# Patient Record
Sex: Male | Born: 1950
Health system: Southern US, Community
[De-identification: ages and names within clinical notes are randomized; demographics above are authoritative.]

## PROBLEM LIST (undated history)

## (undated) DIAGNOSIS — F1090 Alcohol use, unspecified, uncomplicated: Secondary | ICD-10-CM

## (undated) DIAGNOSIS — I493 Ventricular premature depolarization: Secondary | ICD-10-CM

## (undated) DIAGNOSIS — C3490 Malignant neoplasm of unspecified part of unspecified bronchus or lung: Secondary | ICD-10-CM

## (undated) DIAGNOSIS — J42 Unspecified chronic bronchitis: Secondary | ICD-10-CM

## (undated) DIAGNOSIS — I2510 Atherosclerotic heart disease of native coronary artery without angina pectoris: Secondary | ICD-10-CM

## (undated) DIAGNOSIS — Z87442 Personal history of urinary calculi: Secondary | ICD-10-CM

## (undated) DIAGNOSIS — E785 Hyperlipidemia, unspecified: Secondary | ICD-10-CM

## (undated) DIAGNOSIS — I10 Essential (primary) hypertension: Secondary | ICD-10-CM

## (undated) DIAGNOSIS — G43109 Migraine with aura, not intractable, without status migrainosus: Secondary | ICD-10-CM

## (undated) DIAGNOSIS — I5032 Chronic diastolic (congestive) heart failure: Secondary | ICD-10-CM

## (undated) HISTORY — DX: Chronic diastolic (congestive) heart failure: I50.32

## (undated) HISTORY — DX: Essential (primary) hypertension: I10

## (undated) HISTORY — DX: Alcohol use, unspecified, uncomplicated: F10.90

## (undated) HISTORY — DX: Atherosclerotic heart disease of native coronary artery without angina pectoris: I25.10

## (undated) HISTORY — DX: Ventricular premature depolarization: I49.3

## (undated) HISTORY — DX: Malignant neoplasm of unspecified part of unspecified bronchus or lung: C34.90

## (undated) HISTORY — DX: Hyperlipidemia, unspecified: E78.5

---

## 2001-09-19 ENCOUNTER — Ambulatory Visit (HOSPITAL_COMMUNITY): Admission: RE | Admit: 2001-09-19 | Discharge: 2001-09-19 | Payer: Self-pay | Admitting: Internal Medicine

## 2001-09-19 ENCOUNTER — Encounter: Payer: Self-pay | Admitting: Internal Medicine

## 2002-12-01 ENCOUNTER — Ambulatory Visit (HOSPITAL_COMMUNITY): Admission: RE | Admit: 2002-12-01 | Discharge: 2002-12-01 | Payer: Self-pay | Admitting: Internal Medicine

## 2002-12-02 ENCOUNTER — Encounter: Payer: Self-pay | Admitting: Internal Medicine

## 2002-12-03 ENCOUNTER — Ambulatory Visit (HOSPITAL_COMMUNITY): Admission: RE | Admit: 2002-12-03 | Discharge: 2002-12-03 | Payer: Self-pay | Admitting: Internal Medicine

## 2003-04-13 ENCOUNTER — Encounter: Payer: Self-pay | Admitting: Internal Medicine

## 2003-04-13 ENCOUNTER — Ambulatory Visit (HOSPITAL_COMMUNITY): Admission: RE | Admit: 2003-04-13 | Discharge: 2003-04-13 | Payer: Self-pay | Admitting: Internal Medicine

## 2004-04-10 ENCOUNTER — Emergency Department (HOSPITAL_COMMUNITY): Admission: EM | Admit: 2004-04-10 | Discharge: 2004-04-10 | Payer: Self-pay | Admitting: *Deleted

## 2005-08-13 ENCOUNTER — Emergency Department (HOSPITAL_COMMUNITY): Admission: EM | Admit: 2005-08-13 | Discharge: 2005-08-13 | Payer: Self-pay | Admitting: Emergency Medicine

## 2006-10-08 HISTORY — PX: OTHER SURGICAL HISTORY: SHX169

## 2007-05-14 ENCOUNTER — Ambulatory Visit (HOSPITAL_COMMUNITY): Admission: RE | Admit: 2007-05-14 | Discharge: 2007-05-14 | Payer: Self-pay | Admitting: Internal Medicine

## 2007-05-16 ENCOUNTER — Ambulatory Visit (HOSPITAL_COMMUNITY): Admission: RE | Admit: 2007-05-16 | Discharge: 2007-05-16 | Payer: Self-pay | Admitting: Internal Medicine

## 2007-05-21 ENCOUNTER — Ambulatory Visit: Payer: Self-pay | Admitting: Thoracic Surgery

## 2007-05-23 ENCOUNTER — Ambulatory Visit (HOSPITAL_COMMUNITY): Admission: RE | Admit: 2007-05-23 | Discharge: 2007-05-23 | Payer: Self-pay | Admitting: Thoracic Surgery

## 2007-05-27 ENCOUNTER — Encounter: Payer: Self-pay | Admitting: Thoracic Surgery

## 2007-05-27 ENCOUNTER — Ambulatory Visit (HOSPITAL_COMMUNITY): Admission: RE | Admit: 2007-05-27 | Discharge: 2007-05-27 | Payer: Self-pay | Admitting: Thoracic Surgery

## 2007-05-27 ENCOUNTER — Ambulatory Visit: Payer: Self-pay | Admitting: Thoracic Surgery

## 2007-05-28 ENCOUNTER — Ambulatory Visit: Payer: Self-pay | Admitting: Thoracic Surgery

## 2007-05-30 ENCOUNTER — Ambulatory Visit (HOSPITAL_COMMUNITY): Payer: Self-pay | Admitting: Oncology

## 2007-05-30 ENCOUNTER — Encounter (HOSPITAL_COMMUNITY): Admission: RE | Admit: 2007-05-30 | Discharge: 2007-06-29 | Payer: Self-pay | Admitting: Oncology

## 2007-06-19 ENCOUNTER — Ambulatory Visit (HOSPITAL_COMMUNITY): Admission: RE | Admit: 2007-06-19 | Discharge: 2007-06-19 | Payer: Self-pay | Admitting: Thoracic Surgery

## 2007-06-19 ENCOUNTER — Ambulatory Visit: Payer: Self-pay | Admitting: Thoracic Surgery

## 2007-07-04 ENCOUNTER — Ambulatory Visit: Admission: RE | Admit: 2007-07-04 | Discharge: 2007-08-05 | Payer: Self-pay | Admitting: Radiation Oncology

## 2007-07-04 ENCOUNTER — Encounter (HOSPITAL_COMMUNITY): Admission: RE | Admit: 2007-07-04 | Discharge: 2007-07-08 | Payer: Self-pay | Admitting: Oncology

## 2007-07-21 ENCOUNTER — Ambulatory Visit (HOSPITAL_COMMUNITY): Payer: Self-pay | Admitting: Oncology

## 2007-07-21 ENCOUNTER — Encounter (HOSPITAL_COMMUNITY): Admission: RE | Admit: 2007-07-21 | Discharge: 2007-08-20 | Payer: Self-pay | Admitting: Family Medicine

## 2007-08-22 ENCOUNTER — Ambulatory Visit: Admission: RE | Admit: 2007-08-22 | Discharge: 2007-10-08 | Payer: Self-pay | Admitting: Radiation Oncology

## 2007-08-25 ENCOUNTER — Ambulatory Visit (HOSPITAL_COMMUNITY): Admission: RE | Admit: 2007-08-25 | Discharge: 2007-08-25 | Payer: Self-pay | Admitting: Oncology

## 2007-08-26 ENCOUNTER — Encounter (HOSPITAL_COMMUNITY): Admission: RE | Admit: 2007-08-26 | Discharge: 2007-09-25 | Payer: Self-pay | Admitting: Oncology

## 2007-09-08 ENCOUNTER — Ambulatory Visit (HOSPITAL_COMMUNITY): Payer: Self-pay | Admitting: Oncology

## 2007-09-09 LAB — CBC WITH DIFFERENTIAL/PLATELET
Basophils Absolute: 0 10*3/uL (ref 0.0–0.1)
EOS%: 0 % (ref 0.0–7.0)
Eosinophils Absolute: 0 10*3/uL (ref 0.0–0.5)
LYMPH%: 3.2 % — ABNORMAL LOW (ref 14.0–48.0)
MCH: 32.3 pg (ref 28.0–33.4)
MCV: 93.6 fL (ref 81.6–98.0)
MONO%: 2.7 % (ref 0.0–13.0)
NEUT#: 14.9 10*3/uL — ABNORMAL HIGH (ref 1.5–6.5)
Platelets: 486 10*3/uL — ABNORMAL HIGH (ref 145–400)
RBC: 3.41 10*6/uL — ABNORMAL LOW (ref 4.20–5.71)
RDW: 22.8 % — ABNORMAL HIGH (ref 11.2–14.6)

## 2007-09-16 LAB — CBC WITH DIFFERENTIAL/PLATELET
BASO%: 0.6 % (ref 0.0–2.0)
EOS%: 0.2 % (ref 0.0–7.0)
LYMPH%: 27.2 % (ref 14.0–48.0)
MCHC: 34.8 g/dL (ref 32.0–35.9)
MCV: 93.2 fL (ref 81.6–98.0)
MONO%: 1.6 % (ref 0.0–13.0)
Platelets: 76 10*3/uL — ABNORMAL LOW (ref 145–400)
RBC: 3.03 10*6/uL — ABNORMAL LOW (ref 4.20–5.71)
RDW: 20.7 % — ABNORMAL HIGH (ref 11.2–14.6)

## 2007-09-23 LAB — CBC WITH DIFFERENTIAL/PLATELET
BASO%: 0.1 % (ref 0.0–2.0)
LYMPH%: 7.6 % — ABNORMAL LOW (ref 14.0–48.0)
MCHC: 35.4 g/dL (ref 32.0–35.9)
MONO#: 0.6 10*3/uL (ref 0.1–0.9)
NEUT#: 5.7 10*3/uL (ref 1.5–6.5)
RBC: 2.48 10*6/uL — ABNORMAL LOW (ref 4.20–5.71)
RDW: 20.4 % — ABNORMAL HIGH (ref 11.2–14.6)
WBC: 6.9 10*3/uL (ref 4.0–10.0)
lymph#: 0.5 10*3/uL — ABNORMAL LOW (ref 0.9–3.3)

## 2007-09-29 ENCOUNTER — Encounter (HOSPITAL_COMMUNITY): Admission: RE | Admit: 2007-09-29 | Discharge: 2007-10-08 | Payer: Self-pay | Admitting: Oncology

## 2007-09-30 LAB — CBC WITH DIFFERENTIAL/PLATELET
Basophils Absolute: 0 10*3/uL (ref 0.0–0.1)
HCT: 29.1 % — ABNORMAL LOW (ref 38.7–49.9)
HGB: 10.1 g/dL — ABNORMAL LOW (ref 13.0–17.1)
MONO#: 0.7 10*3/uL (ref 0.1–0.9)
NEUT%: 81.7 % — ABNORMAL HIGH (ref 40.0–75.0)
WBC: 6.9 10*3/uL (ref 4.0–10.0)
lymph#: 0.5 10*3/uL — ABNORMAL LOW (ref 0.9–3.3)

## 2007-10-07 LAB — CBC WITH DIFFERENTIAL/PLATELET
Basophils Absolute: 0 10*3/uL (ref 0.0–0.1)
EOS%: 0.2 % (ref 0.0–7.0)
HCT: 35.1 % — ABNORMAL LOW (ref 38.7–49.9)
HGB: 11.8 g/dL — ABNORMAL LOW (ref 13.0–17.1)
MCH: 32.4 pg (ref 28.0–33.4)
MCV: 96.4 fL (ref 81.6–98.0)
NEUT%: 82.5 % — ABNORMAL HIGH (ref 40.0–75.0)
lymph#: 0.6 10*3/uL — ABNORMAL LOW (ref 0.9–3.3)

## 2007-10-09 ENCOUNTER — Ambulatory Visit: Admission: RE | Admit: 2007-10-09 | Discharge: 2007-11-02 | Payer: Self-pay | Admitting: Radiation Oncology

## 2007-10-13 ENCOUNTER — Encounter (HOSPITAL_COMMUNITY): Admission: RE | Admit: 2007-10-13 | Discharge: 2007-11-12 | Payer: Self-pay | Admitting: Oncology

## 2007-10-14 LAB — CBC WITH DIFFERENTIAL/PLATELET
Basophils Absolute: 0 10*3/uL (ref 0.0–0.1)
EOS%: 0 % (ref 0.0–7.0)
HGB: 11.8 g/dL — ABNORMAL LOW (ref 13.0–17.1)
LYMPH%: 1.2 % — ABNORMAL LOW (ref 14.0–48.0)
MCH: 32.4 pg (ref 28.0–33.4)
MCV: 96.4 fL (ref 81.6–98.0)
MONO%: 1.9 % (ref 0.0–13.0)
NEUT%: 96.9 % — ABNORMAL HIGH (ref 40.0–75.0)
RDW: 20.5 % — ABNORMAL HIGH (ref 11.2–14.6)

## 2007-10-24 ENCOUNTER — Ambulatory Visit (HOSPITAL_COMMUNITY): Payer: Self-pay | Admitting: Oncology

## 2007-11-27 ENCOUNTER — Encounter (HOSPITAL_COMMUNITY): Admission: RE | Admit: 2007-11-27 | Discharge: 2007-12-27 | Payer: Self-pay | Admitting: Oncology

## 2007-12-11 ENCOUNTER — Ambulatory Visit (HOSPITAL_COMMUNITY): Payer: Self-pay | Admitting: Oncology

## 2008-01-26 ENCOUNTER — Encounter (HOSPITAL_COMMUNITY): Admission: RE | Admit: 2008-01-26 | Discharge: 2008-02-25 | Payer: Self-pay | Admitting: Oncology

## 2008-01-26 ENCOUNTER — Ambulatory Visit (HOSPITAL_COMMUNITY): Payer: Self-pay | Admitting: Oncology

## 2008-02-06 ENCOUNTER — Ambulatory Visit: Admission: RE | Admit: 2008-02-06 | Discharge: 2008-04-20 | Payer: Self-pay | Admitting: Radiation Oncology

## 2008-02-12 ENCOUNTER — Ambulatory Visit (HOSPITAL_COMMUNITY): Admission: RE | Admit: 2008-02-12 | Discharge: 2008-02-12 | Payer: Self-pay | Admitting: Radiation Oncology

## 2008-04-21 ENCOUNTER — Encounter (HOSPITAL_COMMUNITY): Admission: RE | Admit: 2008-04-21 | Discharge: 2008-05-21 | Payer: Self-pay | Admitting: Oncology

## 2008-04-21 ENCOUNTER — Ambulatory Visit (HOSPITAL_COMMUNITY): Payer: Self-pay | Admitting: Oncology

## 2008-06-02 ENCOUNTER — Ambulatory Visit (HOSPITAL_COMMUNITY): Admission: RE | Admit: 2008-06-02 | Discharge: 2008-06-02 | Payer: Self-pay | Admitting: Radiation Oncology

## 2008-06-08 ENCOUNTER — Ambulatory Visit (HOSPITAL_COMMUNITY): Admission: RE | Admit: 2008-06-08 | Discharge: 2008-06-08 | Payer: Self-pay | Admitting: Radiation Oncology

## 2008-06-11 ENCOUNTER — Ambulatory Visit (HOSPITAL_COMMUNITY): Payer: Self-pay | Admitting: Oncology

## 2008-07-23 ENCOUNTER — Encounter (HOSPITAL_COMMUNITY): Admission: RE | Admit: 2008-07-23 | Discharge: 2008-08-22 | Payer: Self-pay | Admitting: Oncology

## 2008-08-18 ENCOUNTER — Ambulatory Visit (HOSPITAL_COMMUNITY): Payer: Self-pay | Admitting: Oncology

## 2008-09-27 ENCOUNTER — Encounter (HOSPITAL_COMMUNITY): Admission: RE | Admit: 2008-09-27 | Discharge: 2008-10-27 | Payer: Self-pay | Admitting: Oncology

## 2008-10-13 ENCOUNTER — Ambulatory Visit (HOSPITAL_COMMUNITY): Payer: Self-pay | Admitting: Oncology

## 2008-11-08 ENCOUNTER — Encounter (HOSPITAL_COMMUNITY): Admission: RE | Admit: 2008-11-08 | Discharge: 2008-12-08 | Payer: Self-pay | Admitting: Oncology

## 2009-01-15 IMAGING — CR DG CHEST 2V
2 series · 2 of 2 positions shown · non-contrast
Comparison: CT chest 05/16/07 and chest x-ray 05/14/07.

CLINICAL DATA: Preadmission for lung mass.
 CHEST - 2 VIEW:

[view not recorded (1 of 2)]
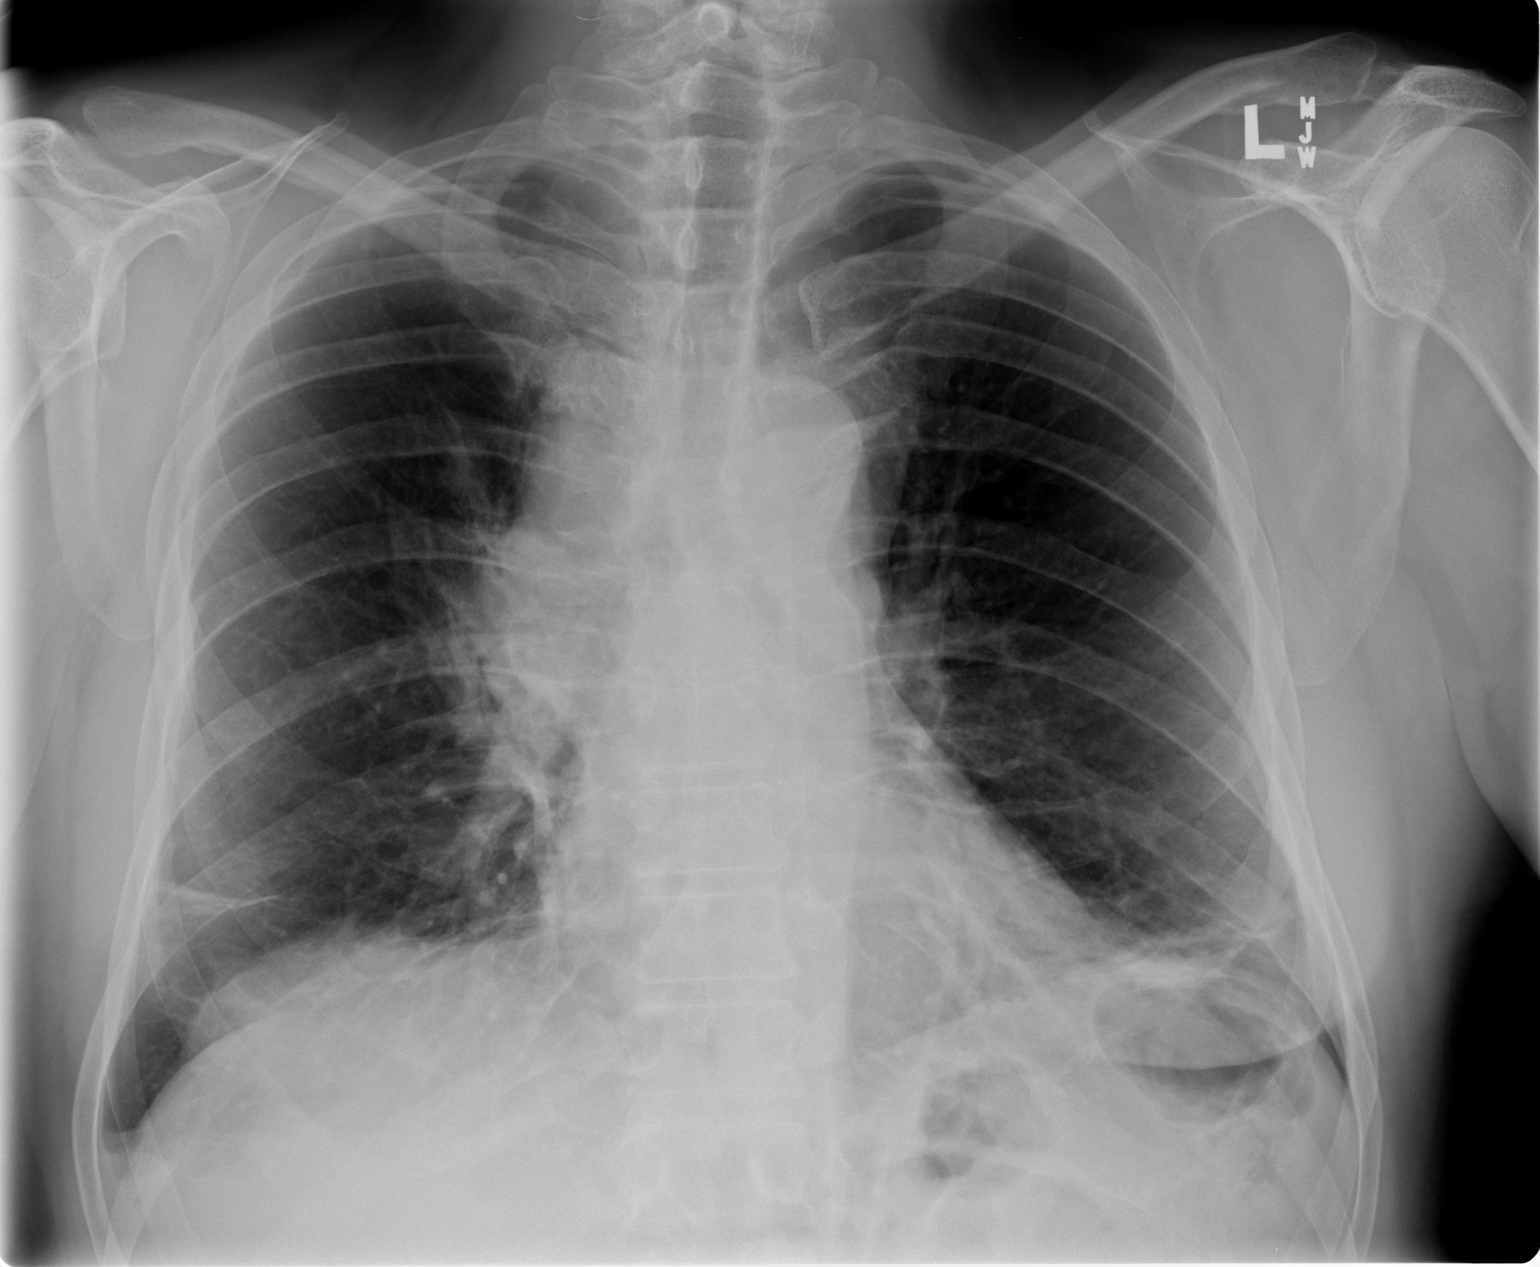

[view not recorded (2 of 2)]
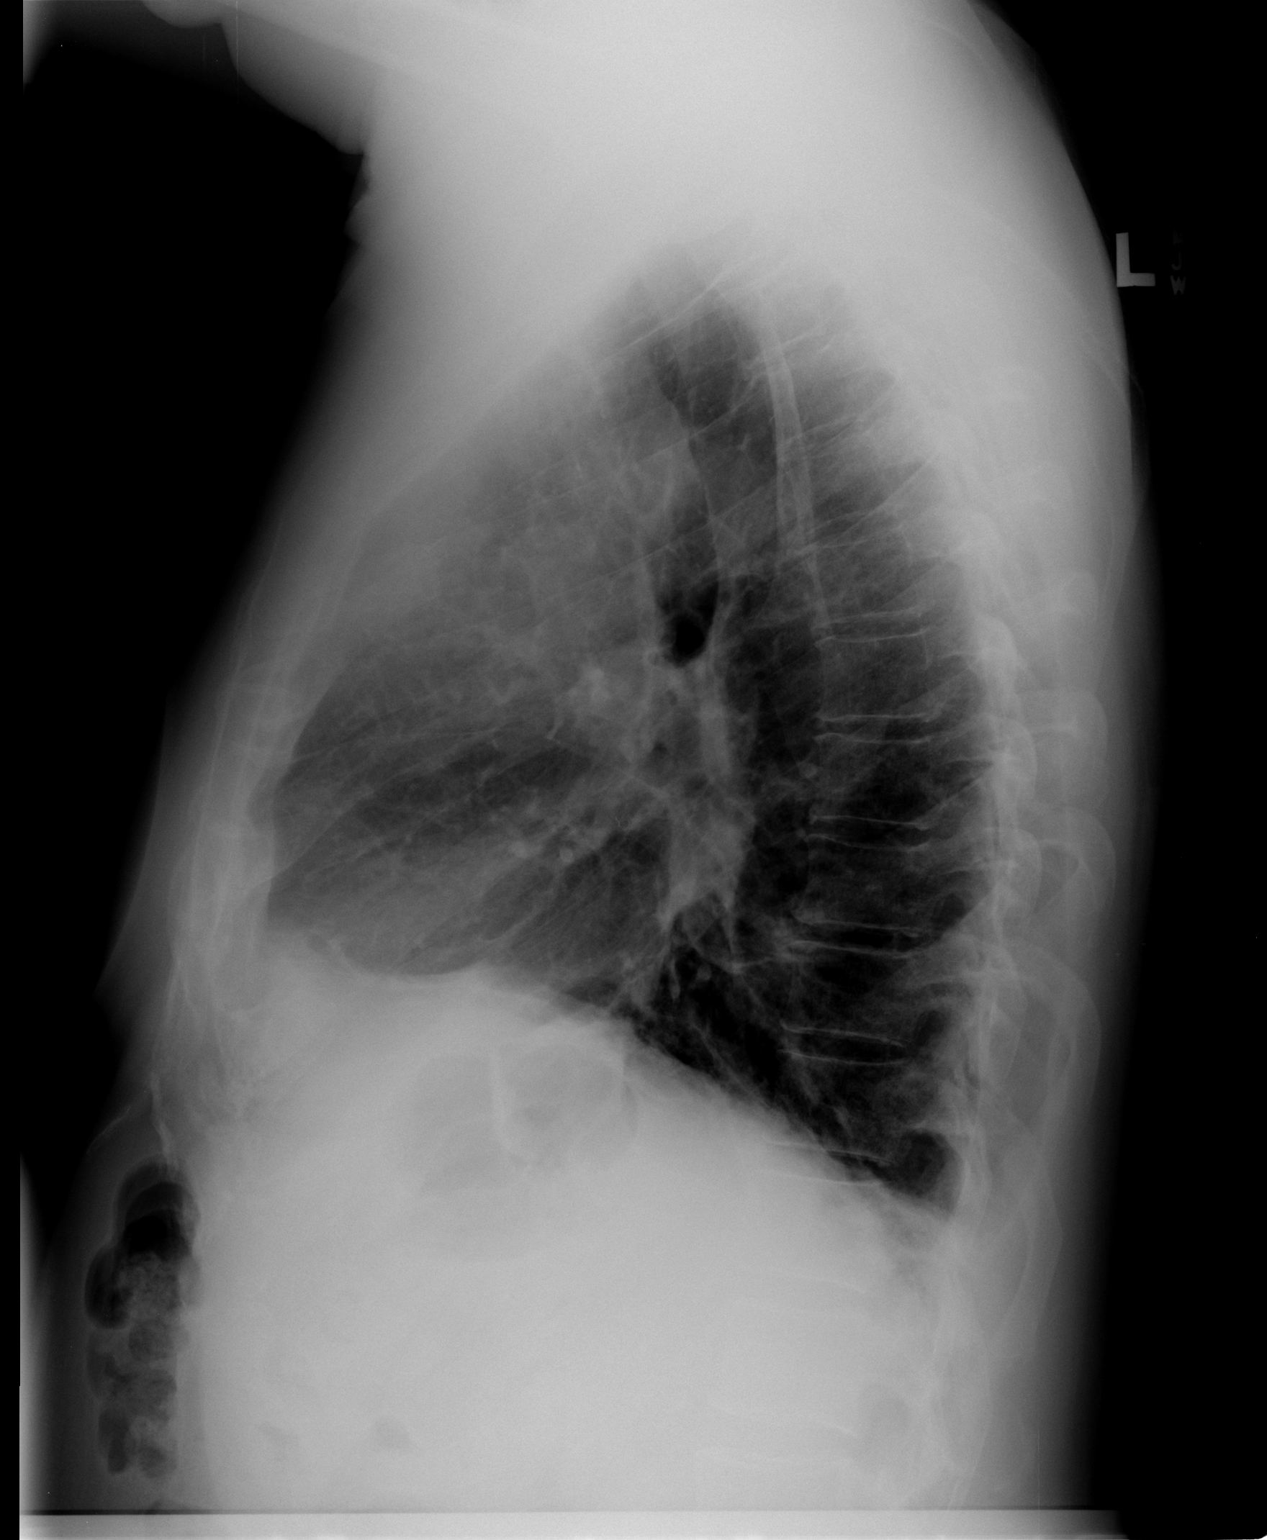

[2 of 2 positions shown; findings below may reference images not displayed]

FINDINGS: Trachea is midline. Heart size stable.  Bulky mediastinal and right hilar adenopathy are seen with a small right upper lobe nodule. Bibasilar atelectasis.
IMPRESSION: Bulky mediastinal and right hilar adenopathy with small right upper lobe nodule, most consistent with small cell carcinoma.

## 2009-02-07 IMAGING — CR DG CHEST 2V
2 series · 2 of 2 positions shown · non-contrast
Comparison: 05/26/07.

CLINICAL DATA: Lung carcinoma.  Preop respiratory exam for Port-A-Cath placement. 
 CHEST - 2 VIEW:

[view not recorded (1 of 2)]
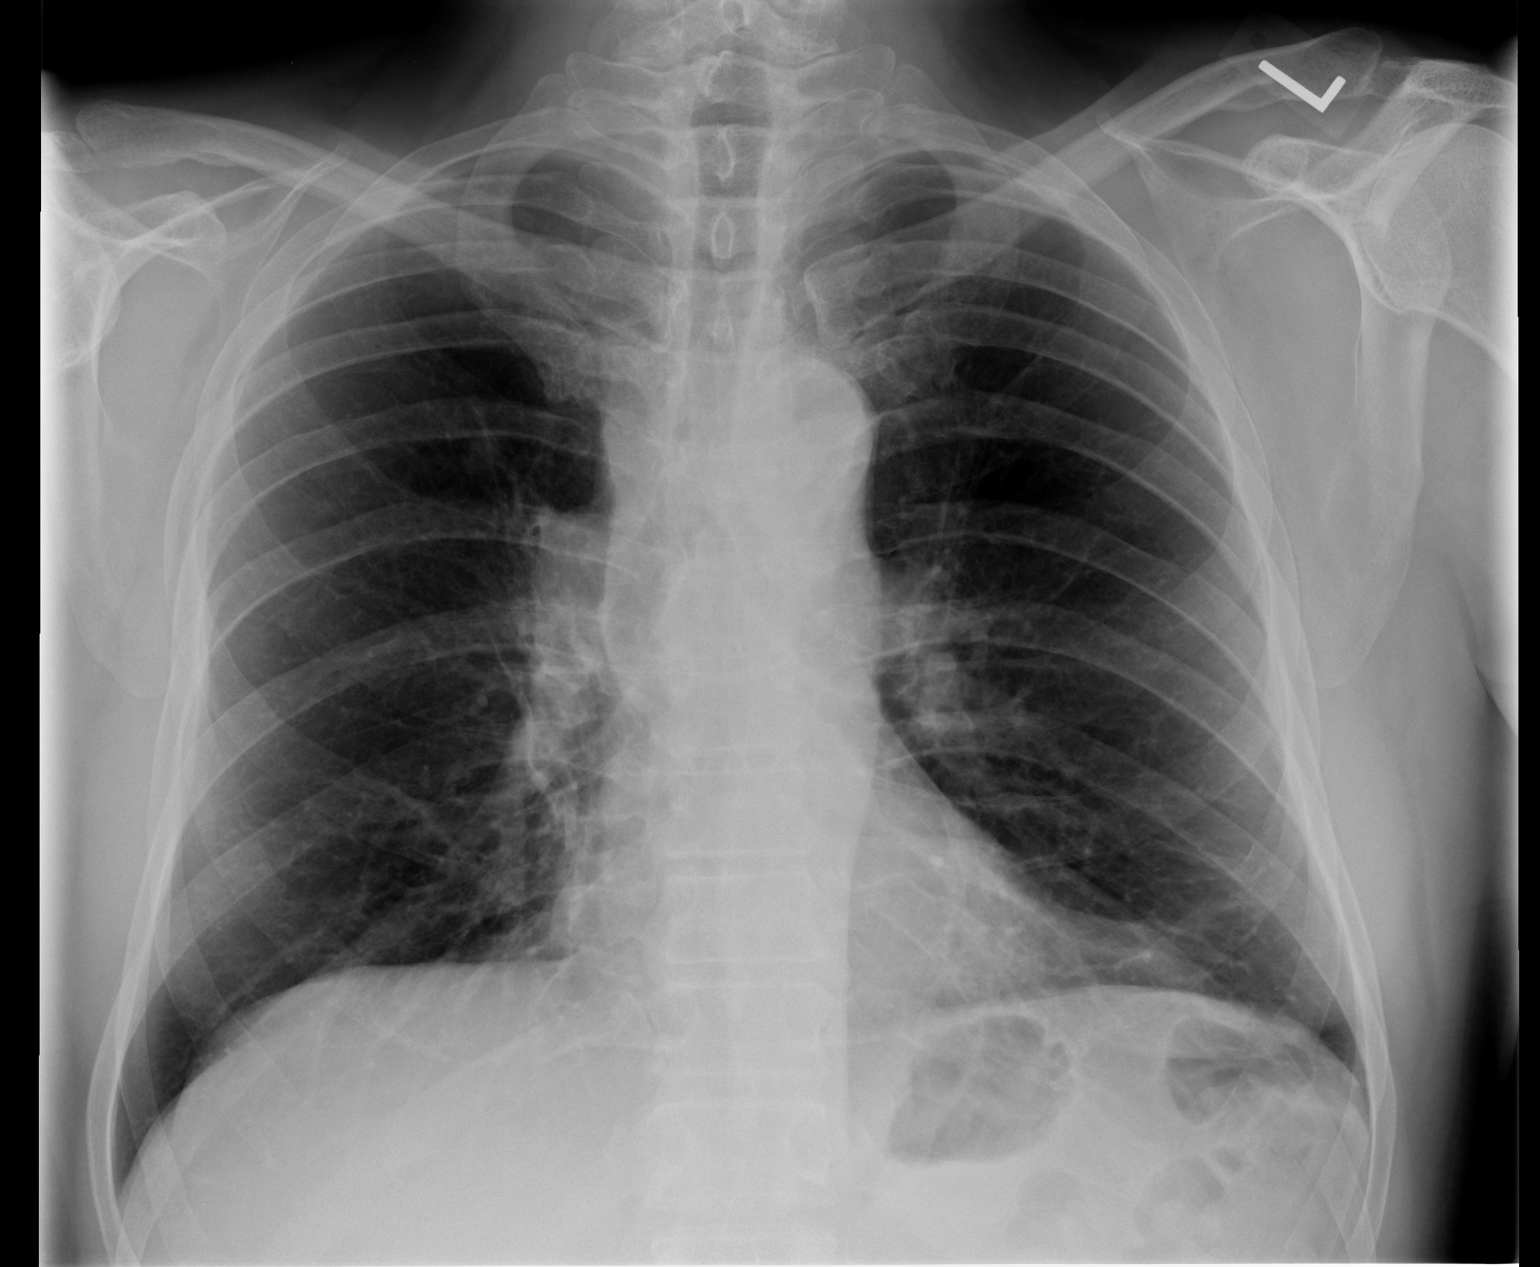

[view not recorded (2 of 2)]
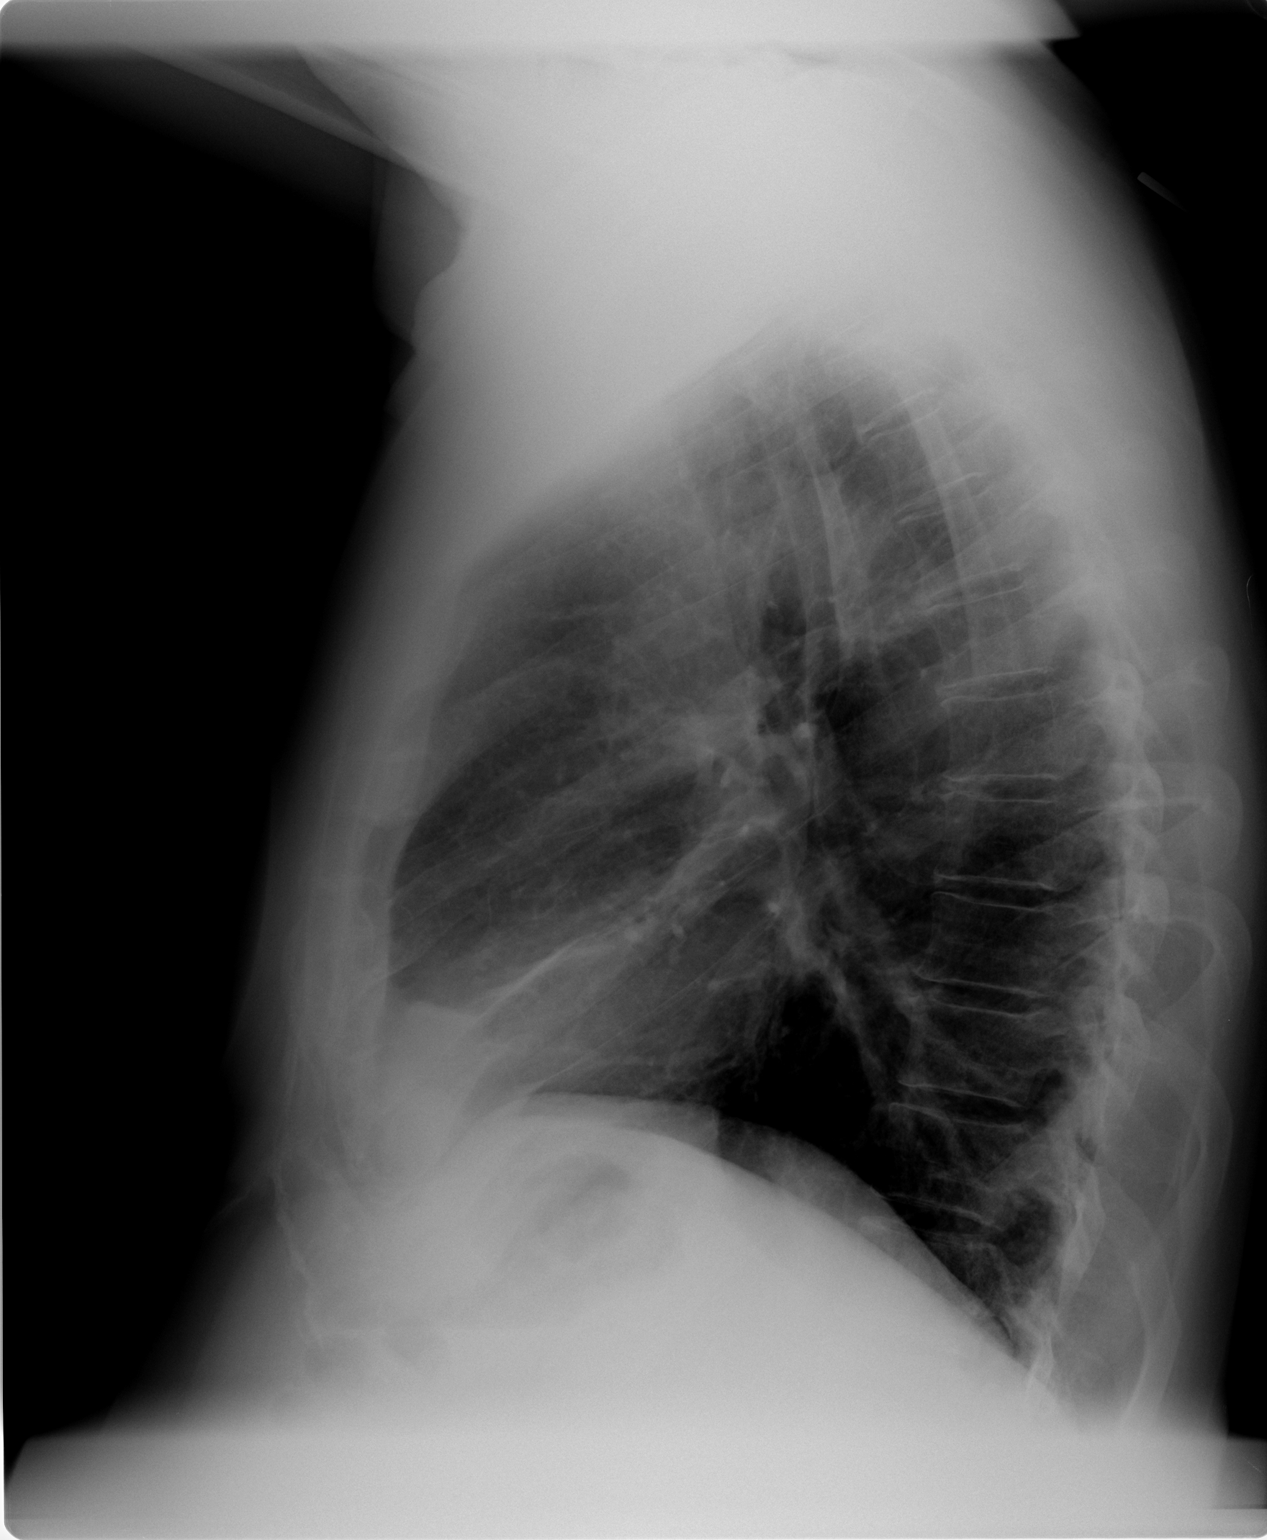

[2 of 2 positions shown; findings below may reference images not displayed]

FINDINGS: Significant decrease in right paratracheal and right hilar adenopathy is seen since prior study.  Decrease in atelectasis is seen in both lung bases.  No new or worsening areas of pulmonary opacity are seen.  There is no evidence of pleural effusion.  Heart size is normal.
IMPRESSION: 1.  No acute findings. 
 2.  Decreased right paratracheal and hilar adenopathy since prior study. 
 3.  Decreased bibasilar atelectasis.

## 2009-03-21 ENCOUNTER — Ambulatory Visit (HOSPITAL_COMMUNITY): Payer: Self-pay | Admitting: Oncology

## 2009-04-16 IMAGING — PT NM PET TUM IMG SKULL BASE T - THIGH
6 series · 25 of 25 positions shown · non-contrast
Comparison: 05/23/07.

CLINICAL DATA: Lung cancer. 
 FDG PET-CT TUMOR IMAGING (SKULL BASE TO THIGHS):
 Fasting Blood Glucose:  106.
TECHNIQUE: 16.2 mCi F-18 FDG were administered via right antecubital fossa.  Full ring PET imaging was performed from the skull base through the mid-thighs 70 minutes after injection.  CT data was obtained and used for attenuation correction and anatomic localization only.  (This was not acquired as a diagnostic CT examination.)

[Series 1: pet ac · axial · 3.3mm · 4.69mm/px · z∈[-876,-6]mm · 5 of 266 slices shown]
[im 1/266]
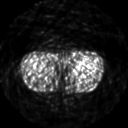
[im 67/266]
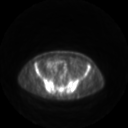
[im 133/266]
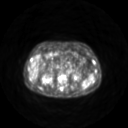
[im 199/266]
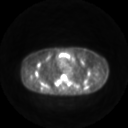
[im 266/266]
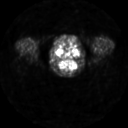

[Series 2: ct images · axial · 3.8mm · 0.98mm/px · z∈[-876,-6]mm · 6 of 267 slices shown]
[im 1/267]
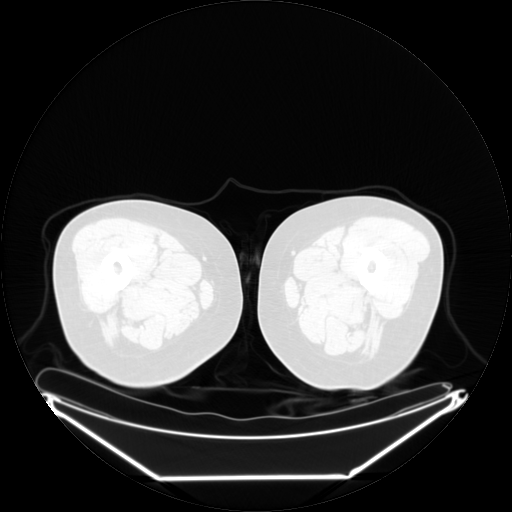
[im 54/267]
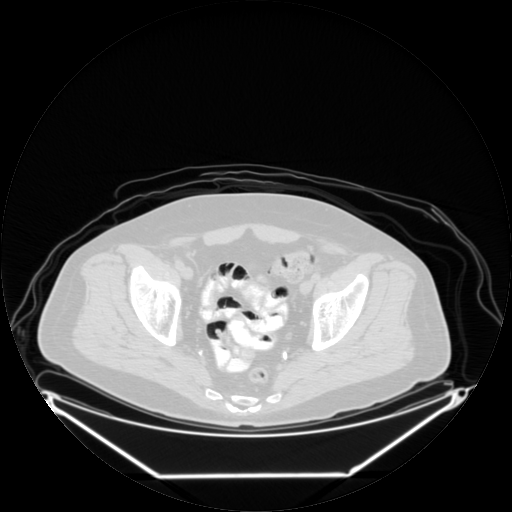
[im 107/267]
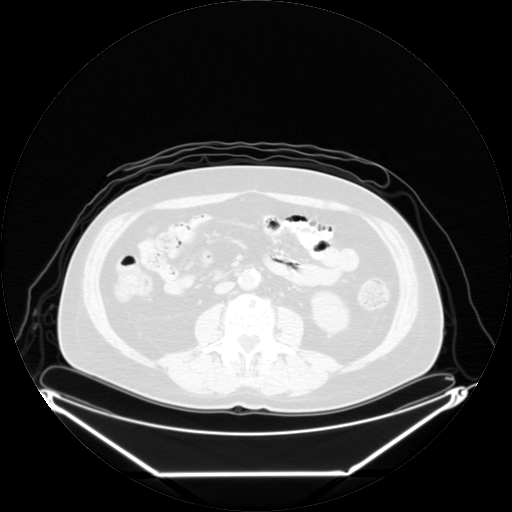
[im 160/267]
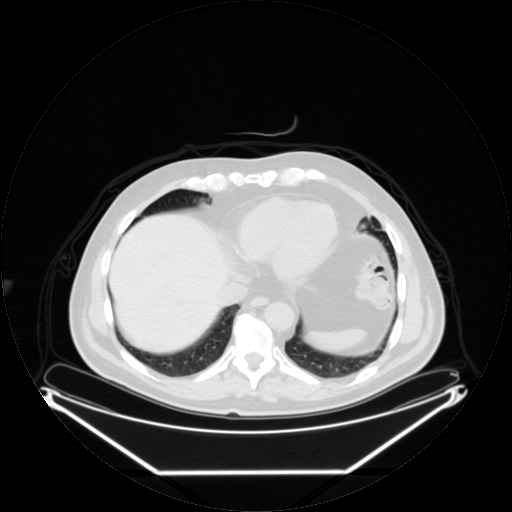
[im 213/267]
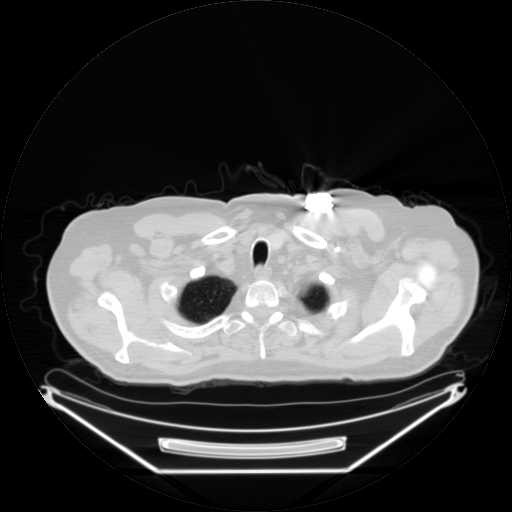
[im 267/267]
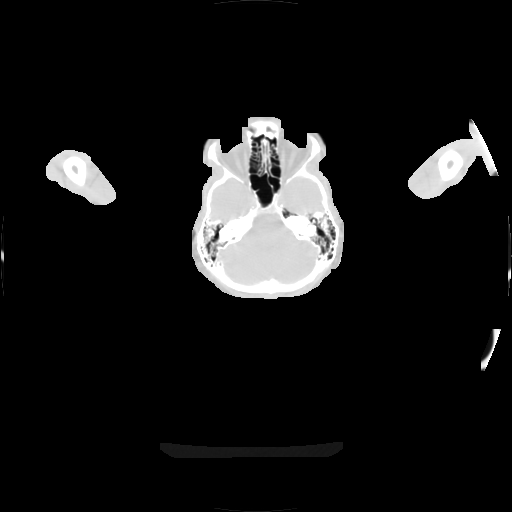

[Series 2: pet nac · axial · 3.3mm · 4.69mm/px · z∈[-876,-6]mm · 6 of 265 slices shown]
[im 1/265]
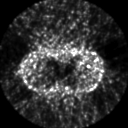
[im 53/265]
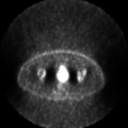
[im 106/265]
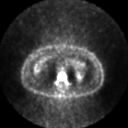
[im 159/265]
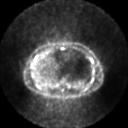
[im 212/265]
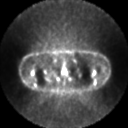
[im 265/265]
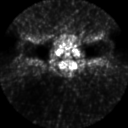

[Series 123: mip · coronal · 3.3mm · 4.69mm/px · 1 of 30 slices shown]
[im 1/30]
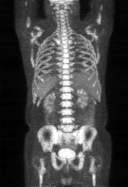

[Series 151: reformatted · axial · 3.3mm · 3.91mm/px · z∈[-876,-6]mm · 6 of 265 slices shown (1 of 2)]
[im 1/265]
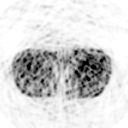
[im 53/265]
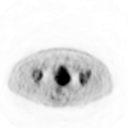
[im 106/265]
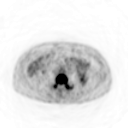
[im 159/265]
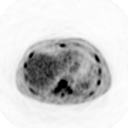
[im 212/265]
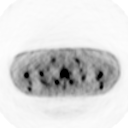
[im 265/265]
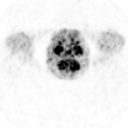

[Series 153: reformatted · coronal · 4.7mm · 6.98mm/px · 1 of 68 slices shown (2 of 2)]
[im 1/68]
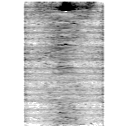

[25 of 25 positions shown; findings below may reference images not displayed]

FINDINGS: Pretracheal lymph node measures 11.7 x 17.8 mm and has an SUV max equal to 2.0 g/ml (image #74).  Previously this lymph node measured 4.4 x 3.8 cm and had an SUV max equal to 14.1 g/ml. 
 No hypermetabolic lymph nodes within the soft tissues of the neck. 
 There are no hypermetabolic right or left axillary lymph nodes.
 There are no hypermetabolic mediastinal or hilar lymph nodes.  
 No pericardial or pleural fluid is identified.  
 Right upper lobe pulmonary nodule measures 4 mm, image #70.  This is decreased from 13.7 mm previously.  The SUV max within this nodule is equal to 0.8 g/ml.  This is compared with 6.8 g/ml previously.  
 There are no new or enlarging pulmonary nodules rule out masses.
 No abnormal FDG uptake is identified within the liver parenchyma.  
 The spleen is normal. 
 There is no abnormal uptake in the adrenal glands or pancreas. 
 No hypermetabolic retroperitoneal or small bowel mesenteric lymph nodes are identified.  
 There are no hypermetabolic pelvic or inguinal lymph nodes.
 Bowel loops of the upper abdomen are unremarkable.  
 There is diffuse increased radiotracer uptake throughout the axial and appendicular skeleton which is likely treatment related.  This may decrease the sensitivity for detecting subtle areas of hypermetabolic bone metastases.
 Stable lucent lesion within the L3 vertebra may represent a small hemangioma.
IMPRESSION: 1.  There has been marked interval metabolic response to therapy from the PET CT dated 05/23/07.  Specifically, there has been near complete resolution of hypermetabolic mediastinal lymph nodes.  Only borderline enlarged mediastinal lymph nodes remain on the current exam.  The degree of FDG uptake is below background activity.  Also, residual 4 mm nodule in the right upper lobe exhibits no significant increased uptake above background activity.  
 2.  Diffuse marrow activity is likely treatment related.  Note that this may decrease our sensitivity for detecting hypermetabolic bone metastases.

## 2009-06-28 ENCOUNTER — Encounter (HOSPITAL_COMMUNITY): Admission: RE | Admit: 2009-06-28 | Discharge: 2009-07-07 | Payer: Self-pay | Admitting: Internal Medicine

## 2009-06-30 ENCOUNTER — Ambulatory Visit: Payer: Self-pay | Admitting: Cardiology

## 2009-06-30 ENCOUNTER — Encounter: Payer: Self-pay | Admitting: Adult Health

## 2009-06-30 ENCOUNTER — Observation Stay (HOSPITAL_COMMUNITY): Admission: EM | Admit: 2009-06-30 | Discharge: 2009-07-01 | Payer: Self-pay | Admitting: Emergency Medicine

## 2009-06-30 LAB — CONVERTED CEMR LAB
BUN: 14 mg/dL
CK-MB: <1 ng/mL
CO2: 27 meq/L
Creatinine, Ser: 1.13 mg/dL
Glucose, Bld: 105 mg/dL
Hemoglobin: 13.9 g/dL
Platelets: 322 10*3/uL
WBC: 5.8 10*3/uL

## 2009-07-01 ENCOUNTER — Encounter: Payer: Self-pay | Admitting: Adult Health

## 2009-07-01 LAB — CONVERTED CEMR LAB
Chloride: 103 meq/L
Creatinine, Ser: 1.2 mg/dL
HDL: 34 mg/dL
LDL Cholesterol: 144 mg/dL
Platelets: 317 10*3/uL
Troponin I: <0.05 ng/mL
WBC: 7.3 10*3/uL

## 2009-07-08 DIAGNOSIS — I10 Essential (primary) hypertension: Secondary | ICD-10-CM

## 2009-07-13 ENCOUNTER — Encounter: Payer: Self-pay | Admitting: Adult Health

## 2009-07-13 ENCOUNTER — Ambulatory Visit: Payer: Self-pay | Admitting: Cardiology

## 2009-07-27 ENCOUNTER — Encounter (HOSPITAL_COMMUNITY): Admission: RE | Admit: 2009-07-27 | Discharge: 2009-08-26 | Payer: Self-pay | Admitting: Oncology

## 2009-07-27 ENCOUNTER — Ambulatory Visit (HOSPITAL_COMMUNITY): Payer: Self-pay | Admitting: Oncology

## 2009-08-16 ENCOUNTER — Encounter: Payer: Self-pay | Admitting: Adult Health

## 2009-08-16 ENCOUNTER — Ambulatory Visit: Payer: Self-pay | Admitting: Cardiology

## 2009-08-16 DIAGNOSIS — E782 Mixed hyperlipidemia: Secondary | ICD-10-CM

## 2009-09-15 ENCOUNTER — Encounter (INDEPENDENT_AMBULATORY_CARE_PROVIDER_SITE_OTHER): Payer: Self-pay | Admitting: *Deleted

## 2009-09-15 ENCOUNTER — Encounter: Payer: Self-pay | Admitting: Adult Health

## 2009-09-15 LAB — CONVERTED CEMR LAB
ALT: 11 units/L
AST: 11 units/L
AST: 11 units/L
Alkaline Phosphatase: 81 units/L
Alkaline Phosphatase: 81 units/L (ref 39–117)
Bilirubin, Direct: 0.1 mg/dL
Bilirubin, Direct: 0.1 mg/dL (ref 0.0–0.3)
HDL: 41 mg/dL
HDL: 41 mg/dL
Indirect Bilirubin: 0.4 mg/dL (ref 0.0–0.9)
LDL Cholesterol: 40 mg/dL
LDL Cholesterol: 40 mg/dL
LDL Cholesterol: 40 mg/dL (ref 0–99)
Total Bilirubin: 0.5 mg/dL (ref 0.3–1.2)
Triglycerides: 89 mg/dL (ref <150)

## 2009-09-19 ENCOUNTER — Encounter: Payer: Self-pay | Admitting: Adult Health

## 2009-09-19 ENCOUNTER — Encounter (INDEPENDENT_AMBULATORY_CARE_PROVIDER_SITE_OTHER): Payer: Self-pay | Admitting: *Deleted

## 2010-01-18 ENCOUNTER — Encounter (HOSPITAL_COMMUNITY): Admission: RE | Admit: 2010-01-18 | Discharge: 2010-02-17 | Payer: Self-pay | Admitting: Oncology

## 2010-01-30 ENCOUNTER — Encounter (INDEPENDENT_AMBULATORY_CARE_PROVIDER_SITE_OTHER): Payer: Self-pay | Admitting: *Deleted

## 2010-02-05 DIAGNOSIS — I2510 Atherosclerotic heart disease of native coronary artery without angina pectoris: Secondary | ICD-10-CM | POA: Insufficient documentation

## 2010-02-05 DIAGNOSIS — C3490 Malignant neoplasm of unspecified part of unspecified bronchus or lung: Secondary | ICD-10-CM | POA: Insufficient documentation

## 2010-02-06 ENCOUNTER — Encounter (INDEPENDENT_AMBULATORY_CARE_PROVIDER_SITE_OTHER): Payer: Self-pay | Admitting: *Deleted

## 2010-02-06 ENCOUNTER — Ambulatory Visit: Payer: Self-pay | Admitting: Cardiology

## 2010-02-07 ENCOUNTER — Encounter: Payer: Self-pay | Admitting: Cardiology

## 2010-02-14 ENCOUNTER — Ambulatory Visit: Payer: Self-pay | Admitting: Cardiology

## 2010-02-14 ENCOUNTER — Encounter (HOSPITAL_COMMUNITY): Admission: RE | Admit: 2010-02-14 | Discharge: 2010-02-14 | Payer: Self-pay | Admitting: Cardiology

## 2010-02-15 ENCOUNTER — Encounter (INDEPENDENT_AMBULATORY_CARE_PROVIDER_SITE_OTHER): Payer: Self-pay | Admitting: *Deleted

## 2010-02-16 ENCOUNTER — Ambulatory Visit (HOSPITAL_COMMUNITY): Payer: Self-pay | Admitting: Internal Medicine

## 2010-03-14 ENCOUNTER — Encounter (INDEPENDENT_AMBULATORY_CARE_PROVIDER_SITE_OTHER): Payer: Self-pay | Admitting: *Deleted

## 2010-03-30 ENCOUNTER — Ambulatory Visit (HOSPITAL_COMMUNITY): Payer: Self-pay | Admitting: Oncology

## 2010-05-01 ENCOUNTER — Encounter (INDEPENDENT_AMBULATORY_CARE_PROVIDER_SITE_OTHER): Payer: Self-pay | Admitting: *Deleted

## 2010-05-16 ENCOUNTER — Telehealth (INDEPENDENT_AMBULATORY_CARE_PROVIDER_SITE_OTHER): Payer: Self-pay | Admitting: *Deleted

## 2010-06-30 ENCOUNTER — Telehealth (INDEPENDENT_AMBULATORY_CARE_PROVIDER_SITE_OTHER): Payer: Self-pay | Admitting: *Deleted

## 2010-07-06 ENCOUNTER — Telehealth: Payer: Self-pay | Admitting: Adult Health

## 2010-07-12 ENCOUNTER — Telehealth (INDEPENDENT_AMBULATORY_CARE_PROVIDER_SITE_OTHER): Payer: Self-pay | Admitting: *Deleted

## 2010-07-12 ENCOUNTER — Encounter (INDEPENDENT_AMBULATORY_CARE_PROVIDER_SITE_OTHER): Payer: Self-pay | Admitting: *Deleted

## 2010-07-21 ENCOUNTER — Ambulatory Visit (HOSPITAL_COMMUNITY): Admission: RE | Admit: 2010-07-21 | Discharge: 2010-07-21 | Payer: Self-pay | Admitting: Cardiology

## 2010-07-21 LAB — CONVERTED CEMR LAB
BUN: 21 mg/dL (ref 6–23)
Basophils Relative: 0 % (ref 0–1)
Chloride: 104 meq/L (ref 96–112)
Creatinine, Ser: 1.1 mg/dL (ref 0.40–1.50)
Eosinophils Absolute: 0.1 10*3/uL (ref 0.0–0.7)
Eosinophils Relative: 2 % (ref 0–5)
Glucose, Bld: 95 mg/dL (ref 70–99)
HCT: 43.2 % (ref 39.0–52.0)
INR: 1.02 (ref <1.50)
MCHC: 31.5 g/dL (ref 30.0–36.0)
MCV: 99.1 fL (ref 78.0–100.0)
Monocytes Relative: 11 % (ref 3–12)
Neutrophils Relative %: 69 % (ref 43–77)
Platelets: 245 10*3/uL (ref 150–400)
Potassium: 4.5 meq/L (ref 3.5–5.3)
RBC: 4.36 M/uL (ref 4.22–5.81)

## 2010-07-25 ENCOUNTER — Encounter: Payer: Self-pay | Admitting: Adult Health

## 2010-07-25 ENCOUNTER — Inpatient Hospital Stay (HOSPITAL_BASED_OUTPATIENT_CLINIC_OR_DEPARTMENT_OTHER): Admission: RE | Admit: 2010-07-25 | Discharge: 2010-07-25 | Payer: Self-pay | Admitting: Cardiovascular Disease

## 2010-07-25 ENCOUNTER — Ambulatory Visit: Payer: Self-pay | Admitting: Cardiovascular Disease

## 2010-07-25 ENCOUNTER — Encounter (INDEPENDENT_AMBULATORY_CARE_PROVIDER_SITE_OTHER): Payer: Self-pay | Admitting: *Deleted

## 2010-07-27 ENCOUNTER — Encounter: Payer: Self-pay | Admitting: Cardiology

## 2010-07-27 LAB — CONVERTED CEMR LAB
LDL Cholesterol: 38 mg/dL (ref 0–99)
Triglycerides: 100 mg/dL (ref <150)
VLDL: 20 mg/dL (ref 0–40)

## 2010-07-31 ENCOUNTER — Encounter (HOSPITAL_COMMUNITY)
Admission: RE | Admit: 2010-07-31 | Discharge: 2010-08-30 | Payer: Self-pay | Source: Home / Self Care | Admitting: Oncology

## 2010-08-08 ENCOUNTER — Encounter (INDEPENDENT_AMBULATORY_CARE_PROVIDER_SITE_OTHER): Payer: Self-pay | Admitting: *Deleted

## 2010-09-12 ENCOUNTER — Telehealth (INDEPENDENT_AMBULATORY_CARE_PROVIDER_SITE_OTHER): Payer: Self-pay | Admitting: *Deleted

## 2010-09-19 ENCOUNTER — Encounter (INDEPENDENT_AMBULATORY_CARE_PROVIDER_SITE_OTHER): Payer: Self-pay | Admitting: *Deleted

## 2010-09-22 ENCOUNTER — Encounter (HOSPITAL_COMMUNITY)
Admission: RE | Admit: 2010-09-22 | Discharge: 2010-10-22 | Payer: Self-pay | Source: Home / Self Care | Attending: Oncology | Admitting: Oncology

## 2010-10-16 ENCOUNTER — Ambulatory Visit (HOSPITAL_COMMUNITY)
Admission: RE | Admit: 2010-10-16 | Discharge: 2010-11-07 | Payer: Self-pay | Source: Home / Self Care | Attending: Oncology | Admitting: Oncology

## 2010-10-16 ENCOUNTER — Telehealth (INDEPENDENT_AMBULATORY_CARE_PROVIDER_SITE_OTHER): Payer: Self-pay | Admitting: *Deleted

## 2010-10-29 ENCOUNTER — Encounter (HOSPITAL_COMMUNITY): Payer: Self-pay | Admitting: Oncology

## 2010-10-30 ENCOUNTER — Encounter (HOSPITAL_COMMUNITY): Payer: Self-pay | Admitting: Oncology

## 2010-11-07 NOTE — Miscellaneous (Signed)
Summary: LABS LIPIDS,LIVER,09/15/2009  Clinical Lists Changes  Observations: Added new observation of ALBUMIN: 4.7 g/dL (16/07/9603 54:09) Added new observation of PROTEIN, TOT: 6.7 g/dL (81/19/1478 29:56) Added new observation of SGPT (ALT): 11 units/L (09/15/2009 10:36) Added new observation of SGOT (AST): 11 units/L (09/15/2009 10:36) Added new observation of ALK PHOS: 81 units/L (09/15/2009 10:36) Added new observation of BILI DIRECT: 0.1 mg/dL (21/30/8657 84:69) Added new observation of LDL: 40 mg/dL (62/95/2841 32:44) Added new observation of HDL: 41 mg/dL (10/10/7251 66:44) Added new observation of TRIGLYC TOT: 89 mg/dL (03/47/4259 56:38) Added new observation of CHOLESTEROL: 99 mg/dL (75/64/3329 51:88)

## 2010-11-07 NOTE — Letter (Signed)
Summary: [place] Results Engineer, agricultural at [facility]  [address] [place] [address]   Phone: [phone]  Fax: [phone]      Feb 15, 2010 MRN: [id number]   [facility] [address] [place], [place]  [address]   Dear Mr. Tabora,  Your test ordered by Selena Batten has been reviewed by your physician (or physician assistant) and was found to be normal or stable. Your physician (or physician assistant) felt no changes were needed at this time.  ____ Echocardiogram  _x___ Cardiac Stress Test  ____ Lab Work  ____ Peripheral vascular study of arms, legs or neck  ____ CT scan or X-ray  ____ Lung or Breathing test  ____ Other:  No change in medical treatment at this time, per Dr. Dietrich Pates.  Thank you.   Teressa Lower RN    [place] Bing, MD, F.A.C.Gaylord Shih, MD, F.A.C.C Lewayne Bunting, MD, F.A.C.C Nona Dell, MD, F.A.C.C Charlton Haws, MD, Lenise Arena.C.C

## 2010-11-07 NOTE — Letter (Signed)
Summary: [place] Results Engineer, agricultural at [facility]  [address] [place] [address]   Phone: [phone]  Fax: [phone]      July 25, 2010 MRN: [id number]   [facility] [address] [place], [place]  [address]   Dear Mr. Stille,  Your test ordered by Selena Batten has been reviewed by your physician (or physician assistant) and was found to be normal or stable. Your physician (or physician assistant) felt no changes were needed at this time.  ____ Echocardiogram  ____ Cardiac Stress Test  _x___ Lab Work  ____ Peripheral vascular study of arms, legs or neck  ____ CT scan or X-ray  ____ Lung or Breathing test  ____ Other: No change in medical treatment at this time, per Dr. Dietrich Pates.  This was your pre-cath labwork.  Thank you, Rogina Schiano Allyne Gee RN    [place] Bing, MD, Lenise Arena.C.Gaylord Shih, MD, F.A.C.C Lewayne Bunting, MD, F.A.C.C Nona Dell, MD, F.A.C.C Charlton Haws, MD, Lenise Arena.C.C

## 2010-11-07 NOTE — Letter (Signed)
Summary: Cardiac Catheterization Instructions- JV Lab  [facility] [place]  [address] [place] [address]   Phone: [phone]  Fax: [phone]     07/12/2010 MRN: [id number]  [facility] [address] [place], [place]  [address]  Dear Mr. Lanphere,   You are scheduled for a Cardiac Catheterization on 07/25/10 with Dr.Cooper  Please arrive to the 1st floor of the Heart and Vascular Center at [facility] at 7:30 am  on the day of your procedure. Please do not arrive before 6:30 a.m. Call the Heart and Vascular Center at [phone] if you are unable to make your appointmnet. The Code to get into the parking garage under the building is 0200. Take the elevators to the 1st floor. You must have someone to drive you home. Someone must be with you for the first 24 hours after you arrive home. Please wear clothes that are easy to get on and off and wear slip-on shoes. Do not eat or drink after midnight except water with your medications that morning. Bring all your medications and current insurance cards with you.  ___ DO NOT take these medications before your procedure: ________________________________________________________________  ___ Make sure you take your aspirin.  _x__ You may take ALL of your medications with water that morning. ________________________________________________________________________________________________________________________________  ___ DO NOT take ANY medications before your procedure.  ___ Pre-med instructions:  ________________________________________________________________________________________________________________________________  The usual length of stay after your procedure is 2 to 3 hours. This can vary.  If you have any questions, please call the office at the number listed above.   Teressa Lower RN

## 2010-11-07 NOTE — Progress Notes (Signed)
  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

## 2010-11-07 NOTE — Letter (Signed)
Summary: Appointment - Missed  [facility] [place]  [address] [place] [address]   Phone: [phone]  Fax: [phone]     August 08, 2010 MRN: [id number]   [facility] [address] [place], [place]  [address]   Dear Mr. Mclucas,  Our records indicate you missed your appointment on       08/08/10 Joni Reining NP                  It is very important that we reach you to reschedule this appointment. We look forward to participating in your health care needs. Please contact us at the number listed above at your earliest convenience to reschedule this appointment.     Sincerely,    Glass blower/designer

## 2010-11-07 NOTE — Progress Notes (Signed)
Summary: pt says he needs a cath  Phone Note Call from Patient Call back at [phone]   Caller: pt Reason for Call: Talk to Nurse Summary of Call: pt needs another cath done. would like to set it up for next week some time and doesnt care who it is with Initial call taken by: Faythe Ghee,  June 30, 2010 10:36 AM     Appended Document: pt says he needs a cath    Phone Note Outgoing Call   Call placed by: Teressa Lower RN,  July 07, 2010 9:19 AM Summary of Call: lmom : pt to call office for date next week that would be most convenient for him to have his cath.  Initial call taken by: Teressa Lower RN,  July 07, 2010 9:22 AM  Follow-up for Phone Call        pt decided he does not want a cath at this time Follow-up by: Teressa Lower RN,  July 07, 2010 9:18 AM

## 2010-11-07 NOTE — Progress Notes (Signed)
Summary: Re: FAA reqirements  Phone Note Call from Patient Call back at Home Phone [phone]   Caller: Patient Reason for Call: Talk to Nurse Summary of Call: pt would like to talk to nurse concerning a letter from the [facility].  Initial call taken by: Edman Circle,  May 16, 2010 3:52 PM  Follow-up for Phone Call        [phone] pt to come in am with letter and we will call FAA and discuss actual documents needed for job. Follow-up by: Teressa Lower RN,  May 16, 2010 5:20 PM  Additional Follow-up for Phone Call Additional follow up Details #1::        S:  FAA is requiring pt have a 6 month post event cath per aviation guidelines B: pt would like for you to discuss this issue with the aviation board... his cath was performed on 06/30/2009, he had a normal myoview on 02/14/10 A: pt has had no cp and c/o since intervention. R: I have the letter from the [facility] that discusses the issue in detail. Additional Follow-up by: Teressa Lower RN,  May 17, 2010 5:00 PM    Additional Follow-up for Phone Call Additional follow up Details #2::    We will need to comply with FAA requirements. Cath can be performed if he desires.   Follow-up by: Kathlen Brunswick, MD, [facility],  May 21, 2010 3:14 PM

## 2010-11-07 NOTE — Miscellaneous (Signed)
Summary: LABS LIPID,LIVER,09/15/2009  Clinical Lists Changes  Observations: Added new observation of ALBUMIN: 4.7 g/dL (16/07/9603 54:09) Added new observation of PROTEIN, TOT: 6.7 g/dL (81/19/1478 29:56) Added new observation of SGPT (ALT): 11 units/L (09/15/2009 15:48) Added new observation of SGOT (AST): 11 units/L (09/15/2009 15:48) Added new observation of ALK PHOS: 81 units/L (09/15/2009 15:48) Added new observation of BILI DIRECT: 0.1 mg/dL (21/30/8657 84:69) Added new observation of LDL: 40 mg/dL (62/95/2841 32:44) Added new observation of HDL: 41 mg/dL (10/10/7251 66:44) Added new observation of TRIGLYC TOT: 89 mg/dL (03/47/4259 56:38) Added new observation of CHOLESTEROL: 99 mg/dL (75/64/3329 51:88)

## 2010-11-07 NOTE — Progress Notes (Signed)
Summary: Pt would like to speak w/Kathryn Lyman Bishop  Phone Note Call from Patient Call back at [phone]   Caller: Patient Summary of Call: pt would like to speak w/ Samara Deist regarding Catherization the FAA would like for him to do/tg Initial call taken by: Raechel Ache [facility],  July 06, 2010 10:13 AM  Follow-up for Phone Call        Retruned call at 1:10 pm.  No answer, left a voice message for him to call back. Follow-up by: Joni Reining NP  Additional Follow-up for Phone Call Additional follow up Details #1::        Pt has decided not to have catherization at this time Additional Follow-up by: Teressa Lower RN,  July 07, 2010 9:58 AM

## 2010-11-07 NOTE — Assessment & Plan Note (Signed)
Summary: 12/2009 f/u per checkout on 08/16/09/tg   Visit Type:  Follow-up Primary Provider:  Forest Becker  CC:  follow-up visit.  History of Present Illness: [name]) Jeremy Lopez is a very engaging gentleman with coronary artery disease, now 8 months following placement of a drug-eluting stent in the LAD.  He is essentially asymptomatic from a cardiovascular standpoint and generally active without difficulty.  He does report occasional chest discomfort of mild to moderate severity with vague location and quality that is unrelated to exercise.  He has no orthopnea, PND no dyspnea.  He does occasionally note lightheadedness when he arises from a squatting position.  Prior records reviewed including an EKG from 07/01/2009, which was within normal limits.  EKG  Procedure date:  07/01/2009  Findings:      Normal sinus rhythm Borderline first degree AV block Otherwise within normal limits.   Preventive Screening-Counseling & Management  Alcohol-Tobacco     Smoking Status: quit     Year Quit: 1999  Current Medications (verified): 1)  Toprol Xl 100 Mg Xr24h-Tab (Metoprolol Succinate) .... Take 1/2 Tab Daily 2)  Plavix 75 Mg Tabs (Clopidogrel Bisulfate) .... Take 1 Tab Daily 3)  Diltiazem Hcl Er Beads 180 Mg Xr24h-Cap (Diltiazem Hcl Er Beads) .... Take One Capsule By Mouth Daily 4)  Crestor 40 Mg Tabs (Rosuvastatin Calcium) .... Take 1 Tab Daily 5)  Aspir-Low 81 Mg Tbec (Aspirin) .... Take 1 Tab Daily  Allergies (verified): 1)  ! Keflex  Comments:  Nurse/Medical Assistant: The patient's medications and allergies were reviewed with the patient and were updated in the Medication and Allergy Lists. Bottles reviewed.  Past History:  PMH, FH, and Social History reviewed and updated.  Review of Systems       See history of present illness.  Vital Signs:  Patient profile:   60 year old male Height:      70 inches Weight:      190 pounds Pulse rate:   63 / minute Pulse (ortho):    69 / minute BP sitting:   106 / 73  (left arm) BP standing:   102 / 68 Cuff size:   large  Vitals Entered By: Carlye Grippe (Feb 06, 2010 1:27 PM)  Serial Vital Signs/Assessments:  Time      Position  BP       Pulse  Resp  Temp     By 2:36 PM   Lying RA  114/79   67                    Tammy Sanders RN 2:36 PM   Sitting   116/75   67                    Tammy Sanders RN 2:36 PM   Standing  102/68   69                    Tammy Sanders RN  Comments: 2:36 PM no complaints of dizziness with ortho vs By: Teressa Lower RN   CC: follow-up visit   Physical Exam  General:   General-Well developed; no acute distress: Weight-190, decreased from 204 6 months ago   Neck-No JVD; no carotid bruits: Lungs-No tachypnea, no rales; no rhonchi; no wheezes: Cardiovascular-normal PMI; normal S1 and S2:minimal systolic murmur at the left sternal border Abdomen-BS normal; soft and non-tender without masses or organomegaly:  Musculoskeletal-No deformities, no cyanosis or clubbing: Neurologic-Normal cranial nerves; symmetric  strength and tone:  Skin-Warm, no significant lesions: Extremities-distal pulses intact; no edema:     Impression & Recommendations:  Problem # 1:  ATHEROSCLEROTIC CARDIOVASCULAR DISEASE (ICD-429.2) Patient is generally doing well, but does have some chest discomfort.  Symptoms preceding percutaneous intervention were fairly subtle.  We will proceed with a stress test to exclude recurrent myocardial ischemia.  Clopidogrel treatment should last at least one year.  We will plan to discontinue that drug at his next visit.  Problem # 2:  HYPERTENSION (ICD-401.9) Blood pressure control is excellent, partially due to medical therapy and partially to weight loss.  Since he has some dizziness and borderline orthostatic changes, diltiazem will be reduced to 180 mg q.d.  Patient will continue to follow blood pressures at home.  Problem # 3:  HYPERLIPIDEMIA (ICD-272.4) Lipid profile  was awesome; current medication will be continued, but can probably be substituted for a less expensive and somewhat less potent medication in the future.  I will see this nice to him and again in 6 months.  Other Orders: Nuclear Stress Test (Nuc Stress Test)  Patient Instructions: 1)  Your physician recommends that you schedule a follow-up appointment in:  6 MONTHS 2)  Your physician has requested that you have an exercise stress myoview.  For further information please visit [email or web address]  Please follow instruction sheet, as given. 3)  Your physician has requested that you regularly monitor and record your blood pressure readings at home.  Please use the same machine at the same time of day to check your readings and record them to bring to your follow-up visit. PLEASE RECORD 4)  Your physician has recommended you make the following change in your medication: DECREASE DILTIAZEM TO 180MG  DAILY WITH NEXT REFILL Prescriptions: DILTIAZEM HCL ER BEADS 180 MG XR24H-CAP (DILTIAZEM HCL ER BEADS) Take one capsule by mouth daily  #30 x 6   Entered by:   Teressa Lower RN   Authorized by:   Kathlen Brunswick, MD, [facility]   Signed by:   Teressa Lower RN on 02/06/2010   Method used:   Electronically to        [facility] [phone]* (retail)       [address]       [place], [place]  [address]       Ph: [phone] or [phone]       Fax: [phone]   RxID:   [id number]

## 2010-11-07 NOTE — Letter (Signed)
Summary: Madeira Treadmill (Nuc Med Stress)  [facility]  [address] [place] [address]   Phone: [phone]  Fax: [phone]    Nuclear Medicine 1-Day Stress Test Information Sheet  Re:     Jeremy Lopez   DOB:     1951-10-03 MRN:     [id number] Weight:  Appointment Date: Register at: Appointment Time: Referring MD:  _X__Exercise Stress  __Adenosine   __Dobutamine  __Lexiscan  __Persantine   __Thallium  Urgency: __X__1 (next day)   ____2 (one week)    ____3 (PRN)  Patient will receive Follow Up call with results: Patient needs follow-up appointment:  Instructions regarding medication:  How to prepare for your stress test: 1. DO NOT eat or dring 6 hours prior to your arrival time. This includes no caffeine (coffee, tea, sodas, chocolate) if you were instructed to take your medications, drink water with it. 2. DO NOT use any tobacco products for at leaset 8 hours prior to arrival. 3. DO NOT wear dresses or any clothing that may have metal clasps or buttons. 4. Wear short sleeve shirts, loose clothing, and comfortalbe walking shoes. 5. DO NOT use lotions, oils or powder on your chest before the test. 6. The test will take approximately 3-4 hours from the time you arrive until completion. 7. To register the day of the test, go to the Short Stay entrance at [facility]. 8. If you must cancel your test, call [phone] as soon as you are aware.  After you arrive for test:   When you arrive at [facility], you will go to Short Stay to be registered. They will then send you to Radiology to check in. The Nuclear Medicine Tech will get you and start an IV in your arm or hand. A small amount of a radioactive tracer will then be injected into your IV. This tracer will then have to circulate for 30-45 minutes. During this time you will wait in the waiting room and you will be able to drink something without caffeine. A series of pictures will be taken  of your heart follwoing this waiting period. After the 1st set of pictures you will go to the stress lab to get ready for your stress test. During the stress test, another small amount of a radioactive tracer will be injected through your IV. When the stress test is complete, there is a short rest period while your heart rate and blood pressure will be monitored. When this monitoring period is complete you will have another set of pictrues taken. (The same as the 1st set of pictures). These pictures are taken between 15 minutes and 1 hour after the stress test. The time depends on the type of stress test you had. Your doctor will inform you of your test results within 7 days after test.    The possibilities of certain changes are possible during the test. They include abnormal blood pressure and disorders of the heart. Side effects of persantine or adenosine can include flushing, chest pain, shortness of breath, stomach tightness, headache and light-headedness. These side effects usually do not last long and are self-resolving. Every effort will be made to keep you comfortable and to minimize complications by obtaining a medical history and by close observation during the test. Emergency equipment, medications, and trained personnel are available to deal with any unusual situation which may arise.  Please notify office at least 48 hours in advance if you are unable to keep  this appt. PLEASE DO NOT TAKE THE FOLLOWING MEDICATIONS THE MORNING OF YOUR  STRESS TEST: TOPROL

## 2010-11-07 NOTE — Letter (Signed)
Summary: Clearance Letter  [facility] at [facility]  [address] [place] [address]   Phone: [phone]  Fax: [phone]    March 14, 2010  Re:     [facility] Address:   [address]     [name], [place]  [address] DOB:     07/09/1951 MRN:     [id number]   Dear Aviation Medical Examiner,  Enclosed are the records you were requesting for [name].  It is the last office note, a copy of his stress test, and the most recent EKG.      Sincerely,  Raechel Ache [facility]

## 2010-11-09 NOTE — Letter (Signed)
Summary: Generic Letter  [facility] at [facility]  [address] [place] [address]   Phone: [phone]  Fax: [phone]        September 19, 2010 MRN: [id number]    [facility] [address] [place], [place]  [address]    To Whom It May Concern:       Mr. Widen has been treated at [facility] since August of 2008.  He was prescribed alprazolam 0.25mg  as needed for anxiety after a hospitalization for a cardiac catherization in October of 2010.  This prescriptions was never renewed and he has no been on klonipin to our knowledge at all since first coming to our office in 2008.  I hope this letter help to clear up any lingering questions you may have about Mr. Padget current medicaiton list.            Sincerely,    Joni Reining, NP  This letter has been electronically signed by your physician.

## 2010-11-09 NOTE — Progress Notes (Signed)
Summary: rx refill pt has been out for a few days  Phone Note From Pharmacy Call back at [phone]   [facility] [phone]* Summary of Call: plavix 75mg  #90 needs called in he has been out for seven days and they have faxed several request. I did verify fax number and asked to fax again but nothing came through. Initial call taken by: Faythe Ghee,  October 16, 2010 10:01 AM    Prescriptions: PLAVIX 75 MG TABS (CLOPIDOGREL BISULFATE) take 1 tab daily  #30 x 0   Entered by:   Teressa Lower RN   Authorized by:   Joni Reining, NP   Signed by:   Teressa Lower RN on 10/16/2010   Method used:   [facility]ronically to        [facility] [phone]* (retail)       [address]       [place], [place]  [address]       Ph: [phone]       Fax: [phone]   RxID:   [id number]

## 2010-11-09 NOTE — Progress Notes (Signed)
Summary: PT NEED LETTER  Phone Note Call from Patient Call back at Home Phone [phone]   Caller: PT Reason for Call: Talk to Nurse Summary of Call: PT NEEDS A LETTER TYPED UP FROM OUR OFFICE AND SIGNED BY THE DOCTOR STATING THAT HE HAS NEVER BEEN PERSCRIBED KLONIPIN OR XANAX FROM OUR OFFICE. HE NEEDS THIS FOR FA  MEDICAL. Initial call taken by: Faythe Ghee,  September 12, 2010 11:22 AM  Follow-up for Phone Call        Pt. advised that I do not feel comfortable writing this letter. Pt. asked if I would forward request to Teressa Lower, RN as she is more formilar with his situation. Follow-up by: Larita Fife Via LPN,  September 12, 2010 2:15 PM  Additional Follow-up for Phone Call Additional follow up Details #1::        Samara Deist  This pt was discharged from [facility] in 2010 with xanax and did not have it renewed and he has never been on klonipin since coming to this office.  If you feel comfortable writing this letter that is fine if not that if fine also just let me know Additional Follow-up by: Teressa Lower RN,  September 14, 2010 10:10 AM    Go ahead and write the letter.  I will sign it.    Joni Reining NP

## 2010-11-27 ENCOUNTER — Other Ambulatory Visit (HOSPITAL_COMMUNITY): Payer: BC Managed Care – PPO

## 2010-11-27 ENCOUNTER — Encounter (HOSPITAL_COMMUNITY): Payer: BC Managed Care – PPO | Attending: Oncology

## 2010-11-27 DIAGNOSIS — C3490 Malignant neoplasm of unspecified part of unspecified bronchus or lung: Secondary | ICD-10-CM

## 2010-12-18 LAB — BLOOD GAS, ARTERIAL
Acid-base deficit: 1 mmol/L (ref 0.0–2.0)
pCO2 arterial: 36.8 mmHg (ref 35.0–45.0)
pH, Arterial: 7.411 (ref 7.350–7.450)
pO2, Arterial: 96.7 mmHg (ref 80.0–100.0)

## 2010-12-26 ENCOUNTER — Other Ambulatory Visit: Payer: Self-pay | Admitting: Adult Health

## 2010-12-27 LAB — COMPREHENSIVE METABOLIC PANEL
ALT: 16 U/L (ref 0–53)
AST: 14 U/L (ref 0–37)
Alkaline Phosphatase: 63 U/L (ref 39–117)
CO2: 28 mEq/L (ref 19–32)
Calcium: 8.8 mg/dL (ref 8.4–10.5)
Chloride: 102 mEq/L (ref 96–112)
GFR calc Af Amer: >60 mL/min (ref >60)
GFR calc non Af Amer: >60 mL/min (ref >60)
Potassium: 4.4 mEq/L (ref 3.5–5.1)
Sodium: 137 mEq/L (ref 135–145)

## 2010-12-27 LAB — DIFFERENTIAL
Basophils Relative: 0 % (ref 0–1)
Eosinophils Absolute: 0.1 10*3/uL (ref 0.0–0.7)
Eosinophils Relative: 1 % (ref 0–5)
Lymphs Abs: 0.9 10*3/uL (ref 0.7–4.0)

## 2010-12-27 LAB — CBC
Hemoglobin: 13 g/dL (ref 13.0–17.0)
MCHC: 34.3 g/dL (ref 30.0–36.0)
RBC: 3.99 MIL/uL — ABNORMAL LOW (ref 4.22–5.81)
WBC: 6.4 10*3/uL (ref 4.0–10.5)

## 2011-01-02 ENCOUNTER — Other Ambulatory Visit: Payer: Self-pay | Admitting: *Deleted

## 2011-01-02 DIAGNOSIS — I2510 Atherosclerotic heart disease of native coronary artery without angina pectoris: Secondary | ICD-10-CM

## 2011-01-02 MED ORDER — CLOPIDOGREL BISULFATE 75 MG PO TABS: 75.0000 mg | ORAL_TABLET | Freq: Every day | ORAL | Status: DC

## 2011-01-04 NOTE — Telephone Encounter (Signed)
[place] °

## 2011-01-08 ENCOUNTER — Other Ambulatory Visit: Payer: Self-pay | Admitting: Adult Health

## 2011-01-08 ENCOUNTER — Encounter (HOSPITAL_COMMUNITY): Payer: BC Managed Care – PPO | Attending: Internal Medicine

## 2011-01-08 DIAGNOSIS — I2510 Atherosclerotic heart disease of native coronary artery without angina pectoris: Secondary | ICD-10-CM

## 2011-01-12 LAB — CBC
HCT: 38.5 % — ABNORMAL LOW (ref 39.0–52.0)
Hemoglobin: 13.2 g/dL (ref 13.0–17.0)
MCHC: 34.4 g/dL (ref 30.0–36.0)
MCHC: 34.4 g/dL (ref 30.0–36.0)
RBC: 4.2 MIL/uL — ABNORMAL LOW (ref 4.22–5.81)
RDW: 13.6 % (ref 11.5–15.5)
RDW: 14 % (ref 11.5–15.5)

## 2011-01-12 LAB — DIFFERENTIAL
Basophils Absolute: 0.1 10*3/uL (ref 0.0–0.1)
Basophils Relative: 2 % — ABNORMAL HIGH (ref 0–1)
Neutro Abs: 4.2 10*3/uL (ref 1.7–7.7)
Neutrophils Relative %: 72 % (ref 43–77)

## 2011-01-12 LAB — BASIC METABOLIC PANEL
CO2: 25 mEq/L (ref 19–32)
CO2: 27 mEq/L (ref 19–32)
Calcium: 9.1 mg/dL (ref 8.4–10.5)
Creatinine, Ser: 1.13 mg/dL (ref 0.4–1.5)
GFR calc Af Amer: >60 mL/min (ref >60)
GFR calc non Af Amer: >60 mL/min (ref >60)
Glucose, Bld: 105 mg/dL — ABNORMAL HIGH (ref 70–99)
Glucose, Bld: 91 mg/dL (ref 70–99)
Potassium: 3.5 mEq/L (ref 3.5–5.1)
Sodium: 137 mEq/L (ref 135–145)

## 2011-01-12 LAB — LIPID PANEL
Triglycerides: 122 mg/dL (ref <150)
VLDL: 24 mg/dL (ref 0–40)

## 2011-01-12 LAB — PROTIME-INR: INR: 0.9 (ref 0.00–1.49)

## 2011-01-12 LAB — APTT: aPTT: 29 seconds (ref 24–37)

## 2011-01-23 LAB — CBC
HCT: 38.9 % — ABNORMAL LOW (ref 39.0–52.0)
Platelets: 286 10*3/uL (ref 150–400)
WBC: 6.3 10*3/uL (ref 4.0–10.5)

## 2011-01-23 LAB — COMPREHENSIVE METABOLIC PANEL
ALT: 18 U/L (ref 0–53)
AST: 16 U/L (ref 0–37)
Albumin: 4.1 g/dL (ref 3.5–5.2)
Alkaline Phosphatase: 81 U/L (ref 39–117)
BUN: 17 mg/dL (ref 6–23)
Chloride: 104 mEq/L (ref 96–112)
Potassium: 3.8 mEq/L (ref 3.5–5.1)
Sodium: 138 mEq/L (ref 135–145)
Total Bilirubin: 0.5 mg/dL (ref 0.3–1.2)

## 2011-01-23 LAB — DIFFERENTIAL
Basophils Absolute: 0 10*3/uL (ref 0.0–0.1)
Basophils Relative: 0 % (ref 0–1)
Eosinophils Absolute: 0.1 10*3/uL (ref 0.0–0.7)
Eosinophils Relative: 2 % (ref 0–5)
Monocytes Absolute: 0.7 10*3/uL (ref 0.1–1.0)
Neutro Abs: 4.3 10*3/uL (ref 1.7–7.7)

## 2011-02-07 ENCOUNTER — Telehealth: Payer: Self-pay | Admitting: Cardiology

## 2011-02-07 DIAGNOSIS — I2510 Atherosclerotic heart disease of native coronary artery without angina pectoris: Secondary | ICD-10-CM

## 2011-02-07 MED ORDER — CLOPIDOGREL BISULFATE 75 MG PO TABS: 75.0000 mg | ORAL_TABLET | Freq: Every day | ORAL | Status: DC

## 2011-02-07 MED ORDER — ROSUVASTATIN CALCIUM 40 MG PO TABS: 40.0000 mg | ORAL_TABLET | Freq: Every day | ORAL | Status: DC

## 2011-02-07 MED ORDER — METOPROLOL SUCCINATE ER 50 MG PO TB24: 50.0000 mg | ORAL_TABLET | Freq: Every day | ORAL | Status: DC

## 2011-02-07 NOTE — Telephone Encounter (Signed)
PT NEEDS CRESTOR 40MG , PLAVIX 75MG  AND METOPROLOL 50MG  [facility] PER PT BEEN TRYING TO GET SINCE Friday AND HAS RUN OUT

## 2011-02-08 ENCOUNTER — Telehealth: Payer: Self-pay | Admitting: Cardiology

## 2011-02-08 NOTE — Telephone Encounter (Signed)
Needs 90 day supply of Crestor 40mg  and Plavix 75mg  [facility] [place] / tg

## 2011-02-09 NOTE — Telephone Encounter (Signed)
Will give pt a 90 day supply after his appt

## 2011-02-19 ENCOUNTER — Encounter: Payer: Self-pay | Admitting: Adult Health

## 2011-02-19 ENCOUNTER — Ambulatory Visit (INDEPENDENT_AMBULATORY_CARE_PROVIDER_SITE_OTHER): Payer: BC Managed Care – PPO | Admitting: Adult Health

## 2011-02-19 DIAGNOSIS — I10 Essential (primary) hypertension: Secondary | ICD-10-CM

## 2011-02-19 DIAGNOSIS — I2510 Atherosclerotic heart disease of native coronary artery without angina pectoris: Secondary | ICD-10-CM

## 2011-02-19 DIAGNOSIS — E785 Hyperlipidemia, unspecified: Secondary | ICD-10-CM

## 2011-02-19 DIAGNOSIS — E782 Mixed hyperlipidemia: Secondary | ICD-10-CM

## 2011-02-19 NOTE — Assessment & Plan Note (Signed)
Will follow up with lipids and LFT's to continue ongoing evaluation.

## 2011-02-19 NOTE — Progress Notes (Signed)
HPI: [name] is a 60 y/o CM with known history of CAD, with DES to LAD in 2010, follow up cath for [facility]  2011 demonstrating widely patent LAD stent with mild diffuse in-stent stenosis.   Minor luminal irregularities.  Normal LV fx.  Also history of hypercholesterolemia.  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  He is without complaint, is able to walk 2 miles everyday without discomfort.  He is medically compliant.  Allergies  Allergen Reactions  . Cephalexin     REACTION: throat swelling    Current Outpatient Prescriptions  Medication Sig Dispense Refill  . aspirin 81 MG tablet Take 81 mg by mouth daily.        Marland Kitchen diltiazem (CARDIZEM CD) 180 MG 24 hr capsule Take 180 mg by mouth daily.        . metoprolol (TOPROL-XL) 50 MG 24 hr tablet Take 1 tablet (50 mg total) by mouth daily.  30 tablet  0  . rosuvastatin (CRESTOR) 40 MG tablet Take 1 tablet (40 mg total) by mouth daily.  30 tablet  0  . DISCONTD: clopidogrel (PLAVIX) 75 MG tablet Take 1 tablet (75 mg total) by mouth daily.  30 tablet  0    Past Medical History  Diagnosis Date  . ASCVD (arteriosclerotic cardiovascular disease)      DES placed for 95% proximal LAD stenosis in 07/2009; scattered 20-40% lesions; normal EF  . Carcinoma     RIGHT UPPER LOBE; SMALL CELL-2008; no evidence of disease since treatment ended in 11/2007  . Hypertension   . Hyperlipidemia     Past Surgical History  Procedure Date  . Cardiac catheterization  06/30/09     (endeaver 2.38mm x 18mm drug eluting stent)  . Right upper lobe small cell carcinoma     WUJ:WJXBJY of systems complete and found to be negative unless listed above PHYSICAL EXAM BP 118/72  Pulse 72  Ht 5\' 10"  (1.778 m)  Wt 203 lb (92.08 kg)  BMI 29.13 kg/m2  SpO2 96% General: Well developed, well nourished, in no acute distress Head: Eyes PERRLA, No xanthomas.   Normal cephalic and atramatic  Lungs: Clear bilaterally to auscultation  and percussion. Heart: HRRR S1 S2,  Pulses are 2+ & equal.            No carotid bruit. No JVD.  No abdominal bruits. No femoral bruits. Abdomen: Bowel sounds are positive, abdomen soft and non-tender without masses or                  Hernia's noted. Msk:  Back normal, normal gait. Normal strength and tone for age. Extremities: No clubbing, cyanosis or edema.  DP +1 Neuro: Alert and oriented X 3. Psych:  Good affect, responds appropriately   ASSESSMENT AND PLAN

## 2011-02-19 NOTE — Assessment & Plan Note (Signed)
He is stable from cardiac standpoint. He denies symptoms.  Review of cardiac cath in Oct 2011 demonstrated widely patent stent with mild diffuse instent restenosis.  He walks 2 miles a day without complaint. I will stop his Plavix as he has been taking it for over 2 years.  He will continue cholesterol lowering diet and exercise.  We will see him in 6 months.

## 2011-02-19 NOTE — Assessment & Plan Note (Signed)
Very well controlled at this time. He will continue current medications.

## 2011-02-19 NOTE — Patient Instructions (Signed)
**Note De-Identified Venancio Chenier Obfuscation** Your physician has recommended you make the following change in your medication: Stop taking Plavix  Your physician recommends that you return for lab work in: 6 months  Your physician recommends that you schedule a follow-up appointment in: 6 months

## 2011-02-20 NOTE — Letter (Signed)
May 28, 2007   Kingsley Callander. Ouida Sills, MD  [address]  [place], [place] [address]   Re:  KARL, ERWAY                DOB:  01/25/1951   Dear Channing Mutters:   I saw this patient back today.  His blood pressure is 138/86, pulse 78,  respirations 18.  Sats were 94%.  His mediastinoscopy site was healing  well.  Lungs were clear to auscultation.  His pathology was small cell  cancer.  I referred him to Ladona Horns. Neijstrom, M.D. for treatment.  We  had a long discussion with him and his family about the treatment for  small cell cancer and the prognosis.  I will see him again in 3 weeks.  I appreciate the opportunity of seeing this patient.  If I can be of any  further help, please let me know.   Ines Bloomer, M.D.  Electronically Signed   DPB/MEDQ  D:  05/28/2007  T:  05/29/2007  Job:  [id number]   cc:   Ladona Horns. Mariel Sleet, MD

## 2011-02-20 NOTE — Op Note (Signed)
NAME:  Jeremy Lopez, Jeremy Lopez                ACCOUNT NO.:  [id number]   MEDICAL RECORD NO.:  [id number]          PATIENT TYPE:  AMB   LOCATION:  SDS                          FACILITY:  [facility]   PHYSICIAN:  Ines Bloomer, M.D. DATE OF BIRTH:  [birth year]   DATE OF PROCEDURE:  06/18/2007  DATE OF DISCHARGE:                               OPERATIVE REPORT   PREOPERATIVE DIAGNOSIS:  Small cell lung cancer.   POSTOPERATIVE DIAGNOSIS:  Small cell lung cancer.   OPERATION:  Insertion of left subclavian Port-A-Cath.   SURGEON:  Ines Bloomer, M.D.   Local anesthesia with 1% Xylocaine and IV sedation.   After prep and drape in the usual sterile manner, the patient underwent  IV sedation and was infiltrated with 1% Xylocaine at the left subclavian  area and a left subclavian puncture was performed.  Guidewire was  threaded under fluoro guidance to the right atrium.  A stab wound was  made around the guidewire.  Another area was infiltrated with 1%  Xylocaine inferior to this.  A transverse incision was made, dissection  was carried down to subcutaneous tissue to the pectoral fascia, and the  .  The [employer] Port-A-Cath was inserted and sutured in place  with 3-0 silk.  The tubing was then tunneled through to stab wound  around the guidewire.  It was marked appropriately with fluoro.  Over  the guidewire was passed a dilator with peel-away sheath.  The dilator  and guidewire were removed.  Through the peel-away sheath was passed the  tubing.  The peel-away sheath was removed.  This was confirmed to be at  the right atrial-SVC junction.  It flushed easily and withdrew easily  with a Huber needle.  The wounds were closed with 3-0 Vicryl in the  subcutaneous tissue and Dermabond for the skin.  The patient tolerated  the procedure well and was returned to the recovery room in stable  condition.      Ines Bloomer, M.D.  Electronically Signed     DPB/MEDQ  D:  06/19/2007  T:   06/19/2007  Job:  [id number]

## 2011-02-20 NOTE — Letter (Signed)
May 22, 2007   Kingsley Callander. Ouida Sills, MD  [address]  [place]  [place] [address]   Re:  Jeremy Lopez, Jeremy Lopez                DOB:  01/12/1951   Dear Channing Mutters:   I saw Jeremy Lopez in the offices today.  This 60 year old patient who  has quit smoking 7 years ago, had a history of some shortness of breath  and some right-sided chest pain, and a chest x-ray was obtained, which  revealed a right upper lobe lesion.  A CT scan was then done and showed  a small right upper lobe pulmonary nodule with extensive mediastinal  adenopathy with some extensive effect on the superior vena cava.  He has  had no hemoptysis, fever or chills, but some shortness of breath.  His  pulmonary function test showed an FEC at 3.65 with an FEV 1 of 2.66.   PAST MEDICAL HISTORY:  He has hypertension.   MEDICATIONS:  He is on Tiazac, Benicar, and Toprol.   ALLERGIES:  KEFLEX CAUSES HIS TONGUE TO SWELL.   FAMILY HISTORY:  Positive for coronary artery disease, as well as  positive for cancer.   SOCIAL HISTORY:  He is married, has 2 children.  He works as an Manufacturing engineer, and quit smoking 7 years ago.  Does not drink alcohol on a  regular basis.   REVIEW OF SYSTEMS:  His weight has been stable.  CARDIAC:  No angina or atrial fibrillation.  PULMONARY:  No hemoptysis.  He has had some wheezing.  GI:  No nausea, vomiting, constipation, or diarrhea.  GU:  No dysuria or kidney disease.  VASCULAR:  No claudication, DVT, TIAs.  NEUROLOGICAL:  No dizziness, blackouts, headaches, or seizures.  MUSCULOSKELETAL:  No arthritis or joint pain.  No psychiatric illness.  No change in his eyesight or hearing.  No  problems with bleeding or clotting disorders.   PHYSICAL EXAMINATION:  He is a well-developed Caucasian male in no acute  distress.  His blood pressure 140/80, pulse 100, respirations 20,  saturations were 97%.  His eyes, ears, nose, and throat were  unremarkable.  Neck is supple without thyromegaly.  There is no  supraclavicular or axillary adenopathy.  Chest is clear to auscultation  and percussion.  Heart:  Regular sinus rhythm.  No murmurs.  Abdomen:  Soft.  There is no hepatosplenomegaly.  Pulses 2+.  There is no clubbing  or edema.  Neurological:  He is oriented x3.  Sensory and motor intact.  Cranial nerves are intact.   IMPRESSION:  I feel this is either small cell or non small cell or  lymphoma.  I would favor probably small cell with his history of tobacco  abuse in the past.   PLAN:  Get a PET scan on him.  He has had a recent brain scan, which was  normal, and then plan on next Tuesday to do a bronchoscopy and  mediastinoscopy.   I appreciate the opportunity of seeing Jeremy Lopez.  If we get a  diagnosis, we will refer him to Dr. Glenford Peers as soon as possible.   Sincerely,   Ines Bloomer, M.D.  Electronically Signed   DPB/MEDQ  D:  05/22/2007  T:  05/23/2007  Job:  [id number]   cc:   Ladona Horns. Mariel Sleet, MD

## 2011-02-20 NOTE — Op Note (Signed)
NAME:  Jeremy Lopez, Jeremy Lopez NO.:  [id number]   MEDICAL RECORD NO.:  [id number]          PATIENT TYPE:  AMB   LOCATION:  SDS                          FACILITY:  [facility]   PHYSICIAN:  Ines Bloomer, M.D. DATE OF BIRTH:  05/26/1951   DATE OF PROCEDURE:  05/27/2007  DATE OF DISCHARGE:  05/27/2007                               OPERATIVE REPORT   PREOPERATIVE DIAGNOSIS:  Right upper lobe mass with hilar and  mediastinal adenopathy.   POSTOPERATIVE DIAGNOSIS:  Probable small cell lung cancer.   OPERATION PERFORMED:  Video bronchoscopy and video mediastinoscopy.   SURGEON:  Ines Bloomer, M.D.   ANESTHESIA:  General anesthesia.   DESCRIPTION OF PROCEDURE:  After percutaneous insertion of all  monitoring lines, the patient underwent general anesthesia, was prepped  and draped in the usual sterile manner.  A video bronchoscope was first  passed through the endotracheal tube.  The carina was at midline.  The  left main stem, left upper lobe and left lower lobe bronchus were  normal.  The right main stem, right upper lobe, right middle lobe and  right lower lobe bronchus were normal.  In the anterior segment of the  right upper lobe under fluoroscopic guidance, we did brushings as well  as some transbronchial biopsies of the right upper lobe lesion.  The  video bronchoscope was removed.  The anterior neck was prepped and  draped in the usual sterile manner.  A transverse incision was made  above the sternal notch and dissection carried down to the subcutaneous  tissues and biopsies of a 2R node were done.  Frozen section revealed  probable small cell lung cancer.  The subcutaneous tissue was closed  with 3-0 Vicryl and Dermabond for the skin.  The patient was returned to  the recovery room in stable condition.      Ines Bloomer, M.D.  Electronically Signed     DPB/MEDQ  D:  05/27/2007  T:  05/27/2007  Job:  [id number]

## 2011-02-23 NOTE — Procedures (Signed)
   NAME:  Jeremy Lopez, Jeremy Lopez NO.:  [id number]   MEDICAL RECORD NO.:  [id number]                   PATIENT TYPE:  OUT   LOCATION:  RAD                                  FACILITY:  [facility]   PHYSICIAN:  Kingsley Callander. Ouida Sills, M.D.                  DATE OF BIRTH:  Apr 24, 1951   DATE OF PROCEDURE:  DATE OF DISCHARGE:                                    STRESS TEST   PROCEDURE:  Cardiolite stress test.   FINDINGS:  The patient exercised 10 minutes and 29 seconds (1 minute and 29  seconds into stage IV of the Bruce protocol) obtaining a maximal heart rate  of 169 (100% of the age predicted maximal heart rate ) at a work load of  12.9 METs and discontinued exercise due to fatigue.  There were no symptoms  of chest pain.  There were no arrhythmias.  There were no ST segment changes  diagnostic of ischemia.  The baseline electrocardiogram revealed normal  sinus rhythm at 83 beats per minute.   IMPRESSION:  1. No evidence of exercise induced ischemia.  2. No evidence of arrhythmias.  3. Cardiolite images pending.                                               Kingsley Callander. Ouida Sills, M.D.    ROF/MEDQ  D:  12/01/2002  T:  12/01/2002  Job:  [id number]

## 2011-02-23 NOTE — Procedures (Signed)
   NAME:  Jeremy Lopez, Jeremy Lopez NO.:  [id number]   MEDICAL RECORD NO.:  [id number]                   PATIENT TYPE:  OUT   LOCATION:  RAD                                  FACILITY:  [facility]   PHYSICIAN:  Vida Roller, M.D.                DATE OF BIRTH:  [birth year]   DATE OF PROCEDURE:  DATE OF DISCHARGE:                                  ECHOCARDIOGRAM   REASON FOR THE HOLTER EXAM:  Palpitations and chest discomfort.   DETAILS OF THE PROCEDURE:  The test was performed over the course of 12/03/02  and 12/04/02 over a 24-hour period.  The patient was evaluated with a  continuous EKG evaluation, as well as a detailed diary of events and  symptoms.   RESULTS:  The dominant rhythm is sinus with rates of 61-161.  There are rare  premature ventricular complexes.  There are no premature atrial complexes.  There is no supraventricular tachycardia or ventricular tachycardia or  atrial fibrillation or ventricular fibrillation or systole.  There are no  significant ST-T wave changes.  This is essentially an unremarkable Holter.  The PVCs correlate to the episodes of palpitations, and therefore are likely  benign PVCs associated with symptomatic palpitations.                                               Vida Roller, M.D.    JH/MEDQ  D:  12/04/2002  T:  12/04/2002  Job:  [id number]

## 2011-03-10 ENCOUNTER — Other Ambulatory Visit: Payer: Self-pay | Admitting: Cardiology

## 2011-03-27 ENCOUNTER — Encounter (HOSPITAL_COMMUNITY): Payer: Self-pay | Admitting: *Deleted

## 2011-03-29 ENCOUNTER — Other Ambulatory Visit (HOSPITAL_COMMUNITY): Payer: Self-pay | Admitting: Oncology

## 2011-03-29 DIAGNOSIS — C3410 Malignant neoplasm of upper lobe, unspecified bronchus or lung: Secondary | ICD-10-CM

## 2011-04-13 ENCOUNTER — Other Ambulatory Visit (HOSPITAL_COMMUNITY): Payer: Self-pay | Admitting: Oncology

## 2011-04-13 ENCOUNTER — Encounter (HOSPITAL_COMMUNITY): Payer: BC Managed Care – PPO | Attending: Internal Medicine

## 2011-04-13 DIAGNOSIS — C3490 Malignant neoplasm of unspecified part of unspecified bronchus or lung: Secondary | ICD-10-CM

## 2011-04-13 DIAGNOSIS — C3410 Malignant neoplasm of upper lobe, unspecified bronchus or lung: Secondary | ICD-10-CM | POA: Insufficient documentation

## 2011-04-13 LAB — COMPREHENSIVE METABOLIC PANEL
AST: 15 U/L (ref 0–37)
Albumin: 4.1 g/dL (ref 3.5–5.2)
Calcium: 9.2 mg/dL (ref 8.4–10.5)
Creatinine, Ser: 1.12 mg/dL (ref 0.50–1.35)
Total Protein: 6.9 g/dL (ref 6.0–8.3)

## 2011-04-16 ENCOUNTER — Encounter (HOSPITAL_BASED_OUTPATIENT_CLINIC_OR_DEPARTMENT_OTHER): Payer: BC Managed Care – PPO | Admitting: Oncology

## 2011-04-16 ENCOUNTER — Encounter (HOSPITAL_COMMUNITY): Payer: Self-pay | Admitting: Oncology

## 2011-04-16 VITALS — BP 111/67 | HR 63 | Temp 98.0°F | Wt 198.7 lb

## 2011-04-16 DIAGNOSIS — C3410 Malignant neoplasm of upper lobe, unspecified bronchus or lung: Secondary | ICD-10-CM

## 2011-04-16 NOTE — Progress Notes (Signed)
This office note has been dictated.

## 2011-04-16 NOTE — Progress Notes (Deleted)
[facility] OFFICE PROGRESS NOTE   Name: Jeremy Lopez MRN: [id number]  Date: 04/16/2011  DOB: 27-Nov-1950   CC: [name], MD  Carmie End), MD   DIAGNOSIS: There were no encounter diagnoses.   LABORATORY DATA:       REVIEW OF SYSTEMS: {dnt review of systems (ROS):20055}    PHYSICAL EXAM:  weight is 198 lb 11.2 oz (90.13 kg). His axillary temperature is 98 F (36.7 C). His blood pressure is 111/67 and his pulse is 63.    {physical exam:21449}   IMPRESSION: ***   PLAN: ***

## 2011-04-16 NOTE — Progress Notes (Signed)
Addended byMariel Sleet MD, Hawkins Seaman S on: 04/16/2011 06:26 PM   Modules accepted: Orders

## 2011-04-16 NOTE — Patient Instructions (Signed)
Needs colonoscopy with [facility]

## 2011-05-25 ENCOUNTER — Telehealth: Payer: Self-pay | Admitting: Cardiology

## 2011-05-25 MED ORDER — ROSUVASTATIN CALCIUM 40 MG PO TABS: 40.0000 mg | ORAL_TABLET | Freq: Every day | ORAL | Status: DC

## 2011-05-25 NOTE — Telephone Encounter (Signed)
Needs refill on Crestor 40mg  [facility] [place] / tg

## 2011-06-28 LAB — DIFFERENTIAL
Band Neutrophils: 0
Basophils Absolute: 0
Basophils Absolute: 0
Basophils Absolute: 0
Basophils Absolute: 0
Basophils Relative: 0
Basophils Relative: 0
Basophils Relative: 0
Basophils Relative: 0
Basophils Relative: 0
Eosinophils Absolute: 0
Eosinophils Absolute: 0.1
Eosinophils Relative: 0
Eosinophils Relative: 2
Lymphocytes Relative: 59 — ABNORMAL HIGH
Lymphocytes Relative: 8 — ABNORMAL LOW
Lymphocytes Relative: 59 — ABNORMAL HIGH
Lymphocytes Relative: 7 — ABNORMAL LOW
Lymphocytes Relative: 8 — ABNORMAL LOW
Lymphocytes Relative: 88 — ABNORMAL HIGH
Lymphs Abs: 0.4 — ABNORMAL LOW
Lymphs Abs: 0.2 — ABNORMAL LOW
Lymphs Abs: 0.2 — ABNORMAL LOW
Metamyelocytes Relative: 4
Monocytes Absolute: 0 — ABNORMAL LOW
Monocytes Absolute: 0 — ABNORMAL LOW
Monocytes Absolute: 0 — ABNORMAL LOW
Monocytes Absolute: 0.3
Monocytes Relative: 0 — ABNORMAL LOW
Monocytes Relative: 12
Monocytes Relative: 0 — ABNORMAL LOW
Monocytes Relative: 4
Monocytes Relative: 4
Monocytes Relative: 5
Neutro Abs: 0.1 — ABNORMAL LOW
Neutro Abs: 0.1 — ABNORMAL LOW
Neutro Abs: 2.7
Neutro Abs: 4.7
Neutrophils Relative %: 34 — ABNORMAL LOW
Neutrophils Relative %: 97 — ABNORMAL HIGH
Neutrophils Relative %: 7 — ABNORMAL LOW
Neutrophils Relative %: 87 — ABNORMAL HIGH
Promyelocytes Absolute: 56
Smear Review: DECREASED
Smear Review: DECREASED

## 2011-06-28 LAB — CBC
HCT: 37.3 — ABNORMAL LOW
HCT: 27 — ABNORMAL LOW
HCT: 28.1 — ABNORMAL LOW
HCT: 31.2 — ABNORMAL LOW
Hemoglobin: 12.1 — ABNORMAL LOW
Hemoglobin: 10.6 — ABNORMAL LOW
Hemoglobin: 8.2 — ABNORMAL LOW
Hemoglobin: 9.1 — ABNORMAL LOW
Hemoglobin: 9.5 — ABNORMAL LOW
MCHC: 33.6
MCHC: 34.3
MCHC: 34.5
MCHC: 33.7
MCHC: 34
MCHC: 34.6
MCV: 95.3
MCV: 94.6
Platelets: 250
Platelets: 32 — CL
Platelets: 92 — ABNORMAL LOW
RBC: 3.46 — ABNORMAL LOW
RBC: 2.52 — ABNORMAL LOW
RBC: 2.74 — ABNORMAL LOW
RBC: 2.77 — ABNORMAL LOW
RBC: 2.98 — ABNORMAL LOW
RDW: 18.1 — ABNORMAL HIGH
RDW: 19.7 — ABNORMAL HIGH
RDW: 16.3 — ABNORMAL HIGH
RDW: 16.3 — ABNORMAL HIGH
RDW: 16.6 — ABNORMAL HIGH
RDW: 17.5 — ABNORMAL HIGH
WBC: <0.4 — CL

## 2011-06-28 LAB — BASIC METABOLIC PANEL
BUN: 19
BUN: 21
CO2: 27
CO2: 31
CO2: 21
CO2: 23
Calcium: 8.6
Calcium: 8.7
Calcium: 8.4
Calcium: 8.5
Chloride: 96
Chloride: 103
Creatinine, Ser: 0.83
Creatinine, Ser: 0.85
GFR calc Af Amer: >60
GFR calc Af Amer: >60
GFR calc Af Amer: >60
GFR calc non Af Amer: >60
GFR calc non Af Amer: >60
GFR calc non Af Amer: >60
GFR calc non Af Amer: >60
Glucose, Bld: 100 — ABNORMAL HIGH
Glucose, Bld: 102 — ABNORMAL HIGH
Glucose, Bld: 104 — ABNORMAL HIGH
Potassium: 3.6
Potassium: 3.9
Potassium: 4.2
Sodium: 135
Sodium: 135
Sodium: 135
Sodium: 136

## 2011-06-28 LAB — PREPARE PLATELET PHERESIS

## 2011-06-28 LAB — COMPREHENSIVE METABOLIC PANEL
ALT: 14
AST: 13
Albumin: 3.4 — ABNORMAL LOW
Calcium: 8.8
GFR calc Af Amer: >60
Sodium: 139
Total Protein: 5.7 — ABNORMAL LOW

## 2011-06-29 LAB — COMPREHENSIVE METABOLIC PANEL
ALT: 19
CO2: 23
Calcium: 8.8
Chloride: 105
GFR calc non Af Amer: >60
Glucose, Bld: 157 — ABNORMAL HIGH
Sodium: 133 — ABNORMAL LOW
Total Bilirubin: 0.5

## 2011-06-29 LAB — CBC
Hemoglobin: 7.6 — CL
MCHC: 35
MCV: 94.6
Platelets: 348
RBC: 2.28 — ABNORMAL LOW
RDW: 19.2 — ABNORMAL HIGH
WBC: 7.6

## 2011-06-29 LAB — CROSSMATCH
ABO/RH(D): O POS
Antibody Screen: NEGATIVE

## 2011-06-29 LAB — BASIC METABOLIC PANEL
BUN: 17
Calcium: 9.2
Creatinine, Ser: 0.97
GFR calc non Af Amer: >60
Glucose, Bld: 110 — ABNORMAL HIGH

## 2011-06-29 LAB — DIFFERENTIAL
Basophils Absolute: 0
Basophils Absolute: 0
Basophils Relative: 0
Eosinophils Absolute: 0
Eosinophils Absolute: 0.2
Eosinophils Relative: 3
Lymphocytes Relative: 7 — ABNORMAL LOW
Lymphs Abs: 0.3 — ABNORMAL LOW
Lymphs Abs: 0.6 — ABNORMAL LOW
Neutrophils Relative %: 82 — ABNORMAL HIGH
Neutrophils Relative %: 80 — ABNORMAL HIGH

## 2011-07-02 LAB — CBC
HCT: 36 — ABNORMAL LOW
MCV: 97.1
Platelets: 324
RBC: 3.7 — ABNORMAL LOW
WBC: 7.5

## 2011-07-02 LAB — DIFFERENTIAL
Eosinophils Absolute: 0.5
Lymphocytes Relative: 13
Lymphs Abs: 1
Monocytes Relative: 10
Neutrophils Relative %: 69

## 2011-07-03 LAB — CBC
HCT: 36 — ABNORMAL LOW
MCHC: 35.1
MCV: 97.6
RBC: 3.68 — ABNORMAL LOW
WBC: 5.3

## 2011-07-03 LAB — DIFFERENTIAL
Basophils Absolute: 0
Eosinophils Relative: 2
Lymphocytes Relative: 13
Lymphs Abs: 0.7
Neutro Abs: 3.8
Neutrophils Relative %: 72

## 2011-07-03 LAB — COMPREHENSIVE METABOLIC PANEL
BUN: 19
CO2: 25
Calcium: 9.1
Chloride: 104
Creatinine, Ser: 1.01
GFR calc non Af Amer: >60
Glucose, Bld: 105 — ABNORMAL HIGH
Total Bilirubin: 0.8

## 2011-07-06 LAB — COMPREHENSIVE METABOLIC PANEL
Albumin: 4.1
Alkaline Phosphatase: 84
BUN: 18
CO2: 27
Chloride: 103
GFR calc non Af Amer: >60
Potassium: 4.3
Total Bilirubin: 0.6

## 2011-07-06 LAB — DIFFERENTIAL
Basophils Absolute: 0
Basophils Relative: 1
Eosinophils Relative: 2
Monocytes Absolute: 0.7
Neutro Abs: 4.2

## 2011-07-06 LAB — CBC
HCT: 39.8
Hemoglobin: 13.7
Platelets: 310
RBC: 4.11 — ABNORMAL LOW
WBC: 5.9

## 2011-07-06 LAB — LACTATE DEHYDROGENASE: LDH: 121

## 2011-07-10 LAB — COMPREHENSIVE METABOLIC PANEL
ALT: 17
AST: 15
Albumin: 4.3
Calcium: 9.5
GFR calc Af Amer: >60
Sodium: 138
Total Protein: 6.8

## 2011-07-10 LAB — DIFFERENTIAL
Eosinophils Absolute: 0.1
Eosinophils Relative: 2
Lymphs Abs: 0.9
Monocytes Absolute: 0.7
Monocytes Relative: 11

## 2011-07-10 LAB — CBC
MCHC: 34.5
RDW: 14.2

## 2011-07-13 LAB — CROSSMATCH: Antibody Screen: NEGATIVE

## 2011-07-13 LAB — CBC
MCHC: 34.4 g/dL (ref 30.0–36.0)
MCHC: 34
MCV: 95.9 fL (ref 78.0–100.0)
MCV: 95.6
Platelets: 273 10*3/uL (ref 150–400)
RBC: 3.94 MIL/uL — ABNORMAL LOW (ref 4.22–5.81)
RBC: 2.31 — ABNORMAL LOW

## 2011-07-13 LAB — DIFFERENTIAL
Basophils Relative: 0
Eosinophils Absolute: 0.1 10*3/uL (ref 0.0–0.7)
Eosinophils Absolute: 0 — ABNORMAL LOW
Lymphs Abs: 0.9 10*3/uL (ref 0.7–4.0)
Monocytes Absolute: 0.7
Monocytes Relative: 11 % (ref 3–12)
Monocytes Relative: 10
Neutrophils Relative %: 71 % (ref 43–77)
Neutrophils Relative %: 82 — ABNORMAL HIGH

## 2011-07-13 LAB — COMPREHENSIVE METABOLIC PANEL
AST: 14 U/L (ref 0–37)
CO2: 27 mEq/L (ref 19–32)
Calcium: 9.3 mg/dL (ref 8.4–10.5)
Creatinine, Ser: 1.12 mg/dL (ref 0.4–1.5)
GFR calc Af Amer: >60 mL/min (ref >60)
GFR calc non Af Amer: >60 mL/min (ref >60)

## 2011-07-16 LAB — CBC
Hemoglobin: 10.5 — ABNORMAL LOW
RBC: 3.36 — ABNORMAL LOW
WBC: 9.5

## 2011-07-16 LAB — COMPREHENSIVE METABOLIC PANEL
ALT: 20
AST: 15
Alkaline Phosphatase: 97
CO2: 25
Calcium: 8.5
Chloride: 104
GFR calc Af Amer: >60
GFR calc non Af Amer: >60
Glucose, Bld: 92
Potassium: 4
Sodium: 137
Total Bilirubin: 0.5

## 2011-07-16 LAB — DIFFERENTIAL
Basophils Relative: 1
Eosinophils Absolute: 0 — ABNORMAL LOW
Eosinophils Relative: 0
Lymphs Abs: 1.5

## 2011-07-17 LAB — COMPREHENSIVE METABOLIC PANEL
Albumin: 3.6
BUN: 16
Calcium: 8.5
Glucose, Bld: 100 — ABNORMAL HIGH
Sodium: 138
Total Protein: 5.9 — ABNORMAL LOW

## 2011-07-17 LAB — DIFFERENTIAL
Basophils Absolute: 0
Basophils Absolute: 0
Eosinophils Absolute: 0 — ABNORMAL LOW
Eosinophils Absolute: 0 — ABNORMAL LOW
Eosinophils Relative: 0
Lymphs Abs: 1.5
Lymphs Abs: 1.6
Monocytes Absolute: 0 — ABNORMAL LOW
Monocytes Relative: 0 — ABNORMAL LOW
Monocytes Relative: 7
Neutro Abs: 10.8 — ABNORMAL HIGH
Neutro Abs: 29 — ABNORMAL HIGH
Neutrophils Relative %: 36 — ABNORMAL LOW
Neutrophils Relative %: 80 — ABNORMAL HIGH

## 2011-07-17 LAB — CROSSMATCH: ABO/RH(D): O POS

## 2011-07-17 LAB — BASIC METABOLIC PANEL
BUN: 24 — ABNORMAL HIGH
CO2: 27
Chloride: 100
Chloride: 102
Creatinine, Ser: 0.87
Glucose, Bld: 124 — ABNORMAL HIGH
Potassium: 3.7
Sodium: 137

## 2011-07-17 LAB — CBC
HCT: 28.8 — ABNORMAL LOW
HCT: 31.5 — ABNORMAL LOW
Hemoglobin: 10.8 — ABNORMAL LOW
Hemoglobin: 9.7 — ABNORMAL LOW
MCHC: 33.5
MCHC: 33.6
MCHC: 34.2
MCV: 91.2
MCV: 91.2
Platelets: 432 — ABNORMAL HIGH
Platelets: 94 — ABNORMAL LOW
RDW: 21 — ABNORMAL HIGH
RDW: 22.6 — ABNORMAL HIGH
WBC: 1.9 — ABNORMAL LOW

## 2011-07-17 LAB — ABO/RH: ABO/RH(D): O POS

## 2011-07-18 LAB — DIFFERENTIAL
Basophils Absolute: 0
Basophils Relative: 1
Eosinophils Absolute: 0
Neutro Abs: 1.9
Neutrophils Relative %: 56

## 2011-07-18 LAB — CBC
MCHC: 34.8
RDW: 15.3 — ABNORMAL HIGH

## 2011-07-19 LAB — DIFFERENTIAL
Basophils Absolute: 0
Basophils Absolute: 0
Basophils Absolute: 0
Basophils Relative: 0
Eosinophils Absolute: 0
Eosinophils Absolute: 0
Eosinophils Relative: 0
Lymphocytes Relative: 9 — ABNORMAL LOW
Lymphocytes Relative: 46
Lymphocytes Relative: 8 — ABNORMAL LOW
Lymphs Abs: 1.2
Lymphs Abs: 3
Monocytes Absolute: 1.2 — ABNORMAL HIGH
Monocytes Absolute: 0.2
Monocytes Relative: 10
Monocytes Relative: 1 — ABNORMAL LOW
Neutro Abs: 10.2 — ABNORMAL HIGH
Neutro Abs: 1.3 — ABNORMAL LOW
Neutro Abs: 9.7 — ABNORMAL HIGH
Neutrophils Relative %: 81 — ABNORMAL HIGH
Neutrophils Relative %: 81 — ABNORMAL HIGH

## 2011-07-19 LAB — CBC
HCT: 30.9 — ABNORMAL LOW
HCT: 33.5 — ABNORMAL LOW
HCT: 33.7 — ABNORMAL LOW
Hemoglobin: 9.3 — ABNORMAL LOW
Hemoglobin: 10.5 — ABNORMAL LOW
Hemoglobin: 11.5 — ABNORMAL LOW
MCV: 87.4
Platelets: 246
RBC: 3.12 — ABNORMAL LOW
RBC: 3.54 — ABNORMAL LOW
RBC: 3.85 — ABNORMAL LOW
RDW: 15 — ABNORMAL HIGH
RDW: 13.4
RDW: 14
WBC: 2.6 — ABNORMAL LOW
WBC: 36 — ABNORMAL HIGH

## 2011-07-19 LAB — COMPREHENSIVE METABOLIC PANEL
ALT: 37
Alkaline Phosphatase: 103
BUN: 9
CO2: 24
GFR calc non Af Amer: >60
Glucose, Bld: 127 — ABNORMAL HIGH
Potassium: 3.8
Sodium: 135
Total Bilirubin: 0.4
Total Protein: 6

## 2011-07-19 LAB — BASIC METABOLIC PANEL
BUN: 19
Calcium: 8.3 — ABNORMAL LOW
GFR calc non Af Amer: >60
Glucose, Bld: 94
Potassium: 3.7
Sodium: 134 — ABNORMAL LOW

## 2011-07-20 LAB — BASIC METABOLIC PANEL
BUN: 9
Calcium: 9
GFR calc non Af Amer: >60
Glucose, Bld: 89

## 2011-07-20 LAB — CBC
HCT: 38.2 — ABNORMAL LOW
HCT: 39
HCT: 40.1
Hemoglobin: 13
Hemoglobin: 13.8
MCHC: 33.7
MCHC: 33.5
MCV: 87.7
MCV: 88.2
MCV: 88.4
Platelets: 666 — ABNORMAL HIGH
RBC: 4.13 — ABNORMAL LOW
RBC: 4.35
RDW: 13.6
RDW: 13.3
RDW: 14.1 — ABNORMAL HIGH
WBC: 2.5 — ABNORMAL LOW

## 2011-07-20 LAB — DIFFERENTIAL
Basophils Absolute: 0
Basophils Relative: 0
Eosinophils Absolute: 0
Eosinophils Absolute: 0
Lymphocytes Relative: 18
Lymphs Abs: 1.6
Monocytes Absolute: 0.1 — ABNORMAL LOW
Monocytes Absolute: 0.8 — ABNORMAL HIGH
Monocytes Absolute: 0.7
Monocytes Relative: 3
Monocytes Relative: 5
Monocytes Relative: 9
Neutro Abs: 6.2
Neutrophils Relative %: 31 — ABNORMAL LOW
Neutrophils Relative %: 78 — ABNORMAL HIGH

## 2011-07-20 LAB — COMPREHENSIVE METABOLIC PANEL
Albumin: 3.7
Alkaline Phosphatase: 89
BUN: 11
BUN: 13
Calcium: 9
Creatinine, Ser: 1.17
GFR calc non Af Amer: >60
Glucose, Bld: 99
Potassium: 5.3 — ABNORMAL HIGH
Total Bilirubin: 0.5
Total Bilirubin: 0.7
Total Protein: 6.5
Total Protein: 6.7

## 2011-07-20 LAB — TYPE AND SCREEN: Antibody Screen: NEGATIVE

## 2011-07-20 LAB — CULTURE, RESPIRATORY W GRAM STAIN

## 2011-07-20 LAB — FUNGUS CULTURE W SMEAR

## 2011-07-20 LAB — PROTIME-INR
INR: 1
Prothrombin Time: 13.2

## 2011-07-20 LAB — AFB CULTURE WITH SMEAR (NOT AT ARMC): Acid Fast Smear: NONE SEEN

## 2011-07-23 LAB — POCT I-STAT CREATININE
Creatinine, Ser: 1.1
Operator id: 215651

## 2011-07-30 ENCOUNTER — Encounter (HOSPITAL_COMMUNITY): Payer: BC Managed Care – PPO | Attending: Oncology

## 2011-07-30 DIAGNOSIS — C3410 Malignant neoplasm of upper lobe, unspecified bronchus or lung: Secondary | ICD-10-CM

## 2011-07-30 DIAGNOSIS — Z85118 Personal history of other malignant neoplasm of bronchus and lung: Secondary | ICD-10-CM | POA: Insufficient documentation

## 2011-07-30 DIAGNOSIS — Z09 Encounter for follow-up examination after completed treatment for conditions other than malignant neoplasm: Secondary | ICD-10-CM | POA: Insufficient documentation

## 2011-07-30 LAB — COMPREHENSIVE METABOLIC PANEL
ALT: 18 U/L (ref 0–53)
AST: 14 U/L (ref 0–37)
Albumin: 3.7 g/dL (ref 3.5–5.2)
Alkaline Phosphatase: 72 U/L (ref 39–117)
CO2: 25 mEq/L (ref 19–32)
Chloride: 104 mEq/L (ref 96–112)
GFR calc non Af Amer: 78 mL/min — ABNORMAL LOW (ref >90)
Potassium: 3.9 mEq/L (ref 3.5–5.1)
Total Bilirubin: 0.4 mg/dL (ref 0.3–1.2)

## 2011-07-30 LAB — DIFFERENTIAL
Basophils Absolute: 0 10*3/uL (ref 0.0–0.1)
Basophils Relative: 0 % (ref 0–1)
Lymphocytes Relative: 16 % (ref 12–46)
Monocytes Absolute: 0.6 10*3/uL (ref 0.1–1.0)
Neutro Abs: 4.5 10*3/uL (ref 1.7–7.7)
Neutrophils Relative %: 73 % (ref 43–77)

## 2011-07-30 LAB — CBC
MCH: 31.2 pg (ref 26.0–34.0)
MCHC: 33.5 g/dL (ref 30.0–36.0)
RDW: 13.8 % (ref 11.5–15.5)

## 2011-07-30 NOTE — Progress Notes (Signed)
Jeremy Lopez presented for labwork. Labs per MD order drawn via peripheral stick rt ac. Labs drawn for cbc diff and cmet. Procedure without incident.  Patient tolerated procedure well.

## 2011-07-31 ENCOUNTER — Ambulatory Visit (HOSPITAL_COMMUNITY)
Admission: RE | Admit: 2011-07-31 | Discharge: 2011-07-31 | Disposition: A | Discharge status: Home / Self Care | Payer: BC Managed Care – PPO | Source: Ambulatory Visit | Attending: Oncology | Admitting: Oncology

## 2011-07-31 DIAGNOSIS — C3490 Malignant neoplasm of unspecified part of unspecified bronchus or lung: Secondary | ICD-10-CM | POA: Insufficient documentation

## 2011-07-31 DIAGNOSIS — C3410 Malignant neoplasm of upper lobe, unspecified bronchus or lung: Secondary | ICD-10-CM

## 2011-07-31 DIAGNOSIS — Z923 Personal history of irradiation: Secondary | ICD-10-CM | POA: Insufficient documentation

## 2011-07-31 DIAGNOSIS — Z9221 Personal history of antineoplastic chemotherapy: Secondary | ICD-10-CM | POA: Insufficient documentation

## 2011-07-31 MED ORDER — IOHEXOL 300 MG/ML  SOLN
100.0000 mL | Freq: Once | INTRAMUSCULAR | Status: AC | PRN
  Administered 2011-07-31: 100 mL via INTRAVENOUS

## 2011-08-01 ENCOUNTER — Telehealth (HOSPITAL_COMMUNITY): Payer: Self-pay | Admitting: *Deleted

## 2011-08-01 NOTE — Telephone Encounter (Signed)
Message copied by Dennie Maizes on Wed Aug 01, 2011 11:46 AM ------      Message from: Mariel Sleet, ERIC S      Created: Tue Jul 31, 2011  6:31 PM       Negative CTs-call him!

## 2011-08-01 NOTE — Telephone Encounter (Signed)
Spoke with JD as below. Verbalized understanding.

## 2011-08-09 ENCOUNTER — Encounter: Payer: Self-pay | Admitting: Adult Health

## 2011-08-13 ENCOUNTER — Ambulatory Visit (INDEPENDENT_AMBULATORY_CARE_PROVIDER_SITE_OTHER): Payer: BC Managed Care – PPO | Admitting: Adult Health

## 2011-08-13 ENCOUNTER — Encounter: Payer: Self-pay | Admitting: Adult Health

## 2011-08-13 VITALS — BP 128/86 | HR 67 | Resp 16 | Ht 70.0 in | Wt 209.0 lb

## 2011-08-13 DIAGNOSIS — E785 Hyperlipidemia, unspecified: Secondary | ICD-10-CM

## 2011-08-13 DIAGNOSIS — I10 Essential (primary) hypertension: Secondary | ICD-10-CM

## 2011-08-13 DIAGNOSIS — I2510 Atherosclerotic heart disease of native coronary artery without angina pectoris: Secondary | ICD-10-CM

## 2011-08-13 NOTE — Progress Notes (Signed)
HPI:  Jeremy Lopez is a  60 y/o patient of Dr. 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  He comes today without complaints with the exception of PVC's on occasion. He denies chest pain, shortness of breath. He has continued smoking cessation for over 2 years.  He remains active and wishes to return to work.  Allergies  Allergen Reactions  . Cephalexin     REACTION: throat swelling    Current Outpatient Prescriptions  Medication Sig Dispense Refill  . aspirin 81 MG tablet Take 81 mg by mouth daily.       Marland Kitchen diltiazem (CARDIZEM CD) 180 MG 24 hr capsule Take 180 mg by mouth daily.        . metoprolol (TOPROL-XL) 50 MG 24 hr tablet TAKE 1 TABLET EVERY DAY  30 tablet  6  . rosuvastatin (CRESTOR) 40 MG tablet Take 1 tablet (40 mg total) by mouth daily.  30 tablet  6    Past Medical History  Diagnosis Date  . ASCVD (arteriosclerotic cardiovascular disease)      DES placed for 95% proximal LAD stenosis in 07/2009; scattered 20-40% lesions; normal EF  . Carcinoma     RIGHT UPPER LOBE; SMALL CELL-2008; no evidence of disease since treatment ended in 11/2007  . Hypertension   . Hyperlipidemia     Past Surgical History  Procedure Date  . Cardiac catheterization  06/30/09     (endeaver 2.12mm x 18mm drug eluting stent)  . Right upper lobe small cell carcinoma     HYQ:MVHQIO of systems complete and found to be negative unless listed above PHYSICAL EXAM BP 128/86  Pulse 67  Resp 16  Ht 5\' 10"  (1.778 m)  Wt 209 lb (94.802 kg)  BMI 29.99 kg/m2  General: Well developed, well nourished, in no acute distress Head: Eyes PERRLA, No xanthomas.   Normal cephalic and atramatic  Lungs: Clear bilaterally to  auscultation and percussion. Heart: HRRR S1 S2, without MRG.  Pulses are 2+ & equal.            No carotid bruit. No JVD.  No abdominal bruits. No femoral bruits. Abdomen: Bowel sounds are positive, abdomen soft and non-tender without masses or                  Hernia's noted. Msk:  Back normal, normal gait. Normal strength and tone for age. Extremities: No clubbing, cyanosis or edema.  DP +1 Neuro: Alert and oriented X 3. Psych:  Good affect, responds appropriately  EKG: NSR rate of 69 bpm. ASSESSMENT AND PLAN

## 2011-08-13 NOTE — Assessment & Plan Note (Signed)
He is due for lipids and LFTs next week for ongoing assessment.  Will follow

## 2011-08-13 NOTE — Assessment & Plan Note (Signed)
He is doing well from our standpoint and is asymptomatic. He is medically compliant. EKG is normal.  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 He will continue current medications.  Follow in one year.

## 2011-08-13 NOTE — Assessment & Plan Note (Signed)
Currently well controlled. No changes in medications.  

## 2011-08-13 NOTE — Patient Instructions (Signed)
**Note De-identified Jeremy Lopez Obfuscation** Your physician recommends that you continue on your current medications as directed. Please refer to the Current Medication list given to you today.  Your physician recommends that you schedule a follow-up appointment in: 1 year  

## 2011-09-19 ENCOUNTER — Other Ambulatory Visit: Payer: Self-pay | Admitting: Cardiology

## 2011-10-11 ENCOUNTER — Other Ambulatory Visit: Payer: Self-pay | Admitting: Cardiology

## 2011-10-17 ENCOUNTER — Encounter (HOSPITAL_COMMUNITY): Payer: BC Managed Care – PPO | Attending: Oncology | Admitting: Oncology

## 2011-10-17 ENCOUNTER — Ambulatory Visit (HOSPITAL_COMMUNITY): Payer: BC Managed Care – PPO | Admitting: Oncology

## 2011-10-17 VITALS — BP 113/75 | HR 97 | Temp 97.0°F | Ht 70.0 in | Wt 214.0 lb

## 2011-10-17 DIAGNOSIS — C3410 Malignant neoplasm of upper lobe, unspecified bronchus or lung: Secondary | ICD-10-CM | POA: Insufficient documentation

## 2011-10-17 DIAGNOSIS — I10 Essential (primary) hypertension: Secondary | ICD-10-CM

## 2011-10-17 DIAGNOSIS — R110 Nausea: Secondary | ICD-10-CM

## 2011-10-17 DIAGNOSIS — R51 Headache: Secondary | ICD-10-CM

## 2011-10-17 NOTE — Patient Instructions (Signed)
Jeremy Lopez  [id number] 01-17-1951   [facility]  Discharge Instructions  RECOMMENDATIONS MADE BY THE CONSULTANT AND ANY TEST RESULTS WILL BE SENT TO YOUR REFERRING DOCTOR.   EXAM FINDINGS BY MD TODAY AND SIGNS AND SYMPTOMS TO REPORT TO CLINIC OR PRIMARY MD: will do MRI of your brain on 1/22 at 10:15am.  If anything abnormal we will call you.  MEDICATIONS PRESCRIBED: none   INSTRUCTIONS GIVEN AND DISCUSSED: Report any new bone pain or shortness of breath  SPECIAL INSTRUCTIONS/FOLLOW-UP: Lab work Needed 04/11/12, Xray Studies Needed 10/30/11 at 10:15 and Return to Clinic on 79/13 at 10am   I acknowledge that I have been informed and understand all the instructions given to me and received a copy. I do not have any more questions at this time, but understand that I may call the [facility] at [facility] at [phone] during business hours should I have any further questions or need assistance in obtaining follow-up care.    __________________________________________  _____________  __________ Signature of Patient or Authorized Representative            Date                   Time    __________________________________________ Nurse's Signature

## 2011-10-17 NOTE — Progress Notes (Signed)
[name], MD [address] [place] [address]  1. CARCINOMA, RIGHT LUNG, SMALL CELL, UPPER LOBE  CBC, Differential, Comprehensive metabolic panel, MR Brain W Contrast    CURRENT THERAPY:S/P 6 cycles of cisplatin, VP-16, and CCNU alternating with carboplatin, VP-16, and Adriamycin with involved field radiation to chest with cycle #5 and #6.  Also S/P prophylactic cranial radiation.  Last chemotherapy was on 10/17/2007 and he is in complete remission.   INTERVAL HISTORY: Jeremy Lopez 61 y.o. male returns for  regular  visit for followup of  Small Cell lung cancer, limited stage disease, Diagnosed in 2008.  S/P 6 cycles of cisplatin, VP-16, and CCNU alternating with carboplatin, VP-16, and Adriamycin with involved field radiation to chest with cycle #5 and #6.  Also S/P prophylactic cranial radiation.  Last chemotherapy was on 10/17/2007 and he is in complete remission.  The patient reports some episodes of headaches and nausea.  He explains that these occur intermittently and resolve with minimal intervention.  In light of the patient's small cell carcinoma of the lung, it would be beneficial to perform an MRI of the Brain with contrast to rule out metastatic disease.  Otherwise, the patient is doing well.  Oncologically, the patient denies any complaints.    I personally reviewed and went over radiographic studies with the patient.  His CT of CAP was negative for recurrence and metastatic disease.  I printed him a copy of the report to go home with.    I personally reviewed and went over laboratory results with the patient.  His lab work is very stable.  The patient informs [place] that his wife unfortunately suffers from severe depression and he experienced an episode of her becoming extremely depressed when she was weaning off of Paxil and starting a new anti-depressant.  She is much better now.   We will plan on doing yearly surveillance with CT CAP, he will be due in October  2013.  We will perform lab work in 6 months as well.  The patient will follow-up with Dr. Karilyn Cota for his first screening colonoscopy.  We stressed the importance of this screening procedure.   Past Medical History  Diagnosis Date  . ASCVD (arteriosclerotic cardiovascular disease)      DES placed for 95% proximal LAD stenosis in 07/2009; scattered 20-40% lesions; normal EF  . Carcinoma     RIGHT UPPER LOBE; SMALL CELL-2008; no evidence of disease since treatment ended in 11/2007  . Hypertension   . Hyperlipidemia     has CARCINOMA, RIGHT LUNG, SMALL CELL, UPPER LOBE; HYPERLIPIDEMIA; HYPERTENSION; and ATHEROSCLEROTIC CARDIOVASCULAR DISEASE on his problem list.     is allergic to cephalexin.  [name] does not currently have medications on file.  Past Surgical History  Procedure Date  . Cardiac catheterization  06/30/09     (endeaver 2.61mm x 18mm drug eluting stent)  . Right upper lobe small cell carcinoma     Denies any headaches, dizziness, double vision, fevers, chills, night sweats, nausea, vomiting, diarrhea, constipation, chest pain, heart palpitations, shortness of breath, blood in stool, black tarry stool, urinary pain, urinary burning, urinary frequency, hematuria.   PHYSICAL EXAMINATION  ECOG PERFORMANCE STATUS: 0 - Asymptomatic  Filed Vitals:   10/17/11 0933  BP: 113/75  Pulse: 97  Temp: 97 F (36.1 C)    GENERAL:alert, healthy, no distress, well nourished, well developed, comfortable, cooperative and smiling SKIN: skin color, texture, turgor are normal, no rashes or significant lesions  HEAD: Normocephalic, No masses, lesions, tenderness or abnormalities EYES: normal EARS: External ears normal OROPHARYNX:mucous membranes are moist  NECK: supple, no adenopathy, no bruits, thyroid normal size, non-tender, without nodularity, no stridor, non-tender, trachea midline LYMPH:  no palpable lymphadenopathy, no hepatosplenomegaly, liver edge palpated on deep  inspiration. BREAST:not examined LUNGS: clear to auscultation and percussion HEART: regular rate & rhythm, no murmurs, no gallops, S1 normal and S2 normal ABDOMEN:abdomen soft, non-tender, normal bowel sounds and no hepatosplenomegaly BACK: Back symmetric, no curvature., No CVA tenderness, Range of motion is normal EXTREMITIES:less then 2 second capillary refill, no joint deformities, effusion, or inflammation, no edema, no skin discoloration, no clubbing, no cyanosis  NEURO: alert & oriented x 3 with fluent speech, no focal motor/sensory deficits, gait normal   LABORATORY DATA: CBC    Component Value Date/Time   WBC 6.1 07/30/2011 1001   WBC 11.6* 10/14/2007 1541   RBC 4.14* 07/30/2011 1001   RBC 3.65* 10/14/2007 1541   HGB 12.9* 07/30/2011 1001   HGB 11.8* 10/14/2007 1541   HCT 38.5* 07/30/2011 1001   HCT 35.2* 10/14/2007 1541   PLT 244 07/30/2011 1001   PLT 263 10/14/2007 1541   MCV 93.0 07/30/2011 1001   MCV 96.4 10/14/2007 1541   MCH 31.2 07/30/2011 1001   MCH 32.4 10/14/2007 1541   MCHC 33.5 07/30/2011 1001   MCHC 33.7 10/14/2007 1541   RDW 13.8 07/30/2011 1001   RDW 20.5* 10/14/2007 1541   LYMPHSABS 1.0 07/30/2011 1001   LYMPHSABS 0.1* 10/14/2007 1541   MONOABS 0.6 07/30/2011 1001   MONOABS 0.2 10/14/2007 1541   EOSABS 0.1 07/30/2011 1001   EOSABS 0.0 10/14/2007 1541   BASOSABS 0.0 07/30/2011 1001   BASOSABS 0.0 10/14/2007 1541      Chemistry      Component Value Date/Time   NA 138 07/30/2011 1001   K 3.9 07/30/2011 1001   CL 104 07/30/2011 1001   CO2 25 07/30/2011 1001   BUN 15 07/30/2011 1001   CREATININE 1.02 07/30/2011 1001      Component Value Date/Time   CALCIUM 8.9 07/30/2011 1001   ALKPHOS 72 07/30/2011 1001   AST 14 07/30/2011 1001   ALT 18 07/30/2011 1001   BILITOT 0.4 07/30/2011 1001       RADIOGRAPHIC STUDIES:  07/31/11  *RADIOLOGY REPORT*  Clinical Data: Follow-up small cell lung cancer, status post  chemotherapy/radiation  CT CHEST, ABDOMEN AND PELVIS  WITH CONTRAST  Technique: Multidetector CT imaging of the chest, abdomen and  pelvis was performed following the standard protocol during bolus  administration of intravenous contrast.  Contrast: OMNIPAQUE IOHEXOL 300 MG/ML IV SOLN  Comparison: 08/01/2010  CT CHEST  Findings: Right paramediastinal radiation changes. No  new/suspicious pulmonary nodules. No pleural effusion or  pneumothorax.  Small mediastinal lymph nodes which do not meet pathologic CT size  criteria. No suspicious hilar or axillary lymphadenopathy.  Visualized thyroid is unremarkable.  The heart is normal in size. No pericardial effusion. Coronary  atherosclerosis status post stent. Atherosclerotic calcifications  of the aortic arch.  Left subclavian chest port terminating in the mid SVC.  Mild degenerative changes of the thoracic spine. No focal osseous  lesions.  IMPRESSION:  Right paramediastinal radiation changes.  No evidence of recurrent/metastatic disease in the chest.  CT ABDOMEN AND PELVIS  Findings: Liver, spleen, pancreas, and adrenal glands are within  normal limits.  Gallbladder is unremarkable. No intrahepatic or extrahepatic  ductal dilatation.  Kidneys are within normal limits. No  hydronephrosis.  No evidence of bowel obstruction. Normal appendix. Colonic  diverticulosis, without associated inflammatory changes.  Atherosclerotic calcifications of the abdominal aorta and branch  vessels.  No abdominopelvic ascites.  No suspicious abdominopelvic lymphadenopathy.  Prostate is notable for dystrophic calcifications.  The bladder is within normal limits.  Very mild degenerative changes of the lumbar spine. No focal  osseous lesions.  IMPRESSION:  No evidence of metastatic disease in the abdomen/pelvis.  Original Report Authenticated By: Charline Bills, M.D.   PATHOLOGY: 05/27/2007  REPORT OF SURGICAL PATHOLOGY  Case #: [id number] Patient Name: TYREECE, GELLES. Office Chart Number:  N/[id number]  MRN: [id number] Pathologist: Ferd Hibbs. Colonel Bald, MD DOB/Age 03/02/1951 (Age: 56) Gender: M Date Taken: 05/27/2007 Date Received: 05/27/2007  FINAL DIAGNOSIS  MICROSCOPIC EXAMINATION AND DIAGNOSIS  1. LYMPH NODE, 2R, BIOPSY: SMALL CELL CARCINOMA.  2. LUNG, RIGHT UPPER LOBE, BIOPSY: - FIBROVASCULAR TISSUE WITH DETACHED FRAGMENTS OF RED BLOOD CELLS AND INFLAMMATORY CELLS. - DEFINITE MALIGNANCY IS NOT IDENTIFIED. - SEE COMMENT.  3. LYMPH NODE, 2R, BIOPSY: SMALL CELL CARCINOMA.   REPORT OF CYTOPATHOLOGY  Case #: [id number] Patient Name: KING, PINZON. Office Chart Number: N/A  MRN: [id number] Pathologist: Ferd Hibbs. Colonel Bald, MD DOB/Age 08-Dec-1950 (Age: 70) Gender: M Date Taken: 05/27/2007 Date Received: 05/27/2007  FINAL DIAGNOSIS Microscopic Examination and Diagnosis STATEMENT OF SPECIMEN ADEQUACY: Satisfactory for evaluation.  INTERPRETATION(S): BRONCHIAL BRUSHING (SMEAR): MALIGNANT CELLS IDENTIFIED, SEE COMMENT   COMMENT The specimen shows scattered highly atypical cells with mildly enlarged nuclei with granular chromatin. Some groups of cells show suggestions of cell to cell molding. These malignant cells are most suggestive of a small cell carcinoma. Please see the patient' s accompanying surgical specimen [id number]. (JBK:kv 05-28-07)  DATE REPORTED: 05/28/2007 Ferd Hibbs. Colonel Bald, MD    ASSESSMENT:  1. Small Cell lung cancer, limited stage, diagnosed in 2008.  S/P 6 cycles of cisplatin, VP-16, and CCNU alternating with carboplatin, VP-16, and Adriamycin with involved field radiation to chest with cycle #5 and #6.  Also S/P prophylactic cranial radiation.  Last chemotherapy was on 10/17/2007 and he is in complete remission. 2. CAD, S/P cardiac catheterization with stent placement in September 2010. 3. HTN 4. Mild airway obstructive disease followed by Dr. Shaune Pollack in the past.   PLAN:  1. MRI of brain with contrast to evaluate for metastatic disease in light of headaches  and nausea. 2. I personally reviewed and went over laboratory results with the patient. 3. I personally reviewed and went over radiographic studies with the patient. 4. Lab work in 6 months: CBC diff, CMET 5. Return in 6 months for lab work and physical exam 6. At return appointment, will order CT CAP for October 2013 for yearly surveillance.   All questions were answered. The patient knows to call the clinic with any problems, questions or concerns. We can certainly see the patient much sooner if necessary.  The patient and plan discussed with Glenford Peers, MD and he is in agreement with the aforementioned.  Patient also seen by Dr. Glenford Peers.   Nicolasa Milbrath

## 2011-10-30 ENCOUNTER — Telehealth (HOSPITAL_COMMUNITY): Payer: Self-pay

## 2011-10-30 ENCOUNTER — Ambulatory Visit (HOSPITAL_COMMUNITY)
Admission: RE | Admit: 2011-10-30 | Discharge: 2011-10-30 | Disposition: A | Discharge status: Home / Self Care | Payer: BC Managed Care – PPO | Source: Ambulatory Visit | Attending: Oncology | Admitting: Oncology

## 2011-10-30 ENCOUNTER — Other Ambulatory Visit (HOSPITAL_COMMUNITY): Payer: Self-pay | Admitting: Oncology

## 2011-10-30 DIAGNOSIS — R110 Nausea: Secondary | ICD-10-CM | POA: Insufficient documentation

## 2011-10-30 DIAGNOSIS — C3410 Malignant neoplasm of upper lobe, unspecified bronchus or lung: Secondary | ICD-10-CM

## 2011-10-30 DIAGNOSIS — R51 Headache: Secondary | ICD-10-CM | POA: Insufficient documentation

## 2011-10-30 DIAGNOSIS — C3490 Malignant neoplasm of unspecified part of unspecified bronchus or lung: Secondary | ICD-10-CM | POA: Insufficient documentation

## 2011-10-30 LAB — POCT I-STAT, CHEM 8
Creatinine, Ser: 1.2 mg/dL (ref 0.50–1.35)
Glucose, Bld: 94 mg/dL (ref 70–99)
HCT: 44 % (ref 39.0–52.0)
Hemoglobin: 15 g/dL (ref 13.0–17.0)
TCO2: 26 mmol/L (ref 0–100)

## 2011-10-30 MED ORDER — GADOBENATE DIMEGLUMINE 529 MG/ML IV SOLN
20.0000 mL | Freq: Once | INTRAVENOUS | Status: AC | PRN
  Administered 2011-10-30: 20 mL via INTRAVENOUS

## 2011-10-30 NOTE — Telephone Encounter (Signed)
Message left that MRI was negative.  To call back if any questions.

## 2011-10-30 NOTE — Telephone Encounter (Signed)
Message copied by Sterling Big on Tue Oct 30, 2011  4:05 PM ------      Message from: Mariel Sleet, ERIC S      Created: Tue Oct 30, 2011  3:48 PM       Call him MRI negative!!

## 2011-11-16 ENCOUNTER — Ambulatory Visit (INDEPENDENT_AMBULATORY_CARE_PROVIDER_SITE_OTHER): Payer: BC Managed Care – PPO | Admitting: Adult Health

## 2011-11-16 ENCOUNTER — Encounter: Payer: Self-pay | Admitting: Adult Health

## 2011-11-16 VITALS — BP 127/81 | HR 72 | Resp 16 | Ht 70.0 in | Wt 213.0 lb

## 2011-11-16 DIAGNOSIS — I10 Essential (primary) hypertension: Secondary | ICD-10-CM

## 2011-11-16 DIAGNOSIS — I2510 Atherosclerotic heart disease of native coronary artery without angina pectoris: Secondary | ICD-10-CM

## 2011-11-16 NOTE — Progress Notes (Signed)
HPI:  Jeremy Lopez is a  61 y/o patient of Dr. 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  He comes today without complaints with the exception of PVC's on occasion. He denies chest pain, shortness of breath. He has continued smoking cessation for over 2 years.  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 He comes today with a letter from [employer] requesting a follow-up stress test to complete paperwork to get his license back. Apparently, although he has had numerous stress tests and a recent cardiac cath in 2011, the FAA needs one yearly for their records. He is here to request this test. He is without cardiac complaints.  Allergies  Allergen Reactions  . Cephalexin     REACTION: throat swelling    Current Outpatient Prescriptions  Medication Sig Dispense Refill  . aspirin 81 MG tablet Take 81 mg by mouth daily.       Marland Kitchen diltiazem (CARDIZEM CD) 180 MG 24 hr capsule TAKE ONE CAPSULE BY MOUTH DAILY  90 capsule  3  . metoprolol (TOPROL-XL) 50 MG 24 hr tablet TAKE 1 TABLET EVERY DAY  30 tablet  1  . rosuvastatin (CRESTOR) 40 MG tablet Take 1 tablet (40 mg total) by mouth daily.  30 tablet  6    Past Medical History  Diagnosis Date  . ASCVD (arteriosclerotic cardiovascular disease)      DES placed for 95% proximal LAD stenosis in 07/2009; scattered 20-40% lesions; normal EF  . Carcinoma     RIGHT UPPER LOBE; SMALL CELL-2008; no evidence of disease since treatment ended in 11/2007  . Hypertension   . Hyperlipidemia     Past Surgical History  Procedure Date  . Cardiac catheterization  06/30/09     (endeaver 2.28mm x 18mm drug eluting stent)  . Right upper lobe small cell carcinoma      WUJ:WJXBJY of systems complete and found to be negative unless listed above PHYSICAL EXAM BP 127/81  Pulse 72  Resp 16  Ht 5\' 10"  (1.778 m)  Wt 213 lb (96.616 kg)  BMI 30.56 kg/m2  General: Well developed, well nourished, in no acute distress Head: Eyes PERRLA, No xanthomas.   Normal cephalic and atramatic  Lungs: Clear bilaterally to auscultation and percussion. Heart: HRRR S1 S2, without MRG.  Pulses are 2+ & equal.            No carotid bruit. No JVD.  No abdominal bruits. No femoral bruits. Abdomen: Bowel sounds are positive, abdomen soft and non-tender without masses or                  Hernia's noted. Msk:  Back normal, normal gait. Normal strength and tone for age. Extremities: No clubbing, cyanosis or edema.  DP +1 Neuro: Alert and oriented X 3. Psych:  Good affect, responds appropriately  EKG: NSR rate of 69 bpm. ASSESSMENT AND PLAN

## 2011-11-16 NOTE — Assessment & Plan Note (Addendum)
He has had multiple stress tests and a recent cardiac cath, but requires another stress test to qualify for his license. Review of the letter from FAA is dated 12/06/2010, however it states that he will need stress test "on or about Sept 2013" Most recent stress test was completed 02/2010. Negative for ischemia,  Cath in 2011 demonstrated widely patent LAD with mild diffuse in-stent restenosis. Will plan a GXT for evaluation with copies to the FAA once completed.  I have spoken to [facility] with the [facility] Certification Division, [phone] who confirms that subsequent testing has to be completed. Sept 2012 a GXT should have been completed, Sept 2013 a myoview has to be completed in order to have his license renewed. He did not have the stress test completed for Sept 2012 and his license was suspended in November of 2012. We will send the stress test results directly to the FAA at a FAX number [phone] once GXT is completed. Will keep a copy of the FAA letter for our records.

## 2011-11-16 NOTE — Patient Instructions (Signed)
Your physician has requested that you have an exercise tolerance test. For further information please visit [email or web address] Please also follow instruction sheet, as given.  Your physician recommends that you schedule a follow-up appointment in: 12 months

## 2011-11-16 NOTE — Assessment & Plan Note (Signed)
Currently well controlled. No changes in his medical regimen.

## 2011-11-21 ENCOUNTER — Encounter: Payer: BC Managed Care – PPO | Admitting: *Deleted

## 2011-11-22 ENCOUNTER — Encounter: Payer: BC Managed Care – PPO | Admitting: *Deleted

## 2011-11-26 ENCOUNTER — Ambulatory Visit (INDEPENDENT_AMBULATORY_CARE_PROVIDER_SITE_OTHER): Payer: BC Managed Care – PPO | Admitting: *Deleted

## 2011-11-26 ENCOUNTER — Encounter (HOSPITAL_COMMUNITY): Payer: Self-pay | Admitting: Cardiology

## 2011-11-26 DIAGNOSIS — I2510 Atherosclerotic heart disease of native coronary artery without angina pectoris: Secondary | ICD-10-CM

## 2011-11-26 NOTE — Progress Notes (Signed)
Stress Lab Nurses Notes - Jeremy Lopez 11/26/2011  Reason for doing test: CAD Type of test: Regular GTX Nurse performing test: Parke Poisson, RN Nuclear Medicine Tech: Not Applicable Echo Tech: Not Applicable MD performing test: R. Dietrich Pates Family MD: Ouida Sills Test explained and consent signed: yes IV started: No IV started Symptoms: None Treatment/Intervention: None Reason test stopped: reached target HR After recovery IV was: None Patient to return to Nuc. Med at : NA Patient discharged: Home Patient's Condition upon discharge was: stable Comments:  During test peak BP 192/78 & HR 160.  Recovery BP 140/72 & HR 102.  No Symptoms noted in recovery. [name]

## 2011-12-31 ENCOUNTER — Encounter: Payer: Self-pay | Admitting: Cardiology

## 2011-12-31 NOTE — Progress Notes (Signed)
Graded Exercise Test-Interpretation  11/26/11  Clinical data: 61 year old gentleman with a history of percutaneous intervention for coronary artery disease and 2010.  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  Exercise discontinued due to dyspnea and fatigue; no chest discomfort reported.  Blood pressure increased from a resting value of 140/80 to 190/80 at peak exercise.  No arrhythmias noted.  EKG: Normal sinus rhythm, within normal limits Stress EKG:  Insignificant upsloping ST segment depression. Impression:  Negative and adequate graded exercise test with excellent exercise tolerance, achievement of 100% of age-predicted maximum heart rate and no symptoms nor EKG abnormalities to suggest myocardial ischemia or infarction.  Other findings as noted.  [place] Bing, M.D.

## 2012-01-15 ENCOUNTER — Other Ambulatory Visit: Payer: Self-pay

## 2012-01-15 DIAGNOSIS — E785 Hyperlipidemia, unspecified: Secondary | ICD-10-CM

## 2012-01-19 LAB — LIPID PANEL
Cholesterol: 121 mg/dL (ref 0–200)
Triglycerides: 73 mg/dL (ref <150)
VLDL: 15 mg/dL (ref 0–40)

## 2012-01-19 LAB — HEPATIC FUNCTION PANEL
ALT: 17 U/L (ref 0–53)
Alkaline Phosphatase: 70 U/L (ref 39–117)
Indirect Bilirubin: 0.4 mg/dL (ref 0.0–0.9)
Total Protein: 6.7 g/dL (ref 6.0–8.3)

## 2012-01-21 ENCOUNTER — Telehealth: Payer: Self-pay

## 2012-01-21 NOTE — Telephone Encounter (Signed)
**Note De-Identified Emmaus Brandi Obfuscation** LMOM. We need fax number to location that we are faxing all needed paperwork to./LV

## 2012-01-22 NOTE — Telephone Encounter (Signed)
**Note De-Identified Jeremy Lopez Obfuscation** Pt. did not return call. Obtained needed fax number from previous OV note. All requested paperwork faxed to [facility] with [facility] Certification Division at [phone]./LV

## 2012-01-24 ENCOUNTER — Telehealth: Payer: Self-pay

## 2012-01-24 NOTE — Telephone Encounter (Signed)
Per pt. request, recent lab results, last OV notes and GXT results faxed to [facility] at Dr. Charisse March office at [phone]./LV

## 2012-03-25 ENCOUNTER — Other Ambulatory Visit: Payer: Self-pay

## 2012-03-25 DIAGNOSIS — E785 Hyperlipidemia, unspecified: Secondary | ICD-10-CM

## 2012-04-11 ENCOUNTER — Encounter (HOSPITAL_COMMUNITY): Payer: BC Managed Care – PPO | Attending: Oncology

## 2012-04-11 DIAGNOSIS — J988 Other specified respiratory disorders: Secondary | ICD-10-CM | POA: Insufficient documentation

## 2012-04-11 DIAGNOSIS — Z452 Encounter for adjustment and management of vascular access device: Secondary | ICD-10-CM | POA: Insufficient documentation

## 2012-04-11 DIAGNOSIS — Z09 Encounter for follow-up examination after completed treatment for conditions other than malignant neoplasm: Secondary | ICD-10-CM | POA: Insufficient documentation

## 2012-04-11 DIAGNOSIS — C3410 Malignant neoplasm of upper lobe, unspecified bronchus or lung: Secondary | ICD-10-CM

## 2012-04-11 DIAGNOSIS — I10 Essential (primary) hypertension: Secondary | ICD-10-CM | POA: Insufficient documentation

## 2012-04-11 DIAGNOSIS — Z85118 Personal history of other malignant neoplasm of bronchus and lung: Secondary | ICD-10-CM | POA: Insufficient documentation

## 2012-04-11 DIAGNOSIS — I2510 Atherosclerotic heart disease of native coronary artery without angina pectoris: Secondary | ICD-10-CM | POA: Insufficient documentation

## 2012-04-11 LAB — DIFFERENTIAL
Basophils Absolute: 0 10*3/uL (ref 0.0–0.1)
Eosinophils Absolute: 0.2 10*3/uL (ref 0.0–0.7)
Eosinophils Relative: 2 % (ref 0–5)

## 2012-04-11 LAB — COMPREHENSIVE METABOLIC PANEL
ALT: 15 U/L (ref 0–53)
AST: 15 U/L (ref 0–37)
Calcium: 9.1 mg/dL (ref 8.4–10.5)
GFR calc Af Amer: 83 mL/min — ABNORMAL LOW (ref >90)
Sodium: 137 mEq/L (ref 135–145)
Total Protein: 6.7 g/dL (ref 6.0–8.3)

## 2012-04-11 LAB — CBC
MCH: 30.6 pg (ref 26.0–34.0)
MCV: 91.9 fL (ref 78.0–100.0)
Platelets: 258 10*3/uL (ref 150–400)
RDW: 13.9 % (ref 11.5–15.5)

## 2012-04-11 NOTE — Progress Notes (Signed)
Lab draw

## 2012-04-15 ENCOUNTER — Encounter (INDEPENDENT_AMBULATORY_CARE_PROVIDER_SITE_OTHER): Payer: Self-pay | Admitting: *Deleted

## 2012-04-15 ENCOUNTER — Encounter (HOSPITAL_BASED_OUTPATIENT_CLINIC_OR_DEPARTMENT_OTHER): Payer: BC Managed Care – PPO | Admitting: Oncology

## 2012-04-15 VITALS — BP 125/77 | HR 63 | Temp 96.7°F | Ht 70.0 in | Wt 217.0 lb

## 2012-04-15 DIAGNOSIS — I878 Other specified disorders of veins: Secondary | ICD-10-CM

## 2012-04-15 DIAGNOSIS — C3410 Malignant neoplasm of upper lobe, unspecified bronchus or lung: Secondary | ICD-10-CM

## 2012-04-15 DIAGNOSIS — I10 Essential (primary) hypertension: Secondary | ICD-10-CM

## 2012-04-15 MED ORDER — SODIUM CHLORIDE 0.9 % IJ SOLN
10.0000 mL | INTRAMUSCULAR | Status: DC | PRN
  Administered 2012-04-15 (×2): 10 mL via INTRAVENOUS
  Filled 2012-04-15: qty 10

## 2012-04-15 MED ORDER — HEPARIN SOD (PORK) LOCK FLUSH 100 UNIT/ML IV SOLN
INTRAVENOUS | Status: AC
  Filled 2012-04-15: qty 5

## 2012-04-15 MED ORDER — HEPARIN SOD (PORK) LOCK FLUSH 100 UNIT/ML IV SOLN
500.0000 [IU] | Freq: Once | INTRAVENOUS | Status: AC
  Administered 2012-04-15: 500 [IU] via INTRAVENOUS
  Filled 2012-04-15: qty 5

## 2012-04-15 MED ORDER — SODIUM CHLORIDE 0.9 % IJ SOLN
INTRAMUSCULAR | Status: AC
  Filled 2012-04-15: qty 20

## 2012-04-15 NOTE — Patient Instructions (Addendum)
Jeremy Lopez  [id number] [birth year] Dr. Glenford Peers   [facility]  Discharge Instructions  RECOMMENDATIONS MADE BY THE CONSULTANT AND ANY TEST RESULTS WILL BE SENT TO YOUR REFERRING DOCTOR.   EXAM FINDINGS BY MD TODAY AND SIGNS AND SYMPTOMS TO REPORT TO CLINIC OR PRIMARY MD: you are doing well.  You need to see Dr. Karilyn Cota for a colonoscopy.  Either we or Dr. Patty Sermons office will call you with an appointment.  MEDICATIONS PRESCRIBED: none   INSTRUCTIONS GIVEN AND DISCUSSED: Other :  Report shortness of breath, bone pain or productive cough, etc.  SPECIAL INSTRUCTIONS/FOLLOW-UP: See Schedule.   I acknowledge that I have been informed and understand all the instructions given to me and received a copy. I do not have any more questions at this time, but understand that I may call the Specialty Clinic at [facility] at [phone] during business hours should I have any further questions or need assistance in obtaining follow-up care.    __________________________________________  _____________  __________ Signature of Patient or Authorized Representative            Date                   Time    __________________________________________ Nurse's Signature

## 2012-04-15 NOTE — Progress Notes (Signed)
Jeremy Lopez presented for Portacath access and flush. Proper placement of portacath confirmed by CXR. Portacath located left chest wall accessed with  H 20 needle. Good blood return present. Portacath flushed with 20ml NS and 500U/16ml Heparin and needle removed intact. Procedure without incident. Patient tolerated procedure well.

## 2012-04-15 NOTE — Progress Notes (Signed)
Problem #1 small cell lung cancer limited stage disease diagnosed in August 2008 status post 6 cycles of chemotherapy with cis-platinum VP-16 and CCNU alternating with carboplatin and VP-16 and Adriamycin with involved field radiation to the chest with cycles #5 and #6. He then had prophylactic cranial irradiation with his last chemotherapy on 10/17/2007 and he remains in complete remission.  Problem #2 CAD status post cardiac catheterization with stent placed in September 2010.  Problem #3 mild airway obstructive disease but with excellent PFTs according to Dr. Juanetta Gosling in the past  Problem #4 hypertension  The patient is doing very well and is about to return to work full time in August. He has no shortness of breath no chest pain no lumps anywhere no headaches no seizures no loss of appetite bowel function is fine no GU complaints. He still needs colonoscopy and we will refer him to Dr. Karilyn Cota for that. He has blood work pending from today. Physical exam vital signs are stable but his weight is still slightly excessive for his height. He weighs 217 pounds a 5 foot 10 inches frame. He has no lymphadenopathy specifically in the cervical supraclavicular infraclavicular axillary or inguinal areas. He has no arm or leg edema. His Port-A-Cath is intact in the left upper chest wall. His lungs are clear to auscultation and percussion. His heart shows a regular rhythm and rate without murmur rub or gallop. Abdomen is soft nontender without hepatosplenomegaly that I can detect. Bowel sounds are normal facial symmetry is intact he is very alert and very oriented. He looks great we will do CT scans in October an MRI in January of the brain and I'll see him back after the MRI the brain. 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

## 2012-05-27 ENCOUNTER — Encounter (HOSPITAL_COMMUNITY): Payer: BC Managed Care – PPO

## 2012-06-11 ENCOUNTER — Other Ambulatory Visit: Payer: Self-pay | Admitting: Adult Health

## 2012-07-17 ENCOUNTER — Encounter (HOSPITAL_COMMUNITY): Payer: BC Managed Care – PPO | Attending: Oncology

## 2012-07-17 DIAGNOSIS — C3410 Malignant neoplasm of upper lobe, unspecified bronchus or lung: Secondary | ICD-10-CM | POA: Insufficient documentation

## 2012-07-17 LAB — BASIC METABOLIC PANEL
GFR calc Af Amer: 85 mL/min — ABNORMAL LOW (ref >90)
GFR calc non Af Amer: 73 mL/min — ABNORMAL LOW (ref >90)
Potassium: 4.3 mEq/L (ref 3.5–5.1)
Sodium: 137 mEq/L (ref 135–145)

## 2012-07-17 NOTE — Progress Notes (Signed)
Labs drawn today for bmp 

## 2012-07-18 ENCOUNTER — Other Ambulatory Visit (HOSPITAL_COMMUNITY): Payer: BC Managed Care – PPO

## 2012-07-21 ENCOUNTER — Other Ambulatory Visit (HOSPITAL_COMMUNITY): Payer: BC Managed Care – PPO

## 2012-07-21 ENCOUNTER — Ambulatory Visit (HOSPITAL_COMMUNITY)
Admission: RE | Admit: 2012-07-21 | Discharge: 2012-07-21 | Disposition: A | Discharge status: Home / Self Care | Payer: BC Managed Care – PPO | Source: Ambulatory Visit | Attending: Oncology | Admitting: Oncology

## 2012-07-21 DIAGNOSIS — C3490 Malignant neoplasm of unspecified part of unspecified bronchus or lung: Secondary | ICD-10-CM | POA: Insufficient documentation

## 2012-07-21 DIAGNOSIS — K4020 Bilateral inguinal hernia, without obstruction or gangrene, not specified as recurrent: Secondary | ICD-10-CM | POA: Insufficient documentation

## 2012-07-21 DIAGNOSIS — C3410 Malignant neoplasm of upper lobe, unspecified bronchus or lung: Secondary | ICD-10-CM

## 2012-07-21 DIAGNOSIS — R9389 Abnormal findings on diagnostic imaging of other specified body structures: Secondary | ICD-10-CM | POA: Insufficient documentation

## 2012-07-21 MED ORDER — IOHEXOL 300 MG/ML  SOLN
100.0000 mL | Freq: Once | INTRAMUSCULAR | Status: AC | PRN
  Administered 2012-07-21: 100 mL via INTRAVENOUS

## 2012-08-02 ENCOUNTER — Other Ambulatory Visit: Payer: Self-pay | Admitting: Cardiology

## 2012-10-15 ENCOUNTER — Other Ambulatory Visit: Payer: Self-pay | Admitting: Cardiology

## 2012-10-17 ENCOUNTER — Other Ambulatory Visit (HOSPITAL_COMMUNITY): Payer: BC Managed Care – PPO

## 2012-10-20 ENCOUNTER — Ambulatory Visit (HOSPITAL_COMMUNITY): Admission: RE | Admit: 2012-10-20 | Payer: BC Managed Care – PPO | Source: Ambulatory Visit

## 2012-10-22 ENCOUNTER — Ambulatory Visit (HOSPITAL_COMMUNITY): Payer: BC Managed Care – PPO | Admitting: Oncology

## 2012-10-29 ENCOUNTER — Encounter (HOSPITAL_COMMUNITY): Payer: Self-pay

## 2013-01-06 ENCOUNTER — Other Ambulatory Visit (HOSPITAL_COMMUNITY): Payer: Self-pay | Admitting: Oncology

## 2013-01-19 ENCOUNTER — Ambulatory Visit (HOSPITAL_COMMUNITY)
Admission: RE | Admit: 2013-01-19 | Discharge: 2013-01-19 | Disposition: A | Discharge status: Home / Self Care | Payer: BC Managed Care – PPO | Source: Ambulatory Visit | Attending: Oncology | Admitting: Oncology

## 2013-01-19 ENCOUNTER — Other Ambulatory Visit (HOSPITAL_COMMUNITY): Payer: Self-pay

## 2013-01-19 DIAGNOSIS — I10 Essential (primary) hypertension: Secondary | ICD-10-CM | POA: Insufficient documentation

## 2013-01-19 DIAGNOSIS — C3490 Malignant neoplasm of unspecified part of unspecified bronchus or lung: Secondary | ICD-10-CM | POA: Insufficient documentation

## 2013-01-19 LAB — POCT I-STAT, CHEM 8
BUN: 14 mg/dL (ref 6–23)
Calcium, Ion: 1.13 mmol/L (ref 1.13–1.30)
Creatinine, Ser: 1.1 mg/dL (ref 0.50–1.35)
Glucose, Bld: 95 mg/dL (ref 70–99)
Hemoglobin: 14.6 g/dL (ref 13.0–17.0)
TCO2: 25 mmol/L (ref 0–100)

## 2013-01-19 MED ORDER — GADOBENATE DIMEGLUMINE 529 MG/ML IV SOLN
20.0000 mL | Freq: Once | INTRAVENOUS | Status: AC | PRN
  Administered 2013-01-19: 20 mL via INTRAVENOUS

## 2013-01-19 NOTE — Progress Notes (Signed)
Blood sample obtained from left arm IV for Creatnine level.  

## 2013-01-20 ENCOUNTER — Encounter (HOSPITAL_COMMUNITY): Payer: BC Managed Care – PPO | Attending: Oncology

## 2013-01-20 DIAGNOSIS — J4489 Other specified chronic obstructive pulmonary disease: Secondary | ICD-10-CM | POA: Insufficient documentation

## 2013-01-20 DIAGNOSIS — I10 Essential (primary) hypertension: Secondary | ICD-10-CM | POA: Insufficient documentation

## 2013-01-20 DIAGNOSIS — J449 Chronic obstructive pulmonary disease, unspecified: Secondary | ICD-10-CM | POA: Insufficient documentation

## 2013-01-20 DIAGNOSIS — Z85118 Personal history of other malignant neoplasm of bronchus and lung: Secondary | ICD-10-CM | POA: Insufficient documentation

## 2013-01-20 DIAGNOSIS — I2510 Atherosclerotic heart disease of native coronary artery without angina pectoris: Secondary | ICD-10-CM | POA: Insufficient documentation

## 2013-01-20 DIAGNOSIS — Z09 Encounter for follow-up examination after completed treatment for conditions other than malignant neoplasm: Secondary | ICD-10-CM | POA: Insufficient documentation

## 2013-01-20 DIAGNOSIS — C3410 Malignant neoplasm of upper lobe, unspecified bronchus or lung: Secondary | ICD-10-CM

## 2013-01-20 LAB — COMPREHENSIVE METABOLIC PANEL
Albumin: 4.2 g/dL (ref 3.5–5.2)
Alkaline Phosphatase: 81 U/L (ref 39–117)
BUN: 16 mg/dL (ref 6–23)
Calcium: 9.3 mg/dL (ref 8.4–10.5)
Creatinine, Ser: 1.02 mg/dL (ref 0.50–1.35)
GFR calc Af Amer: 90 mL/min — ABNORMAL LOW (ref >90)
Glucose, Bld: 97 mg/dL (ref 70–99)
Potassium: 4.1 mEq/L (ref 3.5–5.1)
Total Protein: 7.8 g/dL (ref 6.0–8.3)

## 2013-01-20 LAB — TSH: TSH: 3.233 u[IU]/mL (ref 0.350–4.500)

## 2013-01-20 NOTE — Progress Notes (Signed)
Labs drawn today for cmp,tsh

## 2013-01-21 ENCOUNTER — Ambulatory Visit (HOSPITAL_COMMUNITY): Payer: BC Managed Care – PPO | Admitting: Oncology

## 2013-01-22 ENCOUNTER — Ambulatory Visit: Payer: BC Managed Care – PPO | Admitting: Adult Health

## 2013-01-26 ENCOUNTER — Encounter: Payer: Self-pay | Admitting: Adult Health

## 2013-01-26 ENCOUNTER — Encounter (HOSPITAL_BASED_OUTPATIENT_CLINIC_OR_DEPARTMENT_OTHER): Payer: BC Managed Care – PPO | Admitting: Oncology

## 2013-01-26 ENCOUNTER — Ambulatory Visit (INDEPENDENT_AMBULATORY_CARE_PROVIDER_SITE_OTHER): Payer: BC Managed Care – PPO | Admitting: Adult Health

## 2013-01-26 VITALS — BP 122/86 | HR 74 | Ht 70.0 in | Wt 222.0 lb

## 2013-01-26 DIAGNOSIS — J449 Chronic obstructive pulmonary disease, unspecified: Secondary | ICD-10-CM

## 2013-01-26 DIAGNOSIS — I2510 Atherosclerotic heart disease of native coronary artery without angina pectoris: Secondary | ICD-10-CM

## 2013-01-26 DIAGNOSIS — C3410 Malignant neoplasm of upper lobe, unspecified bronchus or lung: Secondary | ICD-10-CM

## 2013-01-26 DIAGNOSIS — I10 Essential (primary) hypertension: Secondary | ICD-10-CM

## 2013-01-26 DIAGNOSIS — E785 Hyperlipidemia, unspecified: Secondary | ICD-10-CM

## 2013-01-26 DIAGNOSIS — L989 Disorder of the skin and subcutaneous tissue, unspecified: Secondary | ICD-10-CM

## 2013-01-26 NOTE — Progress Notes (Deleted)
Name: Jeremy Lopez    DOB: October 15, 1950  Age: 61 y.o.  MR#: [id number]       PCP:  [name], MD      Insurance: Payor: BLUE CROSS BLUE [employer]IELD  Plan: BCBS PPO OUT OF STATE  Product Type: *No Product type*    CC:    Chief Complaint  Patient presents with  . Coronary Artery Disease  . Hypertension    VS Filed Vitals:   01/26/13 1449  BP: 122/86  Pulse: 74  Height: 5\' 10"  (1.778 m)  Weight: 222 lb (100.699 kg)  SpO2: 97%    Weights Current Weight  01/26/13 222 lb (100.699 kg)  01/26/13 223 lb (101.152 kg)  04/15/12 217 lb (98.431 kg)    Blood Pressure  BP Readings from Last 3 Encounters:  01/26/13 122/86  01/26/13 142/85  04/15/12 125/77     Admit date:  (Not on file) Last encounter with RMR:  01/22/2013   Allergy Cephalexin  Current Outpatient Prescriptions  Medication Sig Dispense Refill  . aspirin 81 MG tablet Take 81 mg by mouth daily.       . CRESTOR 40 MG tablet TAKE 1 TABLET BY MOUTH EVERY DAY  90 tablet  3  . diltiazem (CARDIZEM CD) 180 MG 24 hr capsule TAKE ONE CAPSULE BY MOUTH DAILY  90 capsule  3  . metoprolol succinate (TOPROL-XL) 50 MG 24 hr tablet TAKE 1 TABLET EVERY DAY  90 tablet  2   No current facility-administered medications for this visit.    Discontinued Meds:   There are no discontinued medications.  Patient Active Problem List  Diagnosis  . CARCINOMA, RIGHT LUNG, SMALL CELL, UPPER LOBE  . HYPERLIPIDEMIA  . HYPERTENSION  . ATHEROSCLEROTIC CARDIOVASCULAR DISEASE    LABS    Component Value Date/Time   NA 137 01/20/2013 1114   NA 138 01/19/2013 1100   NA 137 07/17/2012 1054   K 4.1 01/20/2013 1114   K 4.2 01/19/2013 1100   K 4.3 07/17/2012 1054   CL 100 01/20/2013 1114   CL 107 01/19/2013 1100   CL 101 07/17/2012 1054   CO2 27 01/20/2013 1114   CO2 26 07/17/2012 1054   CO2 26 04/11/2012 0918   GLUCOSE 97 01/20/2013 1114   GLUCOSE 95 01/19/2013 1100   GLUCOSE 90 07/17/2012 1054   BUN 16 01/20/2013 1114   BUN 14 01/19/2013 1100   BUN 19  07/17/2012 1054   CREATININE 1.02 01/20/2013 1114   CREATININE 1.10 01/19/2013 1100   CREATININE 1.07 07/17/2012 1054   CALCIUM 9.3 01/20/2013 1114   CALCIUM 9.5 07/17/2012 1054   CALCIUM 9.1 04/11/2012 0918   GFRNONAA 77* 01/20/2013 1114   GFRNONAA 73* 07/17/2012 1054   GFRNONAA 72* 04/11/2012 0918   GFRAA 90* 01/20/2013 1114   GFRAA 85* 07/17/2012 1054   GFRAA 83* 04/11/2012 0918   CMP     Component Value Date/Time   NA 137 01/20/2013 1114   K 4.1 01/20/2013 1114   CL 100 01/20/2013 1114   CO2 27 01/20/2013 1114   GLUCOSE 97 01/20/2013 1114   BUN 16 01/20/2013 1114   CREATININE 1.02 01/20/2013 1114   CALCIUM 9.3 01/20/2013 1114   PROT 7.8 01/20/2013 1114   ALBUMIN 4.2 01/20/2013 1114   AST 14 01/20/2013 1114   ALT 14 01/20/2013 1114   ALKPHOS 81 01/20/2013 1114   BILITOT 0.5 01/20/2013 1114   GFRNONAA 77* 01/20/2013 1114   GFRAA 90* 01/20/2013 1114  Component Value Date/Time   WBC 7.3 04/11/2012 0918   WBC 6.1 07/30/2011 1001   WBC 6.9 07/21/2010 1800   WBC 11.6* 10/14/2007 1541   WBC 7.7 10/07/2007 1537   WBC 6.9 09/30/2007 1544   HGB 14.6 01/19/2013 1100   HGB 14.0 04/11/2012 0918   HGB 15.0 10/30/2011 1025   HGB 11.8* 10/14/2007 1541   HGB 11.8* 10/07/2007 1537   HGB 10.1* 09/30/2007 1544   HCT 43.0 01/19/2013 1100   HCT 42.1 04/11/2012 0918   HCT 44.0 10/30/2011 1025   HCT 35.2* 10/14/2007 1541   HCT 35.1* 10/07/2007 1537   HCT 29.1* 09/30/2007 1544   MCV 91.9 04/11/2012 0918   MCV 93.0 07/30/2011 1001   MCV 99.1 07/21/2010 1800   MCV 96.4 10/14/2007 1541   MCV 96.4 10/07/2007 1537   MCV 93.0 09/30/2007 1544    Lipid Panel     Component Value Date/Time   CHOL 121 01/18/2012 1205   TRIG 73 01/18/2012 1205   HDL 53 01/18/2012 1205   CHOLHDL 2.3 01/18/2012 1205   VLDL 15 01/18/2012 1205   LDLCALC 53 01/18/2012 1205    ABG    Component Value Date/Time   PHART 7.411 09/22/2010 0910   PCO2ART 36.8 09/22/2010 0910   PO2ART 96.7 09/22/2010 0910   HCO3 22.9 09/22/2010 0910   TCO2 25  01/19/2013 1100   ACIDBASEDEF 1.0 09/22/2010 0910   O2SAT 97.6 09/22/2010 0910     Lab Results  Component Value Date   TSH 3.233 01/20/2013   BNP (last 3 results) No results found for this basename: PROBNP,  in the last 8760 hours Cardiac Panel (last 3 results) No results found for this basename: CKTOTAL, CKMB, TROPONINI, RELINDX,  in the last 72 hours  Iron/TIBC/Ferritin No results found for this basename: iron, tibc, ferritin     EKG Orders placed in visit on 01/26/13  . EKG 12-LEAD     Prior Assessment and Plan Problem List as of 01/26/2013     ICD-9-CM   CARCINOMA, RIGHT LUNG, SMALL CELL, UPPER LOBE   HYPERLIPIDEMIA   Last Assessment & Plan   08/13/2011 Office Visit Written 08/13/2011 12:31 PM by Jodelle Gross, NP     He is due for lipids and LFTs next week for ongoing assessment.  Will follow    HYPERTENSION   Last Assessment & Plan   11/16/2011 Office Visit Written 11/16/2011  3:46 PM by Jodelle Gross, NP     Currently well controlled. No changes in his medical regimen.    ATHEROSCLEROTIC CARDIOVASCULAR DISEASE   Last Assessment & Plan   11/16/2011 Office Visit Edited 11/16/2011  3:46 PM by Jodelle Gross, NP     He has had multiple stress tests and a recent cardiac cath, but requires another stress test to qualify for his license. Review of the letter from FAA is dated 12/06/2010, however it states that he will need stress test "on or about Sept 2013" Most recent stress test was completed 02/2010. Negative for ischemia,  Cath in 2011 demonstrated widely patent LAD with mild diffuse in-stent restenosis. Will plan a GXT for evaluation with copies to the FAA once completed.  I have spoken to [facility] with the [facility] Certification Division, [phone] who confirms that subsequent testing has to be completed. Sept 2012 a GXT should have been completed, Sept 2013 a myoview has to be completed in order to have his license renewed. He did not have the stress test  completed for Sept 2012 and his license was suspended in November of 2012. We will send the stress test results directly to the FAA at a FAX number [phone] once GXT is completed. Will keep a copy of the FAA letter for our records.        Imaging: Mr Laqueta Jean UJ Contrast  01/19/2013  *RADIOLOGY REPORT*  Clinical Data: Lung cancer.  Evaluate for possibility of intracranial metastatic disease.  Hypertension and hyperlipidemia.  MRI HEAD WITHOUT AND WITH CONTRAST  Technique:  Multiplanar, multiecho pulse sequences of the brain and surrounding structures were obtained according to standard protocol without and with intravenous contrast  Contrast: 20mL MULTIHANCE GADOBENATE DIMEGLUMINE 529 MG/ML IV SOLN  Comparison: 10/30/2011.  Findings: No intracranial enhancing lesion or bony destructive lesion to suggest presence of intracranial metastatic disease.  No acute infarct.  No intracranial hemorrhage.  Mild exophthalmos.  Major intracranial vascular structures are patent.  Global atrophy.  Ventricular prominence unchanged and probably related to atrophy rather than hydrocephalus.  Minimal to mild ethmoid sinus/maxillary sinus mucosal thickening.  IMPRESSION: No evidence of intracranial hemorrhage.   Original Report Authenticated By: Lacy Duverney, M.D.

## 2013-01-26 NOTE — Assessment & Plan Note (Signed)
He is doing well currently without any cardiac complaints. We will schedule him for a GXT per FAA guidelines. This will be completed next week along with fasting lipids and LFTs, and a BMET. The patient is requesting a full package and interpretation of test to be sent to the [facility] after completion. The patient's represent number PI #[id number] M. ID [id number], APP-ID number [id number]. These numbers should be placed on report.

## 2013-01-26 NOTE — Assessment & Plan Note (Signed)
Cholesterol status will be evaluated by labs next week. Copies of labs will be sent to his FAA authorization board.

## 2013-01-26 NOTE — Progress Notes (Signed)
   HPI: Jeremy Lopez is a 62 year old patient of Dr. 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 He was returning to flying status in 2013. Patient had a stress test completed in September of 2013, which was found be normal with excellent exercise tolerance. Information is usually sent to [facility] with the aerospace medical certification division [phone]) all cardiac records are usually faxed to [phone]). He was last seen in the office on 12/06/2011, and was without complaint.   A feeling well, is continuing to fly, has had no hospitalizations or ER visits. He is requesting his annual stress test and labs to be submitted to the FAA. He denied any symptoms.  Allergies  Allergen Reactions  . Cephalexin     REACTION: throat swelling    Current Outpatient Prescriptions  Medication Sig Dispense Refill  . aspirin 81 MG tablet Take 81 mg by mouth daily.       . CRESTOR 40 MG tablet TAKE 1 TABLET BY MOUTH EVERY DAY  90 tablet  3  . diltiazem (CARDIZEM CD) 180 MG 24 hr capsule TAKE ONE CAPSULE BY MOUTH DAILY  90 capsule  3  . metoprolol succinate (TOPROL-XL) 50 MG 24 hr tablet TAKE 1 TABLET EVERY DAY  90 tablet  2   No current facility-administered medications for this visit.    Past Medical History  Diagnosis Date  . ASCVD (arteriosclerotic cardiovascular disease)      DES placed for 95% proximal LAD stenosis in 07/2009; scattered 20-40% lesions; normal EF  . Carcinoma     RIGHT UPPER LOBE; SMALL CELL-2008; no evidence of disease since treatment ended in 11/2007  . Hypertension   . Hyperlipidemia     Past Surgical History  Procedure Laterality Date  . Cardiac catheterization   06/30/09     (endeaver 2.79mm x 18mm drug eluting stent)  . Right upper lobe small cell carcinoma       GNF:AOZHYQ of systems complete and found to be negative unless listed above  PHYSICAL EXAM BP 122/86  Pulse 74  Ht 5\' 10"  (1.778 m)  Wt 222 lb (100.699 kg)  BMI 31.85 kg/m2  SpO2 97%  General: Well developed, well nourished, in no acute distress Head: Eyes PERRLA, No xanthomas.   Normal cephalic and atramatic  Lungs: Clear bilaterally to auscultation and percussion. Heart: HRRR S1 S2, without MRG.  Pulses are 2+ & equal.            No carotid bruit. No JVD.  No abdominal bruits. No femoral bruits. Abdomen: Bowel sounds are positive, abdomen soft and non-tender without masses or                  Hernia's noted. Msk:  Back normal, normal gait. Normal strength and tone for age. Extremities: No clubbing, cyanosis or edema.  DP +1 Neuro: Alert and oriented X 3. Psych:  Good affect, responds appropriately  ASSESSMENT AND PLAN

## 2013-01-26 NOTE — Assessment & Plan Note (Signed)
Excellent control of blood pressure at time of this visit. We will continue him on his current medication regimen without changes.

## 2013-01-26 NOTE — Progress Notes (Signed)
Problem #1 small cell lung cancer, limited stage disease, diagnosed in August 2008 status post 6 cycles of chemotherapy with cis-platinum, VP-16, and CCNU alternating with carboplatinum and VP-16 and Adriamycin. She also have involved field radiation to the chest during cycles #5 and #6. He then had prophylactic cranial irradiation in his last chemotherapy was on 10/17/2007. Thus far he remains in complete remission without evidence for disease.  #2 CAD status post cardiac catheterization with stent placed in September 2000 and #3 mild COPD #4 history of hypertension  He remains asymptomatic from an oncology standpoint. He is still working as an Buyer, retail. His weight is up 5 pounds in the last 9 months. His appetite remains excellent. He has no neurological symptoms either.  He had CAT scans in October 2013 of the chest, abdomen, and pelvis. He had no evidence for recurrent disease. He has no new lung lesions.  He just had an MRI of his brain the other day which also showed no evidence for disease. His liver enzymes the other day including kidney function, hemoglobin and hematocrit were also within normal range.  His vital signs are stable other than his weight which is up 5 pounds. He has clear lung fields. He has no lymphadenopathy in the cervical, supraclavicular, infraclavicular, axillary or inguinal areas. He has no arm or leg edema. No thyromegaly. Facial symmetry is intact. He is alert and oriented. Heart shows a regular rhythm and rate without murmur rub or gallop. Abdomen remains soft, slightly obese, without organomegaly or masses. There is no obvious ascites. Bowel sounds are normal. He does have a small 8 mm lesion posterior right scalp at the border between his alopecia and his hairline which is suspicious for possible squamous cell carcinoma versus actinic keratosis. I have asked him to make an appointment with Dr. Nita Sells in dermatology.  I will see him otherwise in October after  CAT scans of the chest abdomen and pelvis once again. We will then probably need to set up an MRI the brain one year from the other day.

## 2013-01-26 NOTE — Patient Instructions (Addendum)
Your physician recommends that you schedule a follow-up appointment in: 2 weeks with [facility]  Your physician has requested that you have an exercise tolerance test. For further information please visit [email or web address] Please also follow instruction sheet, as given.  Your physician recommends that you return for lab work in: WITHIN 2 WEEKS (SAME DAY AS GXT) SLIPS GIVEN FOR L/L AND BMET

## 2013-02-02 ENCOUNTER — Ambulatory Visit (HOSPITAL_COMMUNITY)
Admission: RE | Admit: 2013-02-02 | Discharge: 2013-02-02 | Disposition: A | Discharge status: Home / Self Care | Payer: BC Managed Care – PPO | Source: Ambulatory Visit | Attending: Adult Health | Admitting: Adult Health

## 2013-02-02 DIAGNOSIS — I10 Essential (primary) hypertension: Secondary | ICD-10-CM

## 2013-02-02 DIAGNOSIS — I2510 Atherosclerotic heart disease of native coronary artery without angina pectoris: Secondary | ICD-10-CM

## 2013-02-02 LAB — HEPATIC FUNCTION PANEL
ALT: 13 U/L (ref 0–53)
AST: 11 U/L (ref 0–37)
Albumin: 4.4 g/dL (ref 3.5–5.2)
Alkaline Phosphatase: 64 U/L (ref 39–117)
Total Bilirubin: 0.7 mg/dL (ref 0.3–1.2)

## 2013-02-02 LAB — BASIC METABOLIC PANEL
CO2: 26 mEq/L (ref 19–32)
Calcium: 9.3 mg/dL (ref 8.4–10.5)
Creat: 1.09 mg/dL (ref 0.50–1.35)
Glucose, Bld: 92 mg/dL (ref 70–99)

## 2013-02-02 LAB — LIPID PANEL
Cholesterol: 113 mg/dL (ref 0–200)
HDL: 45 mg/dL (ref >39)

## 2013-02-06 ENCOUNTER — Other Ambulatory Visit: Payer: Self-pay | Admitting: Cardiology

## 2013-02-06 DIAGNOSIS — R079 Chest pain, unspecified: Secondary | ICD-10-CM

## 2013-02-09 ENCOUNTER — Encounter (HOSPITAL_COMMUNITY)
Admission: RE | Admit: 2013-02-09 | Discharge: 2013-02-09 | Disposition: A | Discharge status: Home / Self Care | Payer: BC Managed Care – PPO | Source: Ambulatory Visit | Attending: Cardiology | Admitting: Cardiology

## 2013-02-09 ENCOUNTER — Ambulatory Visit (HOSPITAL_COMMUNITY)
Admission: RE | Admit: 2013-02-09 | Discharge: 2013-02-09 | Disposition: A | Discharge status: Home / Self Care | Payer: BC Managed Care – PPO | Source: Ambulatory Visit | Attending: Cardiology | Admitting: Cardiology

## 2013-02-09 ENCOUNTER — Encounter (HOSPITAL_COMMUNITY): Payer: Self-pay

## 2013-02-09 ENCOUNTER — Encounter: Payer: Self-pay | Admitting: Cardiology

## 2013-02-09 ENCOUNTER — Encounter (HOSPITAL_COMMUNITY): Payer: Self-pay | Admitting: Cardiology

## 2013-02-09 DIAGNOSIS — I2510 Atherosclerotic heart disease of native coronary artery without angina pectoris: Secondary | ICD-10-CM

## 2013-02-09 DIAGNOSIS — R079 Chest pain, unspecified: Secondary | ICD-10-CM | POA: Insufficient documentation

## 2013-02-09 MED ORDER — SODIUM CHLORIDE 0.9 % IJ SOLN
INTRAMUSCULAR | Status: AC
  Administered 2013-02-09: 10 mL via INTRAVENOUS
  Filled 2013-02-09: qty 10

## 2013-02-09 MED ORDER — TECHNETIUM TC 99M SESTAMIBI - CARDIOLITE
10.0000 | Freq: Once | INTRAVENOUS | Status: AC | PRN
  Administered 2013-02-09: 09:00:00 10 via INTRAVENOUS

## 2013-02-09 MED ORDER — TECHNETIUM TC 99M SESTAMIBI - CARDIOLITE
30.0000 | Freq: Once | INTRAVENOUS | Status: AC | PRN
  Administered 2013-02-09: 11:00:00 30 via INTRAVENOUS

## 2013-02-09 NOTE — Progress Notes (Signed)
Stress Lab Nurses Notes - Robben Jagiello 02/09/2013 Reason for doing test: CAD Type of test: Stress Cardioite Nurse performing test: Parke Poisson, RN Nuclear Medicine Tech: Lyndel Pleasure Echo Tech: Not Applicable MD performing test: Ival Bible & Joni Reining NP Family MD: Ouida Sills Test explained and consent signed: yes IV started: 22g jelco, Saline lock flushed, No redness or edema and Saline lock started in radiology Symptoms: Fatigue Treatment/Intervention: None Reason test stopped: fatigue After recovery IV was: Discontinued via X-ray tech and No redness or edema Patient to return to Nuc. Med at : 11:30 Patient discharged: Home Patient's Condition upon discharge was: stable Comments: During test peak BP 207/88 & HR 162.  Recovery BP 117/76 & HR 98.  Symptoms resolved in recovery. [name]

## 2013-03-17 ENCOUNTER — Other Ambulatory Visit: Payer: Self-pay | Admitting: Cardiology

## 2013-03-17 NOTE — Telephone Encounter (Signed)
Incoming refill request. Noted patient over due for follow up. Sent 30 supply with notation to pharmacist as follows; no refills to allow time to call and set up appointment.

## 2013-05-17 ENCOUNTER — Other Ambulatory Visit: Payer: Self-pay | Admitting: Adult Health

## 2013-07-13 ENCOUNTER — Telehealth: Payer: Self-pay

## 2013-07-13 MED ORDER — ROSUVASTATIN CALCIUM 40 MG PO TABS: 40.0000 mg | ORAL_TABLET | Freq: Every day | ORAL | Status: DC

## 2013-07-13 NOTE — Telephone Encounter (Signed)
Patient states he is going out of town and needs to have Crestor refilled.  Patient states he requested Authorization on Thursday to [facility] on [address].  Please call patient to advise status of this request.  Call back [phone]

## 2013-07-13 NOTE — Telephone Encounter (Signed)
rx sent to pharmacy by e-script Called pt to advise he needs a f/u apt per overdue, pt was DR RR pt, scheduled for 08-10-13 with Dr Wyline Mood, pt accepted apt

## 2013-07-20 ENCOUNTER — Encounter (HOSPITAL_COMMUNITY): Payer: BC Managed Care – PPO | Attending: Hematology and Oncology

## 2013-07-20 DIAGNOSIS — C3490 Malignant neoplasm of unspecified part of unspecified bronchus or lung: Secondary | ICD-10-CM | POA: Insufficient documentation

## 2013-07-20 DIAGNOSIS — C3410 Malignant neoplasm of upper lobe, unspecified bronchus or lung: Secondary | ICD-10-CM

## 2013-07-20 LAB — COMPREHENSIVE METABOLIC PANEL
Albumin: 4.2 g/dL (ref 3.5–5.2)
BUN: 16 mg/dL (ref 6–23)
Creatinine, Ser: 1.18 mg/dL (ref 0.50–1.35)
Total Protein: 7.4 g/dL (ref 6.0–8.3)

## 2013-07-20 LAB — CBC WITH DIFFERENTIAL/PLATELET
Eosinophils Relative: 2 % (ref 0–5)
HCT: 43.5 % (ref 39.0–52.0)
Hemoglobin: 14.7 g/dL (ref 13.0–17.0)
Lymphocytes Relative: 16 % (ref 12–46)
Lymphs Abs: 1.2 10*3/uL (ref 0.7–4.0)
MCV: 91.2 fL (ref 78.0–100.0)
Monocytes Absolute: 0.6 10*3/uL (ref 0.1–1.0)
Monocytes Relative: 8 % (ref 3–12)
RBC: 4.77 MIL/uL (ref 4.22–5.81)
WBC: 7.4 10*3/uL (ref 4.0–10.5)

## 2013-07-20 LAB — LACTATE DEHYDROGENASE: LDH: 166 U/L (ref 94–250)

## 2013-07-20 NOTE — Progress Notes (Signed)
Labs drawn today for cbc/diff,cmp,ldh 

## 2013-07-28 ENCOUNTER — Ambulatory Visit (HOSPITAL_COMMUNITY): Payer: BC Managed Care – PPO

## 2013-07-28 ENCOUNTER — Ambulatory Visit (HOSPITAL_COMMUNITY)
Admission: RE | Admit: 2013-07-28 | Discharge: 2013-07-28 | Disposition: A | Discharge status: Home / Self Care | Payer: BC Managed Care – PPO | Source: Ambulatory Visit | Attending: Oncology | Admitting: Oncology

## 2013-07-28 DIAGNOSIS — C3410 Malignant neoplasm of upper lobe, unspecified bronchus or lung: Secondary | ICD-10-CM | POA: Insufficient documentation

## 2013-07-28 MED ORDER — IOHEXOL 300 MG/ML  SOLN
100.0000 mL | Freq: Once | INTRAMUSCULAR | Status: AC | PRN
  Administered 2013-07-28: 100 mL via INTRAVENOUS

## 2013-07-31 ENCOUNTER — Ambulatory Visit (HOSPITAL_COMMUNITY): Payer: BC Managed Care – PPO

## 2013-07-31 ENCOUNTER — Encounter (HOSPITAL_COMMUNITY): Payer: Self-pay

## 2013-08-01 NOTE — Progress Notes (Signed)
This encounter was created in error - please disregard.

## 2013-08-10 ENCOUNTER — Encounter: Payer: Self-pay | Admitting: *Deleted

## 2013-08-10 ENCOUNTER — Encounter: Payer: BC Managed Care – PPO | Admitting: Cardiology

## 2013-08-10 NOTE — Progress Notes (Signed)
Clinical Summary Jeremy Lopez is a 62 y.o.male  1. CAD - prior DES to LAD in 2010, repeat cath 2011 showed overall patent stent and coronaries. 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  - 02/2013 stress MPI: excecised 9 minutes, 10 METs, 85% of THR, no chest pain, no ischemic EKG changes, no ischemia on imaging, LVEF 67%  2. HTN   3. Hyperlipidemia  4. Small cell lung CA - diagnosed 2008, s/p 6 cycles of chemotherapy - followed by oncology   Past Medical History  Diagnosis Date  . ASCVD (arteriosclerotic cardiovascular disease)      DES placed for 95% proximal LAD stenosis in 07/2009; scattered 20-40% lesions; normal EF  . Carcinoma     RIGHT UPPER LOBE; SMALL CELL-2008; no evidence of disease since treatment ended in 11/2007  . Hypertension   . Hyperlipidemia      Allergies  Allergen Reactions  . Cephalexin     REACTION: throat swelling     Current Outpatient Prescriptions  Medication Sig Dispense Refill  . aspirin 81 MG tablet Take 81 mg by mouth daily.       Marland Kitchen diltiazem (CARDIZEM CD) 180 MG 24 hr capsule TAKE ONE CAPSULE BY MOUTH DAILY  90 capsule  3  . metoprolol succinate (TOPROL-XL) 50 MG 24 hr tablet TAKE 1 TABLET EVERY DAY  30 tablet  3  . rosuvastatin (CRESTOR) 40 MG tablet Take 1 tablet (40 mg total) by mouth daily.  90 tablet  1   No current facility-administered medications for this visit.     Past Surgical History  Procedure Laterality Date  . Cardiac catheterization   06/30/09     (endeaver 2.53mm x 18mm drug eluting stent)  . Right upper lobe small cell carcinoma       Allergies  Allergen Reactions  . Cephalexin     REACTION: throat swelling      No family history on file.   Social History Jeremy Lopez reports that he has quit smoking. He has never used smokeless tobacco. Jeremy Lopez reports that he drinks  alcohol.   Review of Systems CONSTITUTIONAL: No weight loss, fever, chills, weakness or fatigue.  HEENT: Eyes: No visual loss, blurred vision, double vision or yellow sclerae.No hearing loss, sneezing, congestion, runny nose or sore throat.  SKIN: No rash or itching.  CARDIOVASCULAR:  RESPIRATORY: No shortness of breath, cough or sputum.  GASTROINTESTINAL: No anorexia, nausea, vomiting or diarrhea. No abdominal pain or blood.  GENITOURINARY: No burning on urination, no polyuria NEUROLOGICAL: No headache, dizziness, syncope, paralysis, ataxia, numbness or tingling in the extremities. No change in bowel or bladder control.  MUSCULOSKELETAL: No muscle, back pain, joint pain or stiffness.  LYMPHATICS: No enlarged nodes. No history of splenectomy.  PSYCHIATRIC: No history of depression or anxiety.  ENDOCRINOLOGIC: No reports of sweating, cold or heat intolerance. No polyuria or polydipsia.  Marland Kitchen   Physical Examination There were no vitals filed for this visit. There were no vitals filed for this visit.  Gen: resting comfortably, no acute distress HEENT: no scleral icterus, pupils equal round and reactive, no palptable cervical adenopathy,  CV Resp: Clear to auscultation bilaterally GI: abdomen is soft, non-tender, non-distended, normal bowel sounds, no hepatosplenomegaly MSK: extremities are warm, no edema.  Skin: warm, no rash Neuro:  no focal deficits Psych: appropriate affect   Diagnostic Studies 02/2013  Exercise MPI - excecised 9 minutes, 10 METs, 85% of THR, no chest pain, no ischemic EKG changes, no ischemia on imaging, LVEF 67%     Assessment and Plan        Antoine Poche, M.D., F.A.C.C.

## 2013-10-09 ENCOUNTER — Encounter: Payer: Self-pay | Admitting: *Deleted

## 2013-10-26 ENCOUNTER — Encounter: Payer: BC Managed Care – PPO | Admitting: Adult Health

## 2013-10-26 NOTE — Progress Notes (Signed)
    HPI: [name] is a 63 year old patient of Dr. Harl Bowie were following for ongoing assessment and management of CAD with prior drug-eluting stent to the LAD in 2010, repeat cath in 2011 showing overall patent stent and coronaries. Process of September 2013 without ischemia Exercise tolerance. Followup stress in May of 2014 demonstrating normal without EKG changes. Other history includes hypertension, hyperlipidemia, and small cell lung CA.   Allergies  Allergen Reactions  . Cephalexin     REACTION: throat swelling    Current Outpatient Prescriptions  Medication Sig Dispense Refill  . aspirin 81 MG tablet Take 81 mg by mouth daily.       Marland Kitchen diltiazem (CARDIZEM CD) 180 MG 24 hr capsule TAKE ONE CAPSULE BY MOUTH DAILY  90 capsule  3  . metoprolol succinate (TOPROL-XL) 50 MG 24 hr tablet TAKE 1 TABLET EVERY DAY  30 tablet  3  . rosuvastatin (CRESTOR) 40 MG tablet Take 1 tablet (40 mg total) by mouth daily.  90 tablet  1   No current facility-administered medications for this visit.    Past Medical History  Diagnosis Date  . ASCVD (arteriosclerotic cardiovascular disease)      DES placed for 95% proximal LAD stenosis in 07/2009; scattered 20-40% lesions; normal EF  . Carcinoma     RIGHT UPPER LOBE; SMALL CELL-2008; no evidence of disease since treatment ended in 11/2007  . Hypertension   . Hyperlipidemia     Past Surgical History  Procedure Laterality Date  . Cardiac catheterization   06/30/09     (endeaver 2.65mm x 21mm drug eluting stent)  . Right upper lobe small cell carcinoma      ROS: PHYSICAL EXAM There were no vitals taken for this visit.  EKG:  ASSESSMENT AND PLAN

## 2013-10-28 ENCOUNTER — Ambulatory Visit: Payer: BC Managed Care – PPO | Admitting: Adult Health

## 2013-10-29 NOTE — Progress Notes (Signed)
    HPI: [name] is a 63 y/o patient of Dr. Harl Bowie we are following for ongoing assessment and treatment of CAD, with history of DES to LAD in 2010, repeat cath in 2011 showing overall patent stents. His most recent stress test demonstrated no ischemia or EKG changes. Other history includes hypertension and hyperlipidemia. He did not see Dr. Harl Bowie to be established on scheduled appt in Nov of 2014.  Allergies  Allergen Reactions  . Cephalexin     REACTION: throat swelling    Current Outpatient Prescriptions  Medication Sig Dispense Refill  . aspirin 81 MG tablet Take 81 mg by mouth daily.       Marland Kitchen diltiazem (CARDIZEM CD) 180 MG 24 hr capsule TAKE ONE CAPSULE BY MOUTH DAILY  90 capsule  3  . metoprolol succinate (TOPROL-XL) 50 MG 24 hr tablet TAKE 1 TABLET EVERY DAY  30 tablet  3  . rosuvastatin (CRESTOR) 40 MG tablet Take 1 tablet (40 mg total) by mouth daily.  90 tablet  1   No current facility-administered medications for this visit.    Past Medical History  Diagnosis Date  . ASCVD (arteriosclerotic cardiovascular disease)      DES placed for 95% proximal LAD stenosis in 07/2009; scattered 20-40% lesions; normal EF  . Carcinoma     RIGHT UPPER LOBE; SMALL CELL-2008; no evidence of disease since treatment ended in 11/2007  . Hypertension   . Hyperlipidemia     Past Surgical History  Procedure Laterality Date  . Cardiac catheterization   06/30/09     (endeaver 2.70mm x 103mm drug eluting stent)  . Right upper lobe small cell carcinoma      ROS: PHYSICAL EXAM There were no vitals taken for this visit.  EKG:  ASSESSMENT AND PLAN

## 2013-10-30 ENCOUNTER — Encounter: Payer: Self-pay | Admitting: Adult Health

## 2013-11-06 ENCOUNTER — Ambulatory Visit: Payer: Self-pay | Admitting: Adult Health

## 2013-11-06 ENCOUNTER — Encounter: Payer: Self-pay | Admitting: *Deleted

## 2013-12-02 ENCOUNTER — Encounter (HOSPITAL_BASED_OUTPATIENT_CLINIC_OR_DEPARTMENT_OTHER): Payer: BC Managed Care – PPO

## 2013-12-02 ENCOUNTER — Encounter (HOSPITAL_COMMUNITY): Payer: BC Managed Care – PPO | Attending: Hematology and Oncology

## 2013-12-02 ENCOUNTER — Encounter (HOSPITAL_COMMUNITY): Payer: Self-pay

## 2013-12-02 VITALS — BP 149/85 | HR 72 | Temp 97.3°F | Resp 20 | Wt 219.7 lb

## 2013-12-02 DIAGNOSIS — E785 Hyperlipidemia, unspecified: Secondary | ICD-10-CM | POA: Insufficient documentation

## 2013-12-02 DIAGNOSIS — J449 Chronic obstructive pulmonary disease, unspecified: Secondary | ICD-10-CM | POA: Insufficient documentation

## 2013-12-02 DIAGNOSIS — C3410 Malignant neoplasm of upper lobe, unspecified bronchus or lung: Secondary | ICD-10-CM

## 2013-12-02 DIAGNOSIS — Z9221 Personal history of antineoplastic chemotherapy: Secondary | ICD-10-CM | POA: Insufficient documentation

## 2013-12-02 DIAGNOSIS — I2510 Atherosclerotic heart disease of native coronary artery without angina pectoris: Secondary | ICD-10-CM | POA: Insufficient documentation

## 2013-12-02 DIAGNOSIS — Z87891 Personal history of nicotine dependence: Secondary | ICD-10-CM | POA: Insufficient documentation

## 2013-12-02 DIAGNOSIS — Z9861 Coronary angioplasty status: Secondary | ICD-10-CM | POA: Insufficient documentation

## 2013-12-02 DIAGNOSIS — Z85118 Personal history of other malignant neoplasm of bronchus and lung: Secondary | ICD-10-CM | POA: Insufficient documentation

## 2013-12-02 DIAGNOSIS — J4489 Other specified chronic obstructive pulmonary disease: Secondary | ICD-10-CM | POA: Insufficient documentation

## 2013-12-02 DIAGNOSIS — I10 Essential (primary) hypertension: Secondary | ICD-10-CM | POA: Insufficient documentation

## 2013-12-02 DIAGNOSIS — Z923 Personal history of irradiation: Secondary | ICD-10-CM | POA: Insufficient documentation

## 2013-12-02 DIAGNOSIS — C3490 Malignant neoplasm of unspecified part of unspecified bronchus or lung: Secondary | ICD-10-CM

## 2013-12-02 DIAGNOSIS — Z09 Encounter for follow-up examination after completed treatment for conditions other than malignant neoplasm: Secondary | ICD-10-CM | POA: Insufficient documentation

## 2013-12-02 LAB — COMPREHENSIVE METABOLIC PANEL
ALT: 15 U/L (ref 0–53)
AST: 14 U/L (ref 0–37)
Albumin: 4.3 g/dL (ref 3.5–5.2)
Alkaline Phosphatase: 81 U/L (ref 39–117)
BUN: 14 mg/dL (ref 6–23)
CALCIUM: 9.3 mg/dL (ref 8.4–10.5)
CO2: 26 mEq/L (ref 19–32)
Chloride: 103 mEq/L (ref 96–112)
Creatinine, Ser: 1.02 mg/dL (ref 0.50–1.35)
GFR calc non Af Amer: 77 mL/min — ABNORMAL LOW (ref >90)
GFR, EST AFRICAN AMERICAN: 89 mL/min — AB (ref >90)
Glucose, Bld: 91 mg/dL (ref 70–99)
Potassium: 4.5 mEq/L (ref 3.7–5.3)
Sodium: 141 mEq/L (ref 137–147)
TOTAL PROTEIN: 7.6 g/dL (ref 6.0–8.3)
Total Bilirubin: 0.5 mg/dL (ref 0.3–1.2)

## 2013-12-02 LAB — CBC WITH DIFFERENTIAL/PLATELET
Basophils Absolute: 0 10*3/uL (ref 0.0–0.1)
Basophils Relative: 1 % (ref 0–1)
Eosinophils Absolute: 0.1 10*3/uL (ref 0.0–0.7)
Eosinophils Relative: 2 % (ref 0–5)
HCT: 43.3 % (ref 39.0–52.0)
Hemoglobin: 14.6 g/dL (ref 13.0–17.0)
Lymphocytes Relative: 17 % (ref 12–46)
Lymphs Abs: 1.2 10*3/uL (ref 0.7–4.0)
MCH: 30.8 pg (ref 26.0–34.0)
MCHC: 33.7 g/dL (ref 30.0–36.0)
MCV: 91.4 fL (ref 78.0–100.0)
Monocytes Absolute: 0.6 10*3/uL (ref 0.1–1.0)
Monocytes Relative: 8 % (ref 3–12)
Neutro Abs: 5.4 10*3/uL (ref 1.7–7.7)
Neutrophils Relative %: 73 % (ref 43–77)
Platelets: 338 10*3/uL (ref 150–400)
RBC: 4.74 MIL/uL (ref 4.22–5.81)
RDW: 14.2 % (ref 11.5–15.5)
WBC: 7.4 10*3/uL (ref 4.0–10.5)

## 2013-12-02 LAB — LACTATE DEHYDROGENASE: LDH: 178 U/L (ref 94–250)

## 2013-12-02 MED ORDER — FLUTICASONE-SALMETEROL 500-50 MCG/DOSE IN AEPB: INHALATION_SPRAY | RESPIRATORY_TRACT | Status: DC

## 2013-12-02 NOTE — Progress Notes (Signed)
[place] [facility]  OFFICE PROGRESS Jeremy Miner, MD [address] [place] [address]  DIAGNOSIS: Lung cancer - Plan: CT Abdomen Pelvis W Contrast, CT Chest W Contrast, CBC with Differential, Comprehensive metabolic panel, Lactate dehydrogenase  Malignant neoplasm of upper lobe, bronchus or lung, unspecified laterality - Plan: Lactate dehydrogenase, Comprehensive metabolic panel, CBC with Differential  Chief Complaint  Patient presents with  . Follow-up  . Small cell lung cancer treatment ending in January 2009    CURRENT THERAPY: Watchful expectation and surveillance.  INTERVAL HISTORY: Jeremy Lopez 63 y.o. male returns for followup of small cell lung cancer diagnosed in August of 2008 and treated with chemotherapy, consolidative chest radiotherapy and prophylactic whole brain radiation with chemotherapy completed on 10/17/2007. Last CT scan followup in October 2014 showed no evidence of disease. In January 2015 the patient had influenza despite receiving influenza virus vaccine. He also had a recent bout of bronchitis while in [place] and treated himself with a Z-Pak with improvement in symptoms. He does have chronic cough but without expectoration or hemoptysis. Appetite is good with no fever, night sweats, headache, peripheral paresthesias, lower extremity swelling or redness, PND, orthopnea, palpitations, but with occasional shortness of breath from hours 8 to 9 on long international flights. 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  MEDICAL HISTORY: Past Medical History  Diagnosis Date  . ASCVD (arteriosclerotic cardiovascular disease)      DES placed for 95% proximal LAD stenosis in 07/2009; scattered 20-40% lesions; normal EF  . Carcinoma     RIGHT UPPER LOBE; SMALL CELL-2008; no evidence of disease since treatment ended in 11/2007  . Hypertension   . Hyperlipidemia     INTERIM HISTORY: has  CARCINOMA, RIGHT LUNG, SMALL CELL, UPPER LOBE; HYPERLIPIDEMIA; HYPERTENSION; and ATHEROSCLEROTIC CARDIOVASCULAR DISEASE on his problem list.   Problem #1 small cell lung cancer, limited stage disease, diagnosed in August 2008 status post 6 cycles of chemotherapy with cis-platinum, VP-16, and CCNU alternating with carboplatinum and VP-16 and Adriamycin.  She also have involved field radiation to the chest during cycles #5 and #6. He then had prophylactic cranial irradiation in his last chemotherapy was on 10/17/2007. Thus far he remains in complete remission without evidence for disease.  #2 CAD status post cardiac catheterization with stent placed in September 2000 and  #3 mild COPD  #4 history of hypertension     ALLERGIES:  is allergic to cephalexin.  MEDICATIONS: has a current medication list which includes the following prescription(s): aspirin, diltiazem, metoprolol succinate, rosuvastatin, and fluticasone-salmeterol.  SURGICAL HISTORY:  Past Surgical History  Procedure Laterality Date  . Cardiac catheterization   06/30/09     (endeaver 2.31mm x 25mm drug eluting stent)  . Right upper lobe small cell carcinoma      FAMILY HISTORY: family history is not on file.  SOCIAL HISTORY:  reports that he has quit smoking. He has never used smokeless tobacco. He reports that he drinks alcohol. He reports that he does not use illicit drugs.  REVIEW OF SYSTEMS:  Other than that discussed above is noncontributory.  PHYSICAL EXAMINATION: ECOG PERFORMANCE STATUS: 1 - Symptomatic but completely ambulatory  Blood pressure 149/85, pulse 72, temperature 97.3 F (36.3 C), temperature source Oral, resp. rate 20, weight 219 lb 11.2 oz (99.655 kg).  GENERAL:alert, no distress and comfortable SKIN: skin color, texture, turgor are normal, no rashes or significant lesions. Male pattern  baldness. EYES: PERLA; Conjunctiva are pink and non-injected, sclera clear OROPHARYNX:no exudate, no erythema on lips,  buccal mucosa, or tongue. NECK: supple, thyroid normal size, non-tender, without nodularity. No masses CHEST: Increased AP diameter with no gynecomastia. LYMPH:  no palpable lymphadenopathy in the cervical, axillary or inguinal LUNGS: clear to auscultation and percussion with normal breathing effort HEART: regular rate & rhythm and no murmurs. ABDOMEN:abdomen soft, non-tender and normal bowel sounds MUSCULOSKELETAL:no cyanosis of digits and no clubbing. Range of motion normal.  NEURO: alert & oriented x 3 with fluent speech, no focal motor/sensory deficits   LABORATORY DATA: Office Visit on 12/02/2013  Component Date Value Ref Range Status  . WBC 12/02/2013 7.4  4.0 - 10.5 K/uL Final  . RBC 12/02/2013 4.74  4.22 - 5.81 MIL/uL Final  . Hemoglobin 12/02/2013 14.6  13.0 - 17.0 g/dL Final  . HCT 12/02/2013 43.3  39.0 - 52.0 % Final  . MCV 12/02/2013 91.4  78.0 - 100.0 fL Final  . MCH 12/02/2013 30.8  26.0 - 34.0 pg Final  . MCHC 12/02/2013 33.7  30.0 - 36.0 g/dL Final  . RDW 12/02/2013 14.2  11.5 - 15.5 % Final  . Platelets 12/02/2013 338  150 - 400 K/uL Final  . Neutrophils Relative % 12/02/2013 73  43 - 77 % Final  . Neutro Abs 12/02/2013 5.4  1.7 - 7.7 K/uL Final  . Lymphocytes Relative 12/02/2013 17  12 - 46 % Final  . Lymphs Abs 12/02/2013 1.2  0.7 - 4.0 K/uL Final  . Monocytes Relative 12/02/2013 8  3 - 12 % Final  . Monocytes Absolute 12/02/2013 0.6  0.1 - 1.0 K/uL Final  . Eosinophils Relative 12/02/2013 2  0 - 5 % Final  . Eosinophils Absolute 12/02/2013 0.1  0.0 - 0.7 K/uL Final  . Basophils Relative 12/02/2013 1  0 - 1 % Final  . Basophils Absolute 12/02/2013 0.0  0.0 - 0.1 K/uL Final    PATHOLOGY: No new pathology.  Urinalysis No results found for this basename: colorurine,  appearanceur,  labspec,  phurine,  glucoseu,  hgbur,  bilirubinur,  ketonesur,  proteinur,  urobilinogen,  nitrite,  leukocytesur    RADIOGRAPHIC STUDIES: CT Abdomen Pelvis W Contrast Status:  Final result         PACS Images    Show images for CT Abdomen Pelvis W Contrast         Study Result    CLINICAL DATA: Followup small cell right upper lobe lung carcinoma.  EXAM:  CT CHEST, ABDOMEN, AND PELVIS WITH CONTRAST  TECHNIQUE:  Multidetector CT imaging of the chest, abdomen and pelvis was  performed following the standard protocol during bolus  administration of intravenous contrast.  CONTRAST: 152mL OMNIPAQUE IOHEXOL 300 MG/ML SOLN  COMPARISON: 07/21/2012  FINDINGS:  CT CHEST FINDINGS  Post radiation changes in the right paramediastinal lung zone remain  stable. No evidence of acute infiltrate or central endobronchial  lesion. No suspicious pulmonary nodules or masses are identified.  No evidence of pleural or pericardial effusion. Small less than 1 cm  right paratracheal lymph nodes are stable. No lymphadenopathy seen  within the thorax. No evidence of chest wall mass or suspicious bone  lesions.  CT ABDOMEN AND PELVIS FINDINGS  The liver, gallbladder, pancreas, spleen, adrenal glands, and  kidneys are normal in appearance. No evidence of hydronephrosis. No  soft tissue masses or lymphadenopathy identified within the abdomen  or pelvis.  No evidence of inflammatory process or abnormal fluid  collections.  No evidence of bowel wall thickening or dilatation. No suspicious  bone lesions identified. Probable bone island in the proximal right  femur stable.  IMPRESSION:  Stable exam. No evidence of recurrent metastatic carcinoma, or other  acute findings.  Electronically Signed  By: Earle Gell M.D.  On: 07/28/2013 13:53      ASSESSMENT:  #1. Limited stage small cell lung cancer, no evidence of disease. #2. Chronic obstructive pulmonary disease, stable, nonsmoking since age 79. #3. Coronary artery disease, status post stenting with no evidence of heart failure or dysrhythmia. Stent was placed in September 2000.   PLAN:  #1. Advair Diskus 500/50 once  daily. #2. Repeat CT scan chest abdomen and pelvis with contrast on October 2015. #3. Followup in 6 months with CBC, chem profile, and LDH. #4. 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   All questions were answered. The patient knows to call the clinic with any problems, questions or concerns. We can certainly see the patient much sooner if necessary.   I spent 25 minutes counseling the patient face to face. The total time spent in the appointment was 30 minutes.    Doroteo Bradford, MD 12/02/2013 12:58 PM

## 2013-12-02 NOTE — Progress Notes (Signed)
Labs drawn today for cbc/diff,cmp,ldh

## 2013-12-02 NOTE — Patient Instructions (Signed)
[facility] Discharge Instructions  RECOMMENDATIONS MADE BY THE CONSULTANT AND ANY TEST RESULTS WILL BE SENT TO YOUR REFERRING PHYSICIAN.  EXAM FINDINGS BY THE PHYSICIAN TODAY AND SIGNS OR SYMPTOMS TO REPORT TO CLINIC OR PRIMARY PHYSICIAN: Exam and findings as discussed by Dr. Barnet Glasgow.  MEDICATIONS PRESCRIBED:   Advair   INSTRUCTIONS/FOLLOW-UP:  Return in 6 months for lab work and follow-up visit.    Thank you for choosing [facility] to provide your oncology and hematology care.  To afford each patient quality time with our providers, please arrive at least 15 minutes before your scheduled appointment time.  With your help, our goal is to use those 15 minutes to complete the necessary work-up to ensure our physicians have the information they need to help with your evaluation and healthcare recommendations.    Effective January 1st, 2014, we ask that you re-schedule your appointment with our physicians should you arrive 10 or more minutes late for your appointment.  We strive to give you quality time with our providers, and arriving late affects you and other patients whose appointments are after yours.    Again, thank you for choosing [facility].  Our hope is that these requests will decrease the amount of time that you wait before being seen by our physicians.       _____________________________________________________________  Should you have questions after your visit to [facility], please contact our office at [phone] between the hours of 8:30 a.m. and 5:00 p.m.  Voicemails left after 4:30 p.m. will not be returned until the following business day.  For prescription refill requests, have your pharmacy contact our office with your prescription refill request.

## 2013-12-07 ENCOUNTER — Other Ambulatory Visit (HOSPITAL_COMMUNITY): Payer: Self-pay | Admitting: Oncology

## 2013-12-07 ENCOUNTER — Encounter (HOSPITAL_COMMUNITY): Payer: Self-pay | Admitting: Oncology

## 2013-12-07 ENCOUNTER — Telehealth (HOSPITAL_COMMUNITY): Payer: Self-pay | Admitting: Oncology

## 2013-12-07 DIAGNOSIS — J449 Chronic obstructive pulmonary disease, unspecified: Secondary | ICD-10-CM

## 2013-12-07 MED ORDER — FLUTICASONE-SALMETEROL 250-50 MCG/DOSE IN AEPB: 1.0000 | INHALATION_SPRAY | Freq: Every day | RESPIRATORY_TRACT | Status: DC

## 2013-12-07 NOTE — Telephone Encounter (Signed)
Patient called reporting increased "jitteriness" after taking Advair.  He otherwise tolerated medication well.  He asked if this was a side effect of the medication, which it is.  He also asked if there was a lower dose.  He is on 500-50.  I have decreased the dose to 250-50 and wrote a new Rx for the advair and this was escribed to [facility].  New letter releasing him back to wrote was typed and printed today as well to return back to work, without restriction, tomorrow, 12/08/13.   KEFALAS,THOMAS

## 2013-12-22 ENCOUNTER — Encounter: Payer: Self-pay | Admitting: Adult Health

## 2013-12-22 ENCOUNTER — Other Ambulatory Visit: Payer: Self-pay

## 2013-12-22 MED ORDER — METOPROLOL SUCCINATE ER 50 MG PO TB24: ORAL_TABLET | ORAL | Status: DC

## 2013-12-22 NOTE — Progress Notes (Signed)
    HPI: [name] is a 63 year old patient of Dr. Leanord Lopez following for ongoing assessment and management of CAD, prior drug-eluting stent to the LAD in 2010, repeat cath in 2011 showing overall patent stent and coronaries. He had followup stress test in September 2013 which was normal. A followup stress test in May of 2014 which was also found to be normal. 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  Other history includes small cell lung CA, diagnosed in 2008 with 6 cycles of chemotherapy, and hyperlipidemia. The patient has canceled many followup appointments.  Allergies  Allergen Reactions  . Cephalexin     REACTION: throat swelling    Current Outpatient Prescriptions  Medication Sig Dispense Refill  . aspirin 81 MG tablet Take 81 mg by mouth daily.       Marland Kitchen diltiazem (CARDIZEM CD) 180 MG 24 hr capsule TAKE ONE CAPSULE BY MOUTH DAILY  90 capsule  3  . Fluticasone-Salmeterol (ADVAIR DISKUS) 250-50 MCG/DOSE AEPB Inhale 1 puff into the lungs daily.  60 each  2  . metoprolol succinate (TOPROL-XL) 50 MG 24 hr tablet TAKE 1 TABLET EVERY DAY  30 tablet  3  . rosuvastatin (CRESTOR) 40 MG tablet Take 1 tablet (40 mg total) by mouth daily.  90 tablet  1   No current facility-administered medications for this visit.    Past Medical History  Diagnosis Date  . ASCVD (arteriosclerotic cardiovascular disease)      DES placed for 95% proximal LAD stenosis in 07/2009; scattered 20-40% lesions; normal EF  . Carcinoma     RIGHT UPPER LOBE; SMALL CELL-2008; no evidence of disease since treatment ended in 11/2007  . Hypertension   . Hyperlipidemia     Past Surgical History  Procedure Laterality Date  . Cardiac catheterization   06/30/09     (endeaver 2.61mm x 5mm drug eluting stent)  . Right upper lobe small cell carcinoma      ROS: PHYSICAL EXAM There were no vitals taken for this visit.  EKG:  ASSESSMENT AND PLAN

## 2013-12-24 ENCOUNTER — Other Ambulatory Visit: Payer: Self-pay | Admitting: *Deleted

## 2013-12-24 ENCOUNTER — Ambulatory Visit: Payer: Self-pay | Admitting: Adult Health

## 2013-12-24 DIAGNOSIS — I2510 Atherosclerotic heart disease of native coronary artery without angina pectoris: Secondary | ICD-10-CM

## 2014-01-04 ENCOUNTER — Encounter (HOSPITAL_COMMUNITY): Payer: BC Managed Care – PPO

## 2014-01-04 ENCOUNTER — Ambulatory Visit (HOSPITAL_COMMUNITY): Admission: RE | Admit: 2014-01-04 | Payer: BC Managed Care – PPO | Source: Ambulatory Visit

## 2014-01-04 ENCOUNTER — Ambulatory Visit: Payer: BC Managed Care – PPO | Admitting: Internal Medicine

## 2014-01-05 ENCOUNTER — Encounter (HOSPITAL_COMMUNITY): Payer: Self-pay

## 2014-01-05 ENCOUNTER — Ambulatory Visit (HOSPITAL_COMMUNITY)
Admission: RE | Admit: 2014-01-05 | Discharge: 2014-01-05 | Disposition: A | Discharge status: Home / Self Care | Payer: BC Managed Care – PPO | Source: Ambulatory Visit | Attending: Adult Health | Admitting: Adult Health

## 2014-01-05 DIAGNOSIS — I2510 Atherosclerotic heart disease of native coronary artery without angina pectoris: Secondary | ICD-10-CM

## 2014-01-05 DIAGNOSIS — Z0289 Encounter for other administrative examinations: Secondary | ICD-10-CM | POA: Insufficient documentation

## 2014-01-05 NOTE — Progress Notes (Addendum)
Stress Lab Nurses Notes - Jidenna Figgs 01/05/2014 Reason for doing test: Physical Type of test: Regular GTX Nurse performing test: Gerrit Halls, RN Nuclear Medicine Tech: Not Applicable Echo Tech: Not Applicable MD performing test: Branch/K.Lawrence NP Family MD: Willey Blade Test explained and consent signed: yes IV started: No IV started Symptoms: fatigue Treatment/Intervention: None Reason test stopped: fatigue and reached target HR After recovery IV was: NA Patient to return to [facility]. Med at : NA Patient discharged: Home Patient's Condition upon discharge was: stable Comments: During test peak BP 196/78 & HR 164.  Recovery BP 137/96 & HR 103.  Symptoms resolved in recovery. Geanie Cooley T   Patient exercised according to the Bruce protocol for 8 min, achieving a workload of 10.10 METs. Resting heart rate increased from 85 bpm to 164 bpm (103% of THR) and blood pressure increased from 150/98 to 196/78. The test was stop due to fatigue, the patient did not experience any chest pain. Baseline EKG showed NSR. Stress EKG showed no ischemic changes and no arrhythmias.  Conclusions 1. Negative exercise stress test for ischemia 2. Duke treadmill score of 8, consistent with low risk for major cardiac events 3. Very good functional capacity (120% of predicted based on age and gender)     Carlyle Dolly MD

## 2014-01-08 ENCOUNTER — Ambulatory Visit (INDEPENDENT_AMBULATORY_CARE_PROVIDER_SITE_OTHER): Payer: BC Managed Care – PPO | Admitting: Internal Medicine

## 2014-01-08 VITALS — BP 110/67 | HR 72 | Ht 70.0 in | Wt 220.0 lb

## 2014-01-08 DIAGNOSIS — E785 Hyperlipidemia, unspecified: Secondary | ICD-10-CM

## 2014-01-08 DIAGNOSIS — I2510 Atherosclerotic heart disease of native coronary artery without angina pectoris: Secondary | ICD-10-CM

## 2014-01-08 DIAGNOSIS — I10 Essential (primary) hypertension: Secondary | ICD-10-CM

## 2014-01-08 MED ORDER — DILTIAZEM HCL ER COATED BEADS 180 MG PO CP24: 180.0000 mg | ORAL_CAPSULE | Freq: Every day | ORAL | Status: DC

## 2014-01-08 NOTE — Progress Notes (Deleted)
Name: Jeremy Lopez    DOB: 12-Nov-1950  Age: 63 y.o.  MR#: [id number]       PCP:  Asencion Noble, MD      Insurance: Payor: [place] / Plan: [place] / Product Type: *No Product type* /   CC:   No chief complaint on file.   VS Filed Vitals:   01/08/14 1428  BP: 110/67  Pulse: 72  Height: 5\' 10"  (1.778 m)  Weight: 220 lb (99.791 kg)  SpO2: 98%    Weights Current Weight  01/08/14 220 lb (99.791 kg)  12/02/13 219 lb 11.2 oz (99.655 kg)  01/26/13 222 lb (100.699 kg)    Blood Pressure  BP Readings from Last 3 Encounters:  01/08/14 110/67  12/02/13 149/85  01/26/13 122/86     Admit date:  (Not on file) Last encounter with RMR:  Visit date not found   Allergy Cephalexin  Current Outpatient Prescriptions  Medication Sig Dispense Refill  . aspirin 81 MG tablet Take 81 mg by mouth daily.       Marland Kitchen diltiazem (CARDIZEM CD) 180 MG 24 hr capsule TAKE ONE CAPSULE BY MOUTH DAILY  90 capsule  3  . Fluticasone-Salmeterol (ADVAIR DISKUS) 250-50 MCG/DOSE AEPB Inhale 1 puff into the lungs daily.  60 each  2  . metoprolol succinate (TOPROL-XL) 50 MG 24 hr tablet TAKE 1 TABLET EVERY DAY  30 tablet  3  . rosuvastatin (CRESTOR) 40 MG tablet Take 1 tablet (40 mg total) by mouth daily.  90 tablet  1   No current facility-administered medications for this visit.    Discontinued Meds:   There are no discontinued medications.  Patient Active Problem List   Diagnosis Date Noted  . CARCINOMA, RIGHT LUNG, SMALL CELL, UPPER LOBE 02/05/2010  . ATHEROSCLEROTIC CARDIOVASCULAR DISEASE 02/05/2010  . HYPERLIPIDEMIA 08/16/2009  . HYPERTENSION 07/08/2009    LABS    Component Value Date/Time   NA 141 12/02/2013 1147   NA 138 07/20/2013 1129   NA 139 02/02/2013 1210   K 4.5 12/02/2013 1147   K 4.1 07/20/2013 1129   K 4.6 02/02/2013 1210   CL 103 12/02/2013 1147   CL 99 07/20/2013 1129   CL 103 02/02/2013 1210   CO2 26 12/02/2013 1147   CO2 27 07/20/2013 1129   CO2 26 02/02/2013  1210   GLUCOSE 91 12/02/2013 1147   GLUCOSE 103* 07/20/2013 1129   GLUCOSE 92 02/02/2013 1210   BUN 14 12/02/2013 1147   BUN 16 07/20/2013 1129   BUN 14 02/02/2013 1210   CREATININE 1.02 12/02/2013 1147   CREATININE 1.18 07/20/2013 1129   CREATININE 1.09 02/02/2013 1210   CREATININE 1.02 01/20/2013 1114   CALCIUM 9.3 12/02/2013 1147   CALCIUM 9.3 07/20/2013 1129   CALCIUM 9.3 02/02/2013 1210   GFRNONAA 77* 12/02/2013 1147   GFRNONAA 64* 07/20/2013 1129   GFRNONAA 77* 01/20/2013 1114   GFRAA 89* 12/02/2013 1147   GFRAA 75* 07/20/2013 1129   GFRAA 90* 01/20/2013 1114   CMP     Component Value Date/Time   NA 141 12/02/2013 1147   K 4.5 12/02/2013 1147   CL 103 12/02/2013 1147   CO2 26 12/02/2013 1147   GLUCOSE 91 12/02/2013 1147   BUN 14 12/02/2013 1147   CREATININE 1.02 12/02/2013 1147   CREATININE 1.09 02/02/2013 1210   CALCIUM 9.3 12/02/2013 1147   PROT 7.6 12/02/2013 1147   ALBUMIN 4.3 12/02/2013 1147   AST  14 12/02/2013 1147   ALT 15 12/02/2013 1147   ALKPHOS 81 12/02/2013 1147   BILITOT 0.5 12/02/2013 1147   GFRNONAA 77* 12/02/2013 1147   GFRAA 89* 12/02/2013 1147       Component Value Date/Time   WBC 7.4 12/02/2013 1147   WBC 7.4 07/20/2013 1129   WBC 7.3 04/11/2012 0918   WBC 11.6* 10/14/2007 1541   WBC 7.7 10/07/2007 1537   WBC 6.9 09/30/2007 1544   HGB 14.6 12/02/2013 1147   HGB 14.7 07/20/2013 1129   HGB 14.6 01/19/2013 1100   HGB 11.8* 10/14/2007 1541   HGB 11.8* 10/07/2007 1537   HGB 10.1* 09/30/2007 1544   HCT 43.3 12/02/2013 1147   HCT 43.5 07/20/2013 1129   HCT 43.0 01/19/2013 1100   HCT 35.2* 10/14/2007 1541   HCT 35.1* 10/07/2007 1537   HCT 29.1* 09/30/2007 1544   MCV 91.4 12/02/2013 1147   MCV 91.2 07/20/2013 1129   MCV 91.9 04/11/2012 0918   MCV 96.4 10/14/2007 1541   MCV 96.4 10/07/2007 1537   MCV 93.0 09/30/2007 1544    Lipid Panel     Component Value Date/Time   CHOL 113 02/02/2013 1210   TRIG 84 02/02/2013 1210   HDL 45 02/02/2013 1210   CHOLHDL 2.5 02/02/2013 1210    VLDL 17 02/02/2013 1210   LDLCALC 51 02/02/2013 1210    ABG    Component Value Date/Time   PHART 7.411 09/22/2010 0910   PCO2ART 36.8 09/22/2010 0910   PO2ART 96.7 09/22/2010 0910   HCO3 22.9 09/22/2010 0910   TCO2 25 01/19/2013 1100   ACIDBASEDEF 1.0 09/22/2010 0910   O2SAT 97.6 09/22/2010 0910     Lab Results  Component Value Date   TSH 3.233 01/20/2013   BNP (last 3 results) No results found for this basename: PROBNP,  in the last 8760 hours Cardiac Panel (last 3 results) No results found for this basename: CKTOTAL, CKMB, TROPONINI, RELINDX,  in the last 72 hours  Iron/TIBC/Ferritin No results found for this basename: iron, tibc, ferritin     EKG Orders placed during the hospital encounter of 02/02/13  . EXERCISE TOLERANCE TEST  . EXERCISE TOLERANCE TEST     Prior Assessment and Plan Problem List as of 01/08/2014     Cardiovascular and Mediastinum   HYPERTENSION   Last Assessment & Plan   01/26/2013 Office Visit Written 01/26/2013  4:02 PM by Lendon Colonel, NP     Excellent control of blood pressure at time of this visit. We will continue him on his current medication regimen without changes.     ATHEROSCLEROTIC CARDIOVASCULAR DISEASE   Last Assessment & Plan   01/26/2013 Office Visit Written 01/26/2013  4:02 PM by Lendon Colonel, NP     He is doing well currently without any cardiac complaints. We will schedule him for a GXT per FAA guidelines. This will be completed next week along with fasting lipids and LFTs, and a BMET. The patient is requesting a full package and interpretation of test to be sent to the [facility] after completion. The patient's represent number PI #[id number] M. ID [id number], APP-ID number [id number]. These numbers should be placed on report.      Respiratory   CARCINOMA, RIGHT LUNG, SMALL CELL, UPPER LOBE     Other   HYPERLIPIDEMIA   Last Assessment & Plan   01/26/2013 Office Visit Written 01/26/2013  4:03 PM by Lendon Colonel, NP      Cholesterol  status will be evaluated by labs next week. Copies of labs will be sent to his [place] authorization board.        Imaging: No results found.

## 2014-01-08 NOTE — Progress Notes (Signed)
   HPI: Mr. 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 patient was last in clinic in November. Since seen he has done well  No CP  No chest pressure.  No SOB  No palpitations.  No dizziness.  Walks 2 miles per day .   Allergies  Allergen Reactions  . Cephalexin     REACTION: throat swelling    Current Outpatient Prescriptions  Medication Sig Dispense Refill  . aspirin 81 MG tablet Take 81 mg by mouth daily.       Marland Kitchen diltiazem (CARDIZEM CD) 180 MG 24 hr capsule Take 1 capsule (180 mg total) by mouth daily.  90 capsule  3  . Fluticasone-Salmeterol (ADVAIR DISKUS) 250-50 MCG/DOSE AEPB Inhale 1 puff into the lungs daily.  60 each  2  . metoprolol succinate (TOPROL-XL) 50 MG 24 hr tablet TAKE 1 TABLET EVERY DAY  30 tablet  3  . rosuvastatin (CRESTOR) 40 MG tablet Take 1 tablet (40 mg total) by mouth daily.  90 tablet  1   No current facility-administered medications for this visit.    Past Medical History  Diagnosis Date  . ASCVD (arteriosclerotic cardiovascular disease)      DES placed for 95% proximal LAD stenosis in 07/2009; scattered 20-40% lesions; normal EF  . Carcinoma     RIGHT UPPER LOBE; SMALL CELL-2008; no evidence of disease since treatment ended in 11/2007  . Hypertension   . Hyperlipidemia     Past Surgical History  Procedure Laterality Date  . Cardiac catheterization   06/30/09     (endeaver 2.41mm x 64mm drug eluting stent)  . Right upper lobe small cell carcinoma      TDD:[place] of systems complete and found to be negative unless listed above  PHYSICAL EXAM BP 110/67  Pulse 72  Ht 5\' 10"  (1.778 m)  Wt 220 lb (99.791 kg)  BMI 31.57 kg/m2  SpO2 98%  General: Well developed, well nourished, in no acute distress Head: Eyes  PERRLA, No xanthomas.   Normal cephalic and atramatic  Lungs: Clear bilaterally to auscultation and percussion. Heart: HRRR S1 S2, without MRG.  Pulses are 2+ & equal.            No carotid bruit. No JVD.  No abdominal bruits. No femoral bruits. Abdomen: Bowel sounds are positive, abdomen soft and non-tender without masses or                  Hernia's noted. Msk:  Back normal, normal gait. Normal strength and tone for age. Extremities: No clubbing, cyanosis or edema.  DP +1 Neuro: Alert and oriented X 3. Psych:  Good affect, responds appropriately  ASSESSMENT AND PLAN  1.  CAD  Doing good  No symptoms to sugg angina.  GXT this week was normal.  No ischemic changes.  Adequate work load, reaching greater than 100% max predicted HR Will forward this to [facility] with labs  2.  HL  Will get labs from Dr Ria Comment office  3.  HTN  Good control on meds  4.  Obesity  Discussed wt loss  Encouraged him to increase physical to do this.

## 2014-01-08 NOTE — Patient Instructions (Signed)
Your physician wants you to follow-up in: 1 year You will receive a reminder letter in the mail two months in advance. If you don't receive a letter, please call our office to schedule the follow-up appointment.    Your physician recommends that you continue on your current medications as directed. Please refer to the Current Medication list given to you today.     Thank you for choosing [facility] !  

## 2014-01-11 ENCOUNTER — Telehealth: Payer: Self-pay

## 2014-01-11 NOTE — Telephone Encounter (Signed)
Per Dr.Fagan's office, no blood work at all in their office to report

## 2014-01-14 ENCOUNTER — Telehealth: Payer: Self-pay | Admitting: *Deleted

## 2014-01-14 NOTE — Telephone Encounter (Signed)
Pt states he was told that there would be some paperwork for him left on Monday he was calling to check on that. Nothing is up front.

## 2014-01-14 NOTE — Telephone Encounter (Signed)
No lab work from Dr Beverlee Nims office to pick up. Printed off last set of labs in Epic so pt can pick that up at the front.

## 2014-01-18 NOTE — Telephone Encounter (Signed)
Need to set up for fasting lipid panel

## 2014-02-05 ENCOUNTER — Telehealth: Payer: Self-pay | Admitting: *Deleted

## 2014-02-05 DIAGNOSIS — E785 Hyperlipidemia, unspecified: Secondary | ICD-10-CM

## 2014-02-05 DIAGNOSIS — I2510 Atherosclerotic heart disease of native coronary artery without angina pectoris: Secondary | ICD-10-CM

## 2014-02-05 NOTE — Telephone Encounter (Signed)
Please advise if I can put the orders in.

## 2014-02-05 NOTE — Telephone Encounter (Signed)
Pt need new labs drawn for clearance for FDA fasting blood sugars and lipid, including total cholesterol HDL, LDL triglycerides  and potassium.   Please call patient when order is out in.

## 2014-02-08 LAB — CBC
HEMATOCRIT: 42 % (ref 39.0–52.0)
HEMOGLOBIN: 14.4 g/dL (ref 13.0–17.0)
MCH: 30.1 pg (ref 26.0–34.0)
MCHC: 34.3 g/dL (ref 30.0–36.0)
MCV: 87.7 fL (ref 78.0–100.0)
Platelets: 335 10*3/uL (ref 150–400)
RBC: 4.79 MIL/uL (ref 4.22–5.81)
RDW: 14.4 % (ref 11.5–15.5)
WBC: 6.5 10*3/uL (ref 4.0–10.5)

## 2014-02-08 LAB — LIPID PANEL
Cholesterol: 134 mg/dL (ref 0–200)
HDL: 46 mg/dL (ref >39)
LDL CALC: 64 mg/dL (ref 0–99)
Total CHOL/HDL Ratio: 2.9 Ratio
Triglycerides: 121 mg/dL (ref <150)
VLDL: 24 mg/dL (ref 0–40)

## 2014-02-08 LAB — COMPREHENSIVE METABOLIC PANEL
ALK PHOS: 73 U/L (ref 39–117)
ALT: 15 U/L (ref 0–53)
AST: 12 U/L (ref 0–37)
Albumin: 4.5 g/dL (ref 3.5–5.2)
BILIRUBIN TOTAL: 0.6 mg/dL (ref 0.2–1.2)
BUN: 19 mg/dL (ref 6–23)
CO2: 25 mEq/L (ref 19–32)
CREATININE: 1.09 mg/dL (ref 0.50–1.35)
Calcium: 9.5 mg/dL (ref 8.4–10.5)
Chloride: 103 mEq/L (ref 96–112)
Glucose, Bld: 93 mg/dL (ref 70–99)
Potassium: 4.5 mEq/L (ref 3.5–5.3)
Sodium: 138 mEq/L (ref 135–145)
Total Protein: 6.7 g/dL (ref 6.0–8.3)

## 2014-02-08 LAB — HEMOGLOBIN A1C
Hgb A1c MFr Bld: 5.8 % — ABNORMAL HIGH (ref <5.7)
Mean Plasma Glucose: 120 mg/dL — ABNORMAL HIGH (ref <117)

## 2014-02-08 NOTE — Telephone Encounter (Signed)
Yes, please order fasting CMET, CBC, fasting lipid panel, and Hgb A1c   Carlyle Dolly MD

## 2014-02-08 NOTE — Telephone Encounter (Signed)
Orders put in. Pt was notified.

## 2014-02-10 ENCOUNTER — Telehealth: Payer: Self-pay | Admitting: *Deleted

## 2014-02-10 NOTE — Telephone Encounter (Signed)
Left message to call back  

## 2014-02-10 NOTE — Telephone Encounter (Signed)
Pt is calling for lab results

## 2014-02-11 ENCOUNTER — Telehealth: Payer: Self-pay | Admitting: Adult Health

## 2014-02-11 MED ORDER — ROSUVASTATIN CALCIUM 40 MG PO TABS: 40.0000 mg | ORAL_TABLET | Freq: Every day | ORAL | Status: DC

## 2014-02-11 NOTE — Telephone Encounter (Signed)
Pt will pick up labs at the front desk

## 2014-02-11 NOTE — Telephone Encounter (Signed)
Medication sent via escribe.  

## 2014-02-11 NOTE — Telephone Encounter (Signed)
Patient is wanting to pick up hard copy of labs / tgs

## 2014-02-17 ENCOUNTER — Encounter: Payer: Self-pay | Admitting: *Deleted

## 2014-03-05 NOTE — Progress Notes (Signed)
This encounter was created in error - please disregard.

## 2014-05-24 ENCOUNTER — Telehealth: Payer: Self-pay | Admitting: Adult Health

## 2014-05-24 MED ORDER — METOPROLOL SUCCINATE ER 50 MG PO TB24: ORAL_TABLET | ORAL | Status: DC

## 2014-05-24 NOTE — Telephone Encounter (Signed)
Needs Metoprolol called in to [facility] / tgs

## 2014-05-24 NOTE — Telephone Encounter (Signed)
Refill complete 

## 2014-06-01 ENCOUNTER — Ambulatory Visit (HOSPITAL_COMMUNITY): Payer: Self-pay

## 2014-06-01 ENCOUNTER — Encounter (HOSPITAL_COMMUNITY): Payer: BC Managed Care – PPO | Attending: Hematology and Oncology

## 2014-06-01 DIAGNOSIS — C3410 Malignant neoplasm of upper lobe, unspecified bronchus or lung: Secondary | ICD-10-CM

## 2014-06-01 DIAGNOSIS — C3490 Malignant neoplasm of unspecified part of unspecified bronchus or lung: Secondary | ICD-10-CM

## 2014-06-01 LAB — CBC WITH DIFFERENTIAL/PLATELET
BASOS ABS: 0 10*3/uL (ref 0.0–0.1)
BASOS PCT: 1 % (ref 0–1)
EOS PCT: 1 % (ref 0–5)
Eosinophils Absolute: 0.1 10*3/uL (ref 0.0–0.7)
HCT: 40.9 % (ref 39.0–52.0)
Hemoglobin: 13.8 g/dL (ref 13.0–17.0)
LYMPHS PCT: 17 % (ref 12–46)
Lymphs Abs: 1.1 10*3/uL (ref 0.7–4.0)
MCH: 30.5 pg (ref 26.0–34.0)
MCHC: 33.7 g/dL (ref 30.0–36.0)
MCV: 90.5 fL (ref 78.0–100.0)
Monocytes Absolute: 0.6 10*3/uL (ref 0.1–1.0)
Monocytes Relative: 9 % (ref 3–12)
Neutro Abs: 4.9 10*3/uL (ref 1.7–7.7)
Neutrophils Relative %: 72 % (ref 43–77)
PLATELETS: 316 10*3/uL (ref 150–400)
RBC: 4.52 MIL/uL (ref 4.22–5.81)
RDW: 14.1 % (ref 11.5–15.5)
WBC: 6.7 10*3/uL (ref 4.0–10.5)

## 2014-06-01 LAB — COMPREHENSIVE METABOLIC PANEL
ALBUMIN: 3.9 g/dL (ref 3.5–5.2)
ALT: 16 U/L (ref 0–53)
AST: 13 U/L (ref 0–37)
Alkaline Phosphatase: 73 U/L (ref 39–117)
Anion gap: 13 (ref 5–15)
BUN: 19 mg/dL (ref 6–23)
CALCIUM: 8.8 mg/dL (ref 8.4–10.5)
CO2: 24 mEq/L (ref 19–32)
Chloride: 102 mEq/L (ref 96–112)
Creatinine, Ser: 1.07 mg/dL (ref 0.50–1.35)
GFR calc Af Amer: 83 mL/min — ABNORMAL LOW (ref >90)
GFR calc non Af Amer: 72 mL/min — ABNORMAL LOW (ref >90)
Glucose, Bld: 118 mg/dL — ABNORMAL HIGH (ref 70–99)
Potassium: 3.7 mEq/L (ref 3.7–5.3)
SODIUM: 139 meq/L (ref 137–147)
TOTAL PROTEIN: 6.8 g/dL (ref 6.0–8.3)
Total Bilirubin: 0.3 mg/dL (ref 0.3–1.2)

## 2014-06-01 LAB — LACTATE DEHYDROGENASE: LDH: 154 U/L (ref 94–250)

## 2014-06-01 NOTE — Progress Notes (Signed)
LABS FOR CBCD,LDH,CMP

## 2014-06-23 ENCOUNTER — Encounter (HOSPITAL_COMMUNITY): Payer: Self-pay

## 2014-06-23 ENCOUNTER — Encounter (HOSPITAL_COMMUNITY): Payer: BC Managed Care – PPO | Attending: Hematology and Oncology

## 2014-06-23 VITALS — BP 135/95 | HR 73 | Temp 97.5°F | Resp 18 | Wt 219.6 lb

## 2014-06-23 DIAGNOSIS — C3410 Malignant neoplasm of upper lobe, unspecified bronchus or lung: Secondary | ICD-10-CM

## 2014-06-23 DIAGNOSIS — C3490 Malignant neoplasm of unspecified part of unspecified bronchus or lung: Secondary | ICD-10-CM | POA: Insufficient documentation

## 2014-06-23 NOTE — Patient Instructions (Signed)
[facility] Discharge Instructions  RECOMMENDATIONS MADE BY THE CONSULTANT AND ANY TEST RESULTS WILL BE SENT TO YOUR REFERRING PHYSICIAN.  We will see you in 6 months for a doctors appointment and repeat labs.  Please keep you scheduled CT scan. Please call for any questions or concerns.   Thank you for choosing [facility] to provide your oncology and hematology care.  To afford each patient quality time with our providers, please arrive at least 15 minutes before your scheduled appointment time.  With your help, our goal is to use those 15 minutes to complete the necessary work-up to ensure our physicians have the information they need to help with your evaluation and healthcare recommendations.    Effective January 1st, 2014, we ask that you re-schedule your appointment with our physicians should you arrive 10 or more minutes late for your appointment.  We strive to give you quality time with our providers, and arriving late affects you and other patients whose appointments are after yours.    Again, thank you for choosing [facility].  Our hope is that these requests will decrease the amount of time that you wait before being seen by our physicians.       _____________________________________________________________  Should you have questions after your visit to [facility], please contact our office at [phone] between the hours of 8:30 a.m. and 4:30 p.m.  Voicemails left after 4:30 p.m. will not be returned until the following business day.  For prescription refill requests, have your pharmacy contact our office with your prescription refill request.    _______________________________________________________________  We hope that we have given you very good care.  You may receive a patient satisfaction survey in the mail, please complete it and return it as soon as possible.  We value your  feedback!  _______________________________________________________________  Have you asked about our STAR program?  STAR stands for Survivorship Training and Rehabilitation, and this is a nationally recognized cancer care program that focuses on survivorship and rehabilitation.  Cancer and cancer treatments may cause problems, such as, pain, making you feel tired and keeping you from doing the things that you need or want to do. Cancer rehabilitation can help. Our goal is to reduce these troubling effects and help you have the best quality of life possible.  You may receive a survey from a nurse that asks questions about your current state of health.  Based on the survey results, all eligible patients will be referred to the [facility] program for an evaluation so we can better serve you!  A frequently asked questions sheet is available upon request.

## 2014-06-23 NOTE — Progress Notes (Signed)
[facility]  OFFICE PROGRESS [name], MD [address] [place] [address]  DIAGNOSIS: Malignant neoplasm of upper lobe of lung, unspecified laterality - Plan: CBC with Differential, Comprehensive metabolic panel, Lactate dehydrogenase  No chief complaint on file.   CURRENT THERAPY: Watchful expectation and surveillance  INTERVAL HISTORY: Jeremy Lopez 63 y.o. male returns for followup of small cell lung cancer diagnosed in August of 2008 and treated with chemotherapy, consolidative chest radiotherapy and prophylactic whole brain radiation with chemotherapy completed on 10/17/2007. Last CT scan followup in October 2014 showed no evidence of disease.  He continues to do well with occasional cough but no episodes of bronchitis since his last visit. Appetite and good with no nausea, vomiting, lower extremity swelling or redness, peripheral paresthesias, headache, focal weakness, nausea, vomiting, diarrhea, constipation, melena, hematochezia, hematuria, urinary incontinence, skin rash, headache, or seizures.    MEDICAL HISTORY: Past Medical History  Diagnosis Date  . ASCVD (arteriosclerotic cardiovascular disease)      DES placed for 95% proximal LAD stenosis in 07/2009; scattered 20-40% lesions; normal EF  . Carcinoma     RIGHT UPPER LOBE; SMALL CELL-2008; no evidence of disease since treatment ended in 11/2007  . Hypertension   . Hyperlipidemia     INTERIM HISTORY: has CARCINOMA, RIGHT LUNG, SMALL CELL, UPPER LOBE; HYPERLIPIDEMIA; HYPERTENSION; and ATHEROSCLEROTIC CARDIOVASCULAR DISEASE on his problem list.   Small cell lung cancer, limited stage disease, diagnosed in August 2008 status post 6 cycles of chemotherapy with cis-platinum, VP-16, and CCNU alternating with carboplatinum and VP-16 and Adriamycin.  She also have involved field radiation to the chest during cycles #5 and #6. He then had prophylactic cranial  irradiation in his last chemotherapy was on 10/17/2007.   No history exists.    ALLERGIES:  is allergic to cephalexin.  MEDICATIONS: has a current medication list which includes the following prescription(s): aspirin, diltiazem, fluticasone-salmeterol, metoprolol succinate, and rosuvastatin.  SURGICAL HISTORY:  Past Surgical History  Procedure Laterality Date  . Cardiac catheterization   06/30/09     (endeaver 2.5m x 184mdrug eluting stent)  . Right upper lobe small cell carcinoma      FAMILY HISTORY: family history is not on file.  SOCIAL HISTORY:  reports that he has quit smoking. He has never used smokeless tobacco. He reports that he drinks alcohol. He reports that he does not use illicit drugs.  REVIEW OF SYSTEMS:  Other than that discussed above is noncontributory.  PHYSICAL EXAMINATION: ECOG PERFORMANCE STATUS: 0 - Asymptomatic  There were no vitals taken for this visit.  GENERAL:alert, no distress and comfortable. Male pattern baldness. SKIN: skin color, texture, turgor are normal, no rashes or significant lesions EYES: PERLA; Conjunctiva are pink and non-injected, sclera clear SINUSES: No redness or tenderness over maxillary or ethmoid sinuses OROPHARYNX:no exudate, no erythema on lips, buccal mucosa, or tongue. NECK: supple, thyroid normal size, non-tender, without nodularity. No masses CHEST: Increased AP diameter with no breast masses. LYMPH:  no palpable lymphadenopathy in the cervical, axillary or inguinal LUNGS: clear to auscultation and percussion with normal breathing effort HEART: regular rate & rhythm and no murmurs. ABDOMEN:abdomen soft, non-tender and normal bowel sounds MUSCULOSKELETAL:no cyanosis of digits and no clubbing. Range of motion normal.  NEURO: alert & oriented x 3 with fluent speech, no focal motor/sensory deficits   LABORATORY DATA: Lab on 06/01/2014  Component Date Value Ref Range Status  .  WBC 06/01/2014 6.7  4.0 - 10.5 K/uL Final  .  RBC 06/01/2014 4.52  4.22 - 5.81 MIL/uL Final  . Hemoglobin 06/01/2014 13.8  13.0 - 17.0 g/dL Final  . HCT 06/01/2014 40.9  39.0 - 52.0 % Final  . MCV 06/01/2014 90.5  78.0 - 100.0 fL Final  . MCH 06/01/2014 30.5  26.0 - 34.0 pg Final  . MCHC 06/01/2014 33.7  30.0 - 36.0 g/dL Final  . RDW 06/01/2014 14.1  11.5 - 15.5 % Final  . Platelets 06/01/2014 316  150 - 400 K/uL Final  . Neutrophils Relative % 06/01/2014 72  43 - 77 % Final  . Neutro Abs 06/01/2014 4.9  1.7 - 7.7 K/uL Final  . Lymphocytes Relative 06/01/2014 17  12 - 46 % Final  . Lymphs Abs 06/01/2014 1.1  0.7 - 4.0 K/uL Final  . Monocytes Relative 06/01/2014 9  3 - 12 % Final  . Monocytes Absolute 06/01/2014 0.6  0.1 - 1.0 K/uL Final  . Eosinophils Relative 06/01/2014 1  0 - 5 % Final  . Eosinophils Absolute 06/01/2014 0.1  0.0 - 0.7 K/uL Final  . Basophils Relative 06/01/2014 1  0 - 1 % Final  . Basophils Absolute 06/01/2014 0.0  0.0 - 0.1 K/uL Final  . Sodium 06/01/2014 139  137 - 147 mEq/L Final  . Potassium 06/01/2014 3.7  3.7 - 5.3 mEq/L Final  . Chloride 06/01/2014 102  96 - 112 mEq/L Final  . CO2 06/01/2014 24  19 - 32 mEq/L Final  . Glucose, Bld 06/01/2014 118* 70 - 99 mg/dL Final  . BUN 06/01/2014 19  6 - 23 mg/dL Final  . Creatinine, Ser 06/01/2014 1.07  0.50 - 1.35 mg/dL Final  . Calcium 06/01/2014 8.8  8.4 - 10.5 mg/dL Final  . Total Protein 06/01/2014 6.8  6.0 - 8.3 g/dL Final  . Albumin 06/01/2014 3.9  3.5 - 5.2 g/dL Final  . AST 06/01/2014 13  0 - 37 U/L Final  . ALT 06/01/2014 16  0 - 53 U/L Final  . Alkaline Phosphatase 06/01/2014 73  39 - 117 U/L Final  . Total Bilirubin 06/01/2014 0.3  0.3 - 1.2 mg/dL Final  . GFR calc non Af Amer 06/01/2014 72* >90 mL/min Final  . GFR calc Af Amer 06/01/2014 83* >90 mL/min Final   Comment: (NOTE)                          The eGFR has been calculated using the CKD EPI equation.                          This calculation has not been validated in all clinical situations.                            eGFR's persistently <90 mL/min signify possible Chronic Kidney                          Disease.  . Anion gap 06/01/2014 13  5 - 15 Final  . LDH 06/01/2014 154  94 - 250 U/L Final    PATHOLOGY: No new pathology.  Urinalysis No results found for this basename: colorurine, appearanceur, labspec, phurine, glucoseu, hgbur, bilirubinur, ketonesur, proteinur, urobilinogen, nitrite, leukocytesur    RADIOGRAPHIC STUDIES: CT scan scheduled for 07/31/2014  ASSESSMENT:  #1. Limited stage  small cell lung cancer, no evidence of disease.  #2. Chronic obstructive pulmonary disease, stable, nonsmoking since age 56.  #3. Coronary artery disease, status post stenting with no evidence of heart failure or dysrhythmia. Stent was placed in September 2000.       PLAN:  #1. Continue current medications. #2. Repeat CT scan chest abdomen and pelvis with contrast on 07/31/2014. #3. Followup in 6 months with CBC, chem profile, LDH. #4. 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   All questions were answered. The patient knows to call the clinic with any problems, questions or concerns. We can certainly see the patient much sooner if necessary.   I spent 25 minutes counseling the patient face to face. The total time spent in the appointment was 30 minutes.    Doroteo Bradford, MD 06/23/2014 11:37 AM  DISCLAIMER:  This note was dictated with voice recognition software.  Similar sounding words can inadvertently be transcribed inaccurately and may not be corrected upon review.

## 2014-08-02 ENCOUNTER — Ambulatory Visit (HOSPITAL_COMMUNITY)
Admission: RE | Admit: 2014-08-02 | Discharge: 2014-08-02 | Disposition: A | Discharge status: Home / Self Care | Payer: BC Managed Care – PPO | Source: Ambulatory Visit | Attending: Hematology and Oncology | Admitting: Hematology and Oncology

## 2014-08-02 DIAGNOSIS — C3490 Malignant neoplasm of unspecified part of unspecified bronchus or lung: Secondary | ICD-10-CM | POA: Diagnosis not present

## 2014-08-02 DIAGNOSIS — R911 Solitary pulmonary nodule: Secondary | ICD-10-CM | POA: Insufficient documentation

## 2014-08-02 LAB — POCT I-STAT CREATININE: CREATININE: 1.2 mg/dL (ref 0.50–1.35)

## 2014-08-02 MED ORDER — IOHEXOL 300 MG/ML  SOLN
100.0000 mL | Freq: Once | INTRAMUSCULAR | Status: AC | PRN
  Administered 2014-08-02: 100 mL via INTRAVENOUS

## 2014-08-03 ENCOUNTER — Telehealth (HOSPITAL_COMMUNITY): Payer: Self-pay | Admitting: Hematology and Oncology

## 2014-08-03 ENCOUNTER — Telehealth (HOSPITAL_COMMUNITY): Payer: Self-pay

## 2014-08-03 NOTE — Telephone Encounter (Signed)
Call from patient requesting results of CT Scans done on 08/02/14.  Can be reached at [phone] or [phone].

## 2014-08-03 NOTE — Telephone Encounter (Signed)
Spoke to the patient on the telephone at 4:30 PM. Told him there was no evidence of cancer on his CAT scans. 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

## 2014-08-30 ENCOUNTER — Other Ambulatory Visit: Payer: Self-pay | Admitting: Adult Health

## 2014-10-25 ENCOUNTER — Encounter: Payer: Self-pay | Admitting: Cardiology

## 2014-10-25 ENCOUNTER — Ambulatory Visit (INDEPENDENT_AMBULATORY_CARE_PROVIDER_SITE_OTHER): Payer: BC Managed Care – PPO | Admitting: Cardiology

## 2014-10-25 VITALS — BP 148/80 | HR 78 | Ht 70.0 in | Wt 222.0 lb

## 2014-10-25 DIAGNOSIS — Z01818 Encounter for other preprocedural examination: Secondary | ICD-10-CM

## 2014-10-25 DIAGNOSIS — E782 Mixed hyperlipidemia: Secondary | ICD-10-CM

## 2014-10-25 DIAGNOSIS — I2510 Atherosclerotic heart disease of native coronary artery without angina pectoris: Secondary | ICD-10-CM

## 2014-10-25 DIAGNOSIS — R040 Epistaxis: Secondary | ICD-10-CM

## 2014-10-25 NOTE — Patient Instructions (Addendum)
Your physician recommends that you schedule a follow-up appointment in: in March with Dr.Ross after stress test    Your physician has requested that you have an exercise tolerance test. For further information please visit [email or web address]. Please also follow instruction sheet, as given.   PLEASE HOLD CARDIZEM AND TOPROL THE AM OF TEST  SEE ENT IF NOSE BLEEDS CONTINUE    Thank you for choosing [facility] !

## 2014-10-25 NOTE — Assessment & Plan Note (Signed)
Expect that this might be related to dry air during long flights with desiccation of nasal mucosa. He is on aspirin 81 mg daily for CAD, and this will continue. Recommended conservative measures such as saline nasal spray or perhaps even Vaseline. If these measures do not help, evaluation by ENT would be recommended.

## 2014-10-25 NOTE — Progress Notes (Signed)
Reason for visit: CAD, hyperlipidemia, nosebleeds  Clinical Summary Jeremy Lopez is a 64 y.o.male patient of Dr. Dorris Carnes, last seen in April 2015. I was asked by Ms. Lawrence NP to see the patient for a routine follow-up visit today. He comes in stating that he has had recent trouble with recurring nosebleeds, usually on long flights. He is a Presenter, broadcasting. States that he is able to control the bleeding each time. He is on aspirin 81 mg daily with history of CAD, otherwise no antiplatelet or anticoagulant agents. At other times he does not seem to have trouble with nosebleeds. He has not seen Dr. Willey Blade about this, has not had an ENT evaluation.  From a cardiac perspective, he reports no angina symptoms, NYHA class II dyspnea which is stable. Continues on the cardiac regimen outlined below.  GXT in March 2015 was negative for ischemia at maximum workload 10.1 METs, no chest pain reported, low risk Duke treadmill score of 8. Will be due for a follow-up GXT in March of this year as part of his FFA evaluation. He does tell me that he will likely be retiring within the next year.  Lipid panel from May 2015 showed cholesterol 134, triglycerides 121, HDL 46, and LDL 64. He continues on Crestor.   Allergies  Allergen Reactions  . Cephalexin     REACTION: throat swelling    Current Outpatient Prescriptions  Medication Sig Dispense Refill  . aspirin 81 MG tablet Take 81 mg by mouth daily.     . CRESTOR 40 MG tablet TAKE 1 TABLET EVERY DAY 90 tablet 1  . diltiazem (CARDIZEM CD) 180 MG 24 hr capsule Take 1 capsule (180 mg total) by mouth daily. 90 capsule 3  . metoprolol succinate (TOPROL-XL) 50 MG 24 hr tablet TAKE 1 TABLET EVERY DAY 30 tablet 11   No current facility-administered medications for this visit.    Past Medical History  Diagnosis Date  . CAD (coronary artery disease), native coronary artery     DES proximal LAD 07/2009; scattered 20-40% lesions; normal LVEF  . Small  cell lung cancer     Right upper lobe 2008; no evidence of disease since treatment ended in 11/2007  . Essential hypertension   . Hyperlipidemia     Social History Jeremy Lopez reports that he quit smoking about 16 years ago. His smoking use included Cigarettes. He started smoking about 48 years ago. He has never used smokeless tobacco. Jeremy Lopez reports that he drinks alcohol.  Review of Systems Complete review of systems negative except as otherwise outlined in the clinical summary and also the following. No dizziness or syncope. No orthopnea or PND. A palpitations.  Physical Examination Filed Vitals:   10/25/14 1328  BP: 148/80  Pulse: 78   Filed Weights   10/25/14 1328  Weight: 222 lb (100.699 kg)   Overweight male, appears comfortable. HEENT: Conjunctiva and lids normal, oropharynx clear. Neck: Supple, no elevated JVP or carotid bruits, no thyromegaly. Lungs: Clear to auscultation, nonlabored breathing at rest. Cardiac: Regular rate and rhythm, no S3 or significant systolic murmur, no pericardial rub. Abdomen: Soft, nontender, bowel sounds present, no guarding or rebound. Extremities: No pitting edema, distal pulses 2+. Skin: Warm and dry. Musculoskeletal: No kyphosis. Neuropsychiatric: Alert and oriented x3, affect grossly appropriate.   Problem List and Plan   CAD (coronary artery disease), native coronary artery Symptomatically stable on medical therapy including aspirin, Toprol-XL, Cardizem CD, and Crestor. Will arrange a follow-up GXT  for evaluation of chronic CAD per FAA requirements, in March of this year. Office visit with Dr. Harrington Challenger to review.   Nosebleed Expect that this might be related to dry air during long flights with desiccation of nasal mucosa. He is on aspirin 81 mg daily for CAD, and this will continue. Recommended conservative measures such as saline nasal spray or perhaps even Vaseline. If these measures do not help, evaluation by ENT would be  recommended.   Mixed hyperlipidemia On Crestor, lab work followed by Dr. Willey Blade. Last LDL was 64.     Satira Sark, M.D., F.A.C.C.

## 2014-10-25 NOTE — Assessment & Plan Note (Signed)
Symptomatically stable on medical therapy including aspirin, Toprol-XL, Cardizem CD, and Crestor. 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 Office visit with Dr. Harrington Challenger to review.

## 2014-10-25 NOTE — Progress Notes (Deleted)
    HPI: Jeremy Lopez is a 64 year old patient of Dr. Harrington Challenger that we follow for ongoing assessment and management of CAD, drug-eluting stent to the LAD in 2010, with annual followup for [place] pilot certification period.most recent nuclear medicine study was completed in 2014: IMPRESSION: Negative exercise Myoview for ischemia, low risk study. There were no diagnostic ST-segment abnormalities, maximum workload 10.1 METS without chest pain. Hypertensive response noted. There is diaphragmatic attenuation without ischemic perfusion defects. LVEF is 67%.  Allergies  Allergen Reactions  . Cephalexin     REACTION: throat swelling    Current Outpatient Prescriptions  Medication Sig Dispense Refill  . aspirin 81 MG tablet Take 81 mg by mouth daily.     . CRESTOR 40 MG tablet TAKE 1 TABLET EVERY DAY 90 tablet 1  . diltiazem (CARDIZEM CD) 180 MG 24 hr capsule Take 1 capsule (180 mg total) by mouth daily. 90 capsule 3  . metoprolol succinate (TOPROL-XL) 50 MG 24 hr tablet TAKE 1 TABLET EVERY DAY 30 tablet 11   No current facility-administered medications for this visit.    Past Medical History  Diagnosis Date  . ASCVD (arteriosclerotic cardiovascular disease)      DES placed for 95% proximal LAD stenosis in 07/2009; scattered 20-40% lesions; normal EF  . Carcinoma     RIGHT UPPER LOBE; SMALL CELL-2008; no evidence of disease since treatment ended in 11/2007  . Hypertension   . Hyperlipidemia     Past Surgical History  Procedure Laterality Date  . Cardiac catheterization   06/30/09     (endeaver 2.63mm x 28mm drug eluting stent)  . Right upper lobe small cell carcinoma      ROS:  *** PHYSICAL EXAM There were no vitals taken for this visit. ***  EKG:  ***  ASSESSMENT AND PLAN

## 2014-10-25 NOTE — Assessment & Plan Note (Signed)
On Crestor, lab work followed by Dr. Willey Blade. Last LDL was 64.

## 2014-12-17 ENCOUNTER — Other Ambulatory Visit (HOSPITAL_COMMUNITY): Payer: Self-pay

## 2014-12-17 DIAGNOSIS — C3490 Malignant neoplasm of unspecified part of unspecified bronchus or lung: Secondary | ICD-10-CM

## 2014-12-22 ENCOUNTER — Other Ambulatory Visit (HOSPITAL_COMMUNITY): Payer: BC Managed Care – PPO

## 2014-12-22 ENCOUNTER — Ambulatory Visit (HOSPITAL_COMMUNITY): Payer: BC Managed Care – PPO | Admitting: Hematology & Oncology

## 2014-12-23 ENCOUNTER — Encounter (HOSPITAL_COMMUNITY): Payer: BC Managed Care – PPO

## 2014-12-24 ENCOUNTER — Ambulatory Visit (HOSPITAL_COMMUNITY)
Admission: RE | Admit: 2014-12-24 | Discharge: 2014-12-24 | Disposition: A | Discharge status: Home / Self Care | Payer: BC Managed Care – PPO | Source: Ambulatory Visit | Attending: Cardiology | Admitting: Cardiology

## 2014-12-24 ENCOUNTER — Encounter (HOSPITAL_COMMUNITY): Payer: Self-pay | Admitting: Cardiovascular Disease

## 2014-12-24 DIAGNOSIS — I2510 Atherosclerotic heart disease of native coronary artery without angina pectoris: Secondary | ICD-10-CM

## 2014-12-24 NOTE — Progress Notes (Addendum)
Stress Lab Nurses Notes - Drevon Plog 12/24/2014 Reason for doing test: CAD and Physcial Type of test: Regular GTX Nurse performing test: Gerrit Halls, RN Nuclear Medicine Tech: Not Applicable Echo Tech: Not Applicable MD performing test: Koneswaran/K.Purcell Nails NP Family MD: Willey Blade Test explained and consent signed: Yes.   IV started: No IV started Symptoms: Fatigue in legs & SOB Treatment/Intervention: None Reason test stopped: reached target HR After recovery IV was: NA Patient to return to Nuc. Med at : NA Patient discharged: Home Patient's Condition upon discharge was: stable Comments: During test peak BP 190/86 & HR 160. Recovery BP 149/86 & HR 99.  Symptoms resolved in recovery.  Geanie Cooley T   ATTENDING PHYSICIAN ADDENDUM: The patient was stressed according to the Bruce protocol for 8:21, achieving 10.1 METS, achieving 101% of the maximal, age-predicted heart rate. No chest pain was reported. Resting ECG demonstrated normal sinus rhythm, with no ischemic ST-T abnormalities with stress, nor any arrhythmias. Duke treadmill score of 8.5 predicts a low risk of major cardiac events.

## 2014-12-30 ENCOUNTER — Encounter (HOSPITAL_COMMUNITY): Payer: Self-pay

## 2015-01-31 ENCOUNTER — Ambulatory Visit (INDEPENDENT_AMBULATORY_CARE_PROVIDER_SITE_OTHER): Payer: BLUE CROSS/BLUE SHIELD | Admitting: Adult Health

## 2015-01-31 ENCOUNTER — Encounter: Payer: Self-pay | Admitting: Adult Health

## 2015-01-31 VITALS — BP 118/76 | HR 77 | Ht 70.0 in | Wt 222.0 lb

## 2015-01-31 DIAGNOSIS — I10 Essential (primary) hypertension: Secondary | ICD-10-CM

## 2015-01-31 DIAGNOSIS — I2510 Atherosclerotic heart disease of native coronary artery without angina pectoris: Secondary | ICD-10-CM

## 2015-01-31 DIAGNOSIS — E785 Hyperlipidemia, unspecified: Secondary | ICD-10-CM

## 2015-01-31 MED ORDER — ROSUVASTATIN CALCIUM 40 MG PO TABS: 40.0000 mg | ORAL_TABLET | Freq: Every day | ORAL | Status: DC

## 2015-01-31 NOTE — Progress Notes (Deleted)
Name: Jeremy Lopez    DOB: Apr 05, 1951  Age: 64 y.o.  MR#: [id number]       PCP:  Asencion Noble, MD      Insurance: Payor: [place] / Plan: [place] / Product Type: *No Product type* /   CC:    Chief Complaint  Patient presents with  . Coronary Artery Disease  . Hyperlipidemia    VS Filed Vitals:   01/31/15 1524  BP: 118/76  Pulse: 77  Height: '5\' 10"'$  (1.778 m)  Weight: 222 lb (100.699 kg)    Weights Current Weight  01/31/15 222 lb (100.699 kg)  10/25/14 222 lb (100.699 kg)  06/23/14 219 lb 9.6 oz (99.61 kg)    Blood Pressure  BP Readings from Last 3 Encounters:  01/31/15 118/76  10/25/14 148/80  06/23/14 135/95     Admit date:  (Not on file) Last encounter with RMR:  08/30/2014   Allergy Cephalexin  Current Outpatient Prescriptions  Medication Sig Dispense Refill  . aspirin 81 MG tablet Take 81 mg by mouth daily.     . CRESTOR 40 MG tablet TAKE 1 TABLET EVERY DAY 90 tablet 1  . diltiazem (CARDIZEM CD) 180 MG 24 hr capsule Take 1 capsule (180 mg total) by mouth daily. 90 capsule 3  . metoprolol succinate (TOPROL-XL) 50 MG 24 hr tablet TAKE 1 TABLET EVERY DAY 30 tablet 11   No current facility-administered medications for this visit.    Discontinued Meds:   There are no discontinued medications.  Patient Active Problem List   Diagnosis Date Noted  . Nosebleed 10/25/2014  . Lung cancer 02/05/2010  . CAD (coronary artery disease), native coronary artery 02/05/2010  . Mixed hyperlipidemia 08/16/2009  . Essential hypertension 07/08/2009    LABS    Component Value Date/Time   NA 139 06/01/2014 1310   NA 138 02/08/2014 1028   NA 141 12/02/2013 1147   K 3.7 06/01/2014 1310   K 4.5 02/08/2014 1028   K 4.5 12/02/2013 1147   CL 102 06/01/2014 1310   CL 103 02/08/2014 1028   CL 103 12/02/2013 1147   CO2 24 06/01/2014 1310   CO2 25 02/08/2014 1028   CO2 26 12/02/2013 1147   GLUCOSE 118* 06/01/2014 1310   GLUCOSE 93 02/08/2014 1028   GLUCOSE 91  12/02/2013 1147   BUN 19 06/01/2014 1310   BUN 19 02/08/2014 1028   BUN 14 12/02/2013 1147   CREATININE 1.20 08/02/2014 0951   CREATININE 1.07 06/01/2014 1310   CREATININE 1.09 02/08/2014 1028   CREATININE 1.02 12/02/2013 1147   CREATININE 1.09 02/02/2013 1210   CALCIUM 8.8 06/01/2014 1310   CALCIUM 9.5 02/08/2014 1028   CALCIUM 9.3 12/02/2013 1147   GFRNONAA 72* 06/01/2014 1310   GFRNONAA 77* 12/02/2013 1147   GFRNONAA 64* 07/20/2013 1129   GFRAA 83* 06/01/2014 1310   GFRAA 89* 12/02/2013 1147   GFRAA 75* 07/20/2013 1129   CMP     Component Value Date/Time   NA 139 06/01/2014 1310   K 3.7 06/01/2014 1310   CL 102 06/01/2014 1310   CO2 24 06/01/2014 1310   GLUCOSE 118* 06/01/2014 1310   BUN 19 06/01/2014 1310   CREATININE 1.20 08/02/2014 0951   CREATININE 1.09 02/08/2014 1028   CALCIUM 8.8 06/01/2014 1310   PROT 6.8 06/01/2014 1310   ALBUMIN 3.9 06/01/2014 1310   AST 13 06/01/2014 1310   ALT 16 06/01/2014 1310   ALKPHOS 73 06/01/2014 1310  BILITOT 0.3 06/01/2014 1310   GFRNONAA 72* 06/01/2014 1310   GFRAA 83* 06/01/2014 1310       Component Value Date/Time   WBC 6.7 06/01/2014 1310   WBC 6.5 02/08/2014 1028   WBC 7.4 12/02/2013 1147   WBC 11.6* 10/14/2007 1541   WBC 7.7 10/07/2007 1537   WBC 6.9 09/30/2007 1544   HGB 13.8 06/01/2014 1310   HGB 14.4 02/08/2014 1028   HGB 14.6 12/02/2013 1147   HGB 11.8* 10/14/2007 1541   HGB 11.8* 10/07/2007 1537   HGB 10.1* 09/30/2007 1544   HCT 40.9 06/01/2014 1310   HCT 42.0 02/08/2014 1028   HCT 43.3 12/02/2013 1147   HCT 35.2* 10/14/2007 1541   HCT 35.1* 10/07/2007 1537   HCT 29.1* 09/30/2007 1544   MCV 90.5 06/01/2014 1310   MCV 87.7 02/08/2014 1028   MCV 91.4 12/02/2013 1147   MCV 96.4 10/14/2007 1541   MCV 96.4 10/07/2007 1537   MCV 93.0 09/30/2007 1544    Lipid Panel     Component Value Date/Time   CHOL 134 02/08/2014 1028   TRIG 121 02/08/2014 1028   HDL 46 02/08/2014 1028   CHOLHDL 2.9 02/08/2014  1028   VLDL 24 02/08/2014 1028   LDLCALC 64 02/08/2014 1028    ABG    Component Value Date/Time   PHART 7.411 09/22/2010 0910   PCO2ART 36.8 09/22/2010 0910   PO2ART 96.7 09/22/2010 0910   HCO3 22.9 09/22/2010 0910   TCO2 25 01/19/2013 1100   ACIDBASEDEF 1.0 09/22/2010 0910   O2SAT 97.6 09/22/2010 0910     Lab Results  Component Value Date   TSH 3.233 01/20/2013   BNP (last 3 results) No results for input(s): BNP in the last 8760 hours.  ProBNP (last 3 results) No results for input(s): PROBNP in the last 8760 hours.  Cardiac Panel (last 3 results) No results for input(s): CKTOTAL, CKMB, TROPONINI, RELINDX in the last 72 hours.  Iron/TIBC/Ferritin/ %Sat No results found for: IRON, TIBC, FERRITIN, IRONPCTSAT   EKG Orders placed or performed in visit on 01/31/15  . EKG 12-Lead     Prior Assessment and Plan Problem List as of 01/31/2015      Cardiovascular and Mediastinum   Essential hypertension   Last Assessment & Plan 01/26/2013 Office Visit Written 01/26/2013  4:02 PM by Lendon Colonel, NP    Excellent control of blood pressure at time of this visit. We will continue him on his current medication regimen without changes.       CAD (coronary artery disease), native coronary artery   Last Assessment & Plan 10/25/2014 Office Visit Written 10/25/2014  1:54 PM by Satira Sark, MD    Symptomatically stable on medical therapy including aspirin, Toprol-XL, Cardizem CD, and Crestor. 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 Office visit with Dr. Harrington Challenger to review.        Respiratory   Lung cancer   Nosebleed   Last Assessment & Plan 10/25/2014 Office Visit Written 10/25/2014  1:56 PM by Satira Sark, MD    Expect that this might be related to dry air during long flights with desiccation of nasal mucosa. He is on aspirin 81 mg daily for CAD, and this will continue. Recommended conservative measures such as  saline nasal spray or perhaps even Vaseline. If these measures do not help, evaluation by ENT would be recommended.  Other   Mixed hyperlipidemia   Last Assessment & Plan 10/25/2014 Office Visit Written 10/25/2014  1:56 PM by Satira Sark, MD    On Crestor, lab work followed by Dr. Willey Blade. Last LDL was 64.          Imaging: No results found.

## 2015-01-31 NOTE — Progress Notes (Signed)
Cardiology Office Note   Date:  01/31/2015   ID:  Jeremy Lopez, DOB 03-Nov-1950, MRN [id number]  PCP:  Asencion Noble, MD  Cardiologist:  Ross/ Jory Sims, NP   Chief Complaint  Patient presents with  . Coronary Artery Disease  . Hyperlipidemia      History of Present Illness: Jeremy Lopez is a 64 y.o. 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  Stress test revealed normal, without evidence of ischemia.  He is here without complaint to discuss test results.  He is in need of labs within 30 days.  He is planning on retiring in September of 2016.  He is without any symptoms of chest pain, dyspnea, or lower extremity edema.  He is medically compliant.    Past Medical History  Diagnosis Date  . CAD (coronary artery disease), native coronary artery     DES proximal LAD 07/2009; scattered 20-40% lesions; normal LVEF  . Small cell lung cancer     Right upper lobe 2008; no evidence of disease since treatment ended in 11/2007  . Essential hypertension   . Hyperlipidemia     Past Surgical History  Procedure Laterality Date  . Video bronchoscopy and video mediastinoscopy  2008  . Insertion of left subclavian port-a-cath  2008     Current Outpatient Prescriptions  Medication Sig Dispense Refill  . aspirin 81 MG tablet Take 81 mg by mouth daily.     . CRESTOR 40 MG tablet TAKE 1 TABLET EVERY DAY 90 tablet 1  . diltiazem (CARDIZEM CD) 180 MG 24 hr capsule Take 1 capsule (180 mg total) by mouth daily. 90 capsule 3  . metoprolol succinate (TOPROL-XL) 50 MG 24 hr tablet TAKE 1 TABLET EVERY DAY 30 tablet 11   No current facility-administered medications for this visit.    Allergies:   Cephalexin    Social History:  The patient  reports that he quit smoking about 16 years ago. His smoking use included  Cigarettes. He started smoking about 48 years ago. He has never used smokeless tobacco. He reports that he drinks alcohol. He reports that he does not use illicit drugs.   Family History:  The patient's family history includes CAD in his brother; Cancer in his father; Hypertension in his brother.    ROS: .   All other systems are reviewed and negative.Unless otherwise mentioned in H&P above.   PHYSICAL EXAM: VS:  There were no vitals taken for this visit. , BMI There is no weight on file to calculate BMI. GEN: Well nourished, well developed, in no acute distress HEENT: normal Neck: no JVD, carotid bruits, or masses Cardiac: RRR; no murmurs, rubs, or gallops,no edema  Respiratory:  clear to auscultation bilaterally, normal work of breathing GI: soft, nontender, nondistended, + BS MS: no deformity or atrophy Skin: warm and dry, no rash Neuro:  Strength and sensation are intact Psych: euthymic mood, full affect   EKG: The ekg ordered today demonstrates normal sinus rhythm, rate of 77 beats per minute   Recent Labs: 06/01/2014: ALT 16; BUN 19; Hemoglobin 13.8; Platelets 316; Potassium 3.7; Sodium 139 08/02/2014: Creatinine 1.20    Lipid Panel    Component Value Date/Time   CHOL 134 02/08/2014 1028   TRIG 121 02/08/2014 1028   HDL 46 02/08/2014 1028   CHOLHDL 2.9 02/08/2014 1028  VLDL 24 02/08/2014 1028   LDLCALC 64 02/08/2014 1028      Wt Readings from Last 3 Encounters:  10/25/14 222 lb (100.699 kg)  06/23/14 219 lb 9.6 oz (99.61 kg)  01/08/14 220 lb (99.791 kg)      Other studies Reviewed: Additional studies/ records that were reviewed today include: Cardiolite stress test Review of the above records demonstrates: no evidence of ischemia or infarction.   ASSESSMENT AND PLAN:  1. CAD: He is currently stable from a cardiac standpoint.  He will continue on aspirin, metoprolol, diltiazem, and Crestor..Copy of stress test is provide to the patient so that he can send  it to the [facility]. He requires labs CMET,Lipids and CBC. This is ordered and he will be provided a copy of the results for [place] documentation  2. Hypertension: He is well controlled. Refills are provided on all medications. Will review labs when they become available.    Current medicines are reviewed at length with the patient today.    Labs/ tests ordered today include: BMET, Lipds, and CBC No orders of the defined types were placed in this encounter.     Disposition:   FU with 1 year.  Signed, Jory Sims, NP  01/31/2015 7:33 AM    [facility] [address] [place], [place] [address] Phone: [phone]; Fax: [phone]

## 2015-01-31 NOTE — Patient Instructions (Signed)
Your physician recommends that you schedule a follow-up appointment in: 1 year   Your physician recommends that you continue on your current medications as directed. Please refer to the Current Medication list given to you today.  Thank you for choosing [facility]!

## 2015-02-01 LAB — LIPID PANEL
Cholesterol: 98 mg/dL (ref 0–200)
HDL: 36 mg/dL — ABNORMAL LOW (ref >=40)
LDL Cholesterol: 43 mg/dL (ref 0–99)
Total CHOL/HDL Ratio: 2.7 Ratio
Triglycerides: 95 mg/dL (ref <150)
VLDL: 19 mg/dL (ref 0–40)

## 2015-02-01 LAB — CBC WITH DIFFERENTIAL/PLATELET
BASOS ABS: 0.1 10*3/uL (ref 0.0–0.1)
BASOS PCT: 1 % (ref 0–1)
Eosinophils Absolute: 0.2 10*3/uL (ref 0.0–0.7)
Eosinophils Relative: 2 % (ref 0–5)
HCT: 40.4 % (ref 39.0–52.0)
HEMOGLOBIN: 13.6 g/dL (ref 13.0–17.0)
Lymphocytes Relative: 17 % (ref 12–46)
Lymphs Abs: 1.5 10*3/uL (ref 0.7–4.0)
MCH: 30.2 pg (ref 26.0–34.0)
MCHC: 33.7 g/dL (ref 30.0–36.0)
MCV: 89.8 fL (ref 78.0–100.0)
MPV: 8.3 fL — AB (ref 8.6–12.4)
Monocytes Absolute: 0.8 10*3/uL (ref 0.1–1.0)
Monocytes Relative: 9 % (ref 3–12)
Neutro Abs: 6.1 10*3/uL (ref 1.7–7.7)
Neutrophils Relative %: 71 % (ref 43–77)
PLATELETS: 325 10*3/uL (ref 150–400)
RBC: 4.5 MIL/uL (ref 4.22–5.81)
RDW: 14 % (ref 11.5–15.5)
WBC: 8.6 10*3/uL (ref 4.0–10.5)

## 2015-02-01 LAB — COMPREHENSIVE METABOLIC PANEL
ALBUMIN: 4.2 g/dL (ref 3.5–5.2)
ALK PHOS: 64 U/L (ref 39–117)
ALT: 13 U/L (ref 0–53)
AST: 16 U/L (ref 0–37)
BUN: 15 mg/dL (ref 6–23)
CHLORIDE: 102 meq/L (ref 96–112)
CO2: 24 mEq/L (ref 19–32)
Calcium: 8.9 mg/dL (ref 8.4–10.5)
Creat: 0.98 mg/dL (ref 0.50–1.35)
Glucose, Bld: 74 mg/dL (ref 70–99)
Potassium: 4.7 mEq/L (ref 3.5–5.3)
SODIUM: 137 meq/L (ref 135–145)
Total Bilirubin: 0.5 mg/dL (ref 0.2–1.2)
Total Protein: 6.6 g/dL (ref 6.0–8.3)

## 2015-06-08 ENCOUNTER — Other Ambulatory Visit: Payer: Self-pay | Admitting: Adult Health

## 2015-08-03 ENCOUNTER — Encounter (INDEPENDENT_AMBULATORY_CARE_PROVIDER_SITE_OTHER): Payer: Self-pay | Admitting: Family Medicine

## 2015-08-03 VITALS — BP 135/72 | HR 65

## 2015-08-03 DIAGNOSIS — Z029 Encounter for administrative examinations, unspecified: Secondary | ICD-10-CM

## 2015-08-03 DIAGNOSIS — Z0289 Encounter for other administrative examinations: Secondary | ICD-10-CM

## 2015-08-03 NOTE — Progress Notes (Unsigned)
First class FAA flight physical

## 2015-12-20 ENCOUNTER — Ambulatory Visit (INDEPENDENT_AMBULATORY_CARE_PROVIDER_SITE_OTHER): Payer: BLUE CROSS/BLUE SHIELD | Admitting: Adult Health

## 2015-12-20 ENCOUNTER — Encounter: Payer: Self-pay | Admitting: Adult Health

## 2015-12-20 VITALS — BP 134/78 | HR 77 | Ht 70.0 in | Wt 207.0 lb

## 2015-12-20 DIAGNOSIS — I2510 Atherosclerotic heart disease of native coronary artery without angina pectoris: Secondary | ICD-10-CM | POA: Diagnosis not present

## 2015-12-20 NOTE — Progress Notes (Deleted)
Name: Jeremy Lopez    DOB: [birth year]  Age: 65 y.o.  MR#: [id number]       PCP:  Asencion Noble, MD      Insurance: Payor: [place] / Plan: [place] / Product Type: *No Product type* /   CC:   No chief complaint on file.   VS Filed Vitals:   12/20/15 1416  BP: 134/78  Pulse: 77  Height: '5\' 10"'$  (1.778 m)  Weight: 207 lb (93.895 kg)  SpO2: 96%    Weights Current Weight  12/20/15 207 lb (93.895 kg)  01/31/15 222 lb (100.699 kg)  10/25/14 222 lb (100.699 kg)    Blood Pressure  BP Readings from Last 3 Encounters:  12/20/15 134/78  08/03/15 135/72  01/31/15 118/76     Admit date:  (Not on file) Last encounter with RMR:  Visit date not found   Allergy Cephalexin  Current Outpatient Prescriptions  Medication Sig Dispense Refill  . aspirin 81 MG tablet Take 81 mg by mouth daily.     Marland Kitchen diltiazem (CARDIZEM CD) 180 MG 24 hr capsule Take 1 capsule (180 mg total) by mouth daily. 90 capsule 3  . metoprolol succinate (TOPROL-XL) 50 MG 24 hr tablet TAKE 1 TABLET EVERY DAY 30 tablet 11  . rosuvastatin (CRESTOR) 40 MG tablet Take 1 tablet (40 mg total) by mouth daily. 90 tablet 3   No current facility-administered medications for this visit.    Discontinued Meds:   There are no discontinued medications.  Patient Active Problem List   Diagnosis Date Noted  . Nosebleed 10/25/2014  . Lung cancer ([place]) 02/05/2010  . CAD (coronary artery disease), native coronary artery 02/05/2010  . Mixed hyperlipidemia 08/16/2009  . Essential hypertension 07/08/2009    LABS    Component Value Date/Time   NA 137 01/31/2015 1620   NA 139 06/01/2014 1310   NA 138 02/08/2014 1028   K 4.7 01/31/2015 1620   K 3.7 06/01/2014 1310   K 4.5 02/08/2014 1028   CL 102 01/31/2015 1620   CL 102 06/01/2014 1310   CL 103 02/08/2014 1028   CO2 24 01/31/2015 1620   CO2 24 06/01/2014 1310   CO2 25 02/08/2014 1028   GLUCOSE 74 01/31/2015 1620   GLUCOSE 118* 06/01/2014 1310   GLUCOSE 93  02/08/2014 1028   BUN 15 01/31/2015 1620   BUN 19 06/01/2014 1310   BUN 19 02/08/2014 1028   CREATININE 0.98 01/31/2015 1620   CREATININE 1.20 08/02/2014 0951   CREATININE 1.07 06/01/2014 1310   CREATININE 1.09 02/08/2014 1028   CREATININE 1.02 12/02/2013 1147   CREATININE 1.09 02/02/2013 1210   CALCIUM 8.9 01/31/2015 1620   CALCIUM 8.8 06/01/2014 1310   CALCIUM 9.5 02/08/2014 1028   GFRNONAA 72* 06/01/2014 1310   GFRNONAA 77* 12/02/2013 1147   GFRNONAA 64* 07/20/2013 1129   GFRAA 83* 06/01/2014 1310   GFRAA 89* 12/02/2013 1147   GFRAA 75* 07/20/2013 1129   CMP     Component Value Date/Time   NA 137 01/31/2015 1620   K 4.7 01/31/2015 1620   CL 102 01/31/2015 1620   CO2 24 01/31/2015 1620   GLUCOSE 74 01/31/2015 1620   BUN 15 01/31/2015 1620   CREATININE 0.98 01/31/2015 1620   CREATININE 1.20 08/02/2014 0951   CALCIUM 8.9 01/31/2015 1620   PROT 6.6 01/31/2015 1620   ALBUMIN 4.2 01/31/2015 1620   AST 16 01/31/2015 1620   ALT 13 01/31/2015 1620   ALKPHOS  64 01/31/2015 1620   BILITOT 0.5 01/31/2015 1620   GFRNONAA 72* 06/01/2014 1310   GFRAA 83* 06/01/2014 1310       Component Value Date/Time   WBC 8.6 01/31/2015 1620   WBC 6.7 06/01/2014 1310   WBC 6.5 02/08/2014 1028   WBC 11.6* 10/14/2007 1541   WBC 7.7 10/07/2007 1537   WBC 6.9 09/30/2007 1544   HGB 13.6 01/31/2015 1620   HGB 13.8 06/01/2014 1310   HGB 14.4 02/08/2014 1028   HGB 11.8* 10/14/2007 1541   HGB 11.8* 10/07/2007 1537   HGB 10.1* 09/30/2007 1544   HCT 40.4 01/31/2015 1620   HCT 40.9 06/01/2014 1310   HCT 42.0 02/08/2014 1028   HCT 35.2* 10/14/2007 1541   HCT 35.1* 10/07/2007 1537   HCT 29.1* 09/30/2007 1544   MCV 89.8 01/31/2015 1620   MCV 90.5 06/01/2014 1310   MCV 87.7 02/08/2014 1028   MCV 96.4 10/14/2007 1541   MCV 96.4 10/07/2007 1537   MCV 93.0 09/30/2007 1544    Lipid Panel     Component Value Date/Time   CHOL 98 01/31/2015 1620   TRIG 95 01/31/2015 1620   HDL 36* 01/31/2015  1620   CHOLHDL 2.7 01/31/2015 1620   VLDL 19 01/31/2015 1620   LDLCALC 43 01/31/2015 1620    ABG    Component Value Date/Time   PHART 7.411 09/22/2010 0910   PCO2ART 36.8 09/22/2010 0910   PO2ART 96.7 09/22/2010 0910   HCO3 22.9 09/22/2010 0910   TCO2 25 01/19/2013 1100   ACIDBASEDEF 1.0 09/22/2010 0910   O2SAT 97.6 09/22/2010 0910     Lab Results  Component Value Date   TSH 3.233 01/20/2013   BNP (last 3 results) No results for input(s): BNP in the last 8760 hours.  ProBNP (last 3 results) No results for input(s): PROBNP in the last 8760 hours.  Cardiac Panel (last 3 results) No results for input(s): CKTOTAL, CKMB, TROPONINI, RELINDX in the last 72 hours.  Iron/TIBC/Ferritin/ %Sat No results found for: IRON, TIBC, FERRITIN, IRONPCTSAT   EKG Orders placed or performed in visit on 08/03/15  . EKG 12-Lead     Prior Assessment and Plan Problem List as of 12/20/2015      Cardiovascular and Mediastinum   Essential hypertension   Last Assessment & Plan 01/26/2013 Office Visit Written 01/26/2013  4:02 PM by Lendon Colonel, NP    Excellent control of blood pressure at time of this visit. We will continue him on his current medication regimen without changes.       CAD (coronary artery disease), native coronary artery   Last Assessment & Plan 10/25/2014 Office Visit Written 10/25/2014  1:54 PM by Satira Sark, MD    Symptomatically stable on medical therapy including aspirin, Toprol-XL, Cardizem CD, and Crestor. 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 Office visit with Dr. Harrington Challenger to review.        Respiratory   Lung cancer [facility])   Nosebleed   Last Assessment & Plan 10/25/2014 Office Visit Written 10/25/2014  1:56 PM by Satira Sark, MD    Expect that this might be related to dry air during long flights with desiccation of nasal mucosa. He is on aspirin 81 mg daily for CAD, and this will continue.  Recommended conservative measures such as saline nasal spray or perhaps even Vaseline. If these measures do not help, evaluation by ENT would be recommended.  Other   Mixed hyperlipidemia   Last Assessment & Plan 10/25/2014 Office Visit Written 10/25/2014  1:56 PM by Satira Sark, MD    On Crestor, lab work followed by Dr. Willey Blade. Last LDL was 64.          Imaging: No results found.

## 2015-12-20 NOTE — Progress Notes (Signed)
Cardiology Office Note   Date:  12/20/2015   ID:  Jeremy Lopez, DOB 07-Feb-1951, MRN [id number]  PCP:  Asencion Noble, MD  Cardiologist:  Dr. Harrington Challenger Jory Sims, NP   Chief Complaint  Patient presents with  . Coronary Artery Disease      History of Present Illness: Jeremy Lopez is a 65 y.o. 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 Labs revealed normal levels,. With normal stress test.  He comes today for annual checkup.  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  He will still require his stress test, and labs.  He denies any chest pain or shortness of breath, he has increased his exercise and been watching his weight and has lost 20 pounds.  He states he walks 5 miles a day.  Past Medical History  Diagnosis Date  . CAD (coronary artery disease), native coronary artery     DES proximal LAD 07/2009; scattered 20-40% lesions; normal LVEF  . Small cell lung cancer ([place])     Right upper lobe 2008; no evidence of disease since treatment ended in 11/2007  . Essential hypertension   . Hyperlipidemia     Past Surgical History  Procedure Laterality Date  . Video bronchoscopy and video mediastinoscopy  2008  . Insertion of left subclavian port-a-cath  2008     Current Outpatient Prescriptions  Medication Sig Dispense Refill  . aspirin 81 MG tablet Take 81 mg by mouth daily.     Marland Kitchen diltiazem (CARDIZEM CD) 180 MG 24 hr capsule Take 1 capsule (180 mg total) by mouth daily. 90 capsule 3  . metoprolol succinate (TOPROL-XL) 50 MG 24 hr tablet TAKE 1 TABLET EVERY DAY 30 tablet 11  . rosuvastatin (CRESTOR) 40 MG tablet Take 1 tablet (40 mg total) by mouth daily. 90  tablet 3   No current facility-administered medications for this visit.    Allergies:   Cephalexin    Social History:  The patient  reports that he quit smoking about 17 years ago. His smoking use included Cigarettes. He started smoking about 49 years ago. He has never used smokeless tobacco. He reports that he drinks alcohol. He reports that he does not use illicit drugs.   Family History:  The patient's family history includes CAD in his brother; Cancer in his father; Hypertension in his brother.    ROS: All other systems are reviewed and negative. Unless otherwise mentioned in H&P    PHYSICAL EXAM: VS:  BP 134/78 mmHg  Pulse 77  Ht '5\' 10"'$  (1.778 m)  Wt 207 lb (93.895 kg)  BMI 29.70 kg/m2  SpO2 96% , BMI Body mass index is 29.7 kg/(m^2). GEN: Well nourished, well developed, in no acute distress HEENT: normal Neck: no JVD, carotid bruits, or masses Cardiac: RRR; no murmurs, rubs, or gallops,no edema  Respiratory:  clear to auscultation bilaterally, normal work of breathing GI: soft, nontender, nondistended, + BS MS: no deformity or atrophy Skin: warm and dry, no rash Neuro:  Strength and sensation are intact Psych: euthymic mood, full affect     Recent Labs: 01/31/2015: ALT 13; BUN 15; Creat 0.98; Hemoglobin 13.6; Platelets 325; Potassium  4.7; Sodium 137    Lipid Panel    Component Value Date/Time   CHOL 98 01/31/2015 1620   TRIG 95 01/31/2015 1620   HDL 36* 01/31/2015 1620   CHOLHDL 2.7 01/31/2015 1620   VLDL 19 01/31/2015 1620   LDLCALC 43 01/31/2015 1620      Wt Readings from Last 3 Encounters:  12/20/15 207 lb (93.895 kg)  01/31/15 222 lb (100.699 kg)  10/25/14 222 lb (100.699 kg)      ASSESSMENT AND PLAN:  1. Coronary artery disease::drug-eluting stent to the proximal LAD in 2010.he has been asymptomatic.  He continues on aspirin, metoprolol, and statin. To  comply with [place] regulations, we will schedule him for a nuclear Cardiolite stress test. We  will also check fasting lipids LFTs BMET, and CBC.  2. Obesity:he has lost 20 pounds.  I have advised him if he continues to lose weight.  We will have to adjust his medications down or possibly eliminate.  I have advised him to get a blood pressure machine and check it daily.  If his blood pressure begins to drop or he begins to have dizziness.  He is to let us know so that we can adjust his medicines.   Current medicines are reviewed at length with the patient today.    Labs/ tests ordered today include: CBC, cemented, fasting lipids LFTs, and a nuclear Cardiolite stress test.  Orders Placed This Encounter  Procedures  . NM Myocar Multi W/Spect W/Wall Motion / EF     Disposition:   FU with  1 yr Signed, Jory Sims, NP  12/20/2015 2:51 PM    [facility] [address], [place], [place] [address] Phone: [phone]; Fax: [phone]

## 2015-12-20 NOTE — Patient Instructions (Signed)
Your physician wants you to follow-up in: 1 year with Dr Theressa Stamps will receive a reminder letter in the mail two months in advance. If you don't receive a letter, please call our office to schedule the follow-up appointment.   Your physician recommends that you continue on your current medications as directed. Please refer to the Current Medication list given to you today.    Your physician has requested that you have en exercise stress myoview. For further information please visit [email or web address]. Please follow instruction sheet, as given.    Your physician recommends that you return for lab work in: NOW  CBC,BMET,LIPID,LFT's      Thank you for choosing [facility] !

## 2015-12-27 ENCOUNTER — Encounter: Payer: Self-pay | Admitting: Family Medicine

## 2015-12-29 ENCOUNTER — Encounter (HOSPITAL_COMMUNITY): Payer: BLUE CROSS/BLUE SHIELD

## 2015-12-29 ENCOUNTER — Inpatient Hospital Stay (HOSPITAL_COMMUNITY): Admission: RE | Admit: 2015-12-29 | Payer: BLUE CROSS/BLUE SHIELD | Source: Ambulatory Visit

## 2015-12-29 ENCOUNTER — Encounter (HOSPITAL_COMMUNITY): Admission: RE | Admit: 2015-12-29 | Payer: BLUE CROSS/BLUE SHIELD | Source: Ambulatory Visit

## 2015-12-29 ENCOUNTER — Other Ambulatory Visit: Payer: Self-pay

## 2015-12-29 ENCOUNTER — Ambulatory Visit (HOSPITAL_COMMUNITY)
Admission: RE | Admit: 2015-12-29 | Discharge: 2015-12-29 | Disposition: A | Discharge status: Home / Self Care | Payer: BLUE CROSS/BLUE SHIELD | Source: Ambulatory Visit | Attending: Adult Health | Admitting: Adult Health

## 2015-12-29 DIAGNOSIS — I2510 Atherosclerotic heart disease of native coronary artery without angina pectoris: Secondary | ICD-10-CM

## 2015-12-29 LAB — EXERCISE TOLERANCE TEST
CHL CUP RESTING HR STRESS: 71 {beats}/min
CHL RATE OF PERCEIVED EXERTION: 15
CSEPHR: 102 %
Estimated workload: 11.8 METS
Exercise duration (min): 9 min
Exercise duration (sec): 31 s
MPHR: 156 {beats}/min
Peak HR: 160 {beats}/min

## 2015-12-30 ENCOUNTER — Telehealth: Payer: Self-pay | Admitting: *Deleted

## 2015-12-30 NOTE — Telephone Encounter (Signed)
Called patient with test results. No answer. Left message to call back.  

## 2015-12-30 NOTE — Telephone Encounter (Signed)
-----   Message from Lendon Colonel, NP sent at 12/29/2015  4:17 PM EDT ----- Normal stress test. 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

## 2016-01-04 ENCOUNTER — Other Ambulatory Visit: Payer: Self-pay | Admitting: Family Medicine

## 2016-01-04 ENCOUNTER — Encounter: Payer: Self-pay | Admitting: Family Medicine

## 2016-01-04 ENCOUNTER — Encounter (INDEPENDENT_AMBULATORY_CARE_PROVIDER_SITE_OTHER): Payer: Self-pay | Admitting: Family Medicine

## 2016-01-04 DIAGNOSIS — E782 Mixed hyperlipidemia: Secondary | ICD-10-CM

## 2016-01-04 DIAGNOSIS — I2510 Atherosclerotic heart disease of native coronary artery without angina pectoris: Secondary | ICD-10-CM

## 2016-01-04 DIAGNOSIS — I10 Essential (primary) hypertension: Secondary | ICD-10-CM

## 2016-01-04 NOTE — Progress Notes (Unsigned)
First class FAA flight physical

## 2016-01-05 ENCOUNTER — Encounter: Payer: Self-pay | Admitting: Family Medicine

## 2016-01-05 LAB — CBC WITH DIFFERENTIAL/PLATELET
Basophils Absolute: 0.1 10*3/uL (ref 0.0–0.2)
Basos: 1 %
EOS (ABSOLUTE): 0.1 10*3/uL (ref 0.0–0.4)
EOS: 2 %
HEMATOCRIT: 42.9 % (ref 37.5–51.0)
HEMOGLOBIN: 14.4 g/dL (ref 12.6–17.7)
IMMATURE GRANS (ABS): 0 10*3/uL (ref 0.0–0.1)
IMMATURE GRANULOCYTES: 0 %
LYMPHS ABS: 1.5 10*3/uL (ref 0.7–3.1)
Lymphs: 22 %
MCH: 30.3 pg (ref 26.6–33.0)
MCHC: 33.6 g/dL (ref 31.5–35.7)
MCV: 90 fL (ref 79–97)
MONOCYTES: 9 %
Monocytes Absolute: 0.6 10*3/uL (ref 0.1–0.9)
Neutrophils Absolute: 4.6 10*3/uL (ref 1.4–7.0)
Neutrophils: 66 %
Platelets: 297 10*3/uL (ref 150–379)
RBC: 4.76 x10E6/uL (ref 4.14–5.80)
RDW: 14 % (ref 12.3–15.4)
WBC: 6.9 10*3/uL (ref 3.4–10.8)

## 2016-01-05 LAB — COMPREHENSIVE METABOLIC PANEL
ALK PHOS: 67 IU/L (ref 39–117)
ALT: 14 IU/L (ref 0–44)
AST: 17 IU/L (ref 0–40)
Albumin/Globulin Ratio: 2.1 (ref 1.2–2.2)
Albumin: 4.4 g/dL (ref 3.6–4.8)
BUN/Creatinine Ratio: 15 (ref 10–22)
BUN: 16 mg/dL (ref 8–27)
Bilirubin Total: 0.4 mg/dL (ref 0.0–1.2)
CO2: 26 mmol/L (ref 18–29)
Calcium: 9.1 mg/dL (ref 8.6–10.2)
Chloride: 101 mmol/L (ref 96–106)
Creatinine, Ser: 1.09 mg/dL (ref 0.76–1.27)
GFR calc Af Amer: 82 mL/min/{1.73_m2} (ref >59)
GFR, EST NON AFRICAN AMERICAN: 71 mL/min/{1.73_m2} (ref >59)
GLOBULIN, TOTAL: 2.1 g/dL (ref 1.5–4.5)
Glucose: 83 mg/dL (ref 65–99)
POTASSIUM: 4.4 mmol/L (ref 3.5–5.2)
SODIUM: 140 mmol/L (ref 134–144)
Total Protein: 6.5 g/dL (ref 6.0–8.5)

## 2016-01-05 LAB — LIPID PANEL W/O CHOL/HDL RATIO
Cholesterol, Total: 120 mg/dL (ref 100–199)
HDL: 47 mg/dL (ref >39)
LDL Calculated: 51 mg/dL (ref 0–99)
TRIGLYCERIDES: 109 mg/dL (ref 0–149)
VLDL Cholesterol Cal: 22 mg/dL (ref 5–40)

## 2016-03-14 ENCOUNTER — Other Ambulatory Visit: Payer: Self-pay | Admitting: Adult Health

## 2016-05-21 ENCOUNTER — Ambulatory Visit (INDEPENDENT_AMBULATORY_CARE_PROVIDER_SITE_OTHER): Payer: BLUE CROSS/BLUE SHIELD | Admitting: Otolaryngology

## 2016-07-10 ENCOUNTER — Other Ambulatory Visit: Payer: Self-pay | Admitting: Adult Health

## 2016-07-12 ENCOUNTER — Other Ambulatory Visit: Payer: Self-pay | Admitting: Adult Health

## 2016-07-12 ENCOUNTER — Other Ambulatory Visit: Payer: Self-pay | Admitting: *Deleted

## 2016-07-12 MED ORDER — METOPROLOL SUCCINATE ER 50 MG PO TB24: 50.0000 mg | ORAL_TABLET | Freq: Every day | ORAL | 11 refills | Status: DC

## 2016-07-12 NOTE — Telephone Encounter (Signed)
Needs Metoprolol sent to [facility] / tg

## 2016-07-12 NOTE — Telephone Encounter (Signed)
Refill complete 

## 2016-08-23 DIAGNOSIS — J42 Unspecified chronic bronchitis: Secondary | ICD-10-CM | POA: Diagnosis not present

## 2016-08-23 DIAGNOSIS — J209 Acute bronchitis, unspecified: Secondary | ICD-10-CM | POA: Diagnosis not present

## 2016-09-13 ENCOUNTER — Other Ambulatory Visit (HOSPITAL_COMMUNITY): Payer: Self-pay | Admitting: Internal Medicine

## 2016-09-13 DIAGNOSIS — R319 Hematuria, unspecified: Secondary | ICD-10-CM

## 2016-09-13 DIAGNOSIS — R1011 Right upper quadrant pain: Secondary | ICD-10-CM | POA: Diagnosis not present

## 2016-09-14 ENCOUNTER — Ambulatory Visit (HOSPITAL_COMMUNITY): Payer: Medicare Other

## 2016-09-14 DIAGNOSIS — R319 Hematuria, unspecified: Secondary | ICD-10-CM | POA: Diagnosis not present

## 2016-09-14 DIAGNOSIS — R1011 Right upper quadrant pain: Secondary | ICD-10-CM | POA: Diagnosis not present

## 2017-01-21 ENCOUNTER — Other Ambulatory Visit (HOSPITAL_COMMUNITY): Payer: Self-pay | Admitting: Oncology

## 2017-01-21 DIAGNOSIS — C3400 Malignant neoplasm of unspecified main bronchus: Secondary | ICD-10-CM

## 2017-01-23 ENCOUNTER — Other Ambulatory Visit (HOSPITAL_COMMUNITY): Payer: Self-pay | Admitting: Oncology

## 2017-01-23 DIAGNOSIS — C3490 Malignant neoplasm of unspecified part of unspecified bronchus or lung: Secondary | ICD-10-CM

## 2017-02-08 ENCOUNTER — Ambulatory Visit (HOSPITAL_COMMUNITY)
Admission: RE | Admit: 2017-02-08 | Discharge: 2017-02-08 | Disposition: A | Discharge status: Home / Self Care | Payer: Medicare Other | Source: Ambulatory Visit | Attending: Oncology | Admitting: Oncology

## 2017-02-08 DIAGNOSIS — C3411 Malignant neoplasm of upper lobe, right bronchus or lung: Secondary | ICD-10-CM | POA: Diagnosis not present

## 2017-02-08 DIAGNOSIS — I2510 Atherosclerotic heart disease of native coronary artery without angina pectoris: Secondary | ICD-10-CM | POA: Insufficient documentation

## 2017-02-08 DIAGNOSIS — C3490 Malignant neoplasm of unspecified part of unspecified bronchus or lung: Secondary | ICD-10-CM

## 2017-02-08 DIAGNOSIS — I700 Atherosclerosis of aorta: Secondary | ICD-10-CM | POA: Diagnosis not present

## 2017-02-08 LAB — POCT I-STAT CREATININE: Creatinine, Ser: 1 mg/dL (ref 0.61–1.24)

## 2017-02-08 MED ORDER — IOPAMIDOL (ISOVUE-300) INJECTION 61%
75.0000 mL | Freq: Once | INTRAVENOUS | Status: AC | PRN
  Administered 2017-02-08: 75 mL via INTRAVENOUS

## 2017-04-28 ENCOUNTER — Other Ambulatory Visit: Payer: Self-pay | Admitting: Adult Health

## 2017-05-20 DIAGNOSIS — J209 Acute bronchitis, unspecified: Secondary | ICD-10-CM | POA: Diagnosis not present

## 2017-05-20 DIAGNOSIS — Z6831 Body mass index (BMI) 31.0-31.9, adult: Secondary | ICD-10-CM | POA: Diagnosis not present

## 2017-08-14 DIAGNOSIS — H6990 Unspecified Eustachian tube disorder, unspecified ear: Secondary | ICD-10-CM | POA: Diagnosis not present

## 2017-08-15 ENCOUNTER — Other Ambulatory Visit: Payer: Self-pay | Admitting: Adult Health

## 2017-09-03 ENCOUNTER — Other Ambulatory Visit: Payer: Self-pay | Admitting: *Deleted

## 2017-09-03 MED ORDER — ROSUVASTATIN CALCIUM 40 MG PO TABS: ORAL_TABLET | ORAL | 0 refills | Status: DC

## 2017-09-20 DIAGNOSIS — H9201 Otalgia, right ear: Secondary | ICD-10-CM | POA: Diagnosis not present

## 2017-09-20 DIAGNOSIS — H9311 Tinnitus, right ear: Secondary | ICD-10-CM | POA: Diagnosis not present

## 2017-09-20 DIAGNOSIS — S0300XA Dislocation of jaw, unspecified side, initial encounter: Secondary | ICD-10-CM | POA: Diagnosis not present

## 2017-09-26 ENCOUNTER — Other Ambulatory Visit: Payer: Self-pay | Admitting: *Deleted

## 2017-09-30 DIAGNOSIS — Z9221 Personal history of antineoplastic chemotherapy: Secondary | ICD-10-CM | POA: Diagnosis not present

## 2017-09-30 DIAGNOSIS — R51 Headache: Secondary | ICD-10-CM | POA: Diagnosis not present

## 2017-09-30 DIAGNOSIS — Z85118 Personal history of other malignant neoplasm of bronchus and lung: Secondary | ICD-10-CM | POA: Diagnosis not present

## 2017-09-30 DIAGNOSIS — R42 Dizziness and giddiness: Secondary | ICD-10-CM | POA: Diagnosis not present

## 2017-09-30 DIAGNOSIS — Z6831 Body mass index (BMI) 31.0-31.9, adult: Secondary | ICD-10-CM | POA: Diagnosis not present

## 2017-09-30 DIAGNOSIS — Z95828 Presence of other vascular implants and grafts: Secondary | ICD-10-CM | POA: Diagnosis not present

## 2017-09-30 DIAGNOSIS — H9311 Tinnitus, right ear: Secondary | ICD-10-CM | POA: Diagnosis not present

## 2017-09-30 DIAGNOSIS — E669 Obesity, unspecified: Secondary | ICD-10-CM | POA: Diagnosis not present

## 2017-09-30 DIAGNOSIS — I10 Essential (primary) hypertension: Secondary | ICD-10-CM | POA: Diagnosis not present

## 2017-10-07 ENCOUNTER — Ambulatory Visit (INDEPENDENT_AMBULATORY_CARE_PROVIDER_SITE_OTHER): Payer: Medicare Other | Admitting: Otolaryngology

## 2017-10-09 ENCOUNTER — Other Ambulatory Visit (HOSPITAL_COMMUNITY): Payer: Self-pay | Admitting: Oncology

## 2017-10-09 DIAGNOSIS — G4489 Other headache syndrome: Secondary | ICD-10-CM

## 2017-10-09 DIAGNOSIS — C761 Malignant neoplasm of thorax: Secondary | ICD-10-CM

## 2017-10-09 DIAGNOSIS — C3490 Malignant neoplasm of unspecified part of unspecified bronchus or lung: Secondary | ICD-10-CM

## 2017-10-09 DIAGNOSIS — R42 Dizziness and giddiness: Secondary | ICD-10-CM

## 2017-10-16 ENCOUNTER — Other Ambulatory Visit: Payer: Self-pay

## 2017-10-17 ENCOUNTER — Ambulatory Visit (HOSPITAL_COMMUNITY)
Admission: RE | Admit: 2017-10-17 | Discharge: 2017-10-17 | Disposition: A | Discharge status: Home / Self Care | Payer: Medicare Other | Source: Ambulatory Visit | Attending: Oncology | Admitting: Oncology

## 2017-10-17 ENCOUNTER — Encounter (HOSPITAL_COMMUNITY): Payer: Self-pay

## 2017-10-17 DIAGNOSIS — G9389 Other specified disorders of brain: Secondary | ICD-10-CM | POA: Diagnosis not present

## 2017-10-17 DIAGNOSIS — C761 Malignant neoplasm of thorax: Secondary | ICD-10-CM | POA: Diagnosis not present

## 2017-10-17 DIAGNOSIS — C3490 Malignant neoplasm of unspecified part of unspecified bronchus or lung: Secondary | ICD-10-CM | POA: Insufficient documentation

## 2017-10-17 DIAGNOSIS — J8410 Pulmonary fibrosis, unspecified: Secondary | ICD-10-CM | POA: Diagnosis not present

## 2017-10-17 DIAGNOSIS — I2510 Atherosclerotic heart disease of native coronary artery without angina pectoris: Secondary | ICD-10-CM | POA: Diagnosis not present

## 2017-10-17 DIAGNOSIS — G4489 Other headache syndrome: Secondary | ICD-10-CM | POA: Insufficient documentation

## 2017-10-17 DIAGNOSIS — R42 Dizziness and giddiness: Secondary | ICD-10-CM | POA: Insufficient documentation

## 2017-10-17 DIAGNOSIS — I700 Atherosclerosis of aorta: Secondary | ICD-10-CM | POA: Insufficient documentation

## 2017-10-17 DIAGNOSIS — R51 Headache: Secondary | ICD-10-CM | POA: Diagnosis not present

## 2017-10-17 MED ORDER — IOPAMIDOL (ISOVUE-300) INJECTION 61%
75.0000 mL | Freq: Once | INTRAVENOUS | Status: AC | PRN
  Administered 2017-10-17: 75 mL via INTRAVENOUS

## 2017-10-17 MED ORDER — GADOBENATE DIMEGLUMINE 529 MG/ML IV SOLN
20.0000 mL | Freq: Once | INTRAVENOUS | Status: AC | PRN
  Administered 2017-10-17: 20 mL via INTRAVENOUS

## 2017-10-29 ENCOUNTER — Other Ambulatory Visit: Payer: Self-pay | Admitting: Internal Medicine

## 2017-10-29 MED ORDER — METOPROLOL SUCCINATE ER 50 MG PO TB24: 50.0000 mg | ORAL_TABLET | Freq: Every day | ORAL | 0 refills | Status: DC

## 2017-10-29 NOTE — Telephone Encounter (Signed)
Needing 90 day supply of metoprolol succinate (TOPROL-XL) 50 MG 24 hr tablet [[id number]]  Sent to [facility], he now uses [facility] instead of [facility]

## 2017-10-29 NOTE — Telephone Encounter (Signed)
F/u apt made with Dr Harrington Challenger

## 2017-11-08 DIAGNOSIS — J42 Unspecified chronic bronchitis: Secondary | ICD-10-CM | POA: Diagnosis not present

## 2017-11-08 DIAGNOSIS — Z6831 Body mass index (BMI) 31.0-31.9, adult: Secondary | ICD-10-CM | POA: Diagnosis not present

## 2017-12-27 ENCOUNTER — Encounter: Payer: Self-pay | Admitting: Internal Medicine

## 2017-12-27 ENCOUNTER — Ambulatory Visit (INDEPENDENT_AMBULATORY_CARE_PROVIDER_SITE_OTHER): Payer: Medicare Other | Admitting: Internal Medicine

## 2017-12-27 ENCOUNTER — Other Ambulatory Visit (HOSPITAL_COMMUNITY)
Admission: RE | Admit: 2017-12-27 | Discharge: 2017-12-27 | Disposition: A | Discharge status: Home / Self Care | Payer: Medicare Other | Source: Ambulatory Visit | Attending: Internal Medicine | Admitting: Internal Medicine

## 2017-12-27 VITALS — BP 124/70 | HR 74 | Ht 70.0 in | Wt 220.0 lb

## 2017-12-27 DIAGNOSIS — E782 Mixed hyperlipidemia: Secondary | ICD-10-CM

## 2017-12-27 DIAGNOSIS — I2510 Atherosclerotic heart disease of native coronary artery without angina pectoris: Secondary | ICD-10-CM | POA: Diagnosis not present

## 2017-12-27 DIAGNOSIS — I10 Essential (primary) hypertension: Secondary | ICD-10-CM | POA: Diagnosis not present

## 2017-12-27 LAB — LIPID PANEL
CHOL/HDL RATIO: 2.4 ratio
Cholesterol: 94 mg/dL (ref 0–200)
HDL: 39 mg/dL — ABNORMAL LOW (ref >40)
LDL Cholesterol: 42 mg/dL (ref 0–99)
Triglycerides: 64 mg/dL (ref <150)
VLDL: 13 mg/dL (ref 0–40)

## 2017-12-27 NOTE — Patient Instructions (Signed)
Medication Instructions:  Your physician recommends that you continue on your current medications as directed. Please refer to the Current Medication list given to you today.   Labwork: Your physician recommends that you return for lab work Fasting    Testing/Procedures: NONE   Follow-Up: Your physician wants you to follow-up in: 1 Year with Dr. Harrington Challenger. You will receive a reminder letter in the mail two months in advance. If you don't receive a letter, please call our office to schedule the follow-up appointment.   Any Other Special Instructions Will Be Listed Below (If Applicable).     If you need a refill on your cardiac medications before your next appointment, please call your pharmacy.  Thank you for choosing [facility]!

## 2017-12-27 NOTE — Progress Notes (Signed)
Cardiology Office Note   Date:  12/27/2017   ID:  Jeremy Lopez, DOB April 14, 1951, MRN [id number]  PCP:  Asencion Noble, MD  Cardiologist:  Dr. Johnna Acosta, MD   Pt presents for f/u of CAD      History of Present Illness: Jeremy Lopez is a 67 y.o. male with history of CAD   He is s/p DES to LAD in 2010  Last stress test was in March 2017  Since seen he has retired from [employer]    He denies CP   Breathing is OK    He is not exercising regularly  Past Medical History:  Diagnosis Date  . CAD (coronary artery disease), native coronary artery    DES proximal LAD 07/2009; scattered 20-40% lesions; normal LVEF  . Essential hypertension   . Hyperlipidemia   . Small cell lung cancer ([place])    Right upper lobe 2008; no evidence of disease since treatment ended in 11/2007    Past Surgical History:  Procedure Laterality Date  . Insertion of left subclavian Port-A-Cath  2008  . Video bronchoscopy and video mediastinoscopy  2008     Current Outpatient Medications  Medication Sig Dispense Refill  . aspirin 81 MG tablet Take 81 mg by mouth daily.     Marland Kitchen DILT-XR 240 MG 24 hr capsule Take by mouth daily.  3  . metoprolol succinate (TOPROL-XL) 50 MG 24 hr tablet Take 1 tablet (50 mg total) by mouth daily. Take with or immediately following a meal. 90 tablet 0  . rosuvastatin (CRESTOR) 40 MG tablet TAKE 1 TABLET (40 MG TOTAL) BY MOUTH DAILY. 30 tablet 0   No current facility-administered medications for this visit.     Allergies:   Cephalexin    Social History:  The patient  reports that he quit smoking about 19 years ago. His smoking use included cigarettes. He started smoking about 51 years ago. He has never used smokeless tobacco. He reports that he drinks alcohol. He reports that he does not use drugs.   Family History:  The patient's family history includes CAD in his brother; Cancer in his father; Hypertension in his brother.    ROS: All other systems are reviewed and negative.  Unless otherwise mentioned in H&P    PHYSICAL EXAM: VS:  BP 124/70   Pulse 74   Ht 5\' 10"  (1.778 m)   Wt 220 lb (99.8 kg)   SpO2 94%   BMI 31.57 kg/m  , BMI Body mass index is 31.57 kg/m. GEN: Obese 67 yo , in no acute distress HEENT: normal Neck: no JVD, carotid bruits, or masses Cardiac: RRR; no murmurs, rubs, or gallops,no edema  Respiratory:  clear to auscultation bilaterally, normal work of breathing GI: soft, nontender, nondistended, + BS MS: no deformity or atrophy Skin: warm and dry, no rash Neuro:  Strength and sensation are intact Psych: euthymic mood, full affect   EKG   SR 70 bpm    Recent Labs: 02/08/2017: Creatinine, Ser 1.00    Lipid Panel    Component Value Date/Time   CHOL 120 01/04/2016 1611   TRIG 109 01/04/2016 1611   HDL 47 01/04/2016 1611   CHOLHDL 2.7 01/31/2015 1620   VLDL 19 01/31/2015 1620   LDLCALC 51 01/04/2016 1611      Wt Readings from Last 3 Encounters:  12/27/17 220 lb (99.8 kg)  01/04/16 206 lb (93.4 kg)  12/20/15 207 lb (93.9 kg)      ASSESSMENT  AND PLAN:  1. Coronary artery disease::drug-eluting stent to the proximal LAD in 2010. No symptoms of angina   Encouraged him to increase activity   2. Obesity:Encouraged him to exercise    3  HL  Last lipids in March 2019 LDL was 43  HDL 39    4  HTN  BP is good     F/U next winter     Current medicines are reviewed at length with the patient today.    Labs/ tests ordered today include: CBC, cemented, fasting lipids LFTs, and a nuclear Cardiolite stress test.  Orders Placed This Encounter  Procedures  . EKG 12-Lead     Disposition:   FU with  1 yr Signed, Dorris Carnes, MD  12/27/2017 9:55 AM    [facility]. [address] [place], [place] [address] Phone: [phone]; Fax: [phone]

## 2018-01-23 ENCOUNTER — Other Ambulatory Visit: Payer: Self-pay | Admitting: Internal Medicine

## 2018-02-11 DIAGNOSIS — G9389 Other specified disorders of brain: Secondary | ICD-10-CM | POA: Diagnosis not present

## 2018-02-11 DIAGNOSIS — C3490 Malignant neoplasm of unspecified part of unspecified bronchus or lung: Secondary | ICD-10-CM | POA: Diagnosis not present

## 2018-02-11 DIAGNOSIS — R42 Dizziness and giddiness: Secondary | ICD-10-CM | POA: Diagnosis not present

## 2018-02-11 DIAGNOSIS — Z95828 Presence of other vascular implants and grafts: Secondary | ICD-10-CM | POA: Diagnosis not present

## 2018-02-11 DIAGNOSIS — R51 Headache: Secondary | ICD-10-CM | POA: Diagnosis not present

## 2018-03-25 DIAGNOSIS — Z95828 Presence of other vascular implants and grafts: Secondary | ICD-10-CM | POA: Diagnosis not present

## 2018-03-25 DIAGNOSIS — C3490 Malignant neoplasm of unspecified part of unspecified bronchus or lung: Secondary | ICD-10-CM | POA: Diagnosis not present

## 2018-04-21 ENCOUNTER — Other Ambulatory Visit: Payer: Self-pay | Admitting: Adult Health

## 2018-04-21 NOTE — Telephone Encounter (Signed)
Rx has been sent to the pharmacy electronically. ° °

## 2018-05-07 DIAGNOSIS — S91332A Puncture wound without foreign body, left foot, initial encounter: Secondary | ICD-10-CM | POA: Diagnosis not present

## 2018-06-17 DIAGNOSIS — D485 Neoplasm of uncertain behavior of skin: Secondary | ICD-10-CM | POA: Diagnosis not present

## 2018-06-18 DIAGNOSIS — L820 Inflamed seborrheic keratosis: Secondary | ICD-10-CM | POA: Diagnosis not present

## 2018-06-25 DIAGNOSIS — J441 Chronic obstructive pulmonary disease with (acute) exacerbation: Secondary | ICD-10-CM | POA: Diagnosis not present

## 2018-06-25 DIAGNOSIS — Z23 Encounter for immunization: Secondary | ICD-10-CM | POA: Diagnosis not present

## 2018-08-19 DIAGNOSIS — J441 Chronic obstructive pulmonary disease with (acute) exacerbation: Secondary | ICD-10-CM | POA: Diagnosis not present

## 2018-09-08 DIAGNOSIS — J441 Chronic obstructive pulmonary disease with (acute) exacerbation: Secondary | ICD-10-CM | POA: Diagnosis not present

## 2019-01-09 ENCOUNTER — Telehealth (INDEPENDENT_AMBULATORY_CARE_PROVIDER_SITE_OTHER): Payer: Medicare Other | Admitting: Internal Medicine

## 2019-01-09 DIAGNOSIS — I2510 Atherosclerotic heart disease of native coronary artery without angina pectoris: Secondary | ICD-10-CM | POA: Diagnosis not present

## 2019-01-09 DIAGNOSIS — E782 Mixed hyperlipidemia: Secondary | ICD-10-CM

## 2019-01-09 DIAGNOSIS — I10 Essential (primary) hypertension: Secondary | ICD-10-CM

## 2019-01-09 MED ORDER — METOPROLOL SUCCINATE ER 50 MG PO TB24: 50.0000 mg | ORAL_TABLET | Freq: Every day | ORAL | 3 refills | Status: DC

## 2019-01-09 MED ORDER — ROSUVASTATIN CALCIUM 40 MG PO TABS: ORAL_TABLET | ORAL | 3 refills | Status: DC

## 2019-01-09 MED ORDER — DILTIAZEM HCL ER COATED BEADS 240 MG PO CP24: 240.0000 mg | ORAL_CAPSULE | Freq: Every day | ORAL | 3 refills | Status: DC

## 2019-01-09 NOTE — Progress Notes (Signed)

## 2019-01-09 NOTE — Progress Notes (Signed)
Virtual Visit via Telephone Note  I connected with Jeremy Lopez on 01/09/19 at  2:00 PM EDT by telephone and verified that I am speaking with the correct person using two identifiers.   I discussed the limitations, risks, security and privacy concerns of performing an evaluation and management service by telephone and the availability of in person appointments. I also discussed with the patient that there may be a patient responsible charge related to this service. The patient expressed understanding and agreed to proceed.   History of Present Illness:  Pt is a 68 yo with hx of CAD   He is s/p DES to LAD in 2010    Stress teset in 2017   I saw him in 2019  Since seen he continues to do well from cardiac standpoint.      NO CP   No dizziness Staying at home with current COVID crisis Has recovered from a bronchitis though still taking some cold meds OTC    Breathing is OK  BP is 153/88 today   Usually in 130s to 140s   P 72   T 98.4       Assessment and Plan:  1   CAD   No symptoms to sugg angina   Keep on same meds  2   HTN   Follow BP   High today but did take OTC cold meds which may effect   Follow  Call if remains high  3   HL  Keep on meds  F/U in 1 year unless develops symptoms   He follows with [facility]   Follow Up Instructions:    I discussed the assessment and treatment plan with the patient. The patient was provided an opportunity to ask questions and all were answered. The patient agreed with the plan and demonstrated an understanding of the instructions.   The patient was advised to call back or seek an in-person evaluation if the symptoms worsen or if the condition fails to improve as anticipated.  I provided 15 minutes of non-face-to-face time during this encounter.   Dorris Carnes, MD

## 2019-08-11 ENCOUNTER — Other Ambulatory Visit: Payer: Self-pay

## 2019-08-11 DIAGNOSIS — Z20822 Contact with and (suspected) exposure to covid-19: Secondary | ICD-10-CM

## 2019-08-11 DIAGNOSIS — Z20828 Contact with and (suspected) exposure to other viral communicable diseases: Secondary | ICD-10-CM | POA: Diagnosis not present

## 2019-08-13 LAB — NOVEL CORONAVIRUS, NAA: SARS-CoV-2, NAA: NOT DETECTED

## 2019-11-09 DIAGNOSIS — Z23 Encounter for immunization: Secondary | ICD-10-CM | POA: Diagnosis not present

## 2019-11-15 NOTE — Progress Notes (Signed)
Cardiology Office Note   Date:  11/16/2019   ID:  Jeremy Lopez, DOB 1951-08-29, MRN [id number]  PCP:  Asencion Noble, MD  Cardiologist:   Dorris Carnes, MD    Patient presents for f/u of CAD    History of Present Illness: Jeremy Lopez is a 69 y.o. male with a history of  CAD   He is s/p PTCA/DES in 2010   Pt had a stress test in 2010   I last saw him in clinic in 2020   Since seen the pt denies CP   He has had a few bouts of bronhcitis but otherwise is breathing OK   Does say he feels somewhat sluggish  ? If due to diltiazem      Current Meds  Medication Sig  . aspirin 81 MG tablet Take 81 mg by mouth daily.   Marland Kitchen diltiazem (CARTIA XT) 240 MG 24 hr capsule Take 1 capsule (240 mg total) by mouth daily.  . metoprolol succinate (TOPROL-XL) 50 MG 24 hr tablet Take 1 tablet (50 mg total) by mouth daily.  . rosuvastatin (CRESTOR) 40 MG tablet TAKE ONE (1) TABLET BY MOUTH EVERY DAY  . [DISCONTINUED] DILT-XR 240 MG 24 hr capsule Take by mouth daily.     Allergies:   Cephalexin   Past Medical History:  Diagnosis Date  . CAD (coronary artery disease), native coronary artery    DES proximal LAD 07/2009; scattered 20-40% lesions; normal LVEF  . Essential hypertension   . Hyperlipidemia   . Small cell lung cancer ([place])    Right upper lobe 2008; no evidence of disease since treatment ended in 11/2007    Past Surgical History:  Procedure Laterality Date  . Insertion of left subclavian Port-A-Cath  2008  . Video bronchoscopy and video mediastinoscopy  2008     Social History:  The patient  reports that he quit smoking about 21 years ago. His smoking use included cigarettes. He started smoking about 53 years ago. He has never used smokeless tobacco. He reports current alcohol use. He reports that he does not use drugs.   Family History:  The patient's family history includes CAD in his brother; Cancer in his father; Hypertension in his brother.    ROS:  Please see the history of  present illness. All other systems are reviewed and  Negative to the above problem except as noted.    PHYSICAL EXAM: VS:  BP (!) 158/88   Pulse 69   Temp 98.1 F (36.7 C) (Temporal)   Ht 5\' 10"  (1.778 m)   Wt 225 lb (102.1 kg)   SpO2 98%   BMI 32.28 kg/m   GEN: Well nourished, well developed, in no acute distress  HEENT: normal  Neck: no JVD, carotid bruits, or masses Cardiac: RRR; no murmurs, rubs, or gallops,no edema  Respiratory:  clear to auscultation bilaterally, normal work of breathing GI: soft, nontender, nondistended, + BS  No hepatomegaly  MS: no deformity Moving all extremities   Skin: warm and dry,  Sl erythema Neuro:  Strength and sensation are intact Psych: euthymic mood, full affect   EKG:  EKG is not ordered today.   Lipid Panel    Component Value Date/Time   CHOL 94 12/27/2017 1045   CHOL 120 01/04/2016 1611   TRIG 64 12/27/2017 1045   HDL 39 (L) 12/27/2017 1045   HDL 47 01/04/2016 1611   CHOLHDL 2.4 12/27/2017 1045   VLDL 13 12/27/2017 1045  [place] 42 12/27/2017 1045   LDLCALC 51 01/04/2016 1611      Wt Readings from Last 3 Encounters:  11/16/19 225 lb (102.1 kg)  12/27/17 220 lb (99.8 kg)  01/04/16 206 lb (93.4 kg)      ASSESSMENT AND PLAN:  1  CAD  No symptoms to sugg angina  Follow   2  HTN  BP is not controlled   Wil lget labs   Consider switch frim diltiazem to something else   It may be holding his HR down    Will review after lab results back  3  HL  Will get lipids   4  COVID  Pt has had first shot  Getting second soon  5  Hx bronchitis   No symptoms now  ? Triggered by allergies  trila flonase and antihistamine     Current medicines are reviewed at length with the patient today.  The patient does not have concerns regarding medicines.  Signed, Dorris Carnes, MD  11/16/2019 11:59 AM    [facility] [place], [place], [place]  [address] Phone: [phone]; Fax: [phone]

## 2019-11-16 ENCOUNTER — Other Ambulatory Visit: Payer: Self-pay

## 2019-11-16 ENCOUNTER — Ambulatory Visit (INDEPENDENT_AMBULATORY_CARE_PROVIDER_SITE_OTHER): Payer: Medicare Other | Admitting: Internal Medicine

## 2019-11-16 ENCOUNTER — Encounter: Payer: Self-pay | Admitting: Internal Medicine

## 2019-11-16 VITALS — BP 158/88 | HR 69 | Temp 98.1°F | Ht 70.0 in | Wt 225.0 lb

## 2019-11-16 DIAGNOSIS — E782 Mixed hyperlipidemia: Secondary | ICD-10-CM | POA: Diagnosis not present

## 2019-11-16 DIAGNOSIS — Z79899 Other long term (current) drug therapy: Secondary | ICD-10-CM

## 2019-11-16 DIAGNOSIS — R5383 Other fatigue: Secondary | ICD-10-CM | POA: Diagnosis not present

## 2019-11-16 DIAGNOSIS — I10 Essential (primary) hypertension: Secondary | ICD-10-CM | POA: Diagnosis not present

## 2019-11-16 DIAGNOSIS — R3915 Urgency of urination: Secondary | ICD-10-CM

## 2019-11-16 NOTE — Patient Instructions (Signed)
Medication Instructions:  Your physician recommends that you continue on your current medications as directed. Please refer to the Current Medication list given to you today.  *If you need a refill on your cardiac medications before your next appointment, please call your pharmacy*  Lab Work: Your physician recommends that you return for lab work in: Today   If you have labs (blood work) drawn today and your tests are completely normal, you will receive your results only by: Marland Kitchen MyChart Message (if you have MyChart) OR . A paper copy in the mail If you have any lab test that is abnormal or we need to change your treatment, we will call you to review the results.  Testing/Procedures: NONE   Follow-Up: At [facility], you and your health needs are our priority.  As part of our continuing mission to provide you with exceptional heart care, we have created designated Provider Care Teams.  These Care Teams include your primary Cardiologist (physician) and Advanced Practice Providers (APPs -  Physician Assistants and Nurse Practitioners) who all work together to provide you with the care you need, when you need it.  Your next appointment:   4 month(s)  The format for your next appointment:   In Person  Provider:   Dorris Carnes, MD  Other Instructions Thank you for choosing [facility]!

## 2019-11-25 DIAGNOSIS — J441 Chronic obstructive pulmonary disease with (acute) exacerbation: Secondary | ICD-10-CM | POA: Diagnosis not present

## 2019-12-07 DIAGNOSIS — Z23 Encounter for immunization: Secondary | ICD-10-CM | POA: Diagnosis not present

## 2019-12-28 DIAGNOSIS — E559 Vitamin D deficiency, unspecified: Secondary | ICD-10-CM | POA: Diagnosis not present

## 2019-12-28 DIAGNOSIS — R5383 Other fatigue: Secondary | ICD-10-CM | POA: Diagnosis not present

## 2019-12-28 DIAGNOSIS — Z79899 Other long term (current) drug therapy: Secondary | ICD-10-CM | POA: Diagnosis not present

## 2019-12-28 DIAGNOSIS — E782 Mixed hyperlipidemia: Secondary | ICD-10-CM | POA: Diagnosis not present

## 2019-12-28 DIAGNOSIS — I10 Essential (primary) hypertension: Secondary | ICD-10-CM | POA: Diagnosis not present

## 2019-12-28 DIAGNOSIS — R3915 Urgency of urination: Secondary | ICD-10-CM | POA: Diagnosis not present

## 2019-12-28 LAB — BASIC METABOLIC PANEL
BUN: 23 mg/dL (ref 7–25)
CO2: 28 mmol/L (ref 20–32)
Calcium: 9.7 mg/dL (ref 8.6–10.3)
Chloride: 103 mmol/L (ref 98–110)
Creat: 1.21 mg/dL (ref 0.70–1.25)
Glucose, Bld: 107 mg/dL — ABNORMAL HIGH (ref 65–99)
Potassium: 4.7 mmol/L (ref 3.5–5.3)
Sodium: 139 mmol/L (ref 135–146)

## 2019-12-28 LAB — LIPID PANEL
Cholesterol: 128 mg/dL (ref <200)
HDL: 42 mg/dL (ref >=40)
LDL Cholesterol (Calc): 61 mg/dL (calc)
Non-HDL Cholesterol (Calc): 86 mg/dL (calc) (ref <130)
Total CHOL/HDL Ratio: 3 (calc) (ref <5.0)
Triglycerides: 175 mg/dL — ABNORMAL HIGH (ref <150)

## 2019-12-28 LAB — CBC
HCT: 42.5 % (ref 38.5–50.0)
Hemoglobin: 14.1 g/dL (ref 13.2–17.1)
MCH: 30 pg (ref 27.0–33.0)
MCHC: 33.2 g/dL (ref 32.0–36.0)
MCV: 90.4 fL (ref 80.0–100.0)
MPV: 8.8 fL (ref 7.5–12.5)
Platelets: 419 10*3/uL — ABNORMAL HIGH (ref 140–400)
RBC: 4.7 10*6/uL (ref 4.20–5.80)
RDW: 12.9 % (ref 11.0–15.0)
WBC: 8.9 10*3/uL (ref 3.8–10.8)

## 2019-12-28 LAB — PSA: PSA: 0.9 ng/mL (ref <=4.0)

## 2019-12-28 LAB — TSH: TSH: 2.78 mIU/L (ref 0.40–4.50)

## 2019-12-28 LAB — VITAMIN D 25 HYDROXY (VIT D DEFICIENCY, FRACTURES): Vit D, 25-Hydroxy: 24 ng/mL — ABNORMAL LOW (ref 30–100)

## 2020-01-19 ENCOUNTER — Other Ambulatory Visit: Payer: Self-pay | Admitting: Internal Medicine

## 2020-01-19 DIAGNOSIS — R531 Weakness: Secondary | ICD-10-CM | POA: Diagnosis not present

## 2020-01-19 DIAGNOSIS — R202 Paresthesia of skin: Secondary | ICD-10-CM | POA: Diagnosis not present

## 2020-03-10 ENCOUNTER — Ambulatory Visit (INDEPENDENT_AMBULATORY_CARE_PROVIDER_SITE_OTHER): Payer: Medicare Other | Admitting: Internal Medicine

## 2020-03-10 ENCOUNTER — Encounter: Payer: Self-pay | Admitting: *Deleted

## 2020-03-10 ENCOUNTER — Other Ambulatory Visit: Payer: Self-pay

## 2020-03-10 ENCOUNTER — Encounter: Payer: Self-pay | Admitting: Internal Medicine

## 2020-03-10 VITALS — BP 134/66 | HR 76 | Ht 70.0 in | Wt 229.4 lb

## 2020-03-10 DIAGNOSIS — I739 Peripheral vascular disease, unspecified: Secondary | ICD-10-CM

## 2020-03-10 NOTE — Patient Instructions (Signed)
Medication Instructions:  Your physician recommends that you continue on your current medications as directed. Please refer to the Current Medication list given to you today.  *If you need a refill on your cardiac medications before your next appointment, please call your pharmacy*   Lab Work: NONE  If you have labs (blood work) drawn today and your tests are completely normal, you will receive your results only by: Marland Kitchen MyChart Message (if you have MyChart) OR . A paper copy in the mail If you have any lab test that is abnormal or we need to change your treatment, we will call you to review the results.   Testing/Procedures: Your physician has requested that you have an ankle brachial index (ABI). During this test an ultrasound and blood pressure cuff are used to evaluate the arteries that supply the arms and legs with blood. Allow thirty minutes for this exam. There are no restrictions or special instructions.    Follow-Up: At [facility], you and your health needs are our priority.  As part of our continuing mission to provide you with exceptional heart care, we have created designated Provider Care Teams.  These Care Teams include your primary Cardiologist (physician) and Advanced Practice Providers (APPs -  Physician Assistants and Nurse Practitioners) who all work together to provide you with the care you need, when you need it.  We recommend signing up for the patient portal called "MyChart".  Sign up information is provided on this After Visit Summary.  MyChart is used to connect with patients for Virtual Visits (Telemedicine).  Patients are able to view lab/test results, encounter notes, upcoming appointments, etc.  Non-urgent messages can be sent to your provider as well.   To learn more about what you can do with MyChart, go to [email or web address].    Your next appointment:   4-5 month(s)  The format for your next appointment:   In Person  Provider:   Dorris Carnes,  MD   Other Instructions Thank you for choosing [facility]!

## 2020-03-10 NOTE — Progress Notes (Signed)
Cardiology Office Note   Date:  03/10/2020   ID:  Jeremy Lopez, DOB 1950-11-30, MRN [id number]  PCP:  Asencion Noble, MD  Cardiologist:   Dorris Carnes, MD    Patient presents for f/u of CAD    History of Present Illness: Jeremy Lopez is a 69 y.o. male with a history of  CAD   He is s/p PTCA/DES in 2010   Pt had a stress test in 2010  I saw him back in Feb 2021 The pt's biggest complaint is some discomfort in legs   Does note some swelling (says he is avoiding salt).  Plans to start working out again Denies CP    Does note some days with PVCs   Not every day  No dizziness  More a nuisance BP is labile 130s to 150s      Current Meds  Medication Sig  . aspirin 81 MG tablet Take 81 mg by mouth daily.   Marland Kitchen CARTIA XT 240 MG 24 hr capsule TAKE ONE CAPSULE (240 MG TOTAL) BY MOUTHDAILY.  . metoprolol succinate (TOPROL-XL) 50 MG 24 hr tablet TAKE ONE TABLET (50 MG TOTAL) BY MOUTH DAILY.  . rosuvastatin (CRESTOR) 40 MG tablet TAKE ONE (1) TABLET BY MOUTH EVERY DAY  . VITAMIN D PO Take by mouth.     Allergies:   Cephalexin   Past Medical History:  Diagnosis Date  . CAD (coronary artery disease), native coronary artery    DES proximal LAD 07/2009; scattered 20-40% lesions; normal LVEF  . Essential hypertension   . Hyperlipidemia   . Small cell lung cancer ([place])    Right upper lobe 2008; no evidence of disease since treatment ended in 11/2007    Past Surgical History:  Procedure Laterality Date  . Insertion of left subclavian Port-A-Cath  2008  . Video bronchoscopy and video mediastinoscopy  2008     Social History:  The patient  reports that he quit smoking about 21 years ago. His smoking use included cigarettes. He started smoking about 53 years ago. He has never used smokeless tobacco. He reports current alcohol use of about 14.0 standard drinks of alcohol per week. He reports that he does not use drugs.   Family History:  The patient's family history includes CAD in his  brother; Cancer in his father; Hypertension in his brother.    ROS:  Please see the history of present illness. All other systems are reviewed and  Negative to the above problem except as noted.    PHYSICAL EXAM: VS:  BP 134/66   Pulse 76   Ht 5\' 10"  (1.778 m)   Wt 229 lb 6.4 oz (104.1 kg)   SpO2 96%   BMI 32.92 kg/m   GEN: Well nourished, well developed, in no acute distress  HEENT: normal  Neck: no JVD, carotid bruits, or masses Cardiac: RRR; no murmurs, rubs, or gallops,no edema  Respiratory:  clear to auscultation bilaterally, normal work of breathing GI: soft, nontender, nondistended, + BS  No hepatomegaly  MS: no deformity Moving all extremities   Skin: warm and dry,  Sl erythema Neuro:  Strength and sensation are intact Psych: euthymic mood, full affect   EKG:  EKG is not ordered today.   Lipid Panel    Component Value Date/Time   CHOL 128 12/28/2019 1501   CHOL 120 01/04/2016 1611   TRIG 175 (H) 12/28/2019 1501   HDL 42 12/28/2019 1501   HDL 47 01/04/2016 1611  CHOLHDL 3.0 12/28/2019 1501   VLDL 13 12/27/2017 1045   LDLCALC 61 12/28/2019 1501      Wt Readings from Last 3 Encounters:  03/10/20 229 lb 6.4 oz (104.1 kg)  11/16/19 225 lb (102.1 kg)  12/27/17 220 lb (99.8 kg)      ASSESSMENT AND PLAN: 1  LE discomfort   Unclear ?  Claudication   Would set up for LE arterial study  2 CAD  No anginal symtpoms   Follow   3  HTN BP is labile   Not bad today but will be in 150s   Will review Dr Ria Comment records  Will reassess in fall  Encouraged activeity/wt loss  4  Hx PVCs   Pt continues to have intermittently   About 1x ervery 2 wks will have prolonged  Told him he could take an extra metoprolol on those days    5 HL  Lipid panel in march LDL 61  HDL 42  Trig 175  Keep on same meds    6  COVID  Fully vaccinated      Current medicines are reviewed at length with the patient today.  The patient does not have concerns regarding  medicines.  Signed, Dorris Carnes, MD  03/10/2020 1:05 PM     [facility] [place], [place], [place]  [address] Phone: [phone]; Fax: [phone]

## 2020-03-16 ENCOUNTER — Ambulatory Visit (HOSPITAL_COMMUNITY)
Admission: RE | Admit: 2020-03-16 | Discharge: 2020-03-16 | Disposition: A | Discharge status: Home / Self Care | Payer: Medicare Other | Source: Ambulatory Visit | Attending: Internal Medicine | Admitting: Internal Medicine

## 2020-03-16 ENCOUNTER — Other Ambulatory Visit: Payer: Self-pay

## 2020-03-16 DIAGNOSIS — M79605 Pain in left leg: Secondary | ICD-10-CM | POA: Diagnosis not present

## 2020-03-16 DIAGNOSIS — I739 Peripheral vascular disease, unspecified: Secondary | ICD-10-CM | POA: Insufficient documentation

## 2020-03-17 ENCOUNTER — Telehealth: Payer: Self-pay | Admitting: *Deleted

## 2020-03-17 DIAGNOSIS — M79605 Pain in left leg: Secondary | ICD-10-CM

## 2020-03-17 NOTE — Telephone Encounter (Signed)
-----   Message from Fay Records, MD sent at 03/16/2020  9:27 PM EDT ----- Normal blood flow in vessels to leg  No evid for PAD

## 2020-03-17 NOTE — Telephone Encounter (Signed)
Pt has been notified of ABI results no PAD. Pt thanked me for the good news. Pt states he is thinking then he may need a referral to Neurology for further assessment. Pt would like to ask Dr. Harrington Challenger what her opinion is on this and if she agrees would she put in a referral? Pt said he can also d/w PCP if Dr. Harrington Challenger feels that would be better. I assured the pt that I will send message to Dr. Alan Ripper nurse Caren Hazy to discuss further. Pt thanked me for the call and the help. The patient has been notified of the result and verbalized understanding. All questions (if any) were answered. Julaine Hua, [place] 03/17/2020 11:17 AM

## 2020-03-23 NOTE — Telephone Encounter (Signed)
Can refer to [facility]

## 2020-03-24 ENCOUNTER — Encounter: Payer: Self-pay | Admitting: Neurology

## 2020-03-24 NOTE — Telephone Encounter (Signed)
Referral placed for [facility].  Patient notified and appreciative.

## 2020-03-24 NOTE — Addendum Note (Signed)
Addended by: Rodman Key on: 03/24/2020 03:14 PM   Modules accepted: Orders

## 2020-03-24 NOTE — Telephone Encounter (Signed)
PT compalined of Leg pain

## 2020-04-08 ENCOUNTER — Telehealth: Payer: Self-pay | Admitting: Internal Medicine

## 2020-04-08 ENCOUNTER — Encounter: Payer: Self-pay | Admitting: Internal Medicine

## 2020-04-08 DIAGNOSIS — R0789 Other chest pain: Secondary | ICD-10-CM

## 2020-04-08 NOTE — Telephone Encounter (Signed)
Lattie Haw calling from Dr. Ria Comment office stating Dr. Willey Blade would like to speak with Dr. Harrington Challenger. She states the office phone number can be called after 2:00pm or to call Dr. Ria Comment cell phone. Routed his number to the nurse.

## 2020-04-08 NOTE — Telephone Encounter (Signed)
Dr Harrington Challenger called and spoke with Dr Willey Blade.

## 2020-04-09 NOTE — Telephone Encounter (Signed)
Spoke to Dr Willey Blade  Informed that pt complained of chest pressure I spoke to pt  He has had a couple spells of chest pressure   none in a week   But, he has cut back on activity  Would recomm Strss myovue at [facility] to r/o ischemia

## 2020-04-09 NOTE — Telephone Encounter (Signed)
See accompanying note for stress test

## 2020-04-12 NOTE — Addendum Note (Signed)
Addended by: Drue Novel I on: 04/12/2020 08:39 AM   Modules accepted: Orders

## 2020-04-12 NOTE — Telephone Encounter (Signed)
Called and spoke to patient and made him aware that Dr. Harrington Challenger would like for him to have a stress myoview. Instructions reviewed with patient and he is aware that [facility] will contact him to schedule.

## 2020-04-13 ENCOUNTER — Telehealth (HOSPITAL_COMMUNITY): Payer: Self-pay

## 2020-04-13 NOTE — Telephone Encounter (Signed)
Spoke with the patient, detailed instructions left with the patient. He stated that he understood and would be here for his test. Asked to call back with any questions. S.Kel Senn EMTP

## 2020-04-14 ENCOUNTER — Other Ambulatory Visit: Payer: Self-pay

## 2020-04-14 ENCOUNTER — Ambulatory Visit (HOSPITAL_COMMUNITY): Payer: Medicare Other | Attending: Cardiovascular Disease

## 2020-04-14 DIAGNOSIS — R0789 Other chest pain: Secondary | ICD-10-CM | POA: Diagnosis not present

## 2020-04-14 LAB — MYOCARDIAL PERFUSION IMAGING
Estimated workload: 7 METS
Exercise duration (min): 5 min
Exercise duration (sec): 23 s
LV dias vol: 70 mL (ref 62–150)
LV sys vol: 22 mL
MPHR: 152 {beats}/min
Peak HR: 129 {beats}/min
Percent HR: 85 %
Rest HR: 74 {beats}/min
SDS: 1
SRS: 1
SSS: 2
TID: 0.91

## 2020-04-14 MED ORDER — TECHNETIUM TC 99M TETROFOSMIN IV KIT
10.8000 | PACK | Freq: Once | INTRAVENOUS | Status: AC | PRN
  Administered 2020-04-14: 10.8 via INTRAVENOUS
  Filled 2020-04-14: qty 11

## 2020-04-14 MED ORDER — TECHNETIUM TC 99M TETROFOSMIN IV KIT
31.7000 | PACK | Freq: Once | INTRAVENOUS | Status: AC | PRN
  Administered 2020-04-14: 31.7 via INTRAVENOUS
  Filled 2020-04-14: qty 32

## 2020-04-14 MED ORDER — REGADENOSON 0.4 MG/5ML IV SOLN: 0.4000 mg | Freq: Once | INTRAVENOUS | Status: DC

## 2020-04-15 ENCOUNTER — Telehealth: Payer: Self-pay | Admitting: *Deleted

## 2020-04-15 DIAGNOSIS — R0602 Shortness of breath: Secondary | ICD-10-CM

## 2020-04-15 DIAGNOSIS — R0789 Other chest pain: Secondary | ICD-10-CM

## 2020-04-15 NOTE — Telephone Encounter (Signed)
Fay Records, [facility] Triage Cc: Jeremy Noble, MD Stress test looks OK  No ischemia  No evidence of scar  LV function is normal  Called pt to review  Says he gets weak when he walks   Last night had some tightness in chest  Got out of breath walking stairs.   He is anxious about this and he has not been before   Would set up for ct chest with and without contrast to look at lung architecture and also to r/o PE.  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    Plan for [name] if possible as he lives there    [name] has been contacted  Will await call to schedule

## 2020-04-15 NOTE — Telephone Encounter (Signed)
Double checked order with CT department - CT chest angio with and without. R/o PE ordered to be completed at [facility].

## 2020-04-19 ENCOUNTER — Telehealth: Payer: Self-pay | Admitting: Internal Medicine

## 2020-04-19 ENCOUNTER — Other Ambulatory Visit (HOSPITAL_COMMUNITY)
Admission: RE | Admit: 2020-04-19 | Discharge: 2020-04-19 | Disposition: A | Discharge status: Home / Self Care | Payer: Medicare Other | Source: Ambulatory Visit | Attending: Internal Medicine | Admitting: Internal Medicine

## 2020-04-19 DIAGNOSIS — Z01818 Encounter for other preprocedural examination: Secondary | ICD-10-CM

## 2020-04-19 LAB — BASIC METABOLIC PANEL
Anion gap: 11 (ref 5–15)
BUN: 17 mg/dL (ref 8–23)
CO2: 25 mmol/L (ref 22–32)
Calcium: 8.9 mg/dL (ref 8.9–10.3)
Chloride: 100 mmol/L (ref 98–111)
Creatinine, Ser: 1.1 mg/dL (ref 0.61–1.24)
GFR calc Af Amer: >60 mL/min (ref >60)
GFR calc non Af Amer: >60 mL/min (ref >60)
Glucose, Bld: 104 mg/dL — ABNORMAL HIGH (ref 70–99)
Potassium: 4.1 mmol/L (ref 3.5–5.1)
Sodium: 136 mmol/L (ref 135–145)

## 2020-04-19 NOTE — Telephone Encounter (Signed)
Pt scheduled for 7/15 at [facility]

## 2020-04-19 NOTE — Telephone Encounter (Signed)
Order placed for [facility], pt to come today

## 2020-04-19 NOTE — Telephone Encounter (Signed)
Pt states he has gone to [facility]. Neither place has orders Pt states he has an upcoming CT   Please call [phone]   Thanks renee

## 2020-04-21 ENCOUNTER — Ambulatory Visit (HOSPITAL_COMMUNITY)
Admission: RE | Admit: 2020-04-21 | Discharge: 2020-04-21 | Disposition: A | Discharge status: Home / Self Care | Payer: Medicare Other | Source: Ambulatory Visit | Attending: Internal Medicine | Admitting: Internal Medicine

## 2020-04-21 ENCOUNTER — Ambulatory Visit (INDEPENDENT_AMBULATORY_CARE_PROVIDER_SITE_OTHER): Payer: Medicare Other | Admitting: Internal Medicine

## 2020-04-21 ENCOUNTER — Encounter: Payer: Self-pay | Admitting: *Deleted

## 2020-04-21 ENCOUNTER — Encounter: Payer: Self-pay | Admitting: Internal Medicine

## 2020-04-21 ENCOUNTER — Other Ambulatory Visit: Payer: Self-pay

## 2020-04-21 VITALS — BP 128/82 | HR 73 | Ht 70.0 in | Wt 225.0 lb

## 2020-04-21 DIAGNOSIS — R0602 Shortness of breath: Secondary | ICD-10-CM | POA: Diagnosis not present

## 2020-04-21 DIAGNOSIS — R0789 Other chest pain: Secondary | ICD-10-CM | POA: Diagnosis not present

## 2020-04-21 MED ORDER — IOHEXOL 350 MG/ML SOLN
75.0000 mL | Freq: Once | INTRAVENOUS | Status: AC | PRN
  Administered 2020-04-21: 75 mL via INTRAVENOUS

## 2020-04-21 NOTE — Patient Instructions (Addendum)
Medication Instructions:   Your physician recommends that you continue on your current medications as directed. Please refer to the Current Medication list given to you today.  *If you need a refill on your cardiac medications before your next appointment, please call your pharmacy*   Lab Work: CBC TODAY   If you have labs (blood work) drawn today and your tests are completely normal, you will receive your results only by: Marland Kitchen MyChart Message (if you have MyChart) OR . A paper copy in the mail If you have any lab test that is abnormal or we need to change your treatment, we will call you to review the results.   Testing/Procedures:  SEE ATTACHED LETTER Your physician has requested that you have a cardiac catheterization. Cardiac catheterization is used to diagnose and/or treat various heart conditions. Doctors may recommend this procedure for a number of different reasons. The most common reason is to evaluate chest pain. Chest pain can be a symptom of coronary artery disease (CAD), and cardiac catheterization can show whether plaque is narrowing or blocking your heart's arteries. This procedure is also used to evaluate the valves, as well as measure the blood flow and oxygen levels in different parts of your heart. For further information please visit [email or web address]. Please follow instruction sheet, as given.   Follow-Up: At [facility], you and your health needs are our priority.  As part of our continuing mission to provide you with exceptional heart care, we have created designated Provider Care Teams.  These Care Teams include your primary Cardiologist (physician) and Advanced Practice Providers (APPs -  Physician Assistants and Nurse Practitioners) who all work together to provide you with the care you need, when you need it.  We recommend signing up for the patient portal called "MyChart".  Sign up information is provided on this After Visit Summary.  MyChart is used to connect with  patients for Virtual Visits (Telemedicine).  Patients are able to view lab/test results, encounter notes, upcoming appointments, etc.  Non-urgent messages can be sent to your provider as well.   To learn more about what you can do with MyChart, go to [email or web address].    Your next appointment:    BASED UP RESULTS OF CATHERIZATION    Other Instructions

## 2020-04-21 NOTE — H&P (View-Only) (Signed)
Cardiology Office Note   Date:  04/21/2020   ID:  Jeremy Lopez, DOB Jul 14, 1951, MRN [id number]  PCP:  Asencion Noble, MD  Cardiologist:   Dorris Carnes, MD    Patient presents for f/u of CAD    History of Present Illness: Jeremy Lopez is a 69 y.o. male with a history of  CAD   He is s/p PTCA/DES in 2010   Pt had a stress test in 2010  I saw him back in Feb 2021 The pt's biggest complaint is some discomfort in legs   Does note some swelling (says he is avoiding salt).  Plans to start working out again Denies CP    Does note some days with PVCs   Not every day  No dizziness  More a nuisance BP is labile 130s to 150s   I saw the pt earlier this summer (June, 2021)   He was doing OK at that time   On July 2,  I received a call from Dr Willey Blade who ad just seen the pt   Said the pt complained of chest pressure and with his cardiac hx was concerned.   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 aspirin 81 MG tablet Take 81 mg by mouth daily.   Marland Kitchen CARTIA XT 240 MG 24 hr capsule TAKE ONE CAPSULE (240 MG TOTAL) BY MOUTHDAILY.  . metoprolol succinate (TOPROL-XL) 50 MG 24 hr tablet TAKE ONE TABLET (50 MG TOTAL) BY MOUTH DAILY.  . rosuvastatin (CRESTOR) 40 MG tablet TAKE ONE (1) TABLET BY MOUTH EVERY DAY  . VITAMIN D PO Take by mouth.     Allergies:   Cephalexin   Past Medical History:  Diagnosis Date  . CAD (coronary artery disease), native coronary  artery    DES proximal LAD 07/2009; scattered 20-40% lesions; normal LVEF  . Essential hypertension   . Hyperlipidemia   . Small cell lung cancer ([place])    Right upper lobe 2008; no evidence of disease since treatment ended in 11/2007    Past Surgical History:  Procedure Laterality Date  . Insertion of left subclavian Port-A-Cath  2008  . Video bronchoscopy and video mediastinoscopy  2008     Social History:  The patient  reports that he quit smoking about 21 years ago. His smoking use included cigarettes. He started smoking about 53 years ago. He has never used smokeless tobacco. He reports current alcohol use of about 14.0 standard drinks of alcohol per week. He reports that he does not use drugs.   Family History:  The patient's family history includes CAD in his brother; Cancer in his father; Hypertension in his brother.    ROS:  Please see the history  of present illness. All other systems are reviewed and  Negative to the above problem except as noted.    PHYSICAL EXAM: VS:  BP 128/82   Pulse 73   Ht 5\' 10"  (1.778 m)   Wt 225 lb (102.1 kg)   BMI 32.28 kg/m   GEN: Obese 69 yo  in no acute distress  HEENT: normal  Neck: no JVD, carotid bruits Cardiac: RRR; no murmurs;  Trivial LE edema  Respiratory:  Mild rhonchi   Moving air   GI: soft, nontender, nondistended, + BS  No hepatomegaly  MS: no deformity Moving all extremities   Skin: warm and dry,  Sl erythema Neuro:  Strength and sensation are intact Psych: euthymic mood, full affect   EKG:  EKG is not ordered today.   Lipid Panel    Component Value Date/Time   CHOL 128 12/28/2019 1501   CHOL 120 01/04/2016 1611   TRIG 175 (H) 12/28/2019 1501   HDL 42 12/28/2019 1501   HDL 47 01/04/2016 1611   CHOLHDL 3.0 12/28/2019 1501   VLDL 13 12/27/2017 1045   LDLCALC 61 12/28/2019 1501      Wt Readings from Last 3 Encounters:  04/21/20 225 lb (102.1 kg)  04/14/20 229 lb (103.9 kg)  03/10/20 229 lb 6.4 oz (104.1 kg)       ASSESSMENT AND PLAN: 1  Dyspnea/chest tightness   Pt has noted over the past few wks   Sympotms occur with activity, go away when sits down  Myovue showed no ischemia     CT of chest showed no PE, R sided fibrosis  No other changes    The pt continues to have symptoms     I told him a myovue is not 100% accurate   To confirm would need cardiac catheterization to define anatomy   Would also recomm R heart cath to determine filling pressures   Pt has had in past    He would like to proceed so that he can get to feeling better    Will Get CBC  BMET OK  Plan for next week at [place] [facility]    2  CAD   As above     3  HTN  BP is OK today     4  Hx PVCs   Pt continues to have intermittently   About 1x ervery 2 wks will have prolonged  Told him he could take an extra metoprolol on those days     5  LE pain   LE dopplers OK   6 HL  Lipid panel in march LDL 61  HDL 42  Trig 175  Keep on same meds    7  COVID  Fully vaccinated   F/U based on cath results   Pt also follows with Dr Willey Blade       Current medicines are reviewed at length with the patient today.  The patient does not have concerns regarding medicines.  Signed, Dorris Carnes, MD  04/21/2020 2:55 PM     [address], [place], [place]  [address] Phone: [phone]; Fax: [phone]

## 2020-04-21 NOTE — Progress Notes (Signed)
Cardiology Office Note   Date:  04/21/2020   ID:  Jeremy Lopez, DOB 25-Jul-1951, MRN [id number]  PCP:  Asencion Noble, MD  Cardiologist:   Dorris Carnes, MD    Patient presents for f/u of CAD    History of Present Illness: Jeremy Lopez is a 69 y.o. male with a history of  CAD   He is s/p PTCA/DES in 2010   Pt had a stress test in 2010  I saw him back in Feb 2021 The pt's biggest complaint is some discomfort in legs   Does note some swelling (says he is avoiding salt).  Plans to start working out again Denies CP    Does note some days with PVCs   Not every day  No dizziness  More a nuisance BP is labile 130s to 150s   I saw the pt earlier this summer (June, 2021)   He was doing OK at that time   On July 2,  I received a call from Dr Willey Blade who ad just seen the pt   Said the pt complained of chest pressure and with his cardiac hx was concerned.   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 aspirin 81 MG tablet Take 81 mg by mouth daily.   Marland Kitchen CARTIA XT 240 MG 24 hr capsule TAKE ONE CAPSULE (240 MG TOTAL) BY MOUTHDAILY.  . metoprolol succinate (TOPROL-XL) 50 MG 24 hr tablet TAKE ONE TABLET (50 MG TOTAL) BY MOUTH DAILY.  . rosuvastatin (CRESTOR) 40 MG tablet TAKE ONE (1) TABLET BY MOUTH EVERY DAY  . VITAMIN D PO Take by mouth.     Allergies:   Cephalexin   Past Medical History:  Diagnosis Date  . CAD (coronary artery disease), native coronary  artery    DES proximal LAD 07/2009; scattered 20-40% lesions; normal LVEF  . Essential hypertension   . Hyperlipidemia   . Small cell lung cancer ([place])    Right upper lobe 2008; no evidence of disease since treatment ended in 11/2007    Past Surgical History:  Procedure Laterality Date  . Insertion of left subclavian Port-A-Cath  2008  . Video bronchoscopy and video mediastinoscopy  2008     Social History:  The patient  reports that he quit smoking about 21 years ago. His smoking use included cigarettes. He started smoking about 53 years ago. He has never used smokeless tobacco. He reports current alcohol use of about 14.0 standard drinks of alcohol per week. He reports that he does not use drugs.   Family History:  The patient's family history includes CAD in his brother; Cancer in his father; Hypertension in his brother.    ROS:  Please see the history  of present illness. All other systems are reviewed and  Negative to the above problem except as noted.    PHYSICAL EXAM: VS:  BP 128/82   Pulse 73   Ht 5\' 10"  (1.778 m)   Wt 225 lb (102.1 kg)   BMI 32.28 kg/m   GEN: Obese 69 yo  in no acute distress  HEENT: normal  Neck: no JVD, carotid bruits Cardiac: RRR; no murmurs;  Trivial LE edema  Respiratory:  Mild rhonchi   Moving air   GI: soft, nontender, nondistended, + BS  No hepatomegaly  MS: no deformity Moving all extremities   Skin: warm and dry,  Sl erythema Neuro:  Strength and sensation are intact Psych: euthymic mood, full affect   EKG:  EKG is not ordered today.   Lipid Panel    Component Value Date/Time   CHOL 128 12/28/2019 1501   CHOL 120 01/04/2016 1611   TRIG 175 (H) 12/28/2019 1501   HDL 42 12/28/2019 1501   HDL 47 01/04/2016 1611   CHOLHDL 3.0 12/28/2019 1501   VLDL 13 12/27/2017 1045   LDLCALC 61 12/28/2019 1501      Wt Readings from Last 3 Encounters:  04/21/20 225 lb (102.1 kg)  04/14/20 229 lb (103.9 kg)  03/10/20 229 lb 6.4 oz (104.1 kg)       ASSESSMENT AND PLAN: 1  Dyspnea/chest tightness   Pt has noted over the past few wks   Sympotms occur with activity, go away when sits down  Myovue showed no ischemia     CT of chest showed no PE, R sided fibrosis  No other changes    The pt continues to have symptoms     I told him a myovue is not 100% accurate   To confirm would need cardiac catheterization to define anatomy   Would also recomm R heart cath to determine filling pressures   Pt has had in past    He would like to proceed so that he can get to feeling better    Will Get CBC  BMET OK  Plan for next week at [facility]    2  CAD   As above     3  HTN  BP is OK today     4  Hx PVCs   Pt continues to have intermittently   About 1x ervery 2 wks will have prolonged  Told him he could take an extra metoprolol on those days     5  LE pain   LE dopplers OK   6 HL  Lipid panel in march LDL 61  HDL 42  Trig 175  Keep on same meds    7  COVID  Fully vaccinated   F/U based on cath results   Pt also follows with Dr Willey Blade       Current medicines are reviewed at length with the patient today.  The patient does not have concerns regarding medicines.  Signed, Dorris Carnes, MD  04/21/2020 2:55 PM     [address], [place], [place]  [address] Phone: [phone]; Fax: [phone]

## 2020-04-22 ENCOUNTER — Other Ambulatory Visit: Payer: Self-pay | Admitting: Internal Medicine

## 2020-04-22 ENCOUNTER — Telehealth: Payer: Self-pay | Admitting: Cardiology

## 2020-04-22 DIAGNOSIS — R0602 Shortness of breath: Secondary | ICD-10-CM | POA: Diagnosis not present

## 2020-04-22 DIAGNOSIS — R0789 Other chest pain: Secondary | ICD-10-CM | POA: Diagnosis not present

## 2020-04-22 LAB — CBC WITH DIFFERENTIAL/PLATELET
Absolute Monocytes: 662 cells/uL (ref 200–950)
Basophils Absolute: 62 cells/uL (ref 0–200)
Basophils Relative: 0.8 %
Eosinophils Absolute: 131 cells/uL (ref 15–500)
Eosinophils Relative: 1.7 %
HCT: 44.6 % (ref 38.5–50.0)
Hemoglobin: 14.5 g/dL (ref 13.2–17.1)
Lymphs Abs: 1632 cells/uL (ref 850–3900)
MCH: 29.5 pg (ref 27.0–33.0)
MCHC: 32.5 g/dL (ref 32.0–36.0)
MCV: 90.7 fL (ref 80.0–100.0)
MPV: 8.7 fL (ref 7.5–12.5)
Monocytes Relative: 8.6 %
Neutro Abs: 5213 cells/uL (ref 1500–7800)
Neutrophils Relative %: 67.7 %
Platelets: 385 10*3/uL (ref 140–400)
RBC: 4.92 10*6/uL (ref 4.20–5.80)
RDW: 12.7 % (ref 11.0–15.0)
Total Lymphocyte: 21.2 %
WBC: 7.7 10*3/uL (ref 3.8–10.8)

## 2020-04-22 NOTE — Telephone Encounter (Signed)
New message    Patient is scheduled for heart cath on Monday , he has questions about the procedure please call to go over instructions

## 2020-04-22 NOTE — Telephone Encounter (Signed)
I spoke with patient, all questions explained.He states he has had a cath before and he was appeasing his children.

## 2020-04-25 ENCOUNTER — Telehealth: Payer: Self-pay | Admitting: *Deleted

## 2020-04-25 NOTE — Telephone Encounter (Signed)
Pt contacted pre-catheterization scheduled at [facility] for: Tuesday April 26, 2020 7:30 AM Verified arrival time and place: [facility]) at: 5:30 AM   No solid food after midnight prior to cath, clear liquids until 5 AM day of procedure.  AM meds can be  taken pre-cath with sips of water including: ASA 81 mg   Confirmed patient has responsible adult to drive home post procedure and observe 24 hours after arriving home: yes  You are allowed ONE visitor in the waiting room during your procedure. Both you and your visitor must wear a mask once you enter the hospital.      COVID-19 Pre-Screening Questions:   In the past 14 days have you had a new cough associated with shortness of breath, fever (100.4 or greater) or sudden loss of taste or sense of smell? no  In the past 14 days have you been around anyone with known Covid 19? no  International travel in the past 14 days? no  Have you been vaccinated for COVID-19? Yes, see immunization history   Reviewed procedure/mask/visitor instructions, COVID-19 questions with patient.

## 2020-04-26 ENCOUNTER — Ambulatory Visit (HOSPITAL_COMMUNITY)
Admission: RE | Admit: 2020-04-26 | Discharge: 2020-04-26 | Disposition: A | Discharge status: Home / Self Care | Payer: Medicare Other | Attending: Interventional Cardiology | Admitting: Interventional Cardiology

## 2020-04-26 ENCOUNTER — Other Ambulatory Visit: Payer: Self-pay

## 2020-04-26 ENCOUNTER — Encounter (HOSPITAL_COMMUNITY)
Admission: RE | Disposition: A | Discharge status: Home / Self Care | Payer: Self-pay | Source: Home / Self Care | Attending: Interventional Cardiology

## 2020-04-26 DIAGNOSIS — Z79899 Other long term (current) drug therapy: Secondary | ICD-10-CM | POA: Diagnosis not present

## 2020-04-26 DIAGNOSIS — Z881 Allergy status to other antibiotic agents status: Secondary | ICD-10-CM | POA: Diagnosis not present

## 2020-04-26 DIAGNOSIS — E782 Mixed hyperlipidemia: Secondary | ICD-10-CM | POA: Insufficient documentation

## 2020-04-26 DIAGNOSIS — I25118 Atherosclerotic heart disease of native coronary artery with other forms of angina pectoris: Secondary | ICD-10-CM | POA: Diagnosis not present

## 2020-04-26 DIAGNOSIS — R0600 Dyspnea, unspecified: Secondary | ICD-10-CM | POA: Insufficient documentation

## 2020-04-26 DIAGNOSIS — Z8249 Family history of ischemic heart disease and other diseases of the circulatory system: Secondary | ICD-10-CM | POA: Diagnosis not present

## 2020-04-26 DIAGNOSIS — Z85118 Personal history of other malignant neoplasm of bronchus and lung: Secondary | ICD-10-CM | POA: Insufficient documentation

## 2020-04-26 DIAGNOSIS — M79662 Pain in left lower leg: Secondary | ICD-10-CM | POA: Diagnosis not present

## 2020-04-26 DIAGNOSIS — I2510 Atherosclerotic heart disease of native coronary artery without angina pectoris: Secondary | ICD-10-CM | POA: Diagnosis present

## 2020-04-26 DIAGNOSIS — R0609 Other forms of dyspnea: Secondary | ICD-10-CM

## 2020-04-26 DIAGNOSIS — Z95820 Peripheral vascular angioplasty status with implants and grafts: Secondary | ICD-10-CM

## 2020-04-26 DIAGNOSIS — Z955 Presence of coronary angioplasty implant and graft: Secondary | ICD-10-CM | POA: Diagnosis not present

## 2020-04-26 DIAGNOSIS — I2584 Coronary atherosclerosis due to calcified coronary lesion: Secondary | ICD-10-CM | POA: Diagnosis not present

## 2020-04-26 DIAGNOSIS — Z87891 Personal history of nicotine dependence: Secondary | ICD-10-CM | POA: Diagnosis not present

## 2020-04-26 DIAGNOSIS — I10 Essential (primary) hypertension: Secondary | ICD-10-CM | POA: Diagnosis not present

## 2020-04-26 DIAGNOSIS — Z7982 Long term (current) use of aspirin: Secondary | ICD-10-CM | POA: Insufficient documentation

## 2020-04-26 HISTORY — PX: TEMPORARY PACEMAKER: CATH118268

## 2020-04-26 HISTORY — PX: RIGHT/LEFT HEART CATH AND CORONARY ANGIOGRAPHY: CATH118266

## 2020-04-26 HISTORY — PX: CORONARY ULTRASOUND/IVUS: CATH118244

## 2020-04-26 HISTORY — PX: CORONARY ATHERECTOMY: CATH118238

## 2020-04-26 HISTORY — PX: CORONARY STENT INTERVENTION: CATH118234

## 2020-04-26 LAB — POCT I-STAT EG7
Acid-Base Excess: 0 mmol/L (ref 0.0–2.0)
Bicarbonate: 25.1 mmol/L (ref 20.0–28.0)
Calcium, Ion: 1.19 mmol/L (ref 1.15–1.40)
HCT: 45 % (ref 39.0–52.0)
Hemoglobin: 15.3 g/dL (ref 13.0–17.0)
O2 Saturation: 94 %
Potassium: 3.8 mmol/L (ref 3.5–5.1)
Sodium: 143 mmol/L (ref 135–145)
TCO2: 26 mmol/L (ref 22–32)
pCO2, Ven: 40.4 mmHg — ABNORMAL LOW (ref 44.0–60.0)
pH, Ven: 7.401 (ref 7.250–7.430)
pO2, Ven: 73 mmHg — ABNORMAL HIGH (ref 32.0–45.0)

## 2020-04-26 LAB — POCT I-STAT 7, (LYTES, BLD GAS, ICA,H+H)
Acid-Base Excess: 1 mmol/L (ref 0.0–2.0)
Bicarbonate: 27 mmol/L (ref 20.0–28.0)
Calcium, Ion: 1.21 mmol/L (ref 1.15–1.40)
HCT: 45 % (ref 39.0–52.0)
Hemoglobin: 15.3 g/dL (ref 13.0–17.0)
O2 Saturation: 72 %
Potassium: 3.7 mmol/L (ref 3.5–5.1)
Sodium: 143 mmol/L (ref 135–145)
TCO2: 28 mmol/L (ref 22–32)
pCO2 arterial: 45.1 mmHg (ref 32.0–48.0)
pH, Arterial: 7.386 (ref 7.350–7.450)
pO2, Arterial: 39 mmHg — CL (ref 83.0–108.0)

## 2020-04-26 LAB — POCT ACTIVATED CLOTTING TIME
Activated Clotting Time: 268 seconds
Activated Clotting Time: 346 seconds
Activated Clotting Time: 400 seconds

## 2020-04-26 SURGERY — RIGHT/LEFT HEART CATH AND CORONARY ANGIOGRAPHY
Anesthesia: LOCAL

## 2020-04-26 MED ORDER — ROSUVASTATIN CALCIUM 20 MG PO TABS: 40.0000 mg | ORAL_TABLET | Freq: Every day | ORAL | Status: DC

## 2020-04-26 MED ORDER — MIDAZOLAM HCL 2 MG/2ML IJ SOLN
INTRAMUSCULAR | Status: AC
  Filled 2020-04-26: qty 2

## 2020-04-26 MED ORDER — ACETAMINOPHEN 325 MG PO TABS
650.0000 mg | ORAL_TABLET | ORAL | Status: DC | PRN
  Administered 2020-04-26: 650 mg via ORAL
  Filled 2020-04-26: qty 2

## 2020-04-26 MED ORDER — SODIUM CHLORIDE 0.9% FLUSH: 3.0000 mL | INTRAVENOUS | Status: DC | PRN

## 2020-04-26 MED ORDER — CLOPIDOGREL BISULFATE 300 MG PO TABS
ORAL_TABLET | ORAL | Status: DC | PRN
  Administered 2020-04-26: 600 mg via ORAL

## 2020-04-26 MED ORDER — HYDRALAZINE HCL 20 MG/ML IJ SOLN: 10.0000 mg | INTRAMUSCULAR | Status: DC | PRN

## 2020-04-26 MED ORDER — VERAPAMIL HCL 2.5 MG/ML IV SOLN
INTRAVENOUS | Status: DC | PRN
  Administered 2020-04-26: 10 mL via INTRA_ARTERIAL

## 2020-04-26 MED ORDER — METOPROLOL SUCCINATE ER 50 MG PO TB24: 50.0000 mg | ORAL_TABLET | Freq: Every day | ORAL | Status: DC

## 2020-04-26 MED ORDER — CLOPIDOGREL BISULFATE 75 MG PO TABS: 75.0000 mg | ORAL_TABLET | Freq: Every day | ORAL | 6 refills | Status: DC

## 2020-04-26 MED ORDER — SODIUM CHLORIDE 0.9 % IV SOLN: 250.0000 mL | INTRAVENOUS | Status: DC | PRN

## 2020-04-26 MED ORDER — CLOPIDOGREL BISULFATE 300 MG PO TABS
ORAL_TABLET | ORAL | Status: AC
  Filled 2020-04-26: qty 1

## 2020-04-26 MED ORDER — ASPIRIN 81 MG PO CHEW: 81.0000 mg | CHEWABLE_TABLET | Freq: Every day | ORAL | Status: DC

## 2020-04-26 MED ORDER — NITROGLYCERIN 1 MG/10 ML FOR IR/CATH LAB
INTRA_ARTERIAL | Status: DC | PRN
  Administered 2020-04-26 (×3): 200 ug
  Administered 2020-04-26: 200 ug via INTRA_ARTERIAL

## 2020-04-26 MED ORDER — HEPARIN (PORCINE) IN NACL 1000-0.9 UT/500ML-% IV SOLN
INTRAVENOUS | Status: DC | PRN
  Administered 2020-04-26 (×2): 500 mL

## 2020-04-26 MED ORDER — MIDAZOLAM HCL 2 MG/2ML IJ SOLN
INTRAMUSCULAR | Status: DC | PRN
  Administered 2020-04-26: 1 mg via INTRAVENOUS
  Administered 2020-04-26: 2 mg via INTRAVENOUS
  Administered 2020-04-26: 1 mg via INTRAVENOUS

## 2020-04-26 MED ORDER — FAMOTIDINE IN NACL 20-0.9 MG/50ML-% IV SOLN
INTRAVENOUS | Status: AC
  Filled 2020-04-26: qty 50

## 2020-04-26 MED ORDER — ASPIRIN 81 MG PO TABS: 81.0000 mg | ORAL_TABLET | Freq: Every day | ORAL | Status: DC

## 2020-04-26 MED ORDER — DILTIAZEM HCL ER COATED BEADS 240 MG PO CP24: 240.0000 mg | ORAL_CAPSULE | Freq: Every day | ORAL | Status: DC

## 2020-04-26 MED ORDER — ASPIRIN 81 MG PO CHEW: 81.0000 mg | CHEWABLE_TABLET | ORAL | Status: DC

## 2020-04-26 MED ORDER — CLOPIDOGREL BISULFATE 75 MG PO TABS: 75.0000 mg | ORAL_TABLET | Freq: Every day | ORAL | Status: DC

## 2020-04-26 MED ORDER — IOHEXOL 350 MG/ML SOLN
INTRAVENOUS | Status: DC | PRN
  Administered 2020-04-26: 103 mL

## 2020-04-26 MED ORDER — IOHEXOL 350 MG/ML SOLN
INTRAVENOUS | Status: AC
  Filled 2020-04-26: qty 1

## 2020-04-26 MED ORDER — ATROPINE SULFATE 1 MG/10ML IJ SOSY
PREFILLED_SYRINGE | INTRAMUSCULAR | Status: AC
  Filled 2020-04-26: qty 10

## 2020-04-26 MED ORDER — NITROGLYCERIN 0.4 MG SL SUBL
0.4000 mg | SUBLINGUAL_TABLET | SUBLINGUAL | 2 refills | Status: DC | PRN
Start: 2020-04-26 — End: 2022-06-12

## 2020-04-26 MED ORDER — FENTANYL CITRATE (PF) 100 MCG/2ML IJ SOLN
INTRAMUSCULAR | Status: AC
  Filled 2020-04-26: qty 2

## 2020-04-26 MED ORDER — FAMOTIDINE IN NACL 20-0.9 MG/50ML-% IV SOLN
INTRAVENOUS | Status: DC | PRN
  Administered 2020-04-26: 20 mg via INTRAVENOUS

## 2020-04-26 MED ORDER — HEPARIN (PORCINE) IN NACL 1000-0.9 UT/500ML-% IV SOLN
INTRAVENOUS | Status: AC
  Filled 2020-04-26: qty 500

## 2020-04-26 MED ORDER — LIDOCAINE HCL (PF) 1 % IJ SOLN
INTRAMUSCULAR | Status: AC
  Filled 2020-04-26: qty 30

## 2020-04-26 MED ORDER — FENTANYL CITRATE (PF) 100 MCG/2ML IJ SOLN
INTRAMUSCULAR | Status: DC | PRN
  Administered 2020-04-26 (×3): 25 ug via INTRAVENOUS

## 2020-04-26 MED ORDER — SODIUM CHLORIDE 0.9 % WEIGHT BASED INFUSION
3.0000 mL/kg/h | INTRAVENOUS | Status: DC
  Administered 2020-04-26: 3 mL/kg/h via INTRAVENOUS

## 2020-04-26 MED ORDER — SODIUM CHLORIDE 0.9 % WEIGHT BASED INFUSION
1.0000 mL/kg/h | INTRAVENOUS | Status: DC
  Administered 2020-04-26: 1 mL/kg/h via INTRAVENOUS

## 2020-04-26 MED ORDER — ATROPINE SULFATE 1 MG/10ML IJ SOSY
PREFILLED_SYRINGE | INTRAMUSCULAR | Status: DC | PRN
  Administered 2020-04-26: 0.5 mg via INTRAVENOUS

## 2020-04-26 MED ORDER — LIDOCAINE HCL (PF) 1 % IJ SOLN
INTRAMUSCULAR | Status: DC | PRN
  Administered 2020-04-26 (×2): 2 mL

## 2020-04-26 MED ORDER — FLUTICASONE PROPIONATE 50 MCG/ACT NA SUSP: 2.0000 | Freq: Every day | NASAL | Status: DC | PRN

## 2020-04-26 MED ORDER — HEPARIN SODIUM (PORCINE) 1000 UNIT/ML IJ SOLN
INTRAMUSCULAR | Status: DC | PRN
  Administered 2020-04-26: 2000 [IU] via INTRAVENOUS
  Administered 2020-04-26: 6000 [IU] via INTRAVENOUS
  Administered 2020-04-26: 3000 [IU] via INTRAVENOUS
  Administered 2020-04-26: 5000 [IU] via INTRAVENOUS

## 2020-04-26 MED ORDER — LABETALOL HCL 5 MG/ML IV SOLN: 10.0000 mg | INTRAVENOUS | Status: DC | PRN

## 2020-04-26 MED ORDER — NITROGLYCERIN 1 MG/10 ML FOR IR/CATH LAB
INTRA_ARTERIAL | Status: AC
  Filled 2020-04-26: qty 10

## 2020-04-26 MED ORDER — VITAMIN D 50 MCG (2000 UT) PO TABS: 2000.0000 [IU] | ORAL_TABLET | Freq: Every day | ORAL | Status: DC

## 2020-04-26 MED ORDER — ALBUTEROL SULFATE HFA 108 (90 BASE) MCG/ACT IN AERS: 2.0000 | INHALATION_SPRAY | Freq: Four times a day (QID) | RESPIRATORY_TRACT | Status: DC | PRN

## 2020-04-26 MED ORDER — SODIUM CHLORIDE 0.9 % IV SOLN: INTRAVENOUS | Status: AC

## 2020-04-26 MED ORDER — ONDANSETRON HCL 4 MG/2ML IJ SOLN: 4.0000 mg | Freq: Four times a day (QID) | INTRAMUSCULAR | Status: DC | PRN

## 2020-04-26 MED ORDER — SODIUM CHLORIDE 0.9% FLUSH: 3.0000 mL | Freq: Two times a day (BID) | INTRAVENOUS | Status: DC

## 2020-04-26 MED ORDER — VERAPAMIL HCL 2.5 MG/ML IV SOLN
INTRAVENOUS | Status: AC
  Filled 2020-04-26: qty 2

## 2020-04-26 MED FILL — CLOPIDOGREL 75 MG TABLET: 75 | 30 days supply | Qty: 30 | Fill #0

## 2020-04-26 MED FILL — NITROGLYCERIN 0.4 MG TAB SL: 0.4 | 7 days supply | Qty: 25 | Fill #0

## 2020-04-26 SURGICAL SUPPLY — 28 items
BALLN SAPPHIRE 3.0X12 (BALLOONS) ×2
BALLN SAPPHIRE ~~LOC~~ 3.75X8 (BALLOONS) ×1 IMPLANT
BALLOON SAPPHIRE 3.0X12 (BALLOONS) IMPLANT
CABLE ADAPT PACING TEMP 12FT (ADAPTER) ×1 IMPLANT
CATH 5FR JL3.5 JR4 ANG PIG MP (CATHETERS) ×1 IMPLANT
CATH OPTICROSS HD (CATHETERS) ×1 IMPLANT
CATH S G BIP PACING (CATHETERS) ×1 IMPLANT
CATH SWAN DBL LUMAN 5F 110 (CATHETERS) ×1 IMPLANT
CATH VISTA GUIDE 6FR XBRCA (CATHETERS) ×1 IMPLANT
CATH VISTA GUIDE 6FR XBRCA SH (CATHETERS) IMPLANT
CROWN DIAMONDBACK CLASSIC 1.25 (BURR) ×1 IMPLANT
DEVICE RAD COMP TR BAND LRG (VASCULAR PRODUCTS) ×1 IMPLANT
ELECT DEFIB PAD ADLT CADENCE (PAD) ×1 IMPLANT
GLIDESHEATH SLEND A-KIT 6F 22G (SHEATH) ×1 IMPLANT
GLIDESHEATH SLEND SS 6F .021 (SHEATH) ×2 IMPLANT
GUIDEWIRE INQWIRE 1.5J.035X260 (WIRE) IMPLANT
INQWIRE 1.5J .035X260CM (WIRE) ×2
KIT ENCORE 26 ADVANTAGE (KITS) ×1 IMPLANT
KIT HEART LEFT (KITS) ×2 IMPLANT
KIT HEMO VALVE WATCHDOG (MISCELLANEOUS) ×1 IMPLANT
LUBRICANT VIPERSLIDE CORONARY (MISCELLANEOUS) ×1 IMPLANT
PACK CARDIAC CATHETERIZATION (CUSTOM PROCEDURE TRAY) ×2 IMPLANT
SLED PULL BACK IVUS (MISCELLANEOUS) ×1 IMPLANT
STENT RESOLUTE ONYX 3.5X15 (Permanent Stent) ×1 IMPLANT
TRANSDUCER W/STOPCOCK (MISCELLANEOUS) ×2 IMPLANT
TUBING CIL FLEX 10 FLL-RA (TUBING) ×2 IMPLANT
WIRE ASAHI PROWATER 180CM (WIRE) ×1 IMPLANT
WIRE VIPERWIRE COR FLEX .012 (WIRE) ×1 IMPLANT

## 2020-04-26 NOTE — Discharge Summary (Addendum)
Discharge Summary for Same Day PCI   Patient ID: Jeremy Lopez MRN: [id number]; DOB: [birth year]  Admit date: 04/26/2020 Discharge date: 04/26/2020  Primary Care Provider: Asencion Noble, MD  Primary Cardiologist: Dorris Carnes, MD  Primary Electrophysiologist:  None   Discharge Diagnoses    Principal Problem:   DOE (dyspnea on exertion) Active Problems:   CAD (coronary artery disease), native coronary artery   Mixed hyperlipidemia   Essential hypertension    Diagnostic Studies/Procedures    Cardiac Catheterization 04/26/2020:  Mid RCA lesion is 99% stenosed, heavily calcified.  Following atherectomy, A drug-eluting stent was successfully placed using a STENT RESOLUTE ONYX 3.5X15, postdilated to 3.75 mm; stent optimized with IVUS.  Post intervention, there is a 0% residual stenosis.  Mid RCA to Dist RCA lesion is 40% stenosed.  Previously placed Prox LAD stent, is widely patent.  The left ventricular systolic function is normal.  LV end diastolic pressure is normal.  The left ventricular ejection fraction is 55-65% by visual estimate.  There is no aortic valve stenosis.   Continue aggressive secondary prevention.  Still eligible for same-day discharge.  Consider Plavix monotherapy given his diffuse disease after DAPT is completed.  Diagnostic Dominance: Right  Intervention      History of Present Illness     Jeremy Lopez is a 69 y.o. male with a history of CAD s/p PTCA/DES in 2010, hypertension, hyperlipidemia, and small cell lung cancer diagnosed in 2008 and currently in remission who is followed by Dr. Harrington Challenger. Patient PCP (Dr. Willey Blade) called Dr. Harrington Challenger earlier this morning because patient was complaining of chest pressure. Dr. Harrington Challenger ordered [facility] which was done on 04/14/2020 and showed no evidence of ischemia. Dr. Harrington Challenger spoke with patient on the phone after this at which time he reported shortness of breath and some chest tightness with activity. Given history  of radiation fibrosis of the lungs, CT was order to reassess this and rule-out PE. Chest CTA on 04/21/2020 negative for PE but did show chronic paramediastinal fibrosis on the right consistent with radiation fibrosis. Patient was also seen by Dr. Harrington Challenger on 04/21/2020 at which time he reported continued shortness of breath with walking as well as some bilateral arm tightness. Right/left cardiac catheterization was arranged for further evaluation.  Hospital Course     The patient underwent outpatient right/left cardiac cath as noted above which showed 99% heavily calcified stenosis of mid RCA which was treated with atherectomy and DES. Also 40% stenosis of mid to distal RCA. Prior stent to LAD widely patent. LVEDP normal. Right heart pressures: Aortic saturation 94%, PA saturation 72%, PA pressure 27/11, mean PA pressure 18 mmHg, mean PCWP 8 mm Hg; CO 6.7 L/min  CI 3.07.  Patient Plan for dual antiplatelet therapy with Aspirin 81mg  daily and Plavix 75mg  daily for at least 12 months. He was seen by Cardiac Rehab while in short stay. There were no observed complications post cath. Right radial cath site was re-evaluated prior to discharge and found to be stable without any complications.Instructions/precautions regarding cath site care were given prior to discharge.  Chrystie Nose Fisher was seen by Dr. Irish Lack and determined stable for discharge home. Follow up with our office has been arranged. Medications are listed below. Pertinent changes include addition of Plavix.  _____________  Cath/PCI Registry Performance & Quality Measures: 1. Aspirin prescribed? - Yes 2. ADP Receptor Inhibitor (Plavix/Clopidogrel, Brilinta/Ticagrelor or Effient/Prasugrel) prescribed (includes medically managed patients)? - Yes 3. High Intensity Statin (Lipitor 40-80mg  or Crestor  20-40mg ) prescribed? - Yes 4. For EF <40%, was ACEI/ARB prescribed? - Not Applicable (EF >/= 21%) 5. For EF <40%, Aldosterone Antagonist (Spironolactone or  Eplerenone) prescribed? - Not Applicable (EF >/= 19%) 6. Cardiac Rehab Phase II ordered (Included Medically managed Patients)? - Yes  _____________   Discharge Vitals Blood pressure (!) 148/74, pulse 63, resp. rate 16, SpO2 95 %.  There were no vitals filed for this visit.  Last Labs & Radiologic Studies    CBC No results for input(s): WBC, NEUTROABS, HGB, HCT, MCV, PLT in the last 72 hours. Basic Metabolic Panel No results for input(s): NA, K, CL, CO2, GLUCOSE, BUN, CREATININE, CALCIUM, MG, PHOS in the last 72 hours. Liver Function Tests No results for input(s): AST, ALT, ALKPHOS, BILITOT, PROT, ALBUMIN in the last 72 hours. No results for input(s): LIPASE, AMYLASE in the last 72 hours. High Sensitivity Troponin:   No results for input(s): TROPONINIHS in the last 720 hours.  BNP Invalid input(s): POCBNP D-Dimer No results for input(s): DDIMER in the last 72 hours. Hemoglobin A1C No results for input(s): HGBA1C in the last 72 hours. Fasting Lipid Panel No results for input(s): CHOL, HDL, LDLCALC, TRIG, CHOLHDL, LDLDIRECT in the last 72 hours. Thyroid Function Tests No results for input(s): TSH, T4TOTAL, T3FREE, THYROIDAB in the last 72 hours.  Invalid input(s): FREET3 _____________  CT ANGIO CHEST PE W OR WO CONTRAST  Result Date: 04/21/2020 CLINICAL DATA:  Shortness of breath. History of pulmonary fibrosis. Assess for pulmonary emboli. EXAM: CT ANGIOGRAPHY CHEST WITH CONTRAST TECHNIQUE: Multidetector CT imaging of the chest was performed using the standard protocol during bolus administration of intravenous contrast. Multiplanar CT image reconstructions and MIPs were obtained to evaluate the vascular anatomy. CONTRAST:  18mL OMNIPAQUE IOHEXOL 350 MG/ML SOLN COMPARISON:  10/17/2017 FINDINGS: Cardiovascular: Pulmonary arterial opacification is good. No pulmonary emboli. Heart size is normal. Coronary artery calcification is noted. There is aortic atherosclerosis without aneurysm or  dissection. Port-A-Cath in place. Mediastinum/Nodes: No mass or lymphadenopathy. Lungs/Pleura: Chronic paramediastinal scarring on the right consistent with radiation fibrosis. No sign of active infiltrate, mass, collapse or effusion. Upper Abdomen: Normal Musculoskeletal: Ordinary mild degenerative changes of the thoracic spine. Review of the MIP images confirms the above findings. IMPRESSION: No pulmonary emboli. 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 Aortic Atherosclerosis (ICD10-I70.0). Coronary artery calcification. Electronically Signed   By: Nelson Chimes M.D.   On: 04/21/2020 14:05   CARDIAC CATHETERIZATION  Result Date: 04/26/2020  Mid RCA lesion is 99% stenosed, heavily calcified.  Following atherectomy, A drug-eluting stent was successfully placed using a STENT RESOLUTE ONYX 3.5X15, postdilated to 3.75 mm; stent optimized with IVUS.  Post intervention, there is a 0% residual stenosis.  Mid RCA to Dist RCA lesion is 40% stenosed.  Previously placed Prox LAD stent, is widely patent.  The left ventricular systolic function is normal.  LV end diastolic pressure is normal.  The left ventricular ejection fraction is 55-65% by visual estimate.  There is no aortic valve stenosis.  Continue aggressive secondary prevention.  Still eligible for same-day discharge. Consider Plavix monotherapy given his diffuse disease after DAPT is completed.   MYOCARDIAL PERFUSION IMAGING  Result Date: 04/14/2020  Nuclear stress EF: 68%.  There was no ST segment deviation noted during stress.  No T wave inversion was noted during stress.  The study is normal.  This is a low risk study.  The left ventricular ejection fraction is hyperdynamic (>65%).  1. Reduced counts in  the inferior wall that improve on stress imaging with normal wall motion consistent with diaphragm attenuation. 2. No ischemia or infarction. 3. Normal LVEF, >65%. 4. Average exercise capacity (5:23  min:s; 7 METS). 5. Normal HR/BP response to exercise. 6. This is a low-risk study.    Disposition   Patient is being discharged home today in good condition.  Follow-up Plans & Appointments     Follow-up Information    Verta Ellen., NP Follow up.   Specialty: Cardiology Why: Follow-up scheduled for 05/09/2020 at 9:00am with Katina Dung, one of our office's NPs. Please arrive 15 minutes early for check-in. If this date/time does not work for you, please call our office to reschedule. Contact information: [place] [address] [phone]              Discharge Instructions    Amb Referral to Cardiac Rehabilitation   Complete by: As directed    Diagnosis: Coronary Stents   After initial evaluation and assessments completed: Virtual Based Care may be provided alone or in conjunction with Phase 2 Cardiac Rehab based on patient barriers.: Yes       Discharge Medications   Allergies as of 04/26/2020      Reactions   Cephalexin Anaphylaxis   throat swelling      Medication List    TAKE these medications   albuterol 108 (90 Base) MCG/ACT inhaler Commonly known as: VENTOLIN HFA Inhale 2 puffs into the lungs every 6 (six) hours as needed for wheezing or shortness of breath.   aspirin 81 MG tablet Take 81 mg by mouth daily.   Cartia XT 240 MG 24 hr capsule Generic drug: diltiazem TAKE ONE CAPSULE (240 MG TOTAL) BY MOUTHDAILY. What changed: See the new instructions.   clopidogrel 75 MG tablet Commonly known as: PLAVIX Take 1 tablet (75 mg total) by mouth daily with breakfast. Start taking on: April 27, 2020   fluticasone 50 MCG/ACT nasal spray Commonly known as: FLONASE Place 2 sprays into both nostrils daily as needed for allergies or rhinitis.   metoprolol succinate 50 MG 24 hr tablet Commonly known as: TOPROL-XL TAKE ONE TABLET (50 MG TOTAL) BY MOUTH DAILY. What changed: See the new instructions.   nitroGLYCERIN 0.4 MG SL  tablet Commonly known as: Nitrostat Place 1 tablet (0.4 mg total) under the tongue every 5 (five) minutes as needed for chest pain.   rosuvastatin 40 MG tablet Commonly known as: CRESTOR TAKE ONE (1) TABLET BY MOUTH EVERY DAY What changed: See the new instructions.   Vitamin D 50 MCG (2000 UT) tablet Take 2,000 Units by mouth daily.          Allergies Allergies  Allergen Reactions  . Cephalexin Anaphylaxis    throat swelling    Outstanding Labs/Studies   N/A.  Duration of Discharge Encounter   Greater than 30 minutes including physician time.  Signed, Darreld Mclean, PA-C 04/26/2020, 3:46 PM   I have examined the patient and reviewed assessment and plan and discussed with patient.  Agree with above as stated.    Did well with atherectomy/PCI.  COntinue DAPT.  CONtinue secondary prevention. Radial site without hematoma.  OK for same day discharge.  Larae Grooms

## 2020-04-26 NOTE — Research (Signed)
PHDEV Informed Consent   Subject Name: Jeremy Lopez  Subject met inclusion and exclusion criteria.  The informed consent form, study requirements and expectations were reviewed with the subject and questions and concerns were addressed prior to the signing of the consent form.  The subject verbalized understanding of the trail requirements.  The subject agreed to participate in the [facility] trial and signed the informed consent.  The informed consent was obtained prior to performance of any protocol-specific procedures for the subject.  A copy of the signed informed consent was given to the subject and a copy was placed in the subject's medical record.  Philemon Kingdom D 04/26/2020, [phone]

## 2020-04-26 NOTE — Interval H&P Note (Signed)
Cath Lab Visit (complete for each Cath Lab visit)  Clinical Evaluation Leading to the Procedure:   ACS: No.  Non-ACS:    Anginal Classification: CCS III  Anti-ischemic medical therapy: Minimal Therapy (1 class of medications)  Non-Invasive Test Results: Low-risk stress test findings: cardiac mortality <1%/year  Prior CABG: No previous CABG      History and Physical Interval Note:  04/26/2020 7:38 AM  Jeremy Lopez  has presented today for surgery, with the diagnosis of chest pressure.  The various methods of treatment have been discussed with the patient and family. After consideration of risks, benefits and other options for treatment, the patient has consented to  Procedure(s): RIGHT/LEFT HEART CATH AND CORONARY ANGIOGRAPHY (N/A) as a surgical intervention.  The patient's history has been reviewed, patient examined, no change in status, stable for surgery.  I have reviewed the patient's chart and labs.  Questions were answered to the patient's satisfaction.     Larae Grooms

## 2020-04-26 NOTE — Discharge Instructions (Signed)
Drink plenty of fluids  Keep right arm at or above heart level  Radial Site Care  This sheet gives you information about how to care for yourself after your procedure. Your health care provider may also give you more specific instructions. If you have problems or questions, contact your health care provider. What can I expect after the procedure? After the procedure, it is common to have:  Bruising and tenderness at the catheter insertion area. Follow these instructions at home: Medicines  Take over-the-counter and prescription medicines only as told by your health care provider. Insertion site care  Follow instructions from your health care provider about how to take care of your insertion site. Make sure you: ? Wash your hands with soap and water before you change your bandage (dressing). If soap and water are not available, use hand sanitizer. ? Change your dressing as told by your health care provider. ? Leave stitches (sutures), skin glue, or adhesive strips in place. These skin closures may need to stay in place for 2 weeks or longer. If adhesive strip edges start to loosen and curl up, you may trim the loose edges. Do not remove adhesive strips completely unless your health care provider tells you to do that.  Check your insertion site every day for signs of infection. Check for: ? Redness, swelling, or pain. ? Fluid or blood. ? Pus or a bad smell. ? Warmth.  Do not take baths, swim, or use a hot tub until your health care provider approves.  You may shower 24-48 hours after the procedure, or as directed by your health care provider. ? Remove the dressing and gently wash the site with plain soap and water. ? Pat the area dry with a clean towel. ? Do not rub the site. That could cause bleeding.  Do not apply powder or lotion to the site. Activity   For 24 hours after the procedure, or as directed by your health care provider: ? Do not flex or bend the affected arm. ? Do  not push or pull heavy objects with the affected arm. ? Do not drive yourself home from the hospital or clinic. You may drive 24 hours after the procedure unless your health care provider tells you not to. ? Do not operate machinery or power tools.  Do not lift anything that is heavier than 10 lb (4.5 kg), or the limit that you are told, until your health care provider says that it is safe.  Ask your health care provider when it is okay to: ? Return to work or school. ? Resume usual physical activities or sports. ? Resume sexual activity. General instructions  If the catheter site starts to bleed, raise your arm and put firm pressure on the site. If the bleeding does not stop, get help right away. This is a medical emergency.  If you went home on the same day as your procedure, a responsible adult should be with you for the first 24 hours after you arrive home.  Keep all follow-up visits as told by your health care provider. This is important. Contact a health care provider if:  You have a fever.  You have redness, swelling, or yellow drainage around your insertion site. Get help right away if:  You have unusual pain at the radial site.  The catheter insertion area swells very fast.  The insertion area is bleeding, and the bleeding does not stop when you hold steady pressure on the area.  Your arm or  hand becomes pale, cool, tingly, or numb. These symptoms may represent a serious problem that is an emergency. Do not wait to see if the symptoms will go away. Get medical help right away. Call your local emergency services (911 in the [place] Do not drive yourself to the hospital. Summary  After the procedure, it is common to have bruising and tenderness at the site.  Follow instructions from your health care provider about how to take care of your radial site wound. Check the wound every day for signs of infection.  Do not lift anything that is heavier than 10 lb (4.5 kg), or the  limit that you are told, until your health care provider says that it is safe. This information is not intended to replace advice given to you by your health care provider. Make sure you discuss any questions you have with your health care provider. Document Revised: 10/30/2017 Document Reviewed: 10/30/2017 Elsevier Patient Education  2020 [place] about your medication: Plavix (anti-platelet agent)  Generic Name (Brand): clopidogrel (Plavix), once daily medication  PURPOSE: You are taking this medication along with aspirin to lower your chance of having a heart attack, stroke, or blood clots in your heart stent. These can be fatal. Plavix and aspirin help prevent platelets from sticking together and forming a clot that can block an artery or your stent.   Common SIDE EFFECTS you may experience include: bruising or bleeding more easily, shortness of breath  Do not stop taking PLAVIX without talking to the doctor who prescribes it for you. People who are treated with a stent and stop taking Plavix too soon, have a higher risk of getting a blood clot in the stent, having a heart attack, or dying. If you stop Plavix because of bleeding, or for other reasons, your risk of a heart attack or stroke may increase.   Tell all of your doctors and dentists that you are taking Plavix. They should talk to the doctor who prescribed plavix for you before you have any surgery or invasive procedure.   Contact your health care provider if you experience: severe or uncontrollable bleeding, pink/red/brown urine, vomiting blood or vomit that looks like "coffee grounds", red or black stools (looks like tar), coughing up blood or blood clots ----------------------------------------------------------------------------------------------------------------------

## 2020-04-26 NOTE — Progress Notes (Signed)
1330-1400 Education completed with pt who voiced understanding. Stressed importance of plavix with stent. Reviewed NTG use, gave heart healthy diet and walking instructions for ex. Discussed CRP 2 and referred to [facility] program. Graylon Good RN BSN 04/26/2020 1:56 PM

## 2020-04-27 ENCOUNTER — Encounter (HOSPITAL_COMMUNITY): Payer: Self-pay | Admitting: Interventional Cardiology

## 2020-05-08 NOTE — Progress Notes (Signed)
Cardiology Office Note  Date: 05/09/2020   ID: Jeremy Lopez, DOB 07-07-1951, MRN [id number]  PCP:  Asencion Noble, MD  Cardiologist:  Dorris Carnes, MD Electrophysiologist:  None   Chief Complaint: Follow-up DOE, cardiac catheterization  History of Present Illness: Jeremy Lopez is a 69 y.o. male with a history of CAD, mixed hyperlipidemia, essential hypertension, DOE, small cell lung CA 2008.  Cardiac catheterization on 04/26/2020.  Patient had called his primary cardiologist Dr. Harrington Challenger earlier that day and was complaining of chest pressure.  Recent Lexiscan Myoview done on 04/14/2020 showed no evidence of ischemia.  When  following with Dr. Harrington Challenger he complained of some shortness of breath and chest tightness with activity.  A CT of the chest was ordered to rule out PE.  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  Later saw Dr. Harrington Challenger on 7/15 at which time he reported continued shortness of breath with walking and bilateral arm tightness.  Cardiac catheterization was ordered.  Underwent cardiac  catheterization on 04/26/2020 showed 99% heavily calcified stenosis of the mid RCA which was treated with atherectomy and DES.  40% stenosis of mid to distal RCA.  Prior stent to LAD widely patent LV function normal, right heart pressures: Aortic saturation 94%, PA saturation 73%, PA pressures 27/11, mean PA pressure 18 mmHg, PCWP 8 mmHg.  He was started on dual antiplatelet therapy with aspirin and Plavix for least 12 months.  Seen by cardiac rehab in short study.   Today he presents with no particular complaints status post recent cardiac catheterization.  He denies any anginal or exertional symptoms, palpitations or arrhythmias, orthostatic symptoms, stroke or TIA-like symptoms, PND, orthopnea, bleeding, claudication, or lower extremity edema.  States he is feeling a little depressed but he knows the reason for it.  Blood pressure is  elevated today at 156/90.  He states at home he gets systolic pressures between 130-150.   Past Medical History:  Diagnosis Date  . CAD (coronary artery disease), native coronary artery    DES proximal LAD 07/2009; scattered 20-40% lesions; normal LVEF  . Essential hypertension   . Hyperlipidemia   . Small cell lung cancer ([place])    Right upper lobe 2008; no evidence of disease since treatment ended in 11/2007    Past Surgical History:  Procedure Laterality Date  . CORONARY ATHERECTOMY N/A 04/26/2020   Procedure: CORONARY ATHERECTOMY;  Surgeon: Jettie Booze, MD;  Location: [place];  Service: Cardiovascular;  Laterality: N/A;  . CORONARY STENT INTERVENTION N/A 04/26/2020   Procedure: CORONARY STENT INTERVENTION;  Surgeon: Jettie Booze, MD;  Location: [place] [facility];  Service: Cardiovascular;  Laterality: N/A;  . Insertion of left subclavian Port-A-Cath  2008  . INTRAVASCULAR ULTRASOUND/IVUS N/A 04/26/2020   Procedure: Intravascular Ultrasound/IVUS;  Surgeon: Jettie Booze, MD;  Location: [place] [facility];  Service: Cardiovascular;  Laterality: N/A;  . RIGHT/LEFT HEART CATH AND CORONARY ANGIOGRAPHY N/A 04/26/2020   Procedure: RIGHT/LEFT HEART CATH AND CORONARY ANGIOGRAPHY;  Surgeon: Jettie Booze, MD;  Location: [place] [facility];  Service: Cardiovascular;  Laterality: N/A;  . TEMPORARY PACEMAKER N/A 04/26/2020   Procedure: TEMPORARY PACEMAKER;  Surgeon: Jettie Booze, MD;  Location: [place] [facility];  Service: Cardiovascular;  Laterality: N/A;  . Video bronchoscopy and video mediastinoscopy  2008    Current Outpatient Medications  Medication Sig Dispense Refill  . albuterol (VENTOLIN HFA)  108 (90 Base) MCG/ACT inhaler Inhale 2 puffs into the lungs every 6 (six) hours as needed for wheezing or shortness of breath.    Marland Kitchen aspirin 81 MG tablet Take 81 mg by mouth daily.     Marland Kitchen CARTIA XT 240 MG 24 hr capsule TAKE ONE CAPSULE (240 MG TOTAL) BY  MOUTHDAILY. 90 capsule 3  . Cholecalciferol (VITAMIN D) 50 MCG (2000 UT) tablet Take 2,000 Units by mouth daily.    . clopidogrel (PLAVIX) 75 MG tablet Take 1 tablet (75 mg total) by mouth daily with breakfast. 30 tablet 6  . fluticasone (FLONASE) 50 MCG/ACT nasal spray Place 2 sprays into both nostrils daily as needed for allergies or rhinitis.    . metoprolol succinate (TOPROL-XL) 50 MG 24 hr tablet TAKE ONE TABLET (50 MG TOTAL) BY MOUTH DAILY. 90 tablet 3  . nitroGLYCERIN (NITROSTAT) 0.4 MG SL tablet Place 1 tablet (0.4 mg total) under the tongue every 5 (five) minutes as needed for chest pain. 25 tablet 2  . rosuvastatin (CRESTOR) 40 MG tablet TAKE ONE (1) TABLET BY MOUTH EVERY DAY 90 tablet 3   No current facility-administered medications for this visit.   Facility-Administered Medications Ordered in Other Visits  Medication Dose Route Frequency Provider Last Rate Last Admin  . regadenoson (LEXISCAN) injection SOLN 0.4 mg  0.4 mg Intravenous Once O'Neal, Cassie Freer, MD       Allergies:  Cephalexin   Social History: The patient  reports that he quit smoking about 21 years ago. His smoking use included cigarettes. He started smoking about 53 years ago. He has never used smokeless tobacco. He reports current alcohol use of about 14.0 standard drinks of alcohol per week. He reports that he does not use drugs.   Family History: The patient's family history includes CAD in his brother; Cancer in his father; Hypertension in his brother.   ROS:  Please see the history of present illness. Otherwise, complete review of systems is positive for none.  All other systems are reviewed and negative.   Physical Exam: VS:  BP (!) 156/90   Pulse 80   Ht 5\' 10"  (1.778 m)   Wt (!) 224 lb 12.8 oz (102 kg)   SpO2 97%   BMI 32.26 kg/m , BMI Body mass index is 32.26 kg/m.  Wt Readings from Last 3 Encounters:  05/09/20 (!) 224 lb 12.8 oz (102 kg)  04/21/20 225 lb (102.1 kg)  04/14/20 229 lb (103.9  kg)    General: Patient appears comfortable at rest. Neck: Supple, no elevated JVP or carotid bruits, no thyromegaly. Lungs: Clear to auscultation, nonlabored breathing at rest. Cardiac: Regular rate and rhythm, no S3 or significant systolic murmur, no pericardial rub. Extremities: No pitting edema, distal pulses 2+. Skin: Warm and dry.  Right radial access site clean and dry with 2+ pulses Musculoskeletal: No kyphosis. Neuropsychiatric: Alert and oriented x3, affect grossly appropriate.  ECG:  EKG 04/26/2020 normal sinus rhythm rate of 72, left axis deviation.  Recent Labwork: 12/28/2019: TSH 2.78 04/19/2020: BUN 17; Creatinine, Ser 1.10 04/22/2020: Platelets 385 04/26/2020: Hemoglobin 15.3; Potassium 3.7; Sodium 143     Component Value Date/Time   CHOL 128 12/28/2019 1501   CHOL 120 01/04/2016 1611   TRIG 175 (H) 12/28/2019 1501   HDL 42 12/28/2019 1501   HDL 47 01/04/2016 1611   CHOLHDL 3.0 12/28/2019 1501   VLDL 13 12/27/2017 1045   LDLCALC 61 12/28/2019 1501    Other Studies Reviewed Today:  Cardiac Catheterization 04/26/2020:  Mid RCA lesion is 99% stenosed, heavily calcified.  Following atherectomy, A drug-eluting stent was successfully placed using a STENT RESOLUTE ONYX 3.5X15, postdilated to 3.75 mm; stent optimized with IVUS.  Post intervention, there is a 0% residual stenosis.  Mid RCA to Dist RCA lesion is 40% stenosed.  Previously placed Prox LAD stent, is widely patent.  The left ventricular systolic function is normal.  LV end diastolic pressure is normal.  The left ventricular ejection fraction is 55-65% by visual estimate.  There is no aortic valve stenosis.  Continue aggressive secondary prevention. Still eligible for same-day discharge.  Consider Plavix monotherapy given his diffuse disease after DAPT is completed.  Diagnostic Dominance: Right  Intervention    Assessment and Plan:  1. CAD in native artery   2. DOE (dyspnea on  exertion)   3. Mixed hyperlipidemia   4. Essential hypertension    1. CAD in native artery Mid RCA heavily calcified RCA 99% with atherectomy and DES, Mid to distal RCA 40%, Prior LAD stent patent. LV fx normal.  Denies any recent progressive anginal or exertional symptoms.  Continue aspirin 81 mg, Plavix 75 mg, nitroglycerin 0.4 mg sublingual as needed for chest pain, Toprol-XL 50 mg daily.   2. DOE (dyspnea on exertion) Recent increased DOE. Hx of Lung CA, Recent CT chest with chronic paramediastinal fibrosis c/w radiation fibrosis.  Today he denies any recent DOE since cardiac catheterization.  States he is feeling much better.  3. Mixed hyperlipidemia He has a follow-up CT in 6 months for examination of pulmonary nodules.   4. Essential hypertension Blood pressure elevated today on arrival 156/90.  Patient states his systolic blood pressure varies at home anywhere from 502-774 systolic.  Continue Cartia XT 1240 mg, Toprol-XL 50 mg.  Add amlodipine 2.5 mg.  May need to titrate up if blood pressure remains elevated.  Medication Adjustments/Labs and Tests Ordered: Current medicines are reviewed at length with the patient today.  Concerns regarding medicines are outlined above.   Disposition: Follow-up with Dr. Harrington Challenger or APP in 6 months. Signed, Levell July, NP 05/09/2020 9:26 AM    [facility] [place], [place], [place] [address] Phone: [phone]; Fax: [phone]

## 2020-05-09 ENCOUNTER — Ambulatory Visit (INDEPENDENT_AMBULATORY_CARE_PROVIDER_SITE_OTHER): Payer: Medicare Other | Admitting: Family Medicine

## 2020-05-09 ENCOUNTER — Encounter: Payer: Self-pay | Admitting: Family Medicine

## 2020-05-09 ENCOUNTER — Other Ambulatory Visit: Payer: Self-pay

## 2020-05-09 VITALS — BP 156/90 | HR 80 | Ht 70.0 in | Wt 224.8 lb

## 2020-05-09 DIAGNOSIS — I2510 Atherosclerotic heart disease of native coronary artery without angina pectoris: Secondary | ICD-10-CM | POA: Diagnosis not present

## 2020-05-09 DIAGNOSIS — R0609 Other forms of dyspnea: Secondary | ICD-10-CM

## 2020-05-09 DIAGNOSIS — E782 Mixed hyperlipidemia: Secondary | ICD-10-CM

## 2020-05-09 DIAGNOSIS — R0600 Dyspnea, unspecified: Secondary | ICD-10-CM

## 2020-05-09 DIAGNOSIS — I10 Essential (primary) hypertension: Secondary | ICD-10-CM | POA: Diagnosis not present

## 2020-05-09 MED ORDER — AMLODIPINE BESYLATE 2.5 MG PO TABS
2.5000 mg | ORAL_TABLET | Freq: Every day | ORAL | 6 refills | Status: DC
Start: 2020-05-09 — End: 2020-10-17

## 2020-05-09 NOTE — Patient Instructions (Addendum)
Medication Instructions:   Begin Amlodipine 2.5mg  daily.  Continue all other medications.    Labwork: none  Testing/Procedures: none   Follow-Up: 6 months   Any Other Special Instructions Will Be Listed Below (If Applicable).  If you need a refill on your cardiac medications before your next appointment, please call your pharmacy.

## 2020-05-24 ENCOUNTER — Ambulatory Visit: Payer: Medicare Other | Admitting: Neurology

## 2020-05-27 ENCOUNTER — Other Ambulatory Visit: Payer: Self-pay | Admitting: Internal Medicine

## 2020-05-27 MED ORDER — CLOPIDOGREL BISULFATE 75 MG PO TABS: 75.0000 mg | ORAL_TABLET | Freq: Every day | ORAL | 11 refills | Status: DC

## 2020-05-27 NOTE — Addendum Note (Signed)
Addended by: Levonne Hubert on: 05/27/2020 03:31 PM   Modules accepted: Orders

## 2020-05-27 NOTE — Telephone Encounter (Signed)
Please call Plavix 75mg  (generic)to [employer]  Pt states he has one pill       Thanks  renee

## 2020-05-27 NOTE — Telephone Encounter (Signed)
Refill complete 

## 2020-07-14 ENCOUNTER — Other Ambulatory Visit: Payer: Self-pay

## 2020-07-14 ENCOUNTER — Encounter (HOSPITAL_COMMUNITY): Payer: Self-pay

## 2020-07-14 ENCOUNTER — Encounter (HOSPITAL_COMMUNITY)
Admission: RE | Admit: 2020-07-14 | Discharge: 2020-07-14 | Disposition: A | Discharge status: Home / Self Care | Payer: Medicare Other | Source: Ambulatory Visit | Attending: Internal Medicine | Admitting: Internal Medicine

## 2020-07-14 VITALS — BP 118/60 | HR 70 | Ht 70.0 in | Wt 222.9 lb

## 2020-07-14 DIAGNOSIS — Z79899 Other long term (current) drug therapy: Secondary | ICD-10-CM | POA: Insufficient documentation

## 2020-07-14 DIAGNOSIS — Z955 Presence of coronary angioplasty implant and graft: Secondary | ICD-10-CM | POA: Diagnosis not present

## 2020-07-14 DIAGNOSIS — Z95820 Peripheral vascular angioplasty status with implants and grafts: Secondary | ICD-10-CM

## 2020-07-14 NOTE — Progress Notes (Signed)
Cardiac/Pulmonary Rehab Medication Review by a Pharmacist  Does the patient  feel that his/her medications are working for him/her?  yes  Has the patient been experiencing any side effects to the medications prescribed?  no  Does the patient measure his/her own blood pressure or blood glucose at home?  yes   Does the patient have any problems obtaining medications due to transportation or finances?   no  Understanding of regimen: good Understanding of indications: good Potential of compliance: excellent  Questions asked to Determine Patient Understanding of Medication Regimen:  1. What is the name of the medication?  2. What is the medication used for?  3. When should it be taken?  4. How much should be taken?  5. How will you take it?  6. What side effects should you report?  Understanding Defined as: Excellent: All questions above are correct Good: Questions 1-4 are correct Fair: Questions 1-2 are correct  Poor: 1 or none of the above questions are correct   Pharmacist comments: Patient presents today for cardiac rehab. He is s/p angioplasty in July 2021.  He is knowledgeable about his medication regimen. Recently, he had his MD add amlodipine2.5mg  to his regimen and also on diltiazem. I instructed him to discuss with cardiology to ensure 2 CCB's were indicated. He monitors his BP and HR occasionally. Recommended to follow more frequently, daily. Otherwise, he says his feels better since angioplasty but wished he had more endurance with his legs. He is tolerating his current regimen without any issues.  Thanks for the opportunity to participate in the care of this patient,   Isac Sarna, BS [place]ena [place], [place] Pharmacist Pager [phone] 07/14/2020 1:30 PM

## 2020-07-14 NOTE — Progress Notes (Signed)
Cardiac Individual Treatment Plan  Patient Details  Name: Jeremy Lopez MRN: [id number] Date of Birth: [birth year] Referring Provider:     CARDIAC REHAB PHASE II ORIENTATION from 07/14/2020 in [place]  Referring Provider Dr. Harrington Challenger      Initial Encounter Date:    CARDIAC REHAB PHASE II ORIENTATION from 07/14/2020 in [place]  Date 07/14/20      Visit Diagnosis: Status post angioplasty with stent  Patient's Home Medications on Admission:  Current Outpatient Medications:  .  albuterol (VENTOLIN HFA) 108 (90 Base) MCG/ACT inhaler, Inhale 2 puffs into the lungs every 6 (six) hours as needed for wheezing or shortness of breath., Disp: , Rfl:  .  amLODipine (NORVASC) 2.5 MG tablet, Take 1 tablet (2.5 mg total) by mouth daily., Disp: 30 tablet, Rfl: 6 .  aspirin 81 MG tablet, Take 81 mg by mouth daily. , Disp: , Rfl:  .  CARTIA XT 240 MG 24 hr capsule, TAKE ONE CAPSULE (240 MG TOTAL) BY MOUTHDAILY., Disp: 90 capsule, Rfl: 3 .  Cholecalciferol (VITAMIN D) 50 MCG (2000 UT) tablet, Take 2,000 Units by mouth daily., Disp: , Rfl:  .  clopidogrel (PLAVIX) 75 MG tablet, Take 1 tablet (75 mg total) by mouth daily with breakfast., Disp: 30 tablet, Rfl: 11 .  fluticasone (FLONASE) 50 MCG/ACT nasal spray, Place 2 sprays into both nostrils daily as needed for allergies or rhinitis., Disp: , Rfl:  .  metoprolol succinate (TOPROL-XL) 50 MG 24 hr tablet, TAKE ONE TABLET (50 MG TOTAL) BY MOUTH DAILY., Disp: 90 tablet, Rfl: 3 .  nitroGLYCERIN (NITROSTAT) 0.4 MG SL tablet, Place 1 tablet (0.4 mg total) under the tongue every 5 (five) minutes as needed for chest pain., Disp: 25 tablet, Rfl: 2 .  rosuvastatin (CRESTOR) 40 MG tablet, TAKE ONE (1) TABLET BY MOUTH EVERY DAY, Disp: 90 tablet, Rfl: 3 No current facility-administered medications for this encounter.  Facility-Administered Medications Ordered in Other Encounters:  .  regadenoson (LEXISCAN) injection SOLN  0.4 mg, 0.4 mg, Intravenous, Once, O'Neal, Cassie Freer, MD  Past Medical History: Past Medical History:  Diagnosis Date  . CAD (coronary artery disease), native coronary artery    DES proximal LAD 07/2009; scattered 20-40% lesions; normal LVEF  . Essential hypertension   . Hyperlipidemia   . Small cell lung cancer ([place])    Right upper lobe 2008; no evidence of disease since treatment ended in 11/2007    Tobacco Use: Social History   Tobacco Use  Smoking Status Former Smoker  . Types: Cigarettes  . Start date: 10/25/1966  . Quit date: 10/25/1998  . Years since quitting: 21.7  Smokeless Tobacco Never Used    Labs: Recent Chemical engineer    Labs for ITP Cardiac and Pulmonary Rehab Latest Ref Rng & Units 01/04/2016 12/27/2017 12/28/2019 04/26/2020 04/26/2020   Cholestrol <200 mg/dL 120 94 128 - -   LDLCALC mg/dL (calc) 51 42 61 - -   HDL > OR = 40 mg/dL 47 39(L) 42 - -   Trlycerides <150 mg/dL 109 64 175(H) - -   Hemoglobin A1c <5.7 % - - - - -   PHART 7.35 - 7.45 - - - - 7.386   PCO2ART 32 - 48 mmHg - - - - 45.1   HCO3 20.0 - 28.0 mmol/L - - - 25.1 27.0   TCO2 22 - 32 mmol/L - - - 26 28   ACIDBASEDEF 0.0 - 2.0 mmol/L - - - - -  O2SAT % - - - 94.0 72.0      Capillary Blood Glucose: No results found for: GLUCAP   Exercise Target Goals: Exercise Program Goal: Individual exercise prescription set using results from initial 6 min walk test and THRR while considering  patient's activity barriers and safety.   Exercise Prescription Goal: Starting with aerobic activity 30 plus minutes a day, 3 days per week for initial exercise prescription. Provide home exercise prescription and guidelines that participant acknowledges understanding prior to discharge.  Activity Barriers & Risk Stratification:   6 Minute Walk:  6 Minute Walk    Row Name 07/14/20 1501         6 Minute Walk   Phase Initial     Distance 1200 feet     Walk Time 6 minutes     # of Rest Breaks 0      MPH 2.27     METS 2.51     RPE 11     VO2 Peak 8.78     Symptoms No     Resting HR 69 bpm     Resting BP 118/60     Resting Oxygen Saturation  96 %     Exercise Oxygen Saturation  during 6 min walk 96 %     Max Ex. HR 86 bpm     Max Ex. BP 146/76     2 Minute Post BP 130/76            Oxygen Initial Assessment:   Oxygen Re-Evaluation:   Oxygen Discharge (Final Oxygen Re-Evaluation):   Initial Exercise Prescription:  Initial Exercise Prescription - 07/14/20 1500      Date of Initial Exercise RX and Referring Provider   Date 07/14/20    Referring Provider Dr. Harrington Challenger    Expected Discharge Date 10/07/20      Treadmill   MPH 1.5    Grade 0    Minutes 17    METs 2.15      NuStep   Level 2    SPM 80    Minutes 22      Prescription Details   Frequency (times per week) 2    Duration Progress to 30 minutes of continuous aerobic without signs/symptoms of physical distress      Intensity   THRR 40-80% of Max Heartrate 60-121    Ratings of Perceived Exertion 11-13    Perceived Dyspnea 0-4      Resistance Training   Training Prescription Yes    Weight 3 lbs    Reps 10-15           Perform Capillary Blood Glucose checks as needed.  Exercise Prescription Changes:   Exercise Comments:   Exercise Goals and Review:   Exercise Goals    Row Name 07/14/20 1505             Exercise Goals   Increase Physical Activity Yes       Intervention Provide advice, education, support and counseling about physical activity/exercise needs.;Develop an individualized exercise prescription for aerobic and resistive training based on initial evaluation findings, risk stratification, comorbidities and participant's personal goals.       Expected Outcomes Short Term: Attend rehab on a regular basis to increase amount of physical activity.;Long Term: Exercising regularly at least 3-5 days a week.;Long Term: Add in home exercise to make exercise part of routine and to increase  amount of physical activity.       Increase Strength and Stamina Yes  Intervention Provide advice, education, support and counseling about physical activity/exercise needs.;Develop an individualized exercise prescription for aerobic and resistive training based on initial evaluation findings, risk stratification, comorbidities and participant's personal goals.       Expected Outcomes Short Term: Increase workloads from initial exercise prescription for resistance, speed, and METs.;Short Term: Perform resistance training exercises routinely during rehab and add in resistance training at home;Long Term: Improve cardiorespiratory fitness, muscular endurance and strength as measured by increased METs and functional capacity (6MWT)       Able to understand and use rate of perceived exertion (RPE) scale Yes       Intervention Provide education and explanation on how to use RPE scale       Expected Outcomes Short Term: Able to use RPE daily in rehab to express subjective intensity level;Long Term:  Able to use RPE to guide intensity level when exercising independently       Knowledge and understanding of Target Heart Rate Range (THRR) Yes       Intervention Provide education and explanation of THRR including how the numbers were predicted and where they are located for reference       Expected Outcomes Short Term: Able to state/look up THRR;Long Term: Able to use THRR to govern intensity when exercising independently;Short Term: Able to use daily as guideline for intensity in rehab       Able to check pulse independently Yes       Intervention Provide education and demonstration on how to check pulse in carotid and radial arteries.;Review the importance of being able to check your own pulse for safety during independent exercise       Expected Outcomes Short Term: Able to explain why pulse checking is important during independent exercise;Long Term: Able to check pulse independently and accurately        Understanding of Exercise Prescription Yes       Intervention Provide education, explanation, and written materials on patient's individual exercise prescription       Expected Outcomes Short Term: Able to explain program exercise prescription;Long Term: Able to explain home exercise prescription to exercise independently              Exercise Goals Re-Evaluation :    Discharge Exercise Prescription (Final Exercise Prescription Changes):   Nutrition:  Target Goals: Understanding of nutrition guidelines, daily intake of sodium 1500mg , cholesterol 200mg , calories 30% from fat and 7% or less from saturated fats, daily to have 5 or more servings of fruits and vegetables.  Biometrics:  Pre Biometrics - 07/14/20 1506      Pre Biometrics   Height 5\' 10"  (1.778 m)    Weight 101.1 kg    Waist Circumference 45.5 inches    Hip Circumference 45.5 inches    Waist to Hip Ratio 1 %    BMI (Calculated) 31.98    Triceps Skinfold 27 mm    % Body Fat 34 %    Grip Strength 38 kg    Flexibility 16.5 in    Single Leg Stand 6.61 seconds            Nutrition Therapy Plan and Nutrition Goals:  Nutrition Therapy & Goals - 07/14/20 1459      Personal Nutrition Goals   Comments Patient scored 19 on his medificts diet assessment. He says he does the cooking and the shopping and is trying to eat heart healthy. Diet assessment score discussed with patient and his wife and handout provided and discussed  regarding making healthier choices. Will continue to monitor.      Intervention Plan   Intervention Nutrition handout(s) given to patient.           Nutrition Assessments:  Nutrition Assessments - 07/14/20 1501      MEDFICTS Scores   Pre Score 19           Nutrition Goals Re-Evaluation:   Nutrition Goals Discharge (Final Nutrition Goals Re-Evaluation):   Psychosocial: Target Goals: Acknowledge presence or absence of significant depression and/or stress, maximize coping skills,  provide positive support system. Participant is able to verbalize types and ability to use techniques and skills needed for reducing stress and depression.  Initial Review & Psychosocial Screening:  Initial Psych Review & Screening - 07/14/20 1503      Initial Review   Current issues with None Identified      Family Dynamics   Good Support System? Yes    Comments Patient lives with his wife of many years. He denies any depression or anxiety. He has children and is getting ready to enjoy a new grandchild. He has a positive outlook regarding his future. Will continue to monitor.      Barriers   Psychosocial barriers to participate in program There are no identifiable barriers or psychosocial needs.;The patient should benefit from training in stress management and relaxation.      Screening Interventions   Interventions Encouraged to exercise           Quality of Life Scores:  Quality of Life - 07/14/20 1501      Quality of Life   Select Quality of Life      Quality of Life Scores   Health/Function Pre 24.4 %    Socioeconomic Pre 28.5 %    Psych/Spiritual Pre 24 %    Family Pre 28.8 %    GLOBAL Pre 25.89 %          Scores of 19 and below usually indicate a poorer quality of life in these areas.  A difference of  2-3 points is a clinically meaningful difference.  A difference of 2-3 points in the total score of the Quality of Life Index has been associated with significant improvement in overall quality of life, self-image, physical symptoms, and general health in studies assessing change in quality of life.  PHQ-9: Recent Review Flowsheet Data    Depression screen [facility] 2/9 07/14/2020   Decreased Interest 0   Down, Depressed, Hopeless 0   PHQ - 2 Score 0   Altered sleeping 0   Tired, decreased energy 1   Change in appetite 0   Feeling bad or failure about yourself  0   Trouble concentrating 0   Moving slowly or fidgety/restless 0   Suicidal thoughts 0   PHQ-9 Score 1    Difficult doing work/chores Not difficult at all     Interpretation of Total Score  Total Score Depression Severity:  1-4 = Minimal depression, 5-9 = Mild depression, 10-14 = Moderate depression, 15-19 = Moderately severe depression, 20-27 = Severe depression   Psychosocial Evaluation and Intervention:  Psychosocial Evaluation - 07/14/20 1504      Psychosocial Evaluation & Interventions   Interventions Encouraged to exercise with the program and follow exercise prescription;Stress management education;Relaxation education    Comments Patient's initial QOL score was 25.89 overall and his PHQ-9 score was 1 with no psychosocial issues identified at his orientation visit.    Expected Outcomes Patient will have no psychosocial issues  identified at discharge.    Continue Psychosocial Services  No Follow up required           Psychosocial Re-Evaluation:   Psychosocial Discharge (Final Psychosocial Re-Evaluation):   Vocational Rehabilitation: Provide vocational rehab assistance to qualifying candidates.   Vocational Rehab Evaluation & Intervention:  Vocational Rehab - 07/14/20 1501      Initial Vocational Rehab Evaluation & Intervention   Assessment shows need for Vocational Rehabilitation No      Vocational Rehab Re-Evaulation   Comments Patient is a retired Emergency planning/management officer. He is not interested in returning to work and does not need vocational rehab.           Education: Education Goals: Education classes will be provided on a weekly basis, covering required topics. Participant will state understanding/return demonstration of topics presented.  Learning Barriers/Preferences:  Learning Barriers/Preferences - 07/14/20 1501      Learning Barriers/Preferences   Learning Barriers None    Learning Preferences Written Material;Video;Audio;Skilled Demonstration           Education Topics: Hypertension, Hypertension Reduction -Define heart disease and high blood pressure.  Discus how high blood pressure affects the body and ways to reduce high blood pressure.   Exercise and Your Heart -Discuss why it is important to exercise, the FITT principles of exercise, normal and abnormal responses to exercise, and how to exercise safely.   Angina -Discuss definition of angina, causes of angina, treatment of angina, and how to decrease risk of having angina.   Cardiac Medications -Review what the following cardiac medications are used for, how they affect the body, and side effects that may occur when taking the medications.  Medications include Aspirin, Beta blockers, calcium channel blockers, ACE Inhibitors, angiotensin receptor blockers, diuretics, digoxin, and antihyperlipidemics.   Congestive Heart Failure -Discuss the definition of CHF, how to live with CHF, the signs and symptoms of CHF, and how keep track of weight and sodium intake.   Heart Disease and Intimacy -Discus the effect sexual activity has on the heart, how changes occur during intimacy as we age, and safety during sexual activity.   Smoking Cessation / COPD -Discuss different methods to quit smoking, the health benefits of quitting smoking, and the definition of COPD.   Nutrition I: Fats -Discuss the types of cholesterol, what cholesterol does to the heart, and how cholesterol levels can be controlled.   Nutrition II: Labels -Discuss the different components of food labels and how to read food label   Heart Parts/Heart Disease and PAD -Discuss the anatomy of the heart, the pathway of blood circulation through the heart, and these are affected by heart disease.   Stress I: Signs and Symptoms -Discuss the causes of stress, how stress may lead to anxiety and depression, and ways to limit stress.   Stress II: Relaxation -Discuss different types of relaxation techniques to limit stress.   Warning Signs of Stroke / TIA -Discuss definition of a stroke, what the signs and symptoms are  of a stroke, and how to identify when someone is having stroke.   Knowledge Questionnaire Score:  Knowledge Questionnaire Score - 07/14/20 1501      Knowledge Questionnaire Score   Pre Score 23/24           Core Components/Risk Factors/Patient Goals at Admission:  Personal Goals and Risk Factors at Admission - 07/14/20 1502      Core Components/Risk Factors/Patient Goals on Admission    Weight Management Obesity    Personal Goal Other  Yes    Personal Goal Increase strength and stamina. Increase strength in his legs especially. Maintain his health.    Intervention Patient will attend CR 2 days/week and supplement with exercise at home 3 days/week.    Expected Outcomes Patient will meet both program and personal goals.           Core Components/Risk Factors/Patient Goals Review:    Core Components/Risk Factors/Patient Goals at Discharge (Final Review):    ITP Comments:   Patient arrived for 1st visit/orientation/education at 1230. Patient was referred to CR by Dorris Carnes, MD due to Status Post angioplasty with stent (L21.747). During orientation advised patient on arrival and appointment times what to wear, what to do before, during and after exercise. Reviewed attendance and class policy. Talked about inclement weather and class consultation policy. Pt is scheduled to return Cardiac Rehab on 07/18/20 at 11:00. Pt was advised to come to class 15 minutes before class starts. Discussed RPE/Dpysnea scales. Patient participated in warm up stretches.  Patient was able to complete 6 minute walk test. Patient was measured for the equipment. Discussed equipment safety with patient. Took patient pre-anthropometric measurements. Patient finished visit at 1415.

## 2020-07-18 ENCOUNTER — Other Ambulatory Visit: Payer: Self-pay

## 2020-07-18 ENCOUNTER — Encounter (HOSPITAL_COMMUNITY)
Admission: RE | Admit: 2020-07-18 | Discharge: 2020-07-18 | Disposition: A | Discharge status: Home / Self Care | Payer: Medicare Other | Source: Ambulatory Visit | Attending: Internal Medicine | Admitting: Internal Medicine

## 2020-07-18 DIAGNOSIS — Z955 Presence of coronary angioplasty implant and graft: Secondary | ICD-10-CM | POA: Diagnosis not present

## 2020-07-18 DIAGNOSIS — Z95820 Peripheral vascular angioplasty status with implants and grafts: Secondary | ICD-10-CM

## 2020-07-18 DIAGNOSIS — Z79899 Other long term (current) drug therapy: Secondary | ICD-10-CM | POA: Diagnosis not present

## 2020-07-18 NOTE — Progress Notes (Signed)
Daily Session Note  Patient Details  Name: Jeremy Lopez MRN: [id number] Date of Birth: 29-Aug-1951 Referring Provider:     CARDIAC REHAB PHASE II ORIENTATION from 07/14/2020 in [place]  Referring Provider Dr. Harrington Challenger      Encounter Date: 07/18/2020  Check In:  Session Check In - 07/18/20 1100      Check-In   Supervising physician immediately available to respond to emergencies See telemetry face sheet for immediately available MD    Location [facility]    Staff Present Geanie Cooley, Kermit Balo, RN, Bjorn Loser, MS, ACSM-CEP, Exercise Physiologist    Virtual Visit No    Medication changes reported     No    Fall or balance concerns reported    No    Tobacco Cessation No Change    Warm-up and Cool-down Performed as group-led instruction    Resistance Training Performed Yes    VAD Patient? No    PAD/SET Patient? No      Pain Assessment   Currently in Pain? No/denies    Multiple Pain Sites No           Capillary Blood Glucose: No results found for this or any previous visit (from the past 24 hour(s)).    Social History   Tobacco Use  Smoking Status Former Smoker  . Types: Cigarettes  . Start date: 10/25/1966  . Quit date: 10/25/1998  . Years since quitting: 21.7  Smokeless Tobacco Never Used    Goals Met:  Independence with exercise equipment Exercise tolerated well No report of cardiac concerns or symptoms Strength training completed today  Goals Unmet:  Not Applicable  Comments: check out @ 12:00   Dr. Kathie Dike is Medical Director for [facility] Pulmonary Re[facility].

## 2020-07-20 ENCOUNTER — Encounter (HOSPITAL_COMMUNITY)
Admission: RE | Admit: 2020-07-20 | Discharge: 2020-07-20 | Disposition: A | Discharge status: Home / Self Care | Payer: Medicare Other | Source: Ambulatory Visit | Attending: Internal Medicine | Admitting: Internal Medicine

## 2020-07-20 ENCOUNTER — Other Ambulatory Visit: Payer: Self-pay

## 2020-07-20 DIAGNOSIS — Z79899 Other long term (current) drug therapy: Secondary | ICD-10-CM | POA: Diagnosis not present

## 2020-07-20 DIAGNOSIS — Z955 Presence of coronary angioplasty implant and graft: Secondary | ICD-10-CM | POA: Diagnosis not present

## 2020-07-20 DIAGNOSIS — Z95820 Peripheral vascular angioplasty status with implants and grafts: Secondary | ICD-10-CM

## 2020-07-20 NOTE — Progress Notes (Signed)
Daily Session Note  Patient Details  Name: Jeremy Lopez MRN: [id number] Date of Birth: 12/19/1950 Referring Provider:     CARDIAC REHAB PHASE II ORIENTATION from 07/14/2020 in [place]  Referring Provider Dr. Harrington Challenger      Encounter Date: 07/20/2020  Check In:  Session Check In - 07/20/20 0948      Check-In   Supervising physician immediately available to respond to emergencies [facility] MD immediately available    Physician(s) Dr. Domenic Polite    Location [facility]    Staff Present Hoy Register, MS, ACSM-CEP, Exercise Physiologist;Debra Wynetta Emery, RN, BSN    Virtual Visit No    Medication changes reported     No    Fall or balance concerns reported    No    Tobacco Cessation No Change    Warm-up and Cool-down Performed as group-led instruction    Resistance Training Performed Yes    VAD Patient? No    PAD/SET Patient? No      Pain Assessment   Currently in Pain? No/denies    Multiple Pain Sites No           Capillary Blood Glucose: No results found for this or any previous visit (from the past 24 hour(s)).    Social History   Tobacco Use  Smoking Status Former Smoker  . Types: Cigarettes  . Start date: 10/25/1966  . Quit date: 10/25/1998  . Years since quitting: 21.7  Smokeless Tobacco Never Used    Goals Met:  Independence with exercise equipment Exercise tolerated well No report of cardiac concerns or symptoms Strength training completed today  Goals Unmet:  Not Applicable  Comments: checkout time is 1030   Dr. Kathie Dike is Medical Director for [facility] Pulmonary Rehab.

## 2020-07-22 ENCOUNTER — Encounter (HOSPITAL_COMMUNITY)
Admission: RE | Admit: 2020-07-22 | Discharge: 2020-07-22 | Disposition: A | Discharge status: Home / Self Care | Payer: Medicare Other | Source: Ambulatory Visit | Attending: Internal Medicine | Admitting: Internal Medicine

## 2020-07-22 ENCOUNTER — Other Ambulatory Visit: Payer: Self-pay

## 2020-07-22 DIAGNOSIS — Z955 Presence of coronary angioplasty implant and graft: Secondary | ICD-10-CM | POA: Diagnosis not present

## 2020-07-22 DIAGNOSIS — Z79899 Other long term (current) drug therapy: Secondary | ICD-10-CM | POA: Diagnosis not present

## 2020-07-22 DIAGNOSIS — Z95820 Peripheral vascular angioplasty status with implants and grafts: Secondary | ICD-10-CM

## 2020-07-22 NOTE — Progress Notes (Signed)
Daily Session Note  Patient Details  Name: Jeremy Lopez MRN: [id number] Date of Birth: March 18, 1951 Referring Provider:     CARDIAC REHAB PHASE II ORIENTATION from 07/14/2020 in [place]  Referring Provider Dr. Harrington Challenger      Encounter Date: 07/22/2020  Check In:  Session Check In - 07/22/20 1102      Check-In   Supervising physician immediately available to respond to emergencies [facility] MD immediately available    Physician(s) Dr. Harrington Challenger    Location [facility]    Staff Present Ramon Dredge, RN, MHA;Dalton Kris Mouton, MS, ACSM-CEP, Exercise Physiologist    Virtual Visit No    Medication changes reported     No    Fall or balance concerns reported    No    Tobacco Cessation No Change    Warm-up and Cool-down Performed as group-led instruction    Resistance Training Performed Yes    VAD Patient? No    PAD/SET Patient? No      Pain Assessment   Currently in Pain? No/denies    Multiple Pain Sites No           Capillary Blood Glucose: No results found for this or any previous visit (from the past 24 hour(s)).    Social History   Tobacco Use  Smoking Status Former Smoker  . Types: Cigarettes  . Start date: 10/25/1966  . Quit date: 10/25/1998  . Years since quitting: 21.7  Smokeless Tobacco Never Used    Goals Met:  Independence with exercise equipment Exercise tolerated well No report of cardiac concerns or symptoms Strength training completed today  Goals Unmet:  Not Applicable  Comments: [phone]   Dr. Kathie Dike is Medical Director for [facility] Pul[facility] Re[facility].

## 2020-07-25 ENCOUNTER — Other Ambulatory Visit: Payer: Self-pay

## 2020-07-25 ENCOUNTER — Encounter (HOSPITAL_COMMUNITY)
Admission: RE | Admit: 2020-07-25 | Discharge: 2020-07-25 | Disposition: A | Discharge status: Home / Self Care | Payer: Medicare Other | Source: Ambulatory Visit | Attending: Internal Medicine | Admitting: Internal Medicine

## 2020-07-25 VITALS — Wt 220.7 lb

## 2020-07-25 DIAGNOSIS — Z79899 Other long term (current) drug therapy: Secondary | ICD-10-CM | POA: Diagnosis not present

## 2020-07-25 DIAGNOSIS — Z955 Presence of coronary angioplasty implant and graft: Secondary | ICD-10-CM | POA: Diagnosis not present

## 2020-07-25 DIAGNOSIS — Z95820 Peripheral vascular angioplasty status with implants and grafts: Secondary | ICD-10-CM

## 2020-07-25 NOTE — Progress Notes (Signed)
Daily Session Note  Patient Details  Name: Jeremy Lopez MRN: [id number] Date of Birth: October 03, 1951 Referring Provider:     CARDIAC REHAB PHASE II ORIENTATION from 07/14/2020 in [place]  Referring Provider Dr. Harrington Challenger      Encounter Date: 07/25/2020  Check In:  Session Check In - 07/25/20 1101      Check-In   Supervising physician immediately available to respond to emergencies [facility] MD immediately available    Physician(s) Dr. Harl Bowie    Location [facility]    Staff Present Ramon Dredge, RN, MHA;Dalton Kris Mouton, MS, ACSM-CEP, Exercise Physiologist    Virtual Visit No    Medication changes reported     No    Fall or balance concerns reported    No    Tobacco Cessation No Change    Warm-up and Cool-down Performed as group-led instruction    Resistance Training Performed Yes    VAD Patient? No    PAD/SET Patient? No      Pain Assessment   Currently in Pain? No/denies    Multiple Pain Sites No           Capillary Blood Glucose: No results found for this or any previous visit (from the past 24 hour(s)).    Social History   Tobacco Use  Smoking Status Former Smoker   Types: Cigarettes   Start date: 10/25/1966   Quit date: 10/25/1998   Years since quitting: 21.7  Smokeless Tobacco Never Used    Goals Met:  Independence with exercise equipment Exercise tolerated well No report of cardiac concerns or symptoms Strength training completed today  Goals Unmet:  Not Applicable  Comments: [phone]   Dr. Kathie Dike is Medical Director for [facility] Pulmonary Rehab.

## 2020-07-27 ENCOUNTER — Encounter (HOSPITAL_COMMUNITY): Payer: Medicare Other

## 2020-07-29 ENCOUNTER — Encounter (HOSPITAL_COMMUNITY)
Admission: RE | Admit: 2020-07-29 | Discharge: 2020-07-29 | Disposition: A | Discharge status: Home / Self Care | Payer: Medicare Other | Source: Ambulatory Visit | Attending: Internal Medicine | Admitting: Internal Medicine

## 2020-07-29 ENCOUNTER — Other Ambulatory Visit: Payer: Self-pay

## 2020-07-29 DIAGNOSIS — Z955 Presence of coronary angioplasty implant and graft: Secondary | ICD-10-CM | POA: Diagnosis not present

## 2020-07-29 DIAGNOSIS — Z95820 Peripheral vascular angioplasty status with implants and grafts: Secondary | ICD-10-CM

## 2020-07-29 DIAGNOSIS — Z79899 Other long term (current) drug therapy: Secondary | ICD-10-CM | POA: Diagnosis not present

## 2020-07-29 NOTE — Progress Notes (Signed)
Daily Session Note  Patient Details  Name: Jeremy Lopez MRN: [id number] Date of Birth: 05/22/1951 Referring Provider:     CARDIAC REHAB PHASE II ORIENTATION from 07/14/2020 in [place]  Referring Provider Dr. Harrington Challenger      Encounter Date: 07/29/2020  Check In:  Session Check In - 07/29/20 1104      Check-In   Supervising physician immediately available to respond to emergencies [facility] MD immediately available    Physician(s) Dr. Harl Bowie    Location [facility]    Staff Present Ramon Dredge, RN, MHA;Dalton Kris Mouton, MS, ACSM-CEP, Exercise Physiologist    Virtual Visit No    Medication changes reported     No    Fall or balance concerns reported    No    Tobacco Cessation No Change    Warm-up and Cool-down Performed as group-led instruction    Resistance Training Performed Yes    VAD Patient? No    PAD/SET Patient? No      Pain Assessment   Currently in Pain? No/denies    Multiple Pain Sites No           Capillary Blood Glucose: No results found for this or any previous visit (from the past 24 hour(s)).    Social History   Tobacco Use  Smoking Status Former Smoker  . Types: Cigarettes  . Start date: 10/25/1966  . Quit date: 10/25/1998  . Years since quitting: 21.7  Smokeless Tobacco Never Used    Goals Met:  Independence with exercise equipment Exercise tolerated well No report of cardiac concerns or symptoms Strength training completed today  Goals Unmet:  Not Applicable  Comments: [id number]   Dr. Kathie Dike is Medical Director for [facility].

## 2020-08-01 ENCOUNTER — Encounter (HOSPITAL_COMMUNITY)
Admission: RE | Admit: 2020-08-01 | Discharge: 2020-08-01 | Disposition: A | Discharge status: Home / Self Care | Payer: Medicare Other | Source: Ambulatory Visit | Attending: Internal Medicine | Admitting: Internal Medicine

## 2020-08-01 ENCOUNTER — Other Ambulatory Visit: Payer: Self-pay

## 2020-08-01 VITALS — Wt 221.6 lb

## 2020-08-01 DIAGNOSIS — Z955 Presence of coronary angioplasty implant and graft: Secondary | ICD-10-CM | POA: Diagnosis not present

## 2020-08-01 DIAGNOSIS — Z79899 Other long term (current) drug therapy: Secondary | ICD-10-CM | POA: Diagnosis not present

## 2020-08-01 DIAGNOSIS — Z95820 Peripheral vascular angioplasty status with implants and grafts: Secondary | ICD-10-CM

## 2020-08-01 NOTE — Progress Notes (Signed)
Daily Session Note  Patient Details  Name: Jeremy Lopez MRN: [id number] Date of Birth: [birth year] Referring Provider:     CARDIAC REHAB PHASE II ORIENTATION from 07/14/2020 in [place]  Referring Provider Dr. Harrington Challenger      Encounter Date: 08/01/2020  Check In:  Session Check In - 08/01/20 1111      Check-In   Supervising physician immediately available to respond to emergencies [facility] MD immediately available    Physician(s) Dr. Harl Bowie    Location [facility]    Staff Present Aundra Dubin, RN, Bjorn Loser, MS, ACSM-CEP, Exercise Physiologist    Virtual Visit No    Medication changes reported     No    Fall or balance concerns reported    No    Tobacco Cessation No Change    Warm-up and Cool-down Performed as group-led instruction    Resistance Training Performed Yes    VAD Patient? No    PAD/SET Patient? No      Pain Assessment   Currently in Pain? No/denies    Multiple Pain Sites No           Capillary Blood Glucose: No results found for this or any previous visit (from the past 24 hour(s)).    Social History   Tobacco Use  Smoking Status Former Smoker  . Types: Cigarettes  . Start date: 10/25/1966  . Quit date: 10/25/1998  . Years since quitting: 21.7  Smokeless Tobacco Never Used    Goals Met:  Independence with exercise equipment Exercise tolerated well No report of cardiac concerns or symptoms Strength training completed today  Goals Unmet:  Not Applicable  Comments: checkout time is 1200   Dr. Kathie Dike is Medical Director for [facility].

## 2020-08-03 ENCOUNTER — Encounter (HOSPITAL_COMMUNITY)
Admission: RE | Admit: 2020-08-03 | Discharge: 2020-08-03 | Disposition: A | Discharge status: Home / Self Care | Payer: Medicare Other | Source: Ambulatory Visit | Attending: Internal Medicine | Admitting: Internal Medicine

## 2020-08-03 ENCOUNTER — Other Ambulatory Visit: Payer: Self-pay

## 2020-08-03 ENCOUNTER — Encounter (HOSPITAL_COMMUNITY): Payer: Medicare Other

## 2020-08-03 DIAGNOSIS — Z79899 Other long term (current) drug therapy: Secondary | ICD-10-CM | POA: Diagnosis not present

## 2020-08-03 DIAGNOSIS — Z95820 Peripheral vascular angioplasty status with implants and grafts: Secondary | ICD-10-CM

## 2020-08-03 DIAGNOSIS — Z955 Presence of coronary angioplasty implant and graft: Secondary | ICD-10-CM | POA: Diagnosis not present

## 2020-08-03 NOTE — Progress Notes (Signed)
Cardiac Individual Treatment Plan  Patient Details  Name: Jeremy Lopez MRN: [id number] Date of Birth: [birth year] Referring Provider:     CARDIAC REHAB PHASE II ORIENTATION from 07/14/2020 in [place]  Referring Provider Dr. Harrington Challenger      Initial Encounter Date:    CARDIAC REHAB PHASE II ORIENTATION from 07/14/2020 in [place]  Date 07/14/20      Visit Diagnosis: Status post angioplasty with stent  Patient's Home Medications on Admission:  Current Outpatient Medications:  .  albuterol (VENTOLIN HFA) 108 (90 Base) MCG/ACT inhaler, Inhale 2 puffs into the lungs every 6 (six) hours as needed for wheezing or shortness of breath., Disp: , Rfl:  .  amLODipine (NORVASC) 2.5 MG tablet, Take 1 tablet (2.5 mg total) by mouth daily., Disp: 30 tablet, Rfl: 6 .  aspirin 81 MG tablet, Take 81 mg by mouth daily. , Disp: , Rfl:  .  CARTIA XT 240 MG 24 hr capsule, TAKE ONE CAPSULE (240 MG TOTAL) BY MOUTHDAILY., Disp: 90 capsule, Rfl: 3 .  Cholecalciferol (VITAMIN D) 50 MCG (2000 UT) tablet, Take 2,000 Units by mouth daily., Disp: , Rfl:  .  clopidogrel (PLAVIX) 75 MG tablet, Take 1 tablet (75 mg total) by mouth daily with breakfast., Disp: 30 tablet, Rfl: 11 .  fluticasone (FLONASE) 50 MCG/ACT nasal spray, Place 2 sprays into both nostrils daily as needed for allergies or rhinitis., Disp: , Rfl:  .  metoprolol succinate (TOPROL-XL) 50 MG 24 hr tablet, TAKE ONE TABLET (50 MG TOTAL) BY MOUTH DAILY., Disp: 90 tablet, Rfl: 3 .  nitroGLYCERIN (NITROSTAT) 0.4 MG SL tablet, Place 1 tablet (0.4 mg total) under the tongue every 5 (five) minutes as needed for chest pain., Disp: 25 tablet, Rfl: 2 .  rosuvastatin (CRESTOR) 40 MG tablet, TAKE ONE (1) TABLET BY MOUTH EVERY DAY, Disp: 90 tablet, Rfl: 3 No current facility-administered medications for this encounter.  Facility-Administered Medications Ordered in Other Encounters:  .  regadenoson (LEXISCAN) injection SOLN  0.4 mg, 0.4 mg, Intravenous, Once, O'Neal, Cassie Freer, MD  Past Medical History: Past Medical History:  Diagnosis Date  . CAD (coronary artery disease), native coronary artery    DES proximal LAD 07/2009; scattered 20-40% lesions; normal LVEF  . Essential hypertension   . Hyperlipidemia   . Small cell lung cancer ([place])    Right upper lobe 2008; no evidence of disease since treatment ended in 11/2007    Tobacco Use: Social History   Tobacco Use  Smoking Status Former Smoker  . Types: Cigarettes  . Start date: 10/25/1966  . Quit date: 10/25/1998  . Years since quitting: 21.7  Smokeless Tobacco Never Used    Labs: Recent Chemical engineer    Labs for ITP Cardiac and Pulmonary Rehab Latest Ref Rng & Units 01/04/2016 12/27/2017 12/28/2019 04/26/2020 04/26/2020   Cholestrol <200 mg/dL 120 94 128 - -   LDLCALC mg/dL (calc) 51 42 61 - -   HDL > OR = 40 mg/dL 47 39(L) 42 - -   Trlycerides <150 mg/dL 109 64 175(H) - -   Hemoglobin A1c <5.7 % - - - - -   PHART 7.35 - 7.45 - - - - 7.386   PCO2ART 32 - 48 mmHg - - - - 45.1   HCO3 20.0 - 28.0 mmol/L - - - 25.1 27.0   TCO2 22 - 32 mmol/L - - - 26 28   ACIDBASEDEF 0.0 - 2.0 mmol/L - - - - -  O2SAT % - - - 94.0 72.0      Capillary Blood Glucose: No results found for: GLUCAP   Exercise Target Goals: Exercise Program Goal: Individual exercise prescription set using results from initial 6 min walk test and THRR while considering  patient's activity barriers and safety.   Exercise Prescription Goal: Starting with aerobic activity 30 plus minutes a day, 3 days per week for initial exercise prescription. Provide home exercise prescription and guidelines that participant acknowledges understanding prior to discharge.  Activity Barriers & Risk Stratification:   6 Minute Walk:  6 Minute Walk    Row Name 07/14/20 1501         6 Minute Walk   Phase Initial     Distance 1200 feet     Walk Time 6 minutes     # of Rest Breaks 0      MPH 2.27     METS 2.51     RPE 11     VO2 Peak 8.78     Symptoms No     Resting HR 69 bpm     Resting BP 118/60     Resting Oxygen Saturation  96 %     Exercise Oxygen Saturation  during 6 min walk 96 %     Max Ex. HR 86 bpm     Max Ex. BP 146/76     2 Minute Post BP 130/76            Oxygen Initial Assessment:   Oxygen Re-Evaluation:   Oxygen Discharge (Final Oxygen Re-Evaluation):   Initial Exercise Prescription:  Initial Exercise Prescription - 07/14/20 1500      Date of Initial Exercise RX and Referring Provider   Date 07/14/20    Referring Provider Dr. Harrington Challenger    Expected Discharge Date 10/07/20      Treadmill   MPH 1.5    Grade 0    Minutes 17    METs 2.15      NuStep   Level 2    SPM 80    Minutes 22      Prescription Details   Frequency (times per week) 2    Duration Progress to 30 minutes of continuous aerobic without signs/symptoms of physical distress      Intensity   THRR 40-80% of Max Heartrate 60-121    Ratings of Perceived Exertion 11-13    Perceived Dyspnea 0-4      Resistance Training   Training Prescription Yes    Weight 3 lbs    Reps 10-15           Perform Capillary Blood Glucose checks as needed.  Exercise Prescription Changes:   Exercise Prescription Changes    Row Name 07/25/20 1400 08/01/20 1500           Response to Exercise   Blood Pressure (Admit) 140/76 116/80      Blood Pressure (Exercise) 138/74 126/60      Blood Pressure (Exit) 116/70 126/80      Heart Rate (Admit) 84 bpm 78 bpm      Heart Rate (Exercise) 115 bpm 98 bpm      Heart Rate (Exit) 75 bpm 85 bpm      Rating of Perceived Exertion (Exercise) 12 11      Duration Continue with 30 min of aerobic exercise without signs/symptoms of physical distress. Continue with 30 min of aerobic exercise without signs/symptoms of physical distress.      Intensity THRR unchanged THRR unchanged  Progression   Progression Continue to progress workloads to  maintain intensity without signs/symptoms of physical distress. Continue to progress workloads to maintain intensity without signs/symptoms of physical distress.        Resistance Training   Training Prescription Yes Yes      Weight 3 lbs 5 lbs      Reps 10-15 10-15      Time 10 Minutes 10 Minutes        Treadmill   MPH 1.7 1.9      Grade 0 0      Minutes 17 17      METs 2.3 2.45        NuStep   Level 2 2      SPM 98 99      Minutes 22 22      METs 1.9 1.9             Exercise Comments:   Exercise Comments    Row Name 07/18/20 1223           Exercise Comments Pt completed his first day of exercise in cardiac rehab today. He was able to tolerate exercise well without any complaints.              Exercise Goals and Review:   Exercise Goals    Row Name 07/14/20 1505 08/01/20 1515           Exercise Goals   Increase Physical Activity Yes Yes      Intervention Provide advice, education, support and counseling about physical activity/exercise needs.;Develop an individualized exercise prescription for aerobic and resistive training based on initial evaluation findings, risk stratification, comorbidities and participant's personal goals. Provide advice, education, support and counseling about physical activity/exercise needs.;Develop an individualized exercise prescription for aerobic and resistive training based on initial evaluation findings, risk stratification, comorbidities and participant's personal goals.      Expected Outcomes Short Term: Attend rehab on a regular basis to increase amount of physical activity.;Long Term: Exercising regularly at least 3-5 days a week.;Long Term: Add in home exercise to make exercise part of routine and to increase amount of physical activity. Short Term: Attend rehab on a regular basis to increase amount of physical activity.;Long Term: Exercising regularly at least 3-5 days a week.;Long Term: Add in home exercise to make exercise part  of routine and to increase amount of physical activity.      Increase Strength and Stamina Yes Yes      Intervention Provide advice, education, support and counseling about physical activity/exercise needs.;Develop an individualized exercise prescription for aerobic and resistive training based on initial evaluation findings, risk stratification, comorbidities and participant's personal goals. Provide advice, education, support and counseling about physical activity/exercise needs.;Develop an individualized exercise prescription for aerobic and resistive training based on initial evaluation findings, risk stratification, comorbidities and participant's personal goals.      Expected Outcomes Short Term: Increase workloads from initial exercise prescription for resistance, speed, and METs.;Short Term: Perform resistance training exercises routinely during rehab and add in resistance training at home;Long Term: Improve cardiorespiratory fitness, muscular endurance and strength as measured by increased METs and functional capacity (6MWT) Short Term: Increase workloads from initial exercise prescription for resistance, speed, and METs.;Short Term: Perform resistance training exercises routinely during rehab and add in resistance training at home;Long Term: Improve cardiorespiratory fitness, muscular endurance and strength as measured by increased METs and functional capacity (6MWT)      Able to understand and use rate of perceived  exertion (RPE) scale Yes Yes      Intervention Provide education and explanation on how to use RPE scale Provide education and explanation on how to use RPE scale      Expected Outcomes Short Term: Able to use RPE daily in rehab to express subjective intensity level;Long Term:  Able to use RPE to guide intensity level when exercising independently Short Term: Able to use RPE daily in rehab to express subjective intensity level;Long Term:  Able to use RPE to guide intensity level when  exercising independently      Knowledge and understanding of Target Heart Rate Range (THRR) Yes Yes      Intervention Provide education and explanation of THRR including how the numbers were predicted and where they are located for reference Provide education and explanation of THRR including how the numbers were predicted and where they are located for reference      Expected Outcomes Short Term: Able to state/look up THRR;Long Term: Able to use THRR to govern intensity when exercising independently;Short Term: Able to use daily as guideline for intensity in rehab Short Term: Able to state/look up THRR;Long Term: Able to use THRR to govern intensity when exercising independently;Short Term: Able to use daily as guideline for intensity in rehab      Able to check pulse independently Yes Yes      Intervention Provide education and demonstration on how to check pulse in carotid and radial arteries.;Review the importance of being able to check your own pulse for safety during independent exercise Provide education and demonstration on how to check pulse in carotid and radial arteries.;Review the importance of being able to check your own pulse for safety during independent exercise      Expected Outcomes Short Term: Able to explain why pulse checking is important during independent exercise;Long Term: Able to check pulse independently and accurately Short Term: Able to explain why pulse checking is important during independent exercise;Long Term: Able to check pulse independently and accurately      Understanding of Exercise Prescription Yes Yes      Intervention Provide education, explanation, and written materials on patient's individual exercise prescription Provide education, explanation, and written materials on patient's individual exercise prescription      Expected Outcomes Short Term: Able to explain program exercise prescription;Long Term: Able to explain home exercise prescription to exercise  independently Short Term: Able to explain program exercise prescription;Long Term: Able to explain home exercise prescription to exercise independently             Exercise Goals Re-Evaluation :  Exercise Goals Re-Evaluation    Row Name 08/01/20 1515             Exercise Goal Re-Evaluation   Exercise Goals Review Increase Strength and Stamina;Able to understand and use rate of perceived exertion (RPE) scale;Knowledge and understanding of Target Heart Rate Range (THRR);Able to check pulse independently;Understanding of Exercise Prescription       Comments Pt has attended 6 exercise sessions. He is unmotivated and does not push himself. His walking on the treadmill has improved. He currently exercises at 1.9 METs on the stepper, but could likely do much more if he tried. Will continue to monitor and progress as able.       Expected Outcomes Through exercise at rehab and by engaging in a home exercise plan, the patient will be able to reach their goals.               Discharge  Exercise Prescription (Final Exercise Prescription Changes):  Exercise Prescription Changes - 08/01/20 1500      Response to Exercise   Blood Pressure (Admit) 116/80    Blood Pressure (Exercise) 126/60    Blood Pressure (Exit) 126/80    Heart Rate (Admit) 78 bpm    Heart Rate (Exercise) 98 bpm    Heart Rate (Exit) 85 bpm    Rating of Perceived Exertion (Exercise) 11    Duration Continue with 30 min of aerobic exercise without signs/symptoms of physical distress.    Intensity THRR unchanged      Progression   Progression Continue to progress workloads to maintain intensity without signs/symptoms of physical distress.      Resistance Training   Training Prescription Yes    Weight 5 lbs    Reps 10-15    Time 10 Minutes      Treadmill   MPH 1.9    Grade 0    Minutes 17    METs 2.45      NuStep   Level 2    SPM 99    Minutes 22    METs 1.9           Nutrition:  Target Goals: Understanding  of nutrition guidelines, daily intake of sodium 1500mg , cholesterol 200mg , calories 30% from fat and 7% or less from saturated fats, daily to have 5 or more servings of fruits and vegetables.  Biometrics:  Pre Biometrics - 07/14/20 1506      Pre Biometrics   Height 5\' 10"  (1.778 m)    Weight 101.1 kg    Waist Circumference 45.5 inches    Hip Circumference 45.5 inches    Waist to Hip Ratio 1 %    BMI (Calculated) 31.98    Triceps Skinfold 27 mm    % Body Fat 34 %    Grip Strength 38 kg    Flexibility 16.5 in    Single Leg Stand 6.61 seconds            Nutrition Therapy Plan and Nutrition Goals:  Nutrition Therapy & Goals - 08/01/20 1256      Personal Nutrition Goals   Comments We continue to provide education through hand-outs regarding nutrition and making healthier choices.      Intervention Plan   Intervention Nutrition handout(s) given to patient.           Nutrition Assessments:  Nutrition Assessments - 07/14/20 1501      MEDFICTS Scores   Pre Score 19           Nutrition Goals Re-Evaluation:   Nutrition Goals Discharge (Final Nutrition Goals Re-Evaluation):   Psychosocial: Target Goals: Acknowledge presence or absence of significant depression and/or stress, maximize coping skills, provide positive support system. Participant is able to verbalize types and ability to use techniques and skills needed for reducing stress and depression.  Initial Review & Psychosocial Screening:  Initial Psych Review & Screening - 07/14/20 1503      Initial Review   Current issues with None Identified      Family Dynamics   Good Support System? Yes    Comments Patient lives with his wife of many years. He denies any depression or anxiety. He has children and is getting ready to enjoy a new grandchild. He has a positive outlook regarding his future. Will continue to monitor.      Barriers   Psychosocial barriers to participate in program There are no identifiable  barriers or psychosocial needs.;The  patient should benefit from training in stress management and relaxation.      Screening Interventions   Interventions Encouraged to exercise           Quality of Life Scores:  Quality of Life - 07/14/20 1501      Quality of Life   Select Quality of Life      Quality of Life Scores   Health/Function Pre 24.4 %    Socioeconomic Pre 28.5 %    Psych/Spiritual Pre 24 %    Family Pre 28.8 %    GLOBAL Pre 25.89 %          Scores of 19 and below usually indicate a poorer quality of life in these areas.  A difference of  2-3 points is a clinically meaningful difference.  A difference of 2-3 points in the total score of the Quality of Life Index has been associated with significant improvement in overall quality of life, self-image, physical symptoms, and general health in studies assessing change in quality of life.  PHQ-9: Recent Review Flowsheet Data    Depression screen [facility] 2/9 07/14/2020   Decreased Interest 0   Down, Depressed, Hopeless 0   PHQ - 2 Score 0   Altered sleeping 0   Tired, decreased energy 1   Change in appetite 0   Feeling bad or failure about yourself  0   Trouble concentrating 0   Moving slowly or fidgety/restless 0   Suicidal thoughts 0   PHQ-9 Score 1   Difficult doing work/chores Not difficult at all     Interpretation of Total Score  Total Score Depression Severity:  1-4 = Minimal depression, 5-9 = Mild depression, 10-14 = Moderate depression, 15-19 = Moderately severe depression, 20-27 = Severe depression   Psychosocial Evaluation and Intervention:  Psychosocial Evaluation - 07/14/20 1504      Psychosocial Evaluation & Interventions   Interventions Encouraged to exercise with the program and follow exercise prescription;Stress management education;Relaxation education    Comments Patient's initial QOL score was 25.89 overall and his PHQ-9 score was 1 with no psychosocial issues identified at his orientation  visit.    Expected Outcomes Patient will have no psychosocial issues identified at discharge.    Continue Psychosocial Services  No Follow up required           Psychosocial Re-Evaluation:  Psychosocial Re-Evaluation    Candler Name 08/01/20 1257             Psychosocial Re-Evaluation   Current issues with None Identified       Comments Patient continues to have no psychosocial issues identified. Will continue to monitor.       Expected Outcomes Patient will have no psychosocial issues identified at discharge.       Interventions Relaxation education;Encouraged to attend Cardiac Rehabilitation for the exercise;Stress management education       Continue Psychosocial Services  No Follow up required              Psychosocial Discharge (Final Psychosocial Re-Evaluation):  Psychosocial Re-Evaluation - 08/01/20 1257      Psychosocial Re-Evaluation   Current issues with None Identified    Comments Patient continues to have no psychosocial issues identified. Will continue to monitor.    Expected Outcomes Patient will have no psychosocial issues identified at discharge.    Interventions Relaxation education;Encouraged to attend Cardiac Rehabilitation for the exercise;Stress management education    Continue Psychosocial Services  No Follow up required  Vocational Rehabilitation: Provide vocational rehab assistance to qualifying candidates.   Vocational Rehab Evaluation & Intervention:  Vocational Rehab - 07/14/20 1501      Initial Vocational Rehab Evaluation & Intervention   Assessment shows need for Vocational Rehabilitation No      Vocational Rehab Re-Evaulation   Comments Patient is a retired Emergency planning/management officer. He is not interested in returning to work and does not need vocational rehab.           Education: Education Goals: Education classes will be provided on a weekly basis, covering required topics. Participant will state understanding/return demonstration of  topics presented.  Learning Barriers/Preferences:  Learning Barriers/Preferences - 07/14/20 1501      Learning Barriers/Preferences   Learning Barriers None    Learning Preferences Written Material;Video;Audio;Skilled Demonstration           Education Topics: Hypertension, Hypertension Reduction -Define heart disease and high blood pressure. Discus how high blood pressure affects the body and ways to reduce high blood pressure.   Exercise and Your Heart -Discuss why it is important to exercise, the FITT principles of exercise, normal and abnormal responses to exercise, and how to exercise safely.   CARDIAC REHAB PHASE II EXERCISE from 07/29/2020 in [place]  Date 07/20/20  Educator DF  Instruction Review Code 2- Demonstrated Understanding      Angina -Discuss definition of angina, causes of angina, treatment of angina, and how to decrease risk of having angina.   CARDIAC REHAB PHASE II EXERCISE from 07/29/2020 in [place]  Date 07/29/20  Educator DF  Instruction Review Code 2- Demonstrated Understanding      Cardiac Medications -Review what the following cardiac medications are used for, how they affect the body, and side effects that may occur when taking the medications.  Medications include Aspirin, Beta blockers, calcium channel blockers, ACE Inhibitors, angiotensin receptor blockers, diuretics, digoxin, and antihyperlipidemics.   Congestive Heart Failure -Discuss the definition of CHF, how to live with CHF, the signs and symptoms of CHF, and how keep track of weight and sodium intake.   Heart Disease and Intimacy -Discus the effect sexual activity has on the heart, how changes occur during intimacy as we age, and safety during sexual activity.   Smoking Cessation / COPD -Discuss different methods to quit smoking, the health benefits of quitting smoking, and the definition of COPD.   Nutrition I: Fats -Discuss  the types of cholesterol, what cholesterol does to the heart, and how cholesterol levels can be controlled.   Nutrition II: Labels -Discuss the different components of food labels and how to read food label   Heart Parts/Heart Disease and PAD -Discuss the anatomy of the heart, the pathway of blood circulation through the heart, and these are affected by heart disease.   Stress I: Signs and Symptoms -Discuss the causes of stress, how stress may lead to anxiety and depression, and ways to limit stress.   Stress II: Relaxation -Discuss different types of relaxation techniques to limit stress.   Warning Signs of Stroke / TIA -Discuss definition of a stroke, what the signs and symptoms are of a stroke, and how to identify when someone is having stroke.   Knowledge Questionnaire Score:  Knowledge Questionnaire Score - 07/14/20 1501      Knowledge Questionnaire Score   Pre Score 23/24           Core Components/Risk Factors/Patient Goals at Admission:  Personal Goals and Risk Factors at Admission -  07/14/20 1502      Core Components/Risk Factors/Patient Goals on Admission    Weight Management Obesity    Personal Goal Other Yes    Personal Goal Increase strength and stamina. Increase strength in his legs especially. Maintain his health.    Intervention Patient will attend CR 2 days/week and supplement with exercise at home 3 days/week.    Expected Outcomes Patient will meet both program and personal goals.           Core Components/Risk Factors/Patient Goals Review:   Goals and Risk Factor Review    Row Name 08/01/20 1258             Core Components/Risk Factors/Patient Goals Review   Personal Goals Review Weight Management/Obesity       Review Patient has completed 7 sessions losing 3 lbs since his initial visit. He has multiple risk factors for CAD and is participating in CR to modify risk factors. His personal goals are to increase strength and stamina; increase  strength in his legs; and maintain his health. His attendance is consistent and he is doing well in the program with progression. Will continue to monitor.       Expected Outcomes Patient will complete the program meeting both his personal and program goals.              Core Components/Risk Factors/Patient Goals at Discharge (Final Review):   Goals and Risk Factor Review - 08/01/20 1258      Core Components/Risk Factors/Patient Goals Review   Personal Goals Review Weight Management/Obesity    Review Patient has completed 7 sessions losing 3 lbs since his initial visit. He has multiple risk factors for CAD and is participating in CR to modify risk factors. His personal goals are to increase strength and stamina; increase strength in his legs; and maintain his health. His attendance is consistent and he is doing well in the program with progression. Will continue to monitor.    Expected Outcomes Patient will complete the program meeting both his personal and program goals.           ITP Comments:   Comments: ITP REVIEW Pt is making expected progress toward Cardiac Rehab goals after completing 7 sessions. Recommend continued exercise, life style modification, education, and increased stamina and strength.

## 2020-08-03 NOTE — Progress Notes (Signed)
Daily Session Note  Patient Details  Name: Jeremy Lopez MRN: [id number] Date of Birth: Oct 11, 1950 Referring Provider:     CARDIAC REHAB PHASE II ORIENTATION from 07/14/2020 in [place]  Referring Provider Dr. Harrington Challenger      Encounter Date: 08/03/2020  Check In:  Session Check In - 08/03/20 1106      Check-In   Supervising physician immediately available to respond to emergencies [facility] MD immediately available    Physician(s) Dr. Harl Bowie    Location [facility]    Staff Present Aundra Dubin, RN, Bjorn Loser, MS, ACSM-CEP, Exercise Physiologist    Virtual Visit No    Medication changes reported     No    Fall or balance concerns reported    No    Tobacco Cessation No Change    Warm-up and Cool-down Performed as group-led instruction    Resistance Training Performed Yes    VAD Patient? No    PAD/SET Patient? No      Pain Assessment   Currently in Pain? No/denies    Multiple Pain Sites No           Capillary Blood Glucose: No results found for this or any previous visit (from the past 24 hour(s)).    Social History   Tobacco Use  Smoking Status Former Smoker  . Types: Cigarettes  . Start date: 10/25/1966  . Quit date: 10/25/1998  . Years since quitting: 21.7  Smokeless Tobacco Never Used    Goals Met:  Independence with exercise equipment Exercise tolerated well No report of cardiac concerns or symptoms Strength training completed today  Goals Unmet:  Not Applicable  Comments: checkout time is 1200   Dr. Kathie Dike is Medical Director for [facility] Pul[facility].

## 2020-08-05 ENCOUNTER — Encounter (HOSPITAL_COMMUNITY)
Admission: RE | Admit: 2020-08-05 | Discharge: 2020-08-05 | Disposition: A | Discharge status: Home / Self Care | Payer: Medicare Other | Source: Ambulatory Visit | Attending: Internal Medicine | Admitting: Internal Medicine

## 2020-08-05 ENCOUNTER — Other Ambulatory Visit: Payer: Self-pay

## 2020-08-05 DIAGNOSIS — Z79899 Other long term (current) drug therapy: Secondary | ICD-10-CM | POA: Diagnosis not present

## 2020-08-05 DIAGNOSIS — Z95820 Peripheral vascular angioplasty status with implants and grafts: Secondary | ICD-10-CM

## 2020-08-05 DIAGNOSIS — Z955 Presence of coronary angioplasty implant and graft: Secondary | ICD-10-CM | POA: Diagnosis not present

## 2020-08-05 NOTE — Progress Notes (Signed)
Daily Session Note  Patient Details  Name: MONTRAE BRAITHWAITE MRN: [id number] Date of Birth: 1951-09-30 Referring Provider:     CARDIAC REHAB PHASE II ORIENTATION from 07/14/2020 in [place]  Referring Provider Dr. Harrington Challenger      Encounter Date: 08/05/2020  Check In:  Session Check In - 08/05/20 1102      Check-In   Supervising physician immediately available to respond to emergencies [facility] immediately available    Physician(s) Dr. Harl Bowie    Location [facility]    Staff Present Aundra Dubin, RN, Bjorn Loser, MS, ACSM-CEP, Exercise Physiologist    Virtual Visit No    Medication changes reported     No    Fall or balance concerns reported    No    Tobacco Cessation No Change    Warm-up and Cool-down Performed as group-led instruction    Resistance Training Performed Yes    VAD Patient? No    PAD/SET Patient? No      Pain Assessment   Currently in Pain? No/denies    Multiple Pain Sites No           Capillary Blood Glucose: No results found for this or any previous visit (from the past 24 hour(s)).    Social History   Tobacco Use  Smoking Status Former Smoker  . Types: Cigarettes  . Start date: 10/25/1966  . Quit date: 10/25/1998  . Years since quitting: 21.7  Smokeless Tobacco Never Used    Goals Met:  Independence with exercise equipment Exercise tolerated well No report of cardiac concerns or symptoms Strength training completed today  Goals Unmet:  Not Applicable  Comments: checkout time is 1200   Dr. Kathie Dike is Medical Director for [facility] Pul[facility] Rehab.

## 2020-08-08 ENCOUNTER — Encounter (HOSPITAL_COMMUNITY)
Admission: RE | Admit: 2020-08-08 | Discharge: 2020-08-08 | Disposition: A | Discharge status: Home / Self Care | Payer: Medicare Other | Source: Ambulatory Visit | Attending: Internal Medicine | Admitting: Internal Medicine

## 2020-08-08 ENCOUNTER — Other Ambulatory Visit: Payer: Self-pay

## 2020-08-08 DIAGNOSIS — Z87891 Personal history of nicotine dependence: Secondary | ICD-10-CM | POA: Diagnosis not present

## 2020-08-08 DIAGNOSIS — I2510 Atherosclerotic heart disease of native coronary artery without angina pectoris: Secondary | ICD-10-CM | POA: Diagnosis not present

## 2020-08-08 DIAGNOSIS — Z95820 Peripheral vascular angioplasty status with implants and grafts: Secondary | ICD-10-CM | POA: Insufficient documentation

## 2020-08-08 DIAGNOSIS — I10 Essential (primary) hypertension: Secondary | ICD-10-CM | POA: Insufficient documentation

## 2020-08-08 DIAGNOSIS — Z79899 Other long term (current) drug therapy: Secondary | ICD-10-CM | POA: Diagnosis not present

## 2020-08-08 NOTE — Progress Notes (Signed)
Daily Session Note  Patient Details  Name: Jeremy Lopez MRN: [id number] Date of Birth: 22-Aug-1951 Referring Provider:     CARDIAC REHAB PHASE II ORIENTATION from 07/14/2020 in [place]  Referring Provider Dr. Harrington Challenger      Encounter Date: 08/08/2020  Check In:  Session Check In - 08/08/20 1112      Check-In   Supervising physician immediately available to respond to emergencies [facility] immediately available    Physician(s) Dr. Harl Bowie    Location [facility]    Staff Present Aundra Dubin, RN, Bjorn Loser, MS, ACSM-CEP, Exercise Physiologist    Virtual Visit No    Medication changes reported     No    Fall or balance concerns reported    No    Tobacco Cessation No Change    Warm-up and Cool-down Performed as group-led instruction    Resistance Training Performed Yes    VAD Patient? No    PAD/SET Patient? No      Pain Assessment   Currently in Pain? No/denies    Multiple Pain Sites No           Capillary Blood Glucose: No results found for this or any previous visit (from the past 24 hour(s)).    Social History   Tobacco Use  Smoking Status Former Smoker  . Types: Cigarettes  . Start date: 10/25/1966  . Quit date: 10/25/1998  . Years since quitting: 21.8  Smokeless Tobacco Never Used    Goals Met:  Independence with exercise equipment Exercise tolerated well No report of cardiac concerns or symptoms Strength training completed today  Goals Unmet:  Not Applicable  Comments: checkout time is 1200   Dr. Kathie Dike is Medical Director for [facility] Pul[facility] Rehab.

## 2020-08-10 ENCOUNTER — Encounter (HOSPITAL_COMMUNITY)
Admission: RE | Admit: 2020-08-10 | Discharge: 2020-08-10 | Disposition: A | Discharge status: Home / Self Care | Payer: Medicare Other | Source: Ambulatory Visit | Attending: Internal Medicine | Admitting: Internal Medicine

## 2020-08-10 ENCOUNTER — Other Ambulatory Visit: Payer: Self-pay

## 2020-08-10 DIAGNOSIS — Z79899 Other long term (current) drug therapy: Secondary | ICD-10-CM | POA: Diagnosis not present

## 2020-08-10 DIAGNOSIS — I10 Essential (primary) hypertension: Secondary | ICD-10-CM | POA: Diagnosis not present

## 2020-08-10 DIAGNOSIS — Z95820 Peripheral vascular angioplasty status with implants and grafts: Secondary | ICD-10-CM

## 2020-08-10 DIAGNOSIS — Z87891 Personal history of nicotine dependence: Secondary | ICD-10-CM | POA: Diagnosis not present

## 2020-08-10 DIAGNOSIS — I2510 Atherosclerotic heart disease of native coronary artery without angina pectoris: Secondary | ICD-10-CM | POA: Diagnosis not present

## 2020-08-10 NOTE — Progress Notes (Signed)
Daily Session Note  Patient Details  Name: Jeremy Lopez MRN: [id number] Date of Birth: 1951-07-05 Referring Provider:     CARDIAC REHAB PHASE II ORIENTATION from 07/14/2020 in [place]  Referring Provider Dr. Harrington Challenger      Encounter Date: 08/10/2020  Check In:  Session Check In - 08/10/20 1113      Check-In   Supervising physician immediately available to respond to emergencies [facility] immediately available    Physician(s) Dr. Domenic Polite    Location [facility]    Staff Present Aundra Dubin, RN, Bjorn Loser, MS, ACSM-CEP, Exercise Physiologist    Virtual Visit No    Medication changes reported     No    Fall or balance concerns reported    No    Tobacco Cessation No Change    Warm-up and Cool-down Performed as group-led instruction    Resistance Training Performed Yes    VAD Patient? No    PAD/SET Patient? No      Pain Assessment   Currently in Pain? No/denies    Multiple Pain Sites No           Capillary Blood Glucose: No results found for this or any previous visit (from the past 24 hour(s)).    Social History   Tobacco Use  Smoking Status Former Smoker  . Types: Cigarettes  . Start date: 10/25/1966  . Quit date: 10/25/1998  . Years since quitting: 21.8  Smokeless Tobacco Never Used    Goals Met:  Independence with exercise equipment Exercise tolerated well No report of cardiac concerns or symptoms Strength training completed today  Goals Unmet:  Not Applicable  Comments: checkout time is 1200   Dr. Kathie Dike is Medical Director for [facility] Re[facility].

## 2020-08-12 ENCOUNTER — Encounter (HOSPITAL_COMMUNITY)
Admission: RE | Admit: 2020-08-12 | Discharge: 2020-08-12 | Disposition: A | Discharge status: Home / Self Care | Payer: Medicare Other | Source: Ambulatory Visit | Attending: Internal Medicine | Admitting: Internal Medicine

## 2020-08-12 ENCOUNTER — Other Ambulatory Visit: Payer: Self-pay

## 2020-08-12 DIAGNOSIS — I2510 Atherosclerotic heart disease of native coronary artery without angina pectoris: Secondary | ICD-10-CM | POA: Diagnosis not present

## 2020-08-12 DIAGNOSIS — I10 Essential (primary) hypertension: Secondary | ICD-10-CM | POA: Diagnosis not present

## 2020-08-12 DIAGNOSIS — Z79899 Other long term (current) drug therapy: Secondary | ICD-10-CM | POA: Diagnosis not present

## 2020-08-12 DIAGNOSIS — Z95820 Peripheral vascular angioplasty status with implants and grafts: Secondary | ICD-10-CM

## 2020-08-12 DIAGNOSIS — Z87891 Personal history of nicotine dependence: Secondary | ICD-10-CM | POA: Diagnosis not present

## 2020-08-12 NOTE — Progress Notes (Signed)
Daily Session Note  Patient Details  Name: FERRELL CLAIBORNE MRN: [id number] Date of Birth: [birth year] Referring Provider:     CARDIAC REHAB PHASE II ORIENTATION from 07/14/2020 in [place]  Referring Provider Dr. Harrington Challenger      Encounter Date: 08/12/2020  Check In:  Session Check In - 08/12/20 1110      Check-In   Supervising physician immediately available to respond to emergencies [facility] immediately available    Physician(s) Dr. Harl Bowie    Location [facility]    Staff Present Aundra Dubin, RN, Bjorn Loser, MS, ACSM-CEP, Exercise Physiologist    Virtual Visit No    Medication changes reported     No    Fall or balance concerns reported    No    Tobacco Cessation No Change    Warm-up and Cool-down Performed as group-led instruction    Resistance Training Performed Yes    VAD Patient? No    PAD/SET Patient? No      Pain Assessment   Currently in Pain? No/denies    Multiple Pain Sites No           Capillary Blood Glucose: No results found for this or any previous visit (from the past 24 hour(s)).    Social History   Tobacco Use  Smoking Status Former Smoker  . Types: Cigarettes  . Start date: 10/25/1966  . Quit date: 10/25/1998  . Years since quitting: 21.8  Smokeless Tobacco Never Used    Goals Met:  Independence with exercise equipment Exercise tolerated well No report of cardiac concerns or symptoms Strength training completed today  Goals Unmet:  Not Applicable  Comments: checkout time is 1200   Dr. Kathie Dike is Medical Director for [facility] Pul[facility] Re[facility].

## 2020-08-15 ENCOUNTER — Other Ambulatory Visit: Payer: Self-pay

## 2020-08-15 ENCOUNTER — Encounter (HOSPITAL_COMMUNITY)
Admission: RE | Admit: 2020-08-15 | Discharge: 2020-08-15 | Disposition: A | Discharge status: Home / Self Care | Payer: Medicare Other | Source: Ambulatory Visit | Attending: Internal Medicine | Admitting: Internal Medicine

## 2020-08-15 VITALS — Wt 221.1 lb

## 2020-08-15 DIAGNOSIS — I10 Essential (primary) hypertension: Secondary | ICD-10-CM | POA: Diagnosis not present

## 2020-08-15 DIAGNOSIS — Z87891 Personal history of nicotine dependence: Secondary | ICD-10-CM | POA: Diagnosis not present

## 2020-08-15 DIAGNOSIS — Z95820 Peripheral vascular angioplasty status with implants and grafts: Secondary | ICD-10-CM

## 2020-08-15 DIAGNOSIS — I2510 Atherosclerotic heart disease of native coronary artery without angina pectoris: Secondary | ICD-10-CM | POA: Diagnosis not present

## 2020-08-15 DIAGNOSIS — Z79899 Other long term (current) drug therapy: Secondary | ICD-10-CM | POA: Diagnosis not present

## 2020-08-15 NOTE — Progress Notes (Signed)
Daily Session Note  Patient Details  Name: Jeremy Lopez MRN: [id number] Date of Birth: Feb 07, 1951 Referring Provider:     CARDIAC REHAB PHASE II ORIENTATION from 07/14/2020 in [place]  Referring Provider Dr. Harrington Challenger      Encounter Date: 08/15/2020  Check In:  Session Check In - 08/15/20 1128      Check-In   Supervising physician immediately available to respond to emergencies [facility] immediately available    Physician(s) Dr. Domenic Polite    Location [facility]    Staff Present Aundra Dubin, RN, Bjorn Loser, MS, ACSM-CEP, Exercise Physiologist    Virtual Visit No    Medication changes reported     No    Fall or balance concerns reported    No    Tobacco Cessation No Change    Warm-up and Cool-down Performed as group-led instruction    Resistance Training Performed Yes    VAD Patient? No    PAD/SET Patient? No      Pain Assessment   Currently in Pain? No/denies    Multiple Pain Sites No           Capillary Blood Glucose: No results found for this or any previous visit (from the past 24 hour(s)).    Social History   Tobacco Use  Smoking Status Former Smoker  . Types: Cigarettes  . Start date: 10/25/1966  . Quit date: 10/25/1998  . Years since quitting: 21.8  Smokeless Tobacco Never Used    Goals Met:  Independence with exercise equipment Exercise tolerated well No report of cardiac concerns or symptoms Strength training completed today  Goals Unmet:  Not Applicable  Comments: checkout time is 1200   Dr. Kathie Dike is Medical Director for [facility] Pul[facility] Rehab.

## 2020-08-17 ENCOUNTER — Other Ambulatory Visit: Payer: Self-pay

## 2020-08-17 ENCOUNTER — Encounter (HOSPITAL_COMMUNITY)
Admission: RE | Admit: 2020-08-17 | Discharge: 2020-08-17 | Disposition: A | Discharge status: Home / Self Care | Payer: Medicare Other | Source: Ambulatory Visit | Attending: Internal Medicine | Admitting: Internal Medicine

## 2020-08-17 DIAGNOSIS — Z95820 Peripheral vascular angioplasty status with implants and grafts: Secondary | ICD-10-CM | POA: Diagnosis not present

## 2020-08-17 DIAGNOSIS — I10 Essential (primary) hypertension: Secondary | ICD-10-CM | POA: Diagnosis not present

## 2020-08-17 DIAGNOSIS — I2510 Atherosclerotic heart disease of native coronary artery without angina pectoris: Secondary | ICD-10-CM | POA: Diagnosis not present

## 2020-08-17 DIAGNOSIS — Z87891 Personal history of nicotine dependence: Secondary | ICD-10-CM | POA: Diagnosis not present

## 2020-08-17 DIAGNOSIS — Z79899 Other long term (current) drug therapy: Secondary | ICD-10-CM | POA: Diagnosis not present

## 2020-08-17 NOTE — Progress Notes (Signed)
Daily Session Note  Patient Details  Name: Jeremy Lopez MRN: [id number] Date of Birth: 09/27/1951 Referring Provider:     CARDIAC REHAB PHASE II ORIENTATION from 07/14/2020 in [place]  Referring Provider Dr. Harrington Challenger      Encounter Date: 08/17/2020  Check In:  Session Check In - 08/17/20 1113      Check-In   Supervising physician immediately available to respond to emergencies [facility] immediately available    Physician(s) Dr. Harl Bowie    Location [facility]    Staff Present Aundra Dubin, RN, BSN;[place] Audria Nine, MS, Exercise Physiologist;Ellyse Rotolo Kris Mouton, MS, ACSM-CEP, Exercise Physiologist    Virtual Visit No    Medication changes reported     No    Fall or balance concerns reported    No    Tobacco Cessation No Change    Warm-up and Cool-down Performed as group-led instruction    Resistance Training Performed Yes    VAD Patient? No    PAD/SET Patient? No      Pain Assessment   Currently in Pain? No/denies    Multiple Pain Sites No           Capillary Blood Glucose: No results found for this or any previous visit (from the past 24 hour(s)).    Social History   Tobacco Use  Smoking Status Former Smoker  . Types: Cigarettes  . Start date: 10/25/1966  . Quit date: 10/25/1998  . Years since quitting: 21.8  Smokeless Tobacco Never Used    Goals Met:  Independence with exercise equipment Exercise tolerated well No report of cardiac concerns or symptoms Strength training completed today  Goals Unmet:  Not Applicable  Comments: check out 1200   Dr. Kathie Dike is Medical Director for [facility] Pulmonary Rehab.

## 2020-08-19 ENCOUNTER — Encounter (HOSPITAL_COMMUNITY): Payer: Medicare Other

## 2020-08-22 ENCOUNTER — Encounter (HOSPITAL_COMMUNITY): Payer: Medicare Other

## 2020-08-24 ENCOUNTER — Encounter (HOSPITAL_COMMUNITY)
Admission: RE | Admit: 2020-08-24 | Discharge: 2020-08-24 | Disposition: A | Discharge status: Home / Self Care | Payer: Medicare Other | Source: Ambulatory Visit | Attending: Internal Medicine | Admitting: Internal Medicine

## 2020-08-24 ENCOUNTER — Other Ambulatory Visit: Payer: Self-pay

## 2020-08-24 DIAGNOSIS — Z87891 Personal history of nicotine dependence: Secondary | ICD-10-CM | POA: Diagnosis not present

## 2020-08-24 DIAGNOSIS — Z95820 Peripheral vascular angioplasty status with implants and grafts: Secondary | ICD-10-CM | POA: Diagnosis not present

## 2020-08-24 DIAGNOSIS — I10 Essential (primary) hypertension: Secondary | ICD-10-CM | POA: Diagnosis not present

## 2020-08-24 DIAGNOSIS — Z79899 Other long term (current) drug therapy: Secondary | ICD-10-CM | POA: Diagnosis not present

## 2020-08-24 DIAGNOSIS — I2510 Atherosclerotic heart disease of native coronary artery without angina pectoris: Secondary | ICD-10-CM | POA: Diagnosis not present

## 2020-08-24 NOTE — Progress Notes (Signed)
Cardiac Individual Treatment Plan  Patient Details  Name: Jeremy Lopez MRN: [id number] Date of Birth: 1951/10/04 Referring Provider:     CARDIAC REHAB PHASE II ORIENTATION from 07/14/2020 in [facility]  Referring Provider Dr. Harrington Challenger      Initial Encounter Date:    CARDIAC REHAB PHASE II ORIENTATION from 07/14/2020 in [facility]  Date 07/14/20      Visit Diagnosis: Status post angioplasty with stent  Patient's Home Medications on Admission:  Current Outpatient Medications:  .  albuterol (VENTOLIN HFA) 108 (90 Base) MCG/ACT inhaler, Inhale 2 puffs into the lungs every 6 (six) hours as needed for wheezing or shortness of breath., Disp: , Rfl:  .  amLODipine (NORVASC) 2.5 MG tablet, Take 1 tablet (2.5 mg total) by mouth daily., Disp: 30 tablet, Rfl: 6 .  aspirin 81 MG tablet, Take 81 mg by mouth daily. , Disp: , Rfl:  .  CARTIA XT 240 MG 24 hr capsule, TAKE ONE CAPSULE (240 MG TOTAL) BY MOUTHDAILY., Disp: 90 capsule, Rfl: 3 .  Cholecalciferol (VITAMIN D) 50 MCG (2000 UT) tablet, Take 2,000 Units by mouth daily., Disp: , Rfl:  .  clopidogrel (PLAVIX) 75 MG tablet, Take 1 tablet (75 mg total) by mouth daily with breakfast., Disp: 30 tablet, Rfl: 11 .  fluticasone (FLONASE) 50 MCG/ACT nasal spray, Place 2 sprays into both nostrils daily as needed for allergies or rhinitis., Disp: , Rfl:  .  metoprolol succinate (TOPROL-XL) 50 MG 24 hr tablet, TAKE ONE TABLET (50 MG TOTAL) BY MOUTH DAILY., Disp: 90 tablet, Rfl: 3 .  nitroGLYCERIN (NITROSTAT) 0.4 MG SL tablet, Place 1 tablet (0.4 mg total) under the tongue every 5 (five) minutes as needed for chest pain., Disp: 25 tablet, Rfl: 2 .  rosuvastatin (CRESTOR) 40 MG tablet, TAKE ONE (1) TABLET BY MOUTH EVERY DAY, Disp: 90 tablet, Rfl: 3 No current facility-administered medications for this encounter.  Facility-Administered Medications Ordered in Other Encounters:  .  regadenoson (LEXISCAN) injection SOLN  0.4 mg, 0.4 mg, Intravenous, Once, O'Neal, Cassie Freer, MD  Past Medical History: Past Medical History:  Diagnosis Date  . CAD (coronary artery disease), native coronary artery    DES proximal LAD 07/2009; scattered 20-40% lesions; normal LVEF  . Essential hypertension   . Hyperlipidemia   . Small cell lung cancer ([place])    Right upper lobe 2008; no evidence of disease since treatment ended in 11/2007    Tobacco Use: Social History   Tobacco Use  Smoking Status Former Smoker  . Types: Cigarettes  . Start date: 10/25/1966  . Quit date: 10/25/1998  . Years since quitting: 21.8  Smokeless Tobacco Never Used    Labs: Recent Review Flowsheet Data    Labs for ITP Cardiac and Pulmonary Rehab Latest Ref Rng & Units 01/04/2016 12/27/2017 12/28/2019 04/26/2020 04/26/2020   Cholestrol <200 mg/dL 120 94 128 - -   LDLCALC mg/dL (calc) 51 42 61 - -   HDL > OR = 40 mg/dL 47 39(L) 42 - -   Trlycerides <150 mg/dL 109 64 175(H) - -   Hemoglobin A1c <5.7 % - - - - -   PHART 7.35 - 7.45 - - - - 7.386   PCO2ART 32 - 48 mmHg - - - - 45.1   HCO3 20.0 - 28.0 mmol/L - - - 25.1 27.0   TCO2 22 - 32 mmol/L - - - 26 28   ACIDBASEDEF 0.0 - 2.0 mmol/L - - - - -  O2SAT % - - - 94.0 72.0      Capillary Blood Glucose: No results found for: GLUCAP   Exercise Target Goals: Exercise Program Goal: Individual exercise prescription set using results from initial 6 min walk test and THRR while considering  patient's activity barriers and safety.   Exercise Prescription Goal: Starting with aerobic activity 30 plus minutes a day, 3 days per week for initial exercise prescription. Provide home exercise prescription and guidelines that participant acknowledges understanding prior to discharge.  Activity Barriers & Risk Stratification:   6 Minute Walk:  6 Minute Walk    Row Name 07/14/20 1501         6 Minute Walk   Phase Initial     Distance 1200 feet     Walk Time 6 minutes     # of Rest Breaks 0      MPH 2.27     METS 2.51     RPE 11     VO2 Peak 8.78     Symptoms No     Resting HR 69 bpm     Resting BP 118/60     Resting Oxygen Saturation  96 %     Exercise Oxygen Saturation  during 6 min walk 96 %     Max Ex. HR 86 bpm     Max Ex. BP 146/76     2 Minute Post BP 130/76            Oxygen Initial Assessment:   Oxygen Re-Evaluation:   Oxygen Discharge (Final Oxygen Re-Evaluation):   Initial Exercise Prescription:  Initial Exercise Prescription - 07/14/20 1500      Date of Initial Exercise RX and Referring Provider   Date 07/14/20    Referring Provider Dr. Harrington Challenger    Expected Discharge Date 10/07/20      Treadmill   MPH 1.5    Grade 0    Minutes 17    METs 2.15      NuStep   Level 2    SPM 80    Minutes 22      Prescription Details   Frequency (times per week) 2    Duration Progress to 30 minutes of continuous aerobic without signs/symptoms of physical distress      Intensity   THRR 40-80% of Max Heartrate 60-121    Ratings of Perceived Exertion 11-13    Perceived Dyspnea 0-4      Resistance Training   Training Prescription Yes    Weight 3 lbs    Reps 10-15           Perform Capillary Blood Glucose checks as needed.  Exercise Prescription Changes:   Exercise Prescription Changes    Row Name 07/25/20 1400 08/01/20 1500 08/15/20 1500 08/17/20 1120       Response to Exercise   Blood Pressure (Admit) 140/76 116/80 106/64 138/72    Blood Pressure (Exercise) 138/74 126/60 130/68 140/64    Blood Pressure (Exit) 116/70 126/80 108/62 122/68    Heart Rate (Admit) 84 bpm 78 bpm 78 bpm 73 bpm    Heart Rate (Exercise) 115 bpm 98 bpm 101 bpm 104 bpm    Heart Rate (Exit) 75 bpm 85 bpm 86 bpm 82 bpm    Rating of Perceived Exertion (Exercise) 12 11 11 11     Duration Continue with 30 min of aerobic exercise without signs/symptoms of physical distress. Continue with 30 min of aerobic exercise without signs/symptoms of physical distress. Continue with 30  min of aerobic exercise without signs/symptoms of physical distress. Continue with 30 min of aerobic exercise without signs/symptoms of physical distress.    Intensity THRR unchanged THRR unchanged THRR unchanged THRR unchanged      Progression   Progression Continue to progress workloads to maintain intensity without signs/symptoms of physical distress. Continue to progress workloads to maintain intensity without signs/symptoms of physical distress. Continue to progress workloads to maintain intensity without signs/symptoms of physical distress. Continue to progress workloads to maintain intensity without signs/symptoms of physical distress.      Resistance Training   Training Prescription Yes Yes Yes Yes    Weight 3 lbs 5 lbs 5 lbs 5 lbs    Reps 10-15 10-15 10-15 10-15    Time 10 Minutes 10 Minutes 10 Minutes 10 Minutes      Treadmill   MPH 1.7 1.9 2 2.1    Grade 0 0 1 0    Minutes 17 17 17 17     METs 2.3 2.45 2.81 2.61      NuStep   Level 2 2 4 4     SPM 98 99 83 94    Minutes 22 22 22 22     METs 1.9 1.9 1.8 1.9           Exercise Comments:   Exercise Comments    Row Name 07/18/20 1223           Exercise Comments Pt completed his first day of exercise in cardiac rehab today. He was able to tolerate exercise well without any complaints.              Exercise Goals and Review:   Exercise Goals    Row Name 07/14/20 1505 08/01/20 1515 08/22/20 1509         Exercise Goals   Increase Physical Activity Yes Yes Yes     Intervention Provide advice, education, support and counseling about physical activity/exercise needs.;Develop an individualized exercise prescription for aerobic and resistive training based on initial evaluation findings, risk stratification, comorbidities and participant's personal goals. Provide advice, education, support and counseling about physical activity/exercise needs.;Develop an individualized exercise prescription for aerobic and resistive  training based on initial evaluation findings, risk stratification, comorbidities and participant's personal goals. Provide advice, education, support and counseling about physical activity/exercise needs.;Develop an individualized exercise prescription for aerobic and resistive training based on initial evaluation findings, risk stratification, comorbidities and participant's personal goals.     Expected Outcomes Short Term: Attend rehab on a regular basis to increase amount of physical activity.;Long Term: Exercising regularly at least 3-5 days a week.;Long Term: Add in home exercise to make exercise part of routine and to increase amount of physical activity. Short Term: Attend rehab on a regular basis to increase amount of physical activity.;Long Term: Exercising regularly at least 3-5 days a week.;Long Term: Add in home exercise to make exercise part of routine and to increase amount of physical activity. Short Term: Attend rehab on a regular basis to increase amount of physical activity.;Long Term: Add in home exercise to make exercise part of routine and to increase amount of physical activity.;Long Term: Exercising regularly at least 3-5 days a week.     Increase Strength and Stamina Yes Yes Yes     Intervention Provide advice, education, support and counseling about physical activity/exercise needs.;Develop an individualized exercise prescription for aerobic and resistive training based on initial evaluation findings, risk stratification, comorbidities and participant's personal goals. Provide advice, education, support and counseling about physical  activity/exercise needs.;Develop an individualized exercise prescription for aerobic and resistive training based on initial evaluation findings, risk stratification, comorbidities and participant's personal goals. Provide advice, education, support and counseling about physical activity/exercise needs.;Develop an individualized exercise prescription for  aerobic and resistive training based on initial evaluation findings, risk stratification, comorbidities and participant's personal goals.     Expected Outcomes Short Term: Increase workloads from initial exercise prescription for resistance, speed, and METs.;Short Term: Perform resistance training exercises routinely during rehab and add in resistance training at home;Long Term: Improve cardiorespiratory fitness, muscular endurance and strength as measured by increased METs and functional capacity (6MWT) Short Term: Increase workloads from initial exercise prescription for resistance, speed, and METs.;Short Term: Perform resistance training exercises routinely during rehab and add in resistance training at home;Long Term: Improve cardiorespiratory fitness, muscular endurance and strength as measured by increased METs and functional capacity (6MWT) Short Term: Increase workloads from initial exercise prescription for resistance, speed, and METs.;Short Term: Perform resistance training exercises routinely during rehab and add in resistance training at home;Long Term: Improve cardiorespiratory fitness, muscular endurance and strength as measured by increased METs and functional capacity (6MWT)     Able to understand and use rate of perceived exertion (RPE) scale Yes Yes Yes     Intervention Provide education and explanation on how to use RPE scale Provide education and explanation on how to use RPE scale Provide education and explanation on how to use RPE scale     Expected Outcomes Short Term: Able to use RPE daily in rehab to express subjective intensity level;Long Term:  Able to use RPE to guide intensity level when exercising independently Short Term: Able to use RPE daily in rehab to express subjective intensity level;Long Term:  Able to use RPE to guide intensity level when exercising independently Short Term: Able to use RPE daily in rehab to express subjective intensity level;Long Term:  Able to use RPE to  guide intensity level when exercising independently     Knowledge and understanding of Target Heart Rate Range (THRR) Yes Yes Yes     Intervention Provide education and explanation of THRR including how the numbers were predicted and where they are located for reference Provide education and explanation of THRR including how the numbers were predicted and where they are located for reference Provide education and explanation of THRR including how the numbers were predicted and where they are located for reference     Expected Outcomes Short Term: Able to state/look up THRR;Long Term: Able to use THRR to govern intensity when exercising independently;Short Term: Able to use daily as guideline for intensity in rehab Short Term: Able to state/look up THRR;Long Term: Able to use THRR to govern intensity when exercising independently;Short Term: Able to use daily as guideline for intensity in rehab Short Term: Able to state/look up THRR;Long Term: Able to use THRR to govern intensity when exercising independently;Short Term: Able to use daily as guideline for intensity in rehab     Able to check pulse independently Yes Yes Yes     Intervention Provide education and demonstration on how to check pulse in carotid and radial arteries.;Review the importance of being able to check your own pulse for safety during independent exercise Provide education and demonstration on how to check pulse in carotid and radial arteries.;Review the importance of being able to check your own pulse for safety during independent exercise Provide education and demonstration on how to check pulse in carotid and radial arteries.;Review the importance of  being able to check your own pulse for safety during independent exercise     Expected Outcomes Short Term: Able to explain why pulse checking is important during independent exercise;Long Term: Able to check pulse independently and accurately Short Term: Able to explain why pulse checking is  important during independent exercise;Long Term: Able to check pulse independently and accurately Short Term: Able to explain why pulse checking is important during independent exercise;Long Term: Able to check pulse independently and accurately     Understanding of Exercise Prescription Yes Yes Yes     Intervention Provide education, explanation, and written materials on patient's individual exercise prescription Provide education, explanation, and written materials on patient's individual exercise prescription Provide education, explanation, and written materials on patient's individual exercise prescription     Expected Outcomes Short Term: Able to explain program exercise prescription;Long Term: Able to explain home exercise prescription to exercise independently Short Term: Able to explain program exercise prescription;Long Term: Able to explain home exercise prescription to exercise independently Short Term: Able to explain program exercise prescription;Long Term: Able to explain home exercise prescription to exercise independently            Exercise Goals Re-Evaluation :  Exercise Goals Re-Evaluation    Row Name 08/01/20 1515 08/22/20 1510           Exercise Goal Re-Evaluation   Exercise Goals Review Increase Strength and Stamina;Able to understand and use rate of perceived exertion (RPE) scale;Knowledge and understanding of Target Heart Rate Range (THRR);Able to check pulse independently;Understanding of Exercise Prescription Increase Physical Activity;Increase Strength and Stamina;Able to understand and use rate of perceived exertion (RPE) scale;Knowledge and understanding of Target Heart Rate Range (THRR);Able to check pulse independently;Understanding of Exercise Prescription      Comments Pt has attended 6 exercise sessions. He is unmotivated and does not push himself. His walking on the treadmill has improved. He currently exercises at 1.9 METs on the stepper, but could likely do much  more if he tried. Will continue to monitor and progress as able. Pt has completed 13 exercise sessions. He has been consistently doing the same intensity on the NuStep and has improved a little on the treadmill. He is currently exercising at 1.9 METs on the NuStep. Will continue to monitor and progress exercise as able.      Expected Outcomes Through exercise at rehab and by engaging in a home exercise plan, the patient will be able to reach their goals. Through exercise at rehab and by engaging in a home exercise program the patient will reach their goals.              Discharge Exercise Prescription (Final Exercise Prescription Changes):  Exercise Prescription Changes - 08/17/20 1120      Response to Exercise   Blood Pressure (Admit) 138/72    Blood Pressure (Exercise) 140/64    Blood Pressure (Exit) 122/68    Heart Rate (Admit) 73 bpm    Heart Rate (Exercise) 104 bpm    Heart Rate (Exit) 82 bpm    Rating of Perceived Exertion (Exercise) 11    Duration Continue with 30 min of aerobic exercise without signs/symptoms of physical distress.    Intensity THRR unchanged      Progression   Progression Continue to progress workloads to maintain intensity without signs/symptoms of physical distress.      Resistance Training   Training Prescription Yes    Weight 5 lbs    Reps 10-15    Time 10  Minutes      Treadmill   MPH 2.1    Grade 0    Minutes 17    METs 2.61      NuStep   Level 4    SPM 94    Minutes 22    METs 1.9           Nutrition:  Target Goals: Understanding of nutrition guidelines, daily intake of sodium 1500mg , cholesterol 200mg , calories 30% from fat and 7% or less from saturated fats, daily to have 5 or more servings of fruits and vegetables.  Biometrics:  Pre Biometrics - 07/14/20 1506      Pre Biometrics   Height 5\' 10"  (1.778 m)    Weight 101.1 kg    Waist Circumference 45.5 inches    Hip Circumference 45.5 inches    Waist to Hip Ratio 1 %    BMI  (Calculated) 31.98    Triceps Skinfold 27 mm    % Body Fat 34 %    Grip Strength 38 kg    Flexibility 16.5 in    Single Leg Stand 6.61 seconds            Nutrition Therapy Plan and Nutrition Goals:  Nutrition Therapy & Goals - 08/19/20 1502      Personal Nutrition Goals   Comments We continue to provide education through hand-outs regarding nutrition and making healthier choices.      Intervention Plan   Intervention Nutrition handout(s) given to patient.           Nutrition Assessments:  Nutrition Assessments - 07/14/20 1501      MEDFICTS Scores   Pre Score 19          MEDIFICTS Score Key:  ?70 Need to make dietary changes   40-70 Heart Healthy Diet  ? 40 Therapeutic Level Cholesterol Diet   Picture Your Plate Scores:  <16 Unhealthy dietary pattern with much room for improvement.  41-50 Dietary pattern unlikely to meet recommendations for good health and room for improvement.  51-60 More healthful dietary pattern, with some room for improvement.   >60 Healthy dietary pattern, although there may be some specific behaviors that could be improved.    Nutrition Goals Re-Evaluation:   Nutrition Goals Discharge (Final Nutrition Goals Re-Evaluation):   Psychosocial: Target Goals: Acknowledge presence or absence of significant depression and/or stress, maximize coping skills, provide positive support system. Participant is able to verbalize types and ability to use techniques and skills needed for reducing stress and depression.  Initial Review & Psychosocial Screening:  Initial Psych Review & Screening - 07/14/20 1503      Initial Review   Current issues with None Identified      Family Dynamics   Good Support System? Yes    Comments Patient lives with his wife of many years. He denies any depression or anxiety. He has children and is getting ready to enjoy a new grandchild. He has a positive outlook regarding his future. Will continue to monitor.       Barriers   Psychosocial barriers to participate in program There are no identifiable barriers or psychosocial needs.;The patient should benefit from training in stress management and relaxation.      Screening Interventions   Interventions Encouraged to exercise           Quality of Life Scores:  Quality of Life - 07/14/20 1501      Quality of Life   Select Quality of Life  Quality of Life Scores   Health/Function Pre 24.4 %    Socioeconomic Pre 28.5 %    Psych/Spiritual Pre 24 %    Family Pre 28.8 %    GLOBAL Pre 25.89 %          Scores of 19 and below usually indicate a poorer quality of life in these areas.  A difference of  2-3 points is a clinically meaningful difference.  A difference of 2-3 points in the total score of the Quality of Life Index has been associated with significant improvement in overall quality of life, self-image, physical symptoms, and general health in studies assessing change in quality of life.  PHQ-9: Recent Review Flowsheet Data    Depression screen [facility] 2/9 07/14/2020   Decreased Interest 0   Down, Depressed, Hopeless 0   PHQ - 2 Score 0   Altered sleeping 0   Tired, decreased energy 1   Change in appetite 0   Feeling bad or failure about yourself  0   Trouble concentrating 0   Moving slowly or fidgety/restless 0   Suicidal thoughts 0   PHQ-9 Score 1   Difficult doing work/chores Not difficult at all     Interpretation of Total Score  Total Score Depression Severity:  1-4 = Minimal depression, 5-9 = Mild depression, 10-14 = Moderate depression, 15-19 = Moderately severe depression, 20-27 = Severe depression   Psychosocial Evaluation and Intervention:  Psychosocial Evaluation - 07/14/20 1504      Psychosocial Evaluation & Interventions   Interventions Encouraged to exercise with the program and follow exercise prescription;Stress management education;Relaxation education    Comments Patient's initial QOL score was 25.89 overall  and his PHQ-9 score was 1 with no psychosocial issues identified at his orientation visit.    Expected Outcomes Patient will have no psychosocial issues identified at discharge.    Continue Psychosocial Services  No Follow up required           Psychosocial Re-Evaluation:  Psychosocial Re-Evaluation    [place] Name 08/01/20 1257 08/19/20 1503           Psychosocial Re-Evaluation   Current issues with None Identified None Identified      Comments Patient continues to have no psychosocial issues identified. Will continue to monitor. Patient continues to have no psychosocial issues identified. Patient works hard during his sessions and seems to enjoy coming. He is very interactive with other patient in his class and with staff. Will continue to monitor.      Expected Outcomes Patient will have no psychosocial issues identified at discharge. Patient will have no psychosocial issues identified at discharge.      Interventions Relaxation education;Encouraged to attend Cardiac Rehabilitation for the exercise;Stress management education Relaxation education;Encouraged to attend Cardiac Rehabilitation for the exercise;Stress management education      Continue Psychosocial Services  No Follow up required No Follow up required             Psychosocial Discharge (Final Psychosocial Re-Evaluation):  Psychosocial Re-Evaluation - 08/19/20 1503      Psychosocial Re-Evaluation   Current issues with None Identified    Comments Patient continues to have no psychosocial issues identified. Patient works hard during his sessions and seems to enjoy coming. He is very interactive with other patient in his class and with staff. Will continue to monitor.    Expected Outcomes Patient will have no psychosocial issues identified at discharge.    Interventions Relaxation education;Encouraged to attend Cardiac Rehabilitation for  the exercise;Stress management education    Continue Psychosocial Services  No Follow up  required           Vocational Rehabilitation: Provide vocational rehab assistance to qualifying candidates.   Vocational Rehab Evaluation & Intervention:  Vocational Rehab - 07/14/20 1501      Initial Vocational Rehab Evaluation & Intervention   Assessment shows need for Vocational Rehabilitation No      Vocational Rehab Re-Evaulation   Comments Patient is a retired Emergency planning/management officer. He is not interested in returning to work and does not need vocational rehab.           Education: Education Goals: Education classes will be provided on a weekly basis, covering required topics. Participant will state understanding/return demonstration of topics presented.  Learning Barriers/Preferences:  Learning Barriers/Preferences - 07/14/20 1501      Learning Barriers/Preferences   Learning Barriers None    Learning Preferences Written Material;Video;Audio;Skilled Demonstration           Education Topics: Hypertension, Hypertension Reduction -Define heart disease and high blood pressure. Discus how high blood pressure affects the body and ways to reduce high blood pressure.   Exercise and Your Heart -Discuss why it is important to exercise, the FITT principles of exercise, normal and abnormal responses to exercise, and how to exercise safely.   CARDIAC REHAB PHASE II EXERCISE from 08/17/2020 in [place]  Date 07/20/20  Educator DF  Instruction Review Code 2- Demonstrated Understanding      Angina -Discuss definition of angina, causes of angina, treatment of angina, and how to decrease risk of having angina.   CARDIAC REHAB PHASE II EXERCISE from 08/17/2020 in [place]  Date 07/29/20  Educator DF  Instruction Review Code 2- Demonstrated Understanding      Cardiac Medications -Review what the following cardiac medications are used for, how they affect the body, and side effects that may occur when taking the medications.   Medications include Aspirin, Beta blockers, calcium channel blockers, ACE Inhibitors, angiotensin receptor blockers, diuretics, digoxin, and antihyperlipidemics.   CARDIAC REHAB PHASE II EXERCISE from 08/17/2020 in [place]  Date 08/03/20  Educator DF  Instruction Review Code 2- Demonstrated Understanding      Congestive Heart Failure -Discuss the definition of CHF, how to live with CHF, the signs and symptoms of CHF, and how keep track of weight and sodium intake.   CARDIAC REHAB PHASE II EXERCISE from 08/17/2020 in [place]  Date 08/10/20  Educator DF  Instruction Review Code 2- Demonstrated Understanding      Heart Disease and Intimacy -Discus the effect sexual activity has on the heart, how changes occur during intimacy as we age, and safety during sexual activity.   CARDIAC REHAB PHASE II EXERCISE from 08/17/2020 in [place]  Date 08/17/20  Educator DF  Instruction Review Code 2- Demonstrated Understanding      Smoking Cessation / COPD -Discuss different methods to quit smoking, the health benefits of quitting smoking, and the definition of COPD.   Nutrition I: Fats -Discuss the types of cholesterol, what cholesterol does to the heart, and how cholesterol levels can be controlled.   Nutrition II: Labels -Discuss the different components of food labels and how to read food label   Heart Parts/Heart Disease and PAD -Discuss the anatomy of the heart, the pathway of blood circulation through the heart, and these are affected by heart disease.   Stress I: Signs  and Symptoms -Discuss the causes of stress, how stress may lead to anxiety and depression, and ways to limit stress.   Stress II: Relaxation -Discuss different types of relaxation techniques to limit stress.   Warning Signs of Stroke / TIA -Discuss definition of a stroke, what the signs and symptoms are of a stroke, and how to identify  when someone is having stroke.   Knowledge Questionnaire Score:  Knowledge Questionnaire Score - 07/14/20 1501      Knowledge Questionnaire Score   Pre Score 23/24           Core Components/Risk Factors/Patient Goals at Admission:  Personal Goals and Risk Factors at Admission - 07/14/20 1502      Core Components/Risk Factors/Patient Goals on Admission    Weight Management Obesity    Personal Goal Other Yes    Personal Goal Increase strength and stamina. Increase strength in his legs especially. Maintain his health.    Intervention Patient will attend CR 2 days/week and supplement with exercise at home 3 days/week.    Expected Outcomes Patient will meet both program and personal goals.           Core Components/Risk Factors/Patient Goals Review:   Goals and Risk Factor Review    Row Name 08/01/20 1258 08/19/20 1503           Core Components/Risk Factors/Patient Goals Review   Personal Goals Review Weight Management/Obesity Weight Management/Obesity      Review Patient has completed 7 sessions losing 3 lbs since his initial visit. He has multiple risk factors for CAD and is participating in CR to modify risk factors. His personal goals are to increase strength and stamina; increase strength in his legs; and maintain his health. His attendance is consistent and he is doing well in the program with progression. Will continue to monitor. Patient has completed 14 sessions maintaining his weight in last 30 day review. He continues to do well in the program with progressions and consistent attendance. His b/p is WNL. His personal goals are increase strength and stamin; increase strength in legs; and to maintain his health. His balance has improved and his gait on the treadmill has improved. He feels he is working toward meeting his goals. Will continue to monitor for progress.      Expected Outcomes Patient will complete the program meeting both his personal and program goals. Patient  will complete the program meeting both his personal and program goals.             Core Components/Risk Factors/Patient Goals at Discharge (Final Review):   Goals and Risk Factor Review - 08/19/20 1503      Core Components/Risk Factors/Patient Goals Review   Personal Goals Review Weight Management/Obesity    Review Patient has completed 14 sessions maintaining his weight in last 30 day review. He continues to do well in the program with progressions and consistent attendance. His b/p is WNL. His personal goals are increase strength and stamin; increase strength in legs; and to maintain his health. His balance has improved and his gait on the treadmill has improved. He feels he is working toward meeting his goals. Will continue to monitor for progress.    Expected Outcomes Patient will complete the program meeting both his personal and program goals.           ITP Comments:   Comments: ITP REVIEW Pt is making expected progress toward Cardiac Rehab goals after completing 14 sessions. Recommend continued exercise, life style modification,  education, and increased stamina and strength.

## 2020-08-24 NOTE — Progress Notes (Signed)
Daily Session Note  Patient Details  Name: Jeremy Lopez MRN: [id number] Date of Birth: 24-[name]-1951 Referring Provider:     CARDIAC REHAB PHASE II ORIENTATION from 07/14/2020 in [place]  Referring Provider Dr. Harrington Challenger      Encounter Date: 08/24/2020  Check In:  Session Check In - 08/24/20 1100      Check-In   Supervising physician immediately available to respond to emergencies [facility] MD immediately available    Physician(s) Dr. Domenic Polite    Location [facility]    Staff Present Aundra Dubin, RN, Bjorn Loser, MS, ACSM-CEP, Exercise Physiologist    Virtual Visit No    Medication changes reported     No    Fall or balance concerns reported    No    Tobacco Cessation No Change    Warm-up and Cool-down Performed as group-led instruction    Resistance Training Performed Yes    VAD Patient? No    PAD/SET Patient? No      Pain Assessment   Currently in Pain? No/denies    Multiple Pain Sites No           Capillary Blood Glucose: No results found for this or any previous visit (from the past 24 hour(s)).    Social History   Tobacco Use  Smoking Status Former Smoker  . Types: Cigarettes  . Start date: 10/25/1966  . Quit date: 10/25/1998  . Years since quitting: 21.8  Smokeless Tobacco Never Used    Goals Met:  Independence with exercise equipment Exercise tolerated well No report of cardiac concerns or symptoms Strength training completed today  Goals Unmet:  Not Applicable  Comments: checkout time is 1200   Dr. Kathie Dike is Medical Director for [facility] Pulmonary Rehab.

## 2020-08-26 ENCOUNTER — Other Ambulatory Visit: Payer: Self-pay

## 2020-08-26 ENCOUNTER — Encounter (HOSPITAL_COMMUNITY)
Admission: RE | Admit: 2020-08-26 | Discharge: 2020-08-26 | Disposition: A | Discharge status: Home / Self Care | Payer: Medicare Other | Source: Ambulatory Visit | Attending: Internal Medicine | Admitting: Internal Medicine

## 2020-08-26 DIAGNOSIS — Z79899 Other long term (current) drug therapy: Secondary | ICD-10-CM | POA: Diagnosis not present

## 2020-08-26 DIAGNOSIS — I2510 Atherosclerotic heart disease of native coronary artery without angina pectoris: Secondary | ICD-10-CM | POA: Diagnosis not present

## 2020-08-26 DIAGNOSIS — Z95820 Peripheral vascular angioplasty status with implants and grafts: Secondary | ICD-10-CM | POA: Diagnosis not present

## 2020-08-26 DIAGNOSIS — I10 Essential (primary) hypertension: Secondary | ICD-10-CM | POA: Diagnosis not present

## 2020-08-26 DIAGNOSIS — Z87891 Personal history of nicotine dependence: Secondary | ICD-10-CM | POA: Diagnosis not present

## 2020-08-26 NOTE — Progress Notes (Signed)
Daily Session Note  Patient Details  Name: Jeremy Lopez MRN: [id number] Date of Birth: October 16, 1950 Referring Provider:     CARDIAC REHAB PHASE II ORIENTATION from 07/14/2020 in [place]  Referring Provider Dr. Harrington Challenger      Encounter Date: 08/26/2020  Check In:  Session Check In - 08/26/20 1100      Check-In   Supervising physician immediately available to respond to emergencies [facility] MD immediately available    Physician(s) Dr. Harl Bowie    Location [facility]    Staff Present Aundra Dubin, RN, Bjorn Loser, MS, ACSM-CEP, Exercise Physiologist    Virtual Visit No    Medication changes reported     No    Fall or balance concerns reported    No    Tobacco Cessation No Change    Warm-up and Cool-down Performed as group-led instruction    Resistance Training Performed Yes    VAD Patient? No    PAD/SET Patient? No      Pain Assessment   Currently in Pain? No/denies    Multiple Pain Sites No           Capillary Blood Glucose: No results found for this or any previous visit (from the past 24 hour(s)).    Social History   Tobacco Use  Smoking Status Former Smoker  . Types: Cigarettes  . Start date: 10/25/1966  . Quit date: 10/25/1998  . Years since quitting: 21.8  Smokeless Tobacco Never Used    Goals Met:  Independence with exercise equipment Exercise tolerated well No report of cardiac concerns or symptoms Strength training completed today  Goals Unmet:  Not Applicable  Comments: checkout time is 1200   Dr. Kathie Dike is Medical Director for [facility] Pulmonary Rehab.

## 2020-08-29 ENCOUNTER — Other Ambulatory Visit: Payer: Self-pay

## 2020-08-29 ENCOUNTER — Encounter (HOSPITAL_COMMUNITY)
Admission: RE | Admit: 2020-08-29 | Discharge: 2020-08-29 | Disposition: A | Discharge status: Home / Self Care | Payer: Medicare Other | Source: Ambulatory Visit | Attending: Internal Medicine | Admitting: Internal Medicine

## 2020-08-29 DIAGNOSIS — Z87891 Personal history of nicotine dependence: Secondary | ICD-10-CM | POA: Diagnosis not present

## 2020-08-29 DIAGNOSIS — I10 Essential (primary) hypertension: Secondary | ICD-10-CM | POA: Diagnosis not present

## 2020-08-29 DIAGNOSIS — I2510 Atherosclerotic heart disease of native coronary artery without angina pectoris: Secondary | ICD-10-CM | POA: Diagnosis not present

## 2020-08-29 DIAGNOSIS — Z95820 Peripheral vascular angioplasty status with implants and grafts: Secondary | ICD-10-CM | POA: Diagnosis not present

## 2020-08-29 DIAGNOSIS — Z79899 Other long term (current) drug therapy: Secondary | ICD-10-CM | POA: Diagnosis not present

## 2020-08-29 NOTE — Progress Notes (Signed)
Daily Session Note  Patient Details  Name: Jeremy Lopez MRN: [id number] Date of Birth: November 24, 1950 Referring Provider:     CARDIAC REHAB PHASE II ORIENTATION from 07/14/2020 in [place]  Referring Provider Dr. Harrington Challenger      Encounter Date: 08/29/2020  Check In:  Session Check In - 08/29/20 1056      Check-In   Supervising physician immediately available to respond to emergencies [facility] MD immediately available    Physician(s) Dr. Harrington Challenger    Location [facility]    Staff Present Aundra Dubin, RN, Bjorn Loser, MS, ACSM-CEP, Exercise Physiologist;Mad[place] Audria Nine, MS, Exercise Physiologist    Virtual Visit No    Medication changes reported     No    Fall or balance concerns reported    No    Tobacco Cessation No Change    Warm-up and Cool-down Performed as group-led instruction    Resistance Training Performed Yes    VAD Patient? No    PAD/SET Patient? No      Pain Assessment   Currently in Pain? No/denies    Multiple Pain Sites No           Capillary Blood Glucose: No results found for this or any previous visit (from the past 24 hour(s)).    Social History   Tobacco Use  Smoking Status Former Smoker  . Types: Cigarettes  . Start date: 10/25/1966  . Quit date: 10/25/1998  . Years since quitting: 21.8  Smokeless Tobacco Never Used    Goals Met:  Independence with exercise equipment Exercise tolerated well No report of cardiac concerns or symptoms Strength training completed today  Goals Unmet:  Not Applicable  Comments: checkout time is 1200   Dr. Kathie Dike is Medical Director for [facility] Pulmonary Rehab.

## 2020-08-29 NOTE — Progress Notes (Signed)
I have reviewed a Home Exercise Prescription with Jeremy Lopez is currently exercising at home.  The patient was advised to walk 5-7 days a week for 30-45 minutes.  Tildon and I discussed how to progress their exercise prescription. The patient stated that their goals were to get stronger. The patient stated that they understand the exercise prescription.  We reviewed exercise guidelines, target heart rate during exercise, RPE Scale, weather conditions, NTG use, endpoints for exercise, warmup and cool down.  Patient is encouraged to come to me with any questions. I will continue to follow up with the patient to assist them with progression and safety.

## 2020-08-31 ENCOUNTER — Other Ambulatory Visit: Payer: Self-pay

## 2020-08-31 ENCOUNTER — Encounter (HOSPITAL_COMMUNITY)
Admission: RE | Admit: 2020-08-31 | Discharge: 2020-08-31 | Disposition: A | Discharge status: Home / Self Care | Payer: Medicare Other | Source: Ambulatory Visit | Attending: Internal Medicine | Admitting: Internal Medicine

## 2020-08-31 VITALS — Wt 222.2 lb

## 2020-08-31 DIAGNOSIS — Z79899 Other long term (current) drug therapy: Secondary | ICD-10-CM | POA: Diagnosis not present

## 2020-08-31 DIAGNOSIS — I2510 Atherosclerotic heart disease of native coronary artery without angina pectoris: Secondary | ICD-10-CM | POA: Diagnosis not present

## 2020-08-31 DIAGNOSIS — Z95820 Peripheral vascular angioplasty status with implants and grafts: Secondary | ICD-10-CM | POA: Diagnosis not present

## 2020-08-31 DIAGNOSIS — I10 Essential (primary) hypertension: Secondary | ICD-10-CM | POA: Diagnosis not present

## 2020-08-31 DIAGNOSIS — Z87891 Personal history of nicotine dependence: Secondary | ICD-10-CM | POA: Diagnosis not present

## 2020-08-31 NOTE — Progress Notes (Signed)
Daily Session Note  Patient Details  Name: Jeremy Lopez MRN: [id number] Date of Birth: 08-24-1951 Referring Provider:     CARDIAC REHAB PHASE II ORIENTATION from 07/14/2020 in [place]  Referring Provider Dr. Harrington Challenger      Encounter Date: 08/31/2020  Check In:  Session Check In - 08/31/20 1100      Check-In   Supervising physician immediately available to respond to emergencies [facility] MD immediately available    Physician(s) Dr. Domenic Polite    Location [facility]    Staff Present Cathren Harsh, MS, Exercise Physiologist;Debra Wynetta Emery, RN, BSN    Virtual Visit No    Medication changes reported     No    Fall or balance concerns reported    No    Tobacco Cessation No Change    Warm-up and Cool-down Performed as group-led instruction    Resistance Training Performed Yes    VAD Patient? No    PAD/SET Patient? No      Pain Assessment   Currently in Pain? No/denies    Multiple Pain Sites No           Capillary Blood Glucose: No results found for this or any previous visit (from the past 24 hour(s)).    Social History   Tobacco Use  Smoking Status Former Smoker  . Types: Cigarettes  . Start date: 10/25/1966  . Quit date: 10/25/1998  . Years since quitting: 21.8  Smokeless Tobacco Never Used    Goals Met:  Independence with exercise equipment Exercise tolerated well No report of cardiac concerns or symptoms Strength training completed today  Goals Unmet:  Not Applicable  Comments: check out 1200   Dr. Kathie Dike is Medical Director for [facility] Pulmonary Rehab.

## 2020-09-02 ENCOUNTER — Encounter (HOSPITAL_COMMUNITY): Payer: Medicare Other

## 2020-09-05 ENCOUNTER — Encounter (HOSPITAL_COMMUNITY): Payer: Medicare Other

## 2020-09-07 ENCOUNTER — Other Ambulatory Visit: Payer: Self-pay

## 2020-09-07 ENCOUNTER — Encounter (HOSPITAL_COMMUNITY)
Admission: RE | Admit: 2020-09-07 | Discharge: 2020-09-07 | Disposition: A | Discharge status: Home / Self Care | Payer: Medicare Other | Source: Ambulatory Visit | Attending: Internal Medicine | Admitting: Internal Medicine

## 2020-09-07 DIAGNOSIS — Z87891 Personal history of nicotine dependence: Secondary | ICD-10-CM | POA: Diagnosis not present

## 2020-09-07 DIAGNOSIS — I10 Essential (primary) hypertension: Secondary | ICD-10-CM | POA: Diagnosis not present

## 2020-09-07 DIAGNOSIS — Z79899 Other long term (current) drug therapy: Secondary | ICD-10-CM | POA: Diagnosis not present

## 2020-09-07 DIAGNOSIS — I2510 Atherosclerotic heart disease of native coronary artery without angina pectoris: Secondary | ICD-10-CM | POA: Diagnosis not present

## 2020-09-07 DIAGNOSIS — Z95820 Peripheral vascular angioplasty status with implants and grafts: Secondary | ICD-10-CM | POA: Diagnosis not present

## 2020-09-07 NOTE — Progress Notes (Signed)
Daily Session Note  Patient Details  Name: Jeremy Lopez MRN: [id number] Date of Birth: 1950/11/03 Referring Provider:     CARDIAC REHAB PHASE II ORIENTATION from 07/14/2020 in [place]  Referring Provider Dr. Harrington Challenger      Encounter Date: 09/07/2020  Check In:  Session Check In - 09/07/20 1102      Check-In   Supervising physician immediately available to respond to emergencies [facility] MD immediately available    Physician(s) Dr. Domenic Polite    Location [facility]    Staff Present Cathren Harsh, MS, Exercise Physiologist;Debra Wynetta Emery, RN, Bjorn Loser, MS, ACSM-CEP, Exercise Physiologist    Virtual Visit No    Medication changes reported     No    Fall or balance concerns reported    No    Tobacco Cessation No Change    Warm-up and Cool-down Performed as group-led instruction    Resistance Training Performed Yes    VAD Patient? No    PAD/SET Patient? No      Pain Assessment   Currently in Pain? No/denies    Multiple Pain Sites No           Capillary Blood Glucose: No results found for this or any previous visit (from the past 24 hour(s)).    Social History   Tobacco Use  Smoking Status Former Smoker  . Types: Cigarettes  . Start date: 10/25/1966  . Quit date: 10/25/1998  . Years since quitting: 21.8  Smokeless Tobacco Never Used    Goals Met:  Independence with exercise equipment Exercise tolerated well No report of cardiac concerns or symptoms Strength training completed today  Goals Unmet:  Not Applicable  Comments: checkout time is 1200   Dr. Kathie Dike is Medical Director for [facility] Pulmonary Rehab.

## 2020-09-09 ENCOUNTER — Other Ambulatory Visit: Payer: Self-pay

## 2020-09-09 ENCOUNTER — Encounter (HOSPITAL_COMMUNITY)
Admission: RE | Admit: 2020-09-09 | Discharge: 2020-09-09 | Disposition: A | Discharge status: Home / Self Care | Payer: Medicare Other | Source: Ambulatory Visit | Attending: Internal Medicine | Admitting: Internal Medicine

## 2020-09-09 DIAGNOSIS — I10 Essential (primary) hypertension: Secondary | ICD-10-CM | POA: Diagnosis not present

## 2020-09-09 DIAGNOSIS — Z87891 Personal history of nicotine dependence: Secondary | ICD-10-CM | POA: Diagnosis not present

## 2020-09-09 DIAGNOSIS — Z95820 Peripheral vascular angioplasty status with implants and grafts: Secondary | ICD-10-CM | POA: Diagnosis not present

## 2020-09-09 DIAGNOSIS — I2510 Atherosclerotic heart disease of native coronary artery without angina pectoris: Secondary | ICD-10-CM | POA: Diagnosis not present

## 2020-09-09 DIAGNOSIS — Z79899 Other long term (current) drug therapy: Secondary | ICD-10-CM | POA: Diagnosis not present

## 2020-09-09 NOTE — Progress Notes (Signed)
Daily Session Note  Patient Details  Name: Jeremy Lopez MRN: [id number] Date of Birth: October 28, 1950 Referring Provider:     CARDIAC REHAB PHASE II ORIENTATION from 07/14/2020 in [place]  Referring Provider Dr. Harrington Challenger      Encounter Date: 09/09/2020  Check In:  Session Check In - 09/09/20 1100      Check-In   Supervising physician immediately available to respond to emergencies [facility] MD immediately available    Physician(s) Dr. Harl Bowie    Location [facility]    Staff Present Cathren Harsh, MS, Exercise Physiologist;Debra Wynetta Emery, RN, BSN    Virtual Visit No    Medication changes reported     No    Fall or balance concerns reported    No    Tobacco Cessation No Change    Warm-up and Cool-down Performed as group-led instruction    Resistance Training Performed Yes    VAD Patient? No    PAD/SET Patient? No      Pain Assessment   Currently in Pain? No/denies    Multiple Pain Sites No           Capillary Blood Glucose: No results found for this or any previous visit (from the past 24 hour(s)).    Social History   Tobacco Use  Smoking Status Former Smoker  . Types: Cigarettes  . Start date: 10/25/1966  . Quit date: 10/25/1998  . Years since quitting: 21.8  Smokeless Tobacco Never Used    Goals Met:  Independence with exercise equipment Exercise tolerated well No report of cardiac concerns or symptoms Strength training completed today  Goals Unmet:  Not Applicable  Comments: check out 1200   Dr. Kathie Dike is Medical Director for [facility] Pulmonary Rehab.

## 2020-09-12 ENCOUNTER — Other Ambulatory Visit: Payer: Self-pay

## 2020-09-12 ENCOUNTER — Encounter (HOSPITAL_COMMUNITY)
Admission: RE | Admit: 2020-09-12 | Discharge: 2020-09-12 | Disposition: A | Discharge status: Home / Self Care | Payer: Medicare Other | Source: Ambulatory Visit | Attending: Internal Medicine | Admitting: Internal Medicine

## 2020-09-12 DIAGNOSIS — Z95820 Peripheral vascular angioplasty status with implants and grafts: Secondary | ICD-10-CM

## 2020-09-12 DIAGNOSIS — I10 Essential (primary) hypertension: Secondary | ICD-10-CM | POA: Diagnosis not present

## 2020-09-12 DIAGNOSIS — Z87891 Personal history of nicotine dependence: Secondary | ICD-10-CM | POA: Diagnosis not present

## 2020-09-12 DIAGNOSIS — Z79899 Other long term (current) drug therapy: Secondary | ICD-10-CM | POA: Diagnosis not present

## 2020-09-12 DIAGNOSIS — I2510 Atherosclerotic heart disease of native coronary artery without angina pectoris: Secondary | ICD-10-CM | POA: Diagnosis not present

## 2020-09-12 NOTE — Progress Notes (Signed)
Daily Session Note  Patient Details  Name: Jeremy Lopez MRN: [id number] Date of Birth: 07-03-1951 Referring Provider:     CARDIAC REHAB PHASE II ORIENTATION from 07/14/2020 in [place]  Referring Provider Dr. Harrington Challenger      Encounter Date: 09/12/2020  Check In:  Session Check In - 09/12/20 1100      Check-In   Supervising physician immediately available to respond to emergencies [facility] MD immediately available    Physician(s) Dr. Harl Bowie    Location [facility]    Staff Present Cathren Harsh, MS, Exercise Physiologist;Debra Wynetta Emery, RN, BSN    Virtual Visit No    Medication changes reported     No    Fall or balance concerns reported    No    Tobacco Cessation No Change    Warm-up and Cool-down Performed as group-led instruction    Resistance Training Performed Yes    VAD Patient? No    PAD/SET Patient? No      Pain Assessment   Currently in Pain? No/denies    Multiple Pain Sites No           Capillary Blood Glucose: No results found for this or any previous visit (from the past 24 hour(s)).    Social History   Tobacco Use  Smoking Status Former Smoker  . Types: Cigarettes  . Start date: 10/25/1966  . Quit date: 10/25/1998  . Years since quitting: 21.8  Smokeless Tobacco Never Used    Goals Met:  Independence with exercise equipment Exercise tolerated well No report of cardiac concerns or symptoms Strength training completed today  Goals Unmet:  Not Applicable  Comments: check out 1200   Dr. Kathie Dike is Medical Director for [facility] Pulmonary Rehab.

## 2020-09-14 ENCOUNTER — Encounter (HOSPITAL_COMMUNITY)
Admission: RE | Admit: 2020-09-14 | Discharge: 2020-09-14 | Disposition: A | Discharge status: Home / Self Care | Payer: Medicare Other | Source: Ambulatory Visit | Attending: Internal Medicine | Admitting: Internal Medicine

## 2020-09-14 ENCOUNTER — Other Ambulatory Visit: Payer: Self-pay

## 2020-09-14 DIAGNOSIS — Z95820 Peripheral vascular angioplasty status with implants and grafts: Secondary | ICD-10-CM

## 2020-09-14 DIAGNOSIS — Z87891 Personal history of nicotine dependence: Secondary | ICD-10-CM | POA: Diagnosis not present

## 2020-09-14 DIAGNOSIS — I10 Essential (primary) hypertension: Secondary | ICD-10-CM | POA: Diagnosis not present

## 2020-09-14 DIAGNOSIS — Z79899 Other long term (current) drug therapy: Secondary | ICD-10-CM | POA: Diagnosis not present

## 2020-09-14 DIAGNOSIS — I2510 Atherosclerotic heart disease of native coronary artery without angina pectoris: Secondary | ICD-10-CM | POA: Diagnosis not present

## 2020-09-14 NOTE — Progress Notes (Signed)
Daily Session Note  Patient Details  Name: Jeremy Lopez MRN: [id number] Date of Birth: 10-Jul-1951 Referring Provider:     CARDIAC REHAB PHASE II ORIENTATION from 07/14/2020 in [place]  Referring Provider Dr. Harrington Challenger      Encounter Date: 09/14/2020  Check In:  Session Check In - 09/14/20 1100      Check-In   Supervising physician immediately available to respond to emergencies [facility] MD immediately available    Physician(s) Dr. Harl Bowie    Location [facility]    Staff Present Cathren Harsh, MS, Exercise Physiologist;Debra Wynetta Emery, RN, BSN    Virtual Visit No    Medication changes reported     No    Fall or balance concerns reported    No    Tobacco Cessation No Change    Warm-up and Cool-down Performed as group-led instruction    Resistance Training Performed Yes    VAD Patient? No    PAD/SET Patient? No      Pain Assessment   Currently in Pain? No/denies    Multiple Pain Sites No           Capillary Blood Glucose: No results found for this or any previous visit (from the past 24 hour(s)).    Social History   Tobacco Use  Smoking Status Former Smoker  . Types: Cigarettes  . Start date: 10/25/1966  . Quit date: 10/25/1998  . Years since quitting: 21.9  Smokeless Tobacco Never Used    Goals Met:  Independence with exercise equipment Exercise tolerated well No report of cardiac concerns or symptoms Strength training completed today  Goals Unmet:  Not Applicable  Comments: check out 1200   Dr. Kathie Dike is Medical Director for [facility] Pulmonary Rehab.

## 2020-09-16 ENCOUNTER — Encounter (HOSPITAL_COMMUNITY): Payer: Medicare Other

## 2020-09-19 ENCOUNTER — Other Ambulatory Visit: Payer: Self-pay

## 2020-09-19 ENCOUNTER — Encounter (HOSPITAL_COMMUNITY)
Admission: RE | Admit: 2020-09-19 | Discharge: 2020-09-19 | Disposition: A | Discharge status: Home / Self Care | Payer: Medicare Other | Source: Ambulatory Visit | Attending: Internal Medicine | Admitting: Internal Medicine

## 2020-09-19 VITALS — Wt 223.5 lb

## 2020-09-19 DIAGNOSIS — Z95820 Peripheral vascular angioplasty status with implants and grafts: Secondary | ICD-10-CM

## 2020-09-19 DIAGNOSIS — Z79899 Other long term (current) drug therapy: Secondary | ICD-10-CM | POA: Diagnosis not present

## 2020-09-19 DIAGNOSIS — I10 Essential (primary) hypertension: Secondary | ICD-10-CM | POA: Diagnosis not present

## 2020-09-19 DIAGNOSIS — I2510 Atherosclerotic heart disease of native coronary artery without angina pectoris: Secondary | ICD-10-CM | POA: Diagnosis not present

## 2020-09-19 DIAGNOSIS — Z87891 Personal history of nicotine dependence: Secondary | ICD-10-CM | POA: Diagnosis not present

## 2020-09-19 NOTE — Progress Notes (Signed)
Daily Session Note  Patient Details  Name: Jeremy Lopez MRN: [id number] Date of Birth: 03-14-1951 Referring Provider:   Flowsheet Row CARDIAC REHAB PHASE II ORIENTATION from 07/14/2020 in [place]  Referring Provider Dr. Harrington Challenger      Encounter Date: 09/19/2020  Check In:  Session Check In - 09/19/20 1100      Check-In   Supervising physician immediately available to respond to emergencies [facility] MD immediately available    Physician(s) Dr. Harl Bowie    Location [facility]    Staff Present Cathren Harsh, MS, Exercise Physiologist;Debra Wynetta Emery, RN, BSN    Virtual Visit No    Medication changes reported     No    Fall or balance concerns reported    No    Tobacco Cessation No Change    Warm-up and Cool-down Performed as group-led instruction    Resistance Training Performed Yes    VAD Patient? No    PAD/SET Patient? No      Pain Assessment   Currently in Pain? No/denies    Multiple Pain Sites No           Capillary Blood Glucose: No results found for this or any previous visit (from the past 24 hour(s)).    Social History   Tobacco Use  Smoking Status Former Smoker  . Types: Cigarettes  . Start date: 10/25/1966  . Quit date: 10/25/1998  . Years since quitting: 21.9  Smokeless Tobacco Never Used    Goals Met:  Independence with exercise equipment Exercise tolerated well No report of cardiac concerns or symptoms Strength training completed today  Goals Unmet:  Not Applicable  Comments: check out 1200   Dr. Kathie Dike is Medical Director for [facility] Pulmonary Rehab.

## 2020-09-21 ENCOUNTER — Encounter (HOSPITAL_COMMUNITY): Payer: Medicare Other

## 2020-09-21 NOTE — Progress Notes (Signed)
Cardiac Individual Treatment Plan  Patient Details  Name: Jeremy Lopez MRN: [id number] Date of Birth: [birth year] Referring Provider:   Flowsheet Row CARDIAC REHAB PHASE II ORIENTATION from 07/14/2020 in [place]  Referring Provider Dr. Harrington Challenger      Initial Encounter Date:  Flowsheet Row CARDIAC REHAB PHASE II ORIENTATION from 07/14/2020 in [place]  Date 07/14/20      Visit Diagnosis: Status post angioplasty with stent  Patient's Home Medications on Admission:  Current Outpatient Medications:  .  albuterol (VENTOLIN HFA) 108 (90 Base) MCG/ACT inhaler, Inhale 2 puffs into the lungs every 6 (six) hours as needed for wheezing or shortness of breath., Disp: , Rfl:  .  amLODipine (NORVASC) 2.5 MG tablet, Take 1 tablet (2.5 mg total) by mouth daily., Disp: 30 tablet, Rfl: 6 .  aspirin 81 MG tablet, Take 81 mg by mouth daily. , Disp: , Rfl:  .  CARTIA XT 240 MG 24 hr capsule, TAKE ONE CAPSULE (240 MG TOTAL) BY MOUTHDAILY., Disp: 90 capsule, Rfl: 3 .  Cholecalciferol (VITAMIN D) 50 MCG (2000 UT) tablet, Take 2,000 Units by mouth daily., Disp: , Rfl:  .  clopidogrel (PLAVIX) 75 MG tablet, Take 1 tablet (75 mg total) by mouth daily with breakfast., Disp: 30 tablet, Rfl: 11 .  fluticasone (FLONASE) 50 MCG/ACT nasal spray, Place 2 sprays into both nostrils daily as needed for allergies or rhinitis., Disp: , Rfl:  .  metoprolol succinate (TOPROL-XL) 50 MG 24 hr tablet, TAKE ONE TABLET (50 MG TOTAL) BY MOUTH DAILY., Disp: 90 tablet, Rfl: 3 .  nitroGLYCERIN (NITROSTAT) 0.4 MG SL tablet, Place 1 tablet (0.4 mg total) under the tongue every 5 (five) minutes as needed for chest pain., Disp: 25 tablet, Rfl: 2 .  rosuvastatin (CRESTOR) 40 MG tablet, TAKE ONE (1) TABLET BY MOUTH EVERY DAY, Disp: 90 tablet, Rfl: 3 No current facility-administered medications for this encounter.  Facility-Administered Medications Ordered in Other Encounters:  .  regadenoson  (LEXISCAN) injection SOLN 0.4 mg, 0.4 mg, Intravenous, Once, O'Neal, Cassie Freer, MD  Past Medical History: Past Medical History:  Diagnosis Date  . CAD (coronary artery disease), native coronary artery    DES proximal LAD 07/2009; scattered 20-40% lesions; normal LVEF  . Essential hypertension   . Hyperlipidemia   . Small cell lung cancer ([place])    Right upper lobe 2008; no evidence of disease since treatment ended in 11/2007    Tobacco Use: Social History   Tobacco Use  Smoking Status Former Smoker  . Types: Cigarettes  . Start date: 10/25/1966  . Quit date: 10/25/1998  . Years since quitting: 21.9  Smokeless Tobacco Never Used    Labs: Recent Review Flowsheet Data    Labs for ITP Cardiac and Pulmonary Rehab Latest Ref Rng & Units 01/04/2016 12/27/2017 12/28/2019 04/26/2020 04/26/2020   Cholestrol <200 mg/dL 120 94 128 - -   LDLCALC mg/dL (calc) 51 42 61 - -   HDL > OR = 40 mg/dL 47 39(L) 42 - -   Trlycerides <150 mg/dL 109 64 175(H) - -   Hemoglobin A1c <5.7 % - - - - -   PHART 7.350 - 7.450 - - - - 7.386   PCO2ART 32.0 - 48.0 mmHg - - - - 45.1   HCO3 20.0 - 28.0 mmol/L - - - 25.1 27.0   TCO2 22 - 32 mmol/L - - - 26 28   ACIDBASEDEF 0.0 - 2.0 mmol/L - - - - -  O2SAT % - - - 94.0 72.0      Capillary Blood Glucose: No results found for: GLUCAP   Exercise Target Goals: Exercise Program Goal: Individual exercise prescription set using results from initial 6 min walk test and THRR while considering  patient's activity barriers and safety.   Exercise Prescription Goal: Starting with aerobic activity 30 plus minutes a day, 3 days per week for initial exercise prescription. Provide home exercise prescription and guidelines that participant acknowledges understanding prior to discharge.  Activity Barriers & Risk Stratification:  Activity Barriers & Cardiac Risk Stratification - 09/20/20 1212      Activity Barriers & Cardiac Risk Stratification   Activity Barriers None     Cardiac Risk Stratification High           6 Minute Walk:  6 Minute Walk    Row Name 07/14/20 1501         6 Minute Walk   Phase Initial     Distance 1200 feet     Walk Time 6 minutes     # of Rest Breaks 0     MPH 2.27     METS 2.51     RPE 11     VO2 Peak 8.78     Symptoms No     Resting HR 69 bpm     Resting BP 118/60     Resting Oxygen Saturation  96 %     Exercise Oxygen Saturation  during 6 min walk 96 %     Max Ex. HR 86 bpm     Max Ex. BP 146/76     2 Minute Post BP 130/76            Oxygen Initial Assessment:   Oxygen Re-Evaluation:   Oxygen Discharge (Final Oxygen Re-Evaluation):   Initial Exercise Prescription:  Initial Exercise Prescription - 07/14/20 1500      Date of Initial Exercise RX and Referring Provider   Date 07/14/20    Referring Provider Dr. Harrington Challenger    Expected Discharge Date 10/07/20      Treadmill   MPH 1.5    Grade 0    Minutes 17    METs 2.15      NuStep   Level 2    SPM 80    Minutes 22      Prescription Details   Frequency (times per week) 2    Duration Progress to 30 minutes of continuous aerobic without signs/symptoms of physical distress      Intensity   THRR 40-80% of Max Heartrate 60-121    Ratings of Perceived Exertion 11-13    Perceived Dyspnea 0-4      Resistance Training   Training Prescription Yes    Weight 3 lbs    Reps 10-15           Perform Capillary Blood Glucose checks as needed.  Exercise Prescription Changes:  Exercise Prescription Changes    Row Name 07/25/20 1400 08/01/20 1500 08/15/20 1500 08/17/20 1120 08/29/20 1100     Response to Exercise   Blood Pressure (Admit) 140/76 116/80 106/64 138/72 --   Blood Pressure (Exercise) 138/74 126/60 130/68 140/64 --   Blood Pressure (Exit) 116/70 126/80 108/62 122/68 --   Heart Rate (Admit) 84 bpm 78 bpm 78 bpm 73 bpm --   Heart Rate (Exercise) 115 bpm 98 bpm 101 bpm 104 bpm --   Heart Rate (Exit) 75 bpm 85 bpm 86 bpm 82 bpm --   Rating  of  Perceived Exertion (Exercise) 12 11 11 11  --   Duration Continue with 30 min of aerobic exercise without signs/symptoms of physical distress. Continue with 30 min of aerobic exercise without signs/symptoms of physical distress. Continue with 30 min of aerobic exercise without signs/symptoms of physical distress. Continue with 30 min of aerobic exercise without signs/symptoms of physical distress. --   Intensity THRR unchanged THRR unchanged THRR unchanged THRR unchanged --     Progression   Progression Continue to progress workloads to maintain intensity without signs/symptoms of physical distress. Continue to progress workloads to maintain intensity without signs/symptoms of physical distress. Continue to progress workloads to maintain intensity without signs/symptoms of physical distress. Continue to progress workloads to maintain intensity without signs/symptoms of physical distress. --     Horticulturist, commercial Prescription Yes Yes Yes Yes --   Weight 3 lbs 5 lbs 5 lbs 5 lbs --   Reps 10-15 10-15 10-15 10-15 --   Time 10 Minutes 10 Minutes 10 Minutes 10 Minutes --     Treadmill   MPH 1.7 1.9 2 2.1 --   Grade 0 0 1 0 --   Minutes 17 17 17 17  --   METs 2.3 2.45 2.81 2.61 --     NuStep   Level 2 2 4 4  --   SPM 98 99 83 94 --   Minutes 22 22 22 22  --   METs 1.9 1.9 1.8 1.9 --     Home Exercise Plan   Plans to continue exercise at -- -- -- -- Home (comment)   Frequency -- -- -- -- Add 3 additional days to program exercise sessions.   Initial Home Exercises Provided -- -- -- -- 08/29/20   Row Name 08/29/20 1118 09/19/20 1200           Response to Exercise   Blood Pressure (Admit) 140/60 122/64      Blood Pressure (Exercise) 160/84 128/68      Blood Pressure (Exit) 110/62 114/62      Heart Rate (Admit) 79 bpm 59 bpm      Heart Rate (Exercise) 105 bpm 96 bpm      Heart Rate (Exit) 86 bpm 81 bpm      Rating of Perceived Exertion (Exercise) 11 12      Duration Continue  with 30 min of aerobic exercise without signs/symptoms of physical distress. Continue with 30 min of aerobic exercise without signs/symptoms of physical distress.      Intensity THRR unchanged THRR unchanged             Progression   Progression Continue to progress workloads to maintain intensity without signs/symptoms of physical distress. Continue to progress workloads to maintain intensity without signs/symptoms of physical distress.             Resistance Training   Training Prescription Yes Yes      Weight 5 lbs 8 lbs      Reps 10-15 10-15      Time 10 Minutes 10 Minutes             Treadmill   MPH 2 2      Grade 1 1      Minutes 17 17      METs 2.81 2.81             NuStep   Level 3 4      SPM 88 79      Minutes 22 22  METs 1.8 1.8             Exercise Comments:  Exercise Comments    Row Name 07/18/20 1223 08/29/20 1131         Exercise Comments Pt completed his first day of exercise in cardiac rehab today. He was able to tolerate exercise well without any complaints. Reviewed home exercise program with patient. He walks 2 miles every day he is not in rehab. Going to get some light weights to do resistance training at home as well.             Exercise Goals and Review:  Exercise Goals    Row Name 07/14/20 1505 08/01/20 1515 08/22/20 1509 09/19/20 1300       Exercise Goals   Increase Physical Activity Yes Yes Yes Yes    Intervention Provide advice, education, support and counseling about physical activity/exercise needs.;Develop an individualized exercise prescription for aerobic and resistive training based on initial evaluation findings, risk stratification, comorbidities and participant's personal goals. Provide advice, education, support and counseling about physical activity/exercise needs.;Develop an individualized exercise prescription for aerobic and resistive training based on initial evaluation findings, risk stratification, comorbidities and  participant's personal goals. Provide advice, education, support and counseling about physical activity/exercise needs.;Develop an individualized exercise prescription for aerobic and resistive training based on initial evaluation findings, risk stratification, comorbidities and participant's personal goals. Provide advice, education, support and counseling about physical activity/exercise needs.;Develop an individualized exercise prescription for aerobic and resistive training based on initial evaluation findings, risk stratification, comorbidities and participant's personal goals.    Expected Outcomes Short Term: Attend rehab on a regular basis to increase amount of physical activity.;Long Term: Exercising regularly at least 3-5 days a week.;Long Term: Add in home exercise to make exercise part of routine and to increase amount of physical activity. Short Term: Attend rehab on a regular basis to increase amount of physical activity.;Long Term: Exercising regularly at least 3-5 days a week.;Long Term: Add in home exercise to make exercise part of routine and to increase amount of physical activity. Short Term: Attend rehab on a regular basis to increase amount of physical activity.;Long Term: Add in home exercise to make exercise part of routine and to increase amount of physical activity.;Long Term: Exercising regularly at least 3-5 days a week. Short Term: Attend rehab on a regular basis to increase amount of physical activity.;Long Term: Add in home exercise to make exercise part of routine and to increase amount of physical activity.;Long Term: Exercising regularly at least 3-5 days a week.    Increase Strength and Stamina Yes Yes Yes Yes    Intervention Provide advice, education, support and counseling about physical activity/exercise needs.;Develop an individualized exercise prescription for aerobic and resistive training based on initial evaluation findings, risk stratification, comorbidities and  participant's personal goals. Provide advice, education, support and counseling about physical activity/exercise needs.;Develop an individualized exercise prescription for aerobic and resistive training based on initial evaluation findings, risk stratification, comorbidities and participant's personal goals. Provide advice, education, support and counseling about physical activity/exercise needs.;Develop an individualized exercise prescription for aerobic and resistive training based on initial evaluation findings, risk stratification, comorbidities and participant's personal goals. Provide advice, education, support and counseling about physical activity/exercise needs.;Develop an individualized exercise prescription for aerobic and resistive training based on initial evaluation findings, risk stratification, comorbidities and participant's personal goals.    Expected Outcomes Short Term: Increase workloads from initial exercise prescription for resistance, speed, and METs.;Short Term: Perform resistance training  exercises routinely during rehab and add in resistance training at home;Long Term: Improve cardiorespiratory fitness, muscular endurance and strength as measured by increased METs and functional capacity (6MWT) Short Term: Increase workloads from initial exercise prescription for resistance, speed, and METs.;Short Term: Perform resistance training exercises routinely during rehab and add in resistance training at home;Long Term: Improve cardiorespiratory fitness, muscular endurance and strength as measured by increased METs and functional capacity (6MWT) Short Term: Increase workloads from initial exercise prescription for resistance, speed, and METs.;Short Term: Perform resistance training exercises routinely during rehab and add in resistance training at home;Long Term: Improve cardiorespiratory fitness, muscular endurance and strength as measured by increased METs and functional capacity (6MWT) Short  Term: Increase workloads from initial exercise prescription for resistance, speed, and METs.;Short Term: Perform resistance training exercises routinely during rehab and add in resistance training at home;Long Term: Improve cardiorespiratory fitness, muscular endurance and strength as measured by increased METs and functional capacity (6MWT)    Able to understand and use rate of perceived exertion (RPE) scale Yes Yes Yes Yes    Intervention Provide education and explanation on how to use RPE scale Provide education and explanation on how to use RPE scale Provide education and explanation on how to use RPE scale Provide education and explanation on how to use RPE scale    Expected Outcomes Short Term: Able to use RPE daily in rehab to express subjective intensity level;Long Term:  Able to use RPE to guide intensity level when exercising independently Short Term: Able to use RPE daily in rehab to express subjective intensity level;Long Term:  Able to use RPE to guide intensity level when exercising independently Short Term: Able to use RPE daily in rehab to express subjective intensity level;Long Term:  Able to use RPE to guide intensity level when exercising independently Short Term: Able to use RPE daily in rehab to express subjective intensity level;Long Term:  Able to use RPE to guide intensity level when exercising independently    Knowledge and understanding of Target Heart Rate Range (THRR) Yes Yes Yes Yes    Intervention Provide education and explanation of THRR including how the numbers were predicted and where they are located for reference Provide education and explanation of THRR including how the numbers were predicted and where they are located for reference Provide education and explanation of THRR including how the numbers were predicted and where they are located for reference Provide education and explanation of THRR including how the numbers were predicted and where they are located for  reference    Expected Outcomes Short Term: Able to state/look up THRR;Long Term: Able to use THRR to govern intensity when exercising independently;Short Term: Able to use daily as guideline for intensity in rehab Short Term: Able to state/look up THRR;Long Term: Able to use THRR to govern intensity when exercising independently;Short Term: Able to use daily as guideline for intensity in rehab Short Term: Able to state/look up THRR;Long Term: Able to use THRR to govern intensity when exercising independently;Short Term: Able to use daily as guideline for intensity in rehab Short Term: Able to state/look up THRR;Short Term: Able to use daily as guideline for intensity in rehab;Long Term: Able to use THRR to govern intensity when exercising independently    Able to check pulse independently Yes Yes Yes Yes    Intervention Provide education and demonstration on how to check pulse in carotid and radial arteries.;Review the importance of being able to check your own pulse for safety during  independent exercise Provide education and demonstration on how to check pulse in carotid and radial arteries.;Review the importance of being able to check your own pulse for safety during independent exercise Provide education and demonstration on how to check pulse in carotid and radial arteries.;Review the importance of being able to check your own pulse for safety during independent exercise Provide education and demonstration on how to check pulse in carotid and radial arteries.;Review the importance of being able to check your own pulse for safety during independent exercise    Expected Outcomes Short Term: Able to explain why pulse checking is important during independent exercise;Long Term: Able to check pulse independently and accurately Short Term: Able to explain why pulse checking is important during independent exercise;Long Term: Able to check pulse independently and accurately Short Term: Able to explain why pulse  checking is important during independent exercise;Long Term: Able to check pulse independently and accurately Short Term: Able to explain why pulse checking is important during independent exercise;Long Term: Able to check pulse independently and accurately    Understanding of Exercise Prescription Yes Yes Yes Yes    Intervention Provide education, explanation, and written materials on patient's individual exercise prescription Provide education, explanation, and written materials on patient's individual exercise prescription Provide education, explanation, and written materials on patient's individual exercise prescription Provide education, explanation, and written materials on patient's individual exercise prescription    Expected Outcomes Short Term: Able to explain program exercise prescription;Long Term: Able to explain home exercise prescription to exercise independently Short Term: Able to explain program exercise prescription;Long Term: Able to explain home exercise prescription to exercise independently Short Term: Able to explain program exercise prescription;Long Term: Able to explain home exercise prescription to exercise independently Short Term: Able to explain program exercise prescription;Long Term: Able to explain home exercise prescription to exercise independently           Exercise Goals Re-Evaluation :  Exercise Goals Re-Evaluation    Row Name 08/01/20 1515 08/22/20 1510 09/19/20 1349         Exercise Goal Re-Evaluation   Exercise Goals Review Increase Strength and Stamina;Able to understand and use rate of perceived exertion (RPE) scale;Knowledge and understanding of Target Heart Rate Range (THRR);Able to check pulse independently;Understanding of Exercise Prescription Increase Physical Activity;Increase Strength and Stamina;Able to understand and use rate of perceived exertion (RPE) scale;Knowledge and understanding of Target Heart Rate Range (THRR);Able to check pulse  independently;Understanding of Exercise Prescription Increase Physical Activity;Increase Strength and Stamina;Able to understand and use rate of perceived exertion (RPE) scale;Knowledge and understanding of Target Heart Rate Range (THRR);Able to check pulse independently;Understanding of Exercise Prescription     Comments Pt has attended 6 exercise sessions. He is unmotivated and does not push himself. His walking on the treadmill has improved. He currently exercises at 1.9 METs on the stepper, but could likely do much more if he tried. Will continue to monitor and progress as able. Pt has completed 13 exercise sessions. He has been consistently doing the same intensity on the NuStep and has improved a little on the treadmill. He is currently exercising at 1.9 METs on the NuStep. Will continue to monitor and progress exercise as able. Patient has completed 22 exercise sessions. He is tolerating exercise well and progressing well. He is exercising at 1.8 METs on the NuStep. Will continue to monitor and progress exercise as able.     Expected Outcomes Through exercise at rehab and by engaging in a home exercise plan, the patient  will be able to reach their goals. Through exercise at rehab and by engaging in a home exercise program the patient will reach their goals. Through exercising at rehab and a home exercise plan, patient will achieve his goals.             Discharge Exercise Prescription (Final Exercise Prescription Changes):  Exercise Prescription Changes - 09/19/20 1200      Response to Exercise   Blood Pressure (Admit) 122/64    Blood Pressure (Exercise) 128/68    Blood Pressure (Exit) 114/62    Heart Rate (Admit) 59 bpm    Heart Rate (Exercise) 96 bpm    Heart Rate (Exit) 81 bpm    Rating of Perceived Exertion (Exercise) 12    Duration Continue with 30 min of aerobic exercise without signs/symptoms of physical distress.    Intensity THRR unchanged      Progression   Progression  Continue to progress workloads to maintain intensity without signs/symptoms of physical distress.      Resistance Training   Training Prescription Yes    Weight 8 lbs    Reps 10-15    Time 10 Minutes      Treadmill   MPH 2    Grade 1    Minutes 17    METs 2.81      NuStep   Level 4    SPM 79    Minutes 22    METs 1.8           Nutrition:  Target Goals: Understanding of nutrition guidelines, daily intake of sodium 1500mg , cholesterol 200mg , calories 30% from fat and 7% or less from saturated fats, daily to have 5 or more servings of fruits and vegetables.  Biometrics:  Pre Biometrics - 09/19/20 1353      Pre Biometrics   Weight 101.4 kg            Nutrition Therapy Plan and Nutrition Goals:  Nutrition Therapy & Goals - 09/16/20 0904      Personal Nutrition Goals   Comments We continue to provide education through hand-outs regarding nutrition and making healthier choices.      Intervention Plan   Intervention Nutrition handout(s) given to patient.           Nutrition Assessments:  Nutrition Assessments - 07/14/20 1501      MEDFICTS Scores   Pre Score 19          MEDIFICTS Score Key:  ?70 Need to make dietary changes   40-70 Heart Healthy Diet  ? 40 Therapeutic Level Cholesterol Diet   Picture Your Plate Scores:  <53 Unhealthy dietary pattern with much room for improvement.  41-50 Dietary pattern unlikely to meet recommendations for good health and room for improvement.  51-60 More healthful dietary pattern, with some room for improvement.   >60 Healthy dietary pattern, although there may be some specific behaviors that could be improved.    Nutrition Goals Re-Evaluation:   Nutrition Goals Discharge (Final Nutrition Goals Re-Evaluation):   Psychosocial: Target Goals: Acknowledge presence or absence of significant depression and/or stress, maximize coping skills, provide positive support system. Participant is able to verbalize  types and ability to use techniques and skills needed for reducing stress and depression.  Initial Review & Psychosocial Screening:  Initial Psych Review & Screening - 07/14/20 1503      Initial Review   Current issues with None Identified      Family Dynamics   Good Support System? Yes  Comments Patient lives with his wife of many years. He denies any depression or anxiety. He has children and is getting ready to enjoy a new grandchild. He has a positive outlook regarding his future. Will continue to monitor.      Barriers   Psychosocial barriers to participate in program There are no identifiable barriers or psychosocial needs.;The patient should benefit from training in stress management and relaxation.      Screening Interventions   Interventions Encouraged to exercise           Quality of Life Scores:  Quality of Life - 07/14/20 1501      Quality of Life   Select Quality of Life      Quality of Life Scores   Health/Function Pre 24.4 %    Socioeconomic Pre 28.5 %    Psych/Spiritual Pre 24 %    Family Pre 28.8 %    GLOBAL Pre 25.89 %          Scores of 19 and below usually indicate a poorer quality of life in these areas.  A difference of  2-3 points is a clinically meaningful difference.  A difference of 2-3 points in the total score of the Quality of Life Index has been associated with significant improvement in overall quality of life, self-image, physical symptoms, and general health in studies assessing change in quality of life.  PHQ-9: Recent Review Flowsheet Data    Depression screen [facility] 2/9 07/14/2020   Decreased Interest 0   Down, Depressed, Hopeless 0   PHQ - 2 Score 0   Altered sleeping 0   Tired, decreased energy 1   Change in appetite 0   Feeling bad or failure about yourself  0   Trouble concentrating 0   Moving slowly or fidgety/restless 0   Suicidal thoughts 0   PHQ-9 Score 1   Difficult doing work/chores Not difficult at all      Interpretation of Total Score  Total Score Depression Severity:  1-4 = Minimal depression, 5-9 = Mild depression, 10-14 = Moderate depression, 15-19 = Moderately severe depression, 20-27 = Severe depression   Psychosocial Evaluation and Intervention:  Psychosocial Evaluation - 07/14/20 1504      Psychosocial Evaluation & Interventions   Interventions Encouraged to exercise with the program and follow exercise prescription;Stress management education;Relaxation education    Comments Patient's initial QOL score was 25.89 overall and his PHQ-9 score was 1 with no psychosocial issues identified at his orientation visit.    Expected Outcomes Patient will have no psychosocial issues identified at discharge.    Continue Psychosocial Services  No Follow up required           Psychosocial Re-Evaluation:  Psychosocial Re-Evaluation    [place] Name 08/01/20 1257 08/19/20 1503 09/16/20 0905         Psychosocial Re-Evaluation   Current issues with None Identified None Identified None Identified     Comments Patient continues to have no psychosocial issues identified. Will continue to monitor. Patient continues to have no psychosocial issues identified. Patient works hard during his sessions and seems to enjoy coming. He is very interactive with other patient in his class and with staff. Will continue to monitor. Patient continues to have no psychosocial issues identified. Patient works hard during his sessions and seems to enjoy coming and demonstrates a positive outlook regarding his future. He is very interactive with other patient in his class and with staff. Will continue to monitor.  Expected Outcomes Patient will have no psychosocial issues identified at discharge. Patient will have no psychosocial issues identified at discharge. Patient will have no psychosocial issues identified at discharge.     Interventions Relaxation education;Encouraged to attend Cardiac Rehabilitation for the  exercise;Stress management education Relaxation education;Encouraged to attend Cardiac Rehabilitation for the exercise;Stress management education Relaxation education;Encouraged to attend Cardiac Rehabilitation for the exercise;Stress management education     Continue Psychosocial Services  No Follow up required No Follow up required No Follow up required            Psychosocial Discharge (Final Psychosocial Re-Evaluation):  Psychosocial Re-Evaluation - 09/16/20 0905      Psychosocial Re-Evaluation   Current issues with None Identified    Comments Patient continues to have no psychosocial issues identified. Patient works hard during his sessions and seems to enjoy coming and demonstrates a positive outlook regarding his future. He is very interactive with other patient in his class and with staff. Will continue to monitor.    Expected Outcomes Patient will have no psychosocial issues identified at discharge.    Interventions Relaxation education;Encouraged to attend Cardiac Rehabilitation for the exercise;Stress management education    Continue Psychosocial Services  No Follow up required           Vocational Rehabilitation: Provide vocational rehab assistance to qualifying candidates.   Vocational Rehab Evaluation & Intervention:  Vocational Rehab - 07/14/20 1501      Initial Vocational Rehab Evaluation & Intervention   Assessment shows need for Vocational Rehabilitation No      Vocational Rehab Re-Evaulation   Comments Patient is a retired Emergency planning/management officer. He is not interested in returning to work and does not need vocational rehab.           Education: Education Goals: Education classes will be provided on a weekly basis, covering required topics. Participant will state understanding/return demonstration of topics presented.  Learning Barriers/Preferences:  Learning Barriers/Preferences - 07/14/20 1501      Learning Barriers/Preferences   Learning Barriers None     Learning Preferences Written Material;Video;Audio;Skilled Demonstration           Education Topics: Hypertension, Hypertension Reduction -Define heart disease and high blood pressure. Discus how high blood pressure affects the body and ways to reduce high blood pressure.   Exercise and Your Heart -Discuss why it is important to exercise, the FITT principles of exercise, normal and abnormal responses to exercise, and how to exercise safely. Flowsheet Row CARDIAC REHAB PHASE II EXERCISE from 09/14/2020 in [place]  Date 07/20/20  Educator DF  Instruction Review Code 2- Demonstrated Understanding      Angina -Discuss definition of angina, causes of angina, treatment of angina, and how to decrease risk of having angina. Flowsheet Row CARDIAC REHAB PHASE II EXERCISE from 09/14/2020 in [place]  Date 07/29/20  Educator DF  Instruction Review Code 2- Demonstrated Understanding      Cardiac Medications -Review what the following cardiac medications are used for, how they affect the body, and side effects that may occur when taking the medications.  Medications include Aspirin, Beta blockers, calcium channel blockers, ACE Inhibitors, angiotensin receptor blockers, diuretics, digoxin, and antihyperlipidemics. Flowsheet Row CARDIAC REHAB PHASE II EXERCISE from 09/14/2020 in [place]  Date 08/03/20  Educator DF  Instruction Review Code 2- Demonstrated Understanding      Congestive Heart Failure -Discuss the definition of CHF, how to live with CHF, the signs and symptoms  of CHF, and how keep track of weight and sodium intake. Flowsheet Row CARDIAC REHAB PHASE II EXERCISE from 09/14/2020 in [place]  Date 08/10/20  Educator DF  Instruction Review Code 2- Demonstrated Understanding      Heart Disease and Intimacy -Discus the effect sexual activity has on the heart, how changes occur during  intimacy as we age, and safety during sexual activity. Flowsheet Row CARDIAC REHAB PHASE II EXERCISE from 09/14/2020 in [place]  Date 08/17/20  Educator DF  Instruction Review Code 2- Demonstrated Understanding      Smoking Cessation / COPD -Discuss different methods to quit smoking, the health benefits of quitting smoking, and the definition of COPD. Flowsheet Row CARDIAC REHAB PHASE II EXERCISE from 09/14/2020 in [place]  Date 08/24/20  Educator DF  Instruction Review Code 2- Demonstrated Understanding      Nutrition I: Fats -Discuss the types of cholesterol, what cholesterol does to the heart, and how cholesterol levels can be controlled. Flowsheet Row CARDIAC REHAB PHASE II EXERCISE from 09/14/2020 in [place]  Date 08/31/20  Educator [facility]  Instruction Review Code 2- Demonstrated Understanding      Nutrition II: Labels -Discuss the different components of food labels and how to read food label [place] from 09/14/2020 in [place]  Date 09/07/20  Educator DF  Instruction Review Code 2- Demonstrated Understanding      Heart Parts/Heart Disease and PAD -Discuss the anatomy of the heart, the pathway of blood circulation through the heart, and these are affected by heart disease.   Stress I: Signs and Symptoms -Discuss the causes of stress, how stress may lead to anxiety and depression, and ways to limit stress.   Stress II: Relaxation -Discuss different types of relaxation techniques to limit stress.   Warning Signs of Stroke / TIA -Discuss definition of a stroke, what the signs and symptoms are of a stroke, and how to identify when someone is having stroke.   Knowledge Questionnaire Score:  Knowledge Questionnaire Score - 07/14/20 1501      Knowledge Questionnaire Score   Pre Score 23/24           Core Components/Risk  Factors/Patient Goals at Admission:  Personal Goals and Risk Factors at Admission - 07/14/20 1502      Core Components/Risk Factors/Patient Goals on Admission    Weight Management Obesity    Personal Goal Other Yes    Personal Goal Increase strength and stamina. Increase strength in his legs especially. Maintain his health.    Intervention Patient will attend CR 2 days/week and supplement with exercise at home 3 days/week.    Expected Outcomes Patient will meet both program and personal goals.           Core Components/Risk Factors/Patient Goals Review:   Goals and Risk Factor Review    Row Name 08/01/20 1258 08/19/20 1503 09/16/20 0905         Core Components/Risk Factors/Patient Goals Review   Personal Goals Review Weight Management/Obesity Weight Management/Obesity Weight Management/Obesity     Review Patient has completed 7 sessions losing 3 lbs since his initial visit. He has multiple risk factors for CAD and is participating in CR to modify risk factors. His personal goals are to increase strength and stamina; increase strength in his legs; and maintain his health. His attendance is consistent and he is doing well in the program with progression.  Will continue to monitor. Patient has completed 14 sessions maintaining his weight in last 30 day review. He continues to do well in the program with progressions and consistent attendance. His b/p is WNL. His personal goals are increase strength and stamin; increase strength in legs; and to maintain his health. His balance has improved and his gait on the treadmill has improved. He feels he is working toward meeting his goals. Will continue to monitor for progress. Patient has completed 22 sessions maintaining his weight since last 30 day review. He continues to do well in the program with progressions and consistent attendance. His gait and balance have improved and he is getting stronger and gaining stamina. His personal goals are to increase  strength and stamin and increase strength in his legs and maintain his health. His blood pressure continues to be WNL. Will continue to monitor for progress.     Expected Outcomes Patient will complete the program meeting both his personal and program goals. Patient will complete the program meeting both his personal and program goals. Patient will complete the program meeting both his personal and program goals.            Core Components/Risk Factors/Patient Goals at Discharge (Final Review):   Goals and Risk Factor Review - 09/16/20 0905      Core Components/Risk Factors/Patient Goals Review   Personal Goals Review Weight Management/Obesity    Review Patient has completed 22 sessions maintaining his weight since last 30 day review. He continues to do well in the program with progressions and consistent attendance. His gait and balance have improved and he is getting stronger and gaining stamina. His personal goals are to increase strength and stamin and increase strength in his legs and maintain his health. His blood pressure continues to be WNL. Will continue to monitor for progress.    Expected Outcomes Patient will complete the program meeting both his personal and program goals.           ITP Comments:   Comments: ITP REVIEW Pt is making expected progress toward Cardiac Rehab goals after completing 23 sessions. Recommend continued exercise, life style modification, education, and increased stamina and strength.

## 2020-09-23 ENCOUNTER — Encounter (HOSPITAL_COMMUNITY): Payer: Medicare Other

## 2020-09-26 ENCOUNTER — Encounter (HOSPITAL_COMMUNITY): Payer: Medicare Other

## 2020-09-28 ENCOUNTER — Encounter (HOSPITAL_COMMUNITY): Payer: Medicare Other

## 2020-09-30 ENCOUNTER — Encounter (HOSPITAL_COMMUNITY): Payer: Medicare Other

## 2020-10-03 ENCOUNTER — Encounter (HOSPITAL_COMMUNITY)
Admission: RE | Admit: 2020-10-03 | Discharge: 2020-10-03 | Disposition: A | Discharge status: Home / Self Care | Payer: Medicare Other | Source: Ambulatory Visit | Attending: Internal Medicine | Admitting: Internal Medicine

## 2020-10-03 ENCOUNTER — Other Ambulatory Visit: Payer: Self-pay

## 2020-10-03 VITALS — Wt 221.3 lb

## 2020-10-03 DIAGNOSIS — Z95820 Peripheral vascular angioplasty status with implants and grafts: Secondary | ICD-10-CM

## 2020-10-03 DIAGNOSIS — Z87891 Personal history of nicotine dependence: Secondary | ICD-10-CM | POA: Diagnosis not present

## 2020-10-03 DIAGNOSIS — I2510 Atherosclerotic heart disease of native coronary artery without angina pectoris: Secondary | ICD-10-CM | POA: Diagnosis not present

## 2020-10-03 DIAGNOSIS — I10 Essential (primary) hypertension: Secondary | ICD-10-CM | POA: Diagnosis not present

## 2020-10-03 DIAGNOSIS — Z79899 Other long term (current) drug therapy: Secondary | ICD-10-CM | POA: Diagnosis not present

## 2020-10-03 NOTE — Progress Notes (Signed)
Daily Session Note  Patient Details  Name: Jeremy Lopez MRN: [id number] Date of Birth: [birth year] Referring Provider:   Flowsheet Row CARDIAC REHAB PHASE II ORIENTATION from 07/14/2020 in [place]  Referring Provider Dr. Harrington Challenger      Encounter Date: 10/03/2020  Check In:  Session Check In - 10/03/20 1100      Check-In   Supervising physician immediately available to respond to emergencies [facility] MD immediately available    Physician(s) Harrington Challenger    Staff Present Geanie Cooley, RN;Dalton Fletcher, MS, ACSM-CEP, Exercise Physiologist    Virtual Visit No    Medication changes reported     No    Fall or balance concerns reported    No    Tobacco Cessation No Change    Warm-up and Cool-down Performed as group-led instruction    Resistance Training Performed Yes    VAD Patient? No    PAD/SET Patient? No      Pain Assessment   Currently in Pain? No/denies    Multiple Pain Sites No           Capillary Blood Glucose: No results found for this or any previous visit (from the past 24 hour(s)).    Social History   Tobacco Use  Smoking Status Former Smoker  . Types: Cigarettes  . Start date: 10/25/1966  . Quit date: 10/25/1998  . Years since quitting: 21.9  Smokeless Tobacco Never Used    Goals Met:  Independence with exercise equipment Exercise tolerated well No report of cardiac concerns or symptoms Strength training completed today  Goals Unmet:  Not Applicable  Comments: check out @ 12:00   Dr. Kathie Dike is Medical Director for [facility] Pulmonary Rehab.

## 2020-10-05 ENCOUNTER — Other Ambulatory Visit: Payer: Self-pay

## 2020-10-05 ENCOUNTER — Encounter (HOSPITAL_COMMUNITY)
Admission: RE | Admit: 2020-10-05 | Discharge: 2020-10-05 | Disposition: A | Discharge status: Home / Self Care | Payer: Medicare Other | Source: Ambulatory Visit | Attending: Internal Medicine | Admitting: Internal Medicine

## 2020-10-05 DIAGNOSIS — Z87891 Personal history of nicotine dependence: Secondary | ICD-10-CM | POA: Diagnosis not present

## 2020-10-05 DIAGNOSIS — Z79899 Other long term (current) drug therapy: Secondary | ICD-10-CM | POA: Diagnosis not present

## 2020-10-05 DIAGNOSIS — Z95820 Peripheral vascular angioplasty status with implants and grafts: Secondary | ICD-10-CM

## 2020-10-05 DIAGNOSIS — I10 Essential (primary) hypertension: Secondary | ICD-10-CM | POA: Diagnosis not present

## 2020-10-05 DIAGNOSIS — I2510 Atherosclerotic heart disease of native coronary artery without angina pectoris: Secondary | ICD-10-CM | POA: Diagnosis not present

## 2020-10-05 NOTE — Progress Notes (Signed)
Daily Session Note  Patient Details  Name: Jeremy Lopez MRN: [id number] Date of Birth: 07/19/1951 Referring Provider:   Flowsheet Row CARDIAC REHAB PHASE II ORIENTATION from 07/14/2020 in [place]  Referring Provider Dr. Harrington Challenger      Encounter Date: 10/05/2020  Check In:  Session Check In - 10/05/20 1100      Check-In   Supervising physician immediately available to respond to emergencies [facility] MD immediately available    Physician(s) Harrington Challenger    Location [facility]    Staff Present Geanie Cooley, RN;Ezell Melikian Kris Mouton, MS, ACSM-CEP, Exercise Physiologist    Virtual Visit No    Fall or balance concerns reported    No    Tobacco Cessation No Change    Warm-up and Cool-down Performed as group-led instruction    Resistance Training Performed Yes    VAD Patient? No    PAD/SET Patient? No      Pain Assessment   Currently in Pain? No/denies    Multiple Pain Sites No           Capillary Blood Glucose: No results found for this or any previous visit (from the past 24 hour(s)).    Social History   Tobacco Use  Smoking Status Former Smoker  . Types: Cigarettes  . Start date: 10/25/1966  . Quit date: 10/25/1998  . Years since quitting: 21.9  Smokeless Tobacco Never Used    Goals Met:  Independence with exercise equipment Exercise tolerated well No report of cardiac concerns or symptoms Strength training completed today  Goals Unmet:  Not Applicable  Comments: checkout time is 1200   Dr. Kathie Dike is Medical Director for [facility] Pulmonary Rehab.

## 2020-10-07 ENCOUNTER — Encounter (HOSPITAL_COMMUNITY): Payer: Medicare Other

## 2020-10-17 ENCOUNTER — Telehealth: Payer: Self-pay | Admitting: Internal Medicine

## 2020-10-17 MED ORDER — AMLODIPINE BESYLATE 2.5 MG PO TABS: 2.5000 mg | ORAL_TABLET | Freq: Every day | ORAL | 3 refills | Status: DC

## 2020-10-17 MED ORDER — CLOPIDOGREL BISULFATE 75 MG PO TABS: 75.0000 mg | ORAL_TABLET | Freq: Every day | ORAL | 3 refills | Status: DC

## 2020-10-17 NOTE — Telephone Encounter (Signed)
Refilled for 90 day supply

## 2020-10-17 NOTE — Telephone Encounter (Signed)
Received call from [facility] requesting a 90 day supply on the following:   Plavix 75 mg Amlodipine 2.5 mg

## 2020-10-19 NOTE — Progress Notes (Signed)
Cardiac Individual Treatment Plan  Patient Details  Name: Jeremy Lopez MRN: [id number] Date of Birth: 12-Nov-1950 Referring Provider:   Flowsheet Row CARDIAC REHAB PHASE II ORIENTATION from 07/14/2020 in [facility]  Referring Provider Dr. Harrington Challenger      Initial Encounter Date:  Flowsheet Row CARDIAC REHAB PHASE II ORIENTATION from 07/14/2020 in [facility]  Date 07/14/20      Visit Diagnosis: Status post angioplasty with stent  Patient's Home Medications on Admission:  Current Outpatient Medications:  .  albuterol (VENTOLIN HFA) 108 (90 Base) MCG/ACT inhaler, Inhale 2 puffs into the lungs every 6 (six) hours as needed for wheezing or shortness of breath., Disp: , Rfl:  .  amLODipine (NORVASC) 2.5 MG tablet, Take 1 tablet (2.5 mg total) by mouth daily., Disp: 90 tablet, Rfl: 3 .  aspirin 81 MG tablet, Take 81 mg by mouth daily. , Disp: , Rfl:  .  CARTIA XT 240 MG 24 hr capsule, TAKE ONE CAPSULE (240 MG TOTAL) BY MOUTHDAILY., Disp: 90 capsule, Rfl: 3 .  Cholecalciferol (VITAMIN D) 50 MCG (2000 UT) tablet, Take 2,000 Units by mouth daily., Disp: , Rfl:  .  clopidogrel (PLAVIX) 75 MG tablet, Take 1 tablet (75 mg total) by mouth daily with breakfast., Disp: 90 tablet, Rfl: 3 .  fluticasone (FLONASE) 50 MCG/ACT nasal spray, Place 2 sprays into both nostrils daily as needed for allergies or rhinitis., Disp: , Rfl:  .  metoprolol succinate (TOPROL-XL) 50 MG 24 hr tablet, TAKE ONE TABLET (50 MG TOTAL) BY MOUTH DAILY., Disp: 90 tablet, Rfl: 3 .  nitroGLYCERIN (NITROSTAT) 0.4 MG SL tablet, Place 1 tablet (0.4 mg total) under the tongue every 5 (five) minutes as needed for chest pain., Disp: 25 tablet, Rfl: 2 .  rosuvastatin (CRESTOR) 40 MG tablet, TAKE ONE (1) TABLET BY MOUTH EVERY DAY, Disp: 90 tablet, Rfl: 3 No current facility-administered medications for this encounter.  Facility-Administered Medications Ordered in Other Encounters:  .  regadenoson  (LEXISCAN) injection SOLN 0.4 mg, 0.4 mg, Intravenous, Once, O'Neal, Cassie Freer, MD  Past Medical History: Past Medical History:  Diagnosis Date  . CAD (coronary artery disease), native coronary artery    DES proximal LAD 07/2009; scattered 20-40% lesions; normal LVEF  . Essential hypertension   . Hyperlipidemia   . Small cell lung cancer ([place])    Right upper lobe 2008; no evidence of disease since treatment ended in 11/2007    Tobacco Use: Social History   Tobacco Use  Smoking Status Former Smoker  . Types: Cigarettes  . Start date: 10/25/1966  . Quit date: 10/25/1998  . Years since quitting: 22.0  Smokeless Tobacco Never Used    Labs: Recent Review Flowsheet Data    Labs for ITP Cardiac and Pulmonary Rehab Latest Ref Rng & Units 01/04/2016 12/27/2017 12/28/2019 04/26/2020 04/26/2020   Cholestrol <200 mg/dL 120 94 128 - -   LDLCALC mg/dL (calc) 51 42 61 - -   HDL > OR = 40 mg/dL 47 39(L) 42 - -   Trlycerides <150 mg/dL 109 64 175(H) - -   Hemoglobin A1c <5.7 % - - - - -   PHART 7.350 - 7.450 - - - - 7.386   PCO2ART 32.0 - 48.0 mmHg - - - - 45.1   HCO3 20.0 - 28.0 mmol/L - - - 25.1 27.0   TCO2 22 - 32 mmol/L - - - 26 28   ACIDBASEDEF 0.0 - 2.0 mmol/L - - - - -  O2SAT % - - - 94.0 72.0      Capillary Blood Glucose: No results found for: GLUCAP   Exercise Target Goals: Exercise Program Goal: Individual exercise prescription set using results from initial 6 min walk test and THRR while considering  patient's activity barriers and safety.   Exercise Prescription Goal: Starting with aerobic activity 30 plus minutes a day, 3 days per week for initial exercise prescription. Provide home exercise prescription and guidelines that participant acknowledges understanding prior to discharge.  Activity Barriers & Risk Stratification:  Activity Barriers & Cardiac Risk Stratification - 09/20/20 1212      Activity Barriers & Cardiac Risk Stratification   Activity Barriers None     Cardiac Risk Stratification High           6 Minute Walk:  6 Minute Walk    Row Name 07/14/20 1501         6 Minute Walk   Phase Initial     Distance 1200 feet     Walk Time 6 minutes     # of Rest Breaks 0     MPH 2.27     METS 2.51     RPE 11     VO2 Peak 8.78     Symptoms No     Resting HR 69 bpm     Resting BP 118/60     Resting Oxygen Saturation  96 %     Exercise Oxygen Saturation  during 6 min walk 96 %     Max Ex. HR 86 bpm     Max Ex. BP 146/76     2 Minute Post BP 130/76            Oxygen Initial Assessment:   Oxygen Re-Evaluation:   Oxygen Discharge (Final Oxygen Re-Evaluation):   Initial Exercise Prescription:  Initial Exercise Prescription - 07/14/20 1500      Date of Initial Exercise RX and Referring Provider   Date 07/14/20    Referring Provider Dr. Harrington Challenger    Expected Discharge Date 10/07/20      Treadmill   MPH 1.5    Grade 0    Minutes 17    METs 2.15      NuStep   Level 2    SPM 80    Minutes 22      Prescription Details   Frequency (times per week) 2    Duration Progress to 30 minutes of continuous aerobic without signs/symptoms of physical distress      Intensity   THRR 40-80% of Max Heartrate 60-121    Ratings of Perceived Exertion 11-13    Perceived Dyspnea 0-4      Resistance Training   Training Prescription Yes    Weight 3 lbs    Reps 10-15           Perform Capillary Blood Glucose checks as needed.  Exercise Prescription Changes:   Exercise Prescription Changes    Row Name 07/25/20 1400 08/01/20 1500 08/15/20 1500 08/17/20 1120 08/29/20 1100     Response to Exercise   Blood Pressure (Admit) 140/76 116/80 106/64 138/72 --   Blood Pressure (Exercise) 138/74 126/60 130/68 140/64 --   Blood Pressure (Exit) 116/70 126/80 108/62 122/68 --   Heart Rate (Admit) 84 bpm 78 bpm 78 bpm 73 bpm --   Heart Rate (Exercise) 115 bpm 98 bpm 101 bpm 104 bpm --   Heart Rate (Exit) 75 bpm 85 bpm 86 bpm 82 bpm --  Rating  of Perceived Exertion (Exercise) 12 11 11 11  --   Duration Continue with 30 min of aerobic exercise without signs/symptoms of physical distress. Continue with 30 min of aerobic exercise without signs/symptoms of physical distress. Continue with 30 min of aerobic exercise without signs/symptoms of physical distress. Continue with 30 min of aerobic exercise without signs/symptoms of physical distress. --   Intensity THRR unchanged THRR unchanged THRR unchanged THRR unchanged --     Progression   Progression Continue to progress workloads to maintain intensity without signs/symptoms of physical distress. Continue to progress workloads to maintain intensity without signs/symptoms of physical distress. Continue to progress workloads to maintain intensity without signs/symptoms of physical distress. Continue to progress workloads to maintain intensity without signs/symptoms of physical distress. --     Horticulturist, commercial Prescription Yes Yes Yes Yes --   Weight 3 lbs 5 lbs 5 lbs 5 lbs --   Reps 10-15 10-15 10-15 10-15 --   Time 10 Minutes 10 Minutes 10 Minutes 10 Minutes --     Treadmill   MPH 1.7 1.9 2 2.1 --   Grade 0 0 1 0 --   Minutes 17 17 17 17  --   METs 2.3 2.45 2.81 2.61 --     NuStep   Level 2 2 4 4  --   SPM 98 99 83 94 --   Minutes 22 22 22 22  --   METs 1.9 1.9 1.8 1.9 --     Home Exercise Plan   Plans to continue exercise at -- -- -- -- Home (comment)   Frequency -- -- -- -- Add 3 additional days to program exercise sessions.   Initial Home Exercises Provided -- -- -- -- 08/29/20   Row Name 08/29/20 1118 09/19/20 1200 10/03/20 1100 10/05/20 1100       Response to Exercise   Blood Pressure (Admit) 140/60 122/64 128/66 130/76    Blood Pressure (Exercise) 160/84 128/68 122/70 140/74    Blood Pressure (Exit) 110/62 114/62 120/62 122/76    Heart Rate (Admit) 79 bpm 59 bpm 58 bpm 68 bpm    Heart Rate (Exercise) 105 bpm 96 bpm 91 bpm 98 bpm    Heart Rate (Exit) 86 bpm  81 bpm 69 bpm 77 bpm    Rating of Perceived Exertion (Exercise) 11 12 12 11     Duration Continue with 30 min of aerobic exercise without signs/symptoms of physical distress. Continue with 30 min of aerobic exercise without signs/symptoms of physical distress. Continue with 30 min of aerobic exercise without signs/symptoms of physical distress. Continue with 30 min of aerobic exercise without signs/symptoms of physical distress.    Intensity THRR unchanged THRR unchanged THRR unchanged THRR unchanged         Progression   Progression Continue to progress workloads to maintain intensity without signs/symptoms of physical distress. Continue to progress workloads to maintain intensity without signs/symptoms of physical distress. Continue to progress workloads to maintain intensity without signs/symptoms of physical distress. Continue to progress workloads to maintain intensity without signs/symptoms of physical distress.         Resistance Training   Training Prescription Yes Yes Yes Yes    Weight 5 lbs 8 lbs 8 lbs 8 lbs    Reps 10-15 10-15 10-15 10-15    Time 10 Minutes 10 Minutes 10 Minutes 10 Minutes         Treadmill   MPH 2 2 2 2     Grade 1 1  1 1    Minutes 17 17 17 17     METs 2.81 2.81 2.81 2.81         NuStep   Level 3 4 4 4     SPM 88 79 96 97    Minutes 22 22 22 22     METs 1.8 1.8 1.9 1.9           Exercise Comments:   Exercise Comments    Row Name 07/18/20 1223 08/29/20 1131         Exercise Comments Pt completed his first day of exercise in cardiac rehab today. He was able to tolerate exercise well without any complaints. Reviewed home exercise program with patient. He walks 2 miles every day he is not in rehab. Going to get some light weights to do resistance training at home as well.             Exercise Goals and Review:   Exercise Goals    Row Name 07/14/20 1505 08/01/20 1515 08/22/20 1509 09/19/20 1300 10/17/20 1336     Exercise Goals   Increase  Physical Activity Yes Yes Yes Yes Yes   Intervention Provide advice, education, support and counseling about physical activity/exercise needs.;Develop an individualized exercise prescription for aerobic and resistive training based on initial evaluation findings, risk stratification, comorbidities and participant's personal goals. Provide advice, education, support and counseling about physical activity/exercise needs.;Develop an individualized exercise prescription for aerobic and resistive training based on initial evaluation findings, risk stratification, comorbidities and participant's personal goals. Provide advice, education, support and counseling about physical activity/exercise needs.;Develop an individualized exercise prescription for aerobic and resistive training based on initial evaluation findings, risk stratification, comorbidities and participant's personal goals. Provide advice, education, support and counseling about physical activity/exercise needs.;Develop an individualized exercise prescription for aerobic and resistive training based on initial evaluation findings, risk stratification, comorbidities and participant's personal goals. Provide advice, education, support and counseling about physical activity/exercise needs.;Develop an individualized exercise prescription for aerobic and resistive training based on initial evaluation findings, risk stratification, comorbidities and participant's personal goals.   Expected Outcomes Short Term: Attend rehab on a regular basis to increase amount of physical activity.;Long Term: Exercising regularly at least 3-5 days a week.;Long Term: Add in home exercise to make exercise part of routine and to increase amount of physical activity. Short Term: Attend rehab on a regular basis to increase amount of physical activity.;Long Term: Exercising regularly at least 3-5 days a week.;Long Term: Add in home exercise to make exercise part of routine and to  increase amount of physical activity. Short Term: Attend rehab on a regular basis to increase amount of physical activity.;Long Term: Add in home exercise to make exercise part of routine and to increase amount of physical activity.;Long Term: Exercising regularly at least 3-5 days a week. Short Term: Attend rehab on a regular basis to increase amount of physical activity.;Long Term: Add in home exercise to make exercise part of routine and to increase amount of physical activity.;Long Term: Exercising regularly at least 3-5 days a week. Short Term: Attend rehab on a regular basis to increase amount of physical activity.;Long Term: Add in home exercise to make exercise part of routine and to increase amount of physical activity.;Long Term: Exercising regularly at least 3-5 days a week.   Increase Strength and Stamina Yes Yes Yes Yes Yes   Intervention Provide advice, education, support and counseling about physical activity/exercise needs.;Develop an individualized exercise prescription for aerobic and resistive training based on  initial evaluation findings, risk stratification, comorbidities and participant's personal goals. Provide advice, education, support and counseling about physical activity/exercise needs.;Develop an individualized exercise prescription for aerobic and resistive training based on initial evaluation findings, risk stratification, comorbidities and participant's personal goals. Provide advice, education, support and counseling about physical activity/exercise needs.;Develop an individualized exercise prescription for aerobic and resistive training based on initial evaluation findings, risk stratification, comorbidities and participant's personal goals. Provide advice, education, support and counseling about physical activity/exercise needs.;Develop an individualized exercise prescription for aerobic and resistive training based on initial evaluation findings, risk stratification,  comorbidities and participant's personal goals. Provide advice, education, support and counseling about physical activity/exercise needs.;Develop an individualized exercise prescription for aerobic and resistive training based on initial evaluation findings, risk stratification, comorbidities and participant's personal goals.   Expected Outcomes Short Term: Increase workloads from initial exercise prescription for resistance, speed, and METs.;Short Term: Perform resistance training exercises routinely during rehab and add in resistance training at home;Long Term: Improve cardiorespiratory fitness, muscular endurance and strength as measured by increased METs and functional capacity (6MWT) Short Term: Increase workloads from initial exercise prescription for resistance, speed, and METs.;Short Term: Perform resistance training exercises routinely during rehab and add in resistance training at home;Long Term: Improve cardiorespiratory fitness, muscular endurance and strength as measured by increased METs and functional capacity (6MWT) Short Term: Increase workloads from initial exercise prescription for resistance, speed, and METs.;Short Term: Perform resistance training exercises routinely during rehab and add in resistance training at home;Long Term: Improve cardiorespiratory fitness, muscular endurance and strength as measured by increased METs and functional capacity (6MWT) Short Term: Increase workloads from initial exercise prescription for resistance, speed, and METs.;Short Term: Perform resistance training exercises routinely during rehab and add in resistance training at home;Long Term: Improve cardiorespiratory fitness, muscular endurance and strength as measured by increased METs and functional capacity (6MWT) Short Term: Increase workloads from initial exercise prescription for resistance, speed, and METs.;Short Term: Perform resistance training exercises routinely during rehab and add in resistance  training at home;Long Term: Improve cardiorespiratory fitness, muscular endurance and strength as measured by increased METs and functional capacity (6MWT)   Able to understand and use rate of perceived exertion (RPE) scale Yes Yes Yes Yes Yes   Intervention Provide education and explanation on how to use RPE scale Provide education and explanation on how to use RPE scale Provide education and explanation on how to use RPE scale Provide education and explanation on how to use RPE scale Provide education and explanation on how to use RPE scale   Expected Outcomes Short Term: Able to use RPE daily in rehab to express subjective intensity level;Long Term:  Able to use RPE to guide intensity level when exercising independently Short Term: Able to use RPE daily in rehab to express subjective intensity level;Long Term:  Able to use RPE to guide intensity level when exercising independently Short Term: Able to use RPE daily in rehab to express subjective intensity level;Long Term:  Able to use RPE to guide intensity level when exercising independently Short Term: Able to use RPE daily in rehab to express subjective intensity level;Long Term:  Able to use RPE to guide intensity level when exercising independently Short Term: Able to use RPE daily in rehab to express subjective intensity level;Long Term:  Able to use RPE to guide intensity level when exercising independently   Knowledge and understanding of Target Heart Rate Range (THRR) Yes Yes Yes Yes Yes   Intervention Provide education and explanation of THRR  including how the numbers were predicted and where they are located for reference Provide education and explanation of THRR including how the numbers were predicted and where they are located for reference Provide education and explanation of THRR including how the numbers were predicted and where they are located for reference Provide education and explanation of THRR including how the numbers were predicted  and where they are located for reference Provide education and explanation of THRR including how the numbers were predicted and where they are located for reference   Expected Outcomes Short Term: Able to state/look up THRR;Long Term: Able to use THRR to govern intensity when exercising independently;Short Term: Able to use daily as guideline for intensity in rehab Short Term: Able to state/look up THRR;Long Term: Able to use THRR to govern intensity when exercising independently;Short Term: Able to use daily as guideline for intensity in rehab Short Term: Able to state/look up THRR;Long Term: Able to use THRR to govern intensity when exercising independently;Short Term: Able to use daily as guideline for intensity in rehab Short Term: Able to state/look up THRR;Short Term: Able to use daily as guideline for intensity in rehab;Long Term: Able to use THRR to govern intensity when exercising independently Short Term: Able to state/look up THRR;Short Term: Able to use daily as guideline for intensity in rehab;Long Term: Able to use THRR to govern intensity when exercising independently   Able to check pulse independently Yes Yes Yes Yes Yes   Intervention Provide education and demonstration on how to check pulse in carotid and radial arteries.;Review the importance of being able to check your own pulse for safety during independent exercise Provide education and demonstration on how to check pulse in carotid and radial arteries.;Review the importance of being able to check your own pulse for safety during independent exercise Provide education and demonstration on how to check pulse in carotid and radial arteries.;Review the importance of being able to check your own pulse for safety during independent exercise Provide education and demonstration on how to check pulse in carotid and radial arteries.;Review the importance of being able to check your own pulse for safety during independent exercise Provide education  and demonstration on how to check pulse in carotid and radial arteries.;Review the importance of being able to check your own pulse for safety during independent exercise   Expected Outcomes Short Term: Able to explain why pulse checking is important during independent exercise;Long Term: Able to check pulse independently and accurately Short Term: Able to explain why pulse checking is important during independent exercise;Long Term: Able to check pulse independently and accurately Short Term: Able to explain why pulse checking is important during independent exercise;Long Term: Able to check pulse independently and accurately Short Term: Able to explain why pulse checking is important during independent exercise;Long Term: Able to check pulse independently and accurately Short Term: Able to explain why pulse checking is important during independent exercise;Long Term: Able to check pulse independently and accurately   Understanding of Exercise Prescription Yes Yes Yes Yes Yes   Intervention Provide education, explanation, and written materials on patient's individual exercise prescription Provide education, explanation, and written materials on patient's individual exercise prescription Provide education, explanation, and written materials on patient's individual exercise prescription Provide education, explanation, and written materials on patient's individual exercise prescription Provide education, explanation, and written materials on patient's individual exercise prescription   Expected Outcomes Short Term: Able to explain program exercise prescription;Long Term: Able to explain home exercise prescription to exercise independently  Short Term: Able to explain program exercise prescription;Long Term: Able to explain home exercise prescription to exercise independently Short Term: Able to explain program exercise prescription;Long Term: Able to explain home exercise prescription to exercise independently  Short Term: Able to explain program exercise prescription;Long Term: Able to explain home exercise prescription to exercise independently Short Term: Able to explain program exercise prescription;Long Term: Able to explain home exercise prescription to exercise independently          Exercise Goals Re-Evaluation :  Exercise Goals Re-Evaluation    Row Name 08/01/20 1515 08/22/20 1510 09/19/20 1349 10/17/20 1336       Exercise Goal Re-Evaluation   Exercise Goals Review Increase Strength and Stamina;Able to understand and use rate of perceived exertion (RPE) scale;Knowledge and understanding of Target Heart Rate Range (THRR);Able to check pulse independently;Understanding of Exercise Prescription Increase Physical Activity;Increase Strength and Stamina;Able to understand and use rate of perceived exertion (RPE) scale;Knowledge and understanding of Target Heart Rate Range (THRR);Able to check pulse independently;Understanding of Exercise Prescription Increase Physical Activity;Increase Strength and Stamina;Able to understand and use rate of perceived exertion (RPE) scale;Knowledge and understanding of Target Heart Rate Range (THRR);Able to check pulse independently;Understanding of Exercise Prescription Increase Physical Activity;Increase Strength and Stamina;Able to understand and use rate of perceived exertion (RPE) scale;Knowledge and understanding of Target Heart Rate Range (THRR);Able to check pulse independently;Understanding of Exercise Prescription    Comments Pt has attended 6 exercise sessions. He is unmotivated and does not push himself. His walking on the treadmill has improved. He currently exercises at 1.9 METs on the stepper, but could likely do much more if he tried. Will continue to monitor and progress as able. Pt has completed 13 exercise sessions. He has been consistently doing the same intensity on the NuStep and has improved a little on the treadmill. He is currently exercising at 1.9  METs on the NuStep. Will continue to monitor and progress exercise as able. Patient has completed 22 exercise sessions. He is tolerating exercise well and progressing well. He is exercising at 1.8 METs on the NuStep. Will continue to monitor and progress exercise as able. Pt has completed 24 exercise sessions. He missed a significant amount of time in December due to having COVID and having bronchitis. He will return to rehab on 10/14 to complete his walk test and will graduate from the program. I will discuss with him what his plans are to continue exercise when he is finished with the program. He is currently exercising at 1.9 METs on the stepper. Will continue to monitor and progress as able.    Expected Outcomes Through exercise at rehab and by engaging in a home exercise plan, the patient will be able to reach their goals. Through exercise at rehab and by engaging in a home exercise program the patient will reach their goals. Through exercising at rehab and a home exercise plan, patient will achieve his goals. Through exercising at rehab and a home exercise plan, patient will achieve his goals.            Discharge Exercise Prescription (Final Exercise Prescription Changes):  Exercise Prescription Changes - 10/05/20 1100      Response to Exercise   Blood Pressure (Admit) 130/76    Blood Pressure (Exercise) 140/74    Blood Pressure (Exit) 122/76    Heart Rate (Admit) 68 bpm    Heart Rate (Exercise) 98 bpm    Heart Rate (Exit) 77 bpm    Rating of Perceived Exertion (  Exercise) 11    Duration Continue with 30 min of aerobic exercise without signs/symptoms of physical distress.    Intensity THRR unchanged      Progression   Progression Continue to progress workloads to maintain intensity without signs/symptoms of physical distress.      Resistance Training   Training Prescription Yes    Weight 8 lbs    Reps 10-15    Time 10 Minutes      Treadmill   MPH 2    Grade 1    Minutes 17     METs 2.81      NuStep   Level 4    SPM 97    Minutes 22    METs 1.9           Nutrition:  Target Goals: Understanding of nutrition guidelines, daily intake of sodium 1500mg , cholesterol 200mg , calories 30% from fat and 7% or less from saturated fats, daily to have 5 or more servings of fruits and vegetables.  Biometrics:  Pre Biometrics - 09/19/20 1353      Pre Biometrics   Weight 101.4 kg            Nutrition Therapy Plan and Nutrition Goals:  Nutrition Therapy & Goals - 10/13/20 0745      Personal Nutrition Goals   Comments We continue to provide education through hand-outs regarding nutrition and making healthier choices.      Intervention Plan   Intervention Nutrition handout(s) given to patient.           Nutrition Assessments:  Nutrition Assessments - 07/14/20 1501      MEDFICTS Scores   Pre Score 19          MEDIFICTS Score Key:  ?70 Need to make dietary changes   40-70 Heart Healthy Diet  ? 40 Therapeutic Level Cholesterol Diet   Picture Your Plate Scores:  <84 Unhealthy dietary pattern with much room for improvement.  41-50 Dietary pattern unlikely to meet recommendations for good health and room for improvement.  51-60 More healthful dietary pattern, with some room for improvement.   >60 Healthy dietary pattern, although there may be some specific behaviors that could be improved.    Nutrition Goals Re-Evaluation:   Nutrition Goals Discharge (Final Nutrition Goals Re-Evaluation):   Psychosocial: Target Goals: Acknowledge presence or absence of significant depression and/or stress, maximize coping skills, provide positive support system. Participant is able to verbalize types and ability to use techniques and skills needed for reducing stress and depression.  Initial Review & Psychosocial Screening:  Initial Psych Review & Screening - 07/14/20 1503      Initial Review   Current issues with None Identified      Family  Dynamics   Good Support System? Yes    Comments Patient lives with his wife of many years. He denies any depression or anxiety. He has children and is getting ready to enjoy a new grandchild. He has a positive outlook regarding his future. Will continue to monitor.      Barriers   Psychosocial barriers to participate in program There are no identifiable barriers or psychosocial needs.;The patient should benefit from training in stress management and relaxation.      Screening Interventions   Interventions Encouraged to exercise           Quality of Life Scores:  Quality of Life - 07/14/20 1501      Quality of Life   Select Quality of Life  Quality of Life Scores   Health/Function Pre 24.4 %    Socioeconomic Pre 28.5 %    Psych/Spiritual Pre 24 %    Family Pre 28.8 %    GLOBAL Pre 25.89 %          Scores of 19 and below usually indicate a poorer quality of life in these areas.  A difference of  2-3 points is a clinically meaningful difference.  A difference of 2-3 points in the total score of the Quality of Life Index has been associated with significant improvement in overall quality of life, self-image, physical symptoms, and general health in studies assessing change in quality of life.  PHQ-9: Recent Review Flowsheet Data    Depression screen [facility] 2/9 07/14/2020   Decreased Interest 0   Down, Depressed, Hopeless 0   PHQ - 2 Score 0   Altered sleeping 0   Tired, decreased energy 1   Change in appetite 0   Feeling bad or failure about yourself  0   Trouble concentrating 0   Moving slowly or fidgety/restless 0   Suicidal thoughts 0   PHQ-9 Score 1   Difficult doing work/chores Not difficult at all     Interpretation of Total Score  Total Score Depression Severity:  1-4 = Minimal depression, 5-9 = Mild depression, 10-14 = Moderate depression, 15-19 = Moderately severe depression, 20-27 = Severe depression   Psychosocial Evaluation and Intervention:  Psychosocial  Evaluation - 07/14/20 1504      Psychosocial Evaluation & Interventions   Interventions Encouraged to exercise with the program and follow exercise prescription;Stress management education;Relaxation education    Comments Patient's initial QOL score was 25.89 overall and his PHQ-9 score was 1 with no psychosocial issues identified at his orientation visit.    Expected Outcomes Patient will have no psychosocial issues identified at discharge.    Continue Psychosocial Services  No Follow up required           Psychosocial Re-Evaluation:  Psychosocial Re-Evaluation    [place] Name 08/01/20 1257 08/19/20 1503 09/16/20 0905 10/13/20 0745       Psychosocial Re-Evaluation   Current issues with None Identified None Identified None Identified None Identified    Comments Patient continues to have no psychosocial issues identified. Will continue to monitor. Patient continues to have no psychosocial issues identified. Patient works hard during his sessions and seems to enjoy coming. He is very interactive with other patient in his class and with staff. Will continue to monitor. Patient continues to have no psychosocial issues identified. Patient works hard during his sessions and seems to enjoy coming and demonstrates a positive outlook regarding his future. He is very interactive with other patient in his class and with staff. Will continue to monitor. Patient continues to have no psychosocial issues identified completing 25 sessions. He works hard during his sessions and seems to enjoy coming and demonstrates a positive outlook regarding his future. He is very interactive with other patient in his class and with staff. Will continue to monitor.    Expected Outcomes Patient will have no psychosocial issues identified at discharge. Patient will have no psychosocial issues identified at discharge. Patient will have no psychosocial issues identified at discharge. Patient will have no psychosocial issues identified  at discharge.    Interventions Relaxation education;Encouraged to attend Cardiac Rehabilitation for the exercise;Stress management education Relaxation education;Encouraged to attend Cardiac Rehabilitation for the exercise;Stress management education Relaxation education;Encouraged to attend Cardiac Rehabilitation for the exercise;Stress management education  Relaxation education;Encouraged to attend Cardiac Rehabilitation for the exercise;Stress management education    Continue Psychosocial Services  No Follow up required No Follow up required No Follow up required No Follow up required           Psychosocial Discharge (Final Psychosocial Re-Evaluation):  Psychosocial Re-Evaluation - 10/13/20 0745      Psychosocial Re-Evaluation   Current issues with None Identified    Comments Patient continues to have no psychosocial issues identified completing 25 sessions. He works hard during his sessions and seems to enjoy coming and demonstrates a positive outlook regarding his future. He is very interactive with other patient in his class and with staff. Will continue to monitor.    Expected Outcomes Patient will have no psychosocial issues identified at discharge.    Interventions Relaxation education;Encouraged to attend Cardiac Rehabilitation for the exercise;Stress management education    Continue Psychosocial Services  No Follow up required           Vocational Rehabilitation: Provide vocational rehab assistance to qualifying candidates.   Vocational Rehab Evaluation & Intervention:  Vocational Rehab - 07/14/20 1501      Initial Vocational Rehab Evaluation & Intervention   Assessment shows need for Vocational Rehabilitation No      Vocational Rehab Re-Evaulation   Comments Patient is a retired Emergency planning/management officer. He is not interested in returning to work and does not need vocational rehab.           Education: Education Goals: Education classes will be provided on a weekly basis,  covering required topics. Participant will state understanding/return demonstration of topics presented.  Learning Barriers/Preferences:  Learning Barriers/Preferences - 07/14/20 1501      Learning Barriers/Preferences   Learning Barriers None    Learning Preferences Written Material;Video;Audio;Skilled Demonstration           Education Topics: Hypertension, Hypertension Reduction -Define heart disease and high blood pressure. Discus how high blood pressure affects the body and ways to reduce high blood pressure.   Exercise and Your Heart -Discuss why it is important to exercise, the FITT principles of exercise, normal and abnormal responses to exercise, and how to exercise safely. Flowsheet Row CARDIAC REHAB PHASE II EXERCISE from 10/05/2020 in [place]  Date 07/20/20  Educator DF  Instruction Review Code 2- Demonstrated Understanding      Angina -Discuss definition of angina, causes of angina, treatment of angina, and how to decrease risk of having angina. Flowsheet Row CARDIAC REHAB PHASE II EXERCISE from 10/05/2020 in [place]  Date 07/29/20  Educator DF  Instruction Review Code 2- Demonstrated Understanding      Cardiac Medications -Review what the following cardiac medications are used for, how they affect the body, and side effects that may occur when taking the medications.  Medications include Aspirin, Beta blockers, calcium channel blockers, ACE Inhibitors, angiotensin receptor blockers, diuretics, digoxin, and antihyperlipidemics. Flowsheet Row CARDIAC REHAB PHASE II EXERCISE from 10/05/2020 in [place]  Date 08/03/20  Educator DF  Instruction Review Code 2- Demonstrated Understanding      Congestive Heart Failure -Discuss the definition of CHF, how to live with CHF, the signs and symptoms of CHF, and how keep track of weight and sodium intake. Flowsheet Row CARDIAC REHAB PHASE II  EXERCISE from 10/05/2020 in [place]  Date 08/10/20  Educator DF  Instruction Review Code 2- Demonstrated Understanding      Heart Disease and Intimacy -Discus the effect sexual  activity has on the heart, how changes occur during intimacy as we age, and safety during sexual activity. Flowsheet Row CARDIAC REHAB PHASE II EXERCISE from 10/05/2020 in [place]  Date 08/17/20  Educator DF  Instruction Review Code 2- Demonstrated Understanding      Smoking Cessation / COPD -Discuss different methods to quit smoking, the health benefits of quitting smoking, and the definition of COPD. Flowsheet Row CARDIAC REHAB PHASE II EXERCISE from 10/05/2020 in [place]  Date 08/24/20  Educator DF  Instruction Review Code 2- Demonstrated Understanding      Nutrition I: Fats -Discuss the types of cholesterol, what cholesterol does to the heart, and how cholesterol levels can be controlled. Flowsheet Row CARDIAC REHAB PHASE II EXERCISE from 10/05/2020 in [place]  Date 08/31/20  Educator [facility]  Instruction Review Code 2- Demonstrated Understanding      Nutrition II: Labels -Discuss the different components of food labels and how to read food label [place] from 10/05/2020 in [place]  Date 09/07/20  Educator DF  Instruction Review Code 2- Demonstrated Understanding      Heart Parts/Heart Disease and PAD -Discuss the anatomy of the heart, the pathway of blood circulation through the heart, and these are affected by heart disease.   Stress I: Signs and Symptoms -Discuss the causes of stress, how stress may lead to anxiety and depression, and ways to limit stress.   Stress II: Relaxation -Discuss different types of relaxation techniques to limit stress.   Warning Signs of Stroke / TIA -Discuss definition of a stroke, what the  signs and symptoms are of a stroke, and how to identify when someone is having stroke. Flowsheet Row CARDIAC REHAB PHASE II EXERCISE from 10/05/2020 in [place]  Date 10/05/20  Educator DF  Instruction Review Code 2- Demonstrated Understanding      Knowledge Questionnaire Score:  Knowledge Questionnaire Score - 07/14/20 1501      Knowledge Questionnaire Score   Pre Score 23/24           Core Components/Risk Factors/Patient Goals at Admission:  Personal Goals and Risk Factors at Admission - 07/14/20 1502      Core Components/Risk Factors/Patient Goals on Admission    Weight Management Obesity    Personal Goal Other Yes    Personal Goal Increase strength and stamina. Increase strength in his legs especially. Maintain his health.    Intervention Patient will attend CR 2 days/week and supplement with exercise at home 3 days/week.    Expected Outcomes Patient will meet both program and personal goals.           Core Components/Risk Factors/Patient Goals Review:   Goals and Risk Factor Review    Row Name 08/01/20 1258 08/19/20 1503 09/16/20 0905 10/13/20 0745       Core Components/Risk Factors/Patient Goals Review   Personal Goals Review Weight Management/Obesity Weight Management/Obesity Weight Management/Obesity Weight Management/Obesity    Review Patient has completed 7 sessions losing 3 lbs since his initial visit. He has multiple risk factors for CAD and is participating in CR to modify risk factors. His personal goals are to increase strength and stamina; increase strength in his legs; and maintain his health. His attendance is consistent and he is doing well in the program with progression. Will continue to monitor. Patient has completed 14 sessions maintaining his weight in last 30 day review. He continues to do  well in the program with progressions and consistent attendance. His b/p is WNL. His personal goals are increase strength and stamin;  increase strength in legs; and to maintain his health. His balance has improved and his gait on the treadmill has improved. He feels he is working toward meeting his goals. Will continue to monitor for progress. Patient has completed 22 sessions maintaining his weight since last 30 day review. He continues to do well in the program with progressions and consistent attendance. His gait and balance have improved and he is getting stronger and gaining stamina. His personal goals are to increase strength and stamin and increase strength in his legs and maintain his health. His blood pressure continues to be WNL. Will continue to monitor for progress. Patient has completed 25 sessions maintaining his weight since last 30 day review.  His attendance has been inconsistent due to acute illness. He has done very well in the program with improved balance, strength, and stamina. He will graduate soon and will hopefully continue to exercise and continue to meet his goals. Will continue to monitor for progress.    Expected Outcomes Patient will complete the program meeting both his personal and program goals. Patient will complete the program meeting both his personal and program goals. Patient will complete the program meeting both his personal and program goals. Patient will complete the program meeting both his personal and program goals.           Core Components/Risk Factors/Patient Goals at Discharge (Final Review):   Goals and Risk Factor Review - 10/13/20 0745      Core Components/Risk Factors/Patient Goals Review   Personal Goals Review Weight Management/Obesity    Review Patient has completed 25 sessions maintaining his weight since last 30 day review.  His attendance has been inconsistent due to acute illness. He has done very well in the program with improved balance, strength, and stamina. He will graduate soon and will hopefully continue to exercise and continue to meet his goals. Will continue to  monitor for progress.    Expected Outcomes Patient will complete the program meeting both his personal and program goals.           ITP Comments:   Comments: ITP REVIEW Pt is making expected progress toward Cardiac Rehab goals after completing 25 sessions. Recommend continued exercise, life style modification, education, and increased stamina and strength.

## 2020-10-21 ENCOUNTER — Encounter (HOSPITAL_COMMUNITY)
Admission: RE | Admit: 2020-10-21 | Discharge: 2020-10-21 | Disposition: A | Discharge status: Home / Self Care | Payer: Medicare Other | Source: Ambulatory Visit | Attending: Internal Medicine | Admitting: Internal Medicine

## 2020-10-21 ENCOUNTER — Other Ambulatory Visit: Payer: Self-pay

## 2020-10-21 VITALS — Ht 70.0 in | Wt 215.6 lb

## 2020-10-21 DIAGNOSIS — Z95820 Peripheral vascular angioplasty status with implants and grafts: Secondary | ICD-10-CM | POA: Diagnosis not present

## 2020-10-21 DIAGNOSIS — Z79899 Other long term (current) drug therapy: Secondary | ICD-10-CM | POA: Diagnosis not present

## 2020-10-21 DIAGNOSIS — Z87891 Personal history of nicotine dependence: Secondary | ICD-10-CM | POA: Diagnosis not present

## 2020-10-21 DIAGNOSIS — I2510 Atherosclerotic heart disease of native coronary artery without angina pectoris: Secondary | ICD-10-CM | POA: Diagnosis not present

## 2020-10-21 NOTE — Progress Notes (Signed)
Discharge Progress Report  Patient Details  Name: Jeremy Lopez MRN: [id number] Date of Birth: [birth year] Referring Provider:   Flowsheet Row CARDIAC REHAB PHASE II ORIENTATION from 07/14/2020 in [place]  Referring Provider Dr. Harrington Challenger       Number of Visits: 26  Reason for Discharge:  Patient reached a stable level of exercise. Patient independent in their exercise. Patient has met program and personal goals.  Smoking History:  Social History   Tobacco Use  Smoking Status Former Smoker  . Types: Cigarettes  . Start date: 10/25/1966  . Quit date: 10/25/1998  . Years since quitting: 22.0  Smokeless Tobacco Never Used    Diagnosis:  Status post angioplasty with stent  ADL [facility]:   Initial Exercise Prescription:  Initial Exercise Prescription - 07/14/20 1500      Date of Initial Exercise RX and Referring Provider   Date 07/14/20    Referring Provider Dr. Harrington Challenger    Expected Discharge Date 10/07/20      Treadmill   MPH 1.5    Grade 0    Minutes 17    METs 2.15      NuStep   Level 2    SPM 80    Minutes 22      Prescription Details   Frequency (times per week) 2    Duration Progress to 30 minutes of continuous aerobic without signs/symptoms of physical distress      Intensity   THRR 40-80% of Max Heartrate 60-121    Ratings of Perceived Exertion 11-13    Perceived Dyspnea 0-4      Resistance Training   Training Prescription Yes    Weight 3 lbs    Reps 10-15           Discharge Exercise Prescription (Final Exercise Prescription Changes):  Exercise Prescription Changes - 10/05/20 1100      Response to Exercise   Blood Pressure (Admit) 130/76    Blood Pressure (Exercise) 140/74    Blood Pressure (Exit) 122/76    Heart Rate (Admit) 68 bpm    Heart Rate (Exercise) 98 bpm    Heart Rate (Exit) 77 bpm    Rating of Perceived Exertion (Exercise) 11    Duration Continue with 30 min of aerobic exercise without signs/symptoms of  physical distress.    Intensity THRR unchanged      Progression   Progression Continue to progress workloads to maintain intensity without signs/symptoms of physical distress.      Resistance Training   Training Prescription Yes    Weight 8 lbs    Reps 10-15    Time 10 Minutes      Treadmill   MPH 2    Grade 1    Minutes 17    METs 2.81      NuStep   Level 4    SPM 97    Minutes 22    METs 1.9           Functional Capacity:  6 Minute Walk    Row Name 07/14/20 1501 10/21/20 1411       6 Minute Walk   Phase Initial Discharge    Distance 1200 feet 1300 feet    Walk Time 6 minutes 6 minutes    # of Rest Breaks 0 0    MPH 2.27 2.46    METS 2.51 2.8    RPE 11 12    VO2 Peak 8.78 9.82    Symptoms No No  Resting HR 69 bpm 79 bpm    Resting BP 118/60 136/72    Resting Oxygen Saturation  96 % 99 %    Exercise Oxygen Saturation  during 6 min walk 96 % 98 %    Max Ex. HR 86 bpm 90 bpm    Max Ex. BP 146/76 148/72    2 Minute Post BP 130/76 136/70           Psychological, QOL, Others - Outcomes: PHQ 2/9: Depression screen [facility] 2/9 10/21/2020 07/14/2020  Decreased Interest 0 0  Down, Depressed, Hopeless 0 0  PHQ - 2 Score 0 0  Altered sleeping 0 0  Tired, decreased energy 0 1  Change in appetite 0 0  Feeling bad or failure about yourself  0 0  Trouble concentrating 0 0  Moving slowly or fidgety/restless 0 0  Suicidal thoughts 0 0  PHQ-9 Score 0 1  Difficult doing work/chores Not difficult at all Not difficult at all    Quality of Life:  Quality of Life - 10/21/20 1413      Quality of Life   Select Quality of Life      Quality of Life Scores   Health/Function Pre 24.4 %    Health/Function Post 30 %    Health/Function % Change 22.95 %    Socioeconomic Pre 28.5 %    Socioeconomic Post 30 %    Socioeconomic % Change  5.26 %    Psych/Spiritual Pre 24 %    Psych/Spiritual Post 30 %    Psych/Spiritual % Change 25 %    Family Pre 28.8 %    Family Post  30 %    Family % Change 4.17 %    GLOBAL Pre 25.89 %    GLOBAL Post 30 %    GLOBAL % Change 15.87 %           Personal Goals: Goals established at orientation with interventions provided to work toward goal.  Personal Goals and Risk Factors at Admission - 07/14/20 1502      Core Components/Risk Factors/Patient Goals on Admission    Weight Management Obesity    Personal Goal Other Yes    Personal Goal Increase strength and stamina. Increase strength in his legs especially. Maintain his health.    Intervention Patient will attend CR 2 days/week and supplement with exercise at home 3 days/week.    Expected Outcomes Patient will meet both program and personal goals.            Personal Goals Discharge:  Goals and Risk Factor Review    Row Name 08/01/20 1258 08/19/20 1503 09/16/20 0905 10/13/20 0745 10/21/20 1419     Core Components/Risk Factors/Patient Goals Review   Personal Goals Review Weight Management/Obesity Weight Management/Obesity Weight Management/Obesity Weight Management/Obesity Weight Management/Obesity   Review Patient has completed 7 sessions losing 3 lbs since his initial visit. He has multiple risk factors for CAD and is participating in CR to modify risk factors. His personal goals are to increase strength and stamina; increase strength in his legs; and maintain his health. His attendance is consistent and he is doing well in the program with progression. Will continue to monitor. Patient has completed 14 sessions maintaining his weight in last 30 day review. He continues to do well in the program with progressions and consistent attendance. His b/p is WNL. His personal goals are increase strength and stamin; increase strength in legs; and to maintain his health. His balance has improved and  his gait on the treadmill has improved. He feels he is working toward meeting his goals. Will continue to monitor for progress. Patient has completed 22 sessions maintaining his  weight since last 30 day review. He continues to do well in the program with progressions and consistent attendance. His gait and balance have improved and he is getting stronger and gaining stamina. His personal goals are to increase strength and stamin and increase strength in his legs and maintain his health. His blood pressure continues to be WNL. Will continue to monitor for progress. Patient has completed 25 sessions maintaining his weight since last 30 day review.  His attendance has been inconsistent due to acute illness. He has done very well in the program with improved balance, strength, and stamina. He will graduate soon and will hopefully continue to exercise and continue to meet his goals. Will continue to monitor for progress. Patient has completed 26 exercise sessions. He achieved his goal of losing weight throughout the program. He lost a total of 7 lbs throughout the duration of his time in the program. He has improved his strength, balance, and endurance throughout the program. He hopes to continue his progress at home.   Expected Outcomes Patient will complete the program meeting both his personal and program goals. Patient will complete the program meeting both his personal and program goals. Patient will complete the program meeting both his personal and program goals. Patient will complete the program meeting both his personal and program goals. Patient will continue to progress and reach their goals outside of rehab.          Exercise Goals and Review:  Exercise Goals    Row Name 07/14/20 1505 08/01/20 1515 08/22/20 1509 09/19/20 1300 10/17/20 1336     Exercise Goals   Increase Physical Activity Yes Yes Yes Yes Yes   Intervention Provide advice, education, support and counseling about physical activity/exercise needs.;Develop an individualized exercise prescription for aerobic and resistive training based on initial evaluation findings, risk stratification, comorbidities and  participant's personal goals. Provide advice, education, support and counseling about physical activity/exercise needs.;Develop an individualized exercise prescription for aerobic and resistive training based on initial evaluation findings, risk stratification, comorbidities and participant's personal goals. Provide advice, education, support and counseling about physical activity/exercise needs.;Develop an individualized exercise prescription for aerobic and resistive training based on initial evaluation findings, risk stratification, comorbidities and participant's personal goals. Provide advice, education, support and counseling about physical activity/exercise needs.;Develop an individualized exercise prescription for aerobic and resistive training based on initial evaluation findings, risk stratification, comorbidities and participant's personal goals. Provide advice, education, support and counseling about physical activity/exercise needs.;Develop an individualized exercise prescription for aerobic and resistive training based on initial evaluation findings, risk stratification, comorbidities and participant's personal goals.   Expected Outcomes Short Term: Attend rehab on a regular basis to increase amount of physical activity.;Long Term: Exercising regularly at least 3-5 days a week.;Long Term: Add in home exercise to make exercise part of routine and to increase amount of physical activity. Short Term: Attend rehab on a regular basis to increase amount of physical activity.;Long Term: Exercising regularly at least 3-5 days a week.;Long Term: Add in home exercise to make exercise part of routine and to increase amount of physical activity. Short Term: Attend rehab on a regular basis to increase amount of physical activity.;Long Term: Add in home exercise to make exercise part of routine and to increase amount of physical activity.;Long Term: Exercising regularly at least 3-5 days  a week. Short Term: Attend  rehab on a regular basis to increase amount of physical activity.;Long Term: Add in home exercise to make exercise part of routine and to increase amount of physical activity.;Long Term: Exercising regularly at least 3-5 days a week. Short Term: Attend rehab on a regular basis to increase amount of physical activity.;Long Term: Add in home exercise to make exercise part of routine and to increase amount of physical activity.;Long Term: Exercising regularly at least 3-5 days a week.   Increase Strength and Stamina Yes Yes Yes Yes Yes   Intervention Provide advice, education, support and counseling about physical activity/exercise needs.;Develop an individualized exercise prescription for aerobic and resistive training based on initial evaluation findings, risk stratification, comorbidities and participant's personal goals. Provide advice, education, support and counseling about physical activity/exercise needs.;Develop an individualized exercise prescription for aerobic and resistive training based on initial evaluation findings, risk stratification, comorbidities and participant's personal goals. Provide advice, education, support and counseling about physical activity/exercise needs.;Develop an individualized exercise prescription for aerobic and resistive training based on initial evaluation findings, risk stratification, comorbidities and participant's personal goals. Provide advice, education, support and counseling about physical activity/exercise needs.;Develop an individualized exercise prescription for aerobic and resistive training based on initial evaluation findings, risk stratification, comorbidities and participant's personal goals. Provide advice, education, support and counseling about physical activity/exercise needs.;Develop an individualized exercise prescription for aerobic and resistive training based on initial evaluation findings, risk stratification, comorbidities and participant's personal  goals.   Expected Outcomes Short Term: Increase workloads from initial exercise prescription for resistance, speed, and METs.;Short Term: Perform resistance training exercises routinely during rehab and add in resistance training at home;Long Term: Improve cardiorespiratory fitness, muscular endurance and strength as measured by increased METs and functional capacity (6MWT) Short Term: Increase workloads from initial exercise prescription for resistance, speed, and METs.;Short Term: Perform resistance training exercises routinely during rehab and add in resistance training at home;Long Term: Improve cardiorespiratory fitness, muscular endurance and strength as measured by increased METs and functional capacity (6MWT) Short Term: Increase workloads from initial exercise prescription for resistance, speed, and METs.;Short Term: Perform resistance training exercises routinely during rehab and add in resistance training at home;Long Term: Improve cardiorespiratory fitness, muscular endurance and strength as measured by increased METs and functional capacity (6MWT) Short Term: Increase workloads from initial exercise prescription for resistance, speed, and METs.;Short Term: Perform resistance training exercises routinely during rehab and add in resistance training at home;Long Term: Improve cardiorespiratory fitness, muscular endurance and strength as measured by increased METs and functional capacity (6MWT) Short Term: Increase workloads from initial exercise prescription for resistance, speed, and METs.;Short Term: Perform resistance training exercises routinely during rehab and add in resistance training at home;Long Term: Improve cardiorespiratory fitness, muscular endurance and strength as measured by increased METs and functional capacity (6MWT)   Able to understand and use rate of perceived exertion (RPE) scale Yes Yes Yes Yes Yes   Intervention Provide education and explanation on how to use RPE scale Provide  education and explanation on how to use RPE scale Provide education and explanation on how to use RPE scale Provide education and explanation on how to use RPE scale Provide education and explanation on how to use RPE scale   Expected Outcomes Short Term: Able to use RPE daily in rehab to express subjective intensity level;Long Term:  Able to use RPE to guide intensity level when exercising independently Short Term: Able to use RPE daily in rehab to express subjective intensity  level;Long Term:  Able to use RPE to guide intensity level when exercising independently Short Term: Able to use RPE daily in rehab to express subjective intensity level;Long Term:  Able to use RPE to guide intensity level when exercising independently Short Term: Able to use RPE daily in rehab to express subjective intensity level;Long Term:  Able to use RPE to guide intensity level when exercising independently Short Term: Able to use RPE daily in rehab to express subjective intensity level;Long Term:  Able to use RPE to guide intensity level when exercising independently   Knowledge and understanding of Target Heart Rate Range (THRR) Yes Yes Yes Yes Yes   Intervention Provide education and explanation of THRR including how the numbers were predicted and where they are located for reference Provide education and explanation of THRR including how the numbers were predicted and where they are located for reference Provide education and explanation of THRR including how the numbers were predicted and where they are located for reference Provide education and explanation of THRR including how the numbers were predicted and where they are located for reference Provide education and explanation of THRR including how the numbers were predicted and where they are located for reference   Expected Outcomes Short Term: Able to state/look up THRR;Long Term: Able to use THRR to govern intensity when exercising independently;Short Term: Able to use  daily as guideline for intensity in rehab Short Term: Able to state/look up THRR;Long Term: Able to use THRR to govern intensity when exercising independently;Short Term: Able to use daily as guideline for intensity in rehab Short Term: Able to state/look up THRR;Long Term: Able to use THRR to govern intensity when exercising independently;Short Term: Able to use daily as guideline for intensity in rehab Short Term: Able to state/look up THRR;Short Term: Able to use daily as guideline for intensity in rehab;Long Term: Able to use THRR to govern intensity when exercising independently Short Term: Able to state/look up THRR;Short Term: Able to use daily as guideline for intensity in rehab;Long Term: Able to use THRR to govern intensity when exercising independently   Able to check pulse independently Yes Yes Yes Yes Yes   Intervention Provide education and demonstration on how to check pulse in carotid and radial arteries.;Review the importance of being able to check your own pulse for safety during independent exercise Provide education and demonstration on how to check pulse in carotid and radial arteries.;Review the importance of being able to check your own pulse for safety during independent exercise Provide education and demonstration on how to check pulse in carotid and radial arteries.;Review the importance of being able to check your own pulse for safety during independent exercise Provide education and demonstration on how to check pulse in carotid and radial arteries.;Review the importance of being able to check your own pulse for safety during independent exercise Provide education and demonstration on how to check pulse in carotid and radial arteries.;Review the importance of being able to check your own pulse for safety during independent exercise   Expected Outcomes Short Term: Able to explain why pulse checking is important during independent exercise;Long Term: Able to check pulse independently and  accurately Short Term: Able to explain why pulse checking is important during independent exercise;Long Term: Able to check pulse independently and accurately Short Term: Able to explain why pulse checking is important during independent exercise;Long Term: Able to check pulse independently and accurately Short Term: Able to explain why pulse checking is important during independent exercise;Long  Term: Able to check pulse independently and accurately Short Term: Able to explain why pulse checking is important during independent exercise;Long Term: Able to check pulse independently and accurately   Understanding of Exercise Prescription Yes Yes Yes Yes Yes   Intervention Provide education, explanation, and written materials on patient's individual exercise prescription Provide education, explanation, and written materials on patient's individual exercise prescription Provide education, explanation, and written materials on patient's individual exercise prescription Provide education, explanation, and written materials on patient's individual exercise prescription Provide education, explanation, and written materials on patient's individual exercise prescription   Expected Outcomes Short Term: Able to explain program exercise prescription;Long Term: Able to explain home exercise prescription to exercise independently Short Term: Able to explain program exercise prescription;Long Term: Able to explain home exercise prescription to exercise independently Short Term: Able to explain program exercise prescription;Long Term: Able to explain home exercise prescription to exercise independently Short Term: Able to explain program exercise prescription;Long Term: Able to explain home exercise prescription to exercise independently Short Term: Able to explain program exercise prescription;Long Term: Able to explain home exercise prescription to exercise independently          Exercise Goals Re-Evaluation:  Exercise  Goals Re-Evaluation    Row Name 08/01/20 1515 08/22/20 1510 09/19/20 1349 10/17/20 1336       Exercise Goal Re-Evaluation   Exercise Goals Review Increase Strength and Stamina;Able to understand and use rate of perceived exertion (RPE) scale;Knowledge and understanding of Target Heart Rate Range (THRR);Able to check pulse independently;Understanding of Exercise Prescription Increase Physical Activity;Increase Strength and Stamina;Able to understand and use rate of perceived exertion (RPE) scale;Knowledge and understanding of Target Heart Rate Range (THRR);Able to check pulse independently;Understanding of Exercise Prescription Increase Physical Activity;Increase Strength and Stamina;Able to understand and use rate of perceived exertion (RPE) scale;Knowledge and understanding of Target Heart Rate Range (THRR);Able to check pulse independently;Understanding of Exercise Prescription Increase Physical Activity;Increase Strength and Stamina;Able to understand and use rate of perceived exertion (RPE) scale;Knowledge and understanding of Target Heart Rate Range (THRR);Able to check pulse independently;Understanding of Exercise Prescription    Comments Pt has attended 6 exercise sessions. He is unmotivated and does not push himself. His walking on the treadmill has improved. He currently exercises at 1.9 METs on the stepper, but could likely do much more if he tried. Will continue to monitor and progress as able. Pt has completed 13 exercise sessions. He has been consistently doing the same intensity on the NuStep and has improved a little on the treadmill. He is currently exercising at 1.9 METs on the NuStep. Will continue to monitor and progress exercise as able. Patient has completed 22 exercise sessions. He is tolerating exercise well and progressing well. He is exercising at 1.8 METs on the NuStep. Will continue to monitor and progress exercise as able. Pt has completed 24 exercise sessions. He missed a  significant amount of time in December due to having COVID and having bronchitis. He will return to rehab on 10/14 to complete his walk test and will graduate from the program. I will discuss with him what his plans are to continue exercise when he is finished with the program. He is currently exercising at 1.9 METs on the stepper. Will continue to monitor and progress as able.    Expected Outcomes Through exercise at rehab and by engaging in a home exercise plan, the patient will be able to reach their goals. Through exercise at rehab and by engaging in a home exercise  program the patient will reach their goals. Through exercising at rehab and a home exercise plan, patient will achieve his goals. Through exercising at rehab and a home exercise plan, patient will achieve his goals.           Nutrition & Weight - Outcomes:  Pre Biometrics - 10/21/20 1412      Pre Biometrics   Height '5\' 10"'  (1.778 m)    Weight 97.8 kg    Waist Circumference 43 inches    Hip Circumference 43.5 inches    Waist to Hip Ratio 0.99 %    BMI (Calculated) 30.94    Triceps Skinfold 18 mm    % Body Fat 31.2 %    Grip Strength 42.1 kg    Flexibility 14.5 in    Single Leg Stand 12 seconds            Nutrition:  Nutrition Therapy & Goals - 10/13/20 0745      Personal Nutrition Goals   Comments We continue to provide education through hand-outs regarding nutrition and making healthier choices.      Intervention Plan   Intervention Nutrition handout(s) given to patient.           Nutrition Discharge:  Nutrition Assessments - 10/21/20 1418      MEDFICTS Scores   Pre Score 19    Post Score 60    Score Difference 41           Education Questionnaire Score:  Knowledge Questionnaire Score - 10/21/20 1419      Knowledge Questionnaire Score   Pre Score 23/24    Post Score 21/24          Patient graduated from [facility] today on 10/21/20 after completing 26 sessions. They  achieved LTG of 30 minutes of aerobic exercise at Max Met level of 2.8 METs. All patients vitals are WNL. Discharge instruction has been reviewed in detail and patient stated an understanding of material given. Patient plans to exercise at home. Cardiac Rehab staff will make f/u call. Patient had no complaints of any abnormal S/S or pain on their exit visit.     Goals reviewed with patient; copy given to patient.

## 2020-10-21 NOTE — Progress Notes (Signed)
Daily Session Note  Patient Details  Name: Jeremy Lopez MRN: [id number] Date of Birth: 03-02-1951 Referring Provider:   Flowsheet Row CARDIAC REHAB PHASE II ORIENTATION from 07/14/2020 in [facility]  Referring Provider Dr. Harrington Challenger      Encounter Date: 10/21/2020  Check In:  Session Check In - 10/21/20 1100      Check-In   Supervising physician immediately available to respond to emergencies [facility] MD immediately available    Physician(s) Dr. Harl Bowie    Location [facility]    Staff Present Aundra Dubin, RN, BSN;Madison Audria Nine, MS, Exercise Physiologist;Ynez Eugenio Kris Mouton, MS, ACSM-CEP, Exercise Physiologist    Virtual Visit No    Medication changes reported     No    Fall or balance concerns reported    No    Tobacco Cessation No Change    Warm-up and Cool-down Performed as group-led instruction    Resistance Training Performed Yes    VAD Patient? No    PAD/SET Patient? No      Pain Assessment   Currently in Pain? No/denies    Multiple Pain Sites No           Capillary Blood Glucose: No results found for this or any previous visit (from the past 24 hour(s)).    Social History   Tobacco Use  Smoking Status Former Smoker  . Types: Cigarettes  . Start date: 10/25/1966  . Quit date: 10/25/1998  . Years since quitting: 22.0  Smokeless Tobacco Never Used    Goals Met:  Independence with exercise equipment Exercise tolerated well No report of cardiac concerns or symptoms Strength training completed today  Goals Unmet:  Not Applicable  Comments: checkout time is 1200   Dr. Kathie Dike is Medical Director for [facility].

## 2020-11-24 DIAGNOSIS — J449 Chronic obstructive pulmonary disease, unspecified: Secondary | ICD-10-CM | POA: Diagnosis not present

## 2020-12-27 DIAGNOSIS — J449 Chronic obstructive pulmonary disease, unspecified: Secondary | ICD-10-CM | POA: Diagnosis not present

## 2020-12-28 NOTE — Progress Notes (Unsigned)
Cardiology Office Note   Date:  12/29/2020   ID:  Jeremy Lopez, DOB September 26, 1951, MRN [id number]  PCP:  Asencion Noble, MD  Cardiologist:   Dorris Carnes, MD    Patient presents for f/u of CAD    History of Present Illness: Jeremy Lopez is a 70 y.o. male with a history of  CAD   He is s/p PTCA/DES in 2010   Pt had a stress test in 2010  I saw the pt earlier this summer (June, 2021)  At the time he complained of chest pressure   Myovue was done and showed no ischemia   The pt continued to have CP, giving out with activity, SOB   CT of chest showed radiation fibrosis    With continued worsened symptoms,  I recomm that he undergo L heart catheterizaoin    This was done 7/20.  This showed 99% mid RCA leasion  He underwent atherectomy with DES placment.   The pt also had a patent Prox LAD stent.   LVEF 55 to 65%  Since seen the pt says he is feeling good   Breathing is good   Denies CP  No dizzines    Current Meds  Medication Sig  . albuterol (VENTOLIN HFA) 108 (90 Base) MCG/ACT inhaler Inhale 2 puffs into the lungs every 6 (six) hours as needed for wheezing or shortness of breath.  Marland Kitchen amLODipine (NORVASC) 2.5 MG tablet Take 1 tablet (2.5 mg total) by mouth daily.  Marland Kitchen aspirin 81 MG tablet Take 81 mg by mouth daily.  Marland Kitchen CARTIA XT 240 MG 24 hr capsule TAKE ONE CAPSULE (240 MG TOTAL) BY MOUTHDAILY.  Marland Kitchen Cholecalciferol (VITAMIN D) 50 MCG (2000 UT) tablet Take 2,000 Units by mouth daily.  . clopidogrel (PLAVIX) 75 MG tablet Take 1 tablet (75 mg total) by mouth daily with breakfast.  . fluticasone (FLONASE) 50 MCG/ACT nasal spray Place 2 sprays into both nostrils daily as needed for allergies or rhinitis.  . metoprolol succinate (TOPROL-XL) 50 MG 24 hr tablet TAKE ONE TABLET (50 MG TOTAL) BY MOUTH DAILY.  . nitroGLYCERIN (NITROSTAT) 0.4 MG SL tablet Place 1 tablet (0.4 mg total) under the tongue every 5 (five) minutes as needed for chest pain.  . rosuvastatin (CRESTOR) 40 MG tablet TAKE ONE (1) TABLET  BY MOUTH EVERY DAY     Allergies:   Cephalexin   Past Medical History:  Diagnosis Date  . CAD (coronary artery disease), native coronary artery    DES proximal LAD 07/2009; scattered 20-40% lesions; normal LVEF  . Essential hypertension   . Hyperlipidemia   . Small cell lung cancer ([place])    Right upper lobe 2008; no evidence of disease since treatment ended in 11/2007    Past Surgical History:  Procedure Laterality Date  . CORONARY ATHERECTOMY N/A 04/26/2020   Procedure: CORONARY ATHERECTOMY;  Surgeon: Jettie Booze, MD;  Location: [facility];  Service: Cardiovascular;  Laterality: N/A;  . CORONARY STENT INTERVENTION N/A 04/26/2020   Procedure: CORONARY STENT INTERVENTION;  Surgeon: Jettie Booze, MD;  Location: [place] [facility];  Service: Cardiovascular;  Laterality: N/A;  . Insertion of left subclavian Port-A-Cath  2008  . INTRAVASCULAR ULTRASOUND/IVUS N/A 04/26/2020   Procedure: Intravascular Ultrasound/IVUS;  Surgeon: Jettie Booze, MD;  Location: [facility];  Service: Cardiovascular;  Laterality: N/A;  . RIGHT/LEFT HEART CATH AND CORONARY ANGIOGRAPHY N/A 04/26/2020   Procedure: RIGHT/LEFT HEART CATH AND CORONARY ANGIOGRAPHY;  Surgeon:  Jettie Booze, MD;  Location: [facility];  Service: Cardiovascular;  Laterality: N/A;  . TEMPORARY PACEMAKER N/A 04/26/2020   Procedure: TEMPORARY PACEMAKER;  Surgeon: Jettie Booze, MD;  Location: [facility];  Service: Cardiovascular;  Laterality: N/A;  . Video bronchoscopy and video mediastinoscopy  2008     Social History:  The patient  reports that he quit smoking about 22 years ago. His smoking use included cigarettes. He started smoking about 54 years ago. He has never used smokeless tobacco. He reports current alcohol use of about 14.0 standard drinks of alcohol per week. He reports that he does not use drugs.   Family History:  The patient's family history includes CAD in his  brother; Cancer in his father; Hypertension in his brother.    ROS:  Please see the history of present illness. All other systems are reviewed and  Negative to the above problem except as noted.    PHYSICAL EXAM: VS:  BP 138/80 (BP Location: Right Arm, Patient Position: Sitting, Cuff Size: Normal)   Pulse 73   Ht 5\' 10"  (1.778 m)   Wt 215 lb 6.4 oz (97.7 kg)   SpO2 96%   BMI 30.91 kg/m   GEN: Obese 70 yo  in no acute distress  HEENT: normal  Neck: no JVD, carotid bruits Cardiac: RRR; no murmurs;  No signif LE edema  Respiratory:  Mild rhonchi   Moving air   GI: soft, nontender, nondistended, + BS  No hepatomegaly  MS: no deformity Moving all extremities   Skin: warm and dry,  Sl erythema Neuro:  Strength and sensation are intact Psych: euthymic mood, full affect   EKG:  EKG is not ordered today.  CARDIAC CATH   04/26/20    Mid RCA lesion is 99% stenosed, heavily calcified.  Following atherectomy, A drug-eluting stent was successfully placed using a STENT RESOLUTE ONYX 3.5X15, postdilated to 3.75 mm; stent optimized with IVUS.  Post intervention, there is a 0% residual stenosis.  Mid RCA to Dist RCA lesion is 40% stenosed.  Previously placed Prox LAD stent, is widely patent.  The left ventricular systolic function is normal.  LV end diastolic pressure is normal.  The left ventricular ejection fraction is 55-65% by visual estimate.  There is no aortic valve stenosis.   Continue aggressive secondary prevention.  Still eligible for same-day discharge.  Consider Plavix monotherapy given his diffuse disease after DAPT is completed.   Chest CT  04/21/20   No pulmonary emboli.  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  Aortic Atherosclerosis (ICD10-I70.0). Coronary artery calcification.  Lipid Panel    Component Value Date/Time   CHOL 128 12/28/2019 1501   CHOL 120 01/04/2016 1611   TRIG 175 (H) 12/28/2019 1501    HDL 42 12/28/2019 1501   HDL 47 01/04/2016 1611   CHOLHDL 3.0 12/28/2019 1501   VLDL 13 12/27/2017 1045   LDLCALC 61 12/28/2019 1501      Wt Readings from Last 3 Encounters:  12/29/20 215 lb 6.4 oz (97.7 kg)  10/21/20 215 lb 9.8 oz (97.8 kg)  10/03/20 221 lb 5.5 oz (100.4 kg)      ASSESSMENT AND PLAN:  1.  CAD   Pt with stent last July 2021   Doing well without CP   Contineu to follow  Stop ASA in July  COntinue Plavix  2  HTN   BP is fair  He says it is usu 130s  I would recomm, to simplify recomm 5 mg amlodipine and 100 mg Toprol  Stop diltiazem  Follow   If feels different , can resume as currently doing   3  HL   Will check lipdis   4   Pulm  CT as noted   Stable  5  Wt  Pt has lost wt  Following low carb diet   Watch sugars  Try TRE   Really does not appear to eat that much.       F/U in September     Current medicines are reviewed at length with the patient today.  The patient does not have concerns regarding medicines.  Signed, Dorris Carnes, MD  12/29/2020 10:24 AM     [facility] [place], [place], [place]  [address] Phone: [phone]; Fax: [phone]

## 2020-12-29 ENCOUNTER — Encounter: Payer: Self-pay | Admitting: Internal Medicine

## 2020-12-29 ENCOUNTER — Other Ambulatory Visit: Payer: Self-pay

## 2020-12-29 ENCOUNTER — Ambulatory Visit (INDEPENDENT_AMBULATORY_CARE_PROVIDER_SITE_OTHER): Payer: Medicare Other | Admitting: Internal Medicine

## 2020-12-29 VITALS — BP 138/80 | HR 73 | Ht 70.0 in | Wt 215.4 lb

## 2020-12-29 DIAGNOSIS — E782 Mixed hyperlipidemia: Secondary | ICD-10-CM

## 2020-12-29 DIAGNOSIS — Z79899 Other long term (current) drug therapy: Secondary | ICD-10-CM

## 2020-12-29 MED ORDER — AMLODIPINE BESYLATE 5 MG PO TABS
5.0000 mg | ORAL_TABLET | Freq: Every day | ORAL | 3 refills | Status: DC
Start: 2020-12-29 — End: 2021-02-13

## 2020-12-29 MED ORDER — METOPROLOL SUCCINATE ER 100 MG PO TB24
100.0000 mg | ORAL_TABLET | Freq: Every day | ORAL | 3 refills | Status: DC
Start: 2020-12-29 — End: 2022-01-23

## 2020-12-29 NOTE — Patient Instructions (Signed)
Medication Instructions:  Your physician has recommended you make the following change in your medication:   STOP Taking Cartia XT 240mg   Increase Amlodipine to 5 mg Daily  Increase Toprol XL to 100 mg Daily  STOP Taking Aspirin in July   *If you need a refill on your cardiac medications before your next appointment, please call your pharmacy*   Lab Work: Your physician recommends that you return for lab work in: Architectural technologist   If you have labs (blood work) drawn today and your tests are completely normal, you will receive your results only by: Marland Kitchen MyChart Message (if you have MyChart) OR . A paper copy in the mail If you have any lab test that is abnormal or we need to change your treatment, we will call you to review the results.   Testing/Procedures: NONE    Follow-Up: At [facility], you and your health needs are our priority.  As part of our continuing mission to provide you with exceptional heart care, we have created designated Provider Care Teams.  These Care Teams include your primary Cardiologist (physician) and Advanced Practice Providers (APPs -  Physician Assistants and Nurse Practitioners) who all work together to provide you with the care you need, when you need it.  We recommend signing up for the patient portal called "MyChart".  Sign up information is provided on this After Visit Summary.  MyChart is used to connect with patients for Virtual Visits (Telemedicine).  Patients are able to view lab/test results, encounter notes, upcoming appointments, etc.  Non-urgent messages can be sent to your provider as well.   To learn more about what you can do with MyChart, go to [email or web address].    Your next appointment:    In September   The format for your next appointment:   In Person  Provider:   Dorris Carnes, MD   Other Instructions Thank you for choosing [facility]!

## 2021-01-02 ENCOUNTER — Other Ambulatory Visit: Payer: Self-pay

## 2021-01-02 ENCOUNTER — Other Ambulatory Visit (HOSPITAL_COMMUNITY)
Admission: RE | Admit: 2021-01-02 | Discharge: 2021-01-02 | Disposition: A | Discharge status: Home / Self Care | Payer: Medicare Other | Source: Ambulatory Visit | Attending: Internal Medicine | Admitting: Internal Medicine

## 2021-01-02 DIAGNOSIS — Z79899 Other long term (current) drug therapy: Secondary | ICD-10-CM | POA: Insufficient documentation

## 2021-01-02 DIAGNOSIS — E782 Mixed hyperlipidemia: Secondary | ICD-10-CM | POA: Insufficient documentation

## 2021-01-02 LAB — CBC
HCT: 45.1 % (ref 39.0–52.0)
Hemoglobin: 14.8 g/dL (ref 13.0–17.0)
MCH: 30.8 pg (ref 26.0–34.0)
MCHC: 32.8 g/dL (ref 30.0–36.0)
MCV: 93.8 fL (ref 80.0–100.0)
Platelets: 386 10*3/uL (ref 150–400)
RBC: 4.81 MIL/uL (ref 4.22–5.81)
RDW: 14.7 % (ref 11.5–15.5)
WBC: 7.7 10*3/uL (ref 4.0–10.5)
nRBC: 0 % (ref 0.0–0.2)

## 2021-01-02 LAB — LIPID PANEL
Cholesterol: 120 mg/dL (ref 0–200)
HDL: 49 mg/dL (ref >40)
LDL Cholesterol: 49 mg/dL (ref 0–99)
Total CHOL/HDL Ratio: 2.4 RATIO
Triglycerides: 112 mg/dL (ref <150)
VLDL: 22 mg/dL (ref 0–40)

## 2021-01-16 ENCOUNTER — Other Ambulatory Visit: Payer: Self-pay | Admitting: Internal Medicine

## 2021-02-03 ENCOUNTER — Telehealth: Payer: Self-pay | Admitting: Internal Medicine

## 2021-02-03 DIAGNOSIS — R601 Generalized edema: Secondary | ICD-10-CM

## 2021-02-03 NOTE — Telephone Encounter (Signed)
I spoke with patient. He states he has had lower extremity swelling for the past 2 weeks. He has a treadmill now and walks a mile every day on it. He denies SOB but states his his feet, ankles and calves feel tight. His weight today was 210.4 lbs. He reports he has not taken his BP this week but he feels certain his BP is in normal limits.  Of note, patient is still taking Diltiazem 240 mg daily. Last office visit stated patient should stop Diltiazem, increase Amlodipine to 5 mg and increase Toprol to 100 mg daily. He states he wishes to stay on Diltiazem.    Please advise

## 2021-02-03 NOTE — Telephone Encounter (Signed)
Pt c/o swelling: STAT is pt has developed SOB within 24 hours  1) How much weight have you gained and in what time span? Couple pounds   2) If swelling, where is the swelling located? legs  Are you currently taking a fluid pill? no 3) Are you currently SOB? no  4) Do you have a log of your daily weights (if so, list)? No   5) Have you gained 3 pounds in a day or 5 pounds in a week? 2-3 lbs   6) Have you traveled recently? No   Patient states he has started retaining water when Dr Harrington Challenger changed his medication

## 2021-02-06 NOTE — Telephone Encounter (Signed)
Reviewed note WHen I saw the pt in March he did not have any swelling in legs I would recomm CBC, BMET, BNP  Limit salt

## 2021-02-07 NOTE — Telephone Encounter (Signed)
Gave patient Dr. Alan Ripper recommendations. Placed lab orders. Scheduled lab for 02/08/21. Patient in agreement and verbalized understanding.

## 2021-02-08 ENCOUNTER — Other Ambulatory Visit: Payer: Self-pay

## 2021-02-08 ENCOUNTER — Other Ambulatory Visit: Payer: Medicare Other | Admitting: *Deleted

## 2021-02-08 ENCOUNTER — Other Ambulatory Visit: Payer: Self-pay | Admitting: *Deleted

## 2021-02-08 DIAGNOSIS — Z79899 Other long term (current) drug therapy: Secondary | ICD-10-CM

## 2021-02-08 DIAGNOSIS — I10 Essential (primary) hypertension: Secondary | ICD-10-CM

## 2021-02-08 DIAGNOSIS — R0600 Dyspnea, unspecified: Secondary | ICD-10-CM | POA: Diagnosis not present

## 2021-02-08 LAB — CBC
Hematocrit: 42.8 % (ref 37.5–51.0)
Hemoglobin: 14.3 g/dL (ref 13.0–17.7)
MCH: 29.9 pg (ref 26.6–33.0)
MCHC: 33.4 g/dL (ref 31.5–35.7)
MCV: 90 fL (ref 79–97)
Platelets: 436 10*3/uL (ref 150–450)
RBC: 4.78 x10E6/uL (ref 4.14–5.80)
RDW: 13.8 % (ref 11.6–15.4)
WBC: 8.3 10*3/uL (ref 3.4–10.8)

## 2021-02-08 NOTE — Progress Notes (Signed)
Lab orders updated and fixed per Robert Bellow request.

## 2021-02-09 LAB — PRO B NATRIURETIC PEPTIDE: NT-Pro BNP: 104 pg/mL (ref 0–376)

## 2021-02-09 LAB — BASIC METABOLIC PANEL
BUN/Creatinine Ratio: 15 (ref 10–24)
BUN: 17 mg/dL (ref 8–27)
CO2: 22 mmol/L (ref 20–29)
Calcium: 9.3 mg/dL (ref 8.6–10.2)
Chloride: 103 mmol/L (ref 96–106)
Creatinine, Ser: 1.15 mg/dL (ref 0.76–1.27)
Glucose: 93 mg/dL (ref 65–99)
Potassium: 4.6 mmol/L (ref 3.5–5.2)
Sodium: 140 mmol/L (ref 134–144)
eGFR: 69 mL/min/{1.73_m2} (ref >59)

## 2021-02-13 ENCOUNTER — Telehealth: Payer: Self-pay | Admitting: *Deleted

## 2021-02-13 MED ORDER — AMLODIPINE BESYLATE 2.5 MG PO TABS
2.5000 mg | ORAL_TABLET | Freq: Every day | ORAL | 3 refills | Status: DC
Start: 2021-02-13 — End: 2022-06-12

## 2021-02-13 NOTE — Telephone Encounter (Signed)
Keep track of BP   GOal is 110 to 130/ With change in med dosing send response in a couple wks

## 2021-02-13 NOTE — Telephone Encounter (Signed)
Called pt w most recent lab results.   The patient reports that he self reduced his dose of amlodipine and Toprol XL in half 12 days ago.  Currently his weight is down >6 pounds and he has no further swelling.  No lasix/potassium needed at this time.  I adv that since its unlikely the Toprol was contributing he needs to go back to the 100 mg dose daily.  He will keep amlodipine at 2.5 mg for now and will monitor BPs.  Adv to aim for consistently < 140/90.    Note Per last telephone note pt is on diltiazem and amlodipine. He doesn't remember that he was to stop dilt.  He is aware we will call him back with recommendations once reviewed by Dr. Harrington Challenger.

## 2021-02-13 NOTE — Telephone Encounter (Signed)
-----   Message from Dorris Carnes V, MD sent at 02/11/2021 11:15 PM EDT ----- CBC is normal Fluid number is normal Electrolytes and kidney function are normal  Does he still have swelling in legs? Could try Lasix 40 mg with 10 KCL 1 to 2x per week Watch salt Check BMET in 2 months

## 2021-02-14 NOTE — Telephone Encounter (Signed)
Pt notified and voiced understanding that he will monitor BP and send a response in a couple of wks.

## 2021-03-15 ENCOUNTER — Ambulatory Visit: Payer: Medicare Other | Admitting: Internal Medicine

## 2021-03-31 ENCOUNTER — Telehealth: Payer: Self-pay | Admitting: Internal Medicine

## 2021-03-31 NOTE — Telephone Encounter (Signed)
Reviewed Dr Harrington Challenger' ov notes.  Pt is to continue Plavix as Rxed.  Her 12/29/2020 note does state he can d/c the ASA in July.  Raven at Dr Knute Neu office is aware and will notify the pt.

## 2021-03-31 NOTE — Telephone Encounter (Signed)
Raven from Dr. Knute Neu office is calling to check to see if pt is supposed to be currently taking Plavix 75 mg tablet? Please advise

## 2021-04-04 DIAGNOSIS — J449 Chronic obstructive pulmonary disease, unspecified: Secondary | ICD-10-CM | POA: Diagnosis not present

## 2021-04-04 DIAGNOSIS — I2510 Atherosclerotic heart disease of native coronary artery without angina pectoris: Secondary | ICD-10-CM | POA: Diagnosis not present

## 2021-04-04 DIAGNOSIS — I10 Essential (primary) hypertension: Secondary | ICD-10-CM | POA: Diagnosis not present

## 2021-04-06 DIAGNOSIS — J449 Chronic obstructive pulmonary disease, unspecified: Secondary | ICD-10-CM | POA: Diagnosis not present

## 2021-04-06 DIAGNOSIS — I10 Essential (primary) hypertension: Secondary | ICD-10-CM | POA: Diagnosis not present

## 2021-04-17 DIAGNOSIS — Z23 Encounter for immunization: Secondary | ICD-10-CM | POA: Diagnosis not present

## 2021-05-07 DIAGNOSIS — J449 Chronic obstructive pulmonary disease, unspecified: Secondary | ICD-10-CM | POA: Diagnosis not present

## 2021-05-07 DIAGNOSIS — I10 Essential (primary) hypertension: Secondary | ICD-10-CM | POA: Diagnosis not present

## 2021-06-01 DIAGNOSIS — J449 Chronic obstructive pulmonary disease, unspecified: Secondary | ICD-10-CM | POA: Diagnosis not present

## 2021-06-01 DIAGNOSIS — J441 Chronic obstructive pulmonary disease with (acute) exacerbation: Secondary | ICD-10-CM | POA: Diagnosis not present

## 2021-06-13 ENCOUNTER — Ambulatory Visit (INDEPENDENT_AMBULATORY_CARE_PROVIDER_SITE_OTHER): Payer: Medicare Other | Admitting: Internal Medicine

## 2021-06-13 ENCOUNTER — Other Ambulatory Visit: Payer: Self-pay

## 2021-06-13 ENCOUNTER — Encounter: Payer: Self-pay | Admitting: Internal Medicine

## 2021-06-13 ENCOUNTER — Encounter (INDEPENDENT_AMBULATORY_CARE_PROVIDER_SITE_OTHER): Payer: Self-pay

## 2021-06-13 VITALS — BP 144/82 | HR 73 | Ht 70.0 in | Wt 216.6 lb

## 2021-06-13 DIAGNOSIS — J40 Bronchitis, not specified as acute or chronic: Secondary | ICD-10-CM | POA: Diagnosis not present

## 2021-06-13 DIAGNOSIS — I10 Essential (primary) hypertension: Secondary | ICD-10-CM | POA: Diagnosis not present

## 2021-06-13 DIAGNOSIS — Z836 Family history of other diseases of the respiratory system: Secondary | ICD-10-CM | POA: Diagnosis not present

## 2021-06-13 NOTE — Patient Instructions (Signed)
Medication Instructions:  Your physician recommends that you continue on your current medications as directed. Please refer to the Current Medication list given to you today.  *If you need a refill on your cardiac medications before your next appointment, please call your pharmacy*   Lab Work: NONE   If you have labs (blood work) drawn today and your tests are completely normal, you will receive your results only by: [place] (if you have MyChart) OR A paper copy in the mail If you have any lab test that is abnormal or we need to change your treatment, we will call you to review the results.   Testing/Procedures: NONE    Follow-Up: At [facility], you and your health needs are our priority.  As part of our continuing mission to provide you with exceptional heart care, we have created designated Provider Care Teams.  These Care Teams include your primary Cardiologist (physician) and Advanced Practice Providers (APPs -  Physician Assistants and Nurse Practitioners) who all work together to provide you with the care you need, when you need it.  We recommend signing up for the patient portal called "MyChart".  Sign up information is provided on this After Visit Summary.  MyChart is used to connect with patients for Virtual Visits (Telemedicine).  Patients are able to view lab/test results, encounter notes, upcoming appointments, etc.  Non-urgent messages can be sent to your provider as well.   To learn more about what you can do with MyChart, go to [email or web address].    Your next appointment:    [place]   The format for your next appointment:   In Person  Provider:   Dorris Carnes, MD   Other Instructions Thank you for choosing [facility]!

## 2021-06-13 NOTE — Addendum Note (Signed)
Addended by: Levonne Hubert on: 06/13/2021 04:43 PM   Modules accepted: Orders

## 2021-06-13 NOTE — Progress Notes (Signed)
Cardiology Office Note   Date:  06/13/2021   ID:  Jeremy Lopez, DOB 17-Jun-1951, MRN [id number]  PCP:  Asencion Noble, MD  Cardiologist:   Dorris Carnes, MD    Patient presents for f/u of CAD    History of Present Illness: Jeremy Lopez is a 70 y.o. male with a history of  CAD   He is s/p PTCA/DES in 2010   Pt had a stress test in 2010  In June 2021 he complained of chest pressure   Myovue was done and showed no ischemia   The pt continued to have CP, giving out with activity, SOB   CT of chest showed radiation fibrosis    With continued worsened symptoms,  I recomm that he undergo L heart catheterizaoin    This was done 04/26/20.  This showed 99% mid RCA leasion  He underwent atherectomy with DES placment.   The pt also had a patent Prox LAD stent.   LVEF 55 to 65%  I saw the pt lasat spring   Since seen he has done OK from a cardiac standpoitn   Deneis CP    He did have bronchitis x 3 requiring 3 rounds of ABX   Feels he has not gotten back to his activlty level he had prior     BP at home  140/     Current Meds  Medication Sig   albuterol (VENTOLIN HFA) 108 (90 Base) MCG/ACT inhaler Inhale 2 puffs into the lungs every 6 (six) hours as needed for wheezing or shortness of breath.   Cholecalciferol (VITAMIN D) 50 MCG (2000 UT) tablet Take 2,000 Units by mouth daily.   clopidogrel (PLAVIX) 75 MG tablet Take 1 tablet (75 mg total) by mouth daily with breakfast.   diltiazem (CARDIZEM CD) 240 MG 24 hr capsule TAKE ONE CAPSULE (240 MG TOTAL) BY MOUTHDAILY.   fluticasone (FLONASE) 50 MCG/ACT nasal spray Place 2 sprays into both nostrils daily as needed for allergies or rhinitis.   metoprolol succinate (TOPROL-XL) 100 MG 24 hr tablet Take 1 tablet (100 mg total) by mouth daily. Take with or immediately following a meal.   nitroGLYCERIN (NITROSTAT) 0.4 MG SL tablet Place 1 tablet (0.4 mg total) under the tongue every 5 (five) minutes as needed for chest pain.   rosuvastatin (CRESTOR) 40 MG tablet  TAKE ONE (1) TABLET BY MOUTH EVERY DAY     Allergies:   Cephalexin   Past Medical History:  Diagnosis Date   CAD (coronary artery disease), native coronary artery    DES proximal LAD 07/2009; scattered 20-40% lesions; normal LVEF   Essential hypertension    Hyperlipidemia    Small cell lung cancer ([place])    Right upper lobe 2008; no evidence of disease since treatment ended in 11/2007    Past Surgical History:  Procedure Laterality Date   CORONARY ATHERECTOMY N/A 04/26/2020   Procedure: CORONARY ATHERECTOMY;  Surgeon: Jettie Booze, MD;  Location: [place] [facility];  Service: Cardiovascular;  Laterality: N/A;   CORONARY STENT INTERVENTION N/A 04/26/2020   Procedure: CORONARY STENT INTERVENTION;  Surgeon: Jettie Booze, MD;  Location: [place] [facility];  Service: Cardiovascular;  Laterality: N/A;   Insertion of left subclavian Port-A-Cath  2008   INTRAVASCULAR ULTRASOUND/IVUS N/A 04/26/2020   Procedure: Intravascular Ultrasound/IVUS;  Surgeon: Jettie Booze, MD;  Location: [place] [facility];  Service: Cardiovascular;  Laterality: N/A;   RIGHT/LEFT HEART CATH AND CORONARY ANGIOGRAPHY N/A 04/26/2020  Procedure: RIGHT/LEFT HEART CATH AND CORONARY ANGIOGRAPHY;  Surgeon: Jettie Booze, MD;  Location: [place] [facility];  Service: Cardiovascular;  Laterality: N/A;   TEMPORARY PACEMAKER N/A 04/26/2020   Procedure: TEMPORARY PACEMAKER;  Surgeon: Jettie Booze, MD;  Location: [place] [facility];  Service: Cardiovascular;  Laterality: N/A;   Video bronchoscopy and video mediastinoscopy  2008     Social History:  The patient  reports that he quit smoking about 22 years ago. His smoking use included cigarettes. He started smoking about 54 years ago. He has never used smokeless tobacco. He reports current alcohol use of about 14.0 standard drinks per week. He reports that he does not use drugs.   Family History:  The patient's family history includes CAD in his  brother; Cancer in his father; Hypertension in his brother.    ROS:  Please see the history of present illness. All other systems are reviewed and  Negative to the above problem except as noted.    PHYSICAL EXAM: VS:  BP (!) 144/82   Pulse 73   Ht 5\' 10"  (1.778 m)   Wt 98.2 kg   SpO2 97%   BMI 31.08 kg/m   GEN: Obese 70 yo  in no acute distress  HEENT: normal  Neck: no JVD, no carotid bruits Cardiac: RRR; no murmurs;  No signif LE edema  Respiratory:  CTA GI: soft, nontender, nondistended, + BS  No hepatomegaly  MS: no deformity Moving all extremities   Skin: warm and dry,  Sl erythema Neuro:  Strength and sensation are intact Psych: euthymic mood, full affect   EKG:  EKG shows  NSR   71 bpm    CARDIAC CATH   04/26/20   Mid RCA lesion is 99% stenosed, heavily calcified. Following atherectomy, A drug-eluting stent was successfully placed using a STENT RESOLUTE ONYX 3.5X15, postdilated to 3.75 mm; stent optimized with IVUS. Post intervention, there is a 0% residual stenosis. Mid RCA to Dist RCA lesion is 40% stenosed. Previously placed Prox LAD stent, is widely patent. The left ventricular systolic function is normal. LV end diastolic pressure is normal. The left ventricular ejection fraction is 55-65% by visual estimate. There is no aortic valve stenosis.   Continue aggressive secondary prevention.  Still eligible for same-day discharge.   Consider Plavix monotherapy given his diffuse disease after DAPT is completed.    Chest CT  04/21/20   No pulmonary emboli.   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   Aortic Atherosclerosis (ICD10-I70.0). Coronary artery calcification.  Lipid Panel    Component Value Date/Time   CHOL 120 01/02/2021 0935   CHOL 120 01/04/2016 1611   TRIG 112 01/02/2021 0935   HDL 49 01/02/2021 0935   HDL 47 01/04/2016 1611   CHOLHDL 2.4 01/02/2021 0935   VLDL 22 01/02/2021 0935   LDLCALC 49  01/02/2021 0935   LDLCALC 61 12/28/2019 1501      Wt Readings from Last 3 Encounters:  06/13/21 98.2 kg  12/29/20 97.7 kg  10/21/20 97.8 kg      ASSESSMENT AND PLAN:  1.  CAD   Pt with stent last July 2021   Doing well without CP   Contineu to follow  He is now just on Plavix   Continue to follow    2  HTN  BP is fair  COuld be better   I would like to get into pulmonary rehab   This would allow for more  readings   He is reluctant for med increase      3  HL   Keep on statin     4   Pulm  As noted  Set up for pulmonary rehab    5  Wt  Discussed diet , carbs, unprocessed foods       F/U in 6 months      Current medicines are reviewed at length with the patient today.  The patient does not have concerns regarding medicines.  Signed, Dorris Carnes, MD  06/13/2021 1:16 PM     [facility] [place], [place], [place]  [address] Phone: [phone]; Fax: [phone]

## 2021-06-16 NOTE — Addendum Note (Signed)
Addended by: Levonne Hubert on: 06/16/2021 12:55 PM   Modules accepted: Orders

## 2021-06-19 ENCOUNTER — Ambulatory Visit (HOSPITAL_COMMUNITY)
Admission: RE | Admit: 2021-06-19 | Discharge: 2021-06-19 | Disposition: A | Discharge status: Home / Self Care | Payer: Medicare Other | Source: Ambulatory Visit | Attending: Internal Medicine | Admitting: Internal Medicine

## 2021-06-19 ENCOUNTER — Other Ambulatory Visit (HOSPITAL_COMMUNITY): Payer: Self-pay | Admitting: Internal Medicine

## 2021-06-19 ENCOUNTER — Other Ambulatory Visit: Payer: Self-pay

## 2021-06-19 DIAGNOSIS — J441 Chronic obstructive pulmonary disease with (acute) exacerbation: Secondary | ICD-10-CM | POA: Diagnosis not present

## 2021-06-19 DIAGNOSIS — R059 Cough, unspecified: Secondary | ICD-10-CM | POA: Diagnosis not present

## 2021-07-07 DIAGNOSIS — J449 Chronic obstructive pulmonary disease, unspecified: Secondary | ICD-10-CM | POA: Diagnosis not present

## 2021-07-07 DIAGNOSIS — I10 Essential (primary) hypertension: Secondary | ICD-10-CM | POA: Diagnosis not present

## 2021-08-03 DIAGNOSIS — Z23 Encounter for immunization: Secondary | ICD-10-CM | POA: Diagnosis not present

## 2021-08-03 DIAGNOSIS — I781 Nevus, non-neoplastic: Secondary | ICD-10-CM | POA: Diagnosis not present

## 2021-08-03 DIAGNOSIS — J449 Chronic obstructive pulmonary disease, unspecified: Secondary | ICD-10-CM | POA: Diagnosis not present

## 2021-08-03 DIAGNOSIS — I700 Atherosclerosis of aorta: Secondary | ICD-10-CM | POA: Diagnosis not present

## 2021-08-03 DIAGNOSIS — I10 Essential (primary) hypertension: Secondary | ICD-10-CM | POA: Diagnosis not present

## 2021-08-07 DIAGNOSIS — J449 Chronic obstructive pulmonary disease, unspecified: Secondary | ICD-10-CM | POA: Diagnosis not present

## 2021-08-07 DIAGNOSIS — I10 Essential (primary) hypertension: Secondary | ICD-10-CM | POA: Diagnosis not present

## 2021-08-24 DIAGNOSIS — D224 Melanocytic nevi of scalp and neck: Secondary | ICD-10-CM | POA: Diagnosis not present

## 2021-08-24 DIAGNOSIS — L821 Other seborrheic keratosis: Secondary | ICD-10-CM | POA: Diagnosis not present

## 2021-08-24 DIAGNOSIS — D485 Neoplasm of uncertain behavior of skin: Secondary | ICD-10-CM | POA: Diagnosis not present

## 2021-09-06 DIAGNOSIS — J449 Chronic obstructive pulmonary disease, unspecified: Secondary | ICD-10-CM | POA: Diagnosis not present

## 2021-09-06 DIAGNOSIS — I10 Essential (primary) hypertension: Secondary | ICD-10-CM | POA: Diagnosis not present

## 2021-09-18 ENCOUNTER — Encounter (HOSPITAL_COMMUNITY): Payer: Medicare Other

## 2021-10-06 DIAGNOSIS — I10 Essential (primary) hypertension: Secondary | ICD-10-CM | POA: Diagnosis not present

## 2021-10-06 DIAGNOSIS — J449 Chronic obstructive pulmonary disease, unspecified: Secondary | ICD-10-CM | POA: Diagnosis not present

## 2021-11-05 DIAGNOSIS — J449 Chronic obstructive pulmonary disease, unspecified: Secondary | ICD-10-CM | POA: Diagnosis not present

## 2021-11-05 DIAGNOSIS — I10 Essential (primary) hypertension: Secondary | ICD-10-CM | POA: Diagnosis not present

## 2021-11-16 ENCOUNTER — Ambulatory Visit (INDEPENDENT_AMBULATORY_CARE_PROVIDER_SITE_OTHER): Payer: Medicare Other | Admitting: Pulmonary Disease

## 2021-11-16 ENCOUNTER — Other Ambulatory Visit: Payer: Self-pay

## 2021-11-16 ENCOUNTER — Encounter: Payer: Self-pay | Admitting: Pulmonary Disease

## 2021-11-16 VITALS — BP 138/82 | HR 77 | Temp 97.9°F | Ht 70.0 in | Wt 225.6 lb

## 2021-11-16 DIAGNOSIS — Z87891 Personal history of nicotine dependence: Secondary | ICD-10-CM | POA: Diagnosis not present

## 2021-11-16 DIAGNOSIS — J42 Unspecified chronic bronchitis: Secondary | ICD-10-CM | POA: Diagnosis not present

## 2021-11-16 DIAGNOSIS — R052 Subacute cough: Secondary | ICD-10-CM

## 2021-11-16 DIAGNOSIS — Z85118 Personal history of other malignant neoplasm of bronchus and lung: Secondary | ICD-10-CM

## 2021-11-16 DIAGNOSIS — R0981 Nasal congestion: Secondary | ICD-10-CM | POA: Diagnosis not present

## 2021-11-16 MED ORDER — TRELEGY ELLIPTA 100-62.5-25 MCG/ACT IN AEPB: 1.0000 | INHALATION_SPRAY | Freq: Every day | RESPIRATORY_TRACT | 0 refills | Status: DC

## 2021-11-16 NOTE — Patient Instructions (Addendum)
Thank you for visiting Dr. Valeta Harms at [facility]. Today we recommend the following:  Orders Placed This Encounter  Procedures   Pulmonary Function Test   Trelegy samples today  New prescription of trelegy   Return in about 6 weeks (around 12/28/2021) for with APP or Dr. Valeta Harms.    Please do your part to reduce the spread of COVID-19.

## 2021-11-16 NOTE — Addendum Note (Signed)
Addended by: Fran Lowes on: 11/16/2021 02:51 PM   Modules accepted: Orders

## 2021-11-16 NOTE — Progress Notes (Signed)
Synopsis: Referred in February 2023 for history of lung cancer by Asencion Noble, MD  Subjective:   PATIENT ID: Jeremy Lopez GENDER: male DOB: 1951/07/01, MRN: [id number]  Chief Complaint  Patient presents with   Consult    Consult.     This is a 71 year old gentleman, history of coronary disease, DES to LAD in 2010, essential hypertension, hyperlipidemia small cell lung cancer right upper lobe 2008.  Treatment ended in February 2009. For the past 10 years, and worse since this past September had recurrent bronchitis. Currently not really seeing any one regarding his lung ca. In September of 2022 had another episode of bronchitis treated with ABX. The congestion seems to be the biggest problem. He was treated with 3 separate rounds of abx. He has been using albuterol and Flonase. Former smoker, heavy smoker, quit at age 44,  starting smoking at age 16 years old.  His son has asthma and has been doing better on a maintenance inhaler.   Past Medical History:  Diagnosis Date   CAD (coronary artery disease), native coronary artery    DES proximal LAD 07/2009; scattered 20-40% lesions; normal LVEF   Essential hypertension    Hyperlipidemia    Small cell lung cancer ([place])    Right upper lobe 2008; no evidence of disease since treatment ended in 11/2007     Family History  Problem Relation Age of Onset   Cancer Father    CAD Brother    Hypertension Brother      Past Surgical History:  Procedure Laterality Date   CORONARY ATHERECTOMY N/A 04/26/2020   Procedure: CORONARY ATHERECTOMY;  Surgeon: Jettie Booze, MD;  Location: [facility];  Service: Cardiovascular;  Laterality: N/A;   CORONARY STENT INTERVENTION N/A 04/26/2020   Procedure: CORONARY STENT INTERVENTION;  Surgeon: Jettie Booze, MD;  Location: [facility];  Service: Cardiovascular;  Laterality: N/A;   Insertion of left subclavian Port-A-Cath  2008   INTRAVASCULAR ULTRASOUND/IVUS N/A 04/26/2020    Procedure: Intravascular Ultrasound/IVUS;  Surgeon: Jettie Booze, MD;  Location: [facility];  Service: Cardiovascular;  Laterality: N/A;   RIGHT/LEFT HEART CATH AND CORONARY ANGIOGRAPHY N/A 04/26/2020   Procedure: RIGHT/LEFT HEART CATH AND CORONARY ANGIOGRAPHY;  Surgeon: Jettie Booze, MD;  Location: [facility];  Service: Cardiovascular;  Laterality: N/A;   TEMPORARY PACEMAKER N/A 04/26/2020   Procedure: TEMPORARY PACEMAKER;  Surgeon: Jettie Booze, MD;  Location: [facility];  Service: Cardiovascular;  Laterality: N/A;   Video bronchoscopy and video mediastinoscopy  2008    Social History   Socioeconomic History   Marital status: Married    Spouse name: Not on file   Number of children: Not on file   Years of education: Not on file   Highest education level: Not on file  Occupational History   Occupation: Full time    Comment: Education officer, museum for [employer]  Tobacco Use   Smoking status: Former    Types: Cigarettes    Start date: 10/25/1966    Quit date: 10/25/1998    Years since quitting: 23.0   Smokeless tobacco: Never  Vaping Use   Vaping Use: Never used  Substance and Sexual Activity   Alcohol use: Yes    Alcohol/week: 14.0 standard drinks    Types: 14 Cans of beer per week   Drug use: No   Sexual activity: Not on file  Other Topics Concern   Not on file  Social  History Narrative   Not on file   Social Determinants of Health   Financial Resource Strain: Not on file  Food Insecurity: Not on file  Transportation Needs: Not on file  Physical Activity: Not on file  Stress: Not on file  Social Connections: Not on file  Intimate Partner Violence: Not on file     Allergies  Allergen Reactions   Cephalexin Anaphylaxis    throat swelling Patient said he can take amoxicillin     Outpatient Medications Prior to Visit  Medication Sig Dispense Refill   albuterol (VENTOLIN HFA) 108 (90 Base) MCG/ACT inhaler Inhale 2 puffs into the  lungs every 6 (six) hours as needed for wheezing or shortness of breath.     aspirin 81 MG tablet Take 81 mg by mouth daily.     Cholecalciferol (VITAMIN D) 50 MCG (2000 UT) tablet Take 2,000 Units by mouth daily.     clopidogrel (PLAVIX) 75 MG tablet Take 1 tablet (75 mg total) by mouth daily with breakfast. 90 tablet 3   diltiazem (CARDIZEM CD) 240 MG 24 hr capsule TAKE ONE CAPSULE (240 MG TOTAL) BY MOUTHDAILY. 90 capsule 3   fluticasone (FLONASE) 50 MCG/ACT nasal spray Place 2 sprays into both nostrils daily as needed for allergies or rhinitis.     rosuvastatin (CRESTOR) 40 MG tablet TAKE ONE (1) TABLET BY MOUTH EVERY DAY 90 tablet 3   amLODipine (NORVASC) 2.5 MG tablet Take 1 tablet (2.5 mg total) by mouth daily. (Patient not taking: Reported on 06/13/2021) 180 tablet 3   metoprolol succinate (TOPROL-XL) 100 MG 24 hr tablet Take 1 tablet (100 mg total) by mouth daily. Take with or immediately following a meal. 90 tablet 3   nitroGLYCERIN (NITROSTAT) 0.4 MG SL tablet Place 1 tablet (0.4 mg total) under the tongue every 5 (five) minutes as needed for chest pain. 25 tablet 2   Facility-Administered Medications Prior to Visit  Medication Dose Route Frequency Provider Last Rate Last Admin   regadenoson (LEXISCAN) injection SOLN 0.4 mg  0.4 mg Intravenous Once O'Neal, Cassie Freer, MD        Review of Systems  Constitutional:  Negative for chills, fever, malaise/fatigue and weight loss.  HENT:  Negative for hearing loss, sore throat and tinnitus.   Eyes:  Negative for blurred vision and double vision.  Respiratory:  Positive for cough, sputum production and shortness of breath. Negative for hemoptysis, wheezing and stridor.   Cardiovascular:  Negative for chest pain, palpitations, orthopnea, leg swelling and PND.  Gastrointestinal:  Negative for abdominal pain, constipation, diarrhea, heartburn, nausea and vomiting.  Genitourinary:  Negative for dysuria, hematuria and urgency.  Musculoskeletal:   Negative for joint pain and myalgias.  Skin:  Negative for itching and rash.  Neurological:  Negative for dizziness, tingling, weakness and headaches.  Endo/Heme/Allergies:  Negative for environmental allergies. Does not bruise/bleed easily.  Psychiatric/Behavioral:  Negative for depression. The patient is not nervous/anxious and does not have insomnia.   All other systems reviewed and are negative.   Objective:  Physical Exam Vitals reviewed.  Constitutional:      General: He is not in acute distress.    Appearance: He is well-developed. He is obese.  HENT:     Head: Normocephalic and atraumatic.  Eyes:     General: No scleral icterus.    Conjunctiva/sclera: Conjunctivae normal.     Pupils: Pupils are equal, round, and reactive to light.  Neck:     Vascular: No JVD.  Trachea: No tracheal deviation.  Cardiovascular:     Rate and Rhythm: Normal rate and regular rhythm.     Heart sounds: Normal heart sounds. No murmur heard. Pulmonary:     Effort: Pulmonary effort is normal. No tachypnea, accessory muscle usage or respiratory distress.     Breath sounds: No stridor. No wheezing, rhonchi or rales.  Abdominal:     General: There is no distension.     Palpations: Abdomen is soft.     Tenderness: There is no abdominal tenderness.  Musculoskeletal:        General: No tenderness.     Cervical back: Neck supple.  Lymphadenopathy:     Cervical: No cervical adenopathy.  Skin:    General: Skin is warm and dry.     Capillary Refill: Capillary refill takes less than 2 seconds.     Findings: No rash.  Neurological:     Mental Status: He is alert and oriented to person, place, and time.  Psychiatric:        Behavior: Behavior normal.     Vitals:   11/16/21 1416  BP: 138/82  Pulse: 77  Temp: 97.9 F (36.6 C)  TempSrc: Oral  SpO2: 97%  Weight: 225 lb 9.6 oz (102.3 kg)  Height: 5\' 10"  (1.778 m)   97% on RA BMI Readings from Last 3 Encounters:  11/16/21 32.37 kg/m   06/13/21 31.08 kg/m  12/29/20 30.91 kg/m   Wt Readings from Last 3 Encounters:  11/16/21 225 lb 9.6 oz (102.3 kg)  06/13/21 216 lb 9.6 oz (98.2 kg)  12/29/20 215 lb 6.4 oz (97.7 kg)     CBC    Component Value Date/Time   WBC 8.3 02/08/2021 1338   WBC 7.7 01/02/2021 0935   RBC 4.78 02/08/2021 1338   RBC 4.81 01/02/2021 0935   HGB 14.3 02/08/2021 1338   HGB 11.8 (L) 10/14/2007 1541   HCT 42.8 02/08/2021 1338   HCT 35.2 (L) 10/14/2007 1541   PLT 436 02/08/2021 1338   MCV 90 02/08/2021 1338   MCV 96.4 10/14/2007 1541   MCH 29.9 02/08/2021 1338   MCH 30.8 01/02/2021 0935   MCHC 33.4 02/08/2021 1338   MCHC 32.8 01/02/2021 0935   RDW 13.8 02/08/2021 1338   RDW 20.5 (H) 10/14/2007 1541   LYMPHSABS 1,632 04/22/2020 1046   LYMPHSABS 1.5 01/04/2016 1611   LYMPHSABS 0.1 (L) 10/14/2007 1541   MONOABS 0.8 01/31/2015 1620   MONOABS 0.2 10/14/2007 1541   EOSABS 131 04/22/2020 1046   EOSABS 0.1 01/04/2016 1611   BASOSABS 62 04/22/2020 1046   BASOSABS 0.1 01/04/2016 1611   BASOSABS 0.0 10/14/2007 1541     Chest Imaging: July 2021 CTA chest: No evidence of PE, paramediastinal fibrosis with history of radiation.  Evidence of centrilobular emphysema. The patient's images have been independently reviewed by me.     Pulmonary Functions Testing Results: No flowsheet data found.  FeNO:   Pathology:   Echocardiogram:   Heart Catheterization:     Assessment & Plan:     ICD-10-CM   1. History of lung cancer  Z85.118 Pulmonary Function Test   Small cell, 2008    2. Former smoker  Z87.891 Pulmonary Function Test    3. Subacute cough  R05.2     4. Nasal congestion  R09.81     5. Chronic bronchitis, unspecified chronic bronchitis type ([place])  J42       Discussion:  This is a 71 year old gentleman with what sounds  like recurrent chronic bronchitis.  He has the symptoms several times a year, last year in the fall 3 times requiring antibiotics.  He is a former smoker  quit 20+ years ago however has had annual CT imaging follow-up.  Not enrolled in lung cancer screening program.  He has this subtle subacute cough.  His chronic nasal issues have been treated with Flonase.  Plan: I think we could start him on Trelegy to see if this helps with the symptoms. He probably has COPD at least a mild form at baseline, chronic bronchitis based symptoms. Recommend continuation of his Flonase Can continue his albuterol as needed. He needs full PFTs. I would like to see him enrolled in lung cancer screening program but he has quit greater than 15 years ago.  Although he has had CT imaging completed almost every year or every other year for the past 8 to 10 years.  I think he would benefit from being enrolled in lung cancer screening program if we can get it set up. Especially with his history of previous small cell carcinoma.      Current Outpatient Medications:    albuterol (VENTOLIN HFA) 108 (90 Base) MCG/ACT inhaler, Inhale 2 puffs into the lungs every 6 (six) hours as needed for wheezing or shortness of breath., Disp: , Rfl:    aspirin 81 MG tablet, Take 81 mg by mouth daily., Disp: , Rfl:    Cholecalciferol (VITAMIN D) 50 MCG (2000 UT) tablet, Take 2,000 Units by mouth daily., Disp: , Rfl:    clopidogrel (PLAVIX) 75 MG tablet, Take 1 tablet (75 mg total) by mouth daily with breakfast., Disp: 90 tablet, Rfl: 3   diltiazem (CARDIZEM CD) 240 MG 24 hr capsule, TAKE ONE CAPSULE (240 MG TOTAL) BY MOUTHDAILY., Disp: 90 capsule, Rfl: 3   fluticasone (FLONASE) 50 MCG/ACT nasal spray, Place 2 sprays into both nostrils daily as needed for allergies or rhinitis., Disp: , Rfl:    rosuvastatin (CRESTOR) 40 MG tablet, TAKE ONE (1) TABLET BY MOUTH EVERY DAY, Disp: 90 tablet, Rfl: 3   amLODipine (NORVASC) 2.5 MG tablet, Take 1 tablet (2.5 mg total) by mouth daily. (Patient not taking: Reported on 06/13/2021), Disp: 180 tablet, Rfl: 3   metoprolol succinate (TOPROL-XL) 100 MG 24 hr  tablet, Take 1 tablet (100 mg total) by mouth daily. Take with or immediately following a meal., Disp: 90 tablet, Rfl: 3   nitroGLYCERIN (NITROSTAT) 0.4 MG SL tablet, Place 1 tablet (0.4 mg total) under the tongue every 5 (five) minutes as needed for chest pain., Disp: 25 tablet, Rfl: 2 No current facility-administered medications for this visit.  Facility-Administered Medications Ordered in Other Visits:    regadenoson (LEXISCAN) injection SOLN 0.4 mg, 0.4 mg, Intravenous, Once, O'Neal, Cassie Freer, MD   Garner Nash, DO [facility] 11/16/2021 2:29 PM

## 2021-12-05 DIAGNOSIS — I700 Atherosclerosis of aorta: Secondary | ICD-10-CM | POA: Diagnosis not present

## 2021-12-05 DIAGNOSIS — I10 Essential (primary) hypertension: Secondary | ICD-10-CM | POA: Diagnosis not present

## 2021-12-05 DIAGNOSIS — J440 Chronic obstructive pulmonary disease with acute lower respiratory infection: Secondary | ICD-10-CM | POA: Diagnosis not present

## 2021-12-05 DIAGNOSIS — I2510 Atherosclerotic heart disease of native coronary artery without angina pectoris: Secondary | ICD-10-CM | POA: Diagnosis not present

## 2021-12-05 DIAGNOSIS — J449 Chronic obstructive pulmonary disease, unspecified: Secondary | ICD-10-CM | POA: Diagnosis not present

## 2021-12-28 ENCOUNTER — Ambulatory Visit (INDEPENDENT_AMBULATORY_CARE_PROVIDER_SITE_OTHER): Payer: Medicare Other | Admitting: Nurse Practitioner

## 2021-12-28 ENCOUNTER — Other Ambulatory Visit: Payer: Self-pay

## 2021-12-28 ENCOUNTER — Encounter: Payer: Self-pay | Admitting: Nurse Practitioner

## 2021-12-28 VITALS — BP 132/78 | HR 65 | Temp 97.9°F | Ht 70.0 in | Wt 225.2 lb

## 2021-12-28 DIAGNOSIS — C3490 Malignant neoplasm of unspecified part of unspecified bronchus or lung: Secondary | ICD-10-CM | POA: Diagnosis not present

## 2021-12-28 DIAGNOSIS — J302 Other seasonal allergic rhinitis: Secondary | ICD-10-CM | POA: Diagnosis not present

## 2021-12-28 DIAGNOSIS — J42 Unspecified chronic bronchitis: Secondary | ICD-10-CM | POA: Diagnosis not present

## 2021-12-28 DIAGNOSIS — J410 Simple chronic bronchitis: Secondary | ICD-10-CM | POA: Diagnosis not present

## 2021-12-28 LAB — CBC WITH DIFFERENTIAL/PLATELET
Basophils Absolute: 0.1 10*3/uL (ref 0.0–0.1)
Basophils Relative: 0.6 % (ref 0.0–3.0)
Eosinophils Absolute: 0.1 10*3/uL (ref 0.0–0.7)
Eosinophils Relative: 1.4 % (ref 0.0–5.0)
HCT: 42.6 % (ref 39.0–52.0)
Hemoglobin: 14.2 g/dL (ref 13.0–17.0)
Lymphocytes Relative: 19.1 % (ref 12.0–46.0)
Lymphs Abs: 1.5 10*3/uL (ref 0.7–4.0)
MCHC: 33.3 g/dL (ref 30.0–36.0)
MCV: 92 fl (ref 78.0–100.0)
Monocytes Absolute: 0.7 10*3/uL (ref 0.1–1.0)
Monocytes Relative: 8.6 % (ref 3.0–12.0)
Neutro Abs: 5.7 10*3/uL (ref 1.4–7.7)
Neutrophils Relative %: 70.3 % (ref 43.0–77.0)
Platelets: 400 10*3/uL (ref 150.0–400.0)
RBC: 4.63 Mil/uL (ref 4.22–5.81)
RDW: 14.2 % (ref 11.5–15.5)
WBC: 8.1 10*3/uL (ref 4.0–10.5)

## 2021-12-28 LAB — POCT EXHALED NITRIC OXIDE: FeNO level (ppb): 15

## 2021-12-28 MED ORDER — LEVOCETIRIZINE DIHYDROCHLORIDE 5 MG PO TABS: 5.0000 mg | ORAL_TABLET | Freq: Every evening | ORAL | 5 refills | Status: DC

## 2021-12-28 MED ORDER — PREDNISONE 20 MG PO TABS: 40.0000 mg | ORAL_TABLET | Freq: Every day | ORAL | 0 refills | Status: AC

## 2021-12-28 NOTE — Progress Notes (Signed)
? ?@Patient  ID: Jeremy Lopez, male    DOB: [birth year], 71 y.o.   MRN: [id number] ? ?Chief Complaint  ?Patient presents with  ? Follow-up  ? ? ?Referring provider: ?Asencion Noble, MD ? ?HPI: ?71 year old male, former smoker followed for recurrent chronic bronchitis, DOE, and history of lung cancer. He is a patient of Dr. Juline Patch and last seen in office on 11/16/2021. Past medical history significant for HTN, CAD, HLD.  ? ?TEST/EVENTS:  ?04/21/2020 CTA chest: no PE. Atherosclerosis present. 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 No acute cardiopulmonary process.  ?04/26/2020 cardiac cath: mid RCA 99%, post interventional 0%. Mid RCA to dist RCA lesion 40%. LV function normal with EF 55-65% ? ?11/16/2021: OV with Dr. Valeta Harms for consult. Reported past 10 years, he has had problems with recurrent bronchitis. Not being followed by anyone for his small cell lung cancer (RUL), which he completed treatment for in February 2009. Suspected COPD component to symptoms. Started on Trelegy. PFTs ordered. Continued on flonase for rhinitis/allergy symptoms. Advised that he be enrolled in lung cancer screening program, given his lung cancer history, despite that he has quit >15 years ago.  ? ?12/28/2021: Today - acute visit ?Patient presents today for worsening dry cough and sneezing. He reports that his bronchitis normally starts this way and that he is trying to get ahead of it before it gets worse. He did take a z pack he had at home last week. Continues to have allergy symptoms including rhinitis, which is minimally controlled with flonase. Has not had great success with OTC antihistamines in past. DOE stable and unchanged. Denies any wheezing, fevers, lower extremity swelling. He continues on Trelegy daily, feels like it has helped some but he had to pay $500 for his first prescription. He was told it would only be $45 a month after that. He has not required use of his rescue inhaler.  ? ?FeNO 15  ppb  ? ?Allergies  ?Allergen Reactions  ? Cephalexin Anaphylaxis  ?  throat swelling ?Patient said he can take amoxicillin  ? ? ?Immunization History  ?Administered Date(s) Administered  ? Influenza Split 08/23/2014  ? Moderna Sars-Covid-2 Vaccination 10/09/2019, 11/09/2019  ? ? ?Past Medical History:  ?Diagnosis Date  ? CAD (coronary artery disease), native coronary artery   ? DES proximal LAD 07/2009; scattered 20-40% lesions; normal LVEF  ? Essential hypertension   ? Hyperlipidemia   ? Small cell lung cancer ([place])   ? Right upper lobe 2008; no evidence of disease since treatment ended in 11/2007  ? ? ?Tobacco History: ?Social History  ? ?Tobacco Use  ?Smoking Status Former  ? Types: Cigarettes  ? Start date: 10/25/1966  ? Quit date: 10/25/1998  ? Years since quitting: 23.1  ?Smokeless Tobacco Never  ? ?Counseling given: Not Answered ? ? ?Outpatient Medications Prior to Visit  ?Medication Sig Dispense Refill  ? albuterol (VENTOLIN HFA) 108 (90 Base) MCG/ACT inhaler Inhale 2 puffs into the lungs every 6 (six) hours as needed for wheezing or shortness of breath.    ? aspirin 81 MG tablet Take 81 mg by mouth daily.    ? Cholecalciferol (VITAMIN D) 50 MCG (2000 UT) tablet Take 2,000 Units by mouth daily.    ? clopidogrel (PLAVIX) 75 MG tablet Take 1 tablet (75 mg total) by mouth daily with breakfast. 90 tablet 3  ? diltiazem (CARDIZEM CD) 240 MG 24 hr capsule TAKE ONE CAPSULE (240 MG TOTAL) BY MOUTHDAILY. 90 capsule  3  ? fluticasone (FLONASE) 50 MCG/ACT nasal spray Place 2 sprays into both nostrils daily as needed for allergies or rhinitis.    ? Fluticasone-Umeclidin-Vilant (TRELEGY ELLIPTA) 100-62.5-25 MCG/ACT AEPB Inhale 1 puff into the lungs daily. 60 each 0  ? rosuvastatin (CRESTOR) 40 MG tablet TAKE ONE (1) TABLET BY MOUTH EVERY DAY 90 tablet 3  ? amLODipine (NORVASC) 2.5 MG tablet Take 1 tablet (2.5 mg total) by mouth daily. (Patient not taking: Reported on 06/13/2021) 180 tablet 3  ? metoprolol succinate (TOPROL-XL)  100 MG 24 hr tablet Take 1 tablet (100 mg total) by mouth daily. Take with or immediately following a meal. 90 tablet 3  ? nitroGLYCERIN (NITROSTAT) 0.4 MG SL tablet Place 1 tablet (0.4 mg total) under the tongue every 5 (five) minutes as needed for chest pain. 25 tablet 2  ? ?Facility-Administered Medications Prior to Visit  ?Medication Dose Route Frequency Provider Last Rate Last Admin  ? regadenoson (LEXISCAN) injection SOLN 0.4 mg  0.4 mg Intravenous Once O'Neal, Cassie Freer, MD      ? ? ? ?Review of Systems:  ? ?Constitutional: No weight loss or gain, night sweats, fevers, chills, fatigue, or lassitude. ?HEENT: No headaches, difficulty swallowing, tooth/dental problems, or sore throat. No itching, ear ache. +rhinitis, sneezing ?CV:  No chest pain, orthopnea, PND, swelling in lower extremities, anasarca, dizziness, palpitations, syncope ?Resp: +minimal shortness of breath with exertion (stable); dry cough. No excess mucus or change in color of mucus. No hemoptysis. No wheezing.  No chest wall deformity ?GI:  No heartburn, indigestion, abdominal pain, nausea, vomiting, diarrhea, change in bowel habits, loss of appetite, bloody stools.  ?Skin: No rash, lesions, ulcerations ?MSK:  No joint pain or swelling.  No decreased range of motion.  No back pain. ?Neuro: No dizziness or lightheadedness.  ?Psych: No depression or anxiety. Mood stable.  ? ? ? ?Physical Exam: ? ?BP 132/78 (BP Location: Right Arm, Cuff Size: Normal)   Pulse 65   Temp 97.9 ?F (36.6 ?C)   Ht 5\' 10"  (1.778 m)   Wt 225 lb 3.2 oz (102.2 kg)   SpO2 97%   BMI 32.31 kg/m?  ? ?GEN: Pleasant, interactive, well-appearing; obese; in no acute distress. ?HEENT:  Normocephalic and atraumatic. PERRLA. Sclera white. Nasal turbinates erythematous, moist and patent bilaterally. No rhinorrhea present. Oropharynx erythematous and moist, without exudate or edema. No lesions, ulcerations ?NECK:  Supple w/ fair ROM. No JVD present. Normal carotid impulses w/o  bruits. Thyroid symmetrical with no goiter or nodules palpated. No lymphadenopathy.   ?CV: RRR, no m/r/g, no peripheral edema. Pulses intact, +2 bilaterally. No cyanosis, pallor or clubbing. ?PULMONARY:  Unlabored, regular breathing. Minimal end expiratory wheeze bilaterally. No accessory muscle use. No dullness to percussion. ?GI: BS present and normoactive. Soft, non-tender to palpation.  ?Neuro: A/Ox3. No focal deficits noted.   ?Skin: Warm, no lesions or rashe ?Psych: Normal affect and behavior. Judgement and thought content appropriate.  ? ? ? ?Lab Results: ? ?CBC ?   ?Component Value Date/Time  ? WBC 8.1 12/28/2021 1451  ? RBC 4.63 12/28/2021 1451  ? HGB 14.2 12/28/2021 1451  ? HGB 14.3 02/08/2021 1338  ? HGB 11.8 (L) 10/14/2007 1541  ? HCT 42.6 12/28/2021 1451  ? HCT 42.8 02/08/2021 1338  ? HCT 35.2 (L) 10/14/2007 1541  ? PLT 400.0 12/28/2021 1451  ? PLT 436 02/08/2021 1338  ? MCV 92.0 12/28/2021 1451  ? MCV 90 02/08/2021 1338  ? MCV 96.4 10/14/2007 1541  ?  MCH 29.9 02/08/2021 1338  ? MCH 30.8 01/02/2021 0935  ? MCHC 33.3 12/28/2021 1451  ? RDW 14.2 12/28/2021 1451  ? RDW 13.8 02/08/2021 1338  ? RDW 20.5 (H) 10/14/2007 1541  ? LYMPHSABS 1.5 12/28/2021 1451  ? LYMPHSABS 1.5 01/04/2016 1611  ? LYMPHSABS 0.1 (L) 10/14/2007 1541  ? MONOABS 0.7 12/28/2021 1451  ? MONOABS 0.2 10/14/2007 1541  ? EOSABS 0.1 12/28/2021 1451  ? EOSABS 0.1 01/04/2016 1611  ? BASOSABS 0.1 12/28/2021 1451  ? BASOSABS 0.1 01/04/2016 1611  ? BASOSABS 0.0 10/14/2007 1541  ? ? ?BMET ?   ?Component Value Date/Time  ? NA 140 02/08/2021 1338  ? K 4.6 02/08/2021 1338  ? CL 103 02/08/2021 1338  ? CO2 22 02/08/2021 1338  ? GLUCOSE 93 02/08/2021 1338  ? GLUCOSE 104 (H) 04/19/2020 1348  ? BUN 17 02/08/2021 1338  ? CREATININE 1.15 02/08/2021 1338  ? CREATININE 1.21 12/28/2019 1501  ? CALCIUM 9.3 02/08/2021 1338  ? GFRNONAA >60 04/19/2020 1348  ? GFRAA >60 04/19/2020 1348  ? ? ?BNP ?No results found for: BNP ? ? ?Imaging: ? ?No results found. ? ? ? ?    ? View : No data to display.  ?  ?  ?  ? ? ?No results found for: NITRICOXIDE ? ? ? ? ? ?Assessment & Plan:  ? ?Simple chronic bronchitis ([place]) ?Mild exacerbation. Suspect also component of UACS r/t allerg

## 2021-12-28 NOTE — Assessment & Plan Note (Signed)
Hx of RUL lung cancer. Completed tx in 2009. Dr. Valeta Harms referred to lung cancer screening program at last visit.  ?

## 2021-12-28 NOTE — Assessment & Plan Note (Signed)
Mild exacerbation. Suspect also component of UACS r/t allergic rhinitis and postnasal drainage. Trigger prevention with Xyzal. Cbc with diff and allergen panel today. FeNO nl. Prednisone burst rx. Continue Trelegy for triple therapy regimen. PFTs that were ordered at last visit scheduled today.  ? ?Patient Instructions  ?Continue Trelegy 1 puff daily. Brush tongue and rinse mouth afterwards  ?Continue Albuterol inhaler 2 puffs every 6 hours as needed for shortness of breath or wheezing. Notify if symptoms persist despite rescue inhaler/neb use. ?Continue flonase 2 sprays each nostril daily  ? ?Start Xyzal 5 mg daily for allergies ?Prednisone 40 mg daily for 5 days. Take in AM with food.  ?Mucinex 600 mg Twice daily  ? ?Labs today - allergen panel/IgE and CBC with diff ? ?Follow up after pulmonary function testing with Dr. Valeta Harms or Joellen Jersey Ruben Pyka,NP. If symptoms do not improve or worsen, please contact office for sooner follow up or seek emergency care. ? ? ?

## 2021-12-28 NOTE — Assessment & Plan Note (Signed)
CBC with diff and allergen panel to assess for environmental triggers/eosinophilia. Xyzal advised and continue flonase. Will add singulair if eos elevated.  ?

## 2021-12-28 NOTE — Patient Instructions (Addendum)
Continue Trelegy 1 puff daily. Brush tongue and rinse mouth afterwards  ?Continue Albuterol inhaler 2 puffs every 6 hours as needed for shortness of breath or wheezing. Notify if symptoms persist despite rescue inhaler/neb use. ?Continue flonase 2 sprays each nostril daily  ? ?Start Xyzal 5 mg daily for allergies ?Prednisone 40 mg daily for 5 days. Take in AM with food.  ?Mucinex 600 mg Twice daily  ? ?Labs today - allergen panel/IgE and CBC with diff ? ?Follow up after pulmonary function testing with Dr. Valeta Harms or Joellen Jersey Miracle Criado,NP. If symptoms do not improve or worsen, please contact office for sooner follow up or seek emergency care. ?

## 2021-12-31 LAB — ALLERGEN PANEL (27) + IGE
Alternaria Alternata IgE: 0.11 kU/L — AB
Aspergillus Fumigatus IgE: 0.43 kU/L — AB
Bahia Grass IgE: <0.1 kU/L
Bermuda Grass IgE: <0.1 kU/L
Cat Dander IgE: <0.1 kU/L
Cedar, Mountain IgE: <0.1 kU/L
Cladosporium Herbarum IgE: 0.12 kU/L — AB
Cocklebur IgE: <0.1 kU/L
Cockroach, American IgE: <0.1 kU/L
Common Silver Birch IgE: <0.1 kU/L
D Farinae IgE: <0.1 kU/L
D Pteronyssinus IgE: <0.1 kU/L
Dog Dander IgE: <0.1 kU/L
Elm, American IgE: <0.1 kU/L
Hickory, White IgE: <0.1 kU/L
IgE (Immunoglobulin E), Serum: 48 IU/mL (ref 6–495)
Johnson Grass IgE: <0.1 kU/L
Kentucky Bluegrass IgE: <0.1 kU/L
Maple/Box Elder IgE: <0.1 kU/L
Mucor Racemosus IgE: 0.22 kU/L — AB
Oak, White IgE: <0.1 kU/L
Penicillium Chrysogen IgE: <0.1 kU/L
Pigweed, Rough IgE: <0.1 kU/L
Plantain, English IgE: <0.1 kU/L
Ragweed, Short IgE: <0.1 kU/L
Setomelanomma Rostrat: 0.35 kU/L — AB
Timothy Grass IgE: <0.1 kU/L
White Mulberry IgE: <0.1 kU/L

## 2022-01-05 DIAGNOSIS — J449 Chronic obstructive pulmonary disease, unspecified: Secondary | ICD-10-CM | POA: Diagnosis not present

## 2022-01-05 DIAGNOSIS — I10 Essential (primary) hypertension: Secondary | ICD-10-CM | POA: Diagnosis not present

## 2022-01-23 ENCOUNTER — Other Ambulatory Visit: Payer: Self-pay | Admitting: Internal Medicine

## 2022-01-23 ENCOUNTER — Ambulatory Visit (INDEPENDENT_AMBULATORY_CARE_PROVIDER_SITE_OTHER): Payer: Medicare Other | Admitting: Nurse Practitioner

## 2022-01-23 ENCOUNTER — Ambulatory Visit (INDEPENDENT_AMBULATORY_CARE_PROVIDER_SITE_OTHER): Payer: Medicare Other | Admitting: Pulmonary Disease

## 2022-01-23 ENCOUNTER — Encounter: Payer: Self-pay | Admitting: Nurse Practitioner

## 2022-01-23 DIAGNOSIS — Z85118 Personal history of other malignant neoplasm of bronchus and lung: Secondary | ICD-10-CM | POA: Diagnosis not present

## 2022-01-23 DIAGNOSIS — R058 Other specified cough: Secondary | ICD-10-CM | POA: Insufficient documentation

## 2022-01-23 DIAGNOSIS — Z87891 Personal history of nicotine dependence: Secondary | ICD-10-CM

## 2022-01-23 DIAGNOSIS — J4530 Mild persistent asthma, uncomplicated: Secondary | ICD-10-CM

## 2022-01-23 DIAGNOSIS — C3490 Malignant neoplasm of unspecified part of unspecified bronchus or lung: Secondary | ICD-10-CM

## 2022-01-23 DIAGNOSIS — J309 Allergic rhinitis, unspecified: Secondary | ICD-10-CM | POA: Insufficient documentation

## 2022-01-23 DIAGNOSIS — J3089 Other allergic rhinitis: Secondary | ICD-10-CM

## 2022-01-23 LAB — PULMONARY FUNCTION TEST
DL/VA % pred: 83 %
DL/VA: 3.37 ml/min/mmHg/L
DLCO cor % pred: 75 %
DLCO cor: 19.52 ml/min/mmHg
DLCO unc % pred: 75 %
DLCO unc: 19.52 ml/min/mmHg
FEF 25-75 Post: 2.18 L/sec
FEF 25-75 Pre: 1.75 L/sec
FEF2575-%Change-Post: 24 %
FEF2575-%Pred-Post: 88 %
FEF2575-%Pred-Pre: 70 %
FEV1-%Change-Post: 5 %
FEV1-%Pred-Post: 88 %
FEV1-%Pred-Pre: 83 %
FEV1-Post: 2.87 L
FEV1-Pre: 2.73 L
FEV1FVC-%Change-Post: 2 %
FEV1FVC-%Pred-Pre: 95 %
FEV6-%Change-Post: 2 %
FEV6-%Pred-Post: 94 %
FEV6-%Pred-Pre: 92 %
FEV6-Post: 3.95 L
FEV6-Pre: 3.87 L
FEV6FVC-%Change-Post: 0 %
FEV6FVC-%Pred-Post: 105 %
FEV6FVC-%Pred-Pre: 105 %
FVC-%Change-Post: 2 %
FVC-%Pred-Post: 89 %
FVC-%Pred-Pre: 87 %
FVC-Post: 3.96 L
FVC-Pre: 3.87 L
Post FEV1/FVC ratio: 73 %
Post FEV6/FVC ratio: 100 %
Pre FEV1/FVC ratio: 70 %
Pre FEV6/FVC Ratio: 100 %
RV % pred: 103 %
RV: 2.54 L
TLC % pred: 89 %
TLC: 6.3 L

## 2022-01-23 MED ORDER — BENZONATATE 200 MG PO CAPS: 200.0000 mg | ORAL_CAPSULE | Freq: Three times a day (TID) | ORAL | 1 refills | Status: DC | PRN

## 2022-01-23 MED ORDER — PANTOPRAZOLE SODIUM 40 MG PO TBEC: 40.0000 mg | DELAYED_RELEASE_TABLET | Freq: Every day | ORAL | 5 refills | Status: DC

## 2022-01-23 MED ORDER — SPACER/AERO-HOLDING CHAMBERS DEVI: 2 refills | Status: DC

## 2022-01-23 MED ORDER — BREZTRI AEROSPHERE 160-9-4.8 MCG/ACT IN AERO: 2.0000 | INHALATION_SPRAY | Freq: Two times a day (BID) | RESPIRATORY_TRACT | 0 refills | Status: DC

## 2022-01-23 NOTE — Assessment & Plan Note (Signed)
Improvement with Xyzal. Positive RAST to four different triggers. May consider singulair and possible allergy referral if symptoms persist. ?

## 2022-01-23 NOTE — Progress Notes (Signed)
Full PFT performed today. °

## 2022-01-23 NOTE — Assessment & Plan Note (Addendum)
Suspect there is an upper airway irritation component to cough as well. Continue trigger prevention and postnasal drainage control. Target cough control and GERD.  ?

## 2022-01-23 NOTE — Assessment & Plan Note (Signed)
Hx of RUL lung cancer. Completed tx in 2009. Stable on most recent imaging. Dr. Valeta Harms referred to lung cancer screening program at last visit. ?

## 2022-01-23 NOTE — Patient Instructions (Addendum)
Stop Trelegy. Start Breztri 2 puffs Twice daily. Brush tongue and rinse mouth well afterwards.  ?Continue Albuterol inhaler 2 puffs every 6 hours as needed for shortness of breath or wheezing. Notify if symptoms persist despite rescue inhaler/neb use. ?Continue flonase 2 sprays each nostril daily  ?Continue Xyzal 5 mg daily for allergies ?Continue Mucinex 600 mg Twice daily for chest congestion ? ?Delsym 2 tsp Twice daily for cough ?Benzonatate 1 capsule Three times a day for cough ?Chlorpheniramine 4 mg tab At bedtime for cough  ?Protonix 40 mg daily for reflux  ? ?Frequent sips of water. Suck on sugar free hard candies. Avoid throat clearing.  ?  ?Follow up in 3 months with Dr. Valeta Harms. If symptoms do not improve or worsen, please contact office for sooner follow up or seek emergency care. ?

## 2022-01-23 NOTE — Patient Instructions (Signed)
Full PFT performed today. °

## 2022-01-23 NOTE — Progress Notes (Deleted)
? ?@Patient  ID: Jeremy Lopez, male    DOB: 1951/06/25, 71 y.o.   MRN: [id number] ? ?Chief Complaint  ?Patient presents with  ? Follow-up  ?  PFT results ?Had bronchitis 2wks all finished with antibiotics  ? ? ?Referring provider: ?Asencion Noble, MD ? ?HPI: ?71 year old male, former smoker followed for chronic bronchitis, DOE, and history of lung cancer. He is patient of Dr. Juline Patch and last seen in office on 12/28/2021 by Bodin Gorka,NP. Past medical history significant for HTN, CAD, HLD.  ? ?TEST/EVENTS:  ? ?Allergies  ?Allergen Reactions  ? Cephalexin Anaphylaxis  ?  throat swelling ?Patient said he can take amoxicillin  ? ? ?Immunization History  ?Administered Date(s) Administered  ? Influenza Split 08/23/2014  ? Moderna Sars-Covid-2 Vaccination 10/09/2019, 11/09/2019  ? ? ?Past Medical History:  ?Diagnosis Date  ? CAD (coronary artery disease), native coronary artery   ? DES proximal LAD 07/2009; scattered 20-40% lesions; normal LVEF  ? Essential hypertension   ? Hyperlipidemia   ? Small cell lung cancer ([place])   ? Right upper lobe 2008; no evidence of disease since treatment ended in 11/2007  ? ? ?Tobacco History: ?Social History  ? ?Tobacco Use  ?Smoking Status Former  ? Types: Cigarettes  ? Start date: 10/25/1966  ? Quit date: 10/25/1998  ? Years since quitting: 23.2  ?Smokeless Tobacco Never  ? ?Counseling given: Not Answered ? ? ?Outpatient Medications Prior to Visit  ?Medication Sig Dispense Refill  ? albuterol (VENTOLIN HFA) 108 (90 Base) MCG/ACT inhaler Inhale 2 puffs into the lungs every 6 (six) hours as needed for wheezing or shortness of breath.    ? aspirin 81 MG tablet Take 81 mg by mouth daily.    ? Cholecalciferol (VITAMIN D) 50 MCG (2000 UT) tablet Take 2,000 Units by mouth daily.    ? clopidogrel (PLAVIX) 75 MG tablet Take 1 tablet (75 mg total) by mouth daily with breakfast. 90 tablet 3  ? diltiazem (CARDIZEM CD) 240 MG 24 hr capsule TAKE ONE CAPSULE (240 MG TOTAL) BY MOUTHDAILY. 30 capsule 0  ? fluticasone  (FLONASE) 50 MCG/ACT nasal spray Place 2 sprays into both nostrils daily as needed for allergies or rhinitis.    ? levocetirizine (XYZAL) 5 MG tablet Take 1 tablet (5 mg total) by mouth every evening. 30 tablet 5  ? metoprolol succinate (TOPROL-XL) 100 MG 24 hr tablet TAKE ONE TABLET (100MG  TOTAL) BY MOUTH DAILY. TAKE WITH OR IMMEDIATELY FOLLOWING A MEAL. 30 tablet 0  ? rosuvastatin (CRESTOR) 40 MG tablet TAKE ONE (1) TABLET BY MOUTH EVERY DAY 90 tablet 3  ? amLODipine (NORVASC) 2.5 MG tablet Take 1 tablet (2.5 mg total) by mouth daily. 180 tablet 3  ? Fluticasone-Umeclidin-Vilant (TRELEGY ELLIPTA) 100-62.5-25 MCG/ACT AEPB Inhale 1 puff into the lungs daily. (Patient not taking: Reported on 01/23/2022) 60 each 0  ? nitroGLYCERIN (NITROSTAT) 0.4 MG SL tablet Place 1 tablet (0.4 mg total) under the tongue every 5 (five) minutes as needed for chest pain. 25 tablet 2  ? ?Facility-Administered Medications Prior to Visit  ?Medication Dose Route Frequency Provider Last Rate Last Admin  ? regadenoson (LEXISCAN) injection SOLN 0.4 mg  0.4 mg Intravenous Once O'Neal, Cassie Freer, MD      ? ? ? ?Review of Systems:  ? ?Constitutional: No weight loss or gain, night sweats, fevers, chills, fatigue, or lassitude. ?HEENT: No headaches, difficulty swallowing, tooth/dental problems, or sore throat. No sneezing, itching, ear ache, nasal congestion, or post nasal drip ?  CV:  No chest pain, orthopnea, PND, swelling in lower extremities, anasarca, dizziness, palpitations, syncope ?Resp: No shortness of breath with exertion or at rest. No excess mucus or change in color of mucus. No productive or non-productive. No hemoptysis. No wheezing.  No chest wall deformity ?GI:  No heartburn, indigestion, abdominal pain, nausea, vomiting, diarrhea, change in bowel habits, loss of appetite, bloody stools.  ?GU: No dysuria, change in color of urine, urgency or frequency.  No flank pain, no hematuria  ?Skin: No rash, lesions, ulcerations ?MSK:  No  joint pain or swelling.  No decreased range of motion.  No back pain. ?Neuro: No dizziness or lightheadedness.  ?Psych: No depression or anxiety. Mood stable.  ? ? ? ?Physical Exam: ? ?BP (!) 142/76 (BP Location: Left Arm, Cuff Size: Large)   Pulse 75   Ht 5\' 10"  (1.778 m)   Wt 224 lb (101.6 kg)   SpO2 95%   BMI 32.14 kg/m?  ? ?GEN: Pleasant, interactive, well-nourished/chronically-ill appearing/acutely-ill appearing/poorly-nourished/morbidly obese; in no acute distress.****** ?HEENT:  Normocephalic and atraumatic. EACs patent bilaterally. TM pearly gray with present light reflex bilaterally. PERRLA. Sclera white. Nasal turbinates pink, moist and patent bilaterally. No rhinorrhea present. Oropharynx pink and moist, without exudate or edema. No lesions, ulcerations, or postnasal drip.  ?NECK:  Supple w/ fair ROM. No JVD present. Normal carotid impulses w/o bruits. Thyroid symmetrical with no goiter or nodules palpated. No lymphadenopathy.   ?CV: RRR, no m/r/g, no peripheral edema. Pulses intact, +2 bilaterally. No cyanosis, pallor or clubbing. ?PULMONARY:  Unlabored, regular breathing. Clear bilaterally A&P w/o wheezes/rales/rhonchi. No accessory muscle use. No dullness to percussion. ?GI: BS present and normoactive. Soft, non-tender to palpation. No organomegaly or masses detected. No CVA tenderness. ?MSK: No erythema, warmth or tenderness. Cap refil <2 sec all extrem. No deformities or joint swelling noted.  ?Neuro: A/Ox3. No focal deficits noted.   ?Skin: Warm, no lesions or rashe ?Psych: Normal affect and behavior. Judgement and thought content appropriate.  ? ? ? ?Lab Results: ? ?CBC ?   ?Component Value Date/Time  ? WBC 8.1 12/28/2021 1451  ? RBC 4.63 12/28/2021 1451  ? HGB 14.2 12/28/2021 1451  ? HGB 14.3 02/08/2021 1338  ? HGB 11.8 (L) 10/14/2007 1541  ? HCT 42.6 12/28/2021 1451  ? HCT 42.8 02/08/2021 1338  ? HCT 35.2 (L) 10/14/2007 1541  ? PLT 400.0 12/28/2021 1451  ? PLT 436 02/08/2021 1338  ? MCV 92.0  12/28/2021 1451  ? MCV 90 02/08/2021 1338  ? MCV 96.4 10/14/2007 1541  ? MCH 29.9 02/08/2021 1338  ? MCH 30.8 01/02/2021 0935  ? MCHC 33.3 12/28/2021 1451  ? RDW 14.2 12/28/2021 1451  ? RDW 13.8 02/08/2021 1338  ? RDW 20.5 (H) 10/14/2007 1541  ? LYMPHSABS 1.5 12/28/2021 1451  ? LYMPHSABS 1.5 01/04/2016 1611  ? LYMPHSABS 0.1 (L) 10/14/2007 1541  ? MONOABS 0.7 12/28/2021 1451  ? MONOABS 0.2 10/14/2007 1541  ? EOSABS 0.1 12/28/2021 1451  ? EOSABS 0.1 01/04/2016 1611  ? BASOSABS 0.1 12/28/2021 1451  ? BASOSABS 0.1 01/04/2016 1611  ? BASOSABS 0.0 10/14/2007 1541  ? ? ?BMET ?   ?Component Value Date/Time  ? NA 140 02/08/2021 1338  ? K 4.6 02/08/2021 1338  ? CL 103 02/08/2021 1338  ? CO2 22 02/08/2021 1338  ? GLUCOSE 93 02/08/2021 1338  ? GLUCOSE 104 (H) 04/19/2020 1348  ? BUN 17 02/08/2021 1338  ? CREATININE 1.15 02/08/2021 1338  ? CREATININE 1.21 12/28/2019 1501  ?  CALCIUM 9.3 02/08/2021 1338  ? GFRNONAA >60 04/19/2020 1348  ? GFRAA >60 04/19/2020 1348  ? ? ?BNP ?No results found for: BNP ? ? ?Imaging: ? ?No results found. ? ? ? ? ?  Latest Ref Rng & Units 01/23/2022  ?  9:52 AM  ?PFT Results  ?FVC-Pre L 3.87  P  ?FVC-Predicted Pre % 87  P  ?FVC-Post L 3.96  P  ?FVC-Predicted Post % 89  P  ?Pre FEV1/FVC % % 70  P  ?Post FEV1/FCV % % 73  P  ?FEV1-Pre L 2.73  P  ?FEV1-Predicted Pre % 83  P  ?FEV1-Post L 2.87  P  ?DLCO uncorrected ml/min/mmHg 19.52  P  ?DLCO UNC% % 75  P  ?DLCO corrected ml/min/mmHg 19.52  P  ?DLCO COR %Predicted % 75  P  ?DLVA Predicted % 83  P  ?TLC L 6.30  P  ?TLC % Predicted % 89  P  ?RV % Predicted % 103  P  ?  ?P Preliminary result  ? ? ?No results found for: NITRICOXIDE ? ? ? ? ? ?Assessment & Plan:  ? ?No problem-specific Assessment & Plan notes found for this encounter. ? ? ?I spent *** minutes of dedicated to the care of this patient on the date of this encounter to include pre-visit review of records, face-to-face time with the patient discussing conditions above, post visit ordering of testing,  clinical documentation with the electronic health record, making appropriate referrals as documented, and communicating necessary findings to members of the patients care team. ? ?Clayton Bibles, NP ?4/18/

## 2022-01-23 NOTE — Assessment & Plan Note (Addendum)
SOB overall stable. Chronic bronchitic cough and occasional wheezing. Significant midflow reversibility on PFTs today. Symptoms do seem consistent with asthma and he has good response to prednisone. DPI exacerbated cough. Will trial with Breztri Twice daily and spacer. Slight elevation in eosinophils (0.1). May consider singulair moving forward.  ? ?Patient Instructions  ?Stop Trelegy. Start Breztri 2 puffs Twice daily. Brush tongue and rinse mouth well afterwards.  ?Continue Albuterol inhaler 2 puffs every 6 hours as needed for shortness of breath or wheezing. Notify if symptoms persist despite rescue inhaler/neb use. ?Continue flonase 2 sprays each nostril daily  ?Continue Xyzal 5 mg daily for allergies ?Continue Mucinex 600 mg Twice daily for chest congestion ? ?Delsym 2 tsp Twice daily for cough ?Benzonatate 1 capsule Three times a day for cough ?Chlorpheniramine 4 mg tab At bedtime for cough  ?Protonix 40 mg daily for reflux  ? ?Frequent sips of water. Suck on sugar free hard candies. Avoid throat clearing.  ?  ?Follow up in 3 months with Dr. Valeta Harms. If symptoms do not improve or worsen, please contact office for sooner follow up or seek emergency care. ? ? ?

## 2022-01-23 NOTE — Progress Notes (Signed)
? ?@Patient  ID: Jeremy Lopez, male    DOB: 1951/09/14, 71 y.o.   MRN: [id number] ? ?Chief Complaint  ?Patient presents with  ? Follow-up  ?  PFT results ?Had bronchitis 2wks all finished with antibiotics  ? ? ?Referring provider: ?Asencion Noble, MD ? ?HPI: ?71 year old male, former smoker followed for recurrent chronic bronchitis, DOE, and history of lung cancer. He is a patient of Dr. Juline Patch and last seen in office on 11/16/2021. Past medical history significant for HTN, CAD, HLD.  ? ?TEST/EVENTS:  ?04/21/2020 CTA chest: no PE. Atherosclerosis present. 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 No acute cardiopulmonary process.  ?04/26/2020 cardiac cath: mid RCA 99%, post interventional 0%. Mid RCA to dist RCA lesion 40%. LV function normal with EF 55-65% ?12/28/2021: Eos 0.1. RAST positive for cladosporium, aspergillus, mucor, alternaria alternata  ?01/23/2022 PFTs: FVC 89, FEV1 88, ratio 73, TLC 89, DLCO 75. Normal lung function and volumes. Mild diffusion defect. No significant BD in FEV1 but does have a 24% change in midflow  ? ?11/16/2021: OV with Dr. Valeta Harms for consult. Reported past 10 years, he has had problems with recurrent bronchitis. Not being followed by anyone for his small cell lung cancer ([facility]), which he completed treatment for in February 2009. Suspected COPD component to symptoms. Started on Trelegy. PFTs ordered. Continued on flonase for rhinitis/allergy symptoms. Advised that he be enrolled in lung cancer screening program, given his lung cancer history, despite that he has quit >15 years ago.  ? ?12/28/2021: OV with Nasreen Goedecke,NP. Patient presents today for worsening dry cough and sneezing. He reports that his bronchitis normally starts this way and that he is trying to get ahead of it before it gets worse. He did take a z pack he had at home last week. Continues to have allergy symptoms including rhinitis, which is minimally controlled with flonase. Has not had great  success with OTC antihistamines in past. DOE stable and unchanged. FeNO nl. Treated with prednisone burst. Stepped up to Xyzal for allergic rhinits. PFTs ordered for further evaluation.  ? ?01/23/2022: Today - follow up ?Patient presents today with wife for follow up after PFTs. His son is also on the phone during the visit. He reports feeling better since I saw him last. Feels like the Xyzal has helped his rhinitis symptoms. He does continue to have a dry cough. No worsening in DOE; feels like it's at baseline. Notices an occasional wheeze. He mostly is limited by his cough. Feels like it limits his activity. He feels as though he is definitely triggered by environmental factors. Denies fevers, hemoptysis, weight loss or lower extremity swelling. He was previously on Trelegy but felt like it exacerbated his cough. Rarely uses albuterol. Continues on Xyzal and flonase ? ?Pulmonary function testing today showed overall normal lung function and volumes. There was not a significant change in my FEV1 (5%) but he did have significant midflow reversibility.  ? ?Allergies  ?Allergen Reactions  ? Cephalexin Anaphylaxis  ?  throat swelling ?Patient said he can take amoxicillin  ? ? ?Immunization History  ?Administered Date(s) Administered  ? Influenza Split 08/23/2014  ? Moderna Sars-Covid-2 Vaccination 10/09/2019, 11/09/2019  ? ? ?Past Medical History:  ?Diagnosis Date  ? CAD (coronary artery disease), native coronary artery   ? DES proximal LAD 07/2009; scattered 20-40% lesions; normal LVEF  ? Essential hypertension   ? Hyperlipidemia   ? Small cell lung cancer [place])   ? Right upper lobe 2008;  no evidence of disease since treatment ended in 11/2007  ? ? ?Tobacco History: ?Social History  ? ?Tobacco Use  ?Smoking Status Former  ? Types: Cigarettes  ? Start date: 10/25/1966  ? Quit date: 10/25/1998  ? Years since quitting: 23.2  ?Smokeless Tobacco Never  ? ?Counseling given: Not Answered ? ? ?Outpatient Medications Prior to  Visit  ?Medication Sig Dispense Refill  ? albuterol (VENTOLIN HFA) 108 (90 Base) MCG/ACT inhaler Inhale 2 puffs into the lungs every 6 (six) hours as needed for wheezing or shortness of breath.    ? aspirin 81 MG tablet Take 81 mg by mouth daily.    ? Cholecalciferol (VITAMIN D) 50 MCG (2000 UT) tablet Take 2,000 Units by mouth daily.    ? clopidogrel (PLAVIX) 75 MG tablet Take 1 tablet (75 mg total) by mouth daily with breakfast. 90 tablet 3  ? diltiazem (CARDIZEM CD) 240 MG 24 hr capsule TAKE ONE CAPSULE (240 MG TOTAL) BY MOUTHDAILY. 30 capsule 0  ? fluticasone (FLONASE) 50 MCG/ACT nasal spray Place 2 sprays into both nostrils daily as needed for allergies or rhinitis.    ? levocetirizine (XYZAL) 5 MG tablet Take 1 tablet (5 mg total) by mouth every evening. 30 tablet 5  ? metoprolol succinate (TOPROL-XL) 100 MG 24 hr tablet TAKE ONE TABLET (100MG  TOTAL) BY MOUTH DAILY. TAKE WITH OR IMMEDIATELY FOLLOWING A MEAL. 30 tablet 0  ? rosuvastatin (CRESTOR) 40 MG tablet TAKE ONE (1) TABLET BY MOUTH EVERY DAY 90 tablet 3  ? amLODipine (NORVASC) 2.5 MG tablet Take 1 tablet (2.5 mg total) by mouth daily. 180 tablet 3  ? Fluticasone-Umeclidin-Vilant (TRELEGY ELLIPTA) 100-62.5-25 MCG/ACT AEPB Inhale 1 puff into the lungs daily. (Patient not taking: Reported on 01/23/2022) 60 each 0  ? nitroGLYCERIN (NITROSTAT) 0.4 MG SL tablet Place 1 tablet (0.4 mg total) under the tongue every 5 (five) minutes as needed for chest pain. 25 tablet 2  ? ?Facility-Administered Medications Prior to Visit  ?Medication Dose Route Frequency Provider Last Rate Last Admin  ? regadenoson (LEXISCAN) injection SOLN 0.4 mg  0.4 mg Intravenous Once O'Neal, Cassie Freer, MD      ? ? ? ?Review of Systems:  ? ?Constitutional: No weight loss or gain, night sweats, fevers, chills, fatigue, or lassitude. ?HEENT: No headaches, difficulty swallowing, tooth/dental problems, or sore throat. No itching, ear ache. +rhinitis (significant improvement) ?CV:  No chest  pain, orthopnea, PND, swelling in lower extremities, anasarca, dizziness, palpitations, syncope ?Resp: +minimal shortness of breath with exertion (stable); dry cough. No excess mucus or change in color of mucus. No hemoptysis. No wheezing.  No chest wall deformity ?GI:  No heartburn, indigestion, abdominal pain, nausea, vomiting, diarrhea, change in bowel habits, loss of appetite, bloody stools.  ?Skin: No rash, lesions, ulcerations ?MSK:  No joint pain or swelling.  No decreased range of motion.  No back pain. ?Neuro: No dizziness or lightheadedness.  ?Psych: No depression or anxiety. Mood stable.  ? ? ? ?Physical Exam: ? ?BP (!) 142/76 (BP Location: Left Arm, Cuff Size: Large)   Pulse 75   Ht 5\' 10"  (1.778 m)   Wt 224 lb (101.6 kg)   SpO2 95%   BMI 32.14 kg/m?  ? ?GEN: Pleasant, interactive, well-appearing; obese; in no acute distress. ?HEENT:  Normocephalic and atraumatic. PERRLA. Sclera white. Nasal turbinates erythematous, moist and patent bilaterally. No rhinorrhea present. Oropharynx erythematous and moist, without exudate or edema. No lesions, ulcerations ?NECK:  Supple w/ fair ROM. No JVD  present. Normal carotid impulses w/o bruits. Thyroid symmetrical with no goiter or nodules palpated. No lymphadenopathy.   ?CV: RRR, no m/r/g, no peripheral edema. Pulses intact, +2 bilaterally. No cyanosis, pallor or clubbing. ?PULMONARY:  Unlabored, regular breathing. Clear b/l without wheeze/rhonchi. No accessory muscle use. No dullness to percussion. ?GI: BS present and normoactive. Soft, non-tender to palpation.  ?Neuro: A/Ox3. No focal deficits noted.   ?Skin: Warm, no lesions or rashe ?Psych: Normal affect and behavior. Judgement and thought content appropriate.  ? ? ? ?Lab Results: ? ?CBC ?   ?Component Value Date/Time  ? WBC 8.1 12/28/2021 1451  ? RBC 4.63 12/28/2021 1451  ? HGB 14.2 12/28/2021 1451  ? HGB 14.3 02/08/2021 1338  ? HGB 11.8 (L) 10/14/2007 1541  ? HCT 42.6 12/28/2021 1451  ? HCT 42.8 02/08/2021  1338  ? HCT 35.2 (L) 10/14/2007 1541  ? PLT 400.0 12/28/2021 1451  ? PLT 436 02/08/2021 1338  ? MCV 92.0 12/28/2021 1451  ? MCV 90 02/08/2021 1338  ? MCV 96.4 10/14/2007 1541  ? MCH 29.9 02/08/2021 1338  ? MCH 30.8 0

## 2022-02-04 DIAGNOSIS — I10 Essential (primary) hypertension: Secondary | ICD-10-CM | POA: Diagnosis not present

## 2022-02-04 DIAGNOSIS — J449 Chronic obstructive pulmonary disease, unspecified: Secondary | ICD-10-CM | POA: Diagnosis not present

## 2022-02-23 ENCOUNTER — Other Ambulatory Visit: Payer: Self-pay | Admitting: Internal Medicine

## 2022-03-07 DIAGNOSIS — J449 Chronic obstructive pulmonary disease, unspecified: Secondary | ICD-10-CM | POA: Diagnosis not present

## 2022-03-07 DIAGNOSIS — I10 Essential (primary) hypertension: Secondary | ICD-10-CM | POA: Diagnosis not present

## 2022-03-10 ENCOUNTER — Other Ambulatory Visit: Payer: Self-pay | Admitting: Internal Medicine

## 2022-03-14 ENCOUNTER — Telehealth: Payer: Self-pay | Admitting: Internal Medicine

## 2022-03-14 MED ORDER — DILTIAZEM HCL ER COATED BEADS 240 MG PO CP24: ORAL_CAPSULE | ORAL | 2 refills | Status: DC

## 2022-03-14 MED ORDER — METOPROLOL SUCCINATE ER 100 MG PO TB24: ORAL_TABLET | ORAL | 2 refills | Status: DC

## 2022-03-14 NOTE — Telephone Encounter (Signed)
Completed.

## 2022-03-14 NOTE — Telephone Encounter (Signed)
*  STAT* If patient is at the pharmacy, call can be transferred to refill team.   1. Which medications need to be refilled? (please list name of each medication and dose if known)  diltiazem (CARDIZEM CD) 240 MG 24 hr capsule  metoprolol succinate (TOPROL-XL) 100 MG 24 hr tablet  2. Which pharmacy/location (including street and city if local pharmacy) is medication to be sent to? [place] 3. Do they need a 30 day or 90 day supply?  90 day  Pt has an appt on 9/5

## 2022-04-06 DIAGNOSIS — J449 Chronic obstructive pulmonary disease, unspecified: Secondary | ICD-10-CM | POA: Diagnosis not present

## 2022-04-06 DIAGNOSIS — I10 Essential (primary) hypertension: Secondary | ICD-10-CM | POA: Diagnosis not present

## 2022-05-01 ENCOUNTER — Ambulatory Visit: Payer: Medicare Other | Admitting: Pulmonary Disease

## 2022-05-07 DIAGNOSIS — I10 Essential (primary) hypertension: Secondary | ICD-10-CM | POA: Diagnosis not present

## 2022-05-07 DIAGNOSIS — J449 Chronic obstructive pulmonary disease, unspecified: Secondary | ICD-10-CM | POA: Diagnosis not present

## 2022-06-07 ENCOUNTER — Encounter: Payer: Self-pay | Admitting: Pulmonary Disease

## 2022-06-07 ENCOUNTER — Ambulatory Visit (INDEPENDENT_AMBULATORY_CARE_PROVIDER_SITE_OTHER): Payer: Medicare Other | Admitting: Pulmonary Disease

## 2022-06-07 VITALS — BP 130/80 | HR 75 | Ht 70.0 in | Wt 228.2 lb

## 2022-06-07 DIAGNOSIS — R0602 Shortness of breath: Secondary | ICD-10-CM

## 2022-06-07 DIAGNOSIS — Z789 Other specified health status: Secondary | ICD-10-CM | POA: Diagnosis not present

## 2022-06-07 DIAGNOSIS — I10 Essential (primary) hypertension: Secondary | ICD-10-CM | POA: Diagnosis not present

## 2022-06-07 DIAGNOSIS — J449 Chronic obstructive pulmonary disease, unspecified: Secondary | ICD-10-CM | POA: Diagnosis not present

## 2022-06-07 DIAGNOSIS — Z85118 Personal history of other malignant neoplasm of bronchus and lung: Secondary | ICD-10-CM

## 2022-06-07 DIAGNOSIS — Z87891 Personal history of nicotine dependence: Secondary | ICD-10-CM

## 2022-06-07 NOTE — Patient Instructions (Addendum)
Thank you for visiting Dr. Valeta Harms at [facility]. Today we recommend the following:  Orders Placed This Encounter  Procedures   CT CHEST WO CONTRAST   Please continue trelegy   Return in about 4 weeks (around 07/05/2022), or if symptoms worsen or fail to improve, for with APP or Dr. Valeta Harms. To review ct chest results.     Please do your part to reduce the spread of COVID-19.

## 2022-06-07 NOTE — Progress Notes (Signed)
Synopsis: Referred in February 2023 for history of lung cancer by Asencion Noble, MD  Subjective:   PATIENT ID: Jeremy Lopez GENDER: male DOB: 06/16/1951, MRN: [id number]  Chief Complaint  Patient presents with   Follow-up    This is a 71 year old gentleman, history of coronary disease, DES to LAD in 2010, essential hypertension, hyperlipidemia small cell lung cancer right upper lobe 2008.  Treatment ended in February 2009. For the past 10 years, and worse since this past September had recurrent bronchitis. Currently not really seeing any one regarding his lung ca. In September of 2022 had another episode of bronchitis treated with ABX. The congestion seems to be the biggest problem. He was treated with 3 separate rounds of abx. He has been using albuterol and Flonase. Former smoker, heavy smoker, quit at age 72,  starting smoking at age 70 years old.  His son has asthma and has been doing better on a maintenance inhaler.  OV 06/07/2022: Here today for follow-up.Last seen in the office by Roxan Diesel.  Office note reviewed.  Treating his seasonal allergic rhinitis symptoms.  On Xyzal.  For asthmatic bronchitis symptoms currently on Trelegy.  Has not had any more symptoms since going on Trelegy.  Doing really well with this.  Still having some shortness of breath with exertion.  He does talk to me today about alcohol use.  He is drinking 4 IPAs per day.    Past Medical History:  Diagnosis Date   CAD (coronary artery disease), native coronary artery    DES proximal LAD 07/2009; scattered 20-40% lesions; normal LVEF   Essential hypertension    Hyperlipidemia    Small cell lung cancer ([place])    Right upper lobe 2008; no evidence of disease since treatment ended in 11/2007     Family History  Problem Relation Age of Onset   Cancer Father    CAD Brother    Hypertension Brother      Past Surgical History:  Procedure Laterality Date   CORONARY ATHERECTOMY N/A 04/26/2020   Procedure: CORONARY  ATHERECTOMY;  Surgeon: Jettie Booze, MD;  Location: [facility];  Service: Cardiovascular;  Laterality: N/A;   CORONARY STENT INTERVENTION N/A 04/26/2020   Procedure: CORONARY STENT INTERVENTION;  Surgeon: Jettie Booze, MD;  Location: [facility];  Service: Cardiovascular;  Laterality: N/A;   Insertion of left subclavian Port-A-Cath  2008   INTRAVASCULAR ULTRASOUND/IVUS N/A 04/26/2020   Procedure: Intravascular Ultrasound/IVUS;  Surgeon: Jettie Booze, MD;  Location: [facility];  Service: Cardiovascular;  Laterality: N/A;   RIGHT/LEFT HEART CATH AND CORONARY ANGIOGRAPHY N/A 04/26/2020   Procedure: RIGHT/LEFT HEART CATH AND CORONARY ANGIOGRAPHY;  Surgeon: Jettie Booze, MD;  Location: [facility];  Service: Cardiovascular;  Laterality: N/A;   TEMPORARY PACEMAKER N/A 04/26/2020   Procedure: TEMPORARY PACEMAKER;  Surgeon: Jettie Booze, MD;  Location: [facility];  Service: Cardiovascular;  Laterality: N/A;   Video bronchoscopy and video mediastinoscopy  2008    Social History   Socioeconomic History   Marital status: Married    Spouse name: Not on file   Number of children: Not on file   Years of education: Not on file   Highest education level: Not on file  Occupational History   Occupation: Full time    Comment: Education officer, museum for [employer]  Tobacco Use   Smoking status: Former    Types: Cigarettes    Start date: 10/25/1966  Quit date: 10/25/1998    Years since quitting: 23.6   Smokeless tobacco: Never  Vaping Use   Vaping Use: Never used  Substance and Sexual Activity   Alcohol use: Yes    Alcohol/week: 14.0 standard drinks of alcohol    Types: 14 Cans of beer per week   Drug use: No   Sexual activity: Not on file  Other Topics Concern   Not on file  Social History Narrative   Not on file   Social Determinants of Health   Financial Resource Strain: Not on file  Food Insecurity: Not on file   Transportation Needs: Not on file  Physical Activity: Not on file  Stress: Not on file  Social Connections: Not on file  Intimate Partner Violence: Not on file     Allergies  Allergen Reactions   Cephalexin Anaphylaxis    throat swelling Patient said he can take amoxicillin     Outpatient Medications Prior to Visit  Medication Sig Dispense Refill   albuterol (VENTOLIN HFA) 108 (90 Base) MCG/ACT inhaler Inhale 2 puffs into the lungs every 6 (six) hours as needed for wheezing or shortness of breath.     fluticasone (FLONASE) 50 MCG/ACT nasal spray Place 2 sprays into both nostrils daily as needed for allergies or rhinitis.     Fluticasone-Umeclidin-Vilant (TRELEGY ELLIPTA) 100-62.5-25 MCG/ACT AEPB Inhale 1 puff into the lungs daily. 60 each 0   Spacer/Aero-Holding Dorise Bullion Use with inhaler 1 each 2   amLODipine (NORVASC) 2.5 MG tablet Take 1 tablet (2.5 mg total) by mouth daily. 180 tablet 3   aspirin 81 MG tablet Take 81 mg by mouth daily.     benzonatate (TESSALON) 200 MG capsule Take 1 capsule (200 mg total) by mouth 3 (three) times daily as needed. (Patient not taking: Reported on 06/07/2022) 30 capsule 1   Budeson-Glycopyrrol-Formoterol (BREZTRI AEROSPHERE) 160-9-4.8 MCG/ACT AERO Inhale 2 puffs into the lungs in the morning and at bedtime. 5.9 g 0   Cholecalciferol (VITAMIN D) 50 MCG (2000 UT) tablet Take 2,000 Units by mouth daily.     clopidogrel (PLAVIX) 75 MG tablet Take 1 tablet (75 mg total) by mouth daily with breakfast. 90 tablet 3   diltiazem (CARDIZEM CD) 240 MG 24 hr capsule TAKE ONE CAPSULE (240 MG TOTAL) BY MOUTHDAILY. 90 capsule 2   levocetirizine (XYZAL) 5 MG tablet Take 1 tablet (5 mg total) by mouth every evening. (Patient not taking: Reported on 06/07/2022) 30 tablet 5   metoprolol succinate (TOPROL-XL) 100 MG 24 hr tablet TAKE ONE TABLET (100MG  TOTAL) BY MOUTH DAILY. TAKE WITH OR IMMEDIATELY FOLLOWING A MEAL. 90 tablet 2   nitroGLYCERIN (NITROSTAT) 0.4 MG SL  tablet Place 1 tablet (0.4 mg total) under the tongue every 5 (five) minutes as needed for chest pain. 25 tablet 2   pantoprazole (PROTONIX) 40 MG tablet Take 1 tablet (40 mg total) by mouth daily. 30 tablet 5   rosuvastatin (CRESTOR) 40 MG tablet TAKE ONE (1) TABLET BY MOUTH EVERY DAY 90 tablet 3   Facility-Administered Medications Prior to Visit  Medication Dose Route Frequency Provider Last Rate Last Admin   regadenoson (LEXISCAN) injection SOLN 0.4 mg  0.4 mg Intravenous Once O'Neal, Cassie Freer, MD        Review of Systems  Constitutional:  Negative for chills, fever, malaise/fatigue and weight loss.  HENT:  Negative for hearing loss, sore throat and tinnitus.   Eyes:  Negative for blurred vision and double vision.  Respiratory:  Positive for cough, sputum production and shortness of breath. Negative for hemoptysis, wheezing and stridor.   Cardiovascular:  Negative for chest pain, palpitations, orthopnea, leg swelling and PND.  Gastrointestinal:  Negative for abdominal pain, constipation, diarrhea, heartburn, nausea and vomiting.  Genitourinary:  Negative for dysuria, hematuria and urgency.  Musculoskeletal:  Negative for joint pain and myalgias.  Skin:  Negative for itching and rash.  Neurological:  Negative for dizziness, tingling, weakness and headaches.  Endo/Heme/Allergies:  Negative for environmental allergies. Does not bruise/bleed easily.  Psychiatric/Behavioral:  Negative for depression. The patient is not nervous/anxious and does not have insomnia.   All other systems reviewed and are negative.    Objective:  Physical Exam Vitals reviewed.  Constitutional:      General: He is not in acute distress.    Appearance: He is well-developed. He is obese.  HENT:     Head: Normocephalic and atraumatic.  Eyes:     General: No scleral icterus.    Conjunctiva/sclera: Conjunctivae normal.     Pupils: Pupils are equal, round, and reactive to light.  Neck:     Vascular: No  JVD.     Trachea: No tracheal deviation.  Cardiovascular:     Rate and Rhythm: Normal rate and regular rhythm.     Heart sounds: Normal heart sounds. No murmur heard. Pulmonary:     Effort: Pulmonary effort is normal. No tachypnea, accessory muscle usage or respiratory distress.     Breath sounds: No stridor. No wheezing, rhonchi or rales.  Abdominal:     General: There is distension.     Palpations: Abdomen is soft.     Tenderness: There is no abdominal tenderness.  Musculoskeletal:        General: No tenderness.     Cervical back: Neck supple.     Right lower leg: Edema present.     Left lower leg: Edema present.  Lymphadenopathy:     Cervical: No cervical adenopathy.  Skin:    General: Skin is warm and dry.     Capillary Refill: Capillary refill takes less than 2 seconds.     Findings: No rash.  Neurological:     Mental Status: He is alert and oriented to person, place, and time.  Psychiatric:        Behavior: Behavior normal.      Vitals:   06/07/22 1055  BP: 130/80  Pulse: 75  SpO2: 94%  Weight: 228 lb 3.2 oz (103.5 kg)  Height: 5\' 10"  (1.778 m)   94% on RA BMI Readings from Last 3 Encounters:  06/07/22 32.74 kg/m  01/23/22 32.14 kg/m  12/28/21 32.31 kg/m   Wt Readings from Last 3 Encounters:  06/07/22 228 lb 3.2 oz (103.5 kg)  01/23/22 224 lb (101.6 kg)  12/28/21 225 lb 3.2 oz (102.2 kg)     CBC    Component Value Date/Time   WBC 8.1 12/28/2021 1451   RBC 4.63 12/28/2021 1451   HGB 14.2 12/28/2021 1451   HGB 14.3 02/08/2021 1338   HGB 11.8 (L) 10/14/2007 1541   HCT 42.6 12/28/2021 1451   HCT 42.8 02/08/2021 1338   HCT 35.2 (L) 10/14/2007 1541   PLT 400.0 12/28/2021 1451   PLT 436 02/08/2021 1338   MCV 92.0 12/28/2021 1451   MCV 90 02/08/2021 1338   MCV 96.4 10/14/2007 1541   MCH 29.9 02/08/2021 1338   MCH 30.8 01/02/2021 0935   MCHC 33.3 12/28/2021 1451   RDW 14.2 12/28/2021 1451  RDW 13.8 02/08/2021 1338   RDW 20.5 (H) 10/14/2007  1541   LYMPHSABS 1.5 12/28/2021 1451   LYMPHSABS 1.5 01/04/2016 1611   LYMPHSABS 0.1 (L) 10/14/2007 1541   MONOABS 0.7 12/28/2021 1451   MONOABS 0.2 10/14/2007 1541   EOSABS 0.1 12/28/2021 1451   EOSABS 0.1 01/04/2016 1611   BASOSABS 0.1 12/28/2021 1451   BASOSABS 0.1 01/04/2016 1611   BASOSABS 0.0 10/14/2007 1541     Chest Imaging: July 2021 CTA chest: No evidence of PE, paramediastinal fibrosis with history of radiation.  Evidence of centrilobular emphysema. The patient's images have been independently reviewed by me.     Pulmonary Functions Testing Results:    Latest Ref Rng & Units 01/23/2022    9:52 AM  PFT Results  FVC-Pre L 3.87   FVC-Predicted Pre % 87   FVC-Post L 3.96   FVC-Predicted Post % 89   Pre FEV1/FVC % % 70   Post FEV1/FCV % % 73   FEV1-Pre L 2.73   FEV1-Predicted Pre % 83   FEV1-Post L 2.87   DLCO uncorrected ml/min/mmHg 19.52   DLCO UNC% % 75   DLCO corrected ml/min/mmHg 19.52   DLCO COR %Predicted % 75   DLVA Predicted % 83   TLC L 6.30   TLC % Predicted % 89   RV % Predicted % 103    FeNO:   Pathology:   Echocardiogram:   Heart Catheterization:     Assessment & Plan:     ICD-10-CM   1. SOB (shortness of breath)  R06.02 CT CHEST WO CONTRAST    2. History of lung cancer  Z85.118     3. Former smoker  Z87.891     4. Alcohol use  Z78.9       Discussion:  This is a 71 year old gentleman recurrent bronchitis symptoms.  History of's tobacco use, former smoker quit 20 years ago has a history of lung cancer status post radiation with radiation-induced fibrosis of the lung.  Has history of coronary disease and 2 prior cardiac stents.  Daily alcohol use x4 beers per day.  Plan: Overall has multifactorial etiology for shortness of breath. Continue Trelegy. We will get a repeat noncontrasted CT scan of the chest to see if there is any worsening of his fibrosis changes or anything new going on in his chest with his history of  malignancy. He has an appointment to see cardiology next week. Follow-up with [place] in 6 months    Current Outpatient Medications:    albuterol (VENTOLIN HFA) 108 (90 Base) MCG/ACT inhaler, Inhale 2 puffs into the lungs every 6 (six) hours as needed for wheezing or shortness of breath., Disp: , Rfl:    fluticasone (FLONASE) 50 MCG/ACT nasal spray, Place 2 sprays into both nostrils daily as needed for allergies or rhinitis., Disp: , Rfl:    Fluticasone-Umeclidin-Vilant (TRELEGY ELLIPTA) 100-62.5-25 MCG/ACT AEPB, Inhale 1 puff into the lungs daily., Disp: 60 each, Rfl: 0   Spacer/Aero-Holding Chambers DEVI, Use with inhaler, Disp: 1 each, Rfl: 2   amLODipine (NORVASC) 2.5 MG tablet, Take 1 tablet (2.5 mg total) by mouth daily., Disp: 180 tablet, Rfl: 3   aspirin 81 MG tablet, Take 81 mg by mouth daily., Disp: , Rfl:    benzonatate (TESSALON) 200 MG capsule, Take 1 capsule (200 mg total) by mouth 3 (three) times daily as needed. (Patient not taking: Reported on 06/07/2022), Disp: 30 capsule, Rfl: 1   Budeson-Glycopyrrol-Formoterol (BREZTRI AEROSPHERE) 160-9-4.8 MCG/ACT AERO, Inhale 2  puffs into the lungs in the morning and at bedtime., Disp: 5.9 g, Rfl: 0   Cholecalciferol (VITAMIN D) 50 MCG (2000 UT) tablet, Take 2,000 Units by mouth daily., Disp: , Rfl:    clopidogrel (PLAVIX) 75 MG tablet, Take 1 tablet (75 mg total) by mouth daily with breakfast., Disp: 90 tablet, Rfl: 3   diltiazem (CARDIZEM CD) 240 MG 24 hr capsule, TAKE ONE CAPSULE (240 MG TOTAL) BY MOUTHDAILY., Disp: 90 capsule, Rfl: 2   levocetirizine (XYZAL) 5 MG tablet, Take 1 tablet (5 mg total) by mouth every evening. (Patient not taking: Reported on 06/07/2022), Disp: 30 tablet, Rfl: 5   metoprolol succinate (TOPROL-XL) 100 MG 24 hr tablet, TAKE ONE TABLET (100MG  TOTAL) BY MOUTH DAILY. TAKE WITH OR IMMEDIATELY FOLLOWING A MEAL., Disp: 90 tablet, Rfl: 2   nitroGLYCERIN (NITROSTAT) 0.4 MG SL tablet, Place 1 tablet (0.4 mg total) under the  tongue every 5 (five) minutes as needed for chest pain., Disp: 25 tablet, Rfl: 2   pantoprazole (PROTONIX) 40 MG tablet, Take 1 tablet (40 mg total) by mouth daily., Disp: 30 tablet, Rfl: 5   rosuvastatin (CRESTOR) 40 MG tablet, TAKE ONE (1) TABLET BY MOUTH EVERY DAY, Disp: 90 tablet, Rfl: 3 No current facility-administered medications for this visit.  Facility-Administered Medications Ordered in Other Visits:    regadenoson (LEXISCAN) injection SOLN 0.4 mg, 0.4 mg, Intravenous, Once, O'Neal, Cassie Freer, MD   Garner Nash, DO [facility] 06/07/2022 11:13 AM

## 2022-06-11 ENCOUNTER — Encounter: Payer: Self-pay | Admitting: Physician Assistant

## 2022-06-11 NOTE — H&P (View-Only) (Signed)
Cardiology Office Note    Date:  06/12/2022   ID:  Jeremy Lopez, DOB 10-31-1950, MRN [id number]  PCP:  Asencion Noble, MD  Cardiologist:  Dorris Carnes, MD  Electrophysiologist:  None   Chief Complaint: f/u CAD, DOE  History of Present Illness:   Jeremy Lopez is a 71 y.o. male with history of CAD s/p DES to prox LAD in 2010, DES to [place] 04/2020, HTN, HLD, remote lung CA (tx with radiation/chemo), habitual ETOH intake, former tobacco abuse who is seen for annual follow-up. His first stent was prompted by chest pain that felt like elephant sitting on his chest. In 2021 he developed DOE and chest tightness. Stress test was nonischemic but cath was pursued due to persistent symptoms resulting in PCI. EF was normal at that time. Of note he also had had a CTA around that time showing radiation fibrosis. He does feel that PCI improved his symptoms. He also follows with pulmonology.  He comes in for usual follow-up today reporting gradual onset of DOE over the last 6 months. He has a chronic unchanged cough (clearing-throat type). He saw pulmonology 06/07/22 who plans a CT chest this week to f/u. No hemoptysis, chest pain, orthopnea, significant weight changes. He h/o dependent edema at the end of the day for which he is no longer on amlodipine. He drinks 3 beers a day and we discussed cutting down. He recalls staying at the [facility] in [place] back in the 1970s on business.   Labwork independently reviewed: 12/2021 CBC wnl 02/2021 BMET Cr 1.15, K 4.6 12/2020 LDL 49, trig 112 12/2019 TSH wnl 2017 LFTs ok  Cardiology Studies:   Studies reviewed are outlined and summarized above. Reports included below if pertinent.   Cath 04/2020 Mid RCA lesion is 99% stenosed, heavily calcified. Following atherectomy, A drug-eluting stent was successfully placed using a STENT RESOLUTE ONYX 3.5X15, postdilated to 3.75 mm; stent optimized with IVUS. Post intervention, there is a 0% residual stenosis. Mid RCA  to Dist RCA lesion is 40% stenosed. Previously placed Prox LAD stent, is widely patent. The left ventricular systolic function is normal. LV end diastolic pressure is normal. The left ventricular ejection fraction is 55-65% by visual estimate. There is no aortic valve stenosis.   Continue aggressive secondary prevention.  Still eligible for same-day discharge.   Consider Plavix monotherapy given his diffuse disease after DAPT is completed.    Past Medical History:  Diagnosis Date   CAD (coronary artery disease), native coronary artery    DES proximal LAD 07/2009; DES to RCA 04/2020   Essential hypertension    Hyperlipidemia    Small cell lung cancer ([place])    Right upper lobe 2008; no evidence of disease since treatment ended in 11/2007    Past Surgical History:  Procedure Laterality Date   CORONARY ATHERECTOMY N/A 04/26/2020   Procedure: CORONARY ATHERECTOMY;  Surgeon: Jettie Booze, MD;  Location: [facility];  Service: Cardiovascular;  Laterality: N/A;   CORONARY STENT INTERVENTION N/A 04/26/2020   Procedure: CORONARY STENT INTERVENTION;  Surgeon: Jettie Booze, MD;  Location: [facility];  Service: Cardiovascular;  Laterality: N/A;   Insertion of left subclavian Port-A-Cath  2008   INTRAVASCULAR ULTRASOUND/IVUS N/A 04/26/2020   Procedure: Intravascular Ultrasound/IVUS;  Surgeon: Jettie Booze, MD;  Location: [place];  Service: Cardiovascular;  Laterality: N/A;   RIGHT/LEFT HEART CATH AND CORONARY ANGIOGRAPHY N/A 04/26/2020   Procedure: RIGHT/LEFT HEART CATH AND CORONARY  ANGIOGRAPHY;  Surgeon: Jettie Booze, MD;  Location: [facility];  Service: Cardiovascular;  Laterality: N/A;   TEMPORARY PACEMAKER N/A 04/26/2020   Procedure: TEMPORARY PACEMAKER;  Surgeon: Jettie Booze, MD;  Location: [facility];  Service: Cardiovascular;  Laterality: N/A;   Video bronchoscopy and video mediastinoscopy  2008    Current  Medications: Current Meds  Medication Sig   albuterol (VENTOLIN HFA) 108 (90 Base) MCG/ACT inhaler Inhale 2 puffs into the lungs every 6 (six) hours as needed for wheezing or shortness of breath.   Budeson-Glycopyrrol-Formoterol (BREZTRI AEROSPHERE) 160-9-4.8 MCG/ACT AERO Inhale 2 puffs into the lungs in the morning and at bedtime.   Cholecalciferol (VITAMIN D) 50 MCG (2000 UT) tablet Take 2,000 Units by mouth daily.   clopidogrel (PLAVIX) 75 MG tablet Take 1 tablet (75 mg total) by mouth daily with breakfast.   diltiazem (CARDIZEM CD) 240 MG 24 hr capsule TAKE ONE CAPSULE (240 MG TOTAL) BY MOUTHDAILY.   fluticasone (FLONASE) 50 MCG/ACT nasal spray Place 2 sprays into both nostrils daily as needed for allergies or rhinitis.   Fluticasone-Umeclidin-Vilant (TRELEGY ELLIPTA) 100-62.5-25 MCG/ACT AEPB Inhale 1 puff into the lungs daily.   levocetirizine (XYZAL) 5 MG tablet Take 1 tablet (5 mg total) by mouth every evening.   metoprolol succinate (TOPROL-XL) 100 MG 24 hr tablet TAKE ONE TABLET (100MG  TOTAL) BY MOUTH DAILY. TAKE WITH OR IMMEDIATELY FOLLOWING A MEAL.   pantoprazole (PROTONIX) 40 MG tablet Take 1 tablet (40 mg total) by mouth daily.   rosuvastatin (CRESTOR) 40 MG tablet TAKE ONE (1) TABLET BY MOUTH EVERY DAY   Spacer/Aero-Holding Chambers DEVI Use with inhaler   [DISCONTINUED] amLODipine (NORVASC) 2.5 MG tablet Take 1 tablet (2.5 mg total) by mouth daily.   [DISCONTINUED] aspirin 81 MG tablet Take 81 mg by mouth daily.   [DISCONTINUED] nitroGLYCERIN (NITROSTAT) 0.4 MG SL tablet Place 1 tablet (0.4 mg total) under the tongue every 5 (five) minutes as needed for chest pain.      Allergies:   Cephalexin and Amlodipine   Social History   Socioeconomic History   Marital status: Married    Spouse name: Not on file   Number of children: Not on file   Years of education: Not on file   Highest education level: Not on file  Occupational History   Occupation: Full time    Comment:  Education officer, museum for [employer]  Tobacco Use   Smoking status: Former    Types: Cigarettes    Start date: 10/25/1966    Quit date: 10/25/1998    Years since quitting: 23.6   Smokeless tobacco: Never  Vaping Use   Vaping Use: Never used  Substance and Sexual Activity   Alcohol use: Yes    Alcohol/week: 14.0 standard drinks of alcohol    Types: 14 Cans of beer per week   Drug use: No   Sexual activity: Not on file  Other Topics Concern   Not on file  Social History Narrative   Not on file   Social Determinants of Health   Financial Resource Strain: Not on file  Food Insecurity: Not on file  Transportation Needs: Not on file  Physical Activity: Not on file  Stress: Not on file  Social Connections: Not on file     Family History:  The patient's family history includes CAD in his brother; Cancer in his father; Hypertension in his brother.  ROS:   Please see the history of present illness.  All  other systems are reviewed and otherwise negative.    EKG(s)/Additional Labs   EKG:  EKG is ordered today, personally reviewed, demonstrating NSR 88bpm, no acute STT changes.  Recent Labs: 12/28/2021: Hemoglobin 14.2; Platelets 400.0  Recent Lipid Panel    Component Value Date/Time   CHOL 120 01/02/2021 0935   CHOL 120 01/04/2016 1611   TRIG 112 01/02/2021 0935   HDL 49 01/02/2021 0935   HDL 47 01/04/2016 1611   CHOLHDL 2.4 01/02/2021 0935   VLDL 22 01/02/2021 0935   LDLCALC 49 01/02/2021 0935   LDLCALC 61 12/28/2019 1501    PHYSICAL EXAM:    VS:  BP 128/68   Pulse 88   Ht 5\' 10"  (1.778 m)   Wt 228 lb (103.4 kg)   SpO2 95%   BMI 32.71 kg/m   BMI: Body mass index is 32.71 kg/m.  GEN: Well nourished, well developed male in no acute distress HEENT: normocephalic, atraumatic Neck: no JVD, carotid bruits, or masses Cardiac: RRR; no murmurs, rubs, or gallops, sockline edema (soft) noted bilaterally Respiratory:  clear to auscultation bilaterally, normal work of  breathing GI: soft, nontender, nondistended, + BS MS: no deformity or atrophy Skin: warm and dry, no rash Neuro:  Alert and Oriented x 3, Strength and sensation are intact, follows commands Psych: euthymic mood, full affect  Wt Readings from Last 3 Encounters:  06/12/22 228 lb (103.4 kg)  06/07/22 228 lb 3.2 oz (103.5 kg)  01/23/22 224 lb (101.6 kg)     ASSESSMENT & PLAN:   1. DOE, fatigue - nonspecific, gradual x 6 months. History complicated by prior lung CA and radiation fibrosis. Pulmonology has CT planned for this week. Per discussion with patient, he is not eager to pursue stress testing. We will start with echocardiogram first. When results come back will need to review CT findings and further plans before deciding whether we need to pursue additional ischemic testing. He has not had any recent angina though does recall having DOE in 2021 that was improved with his stent. His NST was unrevealing at that time. Perhaps cardiac PET would be of use. We'll otherwise update labs today to ensure no acute changes.  2. CAD - he is maintained on clopidogrel monotherapy per Dr. Harrington Challenger (no longer on ASA). He is also on metoprolol, diltiazem, and rosuvastatin.   3. HTN - controlled on present regimen. Though diltiazem is not a first line med for HTN, he otherwise has been on this medicine for several years and seems to be tolerating OK. He does have history of sockline dependent edema at the end of the day so could consider changing to ARB if needed, though HR is in the high 80s on diltiazem. We'll await his echocardiogram.   4. Hyperlipidemia - continue rosuvastatin. Will check lipids/CMET today. Had salad but hopefully should not impact levels too much.    Disposition: F/u with me/APP in 6 weeks.   Medication Adjustments/Labs and Tests Ordered: Current medicines are reviewed at length with the patient today.  Concerns regarding medicines are outlined above. Medication changes, Labs and Tests  ordered today are summarized above and listed in the Patient Instructions accessible in Encounters.    Signed, Charlie Pitter, PA-C  06/12/2022 1:35 PM    [facility] Location in [place]. [place], [place] [address] Ph: [phone]; Fax [phone]

## 2022-06-11 NOTE — Progress Notes (Signed)
Cardiology Office Note    Date:  06/12/2022   ID:  Jeremy Lopez, DOB 08/17/1951, MRN [id number]  PCP:  Asencion Noble, MD  Cardiologist:  Dorris Carnes, MD  Electrophysiologist:  None   Chief Complaint: f/u CAD, DOE  History of Present Illness:   Jeremy Lopez is a 71 y.o. male with history of CAD s/p DES to prox LAD in 2010, DES to [place] 04/2020, HTN, HLD, remote lung CA (tx with radiation/chemo), habitual ETOH intake, former tobacco abuse who is seen for annual follow-up. His first stent was prompted by chest pain that felt like elephant sitting on his chest. In 2021 he developed DOE and chest tightness. Stress test was nonischemic but cath was pursued due to persistent symptoms resulting in PCI. EF was normal at that time. Of note he also had had a CTA around that time showing radiation fibrosis. He does feel that PCI improved his symptoms. He also follows with pulmonology.  He comes in for usual follow-up today reporting gradual onset of DOE over the last 6 months. He has a chronic unchanged cough (clearing-throat type). He saw pulmonology 06/07/22 who plans a CT chest this week to f/u. No hemoptysis, chest pain, orthopnea, significant weight changes. He h/o dependent edema at the end of the day for which he is no longer on amlodipine. He drinks 3 beers a day and we discussed cutting down. He recalls staying at the [facility] in [place] back in the 1970s on business.   Labwork independently reviewed: 12/2021 CBC wnl 02/2021 BMET Cr 1.15, K 4.6 12/2020 LDL 49, trig 112 12/2019 TSH wnl 2017 LFTs ok  Cardiology Studies:   Studies reviewed are outlined and summarized above. Reports included below if pertinent.   Cath 04/2020 Mid RCA lesion is 99% stenosed, heavily calcified. Following atherectomy, A drug-eluting stent was successfully placed using a STENT RESOLUTE ONYX 3.5X15, postdilated to 3.75 mm; stent optimized with IVUS. Post intervention, there is a 0% residual stenosis. Mid RCA  to Dist RCA lesion is 40% stenosed. Previously placed Prox LAD stent, is widely patent. The left ventricular systolic function is normal. LV end diastolic pressure is normal. The left ventricular ejection fraction is 55-65% by visual estimate. There is no aortic valve stenosis.   Continue aggressive secondary prevention.  Still eligible for same-day discharge.   Consider Plavix monotherapy given his diffuse disease after DAPT is completed.    Past Medical History:  Diagnosis Date   CAD (coronary artery disease), native coronary artery    DES proximal LAD 07/2009; DES to RCA 04/2020   Essential hypertension    Hyperlipidemia    Small cell lung cancer ([place])    Right upper lobe 2008; no evidence of disease since treatment ended in 11/2007    Past Surgical History:  Procedure Laterality Date   CORONARY ATHERECTOMY N/A 04/26/2020   Procedure: CORONARY ATHERECTOMY;  Surgeon: Jettie Booze, MD;  Location: [place] [facility];  Service: Cardiovascular;  Laterality: N/A;   CORONARY STENT INTERVENTION N/A 04/26/2020   Procedure: CORONARY STENT INTERVENTION;  Surgeon: Jettie Booze, MD;  Location: [place] [facility];  Service: Cardiovascular;  Laterality: N/A;   Insertion of left subclavian Port-A-Cath  2008   INTRAVASCULAR ULTRASOUND/IVUS N/A 04/26/2020   Procedure: Intravascular Ultrasound/IVUS;  Surgeon: Jettie Booze, MD;  Location: [facility] [place] [facility];  Service: Cardiovascular;  Laterality: N/A;   RIGHT/LEFT HEART CATH AND CORONARY ANGIOGRAPHY N/A 04/26/2020   Procedure: RIGHT/LEFT HEART CATH AND CORONARY  ANGIOGRAPHY;  Surgeon: Jettie Booze, MD;  Location: [facility];  Service: Cardiovascular;  Laterality: N/A;   TEMPORARY PACEMAKER N/A 04/26/2020   Procedure: TEMPORARY PACEMAKER;  Surgeon: Jettie Booze, MD;  Location: [facility];  Service: Cardiovascular;  Laterality: N/A;   Video bronchoscopy and video mediastinoscopy  2008    Current  Medications: Current Meds  Medication Sig   albuterol (VENTOLIN HFA) 108 (90 Base) MCG/ACT inhaler Inhale 2 puffs into the lungs every 6 (six) hours as needed for wheezing or shortness of breath.   Budeson-Glycopyrrol-Formoterol (BREZTRI AEROSPHERE) 160-9-4.8 MCG/ACT AERO Inhale 2 puffs into the lungs in the morning and at bedtime.   Cholecalciferol (VITAMIN D) 50 MCG (2000 UT) tablet Take 2,000 Units by mouth daily.   clopidogrel (PLAVIX) 75 MG tablet Take 1 tablet (75 mg total) by mouth daily with breakfast.   diltiazem (CARDIZEM CD) 240 MG 24 hr capsule TAKE ONE CAPSULE (240 MG TOTAL) BY MOUTHDAILY.   fluticasone (FLONASE) 50 MCG/ACT nasal spray Place 2 sprays into both nostrils daily as needed for allergies or rhinitis.   Fluticasone-Umeclidin-Vilant (TRELEGY ELLIPTA) 100-62.5-25 MCG/ACT AEPB Inhale 1 puff into the lungs daily.   levocetirizine (XYZAL) 5 MG tablet Take 1 tablet (5 mg total) by mouth every evening.   metoprolol succinate (TOPROL-XL) 100 MG 24 hr tablet TAKE ONE TABLET (100MG  TOTAL) BY MOUTH DAILY. TAKE WITH OR IMMEDIATELY FOLLOWING A MEAL.   pantoprazole (PROTONIX) 40 MG tablet Take 1 tablet (40 mg total) by mouth daily.   rosuvastatin (CRESTOR) 40 MG tablet TAKE ONE (1) TABLET BY MOUTH EVERY DAY   Spacer/Aero-Holding Chambers DEVI Use with inhaler   [DISCONTINUED] amLODipine (NORVASC) 2.5 MG tablet Take 1 tablet (2.5 mg total) by mouth daily.   [DISCONTINUED] aspirin 81 MG tablet Take 81 mg by mouth daily.   [DISCONTINUED] nitroGLYCERIN (NITROSTAT) 0.4 MG SL tablet Place 1 tablet (0.4 mg total) under the tongue every 5 (five) minutes as needed for chest pain.      Allergies:   Cephalexin and Amlodipine   Social History   Socioeconomic History   Marital status: Married    Spouse name: Not on file   Number of children: Not on file   Years of education: Not on file   Highest education level: Not on file  Occupational History   Occupation: Full time    Comment:  Education officer, museum for [employer]  Tobacco Use   Smoking status: Former    Types: Cigarettes    Start date: 10/25/1966    Quit date: 10/25/1998    Years since quitting: 23.6   Smokeless tobacco: Never  Vaping Use   Vaping Use: Never used  Substance and Sexual Activity   Alcohol use: Yes    Alcohol/week: 14.0 standard drinks of alcohol    Types: 14 Cans of beer per week   Drug use: No   Sexual activity: Not on file  Other Topics Concern   Not on file  Social History Narrative   Not on file   Social Determinants of Health   Financial Resource Strain: Not on file  Food Insecurity: Not on file  Transportation Needs: Not on file  Physical Activity: Not on file  Stress: Not on file  Social Connections: Not on file     Family History:  The patient's family history includes CAD in his brother; Cancer in his father; Hypertension in his brother.  ROS:   Please see the history of present illness.  All  other systems are reviewed and otherwise negative.    EKG(s)/Additional Labs   EKG:  EKG is ordered today, personally reviewed, demonstrating NSR 88bpm, no acute STT changes.  Recent Labs: 12/28/2021: Hemoglobin 14.2; Platelets 400.0  Recent Lipid Panel    Component Value Date/Time   CHOL 120 01/02/2021 0935   CHOL 120 01/04/2016 1611   TRIG 112 01/02/2021 0935   HDL 49 01/02/2021 0935   HDL 47 01/04/2016 1611   CHOLHDL 2.4 01/02/2021 0935   VLDL 22 01/02/2021 0935   LDLCALC 49 01/02/2021 0935   LDLCALC 61 12/28/2019 1501    PHYSICAL EXAM:    VS:  BP 128/68   Pulse 88   Ht 5\' 10"  (1.778 m)   Wt 228 lb (103.4 kg)   SpO2 95%   BMI 32.71 kg/m   BMI: Body mass index is 32.71 kg/m.  GEN: Well nourished, well developed male in no acute distress HEENT: normocephalic, atraumatic Neck: no JVD, carotid bruits, or masses Cardiac: RRR; no murmurs, rubs, or gallops, sockline edema (soft) noted bilaterally Respiratory:  clear to auscultation bilaterally, normal work of  breathing GI: soft, nontender, nondistended, + BS MS: no deformity or atrophy Skin: warm and dry, no rash Neuro:  Alert and Oriented x 3, Strength and sensation are intact, follows commands Psych: euthymic mood, full affect  Wt Readings from Last 3 Encounters:  06/12/22 228 lb (103.4 kg)  06/07/22 228 lb 3.2 oz (103.5 kg)  01/23/22 224 lb (101.6 kg)     ASSESSMENT & PLAN:   1. DOE, fatigue - nonspecific, gradual x 6 months. History complicated by prior lung CA and radiation fibrosis. Pulmonology has CT planned for this week. Per discussion with patient, he is not eager to pursue stress testing. We will start with echocardiogram first. When results come back will need to review CT findings and further plans before deciding whether we need to pursue additional ischemic testing. He has not had any recent angina though does recall having DOE in 2021 that was improved with his stent. His NST was unrevealing at that time. Perhaps cardiac PET would be of use. We'll otherwise update labs today to ensure no acute changes.  2. CAD - he is maintained on clopidogrel monotherapy per Dr. Harrington Challenger (no longer on ASA). He is also on metoprolol, diltiazem, and rosuvastatin.   3. HTN - controlled on present regimen. Though diltiazem is not a first line med for HTN, he otherwise has been on this medicine for several years and seems to be tolerating OK. He does have history of sockline dependent edema at the end of the day so could consider changing to ARB if needed, though HR is in the high 80s on diltiazem. We'll await his echocardiogram.   4. Hyperlipidemia - continue rosuvastatin. Will check lipids/CMET today. Had salad but hopefully should not impact levels too much.    Disposition: F/u with me/APP in 6 weeks.   Medication Adjustments/Labs and Tests Ordered: Current medicines are reviewed at length with the patient today.  Concerns regarding medicines are outlined above. Medication changes, Labs and Tests  ordered today are summarized above and listed in the Patient Instructions accessible in Encounters.    Signed, Charlie Pitter, PA-C  06/12/2022 1:35 PM    [facility] Location in [place]. [place], [place] [address] Ph: [phone]; Fax [phone]

## 2022-06-12 ENCOUNTER — Ambulatory Visit: Payer: Medicare Other | Attending: Physician Assistant | Admitting: Physician Assistant

## 2022-06-12 ENCOUNTER — Encounter: Payer: Self-pay | Admitting: Physician Assistant

## 2022-06-12 VITALS — BP 128/68 | HR 88 | Ht 70.0 in | Wt 228.0 lb

## 2022-06-12 DIAGNOSIS — I10 Essential (primary) hypertension: Secondary | ICD-10-CM | POA: Diagnosis not present

## 2022-06-12 DIAGNOSIS — E785 Hyperlipidemia, unspecified: Secondary | ICD-10-CM | POA: Insufficient documentation

## 2022-06-12 DIAGNOSIS — I2510 Atherosclerotic heart disease of native coronary artery without angina pectoris: Secondary | ICD-10-CM | POA: Insufficient documentation

## 2022-06-12 DIAGNOSIS — R0609 Other forms of dyspnea: Secondary | ICD-10-CM | POA: Insufficient documentation

## 2022-06-12 DIAGNOSIS — R5383 Other fatigue: Secondary | ICD-10-CM | POA: Diagnosis not present

## 2022-06-12 MED ORDER — NITROGLYCERIN 0.4 MG SL SUBL: 0.4000 mg | SUBLINGUAL_TABLET | SUBLINGUAL | 2 refills | Status: AC | PRN

## 2022-06-12 NOTE — Patient Instructions (Signed)
Medication Instructions:  Your physician recommends that you continue on your current medications as directed. Please refer to the Current Medication list given to you today.   Labwork: Cmet,cbc,lipids,bnp,tsh  Testing/Procedures: Your physician has requested that you have an echocardiogram. Echocardiography is a painless test that uses sound waves to create images of your heart. It provides your doctor with information about the size and shape of your heart and how well your heart's chambers and valves are working. This procedure takes approximately one hour. There are no restrictions for this procedure.   Follow-Up: 6 weeks   Any Other Special Instructions Will Be Listed Below (If Applicable).  If you need a refill on your cardiac medications before your next appointment, please call your pharmacy.

## 2022-06-13 ENCOUNTER — Other Ambulatory Visit (HOSPITAL_COMMUNITY)
Admission: RE | Admit: 2022-06-13 | Discharge: 2022-06-13 | Disposition: A | Discharge status: Home / Self Care | Payer: Medicare Other | Source: Ambulatory Visit | Attending: Physician Assistant | Admitting: Physician Assistant

## 2022-06-13 ENCOUNTER — Ambulatory Visit (HOSPITAL_COMMUNITY)
Admission: RE | Admit: 2022-06-13 | Discharge: 2022-06-13 | Disposition: A | Discharge status: Home / Self Care | Payer: Medicare Other | Source: Ambulatory Visit | Attending: Pulmonary Disease | Admitting: Pulmonary Disease

## 2022-06-13 DIAGNOSIS — E785 Hyperlipidemia, unspecified: Secondary | ICD-10-CM

## 2022-06-13 DIAGNOSIS — I10 Essential (primary) hypertension: Secondary | ICD-10-CM | POA: Insufficient documentation

## 2022-06-13 DIAGNOSIS — I2510 Atherosclerotic heart disease of native coronary artery without angina pectoris: Secondary | ICD-10-CM | POA: Insufficient documentation

## 2022-06-13 DIAGNOSIS — R0602 Shortness of breath: Secondary | ICD-10-CM | POA: Insufficient documentation

## 2022-06-13 DIAGNOSIS — R0609 Other forms of dyspnea: Secondary | ICD-10-CM | POA: Diagnosis not present

## 2022-06-13 DIAGNOSIS — R5383 Other fatigue: Secondary | ICD-10-CM | POA: Diagnosis not present

## 2022-06-13 DIAGNOSIS — J984 Other disorders of lung: Secondary | ICD-10-CM | POA: Diagnosis not present

## 2022-06-13 LAB — CBC
HCT: 44.2 % (ref 39.0–52.0)
Hemoglobin: 14.3 g/dL (ref 13.0–17.0)
MCH: 30.4 pg (ref 26.0–34.0)
MCHC: 32.4 g/dL (ref 30.0–36.0)
MCV: 94 fL (ref 80.0–100.0)
Platelets: 385 10*3/uL (ref 150–400)
RBC: 4.7 MIL/uL (ref 4.22–5.81)
RDW: 14.4 % (ref 11.5–15.5)
WBC: 8.2 10*3/uL (ref 4.0–10.5)
nRBC: 0 % (ref 0.0–0.2)

## 2022-06-13 LAB — LIPID PANEL
Cholesterol: 113 mg/dL (ref 0–200)
HDL: 51 mg/dL (ref >40)
LDL Cholesterol: 40 mg/dL (ref 0–99)
Total CHOL/HDL Ratio: 2.2 RATIO
Triglycerides: 110 mg/dL (ref <150)
VLDL: 22 mg/dL (ref 0–40)

## 2022-06-13 LAB — COMPREHENSIVE METABOLIC PANEL
ALT: 21 U/L (ref 0–44)
AST: 17 U/L (ref 15–41)
Albumin: 4.1 g/dL (ref 3.5–5.0)
Alkaline Phosphatase: 78 U/L (ref 38–126)
Anion gap: 9 (ref 5–15)
BUN: 20 mg/dL (ref 8–23)
CO2: 25 mmol/L (ref 22–32)
Calcium: 8.9 mg/dL (ref 8.9–10.3)
Chloride: 105 mmol/L (ref 98–111)
Creatinine, Ser: 1.35 mg/dL — ABNORMAL HIGH (ref 0.61–1.24)
GFR, Estimated: 56 mL/min — ABNORMAL LOW (ref >60)
Glucose, Bld: 95 mg/dL (ref 70–99)
Potassium: 3.9 mmol/L (ref 3.5–5.1)
Sodium: 139 mmol/L (ref 135–145)
Total Bilirubin: 0.6 mg/dL (ref 0.3–1.2)
Total Protein: 7.6 g/dL (ref 6.5–8.1)

## 2022-06-13 LAB — TSH: TSH: 2.578 u[IU]/mL (ref 0.350–4.500)

## 2022-06-13 LAB — BRAIN NATRIURETIC PEPTIDE: B Natriuretic Peptide: 54 pg/mL (ref 0.0–100.0)

## 2022-06-14 ENCOUNTER — Ambulatory Visit: Payer: Medicare Other | Attending: Physician Assistant

## 2022-06-14 DIAGNOSIS — R0609 Other forms of dyspnea: Secondary | ICD-10-CM | POA: Insufficient documentation

## 2022-06-14 LAB — ECHOCARDIOGRAM COMPLETE
AR max vel: 2.93 cm2
AV Peak grad: 5.9 mmHg
Ao pk vel: 1.21 m/s
Area-P 1/2: 2.99 cm2
Calc EF: 66.7 %
S' Lateral: 3.47 cm
Single Plane A2C EF: 66.7 %
Single Plane A4C EF: 67.5 %

## 2022-06-20 NOTE — Progress Notes (Signed)
Jeremy Lopez, please let patient know I reviewed his CT scan.  He has evidence of scarring related to his previous radiation.  He has no other suspicious findings within the lung.  He does have dense calcifications within his coronary arteries.  If he has a cardiologist he should follow-up with them.  If he does not please place referral to cardiology for evaluation of his coronary calcification seen on CT.  Thanks,  BLI  Jeremy Nash, DO [facility] 06/20/2022 12:00 PM

## 2022-06-25 ENCOUNTER — Telehealth: Payer: Self-pay | Admitting: Physician Assistant

## 2022-06-25 DIAGNOSIS — R0609 Other forms of dyspnea: Secondary | ICD-10-CM

## 2022-06-25 NOTE — Telephone Encounter (Signed)
Follow-up from recent cardiology visit - patient recently seen with c/o DOE x  6 months.  We were awaiting echo as well as pulmonology's CT of the chest. 2D echo showed EF 60-65%, G1DD, normal RV, normal IVC size, no acute valve changes. CT of the chest showed scarring as well as extensive coronary calcifications (has known CAD as outlined in recent note). Pulmonology did not suggest that any additional w/u was needed on their end. Therefore I will review with Dr. Harrington Challenger in this message here - Jeremy Lopez, please see my note 06/12/22 with increasing DOE. He had a prior nuclear stress test in 2021 but went on to have cath due to persistent symptoms with subsequent DES to [facility]. Therefore unclear how helpful nuclear stress testing will be. Would appreciate your thoughts on whether we should consider cardiac PET versus go directly to diagnostic R/LHC to more definitively evaluate his dyspnea. He has not had any recent angina though did recall having DOE in 2021 that was improved with stenting. Appreciate your input. Thanks. Jeremy Lopez

## 2022-06-26 NOTE — Telephone Encounter (Signed)
Given normal myoview prior to last intervention, I think with progressive symptoms I would recomm L heart cath to redefine anatomy , make sure stents OK

## 2022-06-26 NOTE — Telephone Encounter (Addendum)
As noted below, Dr. Harrington Challenger recommends to proceed with left heart cath. I called and spoke with patient and he is agreeable. He is very grateful for the update and agrees with this plan.  Shared Decision Making/Informed Consent The risks [stroke (1 in 1000), death (1 in 1000), kidney failure [usually temporary] (1 in 500), bleeding (1 in 200), allergic reaction [possibly serious] (1 in 200)], benefits (diagnostic support and management of coronary artery disease) and alternatives of a cardiac catheterization were discussed in detail with Jeremy Lopez and he is willing to proceed.  Will route to our nursing team to help set this up. Would recommend repeating BMET prior to cath to ensure Cr stable/not uptrending a few days before cath. He would prefer any day next week. ER precautions reviewed.  Please let me know when cath is scheduled so I can do orders. Needs baby ASA day of procedure, continue Plavix, otherwise no specific meds to hold. Has f/u with me in October. Thanks!

## 2022-06-27 ENCOUNTER — Other Ambulatory Visit: Payer: Self-pay | Admitting: Physician Assistant

## 2022-06-27 DIAGNOSIS — R0609 Other forms of dyspnea: Secondary | ICD-10-CM

## 2022-06-27 MED ORDER — SODIUM CHLORIDE 0.9% FLUSH: 3.0000 mL | Freq: Two times a day (BID) | INTRAVENOUS | Status: DC

## 2022-06-27 NOTE — Addendum Note (Signed)
Addended by: Ulice Brilliant T on: 06/27/2022 03:54 PM   Modules accepted: Orders

## 2022-06-27 NOTE — Telephone Encounter (Signed)
Spoke with the patient and made him aware that I was calling to get his cath scheduled. I asked if he had a preference for day and or time. The patient stated he is available any time or day next week.   Got cath scheduled for Tuesday July 03, 2022 at 10:30am with an 8:30am arrival.   Does the patient need to hold his Plavix and if so how many days? I will place lab orders and have patient complete his labs by Friday.  And other special instructions?

## 2022-06-27 NOTE — Telephone Encounter (Signed)
   Cardiac/Peripheral Catheterization   You are scheduled for a Cardiac Catheterization on Tuesday, September 26 with Dr. Peter Martinique.  1. Please arrive at the Main Entrance A at [facility]: [place], [place] [address] on September 26 at 8:30 AM (This time is two hours before your procedure to ensure your preparation). Free valet parking service is available. You will check in at ADMITTING. The support person will be asked to wait in the waiting room.  It is OK to have someone drop you off and come back when you are ready to be discharged.        Special note: Every effort is made to have your procedure done on time. Please understand that emergencies sometimes delay scheduled procedures.    2. Diet: Do not eat solid foods after midnight.  You may have clear liquids until 5 AM the day of the procedure.  3. Labs: You will need to have blood drawn on Thursday, September 21 at [facility]  Lab hours are 8am to 4pm  4. Medication instructions in preparation for your procedure:   Contrast Allergy: No  On the morning of your procedure, take Aspirin 81 mg and Plavix/Clopidogrel and any morning medicines NOT listed above. You may use sips of water.  5. Plan to go home the same day, you will only stay overnight if medically necessary. 6. You MUST have a responsible adult to drive you home. 7. An adult MUST be with you the first 24 hours after you arrive home. 8. Bring a current list of your medications, and the last time and date medication taken. 9. Bring ID and current insurance cards. 10.Please wear clothes that are easy to get on and off and wear slip-on shoes.  Thank you for allowing [place] to care for you!   -- [facility]

## 2022-06-27 NOTE — Telephone Encounter (Signed)
See first note re: meds. Just needs the regular full pre-cath instructions per our clinic protocol (the letter patients get when we schedule in office). Thank you!

## 2022-06-27 NOTE — Progress Notes (Signed)
Pre cath orders

## 2022-06-27 NOTE — Telephone Encounter (Signed)
Patient notified directly and voiced understanding.    Copy of letter sent to the patient via mychart.

## 2022-06-28 ENCOUNTER — Other Ambulatory Visit (HOSPITAL_COMMUNITY)
Admission: RE | Admit: 2022-06-28 | Discharge: 2022-06-28 | Disposition: A | Discharge status: Home / Self Care | Payer: Medicare Other | Source: Ambulatory Visit | Attending: Physician Assistant | Admitting: Physician Assistant

## 2022-06-28 DIAGNOSIS — R0609 Other forms of dyspnea: Secondary | ICD-10-CM | POA: Insufficient documentation

## 2022-06-28 LAB — CBC
HCT: 43.4 % (ref 39.0–52.0)
Hemoglobin: 14.3 g/dL (ref 13.0–17.0)
MCH: 30.9 pg (ref 26.0–34.0)
MCHC: 32.9 g/dL (ref 30.0–36.0)
MCV: 93.7 fL (ref 80.0–100.0)
Platelets: 356 10*3/uL (ref 150–400)
RBC: 4.63 MIL/uL (ref 4.22–5.81)
RDW: 14.2 % (ref 11.5–15.5)
WBC: 7.4 10*3/uL (ref 4.0–10.5)
nRBC: 0 % (ref 0.0–0.2)

## 2022-06-28 LAB — BASIC METABOLIC PANEL
Anion gap: 9 (ref 5–15)
BUN: 20 mg/dL (ref 8–23)
CO2: 26 mmol/L (ref 22–32)
Calcium: 8.8 mg/dL — ABNORMAL LOW (ref 8.9–10.3)
Chloride: 105 mmol/L (ref 98–111)
Creatinine, Ser: 1.25 mg/dL — ABNORMAL HIGH (ref 0.61–1.24)
GFR, Estimated: >60 mL/min (ref >60)
Glucose, Bld: 105 mg/dL — ABNORMAL HIGH (ref 70–99)
Potassium: 4 mmol/L (ref 3.5–5.1)
Sodium: 140 mmol/L (ref 135–145)

## 2022-07-02 ENCOUNTER — Telehealth: Payer: Self-pay | Admitting: *Deleted

## 2022-07-02 NOTE — Telephone Encounter (Signed)
Cardiac Catheterization scheduled at [facility] for: Tuesday July 03, 2022 10:30 AM Arrival time and place: [facility] A at: 8:30 AM  Nothing to eat after midnight prior to procedure, clear liquids until 5 AM day of procedure.  Medication instructions: -Usual morning medications can be taken with sips of water including aspirin 81 mg and Plavix 75 mg.  Confirmed patient has responsible adult to drive home post procedure and be with patient first 24 hours after arriving home.  Patient reports no new symptoms concerning for COVID-19 in the past 10 days.  Reviewed procedure instructions with patient.

## 2022-07-03 ENCOUNTER — Ambulatory Visit (HOSPITAL_COMMUNITY)
Admission: RE | Admit: 2022-07-03 | Discharge: 2022-07-03 | Disposition: A | Discharge status: Home / Self Care | Payer: Medicare Other | Attending: Cardiology | Admitting: Cardiology

## 2022-07-03 ENCOUNTER — Telehealth: Payer: Self-pay | Admitting: Physician Assistant

## 2022-07-03 ENCOUNTER — Encounter (HOSPITAL_COMMUNITY)
Admission: RE | Disposition: A | Discharge status: Home / Self Care | Payer: Self-pay | Source: Home / Self Care | Attending: Cardiology

## 2022-07-03 ENCOUNTER — Other Ambulatory Visit: Payer: Self-pay

## 2022-07-03 ENCOUNTER — Encounter (HOSPITAL_COMMUNITY): Payer: Self-pay | Admitting: Cardiology

## 2022-07-03 DIAGNOSIS — Z87891 Personal history of nicotine dependence: Secondary | ICD-10-CM | POA: Insufficient documentation

## 2022-07-03 DIAGNOSIS — Z7902 Long term (current) use of antithrombotics/antiplatelets: Secondary | ICD-10-CM | POA: Insufficient documentation

## 2022-07-03 DIAGNOSIS — R0609 Other forms of dyspnea: Secondary | ICD-10-CM | POA: Diagnosis not present

## 2022-07-03 DIAGNOSIS — Z923 Personal history of irradiation: Secondary | ICD-10-CM | POA: Diagnosis not present

## 2022-07-03 DIAGNOSIS — Z955 Presence of coronary angioplasty implant and graft: Secondary | ICD-10-CM | POA: Insufficient documentation

## 2022-07-03 DIAGNOSIS — Z85118 Personal history of other malignant neoplasm of bronchus and lung: Secondary | ICD-10-CM | POA: Insufficient documentation

## 2022-07-03 DIAGNOSIS — I10 Essential (primary) hypertension: Secondary | ICD-10-CM | POA: Diagnosis not present

## 2022-07-03 DIAGNOSIS — I2584 Coronary atherosclerosis due to calcified coronary lesion: Secondary | ICD-10-CM | POA: Insufficient documentation

## 2022-07-03 DIAGNOSIS — E785 Hyperlipidemia, unspecified: Secondary | ICD-10-CM | POA: Diagnosis not present

## 2022-07-03 DIAGNOSIS — Z79899 Other long term (current) drug therapy: Secondary | ICD-10-CM

## 2022-07-03 DIAGNOSIS — I2510 Atherosclerotic heart disease of native coronary artery without angina pectoris: Secondary | ICD-10-CM | POA: Diagnosis not present

## 2022-07-03 HISTORY — PX: LEFT HEART CATH AND CORONARY ANGIOGRAPHY: CATH118249

## 2022-07-03 SURGERY — LEFT HEART CATH AND CORONARY ANGIOGRAPHY
Anesthesia: LOCAL

## 2022-07-03 MED ORDER — HEPARIN (PORCINE) IN NACL 1000-0.9 UT/500ML-% IV SOLN
INTRAVENOUS | Status: DC | PRN
  Administered 2022-07-03 (×2): 500 mL

## 2022-07-03 MED ORDER — ASPIRIN 81 MG PO CHEW: 81.0000 mg | CHEWABLE_TABLET | ORAL | Status: DC

## 2022-07-03 MED ORDER — LIDOCAINE HCL (PF) 1 % IJ SOLN
INTRAMUSCULAR | Status: DC | PRN
  Administered 2022-07-03: 2 mL

## 2022-07-03 MED ORDER — HEPARIN SODIUM (PORCINE) 1000 UNIT/ML IJ SOLN
INTRAMUSCULAR | Status: DC | PRN
  Administered 2022-07-03: 5000 [IU] via INTRAVENOUS

## 2022-07-03 MED ORDER — SODIUM CHLORIDE 0.9 % WEIGHT BASED INFUSION: 1.0000 mL/kg/h | INTRAVENOUS | Status: DC

## 2022-07-03 MED ORDER — SODIUM CHLORIDE 0.9% FLUSH: 3.0000 mL | Freq: Two times a day (BID) | INTRAVENOUS | Status: DC

## 2022-07-03 MED ORDER — LIDOCAINE HCL (PF) 1 % IJ SOLN
INTRAMUSCULAR | Status: AC
  Filled 2022-07-03: qty 30

## 2022-07-03 MED ORDER — METOPROLOL SUCCINATE ER 25 MG PO TB24
ORAL_TABLET | ORAL | Status: AC
  Administered 2022-07-03: 100 mg via ORAL
  Filled 2022-07-03: qty 4

## 2022-07-03 MED ORDER — HEPARIN (PORCINE) IN NACL 1000-0.9 UT/500ML-% IV SOLN
INTRAVENOUS | Status: AC
  Filled 2022-07-03: qty 1000

## 2022-07-03 MED ORDER — VERAPAMIL HCL 2.5 MG/ML IV SOLN
INTRAVENOUS | Status: DC | PRN
  Administered 2022-07-03: 10 mL via INTRA_ARTERIAL

## 2022-07-03 MED ORDER — SODIUM CHLORIDE 0.9% FLUSH: 3.0000 mL | INTRAVENOUS | Status: DC | PRN

## 2022-07-03 MED ORDER — METOPROLOL SUCCINATE ER 25 MG PO TB24: 100.0000 mg | ORAL_TABLET | Freq: Every day | ORAL | Status: DC

## 2022-07-03 MED ORDER — ONDANSETRON HCL 4 MG/2ML IJ SOLN: 4.0000 mg | Freq: Four times a day (QID) | INTRAMUSCULAR | Status: DC | PRN

## 2022-07-03 MED ORDER — MIDAZOLAM HCL 2 MG/2ML IJ SOLN
INTRAMUSCULAR | Status: DC | PRN
  Administered 2022-07-03: 2 mg via INTRAVENOUS

## 2022-07-03 MED ORDER — VERAPAMIL HCL 2.5 MG/ML IV SOLN
INTRAVENOUS | Status: AC
  Filled 2022-07-03: qty 2

## 2022-07-03 MED ORDER — HEPARIN SODIUM (PORCINE) 1000 UNIT/ML IJ SOLN
INTRAMUSCULAR | Status: AC
  Filled 2022-07-03: qty 10

## 2022-07-03 MED ORDER — FENTANYL CITRATE (PF) 100 MCG/2ML IJ SOLN
INTRAMUSCULAR | Status: AC
  Filled 2022-07-03: qty 2

## 2022-07-03 MED ORDER — FENTANYL CITRATE (PF) 100 MCG/2ML IJ SOLN
INTRAMUSCULAR | Status: DC | PRN
  Administered 2022-07-03: 25 ug via INTRAVENOUS

## 2022-07-03 MED ORDER — ACETAMINOPHEN 325 MG PO TABS: 650.0000 mg | ORAL_TABLET | ORAL | Status: DC | PRN

## 2022-07-03 MED ORDER — MIDAZOLAM HCL 2 MG/2ML IJ SOLN
INTRAMUSCULAR | Status: AC
  Filled 2022-07-03: qty 2

## 2022-07-03 MED ORDER — HYDRALAZINE HCL 20 MG/ML IJ SOLN: 10.0000 mg | INTRAMUSCULAR | Status: DC | PRN

## 2022-07-03 MED ORDER — IOHEXOL 350 MG/ML SOLN
INTRAVENOUS | Status: DC | PRN
  Administered 2022-07-03: 40 mL

## 2022-07-03 MED ORDER — SODIUM CHLORIDE 0.9 % IV SOLN: 250.0000 mL | INTRAVENOUS | Status: DC | PRN

## 2022-07-03 MED ORDER — SODIUM CHLORIDE 0.9 % WEIGHT BASED INFUSION
3.0000 mL/kg/h | INTRAVENOUS | Status: AC
  Administered 2022-07-03: 3 mL/kg/h via INTRAVENOUS

## 2022-07-03 SURGICAL SUPPLY — 9 items
CATH 5FR JL3.5 JR4 ANG PIG MP (CATHETERS) IMPLANT
DEVICE RAD COMP TR BAND LRG (VASCULAR PRODUCTS) IMPLANT
GLIDESHEATH SLEND SS 6F .021 (SHEATH) IMPLANT
GUIDEWIRE INQWIRE 1.5J.035X260 (WIRE) IMPLANT
INQWIRE 1.5J .035X260CM (WIRE) ×1
KIT HEART LEFT (KITS) ×1 IMPLANT
PACK CARDIAC CATHETERIZATION (CUSTOM PROCEDURE TRAY) ×1 IMPLANT
TRANSDUCER W/STOPCOCK (MISCELLANEOUS) ×1 IMPLANT
TUBING CIL FLEX 10 FLL-RA (TUBING) ×1 IMPLANT

## 2022-07-03 NOTE — Telephone Encounter (Addendum)
Cath result reviewed. Sent Cc'd chart message to Dr. Harrington Challenger inquiring whether we make any med changes at this time based on mildly elevated LVEDP. Patient recently seen for DOE, also has pulmonary scarring. It is noted that patient has had recent issues with mild CKD on labs with Cr 1.25-1.35. I will route this message in telephone call format so that we can follow reply in the chart.

## 2022-07-03 NOTE — Interval H&P Note (Signed)
History and Physical Interval Note:  07/03/2022 9:50 AM  Jeremy Lopez  has presented today for surgery, with the diagnosis of dyspnea on exertion.  The various methods of treatment have been discussed with the patient and family. After consideration of risks, benefits and other options for treatment, the patient has consented to  Procedure(s): LEFT HEART CATH AND CORONARY ANGIOGRAPHY (N/A) as a surgical intervention.  The patient's history has been reviewed, patient examined, no change in status, stable for surgery.  I have reviewed the patient's chart and labs.  Questions were answered to the patient's satisfaction.    Cath Lab Visit (complete for each Cath Lab visit)  Clinical Evaluation Leading to the Procedure:   ACS: No.  Non-ACS:    Anginal Classification: CCS II  Anti-ischemic medical therapy: Maximal Therapy (2 or more classes of medications)  Non-Invasive Test Results: No non-invasive testing performed  Prior CABG: No previous CABG       Jeremy Lopez [facility] 07/03/2022 9:50 AM

## 2022-07-04 MED ORDER — ENTRESTO 24-26 MG PO TABS: 1.0000 | ORAL_TABLET | Freq: Two times a day (BID) | ORAL | 11 refills | Status: DC

## 2022-07-04 NOTE — Telephone Encounter (Signed)
Called pt to relay msg from [facility].

## 2022-07-04 NOTE — Telephone Encounter (Signed)
Patient states he is returning call to discuss cath results.

## 2022-07-04 NOTE — Telephone Encounter (Signed)
I would switch off of diltiazem to Entresto 24/26.   Follow BP and symptoms    Consider then Jardiance with close follow up of renal functoin Want to get rid of dilt first

## 2022-07-04 NOTE — Telephone Encounter (Signed)
Spoke with pt and notified of medication changes. Pt voiced understanding

## 2022-07-04 NOTE — Telephone Encounter (Signed)
Please let pt know I reviewed is cath results with Dr. Harrington Challenger who suggests we switch him from diltiazem to [facility]. This is a BP medicine that also has some benefit of offloading additional fluid. Please d/c diltiazem and start Entresto 24/26mg  BID with repeat BMET in 1 week. Otherwise keep f/u as planned.

## 2022-07-11 ENCOUNTER — Ambulatory Visit: Payer: Medicare Other | Admitting: Pulmonary Disease

## 2022-07-18 NOTE — Progress Notes (Signed)
Pt had recent L heart cath.   No significant dz,   Stents open. Note fluid was up a little     Reviewed with P Martinique I would consider trial of Jardiance 10 mg    This will have him urinate a little more     Keep on other meds     Follow up BMET and BNP in 10 days

## 2022-07-19 ENCOUNTER — Telehealth: Payer: Self-pay

## 2022-07-19 NOTE — Telephone Encounter (Signed)
-----   Message from Fay Records, MD sent at 07/18/2022  6:50 AM EDT -----    ----- Message ----- From: Charlie Pitter, PA-C Sent: 07/03/2022  11:26 AM EDT To: Fay Records, MD  [facility] Nevin Bloodgood, this is the patient we discussed whom you suggested definitive cath. Given this cath finding with mildly elevated LVEDP and his recent DOE (which also may be somewhat pulm in nature), do you think any utility in short trial of diuresis? He did not look volume overloaded in clinic. Has some degree of CKD with a little higher Cr this year than prior at 1.2-1.3.  Thanks!  ----- Message ----- From: Martinique, Peter M, MD Sent: 07/03/2022  10:21 AM EDT To: Charlie Pitter, PA-C; Fay Records, MD; #

## 2022-07-19 NOTE — Telephone Encounter (Signed)
Per Dr Harrington Challenger:   Pt had recent L heart cath.   No significant dz,   Stents open. Note fluid was up a little     Reviewed with P Martinique I would consider trial of Jardiance 10 mg    This will have him urinate a little more     Keep on other meds     Follow up BMET and BNP in 10 days.  Pt agrees to taking the Jardiance and will let us know if he has any difficulty with cost.   He is seeing Melina Copa PA on 07/31/22 and will plan to have his labs on that date in [place].

## 2022-07-20 ENCOUNTER — Telehealth: Payer: Self-pay | Admitting: Internal Medicine

## 2022-07-20 MED ORDER — EMPAGLIFLOZIN 10 MG PO TABS: 10.0000 mg | ORAL_TABLET | Freq: Every day | ORAL | 11 refills | Status: DC

## 2022-07-20 NOTE — Telephone Encounter (Signed)
Patient would like to speak to a nurse about his medications.

## 2022-07-20 NOTE — Telephone Encounter (Signed)
Called patient back about message. Patient was confused on what he should be taking and what not to be taking. Discovered patient was still taking diltiazem, and had not started entresto. Patient will stop diltiazem and start entresto. Patient will start jardance 10 mg daily. He will pick up samples on Monday a the [facility]. Patient will get a 2 week free coupon card and we will give him information for patient assistance. Patient agreed to plan and will get lab work at his office visit with Melina Copa PA later this month.

## 2022-07-23 ENCOUNTER — Encounter: Payer: Self-pay | Admitting: Pulmonary Disease

## 2022-07-23 ENCOUNTER — Ambulatory Visit (INDEPENDENT_AMBULATORY_CARE_PROVIDER_SITE_OTHER): Payer: Medicare Other | Admitting: Pulmonary Disease

## 2022-07-23 VITALS — BP 130/80 | HR 72 | Temp 97.7°F | Ht 70.0 in | Wt 229.4 lb

## 2022-07-23 DIAGNOSIS — Z85118 Personal history of other malignant neoplasm of bronchus and lung: Secondary | ICD-10-CM

## 2022-07-23 DIAGNOSIS — Z87891 Personal history of nicotine dependence: Secondary | ICD-10-CM | POA: Diagnosis not present

## 2022-07-23 DIAGNOSIS — C3490 Malignant neoplasm of unspecified part of unspecified bronchus or lung: Secondary | ICD-10-CM | POA: Diagnosis not present

## 2022-07-23 DIAGNOSIS — Z789 Other specified health status: Secondary | ICD-10-CM | POA: Diagnosis not present

## 2022-07-23 NOTE — Patient Instructions (Signed)
Thank you for visiting Dr. Valeta Harms at [facility]. Today we recommend the following:  Continue Trelegy daily   Return in about 1 year (around 07/24/2023) for with APP or Dr. Valeta Harms.    Please do your part to reduce the spread of COVID-19.

## 2022-07-23 NOTE — Progress Notes (Signed)
Synopsis: Referred in February 2023 for history of lung cancer by Asencion Noble, MD  Subjective:   PATIENT ID: Jeremy Lopez GENDER: male DOB: 1951-07-07, MRN: [id number]  Chief Complaint  Patient presents with   Follow-up    Pt states he has been doing okay since last visit and denies any complaints.    This is a 71 year old gentleman, history of coronary disease, DES to LAD in 2010, essential hypertension, hyperlipidemia small cell lung cancer right upper lobe 2008.  Treatment ended in February 2009. For the past 10 years, and worse since this past September had recurrent bronchitis. Currently not really seeing any one regarding his lung ca. In September of 2022 had another episode of bronchitis treated with ABX. The congestion seems to be the biggest problem. He was treated with 3 separate rounds of abx. He has been using albuterol and Flonase. Former smoker, heavy smoker, quit at age 81,  starting smoking at age 6 years old.  His son has asthma and has been doing better on a maintenance inhaler.  OV 06/07/2022: Here today for follow-up.Last seen in the office by Roxan Diesel.  Office note reviewed.  Treating his seasonal allergic rhinitis symptoms.  On Xyzal.  For asthmatic bronchitis symptoms currently on Trelegy.  Has not had any more symptoms since going on Trelegy.  Doing really well with this.  Still having some shortness of breath with exertion.  He does talk to me today about alcohol use.  He is drinking 4 IPAs per day.  OV 07/23/2022: here today for follow up.  CT chest complete.  Follow-up imaging shows stability.  No lung nodules no evidence of recurrence of disease evidence of right upper lobe scarring with history of radiation treatments.  Evidence of emphysema.  Reviewed prior PFTs.  Wife present with him today in the office.  She is concerned about his ongoing alcohol use.  He also has some complaints of lower extremity numbness and tingling felt to be potentially related to  neuropathy.  He is on a follow-up with his primary care provider regarding this.  He is doing really well on the Trelegy feels like he is breathing better has not had any more respiratory symptoms since starting it.    Past Medical History:  Diagnosis Date   CAD (coronary artery disease), native coronary artery    DES proximal LAD 07/2009; DES to RCA 04/2020   Essential hypertension    Hyperlipidemia    Small cell lung cancer ([place])    Right upper lobe 2008; no evidence of disease since treatment ended in 11/2007     Family History  Problem Relation Age of Onset   Cancer Father    CAD Brother    Hypertension Brother      Past Surgical History:  Procedure Laterality Date   CORONARY ATHERECTOMY N/A 04/26/2020   Procedure: CORONARY ATHERECTOMY;  Surgeon: Jettie Booze, MD;  Location: [facility];  Service: Cardiovascular;  Laterality: N/A;   CORONARY STENT INTERVENTION N/A 04/26/2020   Procedure: CORONARY STENT INTERVENTION;  Surgeon: Jettie Booze, MD;  Location: [facility];  Service: Cardiovascular;  Laterality: N/A;   Insertion of left subclavian Port-A-Cath  2008   INTRAVASCULAR ULTRASOUND/IVUS N/A 04/26/2020   Procedure: Intravascular Ultrasound/IVUS;  Surgeon: Jettie Booze, MD;  Location: [facility];  Service: Cardiovascular;  Laterality: N/A;   LEFT HEART CATH AND CORONARY ANGIOGRAPHY N/A 07/03/2022   Procedure: LEFT HEART CATH AND CORONARY ANGIOGRAPHY;  Surgeon: Martinique, Peter M, MD;  Location: [facility];  Service: Cardiovascular;  Laterality: N/A;   RIGHT/LEFT HEART CATH AND CORONARY ANGIOGRAPHY N/A 04/26/2020   Procedure: RIGHT/LEFT HEART CATH AND CORONARY ANGIOGRAPHY;  Surgeon: Jettie Booze, MD;  Location: [facility];  Service: Cardiovascular;  Laterality: N/A;   TEMPORARY PACEMAKER N/A 04/26/2020   Procedure: TEMPORARY PACEMAKER;  Surgeon: Jettie Booze, MD;  Location: [facility];  Service:  Cardiovascular;  Laterality: N/A;   Video bronchoscopy and video mediastinoscopy  2008    Social History   Socioeconomic History   Marital status: Married    Spouse name: Not on file   Number of children: Not on file   Years of education: Not on file   Highest education level: Not on file  Occupational History   Occupation: Full time    Comment: Education officer, museum for [employer]  Tobacco Use   Smoking status: Former    Types: Cigarettes    Start date: 10/25/1966    Quit date: 10/25/1998    Years since quitting: 23.7   Smokeless tobacco: Never  Vaping Use   Vaping Use: Never used  Substance and Sexual Activity   Alcohol use: Yes    Alcohol/week: 14.0 standard drinks of alcohol    Types: 14 Cans of beer per week   Drug use: No   Sexual activity: Not on file  Other Topics Concern   Not on file  Social History Narrative   Not on file   Social Determinants of Health   Financial Resource Strain: Not on file  Food Insecurity: Not on file  Transportation Needs: Not on file  Physical Activity: Not on file  Stress: Not on file  Social Connections: Not on file  Intimate Partner Violence: Not on file     Allergies  Allergen Reactions   Cephalexin Anaphylaxis    throat swelling Patient said he can take amoxicillin   Amlodipine Swelling    Leg swelling     Outpatient Medications Prior to Visit  Medication Sig Dispense Refill   albuterol (VENTOLIN HFA) 108 (90 Base) MCG/ACT inhaler Inhale 2 puffs into the lungs every 6 (six) hours as needed for wheezing or shortness of breath.     benzonatate (TESSALON) 200 MG capsule Take 1 capsule (200 mg total) by mouth 3 (three) times daily as needed. 30 capsule 1   Cholecalciferol (VITAMIN D) 50 MCG (2000 UT) tablet Take 2,000 Units by mouth daily.     clopidogrel (PLAVIX) 75 MG tablet Take 1 tablet (75 mg total) by mouth daily with breakfast. 90 tablet 3   empagliflozin (JARDIANCE) 10 MG TABS tablet Take 1 tablet (10 mg total) by mouth daily  before breakfast. 30 tablet 11   fluticasone (FLONASE) 50 MCG/ACT nasal spray Place 2 sprays into both nostrils once a week.     Fluticasone-Umeclidin-Vilant (TRELEGY ELLIPTA) 100-62.5-25 MCG/ACT AEPB Inhale 1 puff into the lungs daily. 60 each 0   levocetirizine (XYZAL) 5 MG tablet Take 1 tablet (5 mg total) by mouth every evening. 30 tablet 5   metoprolol succinate (TOPROL-XL) 100 MG 24 hr tablet TAKE ONE TABLET (100MG  TOTAL) BY MOUTH DAILY. TAKE WITH OR IMMEDIATELY FOLLOWING A MEAL. 90 tablet 2   nitroGLYCERIN (NITROSTAT) 0.4 MG SL tablet Place 1 tablet (0.4 mg total) under the tongue every 5 (five) minutes as needed for chest pain. 25 tablet 2   rosuvastatin (CRESTOR) 40 MG tablet TAKE ONE (1) TABLET BY MOUTH EVERY  DAY 90 tablet 3   sacubitril-valsartan (ENTRESTO) 24-26 MG Take 1 tablet by mouth 2 (two) times daily. 60 tablet 11   Spacer/Aero-Holding Chambers DEVI Use with inhaler 1 each 2   Facility-Administered Medications Prior to Visit  Medication Dose Route Frequency Provider Last Rate Last Admin   sodium chloride flush (NS) 0.9 % injection 3 mL  3 mL Intravenous Q12H Dunn, Dayna N, PA-C        Review of Systems  Constitutional:  Negative for chills, fever, malaise/fatigue and weight loss.  HENT:  Negative for hearing loss, sore throat and tinnitus.   Eyes:  Negative for blurred vision and double vision.  Respiratory:  Negative for cough, hemoptysis, sputum production, shortness of breath, wheezing and stridor.   Cardiovascular:  Negative for chest pain, palpitations, orthopnea, leg swelling and PND.  Gastrointestinal:  Negative for abdominal pain, constipation, diarrhea, heartburn, nausea and vomiting.  Genitourinary:  Negative for dysuria, hematuria and urgency.  Musculoskeletal:  Negative for joint pain and myalgias.  Skin:  Negative for itching and rash.  Neurological:  Negative for dizziness, tingling, weakness and headaches.  Endo/Heme/Allergies:  Negative for environmental  allergies. Does not bruise/bleed easily.  Psychiatric/Behavioral:  Negative for depression. The patient is not nervous/anxious and does not have insomnia.   All other systems reviewed and are negative.    Objective:  Physical Exam Vitals reviewed.  Constitutional:      General: He is not in acute distress.    Appearance: He is well-developed.  HENT:     Head: Normocephalic and atraumatic.  Eyes:     General: No scleral icterus.    Conjunctiva/sclera: Conjunctivae normal.     Pupils: Pupils are equal, round, and reactive to light.  Neck:     Vascular: No JVD.     Trachea: No tracheal deviation.  Cardiovascular:     Rate and Rhythm: Normal rate and regular rhythm.     Heart sounds: Normal heart sounds. No murmur heard. Pulmonary:     Effort: Pulmonary effort is normal. No tachypnea, accessory muscle usage or respiratory distress.     Breath sounds: No stridor. No wheezing, rhonchi or rales.  Abdominal:     General: There is no distension.     Palpations: Abdomen is soft.     Tenderness: There is no abdominal tenderness.  Musculoskeletal:        General: No tenderness.     Cervical back: Neck supple.     Right lower leg: Edema present.     Left lower leg: No edema.  Lymphadenopathy:     Cervical: No cervical adenopathy.  Skin:    General: Skin is warm and dry.     Capillary Refill: Capillary refill takes less than 2 seconds.     Findings: No rash.  Neurological:     Mental Status: He is alert and oriented to person, place, and time.  Psychiatric:        Behavior: Behavior normal.      Vitals:   07/23/22 1053  BP: 130/80  Pulse: 72  Temp: 97.7 F (36.5 C)  TempSrc: Oral  SpO2: 96%  Weight: 229 lb 6.4 oz (104.1 kg)  Height: 5\' 10"  (1.778 m)   96% on RA BMI Readings from Last 3 Encounters:  07/23/22 32.92 kg/m  07/03/22 32.00 kg/m  06/12/22 32.71 kg/m   Wt Readings from Last 3 Encounters:  07/23/22 229 lb 6.4 oz (104.1 kg)  07/03/22 223 lb (101.2 kg)   06/12/22 228  lb (103.4 kg)     CBC    Component Value Date/Time   WBC 7.4 06/28/2022 1218   RBC 4.63 06/28/2022 1218   HGB 14.3 06/28/2022 1218   HGB 14.3 02/08/2021 1338   HGB 11.8 (L) 10/14/2007 1541   HCT 43.4 06/28/2022 1218   HCT 42.8 02/08/2021 1338   HCT 35.2 (L) 10/14/2007 1541   PLT 356 06/28/2022 1218   PLT 436 02/08/2021 1338   MCV 93.7 06/28/2022 1218   MCV 90 02/08/2021 1338   MCV 96.4 10/14/2007 1541   MCH 30.9 06/28/2022 1218   MCHC 32.9 06/28/2022 1218   RDW 14.2 06/28/2022 1218   RDW 13.8 02/08/2021 1338   RDW 20.5 (H) 10/14/2007 1541   LYMPHSABS 1.5 12/28/2021 1451   LYMPHSABS 1.5 01/04/2016 1611   LYMPHSABS 0.1 (L) 10/14/2007 1541   MONOABS 0.7 12/28/2021 1451   MONOABS 0.2 10/14/2007 1541   EOSABS 0.1 12/28/2021 1451   EOSABS 0.1 01/04/2016 1611   BASOSABS 0.1 12/28/2021 1451   BASOSABS 0.1 01/04/2016 1611   BASOSABS 0.0 10/14/2007 1541     Chest Imaging: July 2021 CTA chest: No evidence of PE, paramediastinal fibrosis with history of radiation.  Evidence of centrilobular emphysema. The patient's images have been independently reviewed by me.    September 2023 CT chest: Linear densities with air bronchograms in the right lung evidence of scarring related to previous radiation. The patient's images have been independently reviewed by me.     Pulmonary Functions Testing Results:    Latest Ref Rng & Units 01/23/2022    9:52 AM  PFT Results  FVC-Pre L 3.87   FVC-Predicted Pre % 87   FVC-Post L 3.96   FVC-Predicted Post % 89   Pre FEV1/FVC % % 70   Post FEV1/FCV % % 73   FEV1-Pre L 2.73   FEV1-Predicted Pre % 83   FEV1-Post L 2.87   DLCO uncorrected ml/min/mmHg 19.52   DLCO UNC% % 75   DLCO corrected ml/min/mmHg 19.52   DLCO COR %Predicted % 75   DLVA Predicted % 83   TLC L 6.30   TLC % Predicted % 89   RV % Predicted % 103    FeNO:   Pathology:   Echocardiogram:   Heart Catheterization:     Assessment & Plan:      ICD-10-CM   1. History of lung cancer  Z85.118     2. Former smoker  Z87.891     3. Alcohol use  Z78.9     4. Malignant neoplasm of lung, unspecified laterality, unspecified part of lung ([place])  C34.90       Discussion:  This is a 71 year old gentleman with recurrent bronchitis chronic bronchitis symptoms.  History of tobacco use, former smoker quit 20 years ago has a history of small cell lung cancer status post radiation and chemo.  History of coronary artery disease recent cardiac catheterization that was clear.  Does have daily alcohol use.  Plan: Multifactorial etiology of shortness of breath which is improved on Trelegy. Repeat noncontrasted CT scan of the chest stable. Patient was counseled on alcohol use. Going to follow-up with PCP regarding neuropathy symptoms. Return to clinic to see [place] in 1 year or as needed.     Current Outpatient Medications:    albuterol (VENTOLIN HFA) 108 (90 Base) MCG/ACT inhaler, Inhale 2 puffs into the lungs every 6 (six) hours as needed for wheezing or shortness of breath., Disp: , Rfl:  benzonatate (TESSALON) 200 MG capsule, Take 1 capsule (200 mg total) by mouth 3 (three) times daily as needed., Disp: 30 capsule, Rfl: 1   Cholecalciferol (VITAMIN D) 50 MCG (2000 UT) tablet, Take 2,000 Units by mouth daily., Disp: , Rfl:    clopidogrel (PLAVIX) 75 MG tablet, Take 1 tablet (75 mg total) by mouth daily with breakfast., Disp: 90 tablet, Rfl: 3   empagliflozin (JARDIANCE) 10 MG TABS tablet, Take 1 tablet (10 mg total) by mouth daily before breakfast., Disp: 30 tablet, Rfl: 11   fluticasone (FLONASE) 50 MCG/ACT nasal spray, Place 2 sprays into both nostrils once a week., Disp: , Rfl:    Fluticasone-Umeclidin-Vilant (TRELEGY ELLIPTA) 100-62.5-25 MCG/ACT AEPB, Inhale 1 puff into the lungs daily., Disp: 60 each, Rfl: 0   levocetirizine (XYZAL) 5 MG tablet, Take 1 tablet (5 mg total) by mouth every evening., Disp: 30 tablet, Rfl: 5   metoprolol  succinate (TOPROL-XL) 100 MG 24 hr tablet, TAKE ONE TABLET (100MG  TOTAL) BY MOUTH DAILY. TAKE WITH OR IMMEDIATELY FOLLOWING A MEAL., Disp: 90 tablet, Rfl: 2   nitroGLYCERIN (NITROSTAT) 0.4 MG SL tablet, Place 1 tablet (0.4 mg total) under the tongue every 5 (five) minutes as needed for chest pain., Disp: 25 tablet, Rfl: 2   rosuvastatin (CRESTOR) 40 MG tablet, TAKE ONE (1) TABLET BY MOUTH EVERY DAY, Disp: 90 tablet, Rfl: 3   sacubitril-valsartan (ENTRESTO) 24-26 MG, Take 1 tablet by mouth 2 (two) times daily., Disp: 60 tablet, Rfl: 11   Spacer/Aero-Holding Chambers DEVI, Use with inhaler, Disp: 1 each, Rfl: 2  Current Facility-Administered Medications:    sodium chloride flush (NS) 0.9 % injection 3 mL, 3 mL, Intravenous, Q12H, Dunn, Nedra Hai, PA-C   Garner Nash, DO [facility] 07/23/2022 11:16 AM

## 2022-07-30 ENCOUNTER — Encounter: Payer: Self-pay | Admitting: Physician Assistant

## 2022-07-30 NOTE — Progress Notes (Signed)
Cardiology Office Note    Date:  07/31/2022   ID:  Jeremy Lopez, DOB 04-Nov-1950, MRN [id number]  PCP:  Asencion Noble, MD  Cardiologist:  Dorris Carnes, MD  Electrophysiologist:  None   Chief Complaint: f/u cath  History of Present Illness:   Jeremy Lopez is a 71 y.o. male with history of CAD s/p DES to prox LAD in 2010, DES to [facility] 04/2020, HTN, HLD, suspected HFpEF, possible CKD stage 2, remote lung CA (tx with radiation/chemo), habitual ETOH intake, former tobacco abuse, prior dependent edema (no longer on amlodipine) who is seen for follow-up.   His first stent was prompted by chest pain that felt like elephant sitting on his chest. In 2021 he developed DOE and chest tightness. Stress test was nonischemic but cath was pursued due to persistent symptoms resulting in PCI. EF was normal at that time. Of note he also had had a CTA around that time showing radiation fibrosis. He does feel that PCI improved his symptoms. He also follows with pulmonology. He was recently seen back in clinic 06/2022 with some progression of dyspnea over the previous 6 months. Labs were unrevealing. Echo showed EF 60-65%, G1DD, normal RV. Cardiac cath was pursued showing nonobstructive disease in the RCA and otherwise patent stents, mildly elevated LVEDP. Dr. Harrington Challenger recommended switching diltiazem to [facility]. In a later phone note, she also added Jardiance. He also has followed up with pulmonology who felt recent CT showed stability, +emphysema + radiation scarring. Concern for excess ETOH was also discussed at that visit.  He returns for follow-up today overall doing well. Though he did not notice a difference in his breathing with the med changes above, he did notice reduction in leg swelling and overall feeling better. However, on Sunday he noticed his BP was elevated to 160s by home cuff so went back to taking diltiazem instead of Entresto. He states he wasn't quite sure what we wanted him to do with this medicine. His  father did have congestive heart failure as well.  Labwork independently reviewed: 06/2022 K 4.0, Cr 1.25, CBC ok, LFTs ok, BNP wnl, LDL 40, trig 110, TSH wnl  Cardiology Studies:   Studies reviewed are outlined and summarized above. Reports included below if pertinent.   Cath 07/03/22   Mid RCA to Dist RCA lesion is 40% stenosed.   Mid RCA lesion is 30% stenosed.   Non-stenotic Prox LAD lesion was previously treated.   LV end diastolic pressure is mildly elevated.   Nonobstructive CAD. Prior stents in the LAD and RCA are patent Mildly elevated LVEDP 17 mm Hg   Plan: medical management.   Echo 06/14/22    1. Left ventricular ejection fraction, by estimation, is 60 to 65%. The  left ventricle has normal function. The left ventricle has no regional  wall motion abnormalities. Left ventricular diastolic parameters are  consistent with Grade I diastolic  dysfunction (impaired relaxation). The average left ventricular global  longitudinal strain is -17.7 %. The global longitudinal strain is normal.   2. Right ventricular systolic function is normal. The right ventricular  size is normal. Tricuspid regurgitation signal is inadequate for assessing  PA pressure.   3. The mitral valve is grossly normal, mildly calcified. Trivial mitral  valve regurgitation.   4. The aortic valve is tricuspid. Aortic valve regurgitation is not  visualized.   5. The inferior vena cava is normal in size with greater than 50%  respiratory variability, suggesting right atrial pressure of  3 mmHg.   Comparison(s): No prior Echocardiogram.     Past Medical History:  Diagnosis Date   CAD (coronary artery disease), native coronary artery    DES proximal LAD 07/2009; DES to RCA 04/2020   Essential hypertension    Habitual alcohol use    Hyperlipidemia    Small cell lung cancer ([place])    Right upper lobe 2008; no evidence of disease since treatment ended in 11/2007    Past Surgical History:  Procedure  Laterality Date   CORONARY ATHERECTOMY N/A 04/26/2020   Procedure: CORONARY ATHERECTOMY;  Surgeon: Jettie Booze, MD;  Location: [place] [facility];  Service: Cardiovascular;  Laterality: N/A;   CORONARY STENT INTERVENTION N/A 04/26/2020   Procedure: CORONARY STENT INTERVENTION;  Surgeon: Jettie Booze, MD;  Location: [place] [facility];  Service: Cardiovascular;  Laterality: N/A;   Insertion of left subclavian Port-A-Cath  2008   INTRAVASCULAR ULTRASOUND/IVUS N/A 04/26/2020   Procedure: Intravascular Ultrasound/IVUS;  Surgeon: Jettie Booze, MD;  Location: [place] [facility];  Service: Cardiovascular;  Laterality: N/A;   LEFT HEART CATH AND CORONARY ANGIOGRAPHY N/A 07/03/2022   Procedure: LEFT HEART CATH AND CORONARY ANGIOGRAPHY;  Surgeon: Martinique, Peter M, MD;  Location: [place] [facility];  Service: Cardiovascular;  Laterality: N/A;   RIGHT/LEFT HEART CATH AND CORONARY ANGIOGRAPHY N/A 04/26/2020   Procedure: RIGHT/LEFT HEART CATH AND CORONARY ANGIOGRAPHY;  Surgeon: Jettie Booze, MD;  Location: [place] [facility];  Service: Cardiovascular;  Laterality: N/A;   TEMPORARY PACEMAKER N/A 04/26/2020   Procedure: TEMPORARY PACEMAKER;  Surgeon: Jettie Booze, MD;  Location: [place] [facility];  Service: Cardiovascular;  Laterality: N/A;   Video bronchoscopy and video mediastinoscopy  2008    Current Medications: Current Meds  Medication Sig   albuterol (VENTOLIN HFA) 108 (90 Base) MCG/ACT inhaler Inhale 2 puffs into the lungs every 6 (six) hours as needed for wheezing or shortness of breath.   Cholecalciferol (VITAMIN D) 50 MCG (2000 UT) tablet Take 2,000 Units by mouth daily.   clopidogrel (PLAVIX) 75 MG tablet Take 1 tablet (75 mg total) by mouth daily with breakfast.   diltiazem (DILACOR XR) 240 MG 24 hr capsule Take 240 mg by mouth daily.   empagliflozin (JARDIANCE) 10 MG TABS tablet Take 1 tablet (10 mg total) by mouth daily before breakfast.   fluticasone  (FLONASE) 50 MCG/ACT nasal spray Place 2 sprays into both nostrils once a week.   Fluticasone-Umeclidin-Vilant (TRELEGY ELLIPTA) 100-62.5-25 MCG/ACT AEPB Inhale 1 puff into the lungs daily.   metoprolol succinate (TOPROL-XL) 100 MG 24 hr tablet TAKE ONE TABLET (100MG  TOTAL) BY MOUTH DAILY. TAKE WITH OR IMMEDIATELY FOLLOWING A MEAL.   nitroGLYCERIN (NITROSTAT) 0.4 MG SL tablet Place 1 tablet (0.4 mg total) under the tongue every 5 (five) minutes as needed for chest pain.   rosuvastatin (CRESTOR) 40 MG tablet TAKE ONE (1) TABLET BY MOUTH EVERY DAY   sacubitril-valsartan (ENTRESTO) 24-26 MG Take 1 tablet by mouth 2 (two) times daily.   Spacer/Aero-Holding Dorise Bullion Use with inhaler    Allergies:   Cephalexin and Amlodipine   Social History   Socioeconomic History   Marital status: Married    Spouse name: Not on file   Number of children: Not on file   Years of education: Not on file   Highest education level: Not on file  Occupational History   Occupation: Full time    Comment: Education officer, museum for [employer]  Tobacco Use  Smoking status: Former    Types: Cigarettes    Start date: 10/25/1966    Quit date: 10/25/1998    Years since quitting: 23.7   Smokeless tobacco: Never  Vaping Use   Vaping Use: Never used  Substance and Sexual Activity   Alcohol use: Yes    Alcohol/week: 14.0 standard drinks of alcohol    Types: 14 Cans of beer per week   Drug use: No   Sexual activity: Not on file  Other Topics Concern   Not on file  Social History Narrative   Not on file   Social Determinants of Health   Financial Resource Strain: Not on file  Food Insecurity: Not on file  Transportation Needs: Not on file  Physical Activity: Not on file  Stress: Not on file  Social Connections: Not on file     Family History:  The patient's family history includes CAD in his brother; Cancer in his father; Hypertension in his brother.  ROS:   Please see the history of present illness.  All  other systems are reviewed and otherwise negative.    EKG(s)/Additional Labs   EKG:  EKG is not ordered today  Recent Labs: 06/13/2022: ALT 21; B Natriuretic Peptide 54.0; TSH 2.578 06/28/2022: BUN 20; Creatinine, Ser 1.25; Hemoglobin 14.3; Platelets 356; Potassium 4.0; Sodium 140  Recent Lipid Panel    Component Value Date/Time   CHOL 113 06/13/2022 1458   CHOL 120 01/04/2016 1611   TRIG 110 06/13/2022 1458   HDL 51 06/13/2022 1458   HDL 47 01/04/2016 1611   CHOLHDL 2.2 06/13/2022 1458   VLDL 22 06/13/2022 1458   LDLCALC 40 06/13/2022 1458   LDLCALC 61 12/28/2019 1501    PHYSICAL EXAM:    VS:  BP 136/72   Pulse 79   Ht 5\' 10"  (1.778 m)   Wt 226 lb (102.5 kg)   SpO2 94%   BMI 32.43 kg/m   BMI: Body mass index is 32.43 kg/m.  GEN: Well nourished, well developed male in no acute distress HEENT: normocephalic, atraumatic Neck: no JVD, carotid bruits, or masses Cardiac: RRR; no murmurs, rubs, or gallops, no edema  Respiratory:  clear to auscultation bilaterally, normal work of breathing GI: soft, nontender, nondistended, + BS MS: no deformity or atrophy Skin: warm and dry, no rash, right radial cath site without hematoma or ecchymosis; good pulse. Neuro:  Alert and Oriented x 3, Strength and sensation are intact, follows commands Psych: euthymic mood, full affect  Wt Readings from Last 3 Encounters:  07/31/22 226 lb (102.5 kg)  07/23/22 229 lb 6.4 oz (104.1 kg)  07/03/22 223 lb (101.2 kg)     ASSESSMENT & PLAN:   1. CAD - recent cath reviewed as above, stable. He has been maintained on clopidogrel monotherapy per Dr. Harrington Challenger, no longer on ASA. Continue metoprolol and rosuvastatin. LDL at goal by last check.  2. Possible chronic HFpEF - after last cath, per d/w Dr. Harrington Challenger, she had recommended to stop diltiazem and start Entresto + Jardiance. He does report on Sunday he took his last dose of Entresto and went back to diltiazem instead through today due to elevated BP. He has  been doing the [place]. It could be that his dose of Entresto was not equivalent enough to the dose of diltiazem to maintain normal BP. He reports a hx of PVCs but otherwise denies any significant arrhythmia history that warranted diltiazem (like SVT or PAF). If they resurface, we can always adjust his  therapy back. We'll recheck his BMET today to help guide whether OK to titrate Entresto or if we need to use in some combination with the diltiazem.   3. Essential HTN - follow BP with anticipated med changes above. We'll provide guidance on when to get back with [place] when his labs are back. He is able to follow BP at home. Of note, his labs are suggestive of CKD stage 2 so plan to keep a close eye on this.  4. Hyperlipidemia - continue statin. Recent LDL at goal.    Disposition: F/u with me or Dr. Harrington Challenger in 4 months.   Medication Adjustments/Labs and Tests Ordered: Current medicines are reviewed at length with the patient today.  Concerns regarding medicines are outlined above. Medication changes, Labs and Tests ordered today are summarized above and listed in the Patient Instructions accessible in Encounters.    Signed, Charlie Pitter, PA-C  07/31/2022 3:37 PM    [facility] Location in [place]. [place], [place] [address] Ph: [phone]; Fax [phone]

## 2022-07-31 ENCOUNTER — Ambulatory Visit: Payer: Medicare Other | Attending: Physician Assistant | Admitting: Physician Assistant

## 2022-07-31 ENCOUNTER — Encounter: Payer: Self-pay | Admitting: Physician Assistant

## 2022-07-31 VITALS — BP 136/72 | HR 79 | Ht 70.0 in | Wt 226.0 lb

## 2022-07-31 DIAGNOSIS — I2510 Atherosclerotic heart disease of native coronary artery without angina pectoris: Secondary | ICD-10-CM | POA: Insufficient documentation

## 2022-07-31 DIAGNOSIS — I5032 Chronic diastolic (congestive) heart failure: Secondary | ICD-10-CM | POA: Diagnosis not present

## 2022-07-31 DIAGNOSIS — I10 Essential (primary) hypertension: Secondary | ICD-10-CM | POA: Diagnosis not present

## 2022-07-31 DIAGNOSIS — E785 Hyperlipidemia, unspecified: Secondary | ICD-10-CM | POA: Insufficient documentation

## 2022-07-31 NOTE — Patient Instructions (Signed)
Medication Instructions:  Your physician recommends that you continue on your current medications as directed. Please refer to the Current Medication list given to you today.  *If you need a refill on your cardiac medications before your next appointment, please call your pharmacy*   Lab Work: Your physician recommends that you return for lab work in: Today   If you have labs (blood work) drawn today and your tests are completely normal, you will receive your results only by: MyChart Message (if you have MyChart) OR A paper copy in the mail If you have any lab test that is abnormal or we need to change your treatment, we will call you to review the results.   Testing/Procedures: NONE    Follow-Up: At [facility], you and your health needs are our priority.  As part of our continuing mission to provide you with exceptional heart care, we have created designated Provider Care Teams.  These Care Teams include your primary Cardiologist (physician) and Advanced Practice Providers (APPs -  Physician Assistants and Nurse Practitioners) who all work together to provide you with the care you need, when you need it.  We recommend signing up for the patient portal called "MyChart".  Sign up information is provided on this After Visit Summary.  MyChart is used to connect with patients for Virtual Visits (Telemedicine).  Patients are able to view lab/test results, encounter notes, upcoming appointments, etc.  Non-urgent messages can be sent to your provider as well.   To learn more about what you can do with MyChart, go to [email or web address].    Your next appointment:   4 month(s)  The format for your next appointment:   In Person  Provider:   You may see Dorris Carnes, MD or one of the following Advanced Practice Providers on your designated Care Team:   Bernerd Pho, PA-C  Ermalinda Barrios, PA-C     Other Instructions Thank you for choosing [facility]!    Important Information About [place]

## 2022-08-07 DIAGNOSIS — J449 Chronic obstructive pulmonary disease, unspecified: Secondary | ICD-10-CM | POA: Diagnosis not present

## 2022-08-07 DIAGNOSIS — I10 Essential (primary) hypertension: Secondary | ICD-10-CM | POA: Diagnosis not present

## 2022-09-06 DIAGNOSIS — I10 Essential (primary) hypertension: Secondary | ICD-10-CM | POA: Diagnosis not present

## 2022-09-06 DIAGNOSIS — J449 Chronic obstructive pulmonary disease, unspecified: Secondary | ICD-10-CM | POA: Diagnosis not present

## 2022-10-07 DIAGNOSIS — J449 Chronic obstructive pulmonary disease, unspecified: Secondary | ICD-10-CM | POA: Diagnosis not present

## 2022-10-07 DIAGNOSIS — I10 Essential (primary) hypertension: Secondary | ICD-10-CM | POA: Diagnosis not present

## 2022-10-19 ENCOUNTER — Other Ambulatory Visit: Payer: Self-pay | Admitting: *Deleted

## 2022-10-19 MED ORDER — TRELEGY ELLIPTA 100-62.5-25 MCG/ACT IN AEPB: 1.0000 | INHALATION_SPRAY | Freq: Every day | RESPIRATORY_TRACT | 3 refills | Status: DC

## 2022-11-05 ENCOUNTER — Telehealth: Payer: Self-pay | Admitting: Internal Medicine

## 2022-11-05 MED ORDER — DILTIAZEM HCL ER 240 MG PO CP24: 240.0000 mg | ORAL_CAPSULE | Freq: Every day | ORAL | 3 refills | Status: DC

## 2022-11-05 NOTE — Telephone Encounter (Signed)
Patient did not realize he should be taking diltiazem along with his [place].He ran out a month ago.I e-scribed new refill to [facility]. He will call me back in a week with BP readings.

## 2022-11-05 NOTE — Telephone Encounter (Signed)
Pt c/o BP issue: STAT if pt c/o blurred vision, one-sided weakness or slurred speech  1. What are your last 5 BP readings?  140/93  140/90  140/95  2. Are you having any other symptoms (ex. Dizziness, headache, blurred vision, passed out)? Headache off and on for the past week  3. What is your BP issue? Patient states there have been med changes and he believes this may be the cause of higher bp. Requesting call back.

## 2022-11-06 DIAGNOSIS — I10 Essential (primary) hypertension: Secondary | ICD-10-CM | POA: Diagnosis not present

## 2022-11-06 DIAGNOSIS — J449 Chronic obstructive pulmonary disease, unspecified: Secondary | ICD-10-CM | POA: Diagnosis not present

## 2022-11-18 NOTE — Progress Notes (Unsigned)
Cardiology Office Note   Date:  11/19/2022   ID:  Jeremy Lopez, DOB 1951/03/08, MRN [id number]  PCP:  Asencion Noble, MD  Cardiologist:   Dorris Carnes, MD    Patient presents for f/u of CAD    History of Present Illness: Jeremy Lopez is a 72 y.o. male with a history of  CAD   He is s/p PTCA/DES in 2010, PTCA/DES to m RCA in July 2021.  He also has a hx of HTN, HL, PFpEF, CKD, remote lung CA (rx chemo/xrt), EtOH, remote tobacco   In June 2021 he complained of chest pressure   Myovue was done and showed no ischemia   The pt continued to have CP, giving out with activity, SOB   CT of chest showed radiation fibrosis    With continued worsened symptoms,  he went on to [facility] in July 2021  LHC showed 99% mid RCA lesion  PT  underwent atherectomy with DES placment.   The pt also had a patent Prox LAD stent.   LVEF 55 to 65%  In Sept 2023 had increased DOE    Echo showed LVEF 60 to 65%, G1 DD, normal RV  LHC showed nonobstrucitve CAD, patent stents.   LVEDP mildly elevated.    Dilt switched to [facility].   Pt also started on jardiance  Seen in pulmonary  CT showed stable emphysema.    Seen by Royden Purl in October 2023   Switched back to diltiazem    Since seen he denies CP  Breathing is OK   No dizziness    The pt resumed Dilt 240 just a few wks ago   BP still elevated    SBP at home 160s/    Has been taking Nyquil    Not on plavix          Current Meds  Medication Sig   albuterol (VENTOLIN HFA) 108 (90 Base) MCG/ACT inhaler Inhale 2 puffs into the lungs every 6 (six) hours as needed for wheezing or shortness of breath.   Cholecalciferol (VITAMIN D) 50 MCG (2000 UT) tablet Take 2,000 Units by mouth daily.   diltiazem (DILACOR XR) 240 MG 24 hr capsule Take 1 capsule (240 mg total) by mouth daily.   doxycycline (VIBRAMYCIN) 100 MG capsule Take 100 mg by mouth 2 (two) times daily.   empagliflozin (JARDIANCE) 10 MG TABS tablet Take 1 tablet (10 mg total) by mouth daily before breakfast.   fluticasone  (FLONASE) 50 MCG/ACT nasal spray Place 2 sprays into both nostrils once a week.   Fluticasone-Umeclidin-Vilant (TRELEGY ELLIPTA) 100-62.5-25 MCG/ACT AEPB Inhale 1 puff into the lungs daily.   metoprolol succinate (TOPROL-XL) 100 MG 24 hr tablet TAKE ONE TABLET (100MG  TOTAL) BY MOUTH DAILY. TAKE WITH OR IMMEDIATELY FOLLOWING A MEAL.   nitroGLYCERIN (NITROSTAT) 0.4 MG SL tablet Place 1 tablet (0.4 mg total) under the tongue every 5 (five) minutes as needed for chest pain.   rosuvastatin (CRESTOR) 40 MG tablet TAKE ONE (1) TABLET BY MOUTH EVERY DAY   sacubitril-valsartan (ENTRESTO) 24-26 MG Take 1 tablet by mouth 2 (two) times daily.   Spacer/Aero-Holding Dorise Bullion Use with inhaler   Current Facility-Administered Medications for the 11/19/22 encounter (Office Visit) with Fay Records, MD  Medication   sodium chloride flush (NS) 0.9 % injection 3 mL     Allergies:   Cephalexin and Amlodipine   Past Medical History:  Diagnosis Date   CAD (coronary artery disease), native coronary artery  DES proximal LAD 07/2009; DES to RCA 04/2020   Essential hypertension    Habitual alcohol use    Hyperlipidemia    Small cell lung cancer ([place])    Right upper lobe 2008; no evidence of disease since treatment ended in 11/2007    Past Surgical History:  Procedure Laterality Date   CORONARY ATHERECTOMY N/A 04/26/2020   Procedure: CORONARY ATHERECTOMY;  Surgeon: Jettie Booze, MD;  Location: [place] [facility];  Service: Cardiovascular;  Laterality: N/A;   CORONARY STENT INTERVENTION N/A 04/26/2020   Procedure: CORONARY STENT INTERVENTION;  Surgeon: Jettie Booze, MD;  Location: [place] [facility];  Service: Cardiovascular;  Laterality: N/A;   Insertion of left subclavian Port-A-Cath  2008   INTRAVASCULAR ULTRASOUND/IVUS N/A 04/26/2020   Procedure: Intravascular Ultrasound/IVUS;  Surgeon: Jettie Booze, MD;  Location: [place] [facility];  Service: Cardiovascular;  Laterality: N/A;    LEFT HEART CATH AND CORONARY ANGIOGRAPHY N/A 07/03/2022   Procedure: LEFT HEART CATH AND CORONARY ANGIOGRAPHY;  Surgeon: Martinique, Peter M, MD;  Location: [place] [facility];  Service: Cardiovascular;  Laterality: N/A;   RIGHT/LEFT HEART CATH AND CORONARY ANGIOGRAPHY N/A 04/26/2020   Procedure: RIGHT/LEFT HEART CATH AND CORONARY ANGIOGRAPHY;  Surgeon: Jettie Booze, MD;  Location: [place] [facility];  Service: Cardiovascular;  Laterality: N/A;   TEMPORARY PACEMAKER N/A 04/26/2020   Procedure: TEMPORARY PACEMAKER;  Surgeon: Jettie Booze, MD;  Location: [place] [facility];  Service: Cardiovascular;  Laterality: N/A;   Video bronchoscopy and video mediastinoscopy  2008     Social History:  The patient  reports that he quit smoking about 24 years ago. His smoking use included cigarettes. He started smoking about 56 years ago. He has never used smokeless tobacco. He reports current alcohol use of about 14.0 standard drinks of alcohol per week. He reports that he does not use drugs.   Family History:  The patient's family history includes CAD in his brother; Cancer in his father; Hypertension in his brother.    ROS:  Please see the history of present illness. All other systems are reviewed and  Negative to the above problem except as noted.    PHYSICAL EXAM: VS:  BP (!) 160/94   Pulse 69   Ht 5\' 10"  (1.778 m)   Wt 220 lb 3.2 oz (99.9 kg)   SpO2 98%   BMI 31.60 kg/m   GEN: Obese 72 yo  in no acute distress  HEENT: normal  Neck: no JVD, no carotid bruit Cardiac: RRR; no murmurs;  No signif LE edema  Respiratory:  CTA GI: soft, nontender, obese    MS: no deformity Moving all extremities   Skin: warm and dry,  Sl erythema Neuro:  Strength and sensation are intact Psych: euthymic mood, full affect   EKG:  EKG is not done today    LHC   Sept 2023    Mid RCA to Dist RCA lesion is 40% stenosed.   Mid RCA lesion is 30% stenosed.   Non-stenotic Prox LAD lesion was previously  treated.   LV end diastolic pressure is mildly elevated.   Nonobstructive CAD. Prior stents in the LAD and RCA are patent Mildly elevated LVEDP 17 mm Hg   Echo 2023   1. Left ventricular ejection fraction, by estimation, is 60 to 65%. The  left ventricle has normal function. The left ventricle has no regional  wall motion abnormalities. Left ventricular diastolic parameters are  consistent with Grade  I diastolic  dysfunction (impaired relaxation). The average left ventricular global  longitudinal strain is -17.7 %. The global longitudinal strain is normal.   2. Right ventricular systolic function is normal. The right ventricular  size is normal. Tricuspid regurgitation signal is inadequate for assessing  PA pressure.   3. The mitral valve is grossly normal, mildly calcified. Trivial mitral  valve regurgitation.   4. The aortic valve is tricuspid. Aortic valve regurgitation is not  visualized.   5. The inferior vena cava is normal in size with greater than 50%  respiratory variability, suggesting right atrial pressure of 3 mmHg.    CARDIAC CATH   04/26/20   Mid RCA lesion is 99% stenosed, heavily calcified. Following atherectomy, A drug-eluting stent was successfully placed using a STENT RESOLUTE ONYX 3.5X15, postdilated to 3.75 mm; stent optimized with IVUS. Post intervention, there is a 0% residual stenosis. Mid RCA to Dist RCA lesion is 40% stenosed. Previously placed Prox LAD stent, is widely patent. The left ventricular systolic function is normal. LV end diastolic pressure is normal. The left ventricular ejection fraction is 55-65% by visual estimate. There is no aortic valve stenosis.   Continue aggressive secondary prevention.  Still eligible for same-day discharge.   Consider Plavix monotherapy given his diffuse disease after DAPT is completed.    Chest CT  04/21/20   No pulmonary emboli.   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   Aortic Atherosclerosis (ICD10-I70.0). Coronary artery calcification.  Lipid Panel    Component Value Date/Time   CHOL 113 06/13/2022 1458   CHOL 120 01/04/2016 1611   TRIG 110 06/13/2022 1458   HDL 51 06/13/2022 1458   HDL 47 01/04/2016 1611   CHOLHDL 2.2 06/13/2022 1458   VLDL 22 06/13/2022 1458   LDLCALC 40 06/13/2022 1458   LDLCALC 61 12/28/2019 1501      Wt Readings from Last 3 Encounters:  11/19/22 220 lb 3.2 oz (99.9 kg)  07/31/22 226 lb (102.5 kg)  07/23/22 229 lb 6.4 oz (104.1 kg)      ASSESSMENT AND PLAN:  1.  CAD  Recent cath in Sept 2023 showed no obstructive CAD   Resume Plavix  2  HTN  BP is elevated   I would double Entresto   Keep on diltiazem     Pt will MyChart response in a few wks     Plan for follow up if OK in Summer 2024     3  HL   Continue statin   LDL 40 in Sept 2023   4   Wt  I have discussed in past    WFPB diet      F/U in July 2024    Current medicines are reviewed at length with the patient today.  The patient does not have concerns regarding medicines.  Signed, Dorris Carnes, MD  11/19/2022 1:49 PM     [facility] [place], [place], [place]  [address] Phone: [phone]; Fax: [phone]

## 2022-11-19 ENCOUNTER — Ambulatory Visit: Payer: HMO | Attending: Internal Medicine | Admitting: Internal Medicine

## 2022-11-19 ENCOUNTER — Encounter: Payer: Self-pay | Admitting: Internal Medicine

## 2022-11-19 VITALS — BP 160/94 | HR 69 | Ht 70.0 in | Wt 220.2 lb

## 2022-11-19 DIAGNOSIS — I10 Essential (primary) hypertension: Secondary | ICD-10-CM

## 2022-11-19 DIAGNOSIS — Z79899 Other long term (current) drug therapy: Secondary | ICD-10-CM

## 2022-11-19 MED ORDER — ENTRESTO 49-51 MG PO TABS: 1.0000 | ORAL_TABLET | Freq: Two times a day (BID) | ORAL | 11 refills | Status: DC

## 2022-11-19 MED ORDER — CLOPIDOGREL BISULFATE 75 MG PO TABS: 75.0000 mg | ORAL_TABLET | Freq: Every day | ORAL | 3 refills | Status: DC

## 2022-11-19 NOTE — Patient Instructions (Addendum)
Medication Instructions:   Increase Entresto to 49/51 mg Two Times Daily ( You have been given samples today Lot# [id number], Exp: 05/2024)   Restart Plavix 75 mg Daily   *If you need a refill on your cardiac medications before your next appointment, please call your pharmacy*   Lab Work: Your physician recommends that you return for lab work in: Today   If you have labs (blood work) drawn today and your tests are completely normal, you will receive your results only by: [place] (if you have MyChart) OR A paper copy in the mail If you have any lab test that is abnormal or we need to change your treatment, we will call you to review the results.   Testing/Procedures: NONE    Follow-Up: At [facility], you and your health needs are our priority.  As part of our continuing mission to provide you with exceptional heart care, we have created designated Provider Care Teams.  These Care Teams include your primary Cardiologist (physician) and Advanced Practice Providers (APPs -  Physician Assistants and Nurse Practitioners) who all work together to provide you with the care you need, when you need it.  We recommend signing up for the patient portal called "MyChart".  Sign up information is provided on this After Visit Summary.  MyChart is used to connect with patients for Virtual Visits (Telemedicine).  Patients are able to view lab/test results, encounter notes, upcoming appointments, etc.  Non-urgent messages can be sent to your provider as well.   To learn more about what you can do with MyChart, go to [email or web address].    Your next appointment:    July   Provider:   Dorris Carnes, MD    Other Instructions Thank you for choosing [facility]!

## 2022-12-19 ENCOUNTER — Other Ambulatory Visit: Payer: Self-pay | Admitting: Internal Medicine

## 2023-01-14 ENCOUNTER — Ambulatory Visit: Payer: HMO | Admitting: Internal Medicine

## 2023-01-18 ENCOUNTER — Encounter: Payer: Self-pay | Admitting: Internal Medicine

## 2023-01-18 ENCOUNTER — Ambulatory Visit (INDEPENDENT_AMBULATORY_CARE_PROVIDER_SITE_OTHER): Payer: HMO | Admitting: Internal Medicine

## 2023-01-18 VITALS — BP 138/80 | HR 64 | Ht 70.0 in | Wt 220.8 lb

## 2023-01-18 DIAGNOSIS — E782 Mixed hyperlipidemia: Secondary | ICD-10-CM | POA: Diagnosis not present

## 2023-01-18 DIAGNOSIS — C3490 Malignant neoplasm of unspecified part of unspecified bronchus or lung: Secondary | ICD-10-CM | POA: Diagnosis not present

## 2023-01-18 DIAGNOSIS — R208 Other disturbances of skin sensation: Secondary | ICD-10-CM

## 2023-01-18 DIAGNOSIS — Z131 Encounter for screening for diabetes mellitus: Secondary | ICD-10-CM | POA: Diagnosis not present

## 2023-01-18 DIAGNOSIS — I2510 Atherosclerotic heart disease of native coronary artery without angina pectoris: Secondary | ICD-10-CM | POA: Diagnosis not present

## 2023-01-18 DIAGNOSIS — Z1321 Encounter for screening for nutritional disorder: Secondary | ICD-10-CM

## 2023-01-18 DIAGNOSIS — R202 Paresthesia of skin: Secondary | ICD-10-CM

## 2023-01-18 DIAGNOSIS — Z1329 Encounter for screening for other suspected endocrine disorder: Secondary | ICD-10-CM | POA: Diagnosis not present

## 2023-01-18 DIAGNOSIS — I509 Heart failure, unspecified: Secondary | ICD-10-CM

## 2023-01-18 DIAGNOSIS — J410 Simple chronic bronchitis: Secondary | ICD-10-CM

## 2023-01-18 DIAGNOSIS — I10 Essential (primary) hypertension: Secondary | ICD-10-CM | POA: Diagnosis not present

## 2023-01-18 DIAGNOSIS — R200 Anesthesia of skin: Secondary | ICD-10-CM

## 2023-01-18 DIAGNOSIS — Z0001 Encounter for general adult medical examination with abnormal findings: Secondary | ICD-10-CM | POA: Diagnosis not present

## 2023-01-18 DIAGNOSIS — J4530 Mild persistent asthma, uncomplicated: Secondary | ICD-10-CM | POA: Diagnosis not present

## 2023-01-18 NOTE — Patient Instructions (Signed)
It was a pleasure to see you today.  Thank you for giving [place] the opportunity to be involved in your care.  Below is a brief recap of your visit and next steps.  We will plan to see you again in 2-4 weeks.  Summary You have established care today We will check labs and request records from Dr. Ouida Sills. Follow up in 2-4 weeks for lab review.

## 2023-01-18 NOTE — Progress Notes (Signed)
New Patient Office Visit  Subjective    Patient ID: Jeremy Lopez, male    DOB: Sep 17, 1951  Age: 72 y.o. MRN: [id number]  CC:  Chief Complaint  Patient presents with   Establish Care   HPI Jeremy Lopez presents to establish care.  He is a 72 year old male who endorses a past medical history significant for CHF, CAD, chronic bronchitis, CKD, PVCs, history of lung cancer s/p chemotherapy/XRT (2008), HTN, and HLD.  He has most recently been followed by Dr. Ouida Sills.  Jeremy Lopez reports feeling well today.  His acute concerns are wanting to review his current medications as well as to discuss what he believes are symptoms of neuropathy in his lower extremities.  He endorses former tobacco use, quitting in January 2000, as well as current alcohol consumption (2 cans of beer per day).  Family medical history is significant for CAD, HTN, and unspecified cancer in his father.  Acute concerns, chronic medical conditions, and outstanding preventative care items discussed today are individually addressed A/P below.  Outpatient Encounter Medications as of 01/18/2023  Medication Sig   albuterol (VENTOLIN HFA) 108 (90 Base) MCG/ACT inhaler Inhale 2 puffs into the lungs every 6 (six) hours as needed for wheezing or shortness of breath.   Cholecalciferol (VITAMIN D) 50 MCG (2000 UT) tablet Take 2,000 Units by mouth daily.   clopidogrel (PLAVIX) 75 MG tablet Take 1 tablet (75 mg total) by mouth daily with breakfast.   diltiazem (DILACOR XR) 240 MG 24 hr capsule Take 1 capsule (240 mg total) by mouth daily.   empagliflozin (JARDIANCE) 10 MG TABS tablet Take 1 tablet (10 mg total) by mouth daily before breakfast.   fluticasone (FLONASE) 50 MCG/ACT nasal spray Place 2 sprays into both nostrils once a week.   Fluticasone-Umeclidin-Vilant (TRELEGY ELLIPTA) 100-62.5-25 MCG/ACT AEPB Inhale 1 puff into the lungs daily.   metoprolol succinate (TOPROL-XL) 100 MG 24 hr tablet TAKE ONE TABLET (100MG  TOTAL) BY MOUTH  DAILY. TAKE WITH OR IMMEDIATELY FOLLOWING A MEAL.   nitroGLYCERIN (NITROSTAT) 0.4 MG SL tablet Place 1 tablet (0.4 mg total) under the tongue every 5 (five) minutes as needed for chest pain.   rosuvastatin (CRESTOR) 40 MG tablet TAKE ONE (1) TABLET BY MOUTH EVERY DAY   sacubitril-valsartan (ENTRESTO) 49-51 MG Take 1 tablet by mouth 2 (two) times daily.   Spacer/Aero-Holding Rudean Curt Use with inhaler   [DISCONTINUED] levocetirizine (XYZAL) 5 MG tablet Take 1 tablet (5 mg total) by mouth every evening.   [DISCONTINUED] doxycycline (VIBRAMYCIN) 100 MG capsule Take 100 mg by mouth 2 (two) times daily.   Facility-Administered Encounter Medications as of 01/18/2023  Medication   sodium chloride flush (NS) 0.9 % injection 3 mL    Past Medical History:  Diagnosis Date   CAD (coronary artery disease), native coronary artery    DES proximal LAD 07/2009; DES to RCA 04/2020   Essential hypertension    Habitual alcohol use    Hyperlipidemia    Small cell lung cancer    Right upper lobe 2008; no evidence of disease since treatment ended in 11/2007    Past Surgical History:  Procedure Laterality Date   CORONARY ATHERECTOMY N/A 04/26/2020   Procedure: CORONARY ATHERECTOMY;  Surgeon: Corky Crafts, MD;  Location: [facility] INVASIVE CV LAB;  Service: Cardiovascular;  Laterality: N/A;   CORONARY STENT INTERVENTION N/A 04/26/2020   Procedure: CORONARY STENT INTERVENTION;  Surgeon: Corky Crafts, MD;  Location: [facility] INVASIVE CV LAB;  Service: Cardiovascular;  Laterality: N/A;   CORONARY ULTRASOUND/IVUS N/A 04/26/2020   Procedure: Intravascular Ultrasound/IVUS;  Surgeon: Corky Crafts, MD;  Location: [facility] INVASIVE CV LAB;  Service: Cardiovascular;  Laterality: N/A;   Insertion of left subclavian Port-A-Cath  2008   LEFT HEART CATH AND CORONARY ANGIOGRAPHY N/A 07/03/2022   Procedure: LEFT HEART CATH AND CORONARY ANGIOGRAPHY;  Surgeon: Swazil[place], Peter M, MD;  Location: [facility] INVASIVE CV LAB;  Service:  Cardiovascular;  Laterality: N/A;   RIGHT/LEFT HEART CATH AND CORONARY ANGIOGRAPHY N/A 04/26/2020   Procedure: RIGHT/LEFT HEART CATH AND CORONARY ANGIOGRAPHY;  Surgeon: Corky Crafts, MD;  Location: [facility] INVASIVE CV LAB;  Service: Cardiovascular;  Laterality: N/A;   TEMPORARY PACEMAKER N/A 04/26/2020   Procedure: TEMPORARY PACEMAKER;  Surgeon: Corky Crafts, MD;  Location: [facility] INVASIVE CV LAB;  Service: Cardiovascular;  Laterality: N/A;   Video bronchoscopy and video mediastinoscopy  2008    Family History  Problem Relation Age of Onset   Cancer Father    CAD Brother    Hypertension Brother     Social History   Socioeconomic History   Marital status: Married    Spouse name: Not on file   Number of children: Not on file   Years of education: Not on file   Highest education level: Not on file  Occupational History   Occupation: Full time    Comment: Industrial/product designer for [employer]  Tobacco Use   Smoking status: Former    Types: Cigarettes    Start date: 10/25/1966    Quit date: 10/25/1998    Years since quitting: 24.2   Smokeless tobacco: Never  Vaping Use   Vaping Use: Never used  Substance and Sexual Activity   Alcohol use: Yes    Alcohol/week: 14.0 standard drinks of alcohol    Types: 14 Cans of beer per week   Drug use: No   Sexual activity: Not on file  Other Topics Concern   Not on file  Social History Narrative   Not on file   Social Determinants of Health   Financial Resource Strain: Not on file  Food Insecurity: Not on file  Transportation Needs: Not on file  Physical Activity: Not on file  Stress: Not on file  Social Connections: Not on file  Intimate Partner Violence: Not on file   Review of Systems  Constitutional:  Negative for chills and fever.  HENT:  Negative for sore throat.   Respiratory:  Negative for cough and shortness of breath.   Cardiovascular:  Negative for chest pain, palpitations and leg swelling.  Gastrointestinal:  Negative for  abdominal pain, blood in stool, constipation, diarrhea, nausea and vomiting.  Genitourinary:  Negative for dysuria and hematuria.  Musculoskeletal:  Negative for myalgias.  Skin:  Negative for itching and rash.  Neurological:  Positive for tingling (Lower extremities). Negative for dizziness and headaches.  Psychiatric/Behavioral:  Negative for depression and suicidal ideas.    Objective    BP 138/80   Pulse 64   Ht 5\' 10"  (1.778 m)   Wt 220 lb 12.8 oz (100.2 kg)   SpO2 92%   BMI 31.68 kg/m   Physical Exam Vitals reviewed.  Constitutional:      General: He is not in acute distress.    Appearance: Normal appearance. He is obese. He is not ill-appearing.  HENT:     Head: Normocephalic and atraumatic.     Right Ear: External ear normal.     Left Ear: External ear normal.  Nose: Nose normal. No congestion or rhinorrhea.     Mouth/Throat:     Mouth: Mucous membranes are moist.     Pharynx: Oropharynx is clear.  Eyes:     General: No scleral icterus.    Extraocular Movements: Extraocular movements intact.     Conjunctiva/sclera: Conjunctivae normal.     Pupils: Pupils are equal, round, and reactive to light.  Cardiovascular:     Rate and Rhythm: Normal rate and regular rhythm.     Pulses: Normal pulses.     Heart sounds: Normal heart sounds. No murmur heard. Pulmonary:     Effort: Pulmonary effort is normal.     Breath sounds: Normal breath sounds. No wheezing, rhonchi or rales.  Abdominal:     General: Abdomen is flat. Bowel sounds are normal. There is no distension.     Palpations: Abdomen is soft.     Tenderness: There is no abdominal tenderness.  Musculoskeletal:        General: No swelling or deformity. Normal range of motion.     Cervical back: Normal range of motion.  Skin:    General: Skin is warm and dry.     Capillary Refill: Capillary refill takes less than 2 seconds.  Neurological:     General: No focal deficit present.     Mental Status: He is alert and  oriented to person, place, and time.     Motor: No weakness.  Psychiatric:        Mood and Affect: Mood normal.        Behavior: Behavior normal.        Thought Content: Thought content normal.    Assessment & Plan:   Problem List Items Addressed This Visit       Essential hypertension    BP 138/80 today.  Continue current antihypertensive regimen      CAD (coronary artery disease), native coronary artery    CAD with prior history of PCI.  Denies chest pain currently.  He is prescribed Plavix, rosuvastatin, metoprolol, and diltiazem.  Closely followed by cardiology. -No medication changes today      CHF (congestive heart failure)    Previously documented history of HF. Euvolemic on exam today.  EF preserved on most recent echocardiogram from September 2023.  Followed by cardiology (Dr. Tenny Craw).  He is currently prescribed Entresto, Jardiance, and metoprolol. -No medication changes today      Lung cancer    History of small cell lung cancer of the right upper lobe diagnosed 2008.  Treated with chemotherapy and radiation which was completed in 2009. -Followed by pulmonology.  Recently referred for lung cancer screening.      Simple chronic bronchitis    Followed by pulmonology (Dr. Tonia Brooms).  PFTs from 1 year ago showed largely normal spirometry.  He is currently prescribed Trelegy.  He is asymptomatic.  Pulmonary exam today is unremarkable. -No medication changes today      Mixed hyperlipidemia    Currently prescribed rosuvastatin 40 mg daily. -Repeat lipid panel ordered today      Numbness and tingling of both lower extremities    He endorses a recent history of numbness, tingling, and a burning sensation in the lower extremities bilaterally.  He is concerned that he may have neuropathy.  We discussed the workup and treatment for peripheral neuropathy.  He would first like to have baseline labs completed and then discuss next steps if his symptoms persist. -Baseline labs  ordered today  Encounter for general adult medical examination with abnormal findings - Primary    Presenting today to establish care.  Available records and labs been reviewed. -Records from his previous PCP have been requested -Baseline labs ordered today -We will tentatively plan for follow-up in 2-4 weeks to review labs, make appropriate medication adjustments, and address outstanding preventative care items.      Return in about 2 weeks (around 02/01/2023) for lab review.   [name], MD

## 2023-01-19 LAB — CMP14+EGFR
ALT: 15 IU/L (ref 0–44)
AST: 15 IU/L (ref 0–40)
Albumin/Globulin Ratio: 1.8 (ref 1.2–2.2)
Albumin: 4.4 g/dL (ref 3.8–4.8)
Alkaline Phosphatase: 95 IU/L (ref 44–121)
BUN/Creatinine Ratio: 16 (ref 10–24)
BUN: 19 mg/dL (ref 8–27)
Bilirubin Total: 0.4 mg/dL (ref 0.0–1.2)
CO2: 22 mmol/L (ref 20–29)
Calcium: 9.4 mg/dL (ref 8.6–10.2)
Chloride: 103 mmol/L (ref 96–106)
Creatinine, Ser: 1.18 mg/dL (ref 0.76–1.27)
Globulin, Total: 2.5 g/dL (ref 1.5–4.5)
Glucose: 91 mg/dL (ref 70–99)
Potassium: 5.2 mmol/L (ref 3.5–5.2)
Sodium: 140 mmol/L (ref 134–144)
Total Protein: 6.9 g/dL (ref 6.0–8.5)
eGFR: 66 mL/min/{1.73_m2} (ref >59)

## 2023-01-19 LAB — CBC WITH DIFFERENTIAL/PLATELET
Basophils Absolute: 0.1 10*3/uL (ref 0.0–0.2)
Basos: 1 %
EOS (ABSOLUTE): 0.1 10*3/uL (ref 0.0–0.4)
Eos: 2 %
Hematocrit: 46.5 % (ref 37.5–51.0)
Hemoglobin: 15.5 g/dL (ref 13.0–17.7)
Immature Grans (Abs): 0 10*3/uL (ref 0.0–0.1)
Immature Granulocytes: 0 %
Lymphocytes Absolute: 1.3 10*3/uL (ref 0.7–3.1)
Lymphs: 17 %
MCH: 30.7 pg (ref 26.6–33.0)
MCHC: 33.3 g/dL (ref 31.5–35.7)
MCV: 92 fL (ref 79–97)
Monocytes Absolute: 0.7 10*3/uL (ref 0.1–0.9)
Monocytes: 9 %
Neutrophils Absolute: 5.4 10*3/uL (ref 1.4–7.0)
Neutrophils: 71 %
Platelets: 350 10*3/uL (ref 150–450)
RBC: 5.05 x10E6/uL (ref 4.14–5.80)
RDW: 13.2 % (ref 11.6–15.4)
WBC: 7.7 10*3/uL (ref 3.4–10.8)

## 2023-01-19 LAB — LIPID PANEL
Chol/HDL Ratio: 3 ratio (ref 0.0–5.0)
Cholesterol, Total: 162 mg/dL (ref 100–199)
HDL: 54 mg/dL (ref >39)
LDL Chol Calc (NIH): 76 mg/dL (ref 0–99)
Triglycerides: 189 mg/dL — ABNORMAL HIGH (ref 0–149)
VLDL Cholesterol Cal: 32 mg/dL (ref 5–40)

## 2023-01-19 LAB — IRON,TIBC AND FERRITIN PANEL
Ferritin: 236 ng/mL (ref 30–400)
Iron Saturation: 35 % (ref 15–55)
Iron: 125 ug/dL (ref 38–169)
Total Iron Binding Capacity: 362 ug/dL (ref 250–450)
UIBC: 237 ug/dL (ref 111–343)

## 2023-01-19 LAB — TSH+FREE T4
Free T4: 1.17 ng/dL (ref 0.82–1.77)
TSH: 2.59 u[IU]/mL (ref 0.450–4.500)

## 2023-01-19 LAB — B12 AND FOLATE PANEL
Folate: 13.2 ng/mL (ref >3.0)
Vitamin B-12: 261 pg/mL (ref 232–1245)

## 2023-01-19 LAB — HEMOGLOBIN A1C
Est. average glucose Bld gHb Est-mCnc: 120 mg/dL
Hgb A1c MFr Bld: 5.8 % — ABNORMAL HIGH (ref 4.8–5.6)

## 2023-01-19 LAB — VITAMIN D 25 HYDROXY (VIT D DEFICIENCY, FRACTURES): Vit D, 25-Hydroxy: 40.7 ng/mL (ref 30.0–100.0)

## 2023-01-23 ENCOUNTER — Encounter: Payer: Self-pay | Admitting: Internal Medicine

## 2023-01-23 DIAGNOSIS — Z0001 Encounter for general adult medical examination with abnormal findings: Secondary | ICD-10-CM | POA: Insufficient documentation

## 2023-01-23 DIAGNOSIS — R200 Anesthesia of skin: Secondary | ICD-10-CM | POA: Insufficient documentation

## 2023-01-23 DIAGNOSIS — I509 Heart failure, unspecified: Secondary | ICD-10-CM | POA: Insufficient documentation

## 2023-01-23 NOTE — Assessment & Plan Note (Addendum)
Previously documented history of HF. Euvolemic on exam today.  EF preserved on most recent echocardiogram from September 2023.  Followed by cardiology (Dr. Tenny Craw).  He is currently prescribed Entresto, Jardiance, and metoprolol. -No medication changes today

## 2023-01-23 NOTE — Assessment & Plan Note (Signed)
History of small cell lung cancer of the right upper lobe diagnosed 2008.  Treated with chemotherapy and radiation which was completed in 2009. -Followed by pulmonology.  Recently referred for lung cancer screening.

## 2023-01-23 NOTE — Assessment & Plan Note (Signed)
Followed by pulmonology (Dr. Tonia Brooms).  PFTs from 1 year ago showed largely normal spirometry.  He is currently prescribed Trelegy.  He is asymptomatic.  Pulmonary exam today is unremarkable. -No medication changes today

## 2023-01-23 NOTE — Assessment & Plan Note (Signed)
Presenting today to establish care.  Available records and labs been reviewed. -Records from his previous PCP have been requested -Baseline labs ordered today -We will tentatively plan for follow-up in 2-4 weeks to review labs, make appropriate medication adjustments, and address outstanding preventative care items.

## 2023-01-23 NOTE — Assessment & Plan Note (Signed)
CAD with prior history of PCI.  Denies chest pain currently.  He is prescribed Plavix, rosuvastatin, metoprolol, and diltiazem.  Closely followed by cardiology. -No medication changes today

## 2023-01-23 NOTE — Assessment & Plan Note (Signed)
BP 138/80 today.  Continue current antihypertensive regimen

## 2023-01-23 NOTE — Assessment & Plan Note (Signed)
He endorses a recent history of numbness, tingling, and a burning sensation in the lower extremities bilaterally.  He is concerned that he may have neuropathy.  We discussed the workup and treatment for peripheral neuropathy.  He would first like to have baseline labs completed and then discuss next steps if his symptoms persist. -Baseline labs ordered today

## 2023-01-23 NOTE — Assessment & Plan Note (Signed)
Currently prescribed rosuvastatin 40 mg daily.  Repeat lipid panel ordered today. 

## 2023-01-24 ENCOUNTER — Encounter: Payer: Self-pay | Admitting: Internal Medicine

## 2023-02-01 ENCOUNTER — Encounter: Payer: Self-pay | Admitting: Internal Medicine

## 2023-02-01 ENCOUNTER — Ambulatory Visit (INDEPENDENT_AMBULATORY_CARE_PROVIDER_SITE_OTHER): Payer: HMO | Admitting: Internal Medicine

## 2023-02-01 VITALS — BP 118/63 | HR 96 | Temp 97.6°F | Ht 70.0 in | Wt 221.0 lb

## 2023-02-01 DIAGNOSIS — Z1211 Encounter for screening for malignant neoplasm of colon: Secondary | ICD-10-CM | POA: Diagnosis not present

## 2023-02-01 DIAGNOSIS — I509 Heart failure, unspecified: Secondary | ICD-10-CM | POA: Diagnosis not present

## 2023-02-01 DIAGNOSIS — I2510 Atherosclerotic heart disease of native coronary artery without angina pectoris: Secondary | ICD-10-CM

## 2023-02-01 DIAGNOSIS — N182 Chronic kidney disease, stage 2 (mild): Secondary | ICD-10-CM | POA: Insufficient documentation

## 2023-02-01 DIAGNOSIS — R200 Anesthesia of skin: Secondary | ICD-10-CM | POA: Diagnosis not present

## 2023-02-01 DIAGNOSIS — R7303 Prediabetes: Secondary | ICD-10-CM | POA: Diagnosis not present

## 2023-02-01 DIAGNOSIS — R110 Nausea: Secondary | ICD-10-CM | POA: Diagnosis not present

## 2023-02-01 DIAGNOSIS — I10 Essential (primary) hypertension: Secondary | ICD-10-CM

## 2023-02-01 DIAGNOSIS — R202 Paresthesia of skin: Secondary | ICD-10-CM | POA: Diagnosis not present

## 2023-02-01 MED ORDER — DILTIAZEM HCL ER 240 MG PO CP24: 240.0000 mg | ORAL_CAPSULE | Freq: Every day | ORAL | 3 refills | Status: DC

## 2023-02-01 MED ORDER — PROCHLORPERAZINE MALEATE 5 MG PO TABS
5.0000 mg | ORAL_TABLET | Freq: Four times a day (QID) | ORAL | 0 refills | Status: DC | PRN
Start: 2023-02-01 — End: 2023-07-21

## 2023-02-01 MED ORDER — GABAPENTIN 300 MG PO CAPS: 300.0000 mg | ORAL_CAPSULE | Freq: Every day | ORAL | 3 refills | Status: DC

## 2023-02-01 NOTE — Assessment & Plan Note (Signed)
Cologuard ordered today °

## 2023-02-01 NOTE — Assessment & Plan Note (Signed)
A1c 5.8 on labs from earlier this month.  We reviewed appropriate dietary and exercise habits and improving his blood sugar.

## 2023-02-01 NOTE — Progress Notes (Signed)
Established Patient Office Visit  Subjective   Patient ID: Jeremy Lopez, male    DOB: 16-Dec-1950  Age: 72 y.o. MRN: [id number]  Chief Complaint  Patient presents with   Follow-up    Follow-up disposition: Return in about 2 weeks (around 02/01/2023) for lab review.    [name] returns to care today for follow-up.  He was last evaluated by me on 4/12 as a patient presenting to establish care.  Baseline labs were ordered and records from his previous PCP were requested.  2-week follow-up was arranged for lab and medication review.  There have been no acute interval events.  [name] reports feeling well today.  His acute concern remains lower extremity paresthesias.  He would like to have this further evaluated.  He has additionally requested a new referral to cardiac rehab today as he has previously benefited from this and appreciates the structured exercise routine.  Past Medical History:  Diagnosis Date   CAD (coronary artery disease), native coronary artery    DES proximal LAD 07/2009; DES to RCA 04/2020   Essential hypertension    Habitual alcohol use    Hyperlipidemia    Small cell lung cancer (HCC)    Right upper lobe 2008; no evidence of disease since treatment ended in 11/2007   Past Surgical History:  Procedure Laterality Date   CORONARY ATHERECTOMY N/A 04/26/2020   Procedure: CORONARY ATHERECTOMY;  Surgeon: Jeremy Crafts, MD;  Location: [facility] INVASIVE CV LAB;  Service: Cardiovascular;  Laterality: N/A;   CORONARY STENT INTERVENTION N/A 04/26/2020   Procedure: CORONARY STENT INTERVENTION;  Surgeon: Jeremy Crafts, MD;  Location: [facility] INVASIVE CV LAB;  Service: Cardiovascular;  Laterality: N/A;   CORONARY ULTRASOUND/IVUS N/A 04/26/2020   Procedure: Intravascular Ultrasound/IVUS;  Surgeon: Jeremy Crafts, MD;  Location: [facility] INVASIVE CV LAB;  Service: Cardiovascular;  Laterality: N/A;   Insertion of left subclavian Port-A-Cath  2008   LEFT HEART CATH AND CORONARY  ANGIOGRAPHY N/A 07/03/2022   Procedure: LEFT HEART CATH AND CORONARY ANGIOGRAPHY;  Surgeon: Swazil[place], Peter M, MD;  Location: [facility] INVASIVE CV LAB;  Service: Cardiovascular;  Laterality: N/A;   RIGHT/LEFT HEART CATH AND CORONARY ANGIOGRAPHY N/A 04/26/2020   Procedure: RIGHT/LEFT HEART CATH AND CORONARY ANGIOGRAPHY;  Surgeon: Jeremy Crafts, MD;  Location: [facility] INVASIVE CV LAB;  Service: Cardiovascular;  Laterality: N/A;   TEMPORARY PACEMAKER N/A 04/26/2020   Procedure: TEMPORARY PACEMAKER;  Surgeon: Jeremy Crafts, MD;  Location: [facility] INVASIVE CV LAB;  Service: Cardiovascular;  Laterality: N/A;   Video bronchoscopy and video mediastinoscopy  2008   Social History   Tobacco Use   Smoking status: Former    Types: Cigarettes    Start date: 10/25/1966    Quit date: 10/25/1998    Years since quitting: 24.2   Smokeless tobacco: Never  Vaping Use   Vaping Use: Never used  Substance Use Topics   Alcohol use: Yes    Alcohol/week: 14.0 standard drinks of alcohol    Types: 14 Cans of beer per week   Drug use: No   Family History  Problem Relation Age of Onset   Cancer Father    CAD Brother    Hypertension Brother    Allergies  Allergen Reactions   Cephalexin Anaphylaxis    throat swelling Patient said he can take amoxicillin   Amlodipine Swelling    Leg swelling   Review of Systems  Neurological:  Positive for tingling (Both lower legs).  All other systems  reviewed and are negative.    Objective:     BP 118/63   Pulse 96   Temp 97.6 F (36.4 C) (Oral)   Ht 5\' 10"  (1.778 m)   Wt 221 lb (100.2 kg)   SpO2 92%   BMI 31.71 kg/m  BP Readings from Last 3 Encounters:  02/01/23 118/63  01/18/23 138/80  11/19/22 (!) 160/94   Physical Exam Vitals reviewed.  Constitutional:      General: He is not in acute distress.    Appearance: Normal appearance. He is obese. He is not ill-appearing.  HENT:     Head: Normocephalic and atraumatic.     Right Ear: External ear normal.      Left Ear: External ear normal.     Nose: Nose normal. No congestion or rhinorrhea.     Mouth/Throat:     Mouth: Mucous membranes are moist.     Pharynx: Oropharynx is clear.  Eyes:     General: No scleral icterus.    Extraocular Movements: Extraocular movements intact.     Conjunctiva/sclera: Conjunctivae normal.     Pupils: Pupils are equal, round, and reactive to light.  Cardiovascular:     Rate and Rhythm: Normal rate and regular rhythm.     Pulses: Normal pulses.     Heart sounds: Normal heart sounds. No murmur heard. Pulmonary:     Effort: Pulmonary effort is normal.     Breath sounds: Normal breath sounds. No wheezing, rhonchi or rales.  Abdominal:     General: Abdomen is flat. Bowel sounds are normal. There is no distension.     Palpations: Abdomen is soft.     Tenderness: There is no abdominal tenderness.  Musculoskeletal:        General: No swelling or deformity. Normal range of motion.     Cervical back: Normal range of motion.  Skin:    General: Skin is warm and dry.     Capillary Refill: Capillary refill takes less than 2 seconds.  Neurological:     General: No focal deficit present.     Mental Status: He is alert and oriented to person, place, and time.     Motor: No weakness.  Psychiatric:        Mood and Affect: Mood normal.        Behavior: Behavior normal.        Thought Content: Thought content normal.   Last CBC Lab Results  Component Value Date   WBC 7.7 01/18/2023   HGB 15.5 01/18/2023   HCT 46.5 01/18/2023   MCV 92 01/18/2023   MCH 30.7 01/18/2023   RDW 13.2 01/18/2023   PLT 350 01/18/2023   Last metabolic panel Lab Results  Component Value Date   GLUCOSE 91 01/18/2023   NA 140 01/18/2023   K 5.2 01/18/2023   CL 103 01/18/2023   CO2 22 01/18/2023   BUN 19 01/18/2023   CREATININE 1.18 01/18/2023   EGFR 66 01/18/2023   CALCIUM 9.4 01/18/2023   PROT 6.9 01/18/2023   ALBUMIN 4.4 01/18/2023   LABGLOB 2.5 01/18/2023   AGRATIO 1.8  01/18/2023   BILITOT 0.4 01/18/2023   ALKPHOS 95 01/18/2023   AST 15 01/18/2023   ALT 15 01/18/2023   ANIONGAP 9 06/28/2022   Last lipids Lab Results  Component Value Date   CHOL 162 01/18/2023   HDL 54 01/18/2023   LDLCALC 76 01/18/2023   TRIG 189 (H) 01/18/2023   CHOLHDL 3.0 01/18/2023   Last  hemoglobin A1c Lab Results  Component Value Date   HGBA1C 5.8 (H) 01/18/2023   Last thyroid functions Lab Results  Component Value Date   TSH 2.590 01/18/2023   Last vitamin D Lab Results  Component Value Date   VD25OH 40.7 01/18/2023   Last vitamin B12 and Folate Lab Results  Component Value Date   VITAMINB12 261 01/18/2023   FOLATE 13.2 01/18/2023   The 10-year ASCVD risk score (Arnett DK, et al., 2019) is: 17.2%    Assessment & Plan:   Problem List Items Addressed This Visit       CHF (congestive heart failure) (HCC)    Followed by cardiology (Dr. Tenny Craw).  Remains euvolemic on exam.  He has requested a new referral to cardiac rehab today as he would like to resume a structured exercise program. -Cardiac rehab referral placed at patient's request      CKD (chronic kidney disease) stage 2, GFR 60-89 ml/min    Renal function remained stable.  He is currently prescribed Entresto and Jardiance. -No medication changes today.      Numbness and tingling of both lower extremities    Symptoms are largely unchanged from his last appointment.  He has previously undergone ABIs, which were normal and June 2021.  States that his feet will feel cold in the evening, however his wife touches them and they are warm.  Legs feel tired at the end of the day and symptoms are most bothersome at night.  Of note, he has previously completed chemotherapy in the setting of lung cancer. -Trial gabapentin 300 mg nightly for symptom relief -Neurology referral placed for NCV      Prediabetes    A1c 5.8 on labs from earlier this month.  We reviewed appropriate dietary and exercise habits and  improving his blood sugar.      Nausea    He endorses occasional nausea and has been prescribed Compazine for as needed symptom relief in the past.  He has requested a refill today. -Compazine has been refilled      Colon cancer screening    Cologuard ordered today      Return in about 6 months (around 08/03/2023).   [name], MD

## 2023-02-01 NOTE — Assessment & Plan Note (Signed)
Symptoms are largely unchanged from his last appointment.  He has previously undergone ABIs, which were normal and June 2021.  States that his feet will feel cold in the evening, however his wife touches them and they are warm.  Legs feel tired at the end of the day and symptoms are most bothersome at night.  Of note, he has previously completed chemotherapy in the setting of lung cancer. -Trial gabapentin 300 mg nightly for symptom relief -Neurology referral placed for NCV

## 2023-02-01 NOTE — Assessment & Plan Note (Signed)
Renal function remained stable.  He is currently prescribed Entresto and Jardiance. -No medication changes today.

## 2023-02-01 NOTE — Assessment & Plan Note (Signed)
He endorses occasional nausea and has been prescribed Compazine for as needed symptom relief in the past.  He has requested a refill today. -Compazine has been refilled

## 2023-02-01 NOTE — Patient Instructions (Signed)
It was a pleasure to see you today.  Thank you for giving [place] the opportunity to be involved in your care.  Below is a brief recap of your visit and next steps.  We will plan to see you again in 6 months.  Summary Start gabapentin 300 mg nightly for neuropathy Neurology referral placed for nerve conduction testing Referral placed for cardiac rehab Follow up in 6 months

## 2023-02-01 NOTE — Assessment & Plan Note (Signed)
Followed by cardiology (Dr. Tenny Craw).  Remains euvolemic on exam.  He has requested a new referral to cardiac rehab today as he would like to resume a structured exercise program. -Cardiac rehab referral placed at patient's request

## 2023-02-04 ENCOUNTER — Other Ambulatory Visit (HOSPITAL_COMMUNITY): Payer: Self-pay

## 2023-02-04 DIAGNOSIS — I509 Heart failure, unspecified: Secondary | ICD-10-CM

## 2023-02-27 DIAGNOSIS — Z1211 Encounter for screening for malignant neoplasm of colon: Secondary | ICD-10-CM | POA: Diagnosis not present

## 2023-03-07 ENCOUNTER — Encounter (HOSPITAL_COMMUNITY): Payer: Self-pay | Admitting: Surgery

## 2023-03-07 LAB — COLOGUARD: COLOGUARD: NEGATIVE

## 2023-03-07 NOTE — Progress Notes (Signed)
.  Cardiac Rehab Medication Review by a Nurse   Does the patient  feel that his/her medications are working for him/her?  Yes   Has the patient been experiencing any side effects to the medications prescribed?  No   Does the patient measure his/her own blood pressure or blood glucose at home?  Pt checks his blood pressure daily.   Does the patient have any problems obtaining medications due to transportation or finances?   No   Understanding of regimen: good Understanding of indications: good Potential of compliance: good      Nurse comments: Reviewed pt's medications over the phone prior to orientation.

## 2023-03-08 ENCOUNTER — Encounter (HOSPITAL_COMMUNITY): Payer: Self-pay

## 2023-03-08 ENCOUNTER — Encounter (HOSPITAL_COMMUNITY)
Admission: RE | Admit: 2023-03-08 | Discharge: 2023-03-08 | Disposition: A | Discharge status: Home / Self Care | Payer: HMO | Source: Ambulatory Visit | Attending: Internal Medicine | Admitting: Internal Medicine

## 2023-03-08 VITALS — BP 120/68 | Ht 70.0 in | Wt 219.4 lb

## 2023-03-08 DIAGNOSIS — I509 Heart failure, unspecified: Secondary | ICD-10-CM | POA: Diagnosis not present

## 2023-03-08 NOTE — Progress Notes (Signed)
Pulmonary Individual Treatment Plan  Patient Details  Name: Jeremy Lopez MRN: [id number] Date of Birth: Mar 06, 1951 Referring Provider:   Flowsheet Row PULMONARY REHAB OTHER RESP ORIENTATION from 03/08/2023 in [facility] CARDIAC REHABILITATION  Referring Provider Dr. Dietrich Pates       Initial Encounter Date:  Flowsheet Row PULMONARY REHAB OTHER RESP ORIENTATION from 03/08/2023 in [facility] CARDIAC REHABILITATION  Date 03/08/23       Visit Diagnosis: Chronic congestive heart failure, unspecified heart failure type (HCC)  Patient's Home Medications on Admission:   Current Outpatient Medications:    albuterol (VENTOLIN HFA) 108 (90 Base) MCG/ACT inhaler, Inhale 2 puffs into the lungs every 6 (six) hours as needed for wheezing or shortness of breath., Disp: , Rfl:    Cholecalciferol (VITAMIN D) 50 MCG (2000 UT) tablet, Take 2,000 Units by mouth daily., Disp: , Rfl:    clopidogrel (PLAVIX) 75 MG tablet, Take 1 tablet (75 mg total) by mouth daily with breakfast., Disp: 90 tablet, Rfl: 3   diltiazem (DILACOR XR) 240 MG 24 hr capsule, Take 1 capsule (240 mg total) by mouth daily., Disp: 90 capsule, Rfl: 3   empagliflozin (JARDIANCE) 10 MG TABS tablet, Take 1 tablet (10 mg total) by mouth daily before breakfast., Disp: 30 tablet, Rfl: 11   fluticasone (FLONASE) 50 MCG/ACT nasal spray, Place 2 sprays into both nostrils once a week. (Patient not taking: Reported on 03/07/2023), Disp: , Rfl:    Fluticasone-Umeclidin-Vilant (TRELEGY ELLIPTA) 100-62.5-25 MCG/ACT AEPB, Inhale 1 puff into the lungs daily., Disp: 180 each, Rfl: 3   gabapentin (NEURONTIN) 300 MG capsule, Take 1 capsule (300 mg total) by mouth at bedtime., Disp: 90 capsule, Rfl: 3   metoprolol succinate (TOPROL-XL) 100 MG 24 hr tablet, TAKE ONE TABLET (100MG  TOTAL) BY MOUTH DAILY. TAKE WITH OR IMMEDIATELY FOLLOWING A MEAL., Disp: 90 tablet, Rfl: 2   nitroGLYCERIN (NITROSTAT) 0.4 MG SL tablet, Place 1 tablet (0.4 mg total) under the tongue  every 5 (five) minutes as needed for chest pain., Disp: 25 tablet, Rfl: 2   prochlorperazine (COMPAZINE) 5 MG tablet, Take 1 tablet (5 mg total) by mouth every 6 (six) hours as needed for nausea or vomiting., Disp: 30 tablet, Rfl: 0   rosuvastatin (CRESTOR) 40 MG tablet, TAKE ONE (1) TABLET BY MOUTH EVERY DAY, Disp: 90 tablet, Rfl: 3   sacubitril-valsartan (ENTRESTO) 49-51 MG, Take 1 tablet by mouth 2 (two) times daily., Disp: 60 tablet, Rfl: 11   Spacer/Aero-Holding Chambers DEVI, Use with inhaler, Disp: 1 each, Rfl: 2  Current Facility-Administered Medications:    sodium chloride flush (NS) 0.9 % injection 3 mL, 3 mL, Intravenous, Q12H, Dunn, Tacey Ruiz, PA-C  Past Medical History: Past Medical History:  Diagnosis Date   CAD (coronary artery disease), native coronary artery    DES proximal LAD 07/2009; DES to RCA 04/2020   Essential hypertension    Habitual alcohol use    Hyperlipidemia    Small cell lung cancer (HCC)    Right upper lobe 2008; no evidence of disease since treatment ended in 11/2007    Tobacco Use: Social History   Tobacco Use  Smoking Status Former   Types: Cigarettes   Start date: 10/25/1966   Quit date: 10/25/1998   Years since quitting: 24.3  Smokeless Tobacco Never    Labs: Review Flowsheet  More data exists      Latest Ref Rng & Units 12/28/2019 04/26/2020 01/02/2021 06/13/2022 01/18/2023  Labs for ITP Cardiac and Pulmonary Rehab  Cholestrol 100 - 199 mg/dL 409  - 811  914  782   LDL (calc) 0 - 99 mg/dL 61  - 49  40  76   HDL-C >39 mg/dL 42  - 49  51  54   Trlycerides 0 - 149 mg/dL 956  - 213  086  578   Hemoglobin A1c 4.8 - 5.6 % - - - - 5.8   PH, Arterial 7.350 - 7.450 - 7.386  - - -  PCO2 arterial 32.0 - 48.0 mmHg - 45.1  - - -  Bicarbonate 20.0 - 28.0 mmol/L - 27.0  25.1  - - -  TCO2 22 - 32 mmol/L - 28  26  - - -  O2 Saturation % - 72.0  94.0  - - -    Capillary Blood Glucose: No results found for: "GLUCAP"   Pulmonary Assessment Scores:   Pulmonary Assessment Scores     Row Name 03/08/23 1345         ADL UCSD   ADL Phase Entry     SOB Score total 15     Rest 0     Walk 1     Stairs 1     Bath 0     Dress 0     Shop 2       CAT Score   CAT Score 14       mMRC Score   mMRC Score 2             UCSD: Self-administered rating of dyspnea associated with activities of daily living (ADLs) 6-point scale (0 = "not at all" to 5 = "maximal or unable to do because of breathlessness")  Scoring Scores range from 0 to 120.  Minimally important difference is 5 units  CAT: CAT can identify the health impairment of COPD patients and is better correlated with disease progression.  CAT has a scoring range of zero to 40. The CAT score is classified into four groups of low (less than 10), medium (10 - 20), high (21-30) and very high (31-40) based on the impact level of disease on health status. A CAT score over 10 suggests significant symptoms.  A worsening CAT score could be explained by an exacerbation, poor medication adherence, poor inhaler technique, or progression of COPD or comorbid conditions.  CAT MCID is 2 points  mMRC: mMRC (Modified Medical Research Council) Dyspnea Scale is used to assess the degree of baseline functional disability in patients of respiratory disease due to dyspnea. No minimal important difference is established. A decrease in score of 1 point or greater is considered a positive change.   Pulmonary Function Assessment:   Exercise Target Goals: Exercise Program Goal: Individual exercise prescription set using results from initial 6 min walk test and THRR while considering  patient's activity barriers and safety.   Exercise Prescription Goal: Initial exercise prescription builds to 30-45 minutes a day of aerobic activity, 2-3 days per week.  Home exercise guidelines will be given to patient during program as part of exercise prescription that the participant will acknowledge.  Activity Barriers  & Risk Stratification:  Activity Barriers & Cardiac Risk Stratification - 03/08/23 1246       Activity Barriers & Cardiac Risk Stratification   Activity Barriers Shortness of Breath    Cardiac Risk Stratification High             6 Minute Walk:  6 Minute Walk     Row Name 03/08/23 1346  6 Minute Walk   Phase Initial     Distance 1200 feet     Walk Time 6 minutes     # of Rest Breaks 0     MPH 2.27     METS 2.63     RPE 11     Perceived Dyspnea  11     VO2 Peak 9.2     Symptoms No     Resting HR 75 bpm     Resting BP 120/68     Resting Oxygen Saturation  95 %     Exercise Oxygen Saturation  during 6 min walk 93 %     Max Ex. HR 103 bpm     Max Ex. BP 148/76     2 Minute Post BP 124/76       Interval HR   1 Minute HR 95     2 Minute HR 100     3 Minute HR 100     4 Minute HR 101     5 Minute HR 102     6 Minute HR 103     2 Minute Post HR 83     Interval Heart Rate? Yes       Interval Oxygen   Interval Oxygen? Yes     Baseline Oxygen Saturation % 95 %     1 Minute Oxygen Saturation % 94 %     1 Minute Liters of Oxygen 0 L     2 Minute Oxygen Saturation % 93 %     2 Minute Liters of Oxygen 0 L     3 Minute Oxygen Saturation % 94 %     3 Minute Liters of Oxygen 0 L     4 Minute Oxygen Saturation % 94 %     4 Minute Liters of Oxygen 0 L     5 Minute Oxygen Saturation % 94 %     5 Minute Liters of Oxygen 0 L     6 Minute Oxygen Saturation % 94 %     6 Minute Liters of Oxygen 0 L     2 Minute Post Oxygen Saturation % 96 %     2 Minute Post Liters of Oxygen 0 L              Oxygen Initial Assessment:  Oxygen Initial Assessment - 03/08/23 1345       Home Oxygen   Home Oxygen Device None    Sleep Oxygen Prescription None    Home Exercise Oxygen Prescription None    Home Resting Oxygen Prescription None      Initial 6 min Walk   Oxygen Used None      Program Oxygen Prescription   Program Oxygen Prescription None      Intervention    Short Term Goals To learn and understand importance of monitoring SPO2 with pulse oximeter and demonstrate accurate use of the pulse oximeter.;To learn and understand importance of maintaining oxygen saturations>88%;To learn and demonstrate proper pursed lip breathing techniques or other breathing techniques.     Long  Term Goals Verbalizes importance of monitoring SPO2 with pulse oximeter and return demonstration;Maintenance of O2 saturations>88%;Exhibits proper breathing techniques, such as pursed lip breathing or other method taught during program session;Compliance with respiratory medication             Oxygen Re-Evaluation:   Oxygen Discharge (Final Oxygen Re-Evaluation):   Initial Exercise Prescription:  Initial Exercise Prescription - 03/08/23 1400  Date of Initial Exercise RX and Referring Provider   Date 03/08/23    Referring Provider Dr. Dietrich Pates    Expected Discharge Date 07/11/23      Treadmill   MPH 1.6    Grade 0    Minutes 17      NuStep   Level 1    SPM 80    Minutes 22      Prescription Details   Frequency (times per week) 2    Duration Progress to 30 minutes of continuous aerobic without signs/symptoms of physical distress      Intensity   THRR 40-80% of Max Heartrate 60-119    Ratings of Perceived Exertion 11-13    Perceived Dyspnea 0-4      Resistance Training   Training Prescription Yes    Weight 5    Reps 10-15             Perform Capillary Blood Glucose checks as needed.  Exercise Prescription Changes:   Exercise Comments:   Exercise Goals and Review:   Exercise Goals     Row Name 03/08/23 1403             Exercise Goals   Increase Physical Activity Yes       Intervention Provide advice, education, support and counseling about physical activity/exercise needs.;Develop an individualized exercise prescription for aerobic and resistive training based on initial evaluation findings, risk stratification,  comorbidities and participant's personal goals.       Expected Outcomes Short Term: Attend rehab on a regular basis to increase amount of physical activity.;Long Term: Add in home exercise to make exercise part of routine and to increase amount of physical activity.;Long Term: Exercising regularly at least 3-5 days a week.       Increase Strength and Stamina Yes       Intervention Provide advice, education, support and counseling about physical activity/exercise needs.;Develop an individualized exercise prescription for aerobic and resistive training based on initial evaluation findings, risk stratification, comorbidities and participant's personal goals.       Expected Outcomes Short Term: Increase workloads from initial exercise prescription for resistance, speed, and METs.;Short Term: Perform resistance training exercises routinely during rehab and add in resistance training at home;Long Term: Improve cardiorespiratory fitness, muscular endurance and strength as measured by increased METs and functional capacity ( )       Able to understand and use rate of perceived exertion (RPE) scale Yes       Intervention Provide education and explanation on how to use RPE scale       Expected Outcomes Short Term: Able to use RPE daily in rehab to express subjective intensity level;Long Term:  Able to use RPE to guide intensity level when exercising independently       Able to understand and use Dyspnea scale Yes       Intervention Provide education and explanation on how to use Dyspnea scale       Expected Outcomes Short Term: Able to use Dyspnea scale daily in rehab to express subjective sense of shortness of breath during exertion;Long Term: Able to use Dyspnea scale to guide intensity level when exercising independently       Knowledge and understanding of Target Heart Rate Range (THRR) Yes       Intervention Provide education and explanation of THRR including how the numbers were predicted and where they  are located for reference       Expected Outcomes Short Term: Able to state/look  up THRR;Short Term: Able to use daily as guideline for intensity in rehab;Long Term: Able to use THRR to govern intensity when exercising independently       Understanding of Exercise Prescription Yes       Intervention Provide education, explanation, and written materials on patient's individual exercise prescription       Expected Outcomes Short Term: Able to explain program exercise prescription;Long Term: Able to explain home exercise prescription to exercise independently                Exercise Goals Re-Evaluation :   Discharge Exercise Prescription (Final Exercise Prescription Changes):   Nutrition:  Target Goals: Understanding of nutrition guidelines, daily intake of sodium 1500mg , cholesterol 200mg , calories 30% from fat and 7% or less from saturated fats, daily to have 5 or more servings of fruits and vegetables.  Biometrics:  Pre Biometrics - 03/08/23 1404       Pre Biometrics   Height 5\' 10"  (1.778 m)    Weight 99.5 kg    Waist Circumference 46.5 inches    Hip Circumference 44.5 inches    Waist to Hip Ratio 1.04 %    BMI (Calculated) 31.47    Triceps Skinfold 15 mm    % Body Fat 31.5 %    Grip Strength 39.8 kg    Flexibility 0 in    Single Leg Stand 8 seconds              Nutrition Therapy Plan and Nutrition Goals:  Nutrition Therapy & Goals - 03/08/23 1323       Nutrition Therapy   RD appointment deferred Yes      Personal Nutrition Goals   Comments Patient's diet assessment score was15. Handout explained and provided regarding healthier choices. We provide educational sessions on heart healthy nutrition and assistance with RD referral if patient is interested.      Intervention Plan   Intervention Nutrition handout(s) given to patient.    Expected Outcomes Short Term Goal: Understand basic principles of dietary content, such as calories, fat, sodium, cholesterol  and nutrients.             Nutrition Assessments:  Nutrition Assessments - 03/08/23 1323       MEDFICTS Scores   Pre Score 15            MEDIFICTS Score Key: ?70 Need to make dietary changes  40-70 Heart Healthy Diet ? 40 Therapeutic Level Cholesterol Diet   Picture Your Plate Scores: <09 Unhealthy dietary pattern with much room for improvement. 41-50 Dietary pattern unlikely to meet recommendations for good health and room for improvement. 51-60 More healthful dietary pattern, with some room for improvement.  >60 Healthy dietary pattern, although there may be some specific behaviors that could be improved.    Nutrition Goals Re-Evaluation:   Nutrition Goals Discharge (Final Nutrition Goals Re-Evaluation):   Psychosocial: Target Goals: Acknowledge presence or absence of significant depression and/or stress, maximize coping skills, provide positive support system. Participant is able to verbalize types and ability to use techniques and skills needed for reducing stress and depression.  Initial Review & Psychosocial Screening:  Initial Psych Review & Screening - 03/08/23 1327       Initial Review   Current issues with None Identified      Family Dynamics   Good Support System? Yes      Barriers   Psychosocial barriers to participate in program There are no identifiable barriers or psychosocial needs.;The patient should  benefit from training in stress management and relaxation.      Screening Interventions   Interventions Encouraged to exercise;To provide support and resources with identified psychosocial needs    Expected Outcomes Short Term goal: Identification and review with participant of any Quality of Life or Depression concerns found by scoring the questionnaire.;Short Term goal: Utilizing psychosocial counselor, staff and physician to assist with identification of specific Stressors or current issues interfering with healing process. Setting desired goal  for each stressor or current issue identified.             Quality of Life Scores:  Quality of Life - 03/08/23 1345       Quality of Life   Select Quality of Life      Quality of Life Scores   Health/Function Pre 21.38 %    Socioeconomic Pre 29 %    Psych/Spiritual Pre 30 %    Family Pre 27.6 %    GLOBAL Pre 25.41 %            Scores of 19 and below usually indicate a poorer quality of life in these areas.  A difference of  2-3 points is a clinically meaningful difference.  A difference of 2-3 points in the total score of the Quality of Life Index has been associated with significant improvement in overall quality of life, self-image, physical symptoms, and general health in studies assessing change in quality of life.   PHQ-9: Review Flowsheet       03/08/2023 01/18/2023 10/21/2020 07/14/2020  Depression screen PHQ 2/9  Decreased Interest 0 0 0 0  Down, Depressed, Hopeless 0 0 0 0  PHQ - 2 Score 0 0 0 0  Altered sleeping 1 3 0 0  Tired, decreased energy 1 3 0 1  Change in appetite 0 0 0 0  Feeling bad or failure about yourself  0 0 0 0  Trouble concentrating 0 0 0 0  Moving slowly or fidgety/restless 0 0 0 0  Suicidal thoughts 0 0 0 0  PHQ-9 Score 2 6 0 1  Difficult doing work/chores Not difficult at all - Not difficult at all Not difficult at all   Interpretation of Total Score  Total Score Depression Severity:  1-4 = Minimal depression, 5-9 = Mild depression, 10-14 = Moderate depression, 15-19 = Moderately severe depression, 20-27 = Severe depression   Psychosocial Evaluation and Intervention:  Psychosocial Evaluation - 03/08/23 1352       Psychosocial Evaluation & Interventions   Interventions Stress management education;Relaxation education;Encouraged to exercise with the program and follow exercise prescription    Comments Patient has no psychosocial barriers or issues identified at his orientation visit. His PHQ-9 score was 2 due to lack of energy and  trouble staying alseep at times. He denies any depression or anxiety. He has participated in cardiac rehab with [place] and is very familiar with our staff and the program. He is a retired Occupational hygienist. He has a great support system with his wife, 2 children and several grandchildren and he has a brother and several friends that also support him. He is very motivated to get back into an exercise routine and is ready to start the program.    Expected Outcomes Patient will have no psychosocial issues identified.    Continue Psychosocial Services  No Follow up required             Psychosocial Re-Evaluation:   Psychosocial Discharge (Final Psychosocial Re-Evaluation):    Education: Education  Goals: Education classes will be provided on a weekly basis, covering required topics. Participant will state understanding/return demonstration of topics presented.  Learning Barriers/Preferences:  Learning Barriers/Preferences - 03/08/23 1324       Learning Barriers/Preferences   Learning Barriers None    Learning Preferences Written Material             Education Topics: How Lungs Work and Diseases: - Discuss the anatomy of the lungs and diseases that can affect the lungs, such as COPD.   Exercise: -Discuss the importance of exercise, FITT principles of exercise, normal and abnormal responses to exercise, and how to exercise safely.   Environmental Irritants: -Discuss types of environmental irritants and how to limit exposure to environmental irritants.   Meds/Inhalers and oxygen: - Discuss respiratory medications, definition of an inhaler and oxygen, and the proper way to use an inhaler and oxygen.   Energy Saving Techniques: - Discuss methods to conserve energy and decrease shortness of breath when performing activities of daily living.    Bronchial Hygiene / Breathing Techniques: - Discuss breathing mechanics, pursed-lip breathing technique,  proper posture, effective ways to clear  airways, and other functional breathing techniques   Cleaning Equipment: - Provides group verbal and written instruction about the health risks of elevated stress, cause of high stress, and healthy ways to reduce stress.   Nutrition I: Fats: - Discuss the types of cholesterol, what cholesterol does to the body, and how cholesterol levels can be controlled. Flowsheet Row CARDIAC REHAB PHASE II EXERCISE from 10/05/2020 in [place] CARDIAC REHABILITATION  Date 08/31/20  Educator [facility]  Instruction Review Code 2- Demonstrated Understanding       Nutrition II: Labels: -Discuss the different components of food labels and how to read food labels. Flowsheet Row CARDIAC REHAB PHASE II EXERCISE from 10/05/2020 in [place] CARDIAC REHABILITATION  Date 09/07/20  Educator [facility]  Instruction Review Code 2- Demonstrated Understanding       Respiratory Infections: - Discuss the signs and symptoms of respiratory infections, ways to prevent respiratory infections, and the importance of seeking medical treatment when having a respiratory infection.   Stress I: Signs and Symptoms: - Discuss the causes of stress, how stress may lead to anxiety and depression, and ways to limit stress.   Stress II: Relaxation: -Discuss relaxation techniques to limit stress.   Oxygen for Home/Travel: - Discuss how to prepare for travel when on oxygen and proper ways to transport and store oxygen to ensure safety.   Knowledge Questionnaire Score:  Knowledge Questionnaire Score - 03/08/23 1324       Knowledge Questionnaire Score   Pre Score 14/18             Core Components/Risk Factors/Patient Goals at Admission:  Personal Goals and Risk Factors at Admission - 03/08/23 1325       Core Components/Risk Factors/Patient Goals on Admission    Weight Management Obesity    Improve shortness of breath with ADL's Yes    Intervention Provide education, individualized exercise plan and daily activity  instruction to help decrease symptoms of SOB with activities of daily living.    Expected Outcomes Short Term: Improve cardiorespiratory fitness to achieve a reduction of symptoms when performing ADLs;Long Term: Be able to perform more ADLs without symptoms or delay the onset of symptoms    Heart Failure Yes    Intervention Provide a combined exercise and nutrition program that is supplemented with education, support and counseling about heart failure. Directed toward relieving  symptoms such as shortness of breath, decreased exercise tolerance, and extremity edema.    Expected Outcomes Short term: Attendance in program 2-3 days a week with increased exercise capacity. Reported lower sodium intake. Reported increased fruit and vegetable intake. Reports medication compliance.;Long term: Adoption of self-care skills and reduction of barriers for early signs and symptoms recognition and intervention leading to self-care maintenance.    Hypertension Yes    Intervention Provide education on lifestyle modifcations including regular physical activity/exercise, weight management, moderate sodium restriction and increased consumption of fresh fruit, vegetables, and low fat dairy, alcohol moderation, and smoking cessation.;Monitor prescription use compliance.    Expected Outcomes Short Term: Continued assessment and intervention until BP is < 140/66mm HG in hypertensive participants. < 130/52mm HG in hypertensive participants with diabetes, heart failure or chronic kidney disease.;Long Term: Maintenance of blood pressure at goal levels.    Personal Goal Other Yes    Personal Goal Patient wants to improve his balance and wallking ability and decrease his SOB with activity.    Intervention Patient will attend PR 3 days/week with exercise and education.    Expected Outcomes Patient will complete the program meeting both personal and program goals.             Core Components/Risk Factors/Patient Goals Review:     Core Components/Risk Factors/Patient Goals at Discharge (Final Review):    ITP Comments:   Comments: Patient arrived for 1st visit/orientation/education at 1230. Patient was referred to PR by Dr. Dietrich Pates due to Chronic congestive heart failure unspecified heart failure type (I50.9). During orientation advised patient on arrival and appointment times what to wear, what to do before, during and after exercise. Reviewed attendance and class policy.  Pt is scheduled to return Pulmonary Rehab on 03/12/23 at 1500. Pt was advised to come to class 15 minutes before class starts.  Discussed RPE/Dpysnea scales. Patient participated in warm up stretches. Patient was able to complete 6 minute walk test.  Patient was measured for the equipment. Discussed equipment safety with patient. Took patient pre-anthropometric measurements. Patient finished visit at 1345.

## 2023-03-12 ENCOUNTER — Encounter (HOSPITAL_COMMUNITY)
Admission: RE | Admit: 2023-03-12 | Discharge: 2023-03-12 | Disposition: A | Discharge status: Home / Self Care | Payer: HMO | Source: Ambulatory Visit | Attending: Internal Medicine | Admitting: Internal Medicine

## 2023-03-12 DIAGNOSIS — I509 Heart failure, unspecified: Secondary | ICD-10-CM | POA: Insufficient documentation

## 2023-03-13 LAB — GLUCOSE, CAPILLARY: Glucose-Capillary: 104 mg/dL — ABNORMAL HIGH (ref 70–99)

## 2023-03-14 ENCOUNTER — Encounter (HOSPITAL_COMMUNITY)
Admission: RE | Admit: 2023-03-14 | Discharge: 2023-03-14 | Disposition: A | Discharge status: Home / Self Care | Payer: HMO | Source: Ambulatory Visit | Attending: Internal Medicine | Admitting: Internal Medicine

## 2023-03-14 DIAGNOSIS — I509 Heart failure, unspecified: Secondary | ICD-10-CM | POA: Diagnosis not present

## 2023-03-14 LAB — GLUCOSE, CAPILLARY: Glucose-Capillary: 95 mg/dL (ref 70–99)

## 2023-03-14 NOTE — Progress Notes (Signed)
Daily Session Note  Patient Details  Name: ZAKAR KANAS MRN: [id number] Date of Birth: 08/11/1951 Referring Provider:   Flowsheet Row PULMONARY REHAB OTHER RESP ORIENTATION from 03/08/2023 in [facility] CARDIAC REHABILITATION  Referring Provider Dr. Dietrich Pates       Encounter Date: 03/14/2023  Check In:  Session Check In - 03/14/23 1444       Check-In   Supervising physician immediately available to respond to emergencies [facility] MD immediately available    Physician(s) Dr Jenene Slicker    Location [facility]    Staff Present Ross Ludwig, BS, Exercise Physiologist;Wilbern Pennypacker Daphine Deutscher, RN, BSN    Virtual Visit No    Medication changes reported     No    Fall or balance concerns reported    Yes    Comments Patient reports poor balance and exhibits shuffling gait.    Tobacco Cessation No Change    Warm-up and Cool-down Performed as group-led instruction    Resistance Training Performed Yes      Pain Assessment   Currently in Pain? No/denies             Capillary Blood Glucose: No results found for this or any previous visit (from the past 24 hour(s)).    Social History   Tobacco Use  Smoking Status Former   Types: Cigarettes   Start date: 10/25/1966   Quit date: 10/25/1998   Years since quitting: 24.4  Smokeless Tobacco Never    Goals Met:  Proper associated with RPD/PD & O2 Sat Independence with exercise equipment Using PLB without cueing & demonstrates good technique Exercise tolerated well Queuing for purse lip breathing No report of concerns or symptoms today Strength training completed today  Goals Unmet:  Not Applicable  Comments: Checkout at 1600.   Dr. Erick Blinks is Medical Director for [facility].

## 2023-03-19 ENCOUNTER — Encounter (HOSPITAL_COMMUNITY)
Admission: RE | Admit: 2023-03-19 | Discharge: 2023-03-19 | Disposition: A | Discharge status: Home / Self Care | Payer: HMO | Source: Ambulatory Visit | Attending: Internal Medicine | Admitting: Internal Medicine

## 2023-03-19 DIAGNOSIS — I509 Heart failure, unspecified: Secondary | ICD-10-CM

## 2023-03-19 LAB — GLUCOSE, CAPILLARY
Glucose-Capillary: 89 mg/dL (ref 70–99)
Glucose-Capillary: 106 mg/dL — ABNORMAL HIGH (ref 70–99)

## 2023-03-19 NOTE — Progress Notes (Signed)
Daily Session Note  Patient Details  Name: Jeremy Lopez MRN: [id number] Date of Birth: Feb 22, 1951 Referring Provider:   Flowsheet Row PULMONARY REHAB OTHER RESP ORIENTATION from 03/08/2023 in [facility] CARDIAC REHABILITATION  Referring Provider Dr. Dietrich Pates       Encounter Date: 03/19/2023  Check In:  Session Check In - 03/19/23 1500       Check-In   Supervising physician immediately available to respond to emergencies [facility] MD immediately available    Physician(s) Dr. Wyline Mood    Location [facility]    Staff Present Erskine Speed, RN;Daphyne Daphine Deutscher, RN, BSN    Virtual Visit No    Medication changes reported     No    Fall or balance concerns reported    Yes    Comments Patient reports poor balance and exhibits shuffling gait.    Tobacco Cessation No Change    Warm-up and Cool-down Performed as group-led instruction    Resistance Training Performed Yes    VAD Patient? No    PAD/SET Patient? No      Pain Assessment   Currently in Pain? No/denies    Pain Score 0-No pain    Multiple Pain Sites No             Capillary Blood Glucose: Results for orders placed or performed during the hospital encounter of 03/19/23 (from the past 24 hour(s))  Glucose, capillary     Status: None   Collection Time: 03/19/23  2:55 PM  Result Value Ref Range   Glucose-Capillary 89 70 - 99 mg/dL      Social History   Tobacco Use  Smoking Status Former   Types: Cigarettes   Start date: 10/25/1966   Quit date: 10/25/1998   Years since quitting: 24.4  Smokeless Tobacco Never    Goals Met:  Independence with exercise equipment Using PLB without cueing & demonstrates good technique Exercise tolerated well No report of concerns or symptoms today Strength training completed today  Goals Unmet:  Not Applicable  Comments: checkout @ 4:00pm   Dr. Erick Blinks is Medical Director for [facility].

## 2023-03-21 ENCOUNTER — Encounter (HOSPITAL_COMMUNITY)
Admission: RE | Admit: 2023-03-21 | Discharge: 2023-03-21 | Disposition: A | Discharge status: Home / Self Care | Payer: HMO | Source: Ambulatory Visit | Attending: Internal Medicine | Admitting: Internal Medicine

## 2023-03-21 DIAGNOSIS — I509 Heart failure, unspecified: Secondary | ICD-10-CM

## 2023-03-21 LAB — GLUCOSE, CAPILLARY: Glucose-Capillary: 101 mg/dL — ABNORMAL HIGH (ref 70–99)

## 2023-03-21 NOTE — Progress Notes (Signed)
Daily Session Note  Patient Details  Name: DEIONTAE RABEL MRN: [id number] Date of Birth: [birth year] Referring Provider:   Flowsheet Row PULMONARY REHAB OTHER RESP ORIENTATION from 03/08/2023 in [facility] CARDIAC REHABILITATION  Referring Provider Dr. Dietrich Pates       Encounter Date: 03/21/2023  Check In:  Session Check In - 03/21/23 1449       Check-In   Supervising physician immediately available to respond to emergencies [facility] MD immediately available    Physician(s) Dr. Wyline Mood    Location [facility]    Staff Present Ross Ludwig, BS, Exercise Physiologist;Kiandra Sanguinetti Daphine Deutscher, RN, BSN;Hillary Troutman BSN, RN    Virtual Visit No    Medication changes reported     No    Fall or balance concerns reported    Yes    Comments Patient reports poor balance and exhibits shuffling gait.    Tobacco Cessation No Change    Warm-up and Cool-down Performed as group-led instruction    Resistance Training Performed Yes      Pain Assessment   Currently in Pain? No/denies    Pain Score 0-No pain             Capillary Blood Glucose: No results found for this or any previous visit (from the past 24 hour(s)).    Social History   Tobacco Use  Smoking Status Former   Types: Cigarettes   Start date: 10/25/1966   Quit date: 10/25/1998   Years since quitting: 24.4  Smokeless Tobacco Never    Goals Met:  Proper associated with RPD/PD & O2 Sat Independence with exercise equipment Using PLB without cueing & demonstrates good technique Exercise tolerated well Queuing for purse lip breathing No report of concerns or symptoms today Strength training completed today  Goals Unmet:  Not Applicable  Comments: Check out at 1600.   Dr. Erick Blinks is Medical Director for [facility].

## 2023-03-26 ENCOUNTER — Encounter (HOSPITAL_COMMUNITY)
Admission: RE | Admit: 2023-03-26 | Discharge: 2023-03-26 | Disposition: A | Discharge status: Home / Self Care | Payer: HMO | Source: Ambulatory Visit | Attending: Internal Medicine | Admitting: Internal Medicine

## 2023-03-26 DIAGNOSIS — I509 Heart failure, unspecified: Secondary | ICD-10-CM

## 2023-03-26 NOTE — Progress Notes (Signed)
Daily Session Note  Patient Details  Name: Jeremy Lopez MRN: [id number] Date of Birth: [birth year] Referring Provider:   Flowsheet Row PULMONARY REHAB OTHER RESP ORIENTATION from 03/08/2023 in [facility] CARDIAC REHABILITATION  Referring Provider Dr. Dietrich Pates       Encounter Date: 03/26/2023  Check In:  Session Check In - 03/26/23 1500       Check-In   Supervising physician immediately available to respond to emergencies [facility] MD immediately available    Physician(s) Dr Diona Browner    Location [facility]    Staff Present Ross Ludwig, BS, Exercise Physiologist;Danny Gala Romney, RN, BSN    Virtual Visit No    Medication changes reported     No    Fall or balance concerns reported    Yes    Comments Patient reports poor balance and exhibits shuffling gait.    Tobacco Cessation No Change    Warm-up and Cool-down Performed as group-led instruction    Resistance Training Performed Yes      Pain Assessment   Currently in Pain? No/denies    Pain Score 0-No pain             Capillary Blood Glucose: No results found for this or any previous visit (from the past 24 hour(s)).    Social History   Tobacco Use  Smoking Status Former   Types: Cigarettes   Start date: 10/25/1966   Quit date: 10/25/1998   Years since quitting: 24.4  Smokeless Tobacco Never    Goals Met:  Proper associated with RPD/PD & O2 Sat Independence with exercise equipment Using PLB without cueing & demonstrates good technique Exercise tolerated well Queuing for purse lip breathing No report of concerns or symptoms today Strength training completed today  Goals Unmet:  Not Applicable  Comments: Checkout at 1600.   Dr. Erick Blinks is Medical Director for [facility].

## 2023-03-27 NOTE — Progress Notes (Signed)
Pulmonary Individual Treatment Plan  Patient Details  Name: Jeremy Lopez MRN: [id number] Date of Birth: [birth year] Referring Provider:   Flowsheet Row PULMONARY REHAB OTHER RESP ORIENTATION from 03/08/2023 in [facility] CARDIAC REHABILITATION  Referring Provider Dr. Dietrich Pates       Initial Encounter Date:  Flowsheet Row PULMONARY REHAB OTHER RESP ORIENTATION from 03/08/2023 in [facility] CARDIAC REH[facility]ILITATION  Date 03/08/23       Visit Diagnosis: Chronic congestive heart failure, unspecified heart failure type (HCC)  Patient's Home Medications on Admission:   Current Outpatient Medications:    albuterol (VENTOLIN HFA) 108 (90 Base) MCG/ACT inhaler, Inhale 2 puffs into the lungs every 6 (six) hours as needed for wheezing or shortness of breath., Disp: , Rfl:    Cholecalciferol (VITAMIN D) 50 MCG (2000 UT) tablet, Take 2,000 Units by mouth daily., Disp: , Rfl:    clopidogrel (PLAVIX) 75 MG tablet, Take 1 tablet (75 mg total) by mouth daily with breakfast., Disp: 90 tablet, Rfl: 3   diltiazem (DILACOR XR) 240 MG 24 hr capsule, Take 1 capsule (240 mg total) by mouth daily., Disp: 90 capsule, Rfl: 3   empagliflozin (JARDIANCE) 10 MG TABS tablet, Take 1 tablet (10 mg total) by mouth daily before breakfast., Disp: 30 tablet, Rfl: 11   fluticasone (FLONASE) 50 MCG/ACT nasal spray, Place 2 sprays into both nostrils once a week. (Patient not taking: Reported on 03/07/2023), Disp: , Rfl:    Fluticasone-Umeclidin-Vilant (TRELEGY ELLIPTA) 100-62.5-25 MCG/ACT AEPB, Inhale 1 puff into the lungs daily., Disp: 180 each, Rfl: 3   gabapentin (NEURONTIN) 300 MG capsule, Take 1 capsule (300 mg total) by mouth at bedtime., Disp: 90 capsule, Rfl: 3   metoprolol succinate (TOPROL-XL) 100 MG 24 hr tablet, TAKE ONE TABLET (100MG  TOTAL) BY MOUTH DAILY. TAKE WITH OR IMMEDIATELY FOLLOWING A MEAL., Disp: 90 tablet, Rfl: 2   nitroGLYCERIN (NITROSTAT) 0.4 MG SL tablet, Place 1 tablet (0.4 mg total) under the tongue  every 5 (five) minutes as needed for chest pain., Disp: 25 tablet, Rfl: 2   prochlorperazine (COMPAZINE) 5 MG tablet, Take 1 tablet (5 mg total) by mouth every 6 (six) hours as needed for nausea or vomiting., Disp: 30 tablet, Rfl: 0   rosuvastatin (CRESTOR) 40 MG tablet, TAKE ONE (1) TABLET BY MOUTH EVERY DAY, Disp: 90 tablet, Rfl: 3   sacubitril-valsartan (ENTRESTO) 49-51 MG, Take 1 tablet by mouth 2 (two) times daily., Disp: 60 tablet, Rfl: 11   Spacer/Aero-Holding Chambers DEVI, Use with inhaler, Disp: 1 each, Rfl: 2  Current Facility-Administered Medications:    sodium chloride flush (NS) 0.9 % injection 3 mL, 3 mL, Intravenous, Q12H, Dunn, Tacey Ruiz, PA-C  Past Medical History: Past Medical History:  Diagnosis Date   CAD (coronary artery disease), native coronary artery    DES proximal LAD 07/2009; DES to RCA 04/2020   Essential hypertension    Habitual alcohol use    Hyperlipidemia    Small cell lung cancer (HCC)    Right upper lobe 2008; no evidence of disease since treatment ended in 11/2007    Tobacco Use: Social History   Tobacco Use  Smoking Status Former   Types: Cigarettes   Start date: 10/25/1966   Quit date: 10/25/1998   Years since quitting: 24.4  Smokeless Tobacco Never    Labs: Review Flowsheet  More data exists      Latest Ref Rng & Units 12/28/2019 04/26/2020 01/02/2021 06/13/2022 01/18/2023  Labs for ITP Cardiac and Pulmonary Rehab  Cholestrol 100 - 199 mg/dL 161  - 096  045  409   LDL (calc) 0 - 99 mg/dL 61  - 49  40  76   HDL-C >39 mg/dL 42  - 49  51  54   Trlycerides 0 - 149 mg/dL 811  - 914  782  956   Hemoglobin A1c 4.8 - 5.6 % - - - - 5.8   PH, Arterial 7.350 - 7.450 - 7.386  - - -  PCO2 arterial 32.0 - 48.0 mmHg - 45.1  - - -  Bicarbonate 20.0 - 28.0 mmol/L - 27.0  25.1  - - -  TCO2 22 - 32 mmol/L - 28  26  - - -  O2 Saturation % - 72.0  94.0  - - -    Capillary Blood Glucose: Lab Results  Component Value Date   GLUCAP 101 (H) 03/21/2023   GLUCAP  106 (H) 03/19/2023   GLUCAP 89 03/19/2023   GLUCAP 95 03/14/2023   GLUCAP 104 (H) 03/12/2023     Pulmonary Assessment Scores:  Pulmonary Assessment Scores     Row Name 03/08/23 1345         ADL UCSD   ADL Phase Entry     SOB Score total 15     Rest 0     Walk 1     Stairs 1     Bath 0     Dress 0     Shop 2       CAT Score   CAT Score 14       mMRC Score   mMRC Score 2             UCSD: Self-administered rating of dyspnea associated with activities of daily living (ADLs) 6-point scale (0 = "not at all" to 5 = "maximal or unable to do because of breathlessness")  Scoring Scores range from 0 to 120.  Minimally important difference is 5 units  CAT: CAT can identify the health impairment of COPD patients and is better correlated with disease progression.  CAT has a scoring range of zero to 40. The CAT score is classified into four groups of low (less than 10), medium (10 - 20), high (21-30) and very high (31-40) based on the impact level of disease on health status. A CAT score over 10 suggests significant symptoms.  A worsening CAT score could be explained by an exacerbation, poor medication adherence, poor inhaler technique, or progression of COPD or comorbid conditions.  CAT MCID is 2 points  mMRC: mMRC (Modified Medical Research Council) Dyspnea Scale is used to assess the degree of baseline functional disability in patients of respiratory disease due to dyspnea. No minimal important difference is established. A decrease in score of 1 point or greater is considered a positive change.   Pulmonary Function Assessment:   Exercise Target Goals: Exercise Program Goal: Individual exercise prescription set using results from initial 6 min walk test and THRR while considering  patient's activity barriers and safety.   Exercise Prescription Goal: Initial exercise prescription builds to 30-45 minutes a day of aerobic activity, 2-3 days per week.  Home exercise  guidelines will be given to patient during program as part of exercise prescription that the participant will acknowledge.  Activity Barriers & Risk Stratification:  Activity Barriers & Cardiac Risk Stratification - 03/08/23 1246       Activity Barriers & Cardiac Risk Stratification   Activity Barriers Shortness of Breath  Cardiac Risk Stratification High             6 Minute Walk:  6 Minute Walk     Row Name 03/08/23 1346         6 Minute Walk   Phase Initial     Distance 1200 feet     Walk Time 6 minutes     # of Rest Breaks 0     MPH 2.27     METS 2.63     RPE 11     Perceived Dyspnea  11     VO2 Peak 9.2     Symptoms No     Resting HR 75 bpm     Resting BP 120/68     Resting Oxygen Saturation  95 %     Exercise Oxygen Saturation  during 6 min walk 93 %     Max Ex. HR 103 bpm     Max Ex. BP 148/76     2 Minute Post BP 124/76       Interval HR   1 Minute HR 95     2 Minute HR 100     3 Minute HR 100     4 Minute HR 101     5 Minute HR 102     6 Minute HR 103     2 Minute Post HR 83     Interval Heart Rate? Yes       Interval Oxygen   Interval Oxygen? Yes     Baseline Oxygen Saturation % 95 %     1 Minute Oxygen Saturation % 94 %     1 Minute Liters of Oxygen 0 L     2 Minute Oxygen Saturation % 93 %     2 Minute Liters of Oxygen 0 L     3 Minute Oxygen Saturation % 94 %     3 Minute Liters of Oxygen 0 L     4 Minute Oxygen Saturation % 94 %     4 Minute Liters of Oxygen 0 L     5 Minute Oxygen Saturation % 94 %     5 Minute Liters of Oxygen 0 L     6 Minute Oxygen Saturation % 94 %     6 Minute Liters of Oxygen 0 L     2 Minute Post Oxygen Saturation % 96 %     2 Minute Post Liters of Oxygen 0 L              Oxygen Initial Assessment:  Oxygen Initial Assessment - 03/08/23 1345       Home Oxygen   Home Oxygen Device None    Sleep Oxygen Prescription None    Home Exercise Oxygen Prescription None    Home Resting Oxygen Prescription  None      Initial 6 min Walk   Oxygen Used None      Program Oxygen Prescription   Program Oxygen Prescription None      Intervention   Short Term Goals To learn and understand importance of monitoring SPO2 with pulse oximeter and demonstrate accurate use of the pulse oximeter.;To learn and understand importance of maintaining oxygen saturations>88%;To learn and demonstrate proper pursed lip breathing techniques or other breathing techniques.     Long  Term Goals Verbalizes importance of monitoring SPO2 with pulse oximeter and return demonstration;Maintenance of O2 saturations>88%;Exhibits proper breathing techniques, such as pursed lip breathing or other method taught during program session;Compliance  with respiratory medication             Oxygen Re-Evaluation:  Oxygen Re-Evaluation     Row Name 03/26/23 1300             Program Oxygen Prescription   Program Oxygen Prescription None         Home Oxygen   Home Oxygen Device None       Sleep Oxygen Prescription None       Home Exercise Oxygen Prescription None       Home Resting Oxygen Prescription None         Goals/Expected Outcomes   Short Term Goals To learn and understand importance of monitoring SPO2 with pulse oximeter and demonstrate accurate use of the pulse oximeter.;To learn and understand importance of maintaining oxygen saturations>88%;To learn and demonstrate proper pursed lip breathing techniques or other breathing techniques.        Long  Term Goals Verbalizes importance of monitoring SPO2 with pulse oximeter and return demonstration;Maintenance of O2 saturations>88%;Exhibits proper breathing techniques, such as pursed lip breathing or other method taught during program session;Compliance with respiratory medication                Oxygen Discharge (Final Oxygen Re-Evaluation):  Oxygen Re-Evaluation - 03/26/23 1300       Program Oxygen Prescription   Program Oxygen Prescription None      Home Oxygen    Home Oxygen Device None    Sleep Oxygen Prescription None    Home Exercise Oxygen Prescription None    Home Resting Oxygen Prescription None      Goals/Expected Outcomes   Short Term Goals To learn and understand importance of monitoring SPO2 with pulse oximeter and demonstrate accurate use of the pulse oximeter.;To learn and understand importance of maintaining oxygen saturations>88%;To learn and demonstrate proper pursed lip breathing techniques or other breathing techniques.     Long  Term Goals Verbalizes importance of monitoring SPO2 with pulse oximeter and return demonstration;Maintenance of O2 saturations>88%;Exhibits proper breathing techniques, such as pursed lip breathing or other method taught during program session;Compliance with respiratory medication             Initial Exercise Prescription:  Initial Exercise Prescription - 03/08/23 1400       Date of Initial Exercise RX and Referring Provider   Date 03/08/23    Referring Provider Dr. Dietrich Pates    Expected Discharge Date 07/11/23      Treadmill   MPH 1.6    Grade 0    Minutes 17      NuStep   Level 1    SPM 80    Minutes 22      Prescription Details   Frequency (times per week) 2    Duration Progress to 30 minutes of continuous aerobic without signs/symptoms of physical distress      Intensity   THRR 40-80% of Max Heartrate 60-119    Ratings of Perceived Exertion 11-13    Perceived Dyspnea 0-4      Resistance Training   Training Prescription Yes    Weight 5    Reps 10-15             Perform Capillary Blood Glucose checks as needed.  Exercise Prescription Changes:   Exercise Prescription Changes     Row Name 03/12/23 1500 03/26/23 1500           Response to Exercise   Blood Pressure (Admit) 130/72 112/78  Blood Pressure (Exercise) 170/78 140/70      Blood Pressure (Exit) 130/64 120/60      Heart Rate (Admit) 88 bpm 80 bpm      Heart Rate (Exercise) 100 bpm 102 bpm       Heart Rate (Exit) 84 bpm 86 bpm      Oxygen Saturation (Admit) 94 % 96 %      Oxygen Saturation (Exercise) 95 % 95 %      Oxygen Saturation (Exit) 95 % 95 %      Rating of Perceived Exertion (Exercise) 11 9      Perceived Dyspnea (Exercise) 12 9      Duration Continue with 30 min of aerobic exercise without signs/symptoms of physical distress. Continue with 30 min of aerobic exercise without signs/symptoms of physical distress.      Intensity THRR unchanged THRR unchanged        Progression   Progression Continue to progress workloads to maintain intensity without signs/symptoms of physical distress. Continue to progress workloads to maintain intensity without signs/symptoms of physical distress.        Resistance Training   Training Prescription Yes Yes      Weight 3 5      Reps 10-15 10-15      Time 10 Minutes 10 Minutes        Treadmill   MPH 1.3 1.5      Grade 0 0      Minutes 17 17      METs 2 2.15        NuStep   Level 1 2      SPM 78 85      Minutes 22 22      METs 1.6 1.6               Exercise Comments:   Exercise Goals and Review:   Exercise Goals     Row Name 03/08/23 1403 03/26/23 1248           Exercise Goals   Increase Physical Activity Yes Yes      Intervention Provide advice, education, support and counseling about physical activity/exercise needs.;Develop an individualized exercise prescription for aerobic and resistive training based on initial evaluation findings, risk stratification, comorbidities and participant's personal goals. Provide advice, education, support and counseling about physical activity/exercise needs.;Develop an individualized exercise prescription for aerobic and resistive training based on initial evaluation findings, risk stratification, comorbidities and participant's personal goals.      Expected Outcomes Short Term: Attend rehab on a regular basis to increase amount of physical activity.;Long Term: Add in home exercise to  make exercise part of routine and to increase amount of physical activity.;Long Term: Exercising regularly at least 3-5 days a week. Short Term: Attend rehab on a regular basis to increase amount of physical activity.;Long Term: Add in home exercise to make exercise part of routine and to increase amount of physical activity.;Long Term: Exercising regularly at least 3-5 days a week.      Increase Strength and Stamina Yes Yes      Intervention Provide advice, education, support and counseling about physical activity/exercise needs.;Develop an individualized exercise prescription for aerobic and resistive training based on initial evaluation findings, risk stratification, comorbidities and participant's personal goals. Provide advice, education, support and counseling about physical activity/exercise needs.;Develop an individualized exercise prescription for aerobic and resistive training based on initial evaluation findings, risk stratification, comorbidities and participant's personal goals.      Expected Outcomes Short  Term: Increase workloads from initial exercise prescription for resistance, speed, and METs.;Short Term: Perform resistance training exercises routinely during rehab and add in resistance training at home;Long Term: Improve cardiorespiratory fitness, muscular endurance and strength as measured by increased METs and functional capacity ( ) Short Term: Increase workloads from initial exercise prescription for resistance, speed, and METs.;Short Term: Perform resistance training exercises routinely during rehab and add in resistance training at home;Long Term: Improve cardiorespiratory fitness, muscular endurance and strength as measured by increased METs and functional capacity ( )      Able to understand and use rate of perceived exertion (RPE) scale Yes Yes      Intervention Provide education and explanation on how to use RPE scale Provide education and explanation on how to use RPE scale       Expected Outcomes Short Term: Able to use RPE daily in rehab to express subjective intensity level;Long Term:  Able to use RPE to guide intensity level when exercising independently Short Term: Able to use RPE daily in rehab to express subjective intensity level;Long Term:  Able to use RPE to guide intensity level when exercising independently      Able to understand and use Dyspnea scale Yes Yes      Intervention Provide education and explanation on how to use Dyspnea scale Provide education and explanation on how to use Dyspnea scale      Expected Outcomes Short Term: Able to use Dyspnea scale daily in rehab to express subjective sense of shortness of breath during exertion;Long Term: Able to use Dyspnea scale to guide intensity level when exercising independently Short Term: Able to use Dyspnea scale daily in rehab to express subjective sense of shortness of breath during exertion;Long Term: Able to use Dyspnea scale to guide intensity level when exercising independently      Knowledge and understanding of Target Heart Rate Range (THRR) Yes Yes      Intervention Provide education and explanation of THRR including how the numbers were predicted and where they are located for reference Provide education and explanation of THRR including how the numbers were predicted and where they are located for reference      Expected Outcomes Short Term: Able to state/look up THRR;Short Term: Able to use daily as guideline for intensity in rehab;Long Term: Able to use THRR to govern intensity when exercising independently Short Term: Able to state/look up THRR;Short Term: Able to use daily as guideline for intensity in rehab;Long Term: Able to use THRR to govern intensity when exercising independently      Able to check pulse independently -- Yes      Understanding of Exercise Prescription Yes Yes      Intervention Provide education, explanation, and written materials on patient's individual exercise prescription  Provide education, explanation, and written materials on patient's individual exercise prescription      Expected Outcomes Short Term: Able to explain program exercise prescription;Long Term: Able to explain home exercise prescription to exercise independently Short Term: Able to explain program exercise prescription;Long Term: Able to explain home exercise prescription to exercise independently               Exercise Goals Re-Evaluation :  Exercise Goals Re-Evaluation     Row Name 03/26/23 1249             Exercise Goal Re-Evaluation   Exercise Goals Review Increase Physical Activity;Increase Strength and Stamina;Able to understand and use rate of perceived exertion (RPE) scale;Able to understand and use Dyspnea scale;Knowledge  and understanding of Target Heart Rate Range (THRR);Understanding of Exercise Prescription       Comments Pt has completed 4 sessions of pulmoanry rehab. He is tolerating exercise and progressing his workloads. He is currently exercising at 2.15 METs on the treadmill. Will continue to monitor and progress as able.       Expected Outcomes Through exercising at rehab and a home exercise plan, patient will achieve his goals.                Discharge Exercise Prescription (Final Exercise Prescription Changes):  Exercise Prescription Changes - 03/26/23 1500       Response to Exercise   Blood Pressure (Admit) 112/78    Blood Pressure (Exercise) 140/70    Blood Pressure (Exit) 120/60    Heart Rate (Admit) 80 bpm    Heart Rate (Exercise) 102 bpm    Heart Rate (Exit) 86 bpm    Oxygen Saturation (Admit) 96 %    Oxygen Saturation (Exercise) 95 %    Oxygen Saturation (Exit) 95 %    Rating of Perceived Exertion (Exercise) 9    Perceived Dyspnea (Exercise) 9    Duration Continue with 30 min of aerobic exercise without signs/symptoms of physical distress.    Intensity THRR unchanged      Progression   Progression Continue to progress workloads to maintain  intensity without signs/symptoms of physical distress.      Resistance Training   Training Prescription Yes    Weight 5    Reps 10-15    Time 10 Minutes      Treadmill   MPH 1.5    Grade 0    Minutes 17    METs 2.15      NuStep   Level 2    SPM 85    Minutes 22    METs 1.6             Nutrition:  Target Goals: Understanding of nutrition guidelines, daily intake of sodium 1500mg , cholesterol 200mg , calories 30% from fat and 7% or less from saturated fats, daily to have 5 or more servings of fruits and vegetables.  Biometrics:  Pre Biometrics - 03/08/23 1404       Pre Biometrics   Height 5\' 10"  (1.778 m)    Weight 99.5 kg    Waist Circumference 46.5 inches    Hip Circumference 44.5 inches    Waist to Hip Ratio 1.04 %    BMI (Calculated) 31.47    Triceps Skinfold 15 mm    % Body Fat 31.5 %    Grip Strength 39.8 kg    Flexibility 0 in    Single Leg Stand 8 seconds              Nutrition Therapy Plan and Nutrition Goals:  Nutrition Therapy & Goals - 03/08/23 1323       Nutrition Therapy   RD appointment deferred Yes      Personal Nutrition Goals   Comments Patient's diet assessment score was15. Handout explained and provided regarding healthier choices. We provide educational sessions on heart healthy nutrition and assistance with RD referral if patient is interested.      Intervention Plan   Intervention Nutrition handout(s) given to patient.    Expected Outcomes Short Term Goal: Understand basic principles of dietary content, such as calories, fat, sodium, cholesterol and nutrients.             Nutrition Assessments:  Nutrition Assessments - 03/08/23 1323  MEDFICTS Scores   Pre Score 15            MEDIFICTS Score Key: ?70 Need to make dietary changes  40-70 Heart Healthy Diet ? 40 Therapeutic Level Cholesterol Diet   Picture Your Plate Scores: <13 Unhealthy dietary pattern with much room for improvement. 41-50 Dietary  pattern unlikely to meet recommendations for good health and room for improvement. 51-60 More healthful dietary pattern, with some room for improvement.  >60 Healthy dietary pattern, although there may be some specific behaviors that could be improved.    Nutrition Goals Re-Evaluation:   Nutrition Goals Discharge (Final Nutrition Goals Re-Evaluation):   Psychosocial: Target Goals: Acknowledge presence or absence of significant depression and/or stress, maximize coping skills, provide positive support system. Participant is able to verbalize types and ability to use techniques and skills needed for reducing stress and depression.  Initial Review & Psychosocial Screening:  Initial Psych Review & Screening - 03/08/23 1327       Initial Review   Current issues with None Identified      Family Dynamics   Good Support System? Yes      Barriers   Psychosocial barriers to participate in program There are no identifiable barriers or psychosocial needs.;The patient should benefit from training in stress management and relaxation.      Screening Interventions   Interventions Encouraged to exercise;To provide support and resources with identified psychosocial needs    Expected Outcomes Short Term goal: Identification and review with participant of any Quality of Life or Depression concerns found by scoring the questionnaire.;Short Term goal: Utilizing psychosocial counselor, staff and physician to assist with identification of specific Stressors or current issues interfering with healing process. Setting desired goal for each stressor or current issue identified.             Quality of Life Scores:  Quality of Life - 03/08/23 1345       Quality of Life   Select Quality of Life      Quality of Life Scores   Health/Function Pre 21.38 %    Socioeconomic Pre 29 %    Psych/Spiritual Pre 30 %    Family Pre 27.6 %    GLOBAL Pre 25.41 %            Scores of 19 and below usually  indicate a poorer quality of life in these areas.  A difference of  2-3 points is a clinically meaningful difference.  A difference of 2-3 points in the total score of the Quality of Life Index has been associated with significant improvement in overall quality of life, self-image, physical symptoms, and general health in studies assessing change in quality of life.   PHQ-9: Review Flowsheet       03/08/2023 01/18/2023 10/21/2020 07/14/2020  Depression screen PHQ 2/9  Decreased Interest 0 0 0 0  Down, Depressed, Hopeless 0 0 0 0  PHQ - 2 Score 0 0 0 0  Altered sleeping 1 3 0 0  Tired, decreased energy 1 3 0 1  Change in appetite 0 0 0 0  Feeling bad or failure about yourself  0 0 0 0  Trouble concentrating 0 0 0 0  Moving slowly or fidgety/restless 0 0 0 0  Suicidal thoughts 0 0 0 0  PHQ-9 Score 2 6 0 1  Difficult doing work/chores Not difficult at all - Not difficult at all Not difficult at all   Interpretation of Total Score  Total Score Depression  Severity:  1-4 = Minimal depression, 5-9 = Mild depression, 10-14 = Moderate depression, 15-19 = Moderately severe depression, 20-27 = Severe depression   Psychosocial Evaluation and Intervention:  Psychosocial Evaluation - 03/08/23 1352       Psychosocial Evaluation & Interventions   Interventions Stress management education;Relaxation education;Encouraged to exercise with the program and follow exercise prescription    Comments Patient has no psychosocial barriers or issues identified at his orientation visit. His PHQ-9 score was 2 due to lack of energy and trouble staying alseep at times. He denies any depression or anxiety. He has participated in cardiac rehab with [place] and is very familiar with our staff and the program. He is a retired Occupational hygienist. He has a great support system with his wife, 2 children and several grandchildren and he has a brother and several friends that also support him. He is very motivated to get back into an exercise  routine and is ready to start the program.    Expected Outcomes Patient will have no psychosocial issues identified.    Continue Psychosocial Services  No Follow up required             Psychosocial Re-Evaluation:  Psychosocial Re-Evaluation     Row Name 03/19/23 1313             Psychosocial Re-Evaluation   Current issues with None Identified       Comments Patient is new to PR program attending 2 session.  He was referred to PR by Dr. Tenny Craw with Chronic heart failure.  Patient initial QOL score is 25.41% and his PHQ-9 score is 2.  He is very pleasant and interactive with class and staff.  We will continue to monitor his progress as he works toward meeting these goals.       Expected Outcomes Patient will have no psychological issues identified at discharge       Interventions Stress management education;Relaxation education;Encouraged to attend Pulmonary Rehabilitation for the exercise       Continue Psychosocial Services  No Follow up required                Psychosocial Discharge (Final Psychosocial Re-Evaluation):  Psychosocial Re-Evaluation - 03/19/23 1313       Psychosocial Re-Evaluation   Current issues with None Identified    Comments Patient is new to PR program attending 2 session.  He was referred to PR by Dr. Tenny Craw with Chronic heart failure.  Patient initial QOL score is 25.41% and his PHQ-9 score is 2.  He is very pleasant and interactive with class and staff.  We will continue to monitor his progress as he works toward meeting these goals.    Expected Outcomes Patient will have no psychological issues identified at discharge    Interventions Stress management education;Relaxation education;Encouraged to attend Pulmonary Rehabilitation for the exercise    Continue Psychosocial Services  No Follow up required              Education: Education Goals: Education classes will be provided on a weekly basis, covering required topics. Participant will state  understanding/return demonstration of topics presented.  Learning Barriers/Preferences:  Learning Barriers/Preferences - 03/08/23 1324       Learning Barriers/Preferences   Learning Barriers None    Learning Preferences Written Material             Education Topics: How Lungs Work and Diseases: - Discuss the anatomy of the lungs and diseases that can affect the lungs, such as  COPD.   Exercise: -Discuss the importance of exercise, FITT principles of exercise, normal and abnormal responses to exercise, and how to exercise safely.   Environmental Irritants: -Discuss types of environmental irritants and how to limit exposure to environmental irritants.   Meds/Inhalers and oxygen: - Discuss respiratory medications, definition of an inhaler and oxygen, and the proper way to use an inhaler and oxygen.   Energy Saving Techniques: - Discuss methods to conserve energy and decrease shortness of breath when performing activities of daily living.    Bronchial Hygiene / Breathing Techniques: - Discuss breathing mechanics, pursed-lip breathing technique,  proper posture, effective ways to clear airways, and other functional breathing techniques   Cleaning Equipment: - Provides group verbal and written instruction about the health risks of elevated stress, cause of high stress, and healthy ways to reduce stress.   Nutrition I: Fats: - Discuss the types of cholesterol, what cholesterol does to the body, and how cholesterol levels can be controlled. Flowsheet Row CARDIAC REHAB PHASE II EXERCISE from 10/05/2020 in [place] CARDIAC REHABILITATION  Date 08/31/20  Educator [facility]  Instruction Review Code 2- Demonstrated Understanding       Nutrition II: Labels: -Discuss the different components of food labels and how to read food labels. Flowsheet Row CARDIAC REHAB PHASE II EXERCISE from 10/05/2020 in [place] CARDIAC REHABILITATION  Date 09/07/20  Educator [facility]  Instruction  Review Code 2- Demonstrated Understanding       Respiratory Infections: - Discuss the signs and symptoms of respiratory infections, ways to prevent respiratory infections, and the importance of seeking medical treatment when having a respiratory infection. Flowsheet Row PULMONARY REHAB OTHER RESPIRATORY from 03/21/2023 in [place] [facility] RE[facility]ABILIT[facility]  Date 03/14/23  Educator handout       Stress I: Signs and Symptoms: - Discuss the causes of stress, how stress may lead to anxiety and depression, and ways to limit stress. Flowsheet Row PULMONARY REHAB OTHER RESPIRATORY from 03/21/2023 in [place] [facility]  Date 03/21/23  Educator handout       Stress II: Relaxation: -Discuss relaxation techniques to limit stress.   Oxygen for Home/Travel: - Discuss how to prepare for travel when on oxygen and proper ways to transport and store oxygen to ensure safety.   Knowledge Questionnaire Score:  Knowledge Questionnaire Score - 03/08/23 1324       Knowledge Questionnaire Score   Pre Score 14/18             Core Components/Risk Factors/Patient Goals at Admission:  Personal Goals and Risk Factors at Admission - 03/08/23 1325       Core Components/Risk Factors/Patient Goals on Admission    Weight Management Obesity    Improve shortness of breath with ADL's Yes    Intervention Provide education, individualized exercise plan and daily activity instruction to help decrease symptoms of SOB with activities of daily living.    Expected Outcomes Short Term: Improve cardiorespiratory fitness to achieve a reduction of symptoms when performing ADLs;Long Term: Be able to perform more ADLs without symptoms or delay the onset of symptoms    Heart Failure Yes    Intervention Provide a combined exercise and nutrition program that is supplemented with education, support and counseling about heart failure. Directed toward relieving symptoms such as shortness of breath,  decreased exercise tolerance, and extremity edema.    Expected Outcomes Short term: Attendance in program 2-3 days a week with increased exercise capacity. Reported lower sodium intake. Reported increased fruit and vegetable  intake. Reports medication compliance.;Long term: Adoption of self-care skills and reduction of barriers for early signs and symptoms recognition and intervention leading to self-care maintenance.    Hypertension Yes    Intervention Provide education on lifestyle modifcations including regular physical activity/exercise, weight management, moderate sodium restriction and increased consumption of fresh fruit, vegetables, and low fat dairy, alcohol moderation, and smoking cessation.;Monitor prescription use compliance.    Expected Outcomes Short Term: Continued assessment and intervention until BP is < 140/22mm HG in hypertensive participants. < 130/63mm HG in hypertensive participants with diabetes, heart failure or chronic kidney disease.;Long Term: Maintenance of blood pressure at goal levels.    Personal Goal Other Yes    Personal Goal Patient wants to improve his balance and wallking ability and decrease his SOB with activity.    Intervention Patient will attend PR 3 days/week with exercise and education.    Expected Outcomes Patient will complete the program meeting both personal and program goals.             Core Components/Risk Factors/Patient Goals Review:   Goals and Risk Factor Review     Row Name 03/19/23 1323             Core Components/Risk Factors/Patient Goals Review   Personal Goals Review Develop more efficient breathing techniques such as purse lipped breathing and diaphragmatic breathing and practicing self-pacing with activity.;Improve shortness of breath with ADL's;Heart Failure       Review Patient is new to PR program, attending 2 sessions.  He was referred to PR via Dr. Sherene Sires with chronic heart failure.  His personal goals is to imporve his  balance and walking ability and decrease SOB with activity.  He tolerates exercise well on treadmill and nu-step with his O2 sats at 94%-95% and no supplemental oxygen needed at this time.  We will encourage him to increase his workload as he progresses in the program.   We will continue to monitor his progress as he works toward meeting these goals.       Expected Outcomes Patient will complete the program meeting both program and personal goals at discharge.                Core Components/Risk Factors/Patient Goals at Discharge (Final Review):   Goals and Risk Factor Review - 03/19/23 1323       Core Components/Risk Factors/Patient Goals Review   Personal Goals Review Develop more efficient breathing techniques such as purse lipped breathing and diaphragmatic breathing and practicing self-pacing with activity.;Improve shortness of breath with ADL's;Heart Failure    Review Patient is new to PR program, attending 2 sessions.  He was referred to PR via Dr. Sherene Sires with chronic heart failure.  His personal goals is to imporve his balance and walking ability and decrease SOB with activity.  He tolerates exercise well on treadmill and nu-step with his O2 sats at 94%-95% and no supplemental oxygen needed at this time.  We will encourage him to increase his workload as he progresses in the program.   We will continue to monitor his progress as he works toward meeting these goals.    Expected Outcomes Patient will complete the program meeting both program and personal goals at discharge.             ITP Comments:   Comments: ITP REVIEW Pt is making expected progress toward pulmonary rehab goals after completing 6 sessions. Recommend continued exercise, life style modification, education, and utilization of breathing techniques to increase  stamina and strength and decrease shortness of breath with exertion.

## 2023-03-28 ENCOUNTER — Encounter (HOSPITAL_COMMUNITY): Payer: HMO

## 2023-04-01 ENCOUNTER — Ambulatory Visit: Payer: HMO

## 2023-04-02 ENCOUNTER — Encounter (HOSPITAL_COMMUNITY)
Admission: RE | Admit: 2023-04-02 | Discharge: 2023-04-02 | Disposition: A | Discharge status: Home / Self Care | Payer: HMO | Source: Ambulatory Visit | Attending: Internal Medicine | Admitting: Internal Medicine

## 2023-04-02 DIAGNOSIS — I509 Heart failure, unspecified: Secondary | ICD-10-CM | POA: Diagnosis not present

## 2023-04-02 NOTE — Progress Notes (Signed)
Daily Session Note  Patient Details  Name: Jeremy Lopez MRN: [id number] Date of Birth: [birth year] Referring Provider:   Flowsheet Row PULMONARY REHAB OTHER RESP ORIENTATION from 03/08/2023 in [facility] CARDIAC REHABILITATION  Referring Provider Dr. Dietrich Pates       Encounter Date: 04/02/2023  Check In:  Session Check In - 04/02/23 1500       Check-In   Supervising physician immediately available to respond to emergencies [facility] MD immediately available    Physician(s) Dr. Jenene Slicker    Location [facility]    Staff Present Ross Ludwig, BS, Exercise Physiologist;Bernis Stecher, RN;Daphyne Daphine Deutscher, RN, BSN    Virtual Visit No    Medication changes reported     No    Fall or balance concerns reported    Yes    Comments Patient reports poor balance and exhibits shuffling gait.    Tobacco Cessation No Change    Warm-up and Cool-down Performed as group-led instruction    Resistance Training Performed Yes    VAD Patient? No    PAD/SET Patient? No      Pain Assessment   Currently in Pain? No/denies    Pain Score 0-No pain    Multiple Pain Sites No             Capillary Blood Glucose: No results found for this or any previous visit (from the past 24 hour(s)).    Social History   Tobacco Use  Smoking Status Former   Types: Cigarettes   Start date: 10/25/1966   Quit date: 10/25/1998   Years since quitting: 24.4  Smokeless Tobacco Never    Goals Met:  Proper associated with RPD/PD & O2 Sat Independence with exercise equipment Using PLB without cueing & demonstrates good technique Exercise tolerated well No report of concerns or symptoms today Strength training completed today  Goals Unmet:  Not Applicable  Comments: check out @ 4:00pm   Dr. Dina Rich is Medical Director for [facility] Cardiac Rehab

## 2023-04-03 ENCOUNTER — Encounter: Payer: Self-pay | Admitting: Professional Counselor

## 2023-04-03 ENCOUNTER — Ambulatory Visit (INDEPENDENT_AMBULATORY_CARE_PROVIDER_SITE_OTHER): Payer: HMO | Admitting: Professional Counselor

## 2023-04-03 DIAGNOSIS — F4323 Adjustment disorder with mixed anxiety and depressed mood: Secondary | ICD-10-CM | POA: Diagnosis not present
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
## 2023-04-04 ENCOUNTER — Encounter: Payer: Self-pay | Admitting: Professional Counselor

## 2023-04-04 ENCOUNTER — Encounter (HOSPITAL_COMMUNITY)
Admission: RE | Admit: 2023-04-04 | Discharge: 2023-04-04 | Disposition: A | Discharge status: Home / Self Care | Payer: HMO | Source: Ambulatory Visit | Attending: Internal Medicine | Admitting: Internal Medicine

## 2023-04-04 DIAGNOSIS — I509 Heart failure, unspecified: Secondary | ICD-10-CM

## 2023-04-04 NOTE — Progress Notes (Addendum)
[facility] Counselor Initial Adult Exam  Name: Jeremy Lopez Date: 6.26.24 MRN: [id number] DOB: 31-[name]-1951 PCP: [name], MD  Time spent: 2:03p - 3:02p   Guardian/Payee:  pt    Paperwork requested:  No   Reason for Visit /Presenting Problem: alcohol use concerns, depression, anxiety  Mental Status Exam:    Appearance:   Neat     Behavior:  Motivated  Motor:  Normal  Speech/Language:   Normal Rate  Affect:  Congruent  Mood:  Depressed  Thought process:  normal  Thought content:    WNL  Sensory/Perceptual disturbances:    WNL  Orientation:  oriented to person, place, time  Attention:  Good  Concentration:  Good  Memory:  WNL  Fund of knowledge:   Good  Insight:    Good  Judgment:   Good  Impulse Control:  Good   Reported Symptoms:  pt reports self-medicating with alcohol and self and family concern for use, anhedonia, low mood, sleep concerns, guilt, restlessness, worries, irritability, interpersonal concerns   Risk Assessment: Danger to Self:  No Self-injurious Behavior: No Danger to Others: No Duty to Warn:no Physical Aggression / Violence:No  Access to Firearms a concern: No  Gang Involvement:No  Patient / guardian was educated about steps to take if suicide or homicide risk level increases between visits: n/a While future psychiatric events cannot be accurately predicted, the patient does not currently require acute inpatient psychiatric care and does not currently meet [facility] involuntary commitment criteria.  Substance Abuse History: Current substance abuse: Yes   Past Psychiatric History:   No previous psychological problems have been observed Outpatient Providers:n/a History of Psych Hospitalization: No  Psychological Testing:  n/a    Abuse History: Victim of No.,  n/a    Report needed: No. Victim of Neglect:No. Perpetrator n/a  Witness / Exposure to Domestic Violence: No   Protective Services Involvement: No  Witness to [employer]  Violence:  No   Family History: Family History  Problem Relation Age of Onset   Cancer Father    CAD Brother    Hypertension Brother     Living situation: the patient lives with their spouse  Sexual Orientation:  Straight  Relationship Status: married  Name of spouse / other: undisclosed by provider to protect privacy of individual             If a parent, number of children / ages: adult son and daughter  Support Systems; spouse Adult children  Financial Stress:  No   Income/Employment/Disability: Neurosurgeon: No   Educational History: Education:  not assessed at this time  Religion/Sprituality/World View:    Christian   Any cultural differences that may affect / interfere with treatment:  n/a  Recreation/Hobbies: pt is a Actor, enjoys reading   Stressors:Marital or family conflict   Substance abuse    Strengths:  Family, Charity fundraiser, Able to [employer], and resilience  Barriers:  n/a   Legal History: Pending legal issue / charges: The patient has no significant history of legal issues. History of legal issue / charges:  n/a  Medical History/Surgical History:reviewed Past Medical History:  Diagnosis Date   CAD (coronary artery disease), native coronary artery    DES proximal LAD 07/2009; DES to RCA 04/2020   Essential hypertension    Habitual alcohol use    Hyperlipidemia    Small cell lung cancer (HCC)    Right upper lobe 2008; no evidence of disease since treatment  ended in 11/2007    Past Surgical History:  Procedure Laterality Date   CORONARY ATHERECTOMY N/A 04/26/2020   Procedure: CORONARY ATHERECTOMY;  Surgeon: Corky Crafts, MD;  Location: [facility] INVASIVE CV LAB;  Service: Cardiovascular;  Laterality: N/A;   CORONARY STENT INTERVENTION N/A 04/26/2020   Procedure: CORONARY STENT INTERVENTION;  Surgeon: Corky Crafts, MD;  Location: [facility] INVASIVE CV LAB;  Service: Cardiovascular;   Laterality: N/A;   CORONARY ULTRASOUND/IVUS N/A 04/26/2020   Procedure: Intravascular Ultrasound/IVUS;  Surgeon: Corky Crafts, MD;  Location: [facility] INVASIVE CV LAB;  Service: Cardiovascular;  Laterality: N/A;   Insertion of left subclavian Port-A-Cath  2008   LEFT HEART CATH AND CORONARY ANGIOGRAPHY N/A 07/03/2022   Procedure: LEFT HEART CATH AND CORONARY ANGIOGRAPHY;  Surgeon: Swaziland, Peter M, MD;  Location: [facility] INVASIVE CV LAB;  Service: Cardiovascular;  Laterality: N/A;   RIGHT/LEFT HEART CATH AND CORONARY ANGIOGRAPHY N/A 04/26/2020   Procedure: RIGHT/LEFT HEART CATH AND CORONARY ANGIOGRAPHY;  Surgeon: Corky Crafts, MD;  Location: [facility] INVASIVE CV LAB;  Service: Cardiovascular;  Laterality: N/A;   TEMPORARY PACEMAKER N/A 04/26/2020   Procedure: TEMPORARY PACEMAKER;  Surgeon: Corky Crafts, MD;  Location: [facility] INVASIVE CV LAB;  Service: Cardiovascular;  Laterality: N/A;   Video bronchoscopy and video mediastinoscopy  2008    Medications: Current Outpatient Medications  Medication Sig Dispense Refill   albuterol (VENTOLIN HFA) 108 (90 Base) MCG/ACT inhaler Inhale 2 puffs into the lungs every 6 (six) hours as needed for wheezing or shortness of breath.     Cholecalciferol (VITAMIN D) 50 MCG (2000 UT) tablet Take 2,000 Units by mouth daily.     clopidogrel (PLAVIX) 75 MG tablet Take 1 tablet (75 mg total) by mouth daily with breakfast. 90 tablet 3   diltiazem (DILACOR XR) 240 MG 24 hr capsule Take 1 capsule (240 mg total) by mouth daily. 90 capsule 3   empagliflozin (JARDIANCE) 10 MG TABS tablet Take 1 tablet (10 mg total) by mouth daily before breakfast. 30 tablet 11   fluticasone (FLONASE) 50 MCG/ACT nasal spray Place 2 sprays into both nostrils once a week. (Patient not taking: Reported on 03/07/2023)     Fluticasone-Umeclidin-Vilant (TRELEGY ELLIPTA) 100-62.5-25 MCG/ACT AEPB Inhale 1 puff into the lungs daily. 180 each 3   gabapentin (NEURONTIN) 300 MG capsule Take 1 capsule (300  mg total) by mouth at bedtime. 90 capsule 3   metoprolol succinate (TOPROL-XL) 100 MG 24 hr tablet TAKE ONE TABLET (100MG  TOTAL) BY MOUTH DAILY. TAKE WITH OR IMMEDIATELY FOLLOWING A MEAL. 90 tablet 2   nitroGLYCERIN (NITROSTAT) 0.4 MG SL tablet Place 1 tablet (0.4 mg total) under the tongue every 5 (five) minutes as needed for chest pain. 25 tablet 2   prochlorperazine (COMPAZINE) 5 MG tablet Take 1 tablet (5 mg total) by mouth every 6 (six) hours as needed for nausea or vomiting. 30 tablet 0   rosuvastatin (CRESTOR) 40 MG tablet TAKE ONE (1) TABLET BY MOUTH EVERY DAY 90 tablet 3   sacubitril-valsartan (ENTRESTO) 49-51 MG Take 1 tablet by mouth 2 (two) times daily. 60 tablet 11   Spacer/Aero-Holding Chambers DEVI Use with inhaler 1 each 2   Current Facility-Administered Medications  Medication Dose Route Frequency Provider Last Rate Last Admin   sodium chloride flush (NS) 0.9 % injection 3 mL  3 mL Intravenous Q12H Dunn, Dayna N, PA-C        Allergies  Allergen Reactions   Cephalexin Anaphylaxis    throat swelling  Patient said he can take amoxicillin   Amlodipine Swelling    Leg swelling    Diagnoses:    ICD-10-CM   1. Adjustment disorder with mixed anxiety and depressed mood  F43.23      Counselor provided person-centered counseling including active listening, resourcing, facilitation of insight; clinical assessment; facilitation of alcohol screenings, GAD, PHQ (see Screening results). Counselor and pt worked to Scientist, forensic. Pt identified his therapeutic goals. Pt presented to session with concerns for alcohol consumption per his own concerns and that of his family. Pt identified self medicating per home life stressors, and processed his experience of circumstances. 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 He voiced time at home as desirable however marital stressors to be of  concern at this time. He identified committed, strong relationship with wife with tensions. Pt and counselor discussed ways challenges impact optimal marital and therefore familial well-being. Pt reported self medicating as a result of stress, worry and sadness around situation. Pt reported attending AA meetings nearly daily. Counselor affirmed pt, and he and counselor discussed his readiness for change, what he wants around his drinking to be different, and harm reduction strategies. Counselor and pt also discussed strategies for relational improvement. Pt gained insight into feelings and overall conceptualization of variables as a whole, and voiced helpfulness of session.   Plan of Care: Client to return for a follow-up in 1-2 weeks upon his scheduling discretion, and as relates option for family session with spouse. Obtain consent for treatment plan, continue to build rapport, assess symptoms and hx, continue process work and developing coping skills.   Bartolo Darter, [facility]

## 2023-04-04 NOTE — Patient Instructions (Signed)
Pt to return for follow-up in 1-2 weeks per his scheduling convenience and family session option Continue to attend recovery meetings  Consider harm reduction strategy as discussed

## 2023-04-04 NOTE — Progress Notes (Signed)
Daily Session Note  Patient Details  Name: Jeremy Lopez MRN: [id number] Date of Birth: 07-Jul-1951 Referring Provider:   Flowsheet Row PULMONARY REHAB OTHER RESP ORIENTATION from 03/08/2023 in [facility] CARDIAC REHABILITATION  Referring Provider Dr. Dietrich Pates       Encounter Date: 04/04/2023  Check In:  Session Check In - 04/04/23 1455       Check-In   Supervising physician immediately available to respond to emergencies [facility] MD immediately available    Physician(s) Dr. Jenene Slicker    Location [facility]    Staff Present Ross Ludwig, BS, Exercise Physiologist;Devina Bezold Daphine Deutscher, RN, BSN    Virtual Visit No    Medication changes reported     No    Fall or balance concerns reported    Yes    Comments Patient reports poor balance and exhibits shuffling gait.    Tobacco Cessation No Change    Warm-up and Cool-down Performed as group-led instruction    Resistance Training Performed Yes      Pain Assessment   Currently in Pain? No/denies    Pain Score 0-No pain             Capillary Blood Glucose: No results found for this or any previous visit (from the past 24 hour(s)).    Social History   Tobacco Use  Smoking Status Former   Types: Cigarettes   Start date: 10/25/1966   Quit date: 10/25/1998   Years since quitting: 24.4  Smokeless Tobacco Never    Goals Met:  Proper associated with RPD/PD & O2 Sat Independence with exercise equipment Using PLB without cueing & demonstrates good technique Exercise tolerated well Queuing for purse lip breathing No report of concerns or symptoms today Strength training completed today  Goals Unmet:  Not Applicable  Comments: Checkout at 1600.   Dr. Erick Blinks is Medical Director for [facility] Pulmonary Rehab.

## 2023-04-09 ENCOUNTER — Encounter (HOSPITAL_COMMUNITY): Payer: HMO

## 2023-04-09 ENCOUNTER — Other Ambulatory Visit: Payer: Self-pay | Admitting: Internal Medicine

## 2023-04-09 ENCOUNTER — Ambulatory Visit: Payer: HMO | Admitting: Internal Medicine

## 2023-04-11 ENCOUNTER — Encounter (HOSPITAL_COMMUNITY): Payer: HMO

## 2023-04-12 ENCOUNTER — Ambulatory Visit: Payer: HMO | Admitting: Behavioral Health

## 2023-04-16 ENCOUNTER — Encounter (HOSPITAL_COMMUNITY)
Admission: RE | Admit: 2023-04-16 | Discharge: 2023-04-16 | Disposition: A | Discharge status: Home / Self Care | Payer: HMO | Source: Ambulatory Visit | Attending: Internal Medicine | Admitting: Internal Medicine

## 2023-04-16 DIAGNOSIS — I509 Heart failure, unspecified: Secondary | ICD-10-CM | POA: Diagnosis not present

## 2023-04-16 NOTE — Progress Notes (Signed)
Daily Session Note  Patient Details  Name: Jeremy Lopez MRN: [id number] Date of Birth: 10/15/1950 Referring Provider:   Flowsheet Row PULMONARY REHAB OTHER RESP ORIENTATION from 03/08/2023 in [facility] CARDIAC REHABILITATION  Referring Provider Dr. Dietrich Pates       Encounter Date: 04/16/2023  Check In:  Session Check In - 04/16/23 1500       Check-In   Supervising physician immediately available to respond to emergencies [facility] MD immediately available    Physician(s) DrEden Emms    Location [facility]    Staff Present Ross Ludwig, BS, Exercise Physiologist;Kariss Longmire, RN;Jessica Hawkins, MA, RCEP, CCRP, CCET    Virtual Visit No    Medication changes reported     No    Fall or balance concerns reported    Yes    Comments Patient reports poor balance and exhibits shuffling gait.    Tobacco Cessation No Change    Warm-up and Cool-down Performed as group-led instruction    Resistance Training Performed Yes    VAD Patient? No    PAD/SET Patient? No      Pain Assessment   Currently in Pain? No/denies    Pain Score 0-No pain    Multiple Pain Sites No             Capillary Blood Glucose: No results found for this or any previous visit (from the past 24 hour(s)).    Social History   Tobacco Use  Smoking Status Former   Types: Cigarettes   Start date: 10/25/1966   Quit date: 10/25/1998   Years since quitting: 24.4  Smokeless Tobacco Never    Goals Met:  Proper associated with RPD/PD & O2 Sat Independence with exercise equipment Using PLB without cueing & demonstrates good technique Exercise tolerated well No report of concerns or symptoms today Strength training completed today  Goals Unmet:  Not Applicable  Comments: Pt able to follow exercise prescription today without complaint.  Will continue to monitor for progression.    Dr. Erick Blinks is Medical Director for [facility] Pulmonary Rehab.

## 2023-04-18 ENCOUNTER — Encounter (HOSPITAL_COMMUNITY)
Admission: RE | Admit: 2023-04-18 | Discharge: 2023-04-18 | Disposition: A | Discharge status: Home / Self Care | Payer: HMO | Source: Ambulatory Visit | Attending: Internal Medicine | Admitting: Internal Medicine

## 2023-04-18 DIAGNOSIS — I509 Heart failure, unspecified: Secondary | ICD-10-CM | POA: Diagnosis not present

## 2023-04-18 NOTE — Progress Notes (Signed)
Daily Session Note  Patient Details  Name: Jeremy Lopez MRN: [id number] Date of Birth: 1951-09-30 Referring Provider:   Flowsheet Row PULMONARY REHAB OTHER RESP ORIENTATION from 03/08/2023 in [facility] CARDIAC REHABILITATION  Referring Provider Dr. Dietrich Pates       Encounter Date: 04/18/2023  Check In:  Session Check In - 04/18/23 1500       Check-In   Supervising physician immediately available to respond to emergencies [facility] MD immediately available    Physician(s) Dr. Diona Browner    Location [facility]    Staff Present Avanell Shackleton BSN, RN;Debra Laural Benes, RN, BSN    Virtual Visit No    Medication changes reported     No    Fall or balance concerns reported    Yes    Comments Patient reports poor balance and exhibits shuffling gait.    Tobacco Cessation No Change    Warm-up and Cool-down Performed on first and last piece of equipment    Resistance Training Performed Yes    VAD Patient? No    PAD/SET Patient? No      Pain Assessment   Currently in Pain? No/denies    Pain Score 0-No pain    Multiple Pain Sites No             Capillary Blood Glucose: No results found for this or any previous visit (from the past 24 hour(s)).    Social History   Tobacco Use  Smoking Status Former   Current packs/day: 0.00   Types: Cigarettes   Start date: 10/25/1966   Quit date: 10/25/1998   Years since quitting: 24.4  Smokeless Tobacco Never    Goals Met:  Proper associated with RPD/PD & O2 Sat Independence with exercise equipment Using PLB without cueing & demonstrates good technique Exercise tolerated well Queuing for purse lip breathing No report of concerns or symptoms today Strength training completed today  Goals Unmet:  Not Applicable  Comments: Marland KitchenMarland KitchenPt able to follow exercise prescription today without complaint.  Will continue to monitor for progression.     Dr. Erick Blinks is Medical Director for [facility] Pulmonary Rehab.

## 2023-04-23 ENCOUNTER — Encounter (HOSPITAL_COMMUNITY): Payer: HMO

## 2023-04-24 ENCOUNTER — Encounter (HOSPITAL_COMMUNITY): Payer: Self-pay | Admitting: *Deleted

## 2023-04-24 DIAGNOSIS — I509 Heart failure, unspecified: Secondary | ICD-10-CM

## 2023-04-24 NOTE — Progress Notes (Signed)
Pulmonary Individual Treatment Plan  Patient Details  Name: Jeremy Lopez MRN: [id number] Date of Birth: 08/16/1951 Referring Provider:   Flowsheet Row PULMONARY REHAB OTHER RESP ORIENTATION from 03/08/2023 in [facility] CARDIAC REHABILITATION  Referring Provider Dr. Dietrich Pates       Initial Encounter Date:  Flowsheet Row PULMONARY REHAB OTHER RESP ORIENTATION from 03/08/2023 in [facility] CARDIAC REHABILITATION  Date 03/08/23       Visit Diagnosis: Chronic congestive heart failure, unspecified heart failure type (HCC)  Patient's Home Medications on Admission:   Current Outpatient Medications:    albuterol (VENTOLIN HFA) 108 (90 Base) MCG/ACT inhaler, Inhale 2 puffs into the lungs every 6 (six) hours as needed for wheezing or shortness of breath., Disp: , Rfl:    Cholecalciferol (VITAMIN D) 50 MCG (2000 UT) tablet, Take 2,000 Units by mouth daily., Disp: , Rfl:    clopidogrel (PLAVIX) 75 MG tablet, Take 1 tablet (75 mg total) by mouth daily with breakfast., Disp: 90 tablet, Rfl: 3   diltiazem (DILACOR XR) 240 MG 24 hr capsule, Take 1 capsule (240 mg total) by mouth daily., Disp: 90 capsule, Rfl: 3   empagliflozin (JARDIANCE) 10 MG TABS tablet, Take 1 tablet (10 mg total) by mouth daily before breakfast., Disp: 30 tablet, Rfl: 11   fluticasone (FLONASE) 50 MCG/ACT nasal spray, Place 2 sprays into both nostrils once a week. (Patient not taking: Reported on 03/07/2023), Disp: , Rfl:    Fluticasone-Umeclidin-Vilant (TRELEGY ELLIPTA) 100-62.5-25 MCG/ACT AEPB, Inhale 1 puff into the lungs daily., Disp: 180 each, Rfl: 3   gabapentin (NEURONTIN) 300 MG capsule, Take 1 capsule (300 mg total) by mouth at bedtime., Disp: 90 capsule, Rfl: 3   metoprolol succinate (TOPROL-XL) 100 MG 24 hr tablet, TAKE ONE TABLET (100MG  TOTAL) BY MOUTH DAILY. TAKE WITH OR IMMEDIATELY FOLLOWING A MEAL., Disp: 90 tablet, Rfl: 2   nitroGLYCERIN (NITROSTAT) 0.4 MG SL tablet, Place 1 tablet (0.4 mg total) under the tongue  every 5 (five) minutes as needed for chest pain., Disp: 25 tablet, Rfl: 2   prochlorperazine (COMPAZINE) 5 MG tablet, Take 1 tablet (5 mg total) by mouth every 6 (six) hours as needed for nausea or vomiting., Disp: 30 tablet, Rfl: 0   rosuvastatin (CRESTOR) 40 MG tablet, TAKE ONE (1) TABLET BY MOUTH EVERY DAY, Disp: 90 tablet, Rfl: 3   sacubitril-valsartan (ENTRESTO) 49-51 MG, Take 1 tablet by mouth 2 (two) times daily., Disp: 60 tablet, Rfl: 11   Spacer/Aero-Holding Chambers DEVI, Use with inhaler, Disp: 1 each, Rfl: 2  Current Facility-Administered Medications:    sodium chloride flush (NS) 0.9 % injection 3 mL, 3 mL, Intravenous, Q12H, Dunn, Tacey Ruiz, PA-C  Past Medical History: Past Medical History:  Diagnosis Date   CAD (coronary artery disease), native coronary artery    DES proximal LAD 07/2009; DES to RCA 04/2020   Essential hypertension    Habitual alcohol use    Hyperlipidemia    Small cell lung cancer (HCC)    Right upper lobe 2008; no evidence of disease since treatment ended in 11/2007    Tobacco Use: Social History   Tobacco Use  Smoking Status Former   Current packs/day: 0.00   Types: Cigarettes   Start date: 10/25/1966   Quit date: 10/25/1998   Years since quitting: 24.5  Smokeless Tobacco Never    Labs: Review Flowsheet  More data exists      Latest Ref Rng & Units 12/28/2019 04/26/2020 01/02/2021 06/13/2022 01/18/2023  Labs for ITP  Cardiac and Pulmonary Rehab  Cholestrol 100 - 199 mg/dL 161  - 096  045  409   LDL (calc) 0 - 99 mg/dL 61  - 49  40  76   HDL-C >39 mg/dL 42  - 49  51  54   Trlycerides 0 - 149 mg/dL 811  - 914  782  956   Hemoglobin A1c 4.8 - 5.6 % - - - - 5.8   PH, Arterial 7.350 - 7.450 - 7.386  - - -  PCO2 arterial 32.0 - 48.0 mmHg - 45.1  - - -  Bicarbonate 20.0 - 28.0 mmol/L - 27.0  25.1  - - -  TCO2 22 - 32 mmol/L - 28  26  - - -  O2 Saturation % - 72.0  94.0  - - -    Details       Multiple values from one day are sorted in  reverse-chronological order         Capillary Blood Glucose: Lab Results  Component Value Date   GLUCAP 101 (H) 03/21/2023   GLUCAP 106 (H) 03/19/2023   GLUCAP 89 03/19/2023   GLUCAP 95 03/14/2023   GLUCAP 104 (H) 03/12/2023     Pulmonary Assessment Scores:  Pulmonary Assessment Scores     Row Name 03/08/23 1345         ADL UCSD   ADL Phase Entry     SOB Score total 15     Rest 0     Walk 1     Stairs 1     Bath 0     Dress 0     Shop 2       CAT Score   CAT Score 14       mMRC Score   mMRC Score 2             UCSD: Self-administered rating of dyspnea associated with activities of daily living (ADLs) 6-point scale (0 = "not at all" to 5 = "maximal or unable to do because of breathlessness")  Scoring Scores range from 0 to 120.  Minimally important difference is 5 units  CAT: CAT can identify the health impairment of COPD patients and is better correlated with disease progression.  CAT has a scoring range of zero to 40. The CAT score is classified into four groups of low (less than 10), medium (10 - 20), high (21-30) and very high (31-40) based on the impact level of disease on health status. A CAT score over 10 suggests significant symptoms.  A worsening CAT score could be explained by an exacerbation, poor medication adherence, poor inhaler technique, or progression of COPD or comorbid conditions.  CAT MCID is 2 points  mMRC: mMRC (Modified Medical Research Council) Dyspnea Scale is used to assess the degree of baseline functional disability in patients of respiratory disease due to dyspnea. No minimal important difference is established. A decrease in score of 1 point or greater is considered a positive change.   Pulmonary Function Assessment:   Exercise Target Goals: Exercise Program Goal: Individual exercise prescription set using results from initial 6 min walk test and THRR while considering  patient's activity barriers and safety.   Exercise  Prescription Goal: Initial exercise prescription builds to 30-45 minutes a day of aerobic activity, 2-3 days per week.  Home exercise guidelines will be given to patient during program as part of exercise prescription that the participant will acknowledge.  Activity Barriers & Risk Stratification:  Activity  Barriers & Cardiac Risk Stratification - 03/08/23 1246       Activity Barriers & Cardiac Risk Stratification   Activity Barriers Shortness of Breath    Cardiac Risk Stratification High             6 Minute Walk:  6 Minute Walk     Row Name 03/08/23 1346         6 Minute Walk   Phase Initial     Distance 1200 feet     Walk Time 6 minutes     # of Rest Breaks 0     MPH 2.27     METS 2.63     RPE 11     Perceived Dyspnea  11     VO2 Peak 9.2     Symptoms No     Resting HR 75 bpm     Resting BP 120/68     Resting Oxygen Saturation  95 %     Exercise Oxygen Saturation  during 6 min walk 93 %     Max Ex. HR 103 bpm     Max Ex. BP 148/76     2 Minute Post BP 124/76       Interval HR   1 Minute HR 95     2 Minute HR 100     3 Minute HR 100     4 Minute HR 101     5 Minute HR 102     6 Minute HR 103     2 Minute Post HR 83     Interval Heart Rate? Yes       Interval Oxygen   Interval Oxygen? Yes     Baseline Oxygen Saturation % 95 %     1 Minute Oxygen Saturation % 94 %     1 Minute Liters of Oxygen 0 L     2 Minute Oxygen Saturation % 93 %     2 Minute Liters of Oxygen 0 L     3 Minute Oxygen Saturation % 94 %     3 Minute Liters of Oxygen 0 L     4 Minute Oxygen Saturation % 94 %     4 Minute Liters of Oxygen 0 L     5 Minute Oxygen Saturation % 94 %     5 Minute Liters of Oxygen 0 L     6 Minute Oxygen Saturation % 94 %     6 Minute Liters of Oxygen 0 L     2 Minute Post Oxygen Saturation % 96 %     2 Minute Post Liters of Oxygen 0 L              Oxygen Initial Assessment:  Oxygen Initial Assessment - 03/08/23 1345       Home Oxygen    Home Oxygen Device None    Sleep Oxygen Prescription None    Home Exercise Oxygen Prescription None    Home Resting Oxygen Prescription None      Initial 6 min Walk   Oxygen Used None      Program Oxygen Prescription   Program Oxygen Prescription None      Intervention   Short Term Goals To learn and understand importance of monitoring SPO2 with pulse oximeter and demonstrate accurate use of the pulse oximeter.;To learn and understand importance of maintaining oxygen saturations>88%;To learn and demonstrate proper pursed lip breathing techniques or other breathing techniques.     Long  Term  Goals Verbalizes importance of monitoring SPO2 with pulse oximeter and return demonstration;Maintenance of O2 saturations>88%;Exhibits proper breathing techniques, such as pursed lip breathing or other method taught during program session;Compliance with respiratory medication             Oxygen Re-Evaluation:  Oxygen Re-Evaluation     Row Name 03/26/23 1300 04/23/23 1525           Program Oxygen Prescription   Program Oxygen Prescription None None        Home Oxygen   Home Oxygen Device None None      Sleep Oxygen Prescription None None      Home Exercise Oxygen Prescription None None      Home Resting Oxygen Prescription None None        Goals/Expected Outcomes   Short Term Goals To learn and understand importance of monitoring SPO2 with pulse oximeter and demonstrate accurate use of the pulse oximeter.;To learn and understand importance of maintaining oxygen saturations>88%;To learn and demonstrate proper pursed lip breathing techniques or other breathing techniques.  To learn and understand importance of monitoring SPO2 with pulse oximeter and demonstrate accurate use of the pulse oximeter.;To learn and understand importance of maintaining oxygen saturations>88%;To learn and demonstrate proper pursed lip breathing techniques or other breathing techniques.       Long  Term Goals  Verbalizes importance of monitoring SPO2 with pulse oximeter and return demonstration;Maintenance of O2 saturations>88%;Exhibits proper breathing techniques, such as pursed lip breathing or other method taught during program session;Compliance with respiratory medication Verbalizes importance of monitoring SPO2 with pulse oximeter and return demonstration;Maintenance of O2 saturations>88%;Exhibits proper breathing techniques, such as pursed lip breathing or other method taught during program session;Compliance with respiratory medication               Oxygen Discharge (Final Oxygen Re-Evaluation):  Oxygen Re-Evaluation - 04/23/23 1525       Program Oxygen Prescription   Program Oxygen Prescription None      Home Oxygen   Home Oxygen Device None    Sleep Oxygen Prescription None    Home Exercise Oxygen Prescription None    Home Resting Oxygen Prescription None      Goals/Expected Outcomes   Short Term Goals To learn and understand importance of monitoring SPO2 with pulse oximeter and demonstrate accurate use of the pulse oximeter.;To learn and understand importance of maintaining oxygen saturations>88%;To learn and demonstrate proper pursed lip breathing techniques or other breathing techniques.     Long  Term Goals Verbalizes importance of monitoring SPO2 with pulse oximeter and return demonstration;Maintenance of O2 saturations>88%;Exhibits proper breathing techniques, such as pursed lip breathing or other method taught during program session;Compliance with respiratory medication             Initial Exercise Prescription:  Initial Exercise Prescription - 03/08/23 1400       Date of Initial Exercise RX and Referring Provider   Date 03/08/23    Referring Provider Dr. Dietrich Pates    Expected Discharge Date 07/11/23      Treadmill   MPH 1.6    Grade 0    Minutes 17      NuStep   Level 1    SPM 80    Minutes 22      Prescription Details   Frequency (times per week) 2     Duration Progress to 30 minutes of continuous aerobic without signs/symptoms of physical distress      Intensity   THRR 40-80% of  Max Heartrate 60-119    Ratings of Perceived Exertion 11-13    Perceived Dyspnea 0-4      Resistance Training   Training Prescription Yes    Weight 5    Reps 10-15             Perform Capillary Blood Glucose checks as needed.  Exercise Prescription Changes:   Exercise Prescription Changes     Row Name 03/12/23 1500 03/26/23 1500 04/04/23 1500         Response to Exercise   Blood Pressure (Admit) 130/72 112/78 126/60     Blood Pressure (Exercise) 170/78 140/70 124/68     Blood Pressure (Exit) 130/64 120/60 126/50     Heart Rate (Admit) 88 bpm 80 bpm 66 bpm     Heart Rate (Exercise) 100 bpm 102 bpm 93 bpm     Heart Rate (Exit) 84 bpm 86 bpm 68 bpm     Oxygen Saturation (Admit) 94 % 96 % 94 %     Oxygen Saturation (Exercise) 95 % 95 % 95 %     Oxygen Saturation (Exit) 95 % 95 % 94 %     Rating of Perceived Exertion (Exercise) 11 9 9      Perceived Dyspnea (Exercise) 12 9 9      Duration Continue with 30 min of aerobic exercise without signs/symptoms of physical distress. Continue with 30 min of aerobic exercise without signs/symptoms of physical distress. Continue with 30 min of aerobic exercise without signs/symptoms of physical distress.     Intensity THRR unchanged THRR unchanged THRR unchanged       Progression   Progression Continue to progress workloads to maintain intensity without signs/symptoms of physical distress. Continue to progress workloads to maintain intensity without signs/symptoms of physical distress. Continue to progress workloads to maintain intensity without signs/symptoms of physical distress.       Resistance Training   Training Prescription Yes Yes Yes     Weight 3 5 5      Reps 10-15 10-15 10-15     Time 10 Minutes 10 Minutes 10 Minutes       Treadmill   MPH 1.3 1.5 1.5     Grade 0 0 0     Minutes 17 17 17       METs 2 2.15 2.15       NuStep   Level 1 2 2      SPM 78 85 76     Minutes 22 22 22      METs 1.6 1.6 1.6              Exercise Comments:   Exercise Goals and Review:   Exercise Goals     Row Name 03/08/23 1403 03/26/23 1248 04/23/23 1519         Exercise Goals   Increase Physical Activity Yes Yes Yes     Intervention Provide advice, education, support and counseling about physical activity/exercise needs.;Develop an individualized exercise prescription for aerobic and resistive training based on initial evaluation findings, risk stratification, comorbidities and participant's personal goals. Provide advice, education, support and counseling about physical activity/exercise needs.;Develop an individualized exercise prescription for aerobic and resistive training based on initial evaluation findings, risk stratification, comorbidities and participant's personal goals. Provide advice, education, support and counseling about physical activity/exercise needs.;Develop an individualized exercise prescription for aerobic and resistive training based on initial evaluation findings, risk stratification, comorbidities and participant's personal goals.     Expected Outcomes Short Term: Attend rehab on a regular basis  to increase amount of physical activity.;Long Term: Add in home exercise to make exercise part of routine and to increase amount of physical activity.;Long Term: Exercising regularly at least 3-5 days a week. Short Term: Attend rehab on a regular basis to increase amount of physical activity.;Long Term: Add in home exercise to make exercise part of routine and to increase amount of physical activity.;Long Term: Exercising regularly at least 3-5 days a week. Short Term: Attend rehab on a regular basis to increase amount of physical activity.;Long Term: Add in home exercise to make exercise part of routine and to increase amount of physical activity.;Long Term: Exercising regularly at least  3-5 days a week.     Increase Strength and Stamina Yes Yes Yes     Intervention Provide advice, education, support and counseling about physical activity/exercise needs.;Develop an individualized exercise prescription for aerobic and resistive training based on initial evaluation findings, risk stratification, comorbidities and participant's personal goals. Provide advice, education, support and counseling about physical activity/exercise needs.;Develop an individualized exercise prescription for aerobic and resistive training based on initial evaluation findings, risk stratification, comorbidities and participant's personal goals. Provide advice, education, support and counseling about physical activity/exercise needs.;Develop an individualized exercise prescription for aerobic and resistive training based on initial evaluation findings, risk stratification, comorbidities and participant's personal goals.     Expected Outcomes Short Term: Increase workloads from initial exercise prescription for resistance, speed, and METs.;Short Term: Perform resistance training exercises routinely during rehab and add in resistance training at home;Long Term: Improve cardiorespiratory fitness, muscular endurance and strength as measured by increased METs and functional capacity ( ) Short Term: Increase workloads from initial exercise prescription for resistance, speed, and METs.;Short Term: Perform resistance training exercises routinely during rehab and add in resistance training at home;Long Term: Improve cardiorespiratory fitness, muscular endurance and strength as measured by increased METs and functional capacity ( ) Short Term: Increase workloads from initial exercise prescription for resistance, speed, and METs.;Short Term: Perform resistance training exercises routinely during rehab and add in resistance training at home;Long Term: Improve cardiorespiratory fitness, muscular endurance and strength as measured by  increased METs and functional capacity ( )     Able to understand and use rate of perceived exertion (RPE) scale Yes Yes Yes     Intervention Provide education and explanation on how to use RPE scale Provide education and explanation on how to use RPE scale Provide education and explanation on how to use RPE scale     Expected Outcomes Short Term: Able to use RPE daily in rehab to express subjective intensity level;Long Term:  Able to use RPE to guide intensity level when exercising independently Short Term: Able to use RPE daily in rehab to express subjective intensity level;Long Term:  Able to use RPE to guide intensity level when exercising independently Short Term: Able to use RPE daily in rehab to express subjective intensity level;Long Term:  Able to use RPE to guide intensity level when exercising independently     Able to understand and use Dyspnea scale Yes Yes Yes     Intervention Provide education and explanation on how to use Dyspnea scale Provide education and explanation on how to use Dyspnea scale Provide education and explanation on how to use Dyspnea scale     Expected Outcomes Short Term: Able to use Dyspnea scale daily in rehab to express subjective sense of shortness of breath during exertion;Long Term: Able to use Dyspnea scale to guide intensity level when exercising independently Short Term: Able to use  Dyspnea scale daily in rehab to express subjective sense of shortness of breath during exertion;Long Term: Able to use Dyspnea scale to guide intensity level when exercising independently Short Term: Able to use Dyspnea scale daily in rehab to express subjective sense of shortness of breath during exertion;Long Term: Able to use Dyspnea scale to guide intensity level when exercising independently     Knowledge and understanding of Target Heart Rate Range (THRR) Yes Yes Yes     Intervention Provide education and explanation of THRR including how the numbers were predicted and where  they are located for reference Provide education and explanation of THRR including how the numbers were predicted and where they are located for reference Provide education and explanation of THRR including how the numbers were predicted and where they are located for reference     Expected Outcomes Short Term: Able to state/look up THRR;Short Term: Able to use daily as guideline for intensity in rehab;Long Term: Able to use THRR to govern intensity when exercising independently Short Term: Able to state/look up THRR;Short Term: Able to use daily as guideline for intensity in rehab;Long Term: Able to use THRR to govern intensity when exercising independently Short Term: Able to state/look up THRR;Short Term: Able to use daily as guideline for intensity in rehab;Long Term: Able to use THRR to govern intensity when exercising independently     Able to check pulse independently -- Yes --     Understanding of Exercise Prescription Yes Yes Yes     Intervention Provide education, explanation, and written materials on patient's individual exercise prescription Provide education, explanation, and written materials on patient's individual exercise prescription Provide education, explanation, and written materials on patient's individual exercise prescription     Expected Outcomes Short Term: Able to explain program exercise prescription;Long Term: Able to explain home exercise prescription to exercise independently Short Term: Able to explain program exercise prescription;Long Term: Able to explain home exercise prescription to exercise independently Short Term: Able to explain program exercise prescription;Long Term: Able to explain home exercise prescription to exercise independently              Exercise Goals Re-Evaluation :  Exercise Goals Re-Evaluation     Row Name 03/26/23 1249 04/23/23 1519           Exercise Goal Re-Evaluation   Exercise Goals Review Increase Physical Activity;Increase Strength  and Stamina;Able to understand and use rate of perceived exertion (RPE) scale;Able to understand and use Dyspnea scale;Knowledge and understanding of Target Heart Rate Range (THRR);Understanding of Exercise Prescription Increase Physical Activity;Increase Strength and Stamina;Able to understand and use rate of perceived exertion (RPE) scale;Able to understand and use Dyspnea scale;Knowledge and understanding of Target Heart Rate Range (THRR);Understanding of Exercise Prescription      Comments Pt has completed 4 sessions of pulmoanry rehab. He is tolerating exercise and progressing his workloads. He is currently exercising at 2.15 METs on the treadmill. Will continue to monitor and progress as able. Pt has completed 9 sessions of pulmonary rehab. He continues to be motivated and in progressing in his workloads. He is currently exercising at 2.15 METs on the treadmill. Will continue to monitor and progress as able .      Expected Outcomes Through exercising at rehab and a home exercise plan, patient will achieve his goals. Through exercising at rehab and a home exercise plan, patient will achieve his goals.               Discharge Exercise Prescription (  Final Exercise Prescription Changes):  Exercise Prescription Changes - 04/04/23 1500       Response to Exercise   Blood Pressure (Admit) 126/60    Blood Pressure (Exercise) 124/68    Blood Pressure (Exit) 126/50    Heart Rate (Admit) 66 bpm    Heart Rate (Exercise) 93 bpm    Heart Rate (Exit) 68 bpm    Oxygen Saturation (Admit) 94 %    Oxygen Saturation (Exercise) 95 %    Oxygen Saturation (Exit) 94 %    Rating of Perceived Exertion (Exercise) 9    Perceived Dyspnea (Exercise) 9    Duration Continue with 30 min of aerobic exercise without signs/symptoms of physical distress.    Intensity THRR unchanged      Progression   Progression Continue to progress workloads to maintain intensity without signs/symptoms of physical distress.       Resistance Training   Training Prescription Yes    Weight 5    Reps 10-15    Time 10 Minutes      Treadmill   MPH 1.5    Grade 0    Minutes 17    METs 2.15      NuStep   Level 2    SPM 76    Minutes 22    METs 1.6             Nutrition:  Target Goals: Understanding of nutrition guidelines, daily intake of sodium 1500mg , cholesterol 200mg , calories 30% from fat and 7% or less from saturated fats, daily to have 5 or more servings of fruits and vegetables.  Biometrics:  Pre Biometrics - 03/08/23 1404       Pre Biometrics   Height 5\' 10"  (1.778 m)    Weight 219 lb 5.7 oz (99.5 kg)    Waist Circumference 46.5 inches    Hip Circumference 44.5 inches    Waist to Hip Ratio 1.04 %    BMI (Calculated) 31.47    Triceps Skinfold 15 mm    % Body Fat 31.5 %    Grip Strength 39.8 kg    Flexibility 0 in    Single Leg Stand 8 seconds              Nutrition Therapy Plan and Nutrition Goals:  Nutrition Therapy & Goals - 04/15/23 1427       Nutrition Therapy   RD appointment deferred Yes      Personal Nutrition Goals   Comments We provide educational sessions on heart healthy nutrition with handouts.      Intervention Plan   Intervention Nutrition handout(s) given to patient.    Expected Outcomes Short Term Goal: Understand basic principles of dietary content, such as calories, fat, sodium, cholesterol and nutrients.             Nutrition Assessments:  Nutrition Assessments - 03/08/23 1323       MEDFICTS Scores   Pre Score 15            MEDIFICTS Score Key: ?70 Need to make dietary changes  40-70 Heart Healthy Diet ? 40 Therapeutic Level Cholesterol Diet   Picture Your Plate Scores: <78 Unhealthy dietary pattern with much room for improvement. 41-50 Dietary pattern unlikely to meet recommendations for good health and room for improvement. 51-60 More healthful dietary pattern, with some room for improvement.  >60 Healthy dietary pattern,  although there may be some specific behaviors that could be improved.    Nutrition Goals Re-Evaluation:  Nutrition Goals Discharge (Final Nutrition Goals Re-Evaluation):   Psychosocial: Target Goals: Acknowledge presence or absence of significant depression and/or stress, maximize coping skills, provide positive support system. Participant is able to verbalize types and ability to use techniques and skills needed for reducing stress and depression.  Initial Review & Psychosocial Screening:  Initial Psych Review & Screening - 03/08/23 1327       Initial Review   Current issues with None Identified      Family Dynamics   Good Support System? Yes      Barriers   Psychosocial barriers to participate in program There are no identifiable barriers or psychosocial needs.;The patient should benefit from training in stress management and relaxation.      Screening Interventions   Interventions Encouraged to exercise;To provide support and resources with identified psychosocial needs    Expected Outcomes Short Term goal: Identification and review with participant of any Quality of Life or Depression concerns found by scoring the questionnaire.;Short Term goal: Utilizing psychosocial counselor, staff and physician to assist with identification of specific Stressors or current issues interfering with healing process. Setting desired goal for each stressor or current issue identified.             Quality of Life Scores:  Quality of Life - 03/08/23 1345       Quality of Life   Select Quality of Life      Quality of Life Scores   Health/Function Pre 21.38 %    Socioeconomic Pre 29 %    Psych/Spiritual Pre 30 %    Family Pre 27.6 %    GLOBAL Pre 25.41 %            Scores of 19 and below usually indicate a poorer quality of life in these areas.  A difference of  2-3 points is a clinically meaningful difference.  A difference of 2-3 points in the total score of the Quality of Life  Index has been associated with significant improvement in overall quality of life, self-image, physical symptoms, and general health in studies assessing change in quality of life.   PHQ-9: Review Flowsheet  More data may exist      04/03/2023 03/08/2023 01/18/2023 10/21/2020 07/14/2020  Depression screen PHQ 2/9  Decreased Interest  0 0 0 0  Down, Depressed, Hopeless  0 0 0 0  PHQ - 2 Score  0 0 0 0  Altered sleeping  1 3 0 0  Tired, decreased energy  1 3 0 1  Change in appetite  0 0 0 0  Feeling bad or failure about yourself   0 0 0 0  Trouble concentrating  0 0 0 0  Moving slowly or fidgety/restless  0 0 0 0  Suicidal thoughts - 0 0 0 0  PHQ-9 Score  2 6 0 1  Difficult doing work/chores - Not difficult at all - Not difficult at all Not difficult at all    Details       Information is confidential and restricted. Go to Review Flowsheets to unlock data.        Interpretation of Total Score  Total Score Depression Severity:  1-4 = Minimal depression, 5-9 = Mild depression, 10-14 = Moderate depression, 15-19 = Moderately severe depression, 20-27 = Severe depression   Psychosocial Evaluation and Intervention:  Psychosocial Evaluation - 03/08/23 1352       Psychosocial Evaluation & Interventions   Interventions Stress management education;Relaxation education;Encouraged to exercise with the program and  follow exercise prescription    Comments Patient has no psychosocial barriers or issues identified at his orientation visit. His PHQ-9 score was 2 due to lack of energy and trouble staying alseep at times. He denies any depression or anxiety. He has participated in cardiac rehab with [place] and is very familiar with our staff and the program. He is a retired Occupational hygienist. He has a great support system with his wife, 2 children and several grandchildren and he has a brother and several friends that also support him. He is very motivated to get back into an exercise routine and is ready to start the  program.    Expected Outcomes Patient will have no psychosocial issues identified.    Continue Psychosocial Services  No Follow up required             Psychosocial Re-Evaluation:  Psychosocial Re-Evaluation     Row Name 03/19/23 1313 04/15/23 1427           Psychosocial Re-Evaluation   Current issues with None Identified None Identified      Comments Patient is new to PR program attending 2 session.  He was referred to PR by Dr. Tenny Craw with Chronic heart failure.  Patient initial QOL score is 25.41% and his PHQ-9 score is 2.  He is very pleasant and interactive with class and staff.  We will continue to monitor his progress as he works toward meeting these goals. Patient has completed 7 sessions.  He continues to have no psychosocial barriers identified. He sees a Veterinary surgeon routinely for adjustment disorder and depression.  He seems to enjoy the sessions. We will continue to monitor his progress.      Expected Outcomes Patient will have no psychological issues identified at discharge Patient will have no psychological issues identified.      Interventions Stress management education;Relaxation education;Encouraged to attend Pulmonary Rehabilitation for the exercise Stress management education;Relaxation education;Encouraged to attend Pulmonary Rehabilitation for the exercise      Continue Psychosocial Services  No Follow up required No Follow up required               Psychosocial Discharge (Final Psychosocial Re-Evaluation):  Psychosocial Re-Evaluation - 04/15/23 1427       Psychosocial Re-Evaluation   Current issues with None Identified    Comments Patient has completed 7 sessions.  He continues to have no psychosocial barriers identified. He sees a Veterinary surgeon routinely for adjustment disorder and depression.  He seems to enjoy the sessions. We will continue to monitor his progress.    Expected Outcomes Patient will have no psychological issues identified.    Interventions  Stress management education;Relaxation education;Encouraged to attend Pulmonary Rehabilitation for the exercise    Continue Psychosocial Services  No Follow up required              Education: Education Goals: Education classes will be provided on a weekly basis, covering required topics. Participant will state understanding/return demonstration of topics presented.  Learning Barriers/Preferences:  Learning Barriers/Preferences - 03/08/23 1324       Learning Barriers/Preferences   Learning Barriers None    Learning Preferences Written Material             Education Topics: How Lungs Work and Diseases: - Discuss the anatomy of the lungs and diseases that can affect the lungs, such as COPD.   Exercise: -Discuss the importance of exercise, FITT principles of exercise, normal and abnormal responses to exercise, and how to exercise safely.  Environmental Irritants: -Discuss types of environmental irritants and how to limit exposure to environmental irritants. Flowsheet Row PULMONARY REHAB OTHER RESPIRATORY from 04/18/2023 in [place] [facility]  Date 04/18/23  Educator handout       Meds/Inhalers and oxygen: - Discuss respiratory medications, definition of an inhaler and oxygen, and the proper way to use an inhaler and oxygen.   Energy Saving Techniques: - Discuss methods to conserve energy and decrease shortness of breath when performing activities of daily living.    Bronchial Hygiene / Breathing Techniques: - Discuss breathing mechanics, pursed-lip breathing technique,  proper posture, effective ways to clear airways, and other functional breathing techniques   Cleaning Equipment: - Provides group verbal and written instruction about the health risks of elevated stress, cause of high stress, and healthy ways to reduce stress.   Nutrition I: Fats: - Discuss the types of cholesterol, what cholesterol does to the body, and how cholesterol levels  can be controlled. Flowsheet Row CARDIAC REHAB PHASE II EXERCISE from 10/05/2020 in [place] [facility]  Date 08/31/20  Educator [facility]  Instruction Review Code 2- Demonstrated Understanding       Nutrition II: Labels: -Discuss the different components of food labels and how to read food labels. Flowsheet Row CARDIAC REHAB PHASE II EXERCISE from 10/05/2020 in [place] [facility]  Date 09/07/20  Educator DF  Instruction Review Code 2- Demonstrated Understanding       Respiratory Infections: - Discuss the signs and symptoms of respiratory infections, ways to prevent respiratory infections, and the importance of seeking medical treatment when having a respiratory infection. Flowsheet Row PULMONARY REHAB OTHER RESPIRATORY from 04/18/2023 in [place] [facility]  Date 03/14/23  Educator handout       Stress I: Signs and Symptoms: - Discuss the causes of stress, how stress may lead to anxiety and depression, and ways to limit stress. Flowsheet Row PULMONARY REHAB OTHER RESPIRATORY from 04/18/2023 in [place] [facility]  Date 03/21/23  Educator handout       Stress II: Relaxation: -Discuss relaxation techniques to limit stress.   Oxygen for Home/Travel: - Discuss how to prepare for travel when on oxygen and proper ways to transport and store oxygen to ensure safety. Flowsheet Row PULMONARY REHAB OTHER RESPIRATORY from 04/18/2023 in [employer] [facility]  Date 04/04/23  Educator handout       Knowledge Questionnaire Score:  Knowledge Questionnaire Score - 03/08/23 1324       Knowledge Questionnaire Score   Pre Score 14/18             Core Components/Risk Factors/Patient Goals at Admission:  Personal Goals and Risk Factors at Admission - 03/08/23 1325       Core Components/Risk Factors/Patient Goals on Admission    Weight Management Obesity    Improve shortness of breath with ADL's Yes     Intervention Provide education, individualized exercise plan and daily activity instruction to help decrease symptoms of SOB with activities of daily living.    Expected Outcomes Short Term: Improve cardiorespiratory fitness to achieve a reduction of symptoms when performing ADLs;Long Term: Be able to perform more ADLs without symptoms or delay the onset of symptoms    Heart Failure Yes    Intervention Provide a combined exercise and nutrition program that is supplemented with education, support and counseling about heart failure. Directed toward relieving symptoms such as shortness of breath, decreased exercise tolerance, and extremity edema.    Expected Outcomes Short term:  Attendance in program 2-3 days a week with increased exercise capacity. Reported lower sodium intake. Reported increased fruit and vegetable intake. Reports medication compliance.;Long term: Adoption of self-care skills and reduction of barriers for early signs and symptoms recognition and intervention leading to self-care maintenance.    Hypertension Yes    Intervention Provide education on lifestyle modifcations including regular physical activity/exercise, weight management, moderate sodium restriction and increased consumption of fresh fruit, vegetables, and low fat dairy, alcohol moderation, and smoking cessation.;Monitor prescription use compliance.    Expected Outcomes Short Term: Continued assessment and intervention until BP is < 140/14mm HG in hypertensive participants. < 130/72mm HG in hypertensive participants with diabetes, heart failure or chronic kidney disease.;Long Term: Maintenance of blood pressure at goal levels.    Personal Goal Other Yes    Personal Goal Patient wants to improve his balance and wallking ability and decrease his SOB with activity.    Intervention Patient will attend PR 3 days/week with exercise and education.    Expected Outcomes Patient will complete the program meeting both personal and program  goals.             Core Components/Risk Factors/Patient Goals Review:   Goals and Risk Factor Review     Row Name 03/19/23 1323 04/15/23 1429           Core Components/Risk Factors/Patient Goals Review   Personal Goals Review Develop more efficient breathing techniques such as purse lipped breathing and diaphragmatic breathing and practicing self-pacing with activity.;Improve shortness of breath with ADL's;Heart Failure Develop more efficient breathing techniques such as purse lipped breathing and diaphragmatic breathing and practicing self-pacing with activity.;Improve shortness of breath with ADL's;Heart Failure      Review Patient is new to PR program, attending 2 sessions.  He was referred to PR via Dr. Sherene Sires with chronic heart failure.  His personal goals is to imporve his balance and walking ability and decrease SOB with activity.  He tolerates exercise well on treadmill and nu-step with his O2 sats at 94%-95% and no supplemental oxygen needed at this time.  We will encourage him to increase his workload as he progresses in the program.   We will continue to monitor his progress as he works toward meeting these goals. Patient has completed 7 sessions. He is doing well in the program with consistent attendance. He is exercising on RA with his O2 sats averaging 96%.  His personal goals is to imporve his balance and walking ability and decrease SOB with activity. We will continue to monitor his progress as he works toward meeting these goals.      Expected Outcomes Patient will complete the program meeting both program and personal goals at discharge. Patient will complete the program meeting both personal and program goals.               Core Components/Risk Factors/Patient Goals at Discharge (Final Review):   Goals and Risk Factor Review - 04/15/23 1429       Core Components/Risk Factors/Patient Goals Review   Personal Goals Review Develop more efficient breathing techniques such  as purse lipped breathing and diaphragmatic breathing and practicing self-pacing with activity.;Improve shortness of breath with ADL's;Heart Failure    Review Patient has completed 7 sessions. He is doing well in the program with consistent attendance. He is exercising on RA with his O2 sats averaging 96%.  His personal goals is to imporve his balance and walking ability and decrease SOB with activity. We will continue to  monitor his progress as he works toward meeting these goals.    Expected Outcomes Patient will complete the program meeting both personal and program goals.             ITP Comments:  ITP Comments     Row Name 04/24/23 1614           ITP Comments 30 day review completed. ITP sent to Dr.Jehanzeb Memon, Medical Director of  Pulmonary Rehab. Continue with ITP unless changes are made by physician.                Comments: 30 day review

## 2023-04-25 ENCOUNTER — Encounter (HOSPITAL_COMMUNITY): Payer: HMO

## 2023-04-25 DIAGNOSIS — I509 Heart failure, unspecified: Secondary | ICD-10-CM | POA: Diagnosis not present

## 2023-04-25 NOTE — Progress Notes (Signed)
Daily Session Note  Patient Details  Name: Jeremy Lopez MRN: [id number] Date of Birth: 04/07/1951 Referring Provider:   Flowsheet Row PULMONARY REHAB OTHER RESP ORIENTATION from 03/08/2023 in [facility]  Referring Provider Dr. Dietrich Pates       Encounter Date: 04/25/2023  Check In:  Session Check In - 04/25/23 1458       Check-In   Supervising physician immediately available to respond to emergencies [facility] MD immediately available    Physician(s) Dr Jenene Slicker    Location [facility]    Staff Present Ross Ludwig, BS, Exercise Physiologist;Johnathan Heskett Daphine Deutscher, RN, BSN    Virtual Visit No    Medication changes reported     No    Fall or balance concerns reported    Yes    Comments Patient reports poor balance and exhibits shuffling gait.    Tobacco Cessation No Change    Warm-up and Cool-down Performed on first and last piece of equipment    Resistance Training Performed Yes    VAD Patient? No      Pain Assessment   Currently in Pain? No/denies    Pain Score 0-No pain             Capillary Blood Glucose: No results found for this or any previous visit (from the past 24 hour(s)).    Social History   Tobacco Use  Smoking Status Former   Current packs/day: 0.00   Types: Cigarettes   Start date: 10/25/1966   Quit date: 10/25/1998   Years since quitting: 24.5  Smokeless Tobacco Never    Goals Met:  Proper associated with RPD/PD & O2 Sat Independence with exercise equipment Using PLB without cueing & demonstrates good technique Exercise tolerated well Queuing for purse lip breathing No report of concerns or symptoms today Strength training completed today  Goals Unmet:  Not Applicable  Comments: Pt able to follow exercise prescription today without complaint.  Will continue to monitor for progression.     Dr. Erick Blinks is Medical Director for [facility] Pulmonary Rehab.

## 2023-04-30 ENCOUNTER — Encounter (HOSPITAL_COMMUNITY): Admission: RE | Admit: 2023-04-30 | Payer: HMO | Source: Ambulatory Visit

## 2023-04-30 ENCOUNTER — Ambulatory Visit: Payer: HMO | Admitting: Diagnostic Neuroimaging

## 2023-04-30 ENCOUNTER — Ambulatory Visit (INDEPENDENT_AMBULATORY_CARE_PROVIDER_SITE_OTHER): Payer: HMO | Admitting: Behavioral Health

## 2023-04-30 ENCOUNTER — Encounter: Payer: Self-pay | Admitting: Behavioral Health

## 2023-04-30 VITALS — BP 142/93 | HR 84 | Ht 70.0 in | Wt 219.0 lb

## 2023-04-30 DIAGNOSIS — I509 Heart failure, unspecified: Secondary | ICD-10-CM | POA: Diagnosis not present

## 2023-04-30 DIAGNOSIS — F331 Major depressive disorder, recurrent, moderate: Secondary | ICD-10-CM

## 2023-04-30 DIAGNOSIS — F4323 Adjustment disorder with mixed anxiety and depressed mood: Secondary | ICD-10-CM | POA: Diagnosis not present

## 2023-04-30 MED ORDER — BUPROPION HCL ER (XL) 150 MG PO TB24
150.0000 mg | ORAL_TABLET | Freq: Every day | ORAL | 1 refills | Status: DC
Start: 2023-04-30 — End: 2023-06-21

## 2023-04-30 MED ORDER — TRAZODONE HCL 50 MG PO TABS
50.0000 mg | ORAL_TABLET | Freq: Every day | ORAL | 1 refills | Status: DC
Start: 2023-04-30 — End: 2023-06-21

## 2023-04-30 NOTE — Progress Notes (Deleted)
[facility] Check  Patient ID: BRACH BIRDSALL,  MRN: [id number]  PCP: [name], MD  Date of Evaluation: 04/30/2023 Time spent:60 minutes  Chief Complaint:  Chief Complaint   Depression; Medication Refill; Establish Care; Patient Education     HISTORY/CURRENT STATUS: HPI  "Jeremy Lopez", 72 year old male presents to this office for initial visit and to establish care.  Collateral information should be considered reliable says that his family has been talking to him about seeking care for depression.  Patient says "heck I do not even know if I can define depression or what it is, but I guess did not have not really wanted to do a whole lot or do the things that I used to do".  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  Says that he would like to assess today whether he has depression levels enough to warrant taking medication.  Says that he also would like to discuss his sleep.  Says that he gets about 6 hours total per night but does not like waking up frequently.  Says that he is open to medication to help with sleep quality.  No past psychiatric medication trials  Individual Medical History/ Review of Systems: Changes? :No   Allergies: Cephalexin and Amlodipine  Current Medications:  Current Outpatient Medications:  .  albuterol (VENTOLIN HFA) 108 (90 Base) MCG/ACT inhaler, Inhale 2 puffs into the lungs every 6 (six) hours as needed for wheezing or shortness of breath., Disp: , Rfl:  .  buPROPion (WELLBUTRIN XL) 150 MG 24 hr tablet, Take 1 tablet (150 mg total) by mouth daily., Disp: 30 tablet, Rfl: 1 .  Cholecalciferol (VITAMIN D) 50 MCG (2000 UT) tablet, Take 2,000 Units by mouth daily., Disp: , Rfl:  .  clopidogrel (PLAVIX) 75 MG tablet, Take 1 tablet (75 mg total) by mouth daily with breakfast., Disp: 90 tablet, Rfl: 3 .   diltiazem (DILACOR XR) 240 MG 24 hr capsule, Take 1 capsule (240 mg total) by mouth daily., Disp: 90 capsule, Rfl: 3 .  empagliflozin (JARDIANCE) 10 MG TABS tablet, Take 1 tablet (10 mg total) by mouth daily before breakfast., Disp: 30 tablet, Rfl: 11 .  Fluticasone-Umeclidin-Vilant (TRELEGY ELLIPTA) 100-62.5-25 MCG/ACT AEPB, Inhale 1 puff into the lungs daily., Disp: 180 each, Rfl: 3 .  gabapentin (NEURONTIN) 300 MG capsule, Take 1 capsule (300 mg total) by mouth at bedtime., Disp: 90 capsule, Rfl: 3 .  metoprolol succinate (TOPROL-XL) 100 MG 24 hr tablet, TAKE ONE TABLET (100MG  TOTAL) BY MOUTH DAILY. TAKE WITH OR IMMEDIATELY FOLLOWING A MEAL., Disp: 90 tablet, Rfl: 2 .  nitroGLYCERIN (NITROSTAT) 0.4 MG SL tablet, Place 1 tablet (0.4 mg total) under the tongue every 5 (five) minutes as needed for chest pain., Disp: 25 tablet, Rfl: 2 .  rosuvastatin (CRESTOR) 40 MG tablet, TAKE ONE (1) TABLET BY MOUTH EVERY DAY, Disp: 90 tablet, Rfl: 3 .  sacubitril-valsartan (ENTRESTO) 49-51 MG, Take 1 tablet by mouth 2 (two) times daily., Disp: 60 tablet, Rfl: 11 .  Spacer/Aero-Holding Chambers DEVI, Use with inhaler, Disp: 1 each, Rfl: 2 .  traZODone (DESYREL) 50 MG tablet, Take 1 tablet (50 mg total) by mouth at bedtime., Disp: 30 tablet, Rfl: 1 .  fluticasone (FLONASE) 50 MCG/ACT nasal spray, Place 2 sprays into both nostrils once a week. (Patient not taking: Reported on 03/07/2023), Disp: ,  Rfl:  .  prochlorperazine (COMPAZINE) 5 MG tablet, Take 1 tablet (5 mg total) by mouth every 6 (six) hours as needed for nausea or vomiting. (Patient not taking: Reported on 04/30/2023), Disp: 30 tablet, Rfl: 0  Current Facility-Administered Medications:  .  sodium chloride flush (NS) 0.9 % injection 3 mL, 3 mL, Intravenous, Q12H, Dunn, Dayna N, PA-C Medication Side Effects: none  Family Medical/ Social History: Changes? No  MENTAL HEALTH EXAM:  Blood pressure (!) 142/93, pulse 84, height 5\' 10"  (1.778 m), weight 219 lb  (99.3 kg).Body mass index is 31.42 kg/m.  General Appearance: Casual, Neat, and Well Groomed  Eye Contact:  Good  Speech:  Clear and Coherent  Volume:  Normal  Mood:  Depressed  Affect:  Congruent and Flat  Thought Process:  Coherent  Orientation:  Full (Time, Place, and Person)  Thought Content: Logical   Suicidal Thoughts:  No  Homicidal Thoughts:  No  Memory:  WNL  Judgement:  Good  Insight:  Good  Psychomotor Activity:  Normal  Concentration:  Concentration: Good  Recall:  Good  Fund of Knowledge: Good  Language: Good  Assets:  Desire for Improvement  ADL's:  Intact  Cognition: WNL  Prognosis:  Good    DIAGNOSES:    ICD-10-CM   1. Adjustment disorder with mixed anxiety and depressed mood  F43.23 buPROPion (WELLBUTRIN XL) 150 MG 24 hr tablet    traZODone (DESYREL) 50 MG tablet    2. Major depressive disorder, recurrent episode, moderate (HCC)  F33.1       Receiving Psychotherapy: No    RECOMMENDATIONS:  Greater than 50% of 60 min face to face time with patient was spent on counseling and coordination of care. We discussed his recent history of depression.  Talked about family dynamics and his family counseling him about exhibiting signs of depression.  We discussed medications and reviewed possible side effects.  We talked about good sleep hygiene.  Going to bed same time every night, making room dark, no bluelight contamination or electronics on when trying to go to bed.  We agreed today to:  Will start Wellbutrin 150 mg XL daily after breakfast Will start trazodone 50 mg at bedtime for assistance and improving sleep quality. To follow-up in 4 weeks to reassess Provided emergency contact information Will report worsening symptoms or side effects promptly Reviewed PDMP      Joan Flores, NP

## 2023-04-30 NOTE — Progress Notes (Signed)
Daily Session Note  Patient Details  Name: Jeremy Lopez MRN: [id number] Date of Birth: 08/17/1951 Referring Provider:   Flowsheet Row PULMONARY REHAB OTHER RESP ORIENTATION from 03/08/2023 in [facility] CARDIAC REHABILITATION  Referring Provider Dr. Dietrich Pates       Encounter Date: 04/30/2023  Check In:  Session Check In - 04/30/23 1445       Check-In   Supervising physician immediately available to respond to emergencies See telemetry face sheet for immediately available MD    Physician(s) Dr. Wyline Mood    Location [facility]    Staff Present Ross Ludwig, BS, Exercise Physiologist;Daphyne Daphine Deutscher, RN, BSN    Virtual Visit No    Medication changes reported     No    Fall or balance concerns reported    Yes    Comments Patient reports poor balance and exhibits shuffling gait.    Tobacco Cessation No Change    Warm-up and Cool-down Performed on first and last piece of equipment    Resistance Training Performed Yes    VAD Patient? No    PAD/SET Patient? No      Pain Assessment   Currently in Pain? No/denies    Pain Score 0-No pain    Multiple Pain Sites No             Capillary Blood Glucose: No results found for this or any previous visit (from the past 24 hour(s)).    Social History   Tobacco Use  Smoking Status Former   Current packs/day: 0.00   Types: Cigarettes   Start date: 10/25/1966   Quit date: 10/25/1998   Years since quitting: 24.5  Smokeless Tobacco Never    Goals Met:  Independence with exercise equipment Exercise tolerated well No report of concerns or symptoms today Strength training completed today  Goals Unmet:  Not Applicable  Comments: Pt able to follow exercise prescription today without complaint.  Will continue to monitor for progression.    Dr. Erick Blinks is Medical Director for [facility] Pul[facility].

## 2023-04-30 NOTE — Progress Notes (Signed)
[facility] MD/PA/NP Initial Note  04/30/2023 3:35 PM Jeremy Lopez  MRN:  [id number]  Chief Complaint:  Chief Complaint   Depression; Medication Refill; Establish Care; Patient Education     HPI:    Edis", 72 year old male presents to this office for initial visit and to establish care.  Collateral information should be considered reliable says that his family has been talking to him about seeking care for depression.  Patient says "heck I do not even know if I can define depression or what it is, but I guess did not have not really wanted to do a whole lot or do the things that I used to do".  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  Says that he would like to assess today whether he has depression levels enough to warrant taking medication.  Says that he also would like to discuss his sleep.  Says that he gets about 6 hours total per night but does not like waking up frequently.  Says that he is open to medication to help with sleep quality.   No past psychiatric medication trials        Visit Diagnosis:    ICD-10-CM   1. Adjustment disorder with mixed anxiety and depressed mood  F43.23 buPROPion (WELLBUTRIN XL) 150 MG 24 hr tablet    traZODone (DESYREL) 50 MG tablet    2. Major depressive disorder, recurrent episode, moderate (HCC)  F33.1       Past Psychiatric History: None  Past Medical History:  Past Medical History:  Diagnosis Date   CAD (coronary artery disease), native coronary artery    DES proximal LAD 07/2009; DES to RCA 04/2020   Essential hypertension    Habitual alcohol use    Hyperlipidemia    Small cell lung cancer (HCC)    Right upper lobe 2008; no evidence of disease since treatment ended in 11/2007    Past Surgical History:  Procedure Laterality Date   CORONARY ATHERECTOMY N/A 04/26/2020   Procedure:  CORONARY ATHERECTOMY;  Surgeon: Corky Crafts, MD;  Location: [facility] INVASIVE CV LAB;  Service: Cardiovascular;  Laterality: N/A;   CORONARY STENT INTERVENTION N/A 04/26/2020   Procedure: CORONARY STENT INTERVENTION;  Surgeon: Corky Crafts, MD;  Location: [facility] INVASIVE CV LAB;  Service: Cardiovascular;  Laterality: N/A;   CORONARY ULTRASOUND/IVUS N/A 04/26/2020   Procedure: Intravascular Ultrasound/IVUS;  Surgeon: Corky Crafts, MD;  Location: [facility] INVASIVE CV LAB;  Service: Cardiovascular;  Laterality: N/A;   Insertion of left subclavian Port-A-Cath  2008   LEFT HEART CATH AND CORONARY ANGIOGRAPHY N/A 07/03/2022   Procedure: LEFT HEART CATH AND CORONARY ANGIOGRAPHY;  Surgeon: Swaziland, Peter M, MD;  Location: [facility] INVASIVE CV LAB;  Service: Cardiovascular;  Laterality: N/A;   RIGHT/LEFT HEART CATH AND CORONARY ANGIOGRAPHY N/A 04/26/2020   Procedure: RIGHT/LEFT HEART CATH AND CORONARY ANGIOGRAPHY;  Surgeon: Corky Crafts, MD;  Location: [facility] INVASIVE CV LAB;  Service: Cardiovascular;  Laterality: N/A;   TEMPORARY PACEMAKER N/A 04/26/2020   Procedure: TEMPORARY PACEMAKER;  Surgeon: Corky Crafts, MD;  Location: [facility] INVASIVE CV LAB;  Service: Cardiovascular;  Laterality: N/A;   Video bronchoscopy and video mediastinoscopy  2008    Family Psychiatric History: see chart  Family History:  Family History  Problem Relation Age of Onset   Cancer Father    CAD  Brother    Hypertension Brother    Depression Maternal Uncle    Alcohol abuse Maternal Uncle     Social History:  Social History   Socioeconomic History   Marital status: Married    Spouse name: [name]   Number of children: 2   Years of education: 14   Highest education level: Tax adviser degree: occupational, Scientist, product/process development, or vocational program  Occupational History   Occupation: Full time    Comment: Industrial/product designer for [employer]   Occupation: Retired Industrial/product designer    Comment: [employer]  Tobacco Use   Smoking  status: Former    Current packs/day: 0.00    Types: Cigarettes    Start date: 10/25/1966    Quit date: 10/25/1998    Years since quitting: 24.5   Smokeless tobacco: Never  Vaping Use   Vaping status: Never Used  Substance and Sexual Activity   Alcohol use: Yes    Alcohol/week: 14.0 standard drinks of alcohol    Types: 14 Cans of beer per week    Comment: 1 drink per day   Drug use: Yes    Types: Marijuana   Sexual activity: Yes  Other Topics Concern   Not on file  Social History Narrative   Lives in [place] with wife. Enjoys reading, playing golf in free time.    Social Determinants of Health   Financial Resource Strain: Not on file  Food Insecurity: Not on file  Transportation Needs: Not on file  Physical Activity: Not on file  Stress: Not on file  Social Connections: Not on file    Allergies:  Allergies  Allergen Reactions   Cephalexin Anaphylaxis    throat swelling Patient said he can take amoxicillin   Amlodipine Swelling    Leg swelling    Metabolic Disorder Labs: Lab Results  Component Value Date   HGBA1C 5.8 (H) 01/18/2023   MPG 120 (H) 02/08/2014   No results found for: "PROLACTIN" Lab Results  Component Value Date   CHOL 162 01/18/2023   TRIG 189 (H) 01/18/2023   HDL 54 01/18/2023   CHOLHDL 3.0 01/18/2023   VLDL 22 06/13/2022   LDLCALC 76 01/18/2023   LDLCALC 40 06/13/2022   Lab Results  Component Value Date   TSH 2.590 01/18/2023   TSH 2.578 06/13/2022    Therapeutic Level Labs: No results found for: "LITHIUM" No results found for: "VALPROATE" No results found for: "CBMZ"  Current Medications: Current Outpatient Medications  Medication Sig Dispense Refill   albuterol (VENTOLIN HFA) 108 (90 Base) MCG/ACT inhaler Inhale 2 puffs into the lungs every 6 (six) hours as needed for wheezing or shortness of breath.     buPROPion (WELLBUTRIN XL) 150 MG 24 hr tablet Take 1 tablet (150 mg total) by mouth daily. 30 tablet 1   Cholecalciferol  (VITAMIN D) 50 MCG (2000 UT) tablet Take 2,000 Units by mouth daily.     clopidogrel (PLAVIX) 75 MG tablet Take 1 tablet (75 mg total) by mouth daily with breakfast. 90 tablet 3   diltiazem (DILACOR XR) 240 MG 24 hr capsule Take 1 capsule (240 mg total) by mouth daily. 90 capsule 3   empagliflozin (JARDIANCE) 10 MG TABS tablet Take 1 tablet (10 mg total) by mouth daily before breakfast. 30 tablet 11   Fluticasone-Umeclidin-Vilant (TRELEGY ELLIPTA) 100-62.5-25 MCG/ACT AEPB Inhale 1 puff into the lungs daily. 180 each 3   gabapentin (NEURONTIN) 300 MG capsule Take 1 capsule (300 mg total) by mouth at bedtime. 90 capsule  3   metoprolol succinate (TOPROL-XL) 100 MG 24 hr tablet TAKE ONE TABLET (100MG  TOTAL) BY MOUTH DAILY. TAKE WITH OR IMMEDIATELY FOLLOWING A MEAL. 90 tablet 2   nitroGLYCERIN (NITROSTAT) 0.4 MG SL tablet Place 1 tablet (0.4 mg total) under the tongue every 5 (five) minutes as needed for chest pain. 25 tablet 2   rosuvastatin (CRESTOR) 40 MG tablet TAKE ONE (1) TABLET BY MOUTH EVERY DAY 90 tablet 3   sacubitril-valsartan (ENTRESTO) 49-51 MG Take 1 tablet by mouth 2 (two) times daily. 60 tablet 11   Spacer/Aero-Holding Chambers DEVI Use with inhaler 1 each 2   traZODone (DESYREL) 50 MG tablet Take 1 tablet (50 mg total) by mouth at bedtime. 30 tablet 1   fluticasone (FLONASE) 50 MCG/ACT nasal spray Place 2 sprays into both nostrils once a week. (Patient not taking: Reported on 03/07/2023)     prochlorperazine (COMPAZINE) 5 MG tablet Take 1 tablet (5 mg total) by mouth every 6 (six) hours as needed for nausea or vomiting. (Patient not taking: Reported on 04/30/2023) 30 tablet 0   Current Facility-Administered Medications  Medication Dose Route Frequency Provider Last Rate Last Admin   sodium chloride flush (NS) 0.9 % injection 3 mL  3 mL Intravenous Q12H Dunn, Dayna N, PA-C        Medication Side Effects: none  Orders placed this visit:  No orders of the defined types were placed in  this encounter.   Psychiatric Specialty Exam:  Review of Systems  Constitutional: Negative.   Allergic/Immunologic: Negative.   Neurological: Negative.   Psychiatric/Behavioral:  Positive for dysphoric mood.     Blood pressure (!) 142/93, pulse 84, height 5\' 10"  (1.778 m), weight 219 lb (99.3 kg).Body mass index is 31.42 kg/m.  General Appearance: Casual, Neat, and Well Groomed  Eye Contact:  Good  Speech:  Clear and Coherent  Volume:  Normal  Mood:  Depressed  Affect:  Congruent and Depressed  Thought Process:  Coherent  Orientation:  Full (Time, Place, and Person)  Thought Content: Logical   Suicidal Thoughts:  No  Homicidal Thoughts:  No  Memory:  WNL  Judgement:  Good  Insight:  Good  Psychomotor Activity:  Normal  Concentration:  Concentration: Good  Recall:  Good  Fund of Knowledge: Good  Language: Good  Assets:  Desire for Improvement  ADL's:  Intact  Cognition: WNL  Prognosis:  Good   Screenings:  AUDIT    Flowsheet Row Counselor from 04/03/2023 in [facility]  Alcohol Use Disorder Identification Test Final Score (AUDIT) 13      CAGE-AID    Flowsheet Row Counselor from 04/03/2023 in [facility]  CAGE-AID Score 4      GAD-7    Flowsheet Row Counselor from 04/03/2023 in [facility] Office Visit from 01/18/2023 in [facility]  Total GAD-7 Score 6 0      PHQ2-9    Flowsheet Row Office Visit from 04/30/2023 in [facility] Counselor from 04/03/2023 in [facility] PULMONARY REHAB OTHER RESP ORIENTATION from 03/08/2023 in [facility] CARDIAC REHABILITATION Office Visit from 01/18/2023 in [facility] CARDIAC REHAB PHASE II EXERCISE from 10/21/2020 in [facility] CARDIAC REHABILITATION  PHQ-2 Total Score 2 4 0 0 0  PHQ-9 Total Score 6 10 2 6  0       Receiving  Psychotherapy: No   Treatment Plan/Recommendations:  Greater than 50% of 60 min face to face time with patient was spent on counseling and coordination of care. We discussed his recent history of depression.  Talked about family dynamics and his family counseling him about exhibiting signs of depression.  We discussed medications and reviewed possible side effects.  We talked about good sleep hygiene.  Going to bed same time every night, making room dark, no bluelight contamination or electronics on when trying to go to bed.  We agreed today to:  Will start Wellbutrin 150 mg XL daily after breakfast Will start trazodone 50 mg at bedtime for assistance and improving sleep quality. To follow-up in 4 weeks to reassess Provided emergency contact information Will report worsening symptoms or side effects promptly Reviewed PDMP   Joan Flores, NP

## 2023-05-02 ENCOUNTER — Encounter (HOSPITAL_COMMUNITY): Payer: HMO

## 2023-05-07 ENCOUNTER — Encounter (HOSPITAL_COMMUNITY): Payer: HMO

## 2023-05-08 ENCOUNTER — Encounter (HOSPITAL_COMMUNITY): Payer: Self-pay

## 2023-05-08 DIAGNOSIS — I509 Heart failure, unspecified: Secondary | ICD-10-CM

## 2023-05-09 ENCOUNTER — Encounter (HOSPITAL_COMMUNITY): Payer: HMO

## 2023-05-13 ENCOUNTER — Encounter: Payer: Self-pay | Admitting: Diagnostic Neuroimaging

## 2023-05-13 ENCOUNTER — Ambulatory Visit: Payer: HMO | Admitting: Diagnostic Neuroimaging

## 2023-05-13 ENCOUNTER — Telehealth: Payer: Self-pay | Admitting: Diagnostic Neuroimaging

## 2023-05-13 VITALS — BP 173/89 | HR 64 | Ht 70.0 in | Wt 218.8 lb

## 2023-05-13 DIAGNOSIS — R269 Unspecified abnormalities of gait and mobility: Secondary | ICD-10-CM | POA: Diagnosis not present

## 2023-05-13 NOTE — Progress Notes (Signed)
[facility]  PATIENT: Jeremy Lopez DOB: December 13, 1950  REFERRING CLINICIAN: [name], MD HISTORY FROM: patient REASON FOR VISIT: new consult   HISTORICAL  CHIEF COMPLAINT:  Chief Complaint  Patient presents with   New Patient (Initial Visit)    Rm 6. Accompanied by wife. NP/internal referral for LE paresthesias. He reports difficulty maintaining balance, tilts forward when he walks. Reports weakness in bilateral LE. Gabapentin has helped with bilateral foot tingling and coldness.    HISTORY OF PRESENT ILLNESS:   72 year old male here for evaluation of gait and balance difficulty.  Symptoms started around 2016 when he noticed that his right leg was dragging on the ground.  Symptoms progressively worsened over time.  Now he has a shuffling gait, leaning forward posture, sometimes loses balance or his upper body goes forward and feet get stuck to the ground.  No definite tremor.  Noted speech or swallowing issues.  Has slowed down in general.  History of lung cancer 2008 treated with chemotherapy and radiation.   REVIEW OF SYSTEMS: Full 14 system review of systems performed and negative with exception of: as per hPI.  ALLERGIES: Allergies  Allergen Reactions   Cephalexin Anaphylaxis    throat swelling Patient said he can take amoxicillin   Amlodipine Swelling    Leg swelling    HOME MEDICATIONS: Outpatient Medications Prior to Visit  Medication Sig Dispense Refill   albuterol (VENTOLIN HFA) 108 (90 Base) MCG/ACT inhaler Inhale 2 puffs into the lungs every 6 (six) hours as needed for wheezing or shortness of breath.     buPROPion (WELLBUTRIN XL) 150 MG 24 hr tablet Take 1 tablet (150 mg total) by mouth daily. 30 tablet 1   Cholecalciferol (VITAMIN D) 50 MCG (2000 UT) tablet Take 2,000 Units by mouth daily.     clopidogrel (PLAVIX) 75 MG tablet Take 1 tablet (75 mg total) by mouth daily with breakfast. 90 tablet 3   diltiazem (DILACOR XR) 240 MG  24 hr capsule Take 1 capsule (240 mg total) by mouth daily. 90 capsule 3   empagliflozin (JARDIANCE) 10 MG TABS tablet Take 1 tablet (10 mg total) by mouth daily before breakfast. 30 tablet 11   Fluticasone-Umeclidin-Vilant (TRELEGY ELLIPTA) 100-62.5-25 MCG/ACT AEPB Inhale 1 puff into the lungs daily. 180 each 3   gabapentin (NEURONTIN) 300 MG capsule Take 1 capsule (300 mg total) by mouth at bedtime. 90 capsule 3   metoprolol succinate (TOPROL-XL) 100 MG 24 hr tablet TAKE ONE TABLET (100MG  TOTAL) BY MOUTH DAILY. TAKE WITH OR IMMEDIATELY FOLLOWING A MEAL. 90 tablet 2   prochlorperazine (COMPAZINE) 5 MG tablet Take 1 tablet (5 mg total) by mouth every 6 (six) hours as needed for nausea or vomiting. 30 tablet 0   rosuvastatin (CRESTOR) 40 MG tablet TAKE ONE (1) TABLET BY MOUTH EVERY DAY 90 tablet 3   sacubitril-valsartan (ENTRESTO) 49-51 MG Take 1 tablet by mouth 2 (two) times daily. 60 tablet 11   Spacer/Aero-Holding Chambers DEVI Use with inhaler 1 each 2   traZODone (DESYREL) 50 MG tablet Take 1 tablet (50 mg total) by mouth at bedtime. 30 tablet 1   nitroGLYCERIN (NITROSTAT) 0.4 MG SL tablet Place 1 tablet (0.4 mg total) under the tongue every 5 (five) minutes as needed for chest pain. (Patient not taking: Reported on 05/13/2023) 25 tablet 2   fluticasone (FLONASE) 50 MCG/ACT nasal spray Place 2 sprays into both nostrils once a week. (Patient not taking: Reported on 03/07/2023)  Facility-Administered Medications Prior to Visit  Medication Dose Route Frequency Provider Last Rate Last Admin   sodium chloride flush (NS) 0.9 % injection 3 mL  3 mL Intravenous Q12H Laurann Montana, PA-C        PAST MEDICAL HISTORY: Past Medical History:  Diagnosis Date   CAD (coronary artery disease), native coronary artery    DES proximal LAD 07/2009; DES to RCA 04/2020   Essential hypertension    Habitual alcohol use    Hyperlipidemia    Small cell lung cancer (HCC)    Right upper lobe 2008; no evidence of  disease since treatment ended in 11/2007    PAST SURGICAL HISTORY: Past Surgical History:  Procedure Laterality Date   CORONARY ATHERECTOMY N/A 04/26/2020   Procedure: CORONARY ATHERECTOMY;  Surgeon: Corky Crafts, MD;  Location: [facility] INVASIVE CV LAB;  Service: Cardiovascular;  Laterality: N/A;   CORONARY STENT INTERVENTION N/A 04/26/2020   Procedure: CORONARY STENT INTERVENTION;  Surgeon: Corky Crafts, MD;  Location: [facility] INVASIVE CV LAB;  Service: Cardiovascular;  Laterality: N/A;   CORONARY ULTRASOUND/IVUS N/A 04/26/2020   Procedure: Intravascular Ultrasound/IVUS;  Surgeon: Corky Crafts, MD;  Location: [facility] INVASIVE CV LAB;  Service: Cardiovascular;  Laterality: N/A;   Insertion of left subclavian Port-A-Cath  2008   LEFT HEART CATH AND CORONARY ANGIOGRAPHY N/A 07/03/2022   Procedure: LEFT HEART CATH AND CORONARY ANGIOGRAPHY;  Surgeon: Swaziland, Peter M, MD;  Location: [facility] INVASIVE CV LAB;  Service: Cardiovascular;  Laterality: N/A;   RIGHT/LEFT HEART CATH AND CORONARY ANGIOGRAPHY N/A 04/26/2020   Procedure: RIGHT/LEFT HEART CATH AND CORONARY ANGIOGRAPHY;  Surgeon: Corky Crafts, MD;  Location: [facility] INVASIVE CV LAB;  Service: Cardiovascular;  Laterality: N/A;   TEMPORARY PACEMAKER N/A 04/26/2020   Procedure: TEMPORARY PACEMAKER;  Surgeon: Corky Crafts, MD;  Location: [facility] INVASIVE CV LAB;  Service: Cardiovascular;  Laterality: N/A;   Video bronchoscopy and video mediastinoscopy  2008    FAMILY HISTORY: Family History  Problem Relation Age of Onset   Cancer Father    CAD Brother    Hypertension Brother    Depression Maternal Uncle    Alcohol abuse Maternal Uncle     SOCIAL HISTORY: Social History   Socioeconomic History   Marital status: Married    Spouse name: [name]   Number of children: 2   Years of education: 14   Highest education level: Tax adviser degree: occupational, Scientist, product/process development, or vocational program  Occupational History   Occupation: Full time     Comment: Industrial/product designer for [employer]   Occupation: Retired Industrial/product designer    Comment: [employer]  Tobacco Use   Smoking status: Former    Current packs/day: 0.00    Types: Cigarettes    Start date: 10/25/1966    Quit date: 10/25/1998    Years since quitting: 24.5   Smokeless tobacco: Never  Vaping Use   Vaping status: Never Used  Substance and Sexual Activity   Alcohol use: Yes    Alcohol/week: 14.0 standard drinks of alcohol    Types: 14 Cans of beer per week    Comment: 1 drink per day   Drug use: Yes    Types: Marijuana   Sexual activity: Yes  Other Topics Concern   Not on file  Social History Narrative   Lives in [place] with wife. Enjoys reading, playing golf in free time.    Social Determinants of Health   Financial Resource Strain: Not on file  Food Insecurity: Not on  file  Transportation Needs: Not on file  Physical Activity: Not on file  Stress: Not on file  Social Connections: Not on file  Intimate Partner Violence: Not on file     PHYSICAL EXAM  GENERAL EXAM/CONSTITUTIONAL: Vitals:  Vitals:   05/13/23 0956 05/13/23 1003  BP: (!) 175/80 (!) 173/89  Pulse: 67 64  Weight: 218 lb 12.8 oz (99.2 kg)   Height: 5\' 10"  (1.778 m)    Body mass index is 31.39 kg/m. Wt Readings from Last 3 Encounters:  05/13/23 218 lb 12.8 oz (99.2 kg)  03/12/23 219 lb 5.7 oz (99.5 kg)  03/08/23 219 lb 5.7 oz (99.5 kg)   Patient is in no distress; well developed, nourished and groomed; neck is supple  CARDIOVASCULAR: Examination of carotid arteries is normal; no carotid bruits Regular rate and rhythm, no murmurs Examination of peripheral vascular system by observation and palpation is normal  EYES: Ophthalmoscopic exam of optic discs and posterior segments is normal; no papilledema or hemorrhages No results found.  MUSCULOSKELETAL: Gait, strength, tone, movements noted in Neurologic exam below  NEUROLOGIC: MENTAL STATUS:      No data to display          awake, alert, oriented to person, place and time recent and remote memory intact normal attention and concentration language fluent, comprehension intact, naming intact fund of knowledge appropriate  CRANIAL NERVE:  2nd - no papilledema on fundoscopic exam 2nd, 3rd, 4th, 6th - pupils equal and reactive to light, visual fields full to confrontation, extraocular muscles intact, no nystagmus 5th - facial sensation symmetric 7th - facial strength symmetric 8th - hearing intact 9th - palate elevates symmetrically, uvula midline 11th - shoulder shrug symmetric 12th - tongue protrusion midline MASKED FACIES  MOTOR:  normal bulk and tone, full strength in the BUE, BLE SLIGHTLY INCREASED TONE IN LUE  SENSORY:  normal and symmetric to light touch, temperature, vibration  COORDINATION:  finger-nose-finger, fine finger movements normal  REFLEXES:  deep tendon reflexes 1+ and symmetric  GAIT/STATION:  narrow based gait; STOOPED POSTURE, DECREASE ARM SWING; SHORT STEPS    DIAGNOSTIC DATA (LABS, IMAGING, TESTING) - I reviewed patient records, labs, notes, testing and imaging myself where available.  Lab Results  Component Value Date   WBC 7.7 01/18/2023   HGB 15.5 01/18/2023   HCT 46.5 01/18/2023   MCV 92 01/18/2023   PLT 350 01/18/2023      Component Value Date/Time   NA 140 01/18/2023 1053   K 5.2 01/18/2023 1053   CL 103 01/18/2023 1053   CO2 22 01/18/2023 1053   GLUCOSE 91 01/18/2023 1053   GLUCOSE 105 (H) 06/28/2022 1218   BUN 19 01/18/2023 1053   CREATININE 1.18 01/18/2023 1053   CREATININE 1.21 12/28/2019 1501   CALCIUM 9.4 01/18/2023 1053   PROT 6.9 01/18/2023 1053   ALBUMIN 4.4 01/18/2023 1053   AST 15 01/18/2023 1053   ALT 15 01/18/2023 1053   ALKPHOS 95 01/18/2023 1053   BILITOT 0.4 01/18/2023 1053   GFRNONAA >60 06/28/2022 1218   GFRAA >60 04/19/2020 1348   Lab Results  Component Value Date   CHOL 162 01/18/2023   HDL 54 01/18/2023   LDLCALC 76  01/18/2023   TRIG 189 (H) 01/18/2023   CHOLHDL 3.0 01/18/2023   Lab Results  Component Value Date   HGBA1C 5.8 (H) 01/18/2023   Lab Results  Component Value Date   VITAMINB12 261 01/18/2023   Lab Results  Component Value Date  TSH 2.590 01/18/2023    06/08/08 MRI brain [I reviewed images myself and agree with interpretation. -VRP]  - Stable. No acute or metastatic intracranial abnormality.   10/17/17 MRI brain [I reviewed images myself and agree with interpretation. -VRP]  -No evidence metastatic disease. No focal brain insult. No abnormality seen to explain headache. Chronic ventriculomegaly which may be the patient's baseline. Is there any clinical concern regarding normal pressure hydrocephalus? - Abnormal flow in the left vertebral artery which could be slow flow or distal stenosis.   ASSESSMENT AND PLAN  72 y.o. year old male here with:   Dx:  1. Gait difficulty     PLAN:  GAIT DIFFICULTY (mild cogwheeling rigidity in left upper ext; stooped posture; short steps; mild parkinsonism; also with ventriculomegaly in 2019) - check MRI brain - consider LP (although patient not that interested in shunt evaluation for possible NPH) - consider DATscan - consider PT evaluation  Orders Placed This Encounter  Procedures   MR BRAIN W WO CONTRAST   Ambulatory referral to Physical Therapy   Return for pending if symptoms worsen or fail to improve, pending test results.    Suanne Marker, MD 05/13/2023, 10:51 AM Certified in Neurology, Neurophysiology and Neuroimaging  [facility] [address], [address] [place], [place] [address] [phone]

## 2023-05-13 NOTE — Telephone Encounter (Signed)
[place] adv NPR sent to [facility] [phone]

## 2023-05-14 ENCOUNTER — Encounter (HOSPITAL_COMMUNITY): Payer: HMO

## 2023-05-16 ENCOUNTER — Encounter (HOSPITAL_COMMUNITY): Payer: HMO

## 2023-05-16 ENCOUNTER — Encounter (HOSPITAL_COMMUNITY): Payer: Self-pay | Admitting: *Deleted

## 2023-05-16 DIAGNOSIS — I509 Heart failure, unspecified: Secondary | ICD-10-CM

## 2023-05-21 ENCOUNTER — Encounter (HOSPITAL_COMMUNITY): Payer: HMO

## 2023-05-22 ENCOUNTER — Encounter (HOSPITAL_COMMUNITY): Payer: Self-pay | Admitting: *Deleted

## 2023-05-22 DIAGNOSIS — I509 Heart failure, unspecified: Secondary | ICD-10-CM

## 2023-05-22 NOTE — Progress Notes (Signed)
Pulmonary Individual Treatment Plan  Patient Details  Name: ALVONTE PIKER MRN: [id number] Date of Birth: 1951-05-11 Referring Provider:   Flowsheet Row PULMONARY REHAB OTHER RESP ORIENTATION from 03/08/2023 in [facility] CARDIAC REHABILITATION  Referring Provider Dr. Dietrich Pates       Initial Encounter Date:  Flowsheet Row PULMONARY REHAB OTHER RESP ORIENTATION from 03/08/2023 in [facility] CARDIAC REHABILITATION  Date 03/08/23       Visit Diagnosis: Chronic congestive heart failure, unspecified heart failure type (HCC)  Patient's Home Medications on Admission:   Current Outpatient Medications:    albuterol (VENTOLIN HFA) 108 (90 Base) MCG/ACT inhaler, Inhale 2 puffs into the lungs every 6 (six) hours as needed for wheezing or shortness of breath., Disp: , Rfl:    buPROPion (WELLBUTRIN XL) 150 MG 24 hr tablet, Take 1 tablet (150 mg total) by mouth daily., Disp: 30 tablet, Rfl: 1   Cholecalciferol (VITAMIN D) 50 MCG (2000 UT) tablet, Take 2,000 Units by mouth daily., Disp: , Rfl:    clopidogrel (PLAVIX) 75 MG tablet, Take 1 tablet (75 mg total) by mouth daily with breakfast., Disp: 90 tablet, Rfl: 3   diltiazem (DILACOR XR) 240 MG 24 hr capsule, Take 1 capsule (240 mg total) by mouth daily., Disp: 90 capsule, Rfl: 3   empagliflozin (JARDIANCE) 10 MG TABS tablet, Take 1 tablet (10 mg total) by mouth daily before breakfast., Disp: 30 tablet, Rfl: 11   Fluticasone-Umeclidin-Vilant (TRELEGY ELLIPTA) 100-62.5-25 MCG/ACT AEPB, Inhale 1 puff into the lungs daily., Disp: 180 each, Rfl: 3   gabapentin (NEURONTIN) 300 MG capsule, Take 1 capsule (300 mg total) by mouth at bedtime., Disp: 90 capsule, Rfl: 3   metoprolol succinate (TOPROL-XL) 100 MG 24 hr tablet, TAKE ONE TABLET (100MG  TOTAL) BY MOUTH DAILY. TAKE WITH OR IMMEDIATELY FOLLOWING A MEAL., Disp: 90 tablet, Rfl: 2   nitroGLYCERIN (NITROSTAT) 0.4 MG SL tablet, Place 1 tablet (0.4 mg total) under the tongue every 5 (five) minutes as needed  for chest pain. (Patient not taking: Reported on 05/13/2023), Disp: 25 tablet, Rfl: 2   prochlorperazine (COMPAZINE) 5 MG tablet, Take 1 tablet (5 mg total) by mouth every 6 (six) hours as needed for nausea or vomiting., Disp: 30 tablet, Rfl: 0   rosuvastatin (CRESTOR) 40 MG tablet, TAKE ONE (1) TABLET BY MOUTH EVERY DAY, Disp: 90 tablet, Rfl: 3   sacubitril-valsartan (ENTRESTO) 49-51 MG, Take 1 tablet by mouth 2 (two) times daily., Disp: 60 tablet, Rfl: 11   Spacer/Aero-Holding Chambers DEVI, Use with inhaler, Disp: 1 each, Rfl: 2   traZODone (DESYREL) 50 MG tablet, Take 1 tablet (50 mg total) by mouth at bedtime., Disp: 30 tablet, Rfl: 1  Current Facility-Administered Medications:    sodium chloride flush (NS) 0.9 % injection 3 mL, 3 mL, Intravenous, Q12H, Dunn, Tacey Ruiz, PA-C  Past Medical History: Past Medical History:  Diagnosis Date   CAD (coronary artery disease), native coronary artery    DES proximal LAD 07/2009; DES to RCA 04/2020   Essential hypertension    Habitual alcohol use    Hyperlipidemia    Small cell lung cancer (HCC)    Right upper lobe 2008; no evidence of disease since treatment ended in 11/2007    Tobacco Use: Social History   Tobacco Use  Smoking Status Former   Current packs/day: 0.00   Types: Cigarettes   Start date: 10/25/1966   Quit date: 10/25/1998   Years since quitting: 24.5  Smokeless Tobacco Never    Labs:  Review Flowsheet  More data exists      Latest Ref Rng & Units 12/28/2019 04/26/2020 01/02/2021 06/13/2022 01/18/2023  Labs for ITP Cardiac and Pulmonary Rehab  Cholestrol 100 - 199 mg/dL 562  - 130  865  784   LDL (calc) 0 - 99 mg/dL 61  - 49  40  76   HDL-C >39 mg/dL 42  - 49  51  54   Trlycerides 0 - 149 mg/dL 696  - 295  284  132   Hemoglobin A1c 4.8 - 5.6 % - - - - 5.8   PH, Arterial 7.350 - 7.450 - 7.386  - - -  PCO2 arterial 32.0 - 48.0 mmHg - 45.1  - - -  Bicarbonate 20.0 - 28.0 mmol/L - 27.0  25.1  - - -  TCO2 22 - 32 mmol/L - 28  26  -  - -  O2 Saturation % - 72.0  94.0  - - -    Details       Multiple values from one day are sorted in reverse-chronological order         Capillary Blood Glucose: Lab Results  Component Value Date   GLUCAP 101 (H) 03/21/2023   GLUCAP 106 (H) 03/19/2023   GLUCAP 89 03/19/2023   GLUCAP 95 03/14/2023   GLUCAP 104 (H) 03/12/2023     Pulmonary Assessment Scores:  Pulmonary Assessment Scores     Row Name 03/08/23 1345         ADL UCSD   ADL Phase Entry     SOB Score total 15     Rest 0     Walk 1     Stairs 1     Bath 0     Dress 0     Shop 2       CAT Score   CAT Score 14       mMRC Score   mMRC Score 2             UCSD: Self-administered rating of dyspnea associated with activities of daily living (ADLs) 6-point scale (0 = "not at all" to 5 = "maximal or unable to do because of breathlessness")  Scoring Scores range from 0 to 120.  Minimally important difference is 5 units  CAT: CAT can identify the health impairment of COPD patients and is better correlated with disease progression.  CAT has a scoring range of zero to 40. The CAT score is classified into four groups of low (less than 10), medium (10 - 20), high (21-30) and very high (31-40) based on the impact level of disease on health status. A CAT score over 10 suggests significant symptoms.  A worsening CAT score could be explained by an exacerbation, poor medication adherence, poor inhaler technique, or progression of COPD or comorbid conditions.  CAT MCID is 2 points  mMRC: mMRC (Modified Medical Research Council) Dyspnea Scale is used to assess the degree of baseline functional disability in patients of respiratory disease due to dyspnea. No minimal important difference is established. A decrease in score of 1 point or greater is considered a positive change.   Pulmonary Function Assessment:   Exercise Target Goals: Exercise Program Goal: Individual exercise prescription set using results from  initial 6 min walk test and THRR while considering  patient's activity barriers and safety.   Exercise Prescription Goal: Initial exercise prescription builds to 30-45 minutes a day of aerobic activity, 2-3 days per week.  Home exercise guidelines  will be given to patient during program as part of exercise prescription that the participant will acknowledge.  Activity Barriers & Risk Stratification:  Activity Barriers & Cardiac Risk Stratification - 03/08/23 1246       Activity Barriers & Cardiac Risk Stratification   Activity Barriers Shortness of Breath    Cardiac Risk Stratification High             6 Minute Walk:  6 Minute Walk     Row Name 03/08/23 1346         6 Minute Walk   Phase Initial     Distance 1200 feet     Walk Time 6 minutes     # of Rest Breaks 0     MPH 2.27     METS 2.63     RPE 11     Perceived Dyspnea  11     VO2 Peak 9.2     Symptoms No     Resting HR 75 bpm     Resting BP 120/68     Resting Oxygen Saturation  95 %     Exercise Oxygen Saturation  during 6 min walk 93 %     Max Ex. HR 103 bpm     Max Ex. BP 148/76     2 Minute Post BP 124/76       Interval HR   1 Minute HR 95     2 Minute HR 100     3 Minute HR 100     4 Minute HR 101     5 Minute HR 102     6 Minute HR 103     2 Minute Post HR 83     Interval Heart Rate? Yes       Interval Oxygen   Interval Oxygen? Yes     Baseline Oxygen Saturation % 95 %     1 Minute Oxygen Saturation % 94 %     1 Minute Liters of Oxygen 0 L     2 Minute Oxygen Saturation % 93 %     2 Minute Liters of Oxygen 0 L     3 Minute Oxygen Saturation % 94 %     3 Minute Liters of Oxygen 0 L     4 Minute Oxygen Saturation % 94 %     4 Minute Liters of Oxygen 0 L     5 Minute Oxygen Saturation % 94 %     5 Minute Liters of Oxygen 0 L     6 Minute Oxygen Saturation % 94 %     6 Minute Liters of Oxygen 0 L     2 Minute Post Oxygen Saturation % 96 %     2 Minute Post Liters of Oxygen 0 L               Oxygen Initial Assessment:  Oxygen Initial Assessment - 03/08/23 1345       Home Oxygen   Home Oxygen Device None    Sleep Oxygen Prescription None    Home Exercise Oxygen Prescription None    Home Resting Oxygen Prescription None      Initial 6 min Walk   Oxygen Used None      Program Oxygen Prescription   Program Oxygen Prescription None      Intervention   Short Term Goals To learn and understand importance of monitoring SPO2 with pulse oximeter and demonstrate accurate use of the pulse oximeter.;To learn and  understand importance of maintaining oxygen saturations>88%;To learn and demonstrate proper pursed lip breathing techniques or other breathing techniques.     Long  Term Goals Verbalizes importance of monitoring SPO2 with pulse oximeter and return demonstration;Maintenance of O2 saturations>88%;Exhibits proper breathing techniques, such as pursed lip breathing or other method taught during program session;Compliance with respiratory medication             Oxygen Re-Evaluation:  Oxygen Re-Evaluation     Row Name 03/26/23 1300 04/23/23 1525           Program Oxygen Prescription   Program Oxygen Prescription None None        Home Oxygen   Home Oxygen Device None None      Sleep Oxygen Prescription None None      Home Exercise Oxygen Prescription None None      Home Resting Oxygen Prescription None None        Goals/Expected Outcomes   Short Term Goals To learn and understand importance of monitoring SPO2 with pulse oximeter and demonstrate accurate use of the pulse oximeter.;To learn and understand importance of maintaining oxygen saturations>88%;To learn and demonstrate proper pursed lip breathing techniques or other breathing techniques.  To learn and understand importance of monitoring SPO2 with pulse oximeter and demonstrate accurate use of the pulse oximeter.;To learn and understand importance of maintaining oxygen saturations>88%;To learn and demonstrate  proper pursed lip breathing techniques or other breathing techniques.       Long  Term Goals Verbalizes importance of monitoring SPO2 with pulse oximeter and return demonstration;Maintenance of O2 saturations>88%;Exhibits proper breathing techniques, such as pursed lip breathing or other method taught during program session;Compliance with respiratory medication Verbalizes importance of monitoring SPO2 with pulse oximeter and return demonstration;Maintenance of O2 saturations>88%;Exhibits proper breathing techniques, such as pursed lip breathing or other method taught during program session;Compliance with respiratory medication               Oxygen Discharge (Final Oxygen Re-Evaluation):  Oxygen Re-Evaluation - 04/23/23 1525       Program Oxygen Prescription   Program Oxygen Prescription None      Home Oxygen   Home Oxygen Device None    Sleep Oxygen Prescription None    Home Exercise Oxygen Prescription None    Home Resting Oxygen Prescription None      Goals/Expected Outcomes   Short Term Goals To learn and understand importance of monitoring SPO2 with pulse oximeter and demonstrate accurate use of the pulse oximeter.;To learn and understand importance of maintaining oxygen saturations>88%;To learn and demonstrate proper pursed lip breathing techniques or other breathing techniques.     Long  Term Goals Verbalizes importance of monitoring SPO2 with pulse oximeter and return demonstration;Maintenance of O2 saturations>88%;Exhibits proper breathing techniques, such as pursed lip breathing or other method taught during program session;Compliance with respiratory medication             Initial Exercise Prescription:  Initial Exercise Prescription - 03/08/23 1400       Date of Initial Exercise RX and Referring Provider   Date 03/08/23    Referring Provider Dr. Dietrich Pates    Expected Discharge Date 07/11/23      Treadmill   MPH 1.6    Grade 0    Minutes 17      NuStep    Level 1    SPM 80    Minutes 22      Prescription Details   Frequency (times per week) 2  Duration Progress to 30 minutes of continuous aerobic without signs/symptoms of physical distress      Intensity   THRR 40-80% of Max Heartrate 60-119    Ratings of Perceived Exertion 11-13    Perceived Dyspnea 0-4      Resistance Training   Training Prescription Yes    Weight 5    Reps 10-15             Perform Capillary Blood Glucose checks as needed.  Exercise Prescription Changes:   Exercise Prescription Changes     Row Name 03/12/23 1500 03/26/23 1500 04/04/23 1500         Response to Exercise   Blood Pressure (Admit) 130/72 112/78 126/60     Blood Pressure (Exercise) 170/78 140/70 124/68     Blood Pressure (Exit) 130/64 120/60 126/50     Heart Rate (Admit) 88 bpm 80 bpm 66 bpm     Heart Rate (Exercise) 100 bpm 102 bpm 93 bpm     Heart Rate (Exit) 84 bpm 86 bpm 68 bpm     Oxygen Saturation (Admit) 94 % 96 % 94 %     Oxygen Saturation (Exercise) 95 % 95 % 95 %     Oxygen Saturation (Exit) 95 % 95 % 94 %     Rating of Perceived Exertion (Exercise) 11 9 9      Perceived Dyspnea (Exercise) 12 9 9      Duration Continue with 30 min of aerobic exercise without signs/symptoms of physical distress. Continue with 30 min of aerobic exercise without signs/symptoms of physical distress. Continue with 30 min of aerobic exercise without signs/symptoms of physical distress.     Intensity THRR unchanged THRR unchanged THRR unchanged       Progression   Progression Continue to progress workloads to maintain intensity without signs/symptoms of physical distress. Continue to progress workloads to maintain intensity without signs/symptoms of physical distress. Continue to progress workloads to maintain intensity without signs/symptoms of physical distress.       Resistance Training   Training Prescription Yes Yes Yes     Weight 3 5 5      Reps 10-15 10-15 10-15     Time 10 Minutes 10  Minutes 10 Minutes       Treadmill   MPH 1.3 1.5 1.5     Grade 0 0 0     Minutes 17 17 17      METs 2 2.15 2.15       NuStep   Level 1 2 2      SPM 78 85 76     Minutes 22 22 22      METs 1.6 1.6 1.6              Exercise Comments:   Exercise Goals and Review:   Exercise Goals     Row Name 03/08/23 1403 03/26/23 1248 04/23/23 1519         Exercise Goals   Increase Physical Activity Yes Yes Yes     Intervention Provide advice, education, support and counseling about physical activity/exercise needs.;Develop an individualized exercise prescription for aerobic and resistive training based on initial evaluation findings, risk stratification, comorbidities and participant's personal goals. Provide advice, education, support and counseling about physical activity/exercise needs.;Develop an individualized exercise prescription for aerobic and resistive training based on initial evaluation findings, risk stratification, comorbidities and participant's personal goals. Provide advice, education, support and counseling about physical activity/exercise needs.;Develop an individualized exercise prescription for aerobic and resistive training based on  initial evaluation findings, risk stratification, comorbidities and participant's personal goals.     Expected Outcomes Short Term: Attend rehab on a regular basis to increase amount of physical activity.;Long Term: Add in home exercise to make exercise part of routine and to increase amount of physical activity.;Long Term: Exercising regularly at least 3-5 days a week. Short Term: Attend rehab on a regular basis to increase amount of physical activity.;Long Term: Add in home exercise to make exercise part of routine and to increase amount of physical activity.;Long Term: Exercising regularly at least 3-5 days a week. Short Term: Attend rehab on a regular basis to increase amount of physical activity.;Long Term: Add in home exercise to make exercise  part of routine and to increase amount of physical activity.;Long Term: Exercising regularly at least 3-5 days a week.     Increase Strength and Stamina Yes Yes Yes     Intervention Provide advice, education, support and counseling about physical activity/exercise needs.;Develop an individualized exercise prescription for aerobic and resistive training based on initial evaluation findings, risk stratification, comorbidities and participant's personal goals. Provide advice, education, support and counseling about physical activity/exercise needs.;Develop an individualized exercise prescription for aerobic and resistive training based on initial evaluation findings, risk stratification, comorbidities and participant's personal goals. Provide advice, education, support and counseling about physical activity/exercise needs.;Develop an individualized exercise prescription for aerobic and resistive training based on initial evaluation findings, risk stratification, comorbidities and participant's personal goals.     Expected Outcomes Short Term: Increase workloads from initial exercise prescription for resistance, speed, and METs.;Short Term: Perform resistance training exercises routinely during rehab and add in resistance training at home;Long Term: Improve cardiorespiratory fitness, muscular endurance and strength as measured by increased METs and functional capacity ( ) Short Term: Increase workloads from initial exercise prescription for resistance, speed, and METs.;Short Term: Perform resistance training exercises routinely during rehab and add in resistance training at home;Long Term: Improve cardiorespiratory fitness, muscular endurance and strength as measured by increased METs and functional capacity ( ) Short Term: Increase workloads from initial exercise prescription for resistance, speed, and METs.;Short Term: Perform resistance training exercises routinely during rehab and add in resistance training  at home;Long Term: Improve cardiorespiratory fitness, muscular endurance and strength as measured by increased METs and functional capacity ( )     Able to understand and use rate of perceived exertion (RPE) scale Yes Yes Yes     Intervention Provide education and explanation on how to use RPE scale Provide education and explanation on how to use RPE scale Provide education and explanation on how to use RPE scale     Expected Outcomes Short Term: Able to use RPE daily in rehab to express subjective intensity level;Long Term:  Able to use RPE to guide intensity level when exercising independently Short Term: Able to use RPE daily in rehab to express subjective intensity level;Long Term:  Able to use RPE to guide intensity level when exercising independently Short Term: Able to use RPE daily in rehab to express subjective intensity level;Long Term:  Able to use RPE to guide intensity level when exercising independently     Able to understand and use Dyspnea scale Yes Yes Yes     Intervention Provide education and explanation on how to use Dyspnea scale Provide education and explanation on how to use Dyspnea scale Provide education and explanation on how to use Dyspnea scale     Expected Outcomes Short Term: Able to use Dyspnea scale daily in rehab to express subjective sense  of shortness of breath during exertion;Long Term: Able to use Dyspnea scale to guide intensity level when exercising independently Short Term: Able to use Dyspnea scale daily in rehab to express subjective sense of shortness of breath during exertion;Long Term: Able to use Dyspnea scale to guide intensity level when exercising independently Short Term: Able to use Dyspnea scale daily in rehab to express subjective sense of shortness of breath during exertion;Long Term: Able to use Dyspnea scale to guide intensity level when exercising independently     Knowledge and understanding of Target Heart Rate Range (THRR) Yes Yes Yes      Intervention Provide education and explanation of THRR including how the numbers were predicted and where they are located for reference Provide education and explanation of THRR including how the numbers were predicted and where they are located for reference Provide education and explanation of THRR including how the numbers were predicted and where they are located for reference     Expected Outcomes Short Term: Able to state/look up THRR;Short Term: Able to use daily as guideline for intensity in rehab;Long Term: Able to use THRR to govern intensity when exercising independently Short Term: Able to state/look up THRR;Short Term: Able to use daily as guideline for intensity in rehab;Long Term: Able to use THRR to govern intensity when exercising independently Short Term: Able to state/look up THRR;Short Term: Able to use daily as guideline for intensity in rehab;Long Term: Able to use THRR to govern intensity when exercising independently     Able to check pulse independently -- Yes --     Understanding of Exercise Prescription Yes Yes Yes     Intervention Provide education, explanation, and written materials on patient's individual exercise prescription Provide education, explanation, and written materials on patient's individual exercise prescription Provide education, explanation, and written materials on patient's individual exercise prescription     Expected Outcomes Short Term: Able to explain program exercise prescription;Long Term: Able to explain home exercise prescription to exercise independently Short Term: Able to explain program exercise prescription;Long Term: Able to explain home exercise prescription to exercise independently Short Term: Able to explain program exercise prescription;Long Term: Able to explain home exercise prescription to exercise independently              Exercise Goals Re-Evaluation :  Exercise Goals Re-Evaluation     Row Name 03/26/23 1249 04/23/23 1519            Exercise Goal Re-Evaluation   Exercise Goals Review Increase Physical Activity;Increase Strength and Stamina;Able to understand and use rate of perceived exertion (RPE) scale;Able to understand and use Dyspnea scale;Knowledge and understanding of Target Heart Rate Range (THRR);Understanding of Exercise Prescription Increase Physical Activity;Increase Strength and Stamina;Able to understand and use rate of perceived exertion (RPE) scale;Able to understand and use Dyspnea scale;Knowledge and understanding of Target Heart Rate Range (THRR);Understanding of Exercise Prescription      Comments Pt has completed 4 sessions of pulmoanry rehab. He is tolerating exercise and progressing his workloads. He is currently exercising at 2.15 METs on the treadmill. Will continue to monitor and progress as able. Pt has completed 9 sessions of pulmonary rehab. He continues to be motivated and in progressing in his workloads. He is currently exercising at 2.15 METs on the treadmill. Will continue to monitor and progress as able .      Expected Outcomes Through exercising at rehab and a home exercise plan, patient will achieve his goals. Through exercising at rehab and a home  exercise plan, patient will achieve his goals.               Discharge Exercise Prescription (Final Exercise Prescription Changes):  Exercise Prescription Changes - 04/04/23 1500       Response to Exercise   Blood Pressure (Admit) 126/60    Blood Pressure (Exercise) 124/68    Blood Pressure (Exit) 126/50    Heart Rate (Admit) 66 bpm    Heart Rate (Exercise) 93 bpm    Heart Rate (Exit) 68 bpm    Oxygen Saturation (Admit) 94 %    Oxygen Saturation (Exercise) 95 %    Oxygen Saturation (Exit) 94 %    Rating of Perceived Exertion (Exercise) 9    Perceived Dyspnea (Exercise) 9    Duration Continue with 30 min of aerobic exercise without signs/symptoms of physical distress.    Intensity THRR unchanged      Progression   Progression  Continue to progress workloads to maintain intensity without signs/symptoms of physical distress.      Resistance Training   Training Prescription Yes    Weight 5    Reps 10-15    Time 10 Minutes      Treadmill   MPH 1.5    Grade 0    Minutes 17    METs 2.15      NuStep   Level 2    SPM 76    Minutes 22    METs 1.6             Nutrition:  Target Goals: Understanding of nutrition guidelines, daily intake of sodium 1500mg , cholesterol 200mg , calories 30% from fat and 7% or less from saturated fats, daily to have 5 or more servings of fruits and vegetables.  Biometrics:  Pre Biometrics - 03/08/23 1404       Pre Biometrics   Height 5\' 10"  (1.778 m)    Weight 219 lb 5.7 oz (99.5 kg)    Waist Circumference 46.5 inches    Hip Circumference 44.5 inches    Waist to Hip Ratio 1.04 %    BMI (Calculated) 31.47    Triceps Skinfold 15 mm    % Body Fat 31.5 %    Grip Strength 39.8 kg    Flexibility 0 in    Single Leg Stand 8 seconds              Nutrition Therapy Plan and Nutrition Goals:  Nutrition Therapy & Goals - 04/15/23 1427       Nutrition Therapy   RD appointment deferred Yes      Personal Nutrition Goals   Comments We provide educational sessions on heart healthy nutrition with handouts.      Intervention Plan   Intervention Nutrition handout(s) given to patient.    Expected Outcomes Short Term Goal: Understand basic principles of dietary content, such as calories, fat, sodium, cholesterol and nutrients.             Nutrition Assessments:  Nutrition Assessments - 03/08/23 1323       MEDFICTS Scores   Pre Score 15            MEDIFICTS Score Key: ?70 Need to make dietary changes  40-70 Heart Healthy Diet ? 40 Therapeutic Level Cholesterol Diet   Picture Your Plate Scores: <16 Unhealthy dietary pattern with much room for improvement. 41-50 Dietary pattern unlikely to meet recommendations for good health and room for  improvement. 51-60 More healthful dietary pattern, with some room for improvement.  >  60 Healthy dietary pattern, although there may be some specific behaviors that could be improved.    Nutrition Goals Re-Evaluation:   Nutrition Goals Discharge (Final Nutrition Goals Re-Evaluation):   Psychosocial: Target Goals: Acknowledge presence or absence of significant depression and/or stress, maximize coping skills, provide positive support system. Participant is able to verbalize types and ability to use techniques and skills needed for reducing stress and depression.  Initial Review & Psychosocial Screening:  Initial Psych Review & Screening - 03/08/23 1327       Initial Review   Current issues with None Identified      Family Dynamics   Good Support System? Yes      Barriers   Psychosocial barriers to participate in program There are no identifiable barriers or psychosocial needs.;The patient should benefit from training in stress management and relaxation.      Screening Interventions   Interventions Encouraged to exercise;To provide support and resources with identified psychosocial needs    Expected Outcomes Short Term goal: Identification and review with participant of any Quality of Life or Depression concerns found by scoring the questionnaire.;Short Term goal: Utilizing psychosocial counselor, staff and physician to assist with identification of specific Stressors or current issues interfering with healing process. Setting desired goal for each stressor or current issue identified.             Quality of Life Scores:  Quality of Life - 03/08/23 1345       Quality of Life   Select Quality of Life      Quality of Life Scores   Health/Function Pre 21.38 %    Socioeconomic Pre 29 %    Psych/Spiritual Pre 30 %    Family Pre 27.6 %    GLOBAL Pre 25.41 %            Scores of 19 and below usually indicate a poorer quality of life in these areas.  A difference of  2-3  points is a clinically meaningful difference.  A difference of 2-3 points in the total score of the Quality of Life Index has been associated with significant improvement in overall quality of life, self-image, physical symptoms, and general health in studies assessing change in quality of life.   PHQ-9: Review Flowsheet  More data exists      04/30/2023 04/03/2023 03/08/2023 01/18/2023 10/21/2020  Depression screen PHQ 2/9  Decreased Interest   0 0 0  Down, Depressed, Hopeless   0 0 0  PHQ - 2 Score   0 0 0  Altered sleeping   1 3 0  Tired, decreased energy   1 3 0  Change in appetite   0 0 0  Feeling bad or failure about yourself    0 0 0  Trouble concentrating   0 0 0  Moving slowly or fidgety/restless   0 0 0  Suicidal thoughts  - 0 0 0  PHQ-9 Score   2 6 0  Difficult doing work/chores - - Not difficult at all - Not difficult at all    Details       Information is confidential and restricted. Go to Review Flowsheets to unlock data.        Interpretation of Total Score  Total Score Depression Severity:  1-4 = Minimal depression, 5-9 = Mild depression, 10-14 = Moderate depression, 15-19 = Moderately severe depression, 20-27 = Severe depression   Psychosocial Evaluation and Intervention:  Psychosocial Evaluation - 03/08/23 1352  Psychosocial Evaluation & Interventions   Interventions Stress management education;Relaxation education;Encouraged to exercise with the program and follow exercise prescription    Comments Patient has no psychosocial barriers or issues identified at his orientation visit. His PHQ-9 score was 2 due to lack of energy and trouble staying alseep at times. He denies any depression or anxiety. He has participated in cardiac rehab with [place] and is very familiar with our staff and the program. He is a retired Occupational hygienist. He has a great support system with his wife, 2 children and several grandchildren and he has a brother and several friends that also support him.  He is very motivated to get back into an exercise routine and is ready to start the program.    Expected Outcomes Patient will have no psychosocial issues identified.    Continue Psychosocial Services  No Follow up required             Psychosocial Re-Evaluation:  Psychosocial Re-Evaluation     Row Name 03/19/23 1313 04/15/23 1427           Psychosocial Re-Evaluation   Current issues with None Identified None Identified      Comments Patient is new to PR program attending 2 session.  He was referred to PR by Dr. Tenny Craw with Chronic heart failure.  Patient initial QOL score is 25.41% and his PHQ-9 score is 2.  He is very pleasant and interactive with class and staff.  We will continue to monitor his progress as he works toward meeting these goals. Patient has completed 7 sessions.  He continues to have no psychosocial barriers identified. He sees a Veterinary surgeon routinely for adjustment disorder and depression.  He seems to enjoy the sessions. We will continue to monitor his progress.      Expected Outcomes Patient will have no psychological issues identified at discharge Patient will have no psychological issues identified.      Interventions Stress management education;Relaxation education;Encouraged to attend Pulmonary Rehabilitation for the exercise Stress management education;Relaxation education;Encouraged to attend Pulmonary Rehabilitation for the exercise      Continue Psychosocial Services  No Follow up required No Follow up required               Psychosocial Discharge (Final Psychosocial Re-Evaluation):  Psychosocial Re-Evaluation - 04/15/23 1427       Psychosocial Re-Evaluation   Current issues with None Identified    Comments Patient has completed 7 sessions.  He continues to have no psychosocial barriers identified. He sees a Veterinary surgeon routinely for adjustment disorder and depression.  He seems to enjoy the sessions. We will continue to monitor his progress.    Expected  Outcomes Patient will have no psychological issues identified.    Interventions Stress management education;Relaxation education;Encouraged to attend Pulmonary Rehabilitation for the exercise    Continue Psychosocial Services  No Follow up required              Education: Education Goals: Education classes will be provided on a weekly basis, covering required topics. Participant will state understanding/return demonstration of topics presented.  Learning Barriers/Preferences:  Learning Barriers/Preferences - 03/08/23 1324       Learning Barriers/Preferences   Learning Barriers None    Learning Preferences Written Material             Education Topics: How Lungs Work and Diseases: - Discuss the anatomy of the lungs and diseases that can affect the lungs, such as COPD.   Exercise: -Discuss the importance of exercise,  FITT principles of exercise, normal and abnormal responses to exercise, and how to exercise safely.   Environmental Irritants: -Discuss types of environmental irritants and how to limit exposure to environmental irritants. Flowsheet Row PULMONARY REHAB OTHER RESPIRATORY from 04/25/2023 in [place] [facility]  Date 04/18/23  Educator handout       Meds/Inhalers and oxygen: - Discuss respiratory medications, definition of an inhaler and oxygen, and the proper way to use an inhaler and oxygen. Flowsheet Row PULMONARY REHAB OTHER RESPIRATORY from 04/25/2023 in [place] [facility]  Date 04/25/23  Educator handout       Energy Saving Techniques: - Discuss methods to conserve energy and decrease shortness of breath when performing activities of daily living.    Bronchial Hygiene / Breathing Techniques: - Discuss breathing mechanics, pursed-lip breathing technique,  proper posture, effective ways to clear airways, and other functional breathing techniques   Cleaning Equipment: - Provides group verbal and written instruction  about the health risks of elevated stress, cause of high stress, and healthy ways to reduce stress.   Nutrition I: Fats: - Discuss the types of cholesterol, what cholesterol does to the body, and how cholesterol levels can be controlled. Flowsheet Row CARDIAC REHAB PHASE II EXERCISE from 10/05/2020 in [place] [facility]  Date 08/31/20  Educator [facility]  Instruction Review Code 2- Demonstrated Understanding       Nutrition II: Labels: -Discuss the different components of food labels and how to read food labels. Flowsheet Row CARDIAC REHAB PHASE II EXERCISE from 10/05/2020 in [place] [facility]  Date 09/07/20  Educator [facility]  Instruction Review Code 2- Demonstrated Understanding       Respiratory Infections: - Discuss the signs and symptoms of respiratory infections, ways to prevent respiratory infections, and the importance of seeking medical treatment when having a respiratory infection. Flowsheet Row PULMONARY REHAB OTHER RESPIRATORY from 04/25/2023 in [place] [facility]  Date 03/14/23  Educator handout       Stress I: Signs and Symptoms: - Discuss the causes of stress, how stress may lead to anxiety and depression, and ways to limit stress. Flowsheet Row PULMONARY REHAB OTHER RESPIRATORY from 04/25/2023 in [place] [facility]  Date 03/21/23  Educator handout       Stress II: Relaxation: -Discuss relaxation techniques to limit stress.   Oxygen for Home/Travel: - Discuss how to prepare for travel when on oxygen and proper ways to transport and store oxygen to ensure safety. Flowsheet Row PULMONARY REHAB OTHER RESPIRATORY from 04/25/2023 in [place] [facility]  Date 04/04/23  Educator handout       Knowledge Questionnaire Score:  Knowledge Questionnaire Score - 03/08/23 1324       Knowledge Questionnaire Score   Pre Score 14/18             Core Components/Risk Factors/Patient Goals at  Admission:  Personal Goals and Risk Factors at Admission - 03/08/23 1325       Core Components/Risk Factors/Patient Goals on Admission    Weight Management Obesity    Improve shortness of breath with ADL's Yes    Intervention Provide education, individualized exercise plan and daily activity instruction to help decrease symptoms of SOB with activities of daily living.    Expected Outcomes Short Term: Improve cardiorespiratory fitness to achieve a reduction of symptoms when performing ADLs;Long Term: Be able to perform more ADLs without symptoms or delay the onset of symptoms    Heart Failure Yes    Intervention  Provide a combined exercise and nutrition program that is supplemented with education, support and counseling about heart failure. Directed toward relieving symptoms such as shortness of breath, decreased exercise tolerance, and extremity edema.    Expected Outcomes Short term: Attendance in program 2-3 days a week with increased exercise capacity. Reported lower sodium intake. Reported increased fruit and vegetable intake. Reports medication compliance.;Long term: Adoption of self-care skills and reduction of barriers for early signs and symptoms recognition and intervention leading to self-care maintenance.    Hypertension Yes    Intervention Provide education on lifestyle modifcations including regular physical activity/exercise, weight management, moderate sodium restriction and increased consumption of fresh fruit, vegetables, and low fat dairy, alcohol moderation, and smoking cessation.;Monitor prescription use compliance.    Expected Outcomes Short Term: Continued assessment and intervention until BP is < 140/85mm HG in hypertensive participants. < 130/100mm HG in hypertensive participants with diabetes, heart failure or chronic kidney disease.;Long Term: Maintenance of blood pressure at goal levels.    Personal Goal Other Yes    Personal Goal Patient wants to improve his balance and  wallking ability and decrease his SOB with activity.    Intervention Patient will attend PR 3 days/week with exercise and education.    Expected Outcomes Patient will complete the program meeting both personal and program goals.             Core Components/Risk Factors/Patient Goals Review:   Goals and Risk Factor Review     Row Name 03/19/23 1323 04/15/23 1429           Core Components/Risk Factors/Patient Goals Review   Personal Goals Review Develop more efficient breathing techniques such as purse lipped breathing and diaphragmatic breathing and practicing self-pacing with activity.;Improve shortness of breath with ADL's;Heart Failure Develop more efficient breathing techniques such as purse lipped breathing and diaphragmatic breathing and practicing self-pacing with activity.;Improve shortness of breath with ADL's;Heart Failure      Review Patient is new to PR program, attending 2 sessions.  He was referred to PR via Dr. Sherene Sires with chronic heart failure.  His personal goals is to imporve his balance and walking ability and decrease SOB with activity.  He tolerates exercise well on treadmill and nu-step with his O2 sats at 94%-95% and no supplemental oxygen needed at this time.  We will encourage him to increase his workload as he progresses in the program.   We will continue to monitor his progress as he works toward meeting these goals. Patient has completed 7 sessions. He is doing well in the program with consistent attendance. He is exercising on RA with his O2 sats averaging 96%.  His personal goals is to imporve his balance and walking ability and decrease SOB with activity. We will continue to monitor his progress as he works toward meeting these goals.      Expected Outcomes Patient will complete the program meeting both program and personal goals at discharge. Patient will complete the program meeting both personal and program goals.               Core Components/Risk  Factors/Patient Goals at Discharge (Final Review):   Goals and Risk Factor Review - 04/15/23 1429       Core Components/Risk Factors/Patient Goals Review   Personal Goals Review Develop more efficient breathing techniques such as purse lipped breathing and diaphragmatic breathing and practicing self-pacing with activity.;Improve shortness of breath with ADL's;Heart Failure    Review Patient has completed 7 sessions. He is  doing well in the program with consistent attendance. He is exercising on RA with his O2 sats averaging 96%.  His personal goals is to imporve his balance and walking ability and decrease SOB with activity. We will continue to monitor his progress as he works toward meeting these goals.    Expected Outcomes Patient will complete the program meeting both personal and program goals.             ITP Comments:  ITP Comments     Row Name 04/24/23 1614 05/08/23 0741 05/16/23 1529 05/22/23 1520     ITP Comments 30 day review completed. ITP sent to Dr.Jehanzeb Memon, Medical Director of  Pulmonary Rehab. Continue with ITP unless changes are made by physician. Pt tested positive for COVID 05/04/23 will return 10days after postiive test to rehab without symptoms JP called to let us know that he tested postive for COVID again.  He was told by his doctor that this could happen. He also continues to have a cough.  He was encouraged to stay home until symptoms resolve.  He plans to check in next week.  Since being out, we have not reviewed goals this round. 30 day review completed. ITP sent to Dr.Jehanzeb Memon, Medical Director of  Pulmonary Rehab. Continue with ITP unless changes are made by physician.             Comments: 30 day review

## 2023-05-23 ENCOUNTER — Encounter (HOSPITAL_COMMUNITY): Payer: HMO

## 2023-05-27 ENCOUNTER — Inpatient Hospital Stay: Admission: RE | Admit: 2023-05-27 | Payer: HMO | Source: Ambulatory Visit

## 2023-05-27 ENCOUNTER — Other Ambulatory Visit: Payer: HMO

## 2023-05-28 ENCOUNTER — Encounter (HOSPITAL_COMMUNITY)
Admission: RE | Admit: 2023-05-28 | Discharge: 2023-05-28 | Disposition: A | Discharge status: Home / Self Care | Payer: HMO | Source: Ambulatory Visit | Attending: Internal Medicine | Admitting: Internal Medicine

## 2023-05-28 ENCOUNTER — Ambulatory Visit: Payer: HMO | Admitting: Behavioral Health

## 2023-05-28 ENCOUNTER — Encounter (HOSPITAL_COMMUNITY): Payer: HMO

## 2023-05-28 DIAGNOSIS — I509 Heart failure, unspecified: Secondary | ICD-10-CM | POA: Diagnosis not present

## 2023-05-28 NOTE — Progress Notes (Signed)
Daily Session Note  Patient Details  Name: Jeremy Lopez MRN: [id number] Date of Birth: 03-02-1951 Referring Provider:   Flowsheet Row PULMONARY REHAB OTHER RESP ORIENTATION from 03/08/2023 in [facility]  Referring Provider Dr. Dietrich Pates       Encounter Date: 05/28/2023  Check In:  Session Check In - 05/28/23 1446       Check-In   Supervising physician immediately available to respond to emergencies See telemetry face sheet for immediately available MD    Location [facility]    Staff Present Ross Ludwig, BS, Exercise Physiologist;Jessica Juanetta Gosling, MA, RCEP, CCRP, Harolyn Rutherford, RN, BSN;Phyllis Billingsley, RN    Virtual Visit No    Medication changes reported     No    Fall or balance concerns reported    Yes    Comments Patient reports poor balance and exhibits shuffling gait.    Tobacco Cessation No Change    Warm-up and Cool-down Performed on first and last piece of equipment    Resistance Training Performed Yes    VAD Patient? No      Pain Assessment   Currently in Pain? No/denies             Capillary Blood Glucose: No results found for this or any previous visit (from the past 24 hour(s)).    Social History   Tobacco Use  Smoking Status Former   Current packs/day: 0.00   Types: Cigarettes   Start date: 10/25/1966   Quit date: 10/25/1998   Years since quitting: 24.6  Smokeless Tobacco Never    Goals Met:  Proper associated with RPD/PD & O2 Sat Independence with exercise equipment Using PLB without cueing & demonstrates good technique Exercise tolerated well Queuing for purse lip breathing No report of concerns or symptoms today Strength training completed today  Goals Unmet:  Not Applicable  Comments: Pt able to follow exercise prescription today without complaint.  Will continue to monitor for progression.    Dr. Erick Blinks is Medical Director for [facility] Pulmonary [facility]hab.

## 2023-05-29 ENCOUNTER — Encounter: Payer: Self-pay | Admitting: Physician Assistant

## 2023-05-29 NOTE — Progress Notes (Signed)
Cardiology Office Note    Date:  05/31/2023  ID:  Jeremy Lopez, DOB 1951-05-07, MRN [id number] PCP:  [name], MD  Cardiologist:  Dietrich Pates, MD  Electrophysiologist:  None   Chief Complaint: f/u CAD, suspected HFpEF  History of Present Illness: .    Jeremy Lopez is a 72 y.o. male with visit-pertinent history of CAD s/p DES to prox LAD in 2010, DES to [facility] 04/2020, HTN, HLD, suspected HFpEF, possible CKD stage 2, remote lung CA (tx with radiation/chemo), habitual ETOH intake, former tobacco abuse, prior dependent edema (no longer on amlodipine), prior occasional PVCs who is seen for follow-up.    His first stent in 2010 was prompted by chest pain that felt like elephant sitting on his chest. In 2021 he developed DOE and chest tightness. Stress test was nonischemic but cath was pursued due to persistent symptoms resulting in PCI. EF was normal at that time. Of note he also had had a CTA around that time showing radiation fibrosis. He also follows with pulmonology. I met him in 2023 when he was reporting progressive DOE. Labs were unrevealing. He followed up with pulmonology who felt recent CT had shown show stability, +emphysema + radiation scarring. Concern for decreasing excess ETOH was also previously discussed. Echo 06/2022 showed EF 60-65%, G1DD, normal RV. Cardiac cath was pursued 06/2022 showing nonobstructive disease in the RCA and otherwise patent stents, mildly elevated LVEDP. Dr. Tenny Craw recommended switching diltiazem to [place] and added Jardiance. (He had not recalled being on diltiazem for any sustained arrhythmias though had a prior h/o PVCs.) At f/u 07/2022 he had not been taking the [place], had stopped the [place], and had gone back to diltiazem due to HTN. He was supposed to have gotten a BMET after our OV 07/2022 to guide med titration but not completed at that time. When seen again in follow-up 11/2022, his BP was still elevated and he was back on diltiazem. He was also  not taking Plavix for unclear reasons. Dr. Tenny Craw recommended to double his [name] and added back Plavix with recommendation for patient to relay BP readings via MyChart. Do not see this received.  He returns for follow-up feeling well from a cardiac standpoint. Denies CP, dyspnea, palpitations. He has chronic BLE sockline edema which he reports is near baseline. Initial BP 166/78 with recheck 168/78. He reports he has been out of his blood pressure medicines for several days. He denies any specific barriers to care. He just needs to request the pharmacy refill them. He reports his BP at home yesterday was 126/65. He reports having a brain MRI scheduled for tomorrow as he's having some issues with his gait - feels like his body starts walking/leaning forward before his legs catch up. Mild parkinsonism noted at recent neuro visit.   Labwork independently reviewed: 01/2023 CBC wnl, CMET OK Cr 1.18, LFTS wnl, K ULN 5.2, trig 189, LDL 76, A1c 5.8, TSH/FT4 wnl  ROS: .    Please see the history of present illness.  All other systems are reviewed and otherwise negative.  Studies Reviewed: Marland Kitchen    EKG:  EKG is ordered today, personally reviewed, demonstrating NSR, nonspecific TWI avL, unchanged from prior  CV Studies: Cardiac studies reviewed are outlined and summarized above. Otherwise please see EMR for full report.   Current Reported Medications:.    Current Meds  Medication Sig   albuterol (VENTOLIN HFA) 108 (90 Base) MCG/ACT inhaler Inhale 2 puffs into the lungs every 6 (  six) hours as needed for wheezing or shortness of breath.   buPROPion (WELLBUTRIN XL) 150 MG 24 hr tablet Take 1 tablet (150 mg total) by mouth daily.   Cholecalciferol (VITAMIN D) 50 MCG (2000 UT) tablet Take 2,000 Units by mouth daily.   clopidogrel (PLAVIX) 75 MG tablet Take 1 tablet (75 mg total) by mouth daily with breakfast.   diltiazem (DILACOR XR) 240 MG 24 hr capsule Take 1 capsule (240 mg total) by mouth daily.    empagliflozin (JARDIANCE) 10 MG TABS tablet Take 1 tablet (10 mg total) by mouth daily before breakfast.   Fluticasone-Umeclidin-Vilant (TRELEGY ELLIPTA) 100-62.5-25 MCG/ACT AEPB Inhale 1 puff into the lungs daily.   gabapentin (NEURONTIN) 300 MG capsule Take 1 capsule (300 mg total) by mouth at bedtime.   metoprolol succinate (TOPROL-XL) 100 MG 24 hr tablet TAKE ONE TABLET (100MG  TOTAL) BY MOUTH DAILY. TAKE WITH OR IMMEDIATELY FOLLOWING A MEAL.   nitroGLYCERIN (NITROSTAT) 0.4 MG SL tablet Place 1 tablet (0.4 mg total) under the tongue every 5 (five) minutes as needed for chest pain.   prochlorperazine (COMPAZINE) 5 MG tablet Take 1 tablet (5 mg total) by mouth every 6 (six) hours as needed for nausea or vomiting.   rosuvastatin (CRESTOR) 40 MG tablet TAKE ONE (1) TABLET BY MOUTH EVERY DAY   sacubitril-valsartan (ENTRESTO) 49-51 MG Take 1 tablet by mouth 2 (two) times daily.   Spacer/Aero-Holding Rudean Curt Use with inhaler   traZODone (DESYREL) 50 MG tablet Take 1 tablet (50 mg total) by mouth at bedtime.   Current Facility-Administered Medications for the 05/31/23 encounter (Office Visit) with Laurann Montana, PA-C  Medication   sodium chloride flush (NS) 0.9 % injection 3 mL    Physical Exam:    VS:  BP (!) 166/78   Pulse 78   Ht 5\' 10"  (1.778 m)   Wt 220 lb (99.8 kg)   SpO2 96%   BMI 31.57 kg/m    Wt Readings from Last 3 Encounters:  05/31/23 220 lb (99.8 kg)  05/13/23 218 lb 12.8 oz (99.2 kg)  03/12/23 219 lb 5.7 oz (99.5 kg)    GEN: Well nourished, well developed in no acute distress NECK: No JVD; No carotid bruits CARDIAC: RRR, no murmurs, rubs, gallops RESPIRATORY:  Clear to auscultation without rales, wheezing or rhonchi  ABDOMEN: Soft, non-tender, non-distended EXTREMITIES:  mild BLE sockline edema; No acute deformity   Asessement and Plan:.    1. CAD, HLD - overall stable from this standpoint. He affirms he has been adherent to his Plavix- has been maintained on  Plavix monotherapy without ASA per Dr. Tenny Craw. Would recommend continuing metoprolol and rosuvastatin. Last LDL 01/2023 was slightly above goal at  72 (goal <70) but in the setting of some lapse in medication adherence, it does not make sense to acutely recheck today. Encouraged patient to get back on intended regimen. Lipids have otherwise been followed by PCP.   2. Essential HTN - uncontrolled today but in the setting of lapse in medication adherence. Patient denies needing any active refills form our end, just that he needs to request them from the pharmacy to be refilled. Encouraged him to get back on these ASAP. Last BMET 01/2023 showed K at upper limits of normal. Will recheck this middle of next week once he's been back on intended regimen for several days. I also asked him to be following his BP at home and notify if still running >130 after 1 week of being back  on meds. We'll plan early follow-up in a few weeks to keep tabs on this as well.  HYPERTENSION CONTROL Vitals:   05/31/23 1337 05/31/23 1407  BP: (!) 166/78 (!) 168/72    The patient's blood pressure is elevated above target today.  In order to address the patient's elevated BP: Blood pressure will be monitored at home to determine if medication changes need to be made. Resuming intended home regimen.     3. Chronic HFpEF - mild edema noted on exam in the context of medication nonadherence. Otherwise no major accelerating symptoms. Resume intended regimen and follow.  4. H/o PVCs - quiescent. Continue metoprolol, diltiazem.     Disposition: F/u with me in 3-4 weeks, other APP OK if no availability.  Signed, Laurann Montana, PA-C

## 2023-05-30 ENCOUNTER — Encounter (HOSPITAL_COMMUNITY): Payer: HMO

## 2023-05-30 ENCOUNTER — Encounter (HOSPITAL_COMMUNITY)
Admission: RE | Admit: 2023-05-30 | Discharge: 2023-05-30 | Disposition: A | Discharge status: Home / Self Care | Payer: HMO | Source: Ambulatory Visit | Attending: Internal Medicine | Admitting: Internal Medicine

## 2023-05-30 DIAGNOSIS — I509 Heart failure, unspecified: Secondary | ICD-10-CM

## 2023-05-30 NOTE — Progress Notes (Signed)
Daily Session Note  Patient Details  Name: Jeremy Lopez MRN: [id number] Date of Birth: 1950-11-06 Referring Provider:   Flowsheet Row PULMONARY REHAB OTHER RESP ORIENTATION from 03/08/2023 in [facility] CARD[facility]AC REHABILITATION  Referring Provider Dr. Dietrich Pates       Encounter Date: 05/30/2023  Check In:  Session Check In - 05/30/23 1425       Check-In   Supervising physician immediately available to respond to emergencies See telemetry face sheet for immediately available ER MD    Location [facility]    Staff Present Ross Ludwig, BS, Exercise Physiologist;Lucah Petta Laural Benes, RN, BSN;Hillary Troutman BSN, RN    Virtual Visit No    Medication changes reported     No    Fall or balance concerns reported    Yes    Comments Patient reports poor balance and exhibits shuffling gait.    Warm-up and Cool-down Performed on first and last piece of equipment    Resistance Training Performed Yes    VAD Patient? No    PAD/SET Patient? No      Pain Assessment   Currently in Pain? No/denies    Pain Score 0-No pain    Multiple Pain Sites No             Capillary Blood Glucose: No results found for this or any previous visit (from the past 24 hour(s)).    Social History   Tobacco Use  Smoking Status Former   Current packs/day: 0.00   Types: Cigarettes   Start date: 10/25/1966   Quit date: 10/25/1998   Years since quitting: 24.6  Smokeless Tobacco Never    Goals Met:  Proper associated with RPD/PD & O2 Sat Independence with exercise equipment Using PLB without cueing & demonstrates good technique Exercise tolerated well No report of concerns or symptoms today Strength training completed today  Goals Unmet:  Not Applicable  Comments: Pt able to follow exercise prescription today without complaint.  Will continue to monitor for progression.    Dr. Erick Blinks is Medical Director for [facility] Pulmonary Rehab.

## 2023-05-31 ENCOUNTER — Encounter: Payer: Self-pay | Admitting: Physician Assistant

## 2023-05-31 ENCOUNTER — Ambulatory Visit: Payer: HMO | Attending: Internal Medicine | Admitting: Physician Assistant

## 2023-05-31 VITALS — BP 168/72 | HR 78 | Ht 70.0 in | Wt 220.0 lb

## 2023-05-31 DIAGNOSIS — E785 Hyperlipidemia, unspecified: Secondary | ICD-10-CM

## 2023-05-31 DIAGNOSIS — I493 Ventricular premature depolarization: Secondary | ICD-10-CM | POA: Diagnosis not present

## 2023-05-31 DIAGNOSIS — I2510 Atherosclerotic heart disease of native coronary artery without angina pectoris: Secondary | ICD-10-CM

## 2023-05-31 DIAGNOSIS — I10 Essential (primary) hypertension: Secondary | ICD-10-CM

## 2023-05-31 DIAGNOSIS — I5032 Chronic diastolic (congestive) heart failure: Secondary | ICD-10-CM | POA: Diagnosis not present

## 2023-05-31 NOTE — Patient Instructions (Signed)
Medication Instructions:  Your physician recommends that you continue on your current medications as directed. Please refer to the Current Medication list given to you today.   Labwork: BMET -middle of next week  Testing/Procedures: None today  Follow-Up: Sept 16 or 17 th with Dayna  Any Other Special Instructions Will Be Listed Below (If Applicable).  If you need a refill on your cardiac medications before your next appointment, please call your pharmacy.

## 2023-05-31 NOTE — Progress Notes (Deleted)
Resume intended regimen

## 2023-06-01 ENCOUNTER — Ambulatory Visit
Admission: RE | Admit: 2023-06-01 | Discharge: 2023-06-01 | Disposition: A | Discharge status: Home / Self Care | Payer: HMO | Source: Ambulatory Visit | Attending: Diagnostic Neuroimaging | Admitting: Diagnostic Neuroimaging

## 2023-06-01 DIAGNOSIS — R269 Unspecified abnormalities of gait and mobility: Secondary | ICD-10-CM | POA: Diagnosis not present

## 2023-06-01 DIAGNOSIS — R270 Ataxia, unspecified: Secondary | ICD-10-CM | POA: Diagnosis not present

## 2023-06-01 DIAGNOSIS — R2689 Other abnormalities of gait and mobility: Secondary | ICD-10-CM | POA: Diagnosis not present

## 2023-06-01 MED ORDER — GADOPICLENOL 0.5 MMOL/ML IV SOLN
10.0000 mL | Freq: Once | INTRAVENOUS | Status: AC | PRN
  Administered 2023-06-01: 10 mL via INTRAVENOUS

## 2023-06-04 ENCOUNTER — Encounter (HOSPITAL_COMMUNITY): Payer: HMO

## 2023-06-04 ENCOUNTER — Encounter (HOSPITAL_COMMUNITY)
Admission: RE | Admit: 2023-06-04 | Discharge: 2023-06-04 | Disposition: A | Discharge status: Home / Self Care | Payer: HMO | Source: Ambulatory Visit | Attending: Internal Medicine | Admitting: Internal Medicine

## 2023-06-04 DIAGNOSIS — I509 Heart failure, unspecified: Secondary | ICD-10-CM

## 2023-06-04 NOTE — Progress Notes (Signed)
Daily Session Note  Patient Details  Name: Jeremy Lopez MRN: [id number] Date of Birth: 1951/10/07 Referring Provider:   Flowsheet Row PULMONARY REHAB OTHER RESP ORIENTATION from 03/08/2023 in [facility] CARDIAC REHABILITATION  Referring Provider Dr. Dietrich Pates       Encounter Date: 06/04/2023  Check In:  Session Check In - 06/04/23 1455       Check-In   Supervising physician immediately available to respond to emergencies See telemetry face sheet for immediately available MD    Location [facility]    Staff Present Ross Ludwig, BS, Exercise Physiologist;Jessica Juanetta Gosling, MA, RCEP, CCRP, Harolyn Rutherford, RN, BSN    Virtual Visit No    Medication changes reported     No    Fall or balance concerns reported    Yes    Tobacco Cessation No Change    Warm-up and Cool-down Performed on first and last piece of equipment    Resistance Training Performed Yes    VAD Patient? No      Pain Assessment   Currently in Pain? No/denies    Pain Score 0-No pain             Capillary Blood Glucose: No results found for this or any previous visit (from the past 24 hour(s)).    Social History   Tobacco Use  Smoking Status Former   Current packs/day: 0.00   Types: Cigarettes   Start date: 10/25/1966   Quit date: 10/25/1998   Years since quitting: 24.6  Smokeless Tobacco Never    Goals Met:  Proper associated with RPD/PD & O2 Sat Independence with exercise equipment Using PLB without cueing & demonstrates good technique Exercise tolerated well Queuing for purse lip breathing No report of concerns or symptoms today Strength training completed today  Goals Unmet:  Not Applicable  Comments: Pt able to follow exercise prescription today without complaint.  Will continue to monitor for progression.    Dr. Erick Blinks is Medical Director for [facility] Pulmonary Rehab.

## 2023-06-05 ENCOUNTER — Ambulatory Visit (HOSPITAL_COMMUNITY): Payer: HMO

## 2023-06-05 ENCOUNTER — Encounter (HOSPITAL_COMMUNITY): Payer: Self-pay

## 2023-06-05 NOTE — Therapy (Deleted)
OUTPATIENT PHYSICAL THERAPY EVALUATION (THORACOLUMBAR)   Patient Name: Jeremy Lopez MRN: [id number] DOB:06-25-1951, 72 y.o., male Today's Date: 06/05/2023  END OF SESSION: ***    Past Medical History:  Diagnosis Date   CAD (coronary artery disease), native coronary artery    DES proximal LAD 07/2009; DES to RCA 04/2020   Chronic heart failure with preserved ejection fraction (HFpEF) (HCC)    Essential hypertension    Habitual alcohol use    Hyperlipidemia    PVC's (premature ventricular contractions)    Small cell lung cancer (HCC)    Right upper lobe 2008; no evidence of disease since treatment ended in 11/2007   Past Surgical History:  Procedure Laterality Date   CORONARY ATHERECTOMY N/A 04/26/2020   Procedure: CORONARY ATHERECTOMY;  Surgeon: Corky Crafts, MD;  Location: [facility] INVASIVE CV LAB;  Service: Cardiovascular;  Laterality: N/A;   CORONARY STENT INTERVENTION N/A 04/26/2020   Procedure: CORONARY STENT INTERVENTION;  Surgeon: Corky Crafts, MD;  Location: [facility] INVASIVE CV LAB;  Service: Cardiovascular;  Laterality: N/A;   CORONARY ULTRASOUND/IVUS N/A 04/26/2020   Procedure: Intravascular Ultrasound/IVUS;  Surgeon: Corky Crafts, MD;  Location: [facility] INVASIVE CV LAB;  Service: Cardiovascular;  Laterality: N/A;   Insertion of left subclavian Port-A-Cath  2008   LEFT HEART CATH AND CORONARY ANGIOGRAPHY N/A 07/03/2022   Procedure: LEFT HEART CATH AND CORONARY ANGIOGRAPHY;  Surgeon: Swazil[place], Peter M, MD;  Location: [facility] INVASIVE CV LAB;  Service: Cardiovascular;  Laterality: N/A;   RIGHT/LEFT HEART CATH AND CORONARY ANGIOGRAPHY N/A 04/26/2020   Procedure: RIGHT/LEFT HEART CATH AND CORONARY ANGIOGRAPHY;  Surgeon: Corky Crafts, MD;  Location: [facility] INVASIVE CV LAB;  Service: Cardiovascular;  Laterality: N/A;   TEMPORARY PACEMAKER N/A 04/26/2020   Procedure: TEMPORARY PACEMAKER;  Surgeon: Corky Crafts, MD;  Location: [facility] INVASIVE CV LAB;  Service: Cardiovascular;   Laterality: N/A;   Video bronchoscopy and video mediastinoscopy  2008   Patient Active Problem List   Diagnosis Date Noted   Prediabetes 02/01/2023   CKD (chronic kidney disease) stage 2, GFR 60-89 ml/min 02/01/2023   Nausea 02/01/2023   Colon cancer screening 02/01/2023   CHF (congestive heart failure) (HCC) 01/23/2023   Numbness and tingling of both lower extremities 01/23/2023   Encounter for general adult medical examination with abnormal findings 01/23/2023   Allergic rhinitis 01/23/2022   Asthmatic bronchitis, mild persistent, uncomplicated 01/23/2022   Upper airway cough syndrome 01/23/2022   Simple chronic bronchitis (HCC) 12/28/2021   Seasonal allergies 12/28/2021   DOE (dyspnea on exertion)    Nosebleed 10/25/2014   Lung cancer (HCC) 02/05/2010   CAD (coronary artery disease), native coronary artery 02/05/2010   Mixed hyperlipidemia 08/16/2009   Essential hypertension 07/08/2009    PCP: [name], MDRef Provider (PCP)   REFERRING PROVIDER:   Suanne Marker, MD    REFERRING DIAG: R26.9 (ICD-10-CM) - Gait difficulty   Rationale for Evaluation and Treatment: {HABREHAB:27488}  THERAPY DIAG:  No diagnosis found.  ONSET DATE: *** --------------------------------------------------------------------------------------------- SUBJECTIVE:  SUBJECTIVE STATEMENT: ***  PERTINENT HISTORY:  CAD, HTN  PAIN:  Are you having pain? {OPRCPAIN:27236}  PRECAUTIONS: {Therapy precautions:24002}  RED FLAGS: {PT Red Flags:29287}   WEIGHT BEARING RESTRICTIONS: {Yes ***/No:24003}  FALLS:  Has patient fallen in last 6 months? {fallsyesno:27318}  LIVING ENVIRONMENT: Lives with: {OPRC lives with:25569::"lives with their family"} Lives in: {Lives in:25570} Stairs:  {opstairs:27293} Has following equipment at home: {Assistive devices:23999}  OCCUPATION: ***  PLOF: {PLOF:24004}  PATIENT GOALS: ***  NEXT MD VISIT: *** --------------------------------------------------------------------------------------------- OBJECTIVE:   DIAGNOSTIC FINDINGS:  MRI 06/01/23 IMPRESSION: This MRI of the brain with and without contrast shows the following: Ventriculomegaly out of proportion to the extent of generalized cortical atrophy.  There is sulcal effacement at the vertex.  The ventriculomegaly could be due to generalized cortical atrophy combined with normal pressure hydrocephalus, increased compared to the MRI from 2019. No significant chronic ischemic changes are noted. Normal enhancement pattern.  No acute findings.Marland Kitchen    PATIENT SURVEYS:  {rehab surveys:24030}  SCREENING FOR RED FLAGS: Bowel or bladder incontinence: {Yes/No:304960894} Spinal tumors: {Yes/No:304960894} Cauda equina syndrome: {Yes/No:304960894} Compression fracture: {Yes/No:304960894} Abdominal aneurysm: {Yes/No:304960894}  COGNITION: Overall cognitive status: {cognition:24006}  POSTURE: {posture:25561}      FUNCTIONAL TESTS:  {Functional tests:24029}   GAIT ANALYSIS: Distance walked: *** Assistive device utilized: {Assistive devices:23999} Level of assistance: {Levels of assistance:24026} Comments: ***  SENSATION: {sensation:27233}   LUMBAR ROM:   AROM eval  Flexion   Extension   Right lateral flexion   Left lateral flexion   Right rotation   Left rotation    (Blank rows = not tested; * = limited by pain)  LOWER EXTREMITY MMT:    Left ankle/hip/knee grossly ***/5 except:  Right ankle/hip/knee grossly ***/5 except:   LOWER EXTREMITY ROM:     Left ankle/hip/knee AROM grossly WFL except:  Right ankle/hip/knee AROM grossly WFL except:   LUMBAR SPECIAL TESTS:  {lumbar special  test:25242}  PALPATION: *** --------------------------------------------------------------------------------------------- TODAY'S TREATMENT:                                                                                                                              DATE: ***   PATIENT EDUCATION:  Education details: *** Person educated: {Person educated:25204} Education method: {Education Method:25205} Education comprehension: {Education Comprehension:25206}  HOME EXERCISE PROGRAM: *** --------------------------------------------------------------------------------------------- ASSESSMENT:  CLINICAL IMPRESSION: Patient is a 72 y.o. y.o. male who was seen today for physical therapy evaluation and treatment for ***. Patient presents to PT with the following objective impairments: {opptimpairments:25111}. These impairments limit the patient in activities such as {activitylimitations:27494}. These impairments also limit the patient in participation such as {participationrestrictions:25113}. The patient will benefit from PT to address the limitations/impairments listed below to return to their prior level of function in the domains of activity and participation.    PERSONAL FACTORS: {Personal factors:25162} are also affecting patient's functional outcome.   REHAB POTENTIAL: {rehabpotential:25112}  CLINICAL DECISION MAKING: {clinical decision making:25114}  EVALUATION COMPLEXITY: {Evaluation complexity:25115}  ---------------------------------------------------------------------------------------------  GOALS: Goals reviewed with patient? {yes/no:20286}  SHORT TERM GOALS: Target date: ***  Patient will be able to complete the Timed Up and Go Test (TUG) within ***seconds to improve gait speed to facilitate safe ambulation around home and community  Baseline: Goal status: INITIAL  2.  Patient will score a *** on the {Functional tests:24029} to demonstrate an improvement in ADL  completion, stair negotiation, household/community ambulation, and self-care Baseline:  Goal status: INITIAL  3. Patient will be independent with a basic stretching/strengthening HEP  Baseline:  Goal status: INITIAL   LONG TERM GOALS: Target date: ***  Patient will be able to complete the Timed Up and Go Test (TUG) within ***seconds to improve gait speed to facilitate safe ambulation around home and community  Baseline:  Goal status: INITIAL  2.  Patient will score a *** on the {Functional tests:24029} to demonstrate an improvement in ADL completion, stair negotiation, household/community ambulation, and self-care Baseline:  Goal status: INITIAL  3.  Patient will be independent with a comprehensive strengthening HEP  Baseline:  Goal status: INITIAL  --------------------------------------------------------------------------------------------- PLAN:  PT FREQUENCY: {rehab frequency:25116}  PT DURATION: {rehab duration:25117}  PLANNED INTERVENTIONS: {rehab planned interventions:25118::"Therapeutic exercises","Therapeutic activity","Neuromuscular re-education","Balance training","Gait training","Patient/Family education","Self Care","Joint mobilization"}.  PLAN FOR NEXT SESSION: Seymour Bars, PT 06/05/2023, 9:33 AM

## 2023-06-06 ENCOUNTER — Encounter (HOSPITAL_COMMUNITY): Payer: HMO

## 2023-06-11 ENCOUNTER — Encounter (HOSPITAL_COMMUNITY)
Admission: RE | Admit: 2023-06-11 | Discharge: 2023-06-11 | Disposition: A | Discharge status: Home / Self Care | Payer: HMO | Source: Ambulatory Visit | Attending: Internal Medicine | Admitting: Internal Medicine

## 2023-06-11 ENCOUNTER — Encounter (HOSPITAL_COMMUNITY): Payer: HMO

## 2023-06-11 DIAGNOSIS — I509 Heart failure, unspecified: Secondary | ICD-10-CM | POA: Diagnosis not present

## 2023-06-11 NOTE — Progress Notes (Signed)
Daily Session Note  Patient Details  Name: YUKIO LEWARK MRN: [id number] Date of Birth: [birth year] Referring Provider:   Flowsheet Row PULMONARY REHAB OTHER RESP ORIENTATION from 03/08/2023 in [facility] CARDIAC REHABILITATION  Referring Provider Dr. Dietrich Pates       Encounter Date: 06/11/2023  Check In:  Session Check In - 06/11/23 1519       Check-In   Supervising physician immediately available to respond to emergencies --    Location --    Staff Present --    Virtual Visit --    Medication changes reported     --    Fall or balance concerns reported    --    Comments --    Tobacco Cessation --    Warm-up and Cool-down --    Resistance Training Performed --    VAD Patient? --      Pain Assessment   Currently in Pain? --             Capillary Blood Glucose: No results found for this or any previous visit (from the past 24 hour(s)).    Social History   Tobacco Use  Smoking Status Former   Current packs/day: 0.00   Types: Cigarettes   Start date: 10/25/1966   Quit date: 10/25/1998   Years since quitting: 24.6  Smokeless Tobacco Never    Goals Met:  Proper associated with RPD/PD & O2 Sat Independence with exercise equipment Using PLB without cueing & demonstrates good technique Exercise tolerated well Queuing for purse lip breathing No report of concerns or symptoms today Strength training completed today  Goals Unmet:  Not Applicable  Comments: Pt able to follow exercise prescription today without complaint.  Will continue to monitor for progression.    Dr. Erick Blinks is Medical Director for [facility] Pulmonary Re[facility].

## 2023-06-13 ENCOUNTER — Encounter: Payer: Self-pay | Admitting: Neurology

## 2023-06-13 ENCOUNTER — Encounter (HOSPITAL_COMMUNITY)
Admission: RE | Admit: 2023-06-13 | Discharge: 2023-06-13 | Disposition: A | Discharge status: Home / Self Care | Payer: HMO | Source: Ambulatory Visit | Attending: Internal Medicine | Admitting: Internal Medicine

## 2023-06-13 ENCOUNTER — Encounter (HOSPITAL_COMMUNITY): Payer: HMO

## 2023-06-13 DIAGNOSIS — G912 (Idiopathic) normal pressure hydrocephalus: Secondary | ICD-10-CM

## 2023-06-13 DIAGNOSIS — I509 Heart failure, unspecified: Secondary | ICD-10-CM | POA: Diagnosis not present

## 2023-06-13 NOTE — Progress Notes (Signed)
Daily Session Note  Patient Details  Name: ASIYAH MELLER MRN: [id number] Date of Birth: [birth year] Referring Provider:   Flowsheet Row PULMONARY REHAB OTHER RESP ORIENTATION from 03/08/2023 in [facility] CARDIAC REHABILITATION  Referring Provider Dr. Dietrich Pates       Encounter Date: 06/13/2023  Check In:  Session Check In - 06/13/23 1430       Check-In   Supervising physician immediately available to respond to emergencies See telemetry face sheet for immediately available ER MD    Location [facility]    Staff Present Rodena Medin, RN, BSN;Jessica [place], MA, RCEP, CCRP, Harolyn Rutherford, RN, BSN    Virtual Visit No    Medication changes reported     No    Fall or balance concerns reported    Yes    Comments Patient reports poor balance and exhibits shuffling gait.    Warm-up and Cool-down Performed on first and last piece of equipment    Resistance Training Performed Yes    VAD Patient? No    PAD/SET Patient? No      Pain Assessment   Currently in Pain? No/denies    Pain Score 0-No pain    Multiple Pain Sites No             Capillary Blood Glucose: No results found for this or any previous visit (from the past 24 hour(s)).    Social History   Tobacco Use  Smoking Status Former   Current packs/day: 0.00   Types: Cigarettes   Start date: 10/25/1966   Quit date: 10/25/1998   Years since quitting: 24.6  Smokeless Tobacco Never    Goals Met:  Proper associated with RPD/PD & O2 Sat Independence with exercise equipment Using PLB without cueing & demonstrates good technique Exercise tolerated well No report of concerns or symptoms today Strength training completed today  Goals Unmet:  Not Applicable  Comments: Pt able to follow exercise prescription today without complaint.  Will continue to monitor for progression.    Dr. Erick Blinks is Medical Director for [facility].

## 2023-06-18 ENCOUNTER — Encounter (HOSPITAL_COMMUNITY): Payer: HMO

## 2023-06-18 ENCOUNTER — Encounter (HOSPITAL_COMMUNITY)
Admission: RE | Admit: 2023-06-18 | Discharge: 2023-06-18 | Disposition: A | Discharge status: Home / Self Care | Payer: HMO | Source: Ambulatory Visit | Attending: Internal Medicine | Admitting: Internal Medicine

## 2023-06-18 DIAGNOSIS — I509 Heart failure, unspecified: Secondary | ICD-10-CM | POA: Diagnosis not present

## 2023-06-18 NOTE — Progress Notes (Signed)
Daily Session Note  Patient Details  Name: Jeremy Lopez MRN: [id number] Date of Birth: Oct 15, 1950 Referring Provider:   Flowsheet Row PULMONARY REHAB OTHER RESP ORIENTATION from 03/08/2023 in [facility] CARDIAC REHABILITATION  Referring Provider Dr. Dietrich Pates       Encounter Date: 06/18/2023  Check In:  Session Check In - 06/18/23 1445       Check-In   Supervising physician immediately available to respond to emergencies See telemetry face sheet for immediately available MD    Location [facility]    Staff Present Ross Ludwig, BS, Exercise Physiologist;Jayro Mcmath Daphine Deutscher, RN, BSN;Jessica [place], MA, RCEP, CCRP, CCET    Virtual Visit No    Medication changes reported     No    Fall or balance concerns reported    Yes    Comments Patient reports poor balance and exhibits shuffling gait.    Tobacco Cessation No Change    Warm-up and Cool-down Performed on first and last piece of equipment    Resistance Training Performed Yes    VAD Patient? No      Pain Assessment   Currently in Pain? No/denies             Capillary Blood Glucose: No results found for this or any previous visit (from the past 24 hour(s)).    Social History   Tobacco Use  Smoking Status Former   Current packs/day: 0.00   Types: Cigarettes   Start date: 10/25/1966   Quit date: 10/25/1998   Years since quitting: 24.6  Smokeless Tobacco Never    Goals Met:  Proper associated with RPD/PD & O2 Sat Independence with exercise equipment Using PLB without cueing & demonstrates good technique Exercise tolerated well Queuing for purse lip breathing No report of concerns or symptoms today Strength training completed today  Goals Unmet:  Not Applicable  Comments: Pt able to follow exercise prescription today without complaint.  Will continue to monitor for progression.    Dr. Erick Blinks is Medical Director for [facility].

## 2023-06-19 ENCOUNTER — Encounter (HOSPITAL_COMMUNITY): Payer: Self-pay | Admitting: *Deleted

## 2023-06-19 DIAGNOSIS — I509 Heart failure, unspecified: Secondary | ICD-10-CM

## 2023-06-19 NOTE — Progress Notes (Signed)
Pulmonary Individual Treatment Plan  Patient Details  Name: Jeremy Lopez MRN: [id number] Date of Birth: 06/04/1951 Referring Provider:   Flowsheet Row PULMONARY REHAB OTHER RESP ORIENTATION from 03/08/2023 in [facility] [place] CARDIAC REHABILITATION  Referring Provider Dr. Dietrich Pates       Initial Encounter Date:  Flowsheet Row PULMONARY REHAB OTHER RESP ORIENTATION from 03/08/2023 in [facility]  Date 03/08/23       Visit Diagnosis: Chronic congestive heart failure, unspecified heart failure type (HCC)  Patient's Home Medications on Admission:   Current Outpatient Medications:    albuterol (VENTOLIN HFA) 108 (90 Base) MCG/ACT inhaler, Inhale 2 puffs into the lungs every 6 (six) hours as needed for wheezing or shortness of breath., Disp: , Rfl:    buPROPion (WELLBUTRIN XL) 150 MG 24 hr tablet, Take 1 tablet (150 mg total) by mouth daily., Disp: 30 tablet, Rfl: 1   Cholecalciferol (VITAMIN D) 50 MCG (2000 UT) tablet, Take 2,000 Units by mouth daily., Disp: , Rfl:    clopidogrel (PLAVIX) 75 MG tablet, Take 1 tablet (75 mg total) by mouth daily with breakfast., Disp: 90 tablet, Rfl: 3   diltiazem (DILACOR XR) 240 MG 24 hr capsule, Take 1 capsule (240 mg total) by mouth daily., Disp: 90 capsule, Rfl: 3   empagliflozin (JARDIANCE) 10 MG TABS tablet, Take 1 tablet (10 mg total) by mouth daily before breakfast., Disp: 30 tablet, Rfl: 11   Fluticasone-Umeclidin-Vilant (TRELEGY ELLIPTA) 100-62.5-25 MCG/ACT AEPB, Inhale 1 puff into the lungs daily., Disp: 180 each, Rfl: 3   gabapentin (NEURONTIN) 300 MG capsule, Take 1 capsule (300 mg total) by mouth at bedtime., Disp: 90 capsule, Rfl: 3   metoprolol succinate (TOPROL-XL) 100 MG 24 hr tablet, TAKE ONE TABLET (100MG  TOTAL) BY MOUTH DAILY. TAKE WITH OR IMMEDIATELY FOLLOWING A MEAL., Disp: 90 tablet, Rfl: 2   nitroGLYCERIN (NITROSTAT) 0.4 MG SL tablet, Place 1 tablet (0.4 mg total) under the tongue every 5 (five) minutes as needed  for chest pain., Disp: 25 tablet, Rfl: 2   prochlorperazine (COMPAZINE) 5 MG tablet, Take 1 tablet (5 mg total) by mouth every 6 (six) hours as needed for nausea or vomiting., Disp: 30 tablet, Rfl: 0   rosuvastatin (CRESTOR) 40 MG tablet, TAKE ONE (1) TABLET BY MOUTH EVERY DAY, Disp: 90 tablet, Rfl: 3   sacubitril-valsartan (ENTRESTO) 49-51 MG, Take 1 tablet by mouth 2 (two) times daily., Disp: 60 tablet, Rfl: 11   Spacer/Aero-Holding Chambers DEVI, Use with inhaler, Disp: 1 each, Rfl: 2   traZODone (DESYREL) 50 MG tablet, Take 1 tablet (50 mg total) by mouth at bedtime., Disp: 30 tablet, Rfl: 1  Current Facility-Administered Medications:    sodium chloride flush (NS) 0.9 % injection 3 mL, 3 mL, Intravenous, Q12H, Dunn, Tacey Ruiz, PA-C  Past Medical History: Past Medical History:  Diagnosis Date   CAD (coronary artery disease), native coronary artery    DES proximal LAD 07/2009; DES to RCA 04/2020   Chronic heart failure with preserved ejection fraction (HFpEF) (HCC)    Essential hypertension    Habitual alcohol use    Hyperlipidemia    PVC's (premature ventricular contractions)    Small cell lung cancer (HCC)    Right upper lobe 2008; no evidence of disease since treatment ended in 11/2007    Tobacco Use: Social History   Tobacco Use  Smoking Status Former   Current packs/day: 0.00   Types: Cigarettes   Start date: 10/25/1966   Quit date: 10/25/1998  Years since quitting: 24.6  Smokeless Tobacco Never    Labs: Review Flowsheet  More data exists      Latest Ref Rng & Units 12/28/2019 04/26/2020 01/02/2021 06/13/2022 01/18/2023  Labs for ITP Cardiac and Pulmonary Rehab  Cholestrol 100 - 199 mg/dL 409  - 811  914  782   LDL (calc) 0 - 99 mg/dL 61  - 49  40  76   HDL-C >39 mg/dL 42  - 49  51  54   Trlycerides 0 - 149 mg/dL 956  - 213  086  578   Hemoglobin A1c 4.8 - 5.6 % - - - - 5.8   PH, Arterial 7.350 - 7.450 - 7.386  - - -  PCO2 arterial 32.0 - 48.0 mmHg - 45.1  - - -   Bicarbonate 20.0 - 28.0 mmol/L - 27.0  25.1  - - -  TCO2 22 - 32 mmol/L - 28  26  - - -  O2 Saturation % - 72.0  94.0  - - -    Details       Multiple values from one day are sorted in reverse-chronological order         Capillary Blood Glucose: Lab Results  Component Value Date   GLUCAP 101 (H) 03/21/2023   GLUCAP 106 (H) 03/19/2023   GLUCAP 89 03/19/2023   GLUCAP 95 03/14/2023   GLUCAP 104 (H) 03/12/2023     Pulmonary Assessment Scores:  Pulmonary Assessment Scores     Row Name 03/08/23 1345         ADL UCSD   ADL Phase Entry     SOB Score total 15     Rest 0     Walk 1     Stairs 1     Bath 0     Dress 0     Shop 2       CAT Score   CAT Score 14       mMRC Score   mMRC Score 2             UCSD: Self-administered rating of dyspnea associated with activities of daily living (ADLs) 6-point scale (0 = "not at all" to 5 = "maximal or unable to do because of breathlessness")  Scoring Scores range from 0 to 120.  Minimally important difference is 5 units  CAT: CAT can identify the health impairment of COPD patients and is better correlated with disease progression.  CAT has a scoring range of zero to 40. The CAT score is classified into four groups of low (less than 10), medium (10 - 20), high (21-30) and very high (31-40) based on the impact level of disease on health status. A CAT score over 10 suggests significant symptoms.  A worsening CAT score could be explained by an exacerbation, poor medication adherence, poor inhaler technique, or progression of COPD or comorbid conditions.  CAT MCID is 2 points  mMRC: mMRC (Modified Medical Research Council) Dyspnea Scale is used to assess the degree of baseline functional disability in patients of respiratory disease due to dyspnea. No minimal important difference is established. A decrease in score of 1 point or greater is considered a positive change.   Pulmonary Function Assessment:   Exercise Target  Goals: Exercise Program Goal: Individual exercise prescription set using results from initial 6 min walk test and THRR while considering  patient's activity barriers and safety.   Exercise Prescription Goal: Initial exercise prescription builds to 30-45 minutes a  day of aerobic activity, 2-3 days per week.  Home exercise guidelines will be given to patient during program as part of exercise prescription that the participant will acknowledge.  Activity Barriers & Risk Stratification:  Activity Barriers & Cardiac Risk Stratification - 03/08/23 1246       Activity Barriers & Cardiac Risk Stratification   Activity Barriers Shortness of Breath    Cardiac Risk Stratification High             6 Minute Walk:  6 Minute Walk     Row Name 03/08/23 1346         6 Minute Walk   Phase Initial     Distance 1200 feet     Walk Time 6 minutes     # of Rest Breaks 0     MPH 2.27     METS 2.63     RPE 11     Perceived Dyspnea  11     VO2 Peak 9.2     Symptoms No     Resting HR 75 bpm     Resting BP 120/68     Resting Oxygen Saturation  95 %     Exercise Oxygen Saturation  during 6 min walk 93 %     Max Ex. HR 103 bpm     Max Ex. BP 148/76     2 Minute Post BP 124/76       Interval HR   1 Minute HR 95     2 Minute HR 100     3 Minute HR 100     4 Minute HR 101     5 Minute HR 102     6 Minute HR 103     2 Minute Post HR 83     Interval Heart Rate? Yes       Interval Oxygen   Interval Oxygen? Yes     Baseline Oxygen Saturation % 95 %     1 Minute Oxygen Saturation % 94 %     1 Minute Liters of Oxygen 0 L     2 Minute Oxygen Saturation % 93 %     2 Minute Liters of Oxygen 0 L     3 Minute Oxygen Saturation % 94 %     3 Minute Liters of Oxygen 0 L     4 Minute Oxygen Saturation % 94 %     4 Minute Liters of Oxygen 0 L     5 Minute Oxygen Saturation % 94 %     5 Minute Liters of Oxygen 0 L     6 Minute Oxygen Saturation % 94 %     6 Minute Liters of Oxygen 0 L     2  Minute Post Oxygen Saturation % 96 %     2 Minute Post Liters of Oxygen 0 L              Oxygen Initial Assessment:  Oxygen Initial Assessment - 03/08/23 1345       Home Oxygen   Home Oxygen Device None    Sleep Oxygen Prescription None    Home Exercise Oxygen Prescription None    Home Resting Oxygen Prescription None      Initial 6 min Walk   Oxygen Used None      Program Oxygen Prescription   Program Oxygen Prescription None      Intervention   Short Term Goals To learn and understand importance of monitoring SPO2 with  pulse oximeter and demonstrate accurate use of the pulse oximeter.;To learn and understand importance of maintaining oxygen saturations>88%;To learn and demonstrate proper pursed lip breathing techniques or other breathing techniques.     Long  Term Goals Verbalizes importance of monitoring SPO2 with pulse oximeter and return demonstration;Maintenance of O2 saturations>88%;Exhibits proper breathing techniques, such as pursed lip breathing or other method taught during program session;Compliance with respiratory medication             Oxygen Re-Evaluation:  Oxygen Re-Evaluation     Row Name 03/26/23 1300 04/23/23 1525 06/13/23 1618         Program Oxygen Prescription   Program Oxygen Prescription None None None       Home Oxygen   Home Oxygen Device None None None     Sleep Oxygen Prescription None None None     Home Exercise Oxygen Prescription None None None     Home Resting Oxygen Prescription None None None       Goals/Expected Outcomes   Short Term Goals To learn and understand importance of monitoring SPO2 with pulse oximeter and demonstrate accurate use of the pulse oximeter.;To learn and understand importance of maintaining oxygen saturations>88%;To learn and demonstrate proper pursed lip breathing techniques or other breathing techniques.  To learn and understand importance of monitoring SPO2 with pulse oximeter and demonstrate accurate use  of the pulse oximeter.;To learn and understand importance of maintaining oxygen saturations>88%;To learn and demonstrate proper pursed lip breathing techniques or other breathing techniques.  To learn and understand importance of monitoring SPO2 with pulse oximeter and demonstrate accurate use of the pulse oximeter.;To learn and understand importance of maintaining oxygen saturations>88%;To learn and demonstrate proper pursed lip breathing techniques or other breathing techniques. ;To learn and demonstrate proper use of respiratory medications     Long  Term Goals Verbalizes importance of monitoring SPO2 with pulse oximeter and return demonstration;Maintenance of O2 saturations>88%;Exhibits proper breathing techniques, such as pursed lip breathing or other method taught during program session;Compliance with respiratory medication Verbalizes importance of monitoring SPO2 with pulse oximeter and return demonstration;Maintenance of O2 saturations>88%;Exhibits proper breathing techniques, such as pursed lip breathing or other method taught during program session;Compliance with respiratory medication Maintenance of O2 saturations>88%;Exhibits proper breathing techniques, such as pursed lip breathing or other method taught during program session;Verbalizes importance of monitoring SPO2 with pulse oximeter and return demonstration;Compliance with respiratory medication     Comments -- -- JP is doing well in rehab.  His sats are doing well and he is checking them at home as well. He has really noticed a diffence in his breathing since using the PLB more.  He says that it has really made a difference for him.  He is also doing well with his Trellegy and not needed his albuterol inhaler recently.     Goals/Expected Outcomes -- -- Short: continue to use PLB Long; continue to manage breathing              Oxygen Discharge (Final Oxygen Re-Evaluation):  Oxygen Re-Evaluation - 06/13/23 1618       Program Oxygen  Prescription   Program Oxygen Prescription None      Home Oxygen   Home Oxygen Device None    Sleep Oxygen Prescription None    Home Exercise Oxygen Prescription None    Home Resting Oxygen Prescription None      Goals/Expected Outcomes   Short Term Goals To learn and understand importance of monitoring SPO2 with pulse oximeter and  demonstrate accurate use of the pulse oximeter.;To learn and understand importance of maintaining oxygen saturations>88%;To learn and demonstrate proper pursed lip breathing techniques or other breathing techniques. ;To learn and demonstrate proper use of respiratory medications    Long  Term Goals Maintenance of O2 saturations>88%;Exhibits proper breathing techniques, such as pursed lip breathing or other method taught during program session;Verbalizes importance of monitoring SPO2 with pulse oximeter and return demonstration;Compliance with respiratory medication    Comments JP is doing well in rehab.  His sats are doing well and he is checking them at home as well. He has really noticed a diffence in his breathing since using the PLB more.  He says that it has really made a difference for him.  He is also doing well with his Trellegy and not needed his albuterol inhaler recently.    Goals/Expected Outcomes Short: continue to use PLB Long; continue to manage breathing             Initial Exercise Prescription:  Initial Exercise Prescription - 03/08/23 1400       Date of Initial Exercise RX and Referring Provider   Date 03/08/23    Referring Provider Dr. Dietrich Pates    Expected Discharge Date 07/11/23      Treadmill   MPH 1.6    Grade 0    Minutes 17      NuStep   Level 1    SPM 80    Minutes 22      Prescription Details   Frequency (times per week) 2    Duration Progress to 30 minutes of continuous aerobic without signs/symptoms of physical distress      Intensity   THRR 40-80% of Max Heartrate 60-119    Ratings of Perceived Exertion 11-13     Perceived Dyspnea 0-4      Resistance Training   Training Prescription Yes    Weight 5    Reps 10-15             Perform Capillary Blood Glucose checks as needed.  Exercise Prescription Changes:   Exercise Prescription Changes     Row Name 03/12/23 1500 03/26/23 1500 04/04/23 1500 06/13/23 1500       Response to Exercise   Blood Pressure (Admit) 130/72 112/78 126/60 134/62    Blood Pressure (Exercise) 170/78 140/70 124/68 --    Blood Pressure (Exit) 130/64 120/60 126/50 140/70    Heart Rate (Admit) 88 bpm 80 bpm 66 bpm 72 bpm    Heart Rate (Exercise) 100 bpm 102 bpm 93 bpm 93 bpm    Heart Rate (Exit) 84 bpm 86 bpm 68 bpm 73 bpm    Oxygen Saturation (Admit) 94 % 96 % 94 % 95 %    Oxygen Saturation (Exercise) 95 % 95 % 95 % 94 %    Oxygen Saturation (Exit) 95 % 95 % 94 % 96 %    Rating of Perceived Exertion (Exercise) 11 9 9 11     Perceived Dyspnea (Exercise) 12 9 9 1     Duration Continue with 30 min of aerobic exercise without signs/symptoms of physical distress. Continue with 30 min of aerobic exercise without signs/symptoms of physical distress. Continue with 30 min of aerobic exercise without signs/symptoms of physical distress. Continue with 30 min of aerobic exercise without signs/symptoms of physical distress.    Intensity THRR unchanged THRR unchanged THRR unchanged THRR unchanged      Progression   Progression Continue to progress workloads to maintain intensity without  signs/symptoms of physical distress. Continue to progress workloads to maintain intensity without signs/symptoms of physical distress. Continue to progress workloads to maintain intensity without signs/symptoms of physical distress. Continue to progress workloads to maintain intensity without signs/symptoms of physical distress.      Resistance Training   Training Prescription Yes Yes Yes Yes    Weight 3 5 5 5     Reps 10-15 10-15 10-15 10-15    Time 10 Minutes 10 Minutes 10 Minutes --       Treadmill   MPH 1.3 1.5 1.5 1.4    Grade 0 0 0 0    Minutes 17 17 17 15     METs 2 2.15 2.15 2.07      NuStep   Level 1 2 2 2     SPM 78 85 76 86    Minutes 22 22 22 15     METs 1.6 1.6 1.6 1.6      Oxygen   Maintain Oxygen Saturation -- -- -- 88% or higher             Exercise Comments:   Exercise Goals and Review:   Exercise Goals     Row Name 03/08/23 1403 03/26/23 1248 04/23/23 1519         Exercise Goals   Increase Physical Activity Yes Yes Yes     Intervention Provide advice, education, support and counseling about physical activity/exercise needs.;Develop an individualized exercise prescription for aerobic and resistive training based on initial evaluation findings, risk stratification, comorbidities and participant's personal goals. Provide advice, education, support and counseling about physical activity/exercise needs.;Develop an individualized exercise prescription for aerobic and resistive training based on initial evaluation findings, risk stratification, comorbidities and participant's personal goals. Provide advice, education, support and counseling about physical activity/exercise needs.;Develop an individualized exercise prescription for aerobic and resistive training based on initial evaluation findings, risk stratification, comorbidities and participant's personal goals.     Expected Outcomes Short Term: Attend rehab on a regular basis to increase amount of physical activity.;Long Term: Add in home exercise to make exercise part of routine and to increase amount of physical activity.;Long Term: Exercising regularly at least 3-5 days a week. Short Term: Attend rehab on a regular basis to increase amount of physical activity.;Long Term: Add in home exercise to make exercise part of routine and to increase amount of physical activity.;Long Term: Exercising regularly at least 3-5 days a week. Short Term: Attend rehab on a regular basis to increase amount of physical  activity.;Long Term: Add in home exercise to make exercise part of routine and to increase amount of physical activity.;Long Term: Exercising regularly at least 3-5 days a week.     Increase Strength and Stamina Yes Yes Yes     Intervention Provide advice, education, support and counseling about physical activity/exercise needs.;Develop an individualized exercise prescription for aerobic and resistive training based on initial evaluation findings, risk stratification, comorbidities and participant's personal goals. Provide advice, education, support and counseling about physical activity/exercise needs.;Develop an individualized exercise prescription for aerobic and resistive training based on initial evaluation findings, risk stratification, comorbidities and participant's personal goals. Provide advice, education, support and counseling about physical activity/exercise needs.;Develop an individualized exercise prescription for aerobic and resistive training based on initial evaluation findings, risk stratification, comorbidities and participant's personal goals.     Expected Outcomes Short Term: Increase workloads from initial exercise prescription for resistance, speed, and METs.;Short Term: Perform resistance training exercises routinely during rehab and add in resistance training at home;Long Term:  Improve cardiorespiratory fitness, muscular endurance and strength as measured by increased METs and functional capacity ( ) Short Term: Increase workloads from initial exercise prescription for resistance, speed, and METs.;Short Term: Perform resistance training exercises routinely during rehab and add in resistance training at home;Long Term: Improve cardiorespiratory fitness, muscular endurance and strength as measured by increased METs and functional capacity ( ) Short Term: Increase workloads from initial exercise prescription for resistance, speed, and METs.;Short Term: Perform resistance training  exercises routinely during rehab and add in resistance training at home;Long Term: Improve cardiorespiratory fitness, muscular endurance and strength as measured by increased METs and functional capacity ( )     Able to understand and use rate of perceived exertion (RPE) scale Yes Yes Yes     Intervention Provide education and explanation on how to use RPE scale Provide education and explanation on how to use RPE scale Provide education and explanation on how to use RPE scale     Expected Outcomes Short Term: Able to use RPE daily in rehab to express subjective intensity level;Long Term:  Able to use RPE to guide intensity level when exercising independently Short Term: Able to use RPE daily in rehab to express subjective intensity level;Long Term:  Able to use RPE to guide intensity level when exercising independently Short Term: Able to use RPE daily in rehab to express subjective intensity level;Long Term:  Able to use RPE to guide intensity level when exercising independently     Able to understand and use Dyspnea scale Yes Yes Yes     Intervention Provide education and explanation on how to use Dyspnea scale Provide education and explanation on how to use Dyspnea scale Provide education and explanation on how to use Dyspnea scale     Expected Outcomes Short Term: Able to use Dyspnea scale daily in rehab to express subjective sense of shortness of breath during exertion;Long Term: Able to use Dyspnea scale to guide intensity level when exercising independently Short Term: Able to use Dyspnea scale daily in rehab to express subjective sense of shortness of breath during exertion;Long Term: Able to use Dyspnea scale to guide intensity level when exercising independently Short Term: Able to use Dyspnea scale daily in rehab to express subjective sense of shortness of breath during exertion;Long Term: Able to use Dyspnea scale to guide intensity level when exercising independently     Knowledge and  understanding of Target Heart Rate Range (THRR) Yes Yes Yes     Intervention Provide education and explanation of THRR including how the numbers were predicted and where they are located for reference Provide education and explanation of THRR including how the numbers were predicted and where they are located for reference Provide education and explanation of THRR including how the numbers were predicted and where they are located for reference     Expected Outcomes Short Term: Able to state/look up THRR;Short Term: Able to use daily as guideline for intensity in rehab;Long Term: Able to use THRR to govern intensity when exercising independently Short Term: Able to state/look up THRR;Short Term: Able to use daily as guideline for intensity in rehab;Long Term: Able to use THRR to govern intensity when exercising independently Short Term: Able to state/look up THRR;Short Term: Able to use daily as guideline for intensity in rehab;Long Term: Able to use THRR to govern intensity when exercising independently     Able to check pulse independently -- Yes --     Understanding of Exercise Prescription Yes Yes Yes  Intervention Provide education, explanation, and written materials on patient's individual exercise prescription Provide education, explanation, and written materials on patient's individual exercise prescription Provide education, explanation, and written materials on patient's individual exercise prescription     Expected Outcomes Short Term: Able to explain program exercise prescription;Long Term: Able to explain home exercise prescription to exercise independently Short Term: Able to explain program exercise prescription;Long Term: Able to explain home exercise prescription to exercise independently Short Term: Able to explain program exercise prescription;Long Term: Able to explain home exercise prescription to exercise independently              Exercise Goals Re-Evaluation :  Exercise  Goals Re-Evaluation     Row Name 03/26/23 1249 04/23/23 1519 06/13/23 1610 06/18/23 0859       Exercise Goal Re-Evaluation   Exercise Goals Review Increase Physical Activity;Increase Strength and Stamina;Able to understand and use rate of perceived exertion (RPE) scale;Able to understand and use Dyspnea scale;Knowledge and understanding of Target Heart Rate Range (THRR);Understanding of Exercise Prescription Increase Physical Activity;Increase Strength and Stamina;Able to understand and use rate of perceived exertion (RPE) scale;Able to understand and use Dyspnea scale;Knowledge and understanding of Target Heart Rate Range (THRR);Understanding of Exercise Prescription -- Increase Physical Activity;Increase Strength and Stamina;Able to understand and use Dyspnea scale;Understanding of Exercise Prescription    Comments Pt has completed 4 sessions of pulmoanry rehab. He is tolerating exercise and progressing his workloads. He is currently exercising at 2.15 METs on the treadmill. Will continue to monitor and progress as able. Pt has completed 9 sessions of pulmonary rehab. He continues to be motivated and in progressing in his workloads. He is currently exercising at 2.15 METs on the treadmill. Will continue to monitor and progress as able . JP is doing well in rehab.  He is already using a treadmill at home for 15 min each day.  He says one of his biggest limitations is his weaker legs.  He was encouraged to do more weights at home and to walk longer to build up the stamina.  He has noticed that he is feeling stronger overall, but legs are still weak. JD is doing well in rehab. He has not increased his treadmill speed due to weak legs and shuffling but has improved his NuStep level to 2. Will continue to monitor and progress as able.    Expected Outcomes Through exercising at rehab and a home exercise plan, patient will achieve his goals. Through exercising at rehab and a home exercise plan, patient will  achieve his goals. Short: Add in more weights at home to build strength Long: Continue to improve stamina Short: increase NuStep level to 3 in the next two weeeks   long term: continue to attend pulm rehab and exercise at home             Discharge Exercise Prescription (Final Exercise Prescription Changes):  Exercise Prescription Changes - 06/13/23 1500       Response to Exercise   Blood Pressure (Admit) 134/62    Blood Pressure (Exit) 140/70    Heart Rate (Admit) 72 bpm    Heart Rate (Exercise) 93 bpm    Heart Rate (Exit) 73 bpm    Oxygen Saturation (Admit) 95 %    Oxygen Saturation (Exercise) 94 %    Oxygen Saturation (Exit) 96 %    Rating of Perceived Exertion (Exercise) 11    Perceived Dyspnea (Exercise) 1    Duration Continue with 30 min of aerobic exercise without  signs/symptoms of physical distress.    Intensity THRR unchanged      Progression   Progression Continue to progress workloads to maintain intensity without signs/symptoms of physical distress.      Resistance Training   Training Prescription Yes    Weight 5    Reps 10-15      Treadmill   MPH 1.4    Grade 0    Minutes 15    METs 2.07      NuStep   Level 2    SPM 86    Minutes 15    METs 1.6      Oxygen   Maintain Oxygen Saturation 88% or higher             Nutrition:  Target Goals: Understanding of nutrition guidelines, daily intake of sodium 1500mg , cholesterol 200mg , calories 30% from fat and 7% or less from saturated fats, daily to have 5 or more servings of fruits and vegetables.  Biometrics:  Pre Biometrics - 03/08/23 1404       Pre Biometrics   Height 5\' 10"  (1.778 m)    Weight 219 lb 5.7 oz (99.5 kg)    Waist Circumference 46.5 inches    Hip Circumference 44.5 inches    Waist to Hip Ratio 1.04 %    BMI (Calculated) 31.47    Triceps Skinfold 15 mm    % Body Fat 31.5 %    Grip Strength 39.8 kg    Flexibility 0 in    Single Leg Stand 8 seconds               Nutrition Therapy Plan and Nutrition Goals:  Nutrition Therapy & Goals - 04/15/23 1427       Nutrition Therapy   RD appointment deferred Yes      Personal Nutrition Goals   Comments We provide educational sessions on heart healthy nutrition with handouts.      Intervention Plan   Intervention Nutrition handout(s) given to patient.    Expected Outcomes Short Term Goal: Understand basic principles of dietary content, such as calories, fat, sodium, cholesterol and nutrients.             Nutrition Assessments:  Nutrition Assessments - 03/08/23 1323       MEDFICTS Scores   Pre Score 15            MEDIFICTS Score Key: >=70 Need to make dietary changes  40-70 Heart Healthy Diet <= 40 Therapeutic Level Cholesterol Diet   Picture Your Plate Scores: <78 Unhealthy dietary pattern with much room for improvement. 41-50 Dietary pattern unlikely to meet recommendations for good health and room for improvement. 51-60 More healthful dietary pattern, with some room for improvement.  >60 Healthy dietary pattern, although there may be some specific behaviors that could be improved.    Nutrition Goals Re-Evaluation:  Nutrition Goals Re-Evaluation     Row Name 06/13/23 1614             Goals   Nutrition Goal Continue to eat healthy       Comment JP is doing well in rehab.  His diet is going well and he enjoys eating.  He does admit to needing to work on portion control, but otherwise feels like he has a good handle on what to eat or not eat.  They eat lots of fruits and vegetables.  His motto for protein is that it has to "fly or swim, or don't eat it"!  His wife  is supportive in his diet as well.       Expected Outcome Short: COnitnue to work on portion control Long; Continue to eat healthy                Nutrition Goals Discharge (Final Nutrition Goals Re-Evaluation):  Nutrition Goals Re-Evaluation - 06/13/23 1614       Goals   Nutrition Goal Continue to eat  healthy    Comment JP is doing well in rehab.  His diet is going well and he enjoys eating.  He does admit to needing to work on portion control, but otherwise feels like he has a good handle on what to eat or not eat.  They eat lots of fruits and vegetables.  His motto for protein is that it has to "fly or swim, or don't eat it"!  His wife is supportive in his diet as well.    Expected Outcome Short: COnitnue to work on portion control Long; Continue to eat healthy             Psychosocial: Target Goals: Acknowledge presence or absence of significant depression and/or stress, maximize coping skills, provide positive support system. Participant is able to verbalize types and ability to use techniques and skills needed for reducing stress and depression.  Initial Review & Psychosocial Screening:  Initial Psych Review & Screening - 03/08/23 1327       Initial Review   Current issues with None Identified      Family Dynamics   Good Support System? Yes      Barriers   Psychosocial barriers to participate in program There are no identifiable barriers or psychosocial needs.;The patient should benefit from training in stress management and relaxation.      Screening Interventions   Interventions Encouraged to exercise;To provide support and resources with identified psychosocial needs    Expected Outcomes Short Term goal: Identification and review with participant of any Quality of Life or Depression concerns found by scoring the questionnaire.;Short Term goal: Utilizing psychosocial counselor, staff and physician to assist with identification of specific Stressors or current issues interfering with healing process. Setting desired goal for each stressor or current issue identified.             Quality of Life Scores:  Quality of Life - 03/08/23 1345       Quality of Life   Select Quality of Life      Quality of Life Scores   Health/Function Pre 21.38 %    Socioeconomic Pre 29 %     Psych/Spiritual Pre 30 %    Family Pre 27.6 %    GLOBAL Pre 25.41 %            Scores of 19 and below usually indicate a poorer quality of life in these areas.  A difference of  2-3 points is a clinically meaningful difference.  A difference of 2-3 points in the total score of the Quality of Life Index has been associated with significant improvement in overall quality of life, self-image, physical symptoms, and general health in studies assessing change in quality of life.   PHQ-9: Review Flowsheet  More data exists      04/30/2023 04/03/2023 03/08/2023 01/18/2023 10/21/2020  Depression screen PHQ 2/9  Decreased Interest   0 0 0  Down, Depressed, Hopeless   0 0 0  PHQ - 2 Score   0 0 0  Altered sleeping   1 3 0  Tired, decreased energy  1 3 0  Change in appetite   0 0 0  Feeling bad or failure about yourself    0 0 0  Trouble concentrating   0 0 0  Moving slowly or fidgety/restless   0 0 0  Suicidal thoughts  - 0 0 0  PHQ-9 Score   2 6 0  Difficult doing work/chores - - Not difficult at all - Not difficult at all    Details       Information is confidential and restricted. Go to Review Flowsheets to unlock data.        Interpretation of Total Score  Total Score Depression Severity:  1-4 = Minimal depression, 5-9 = Mild depression, 10-14 = Moderate depression, 15-19 = Moderately severe depression, 20-27 = Severe depression   Psychosocial Evaluation and Intervention:  Psychosocial Evaluation - 03/08/23 1352       Psychosocial Evaluation & Interventions   Interventions Stress management education;Relaxation education;Encouraged to exercise with the program and follow exercise prescription    Comments Patient has no psychosocial barriers or issues identified at his orientation visit. His PHQ-9 score was 2 due to lack of energy and trouble staying alseep at times. He denies any depression or anxiety. He has participated in cardiac rehab with [place] and is very familiar with  our staff and the program. He is a retired Occupational hygienist. He has a great support system with his wife, 2 children and several grandchildren and he has a brother and several friends that also support him. He is very motivated to get back into an exercise routine and is ready to start the program.    Expected Outcomes Patient will have no psychosocial issues identified.    Continue Psychosocial Services  No Follow up required             Psychosocial Re-Evaluation:  Psychosocial Re-Evaluation     Row Name 03/19/23 1313 04/15/23 1427 06/13/23 1612         Psychosocial Re-Evaluation   Current issues with None Identified None Identified None Identified     Comments Patient is new to PR program attending 2 session.  He was referred to PR by Dr. Tenny Craw with Chronic heart failure.  Patient initial QOL score is 25.41% and his PHQ-9 score is 2.  He is very pleasant and interactive with class and staff.  We will continue to monitor his progress as he works toward meeting these goals. Patient has completed 7 sessions.  He continues to have no psychosocial barriers identified. He sees a Veterinary surgeon routinely for adjustment disorder and depression.  He seems to enjoy the sessions. We will continue to monitor his progress. JP is doing well in rehab.  He denies any major stressors and feeling good mentally.  He usually sleeps well and gets about 5 hrs a night which he feels is good for him.  He feels like he is currently in a good place and is enjoying rehab and can tell that it is helping.     Expected Outcomes Patient will have no psychological issues identified at discharge Patient will have no psychological issues identified. Short: Continue to stay positive Long:Continue to exercise for mental boost     Interventions Stress management education;Relaxation education;Encouraged to attend Pulmonary Rehabilitation for the exercise Stress management education;Relaxation education;Encouraged to attend Pulmonary  Rehabilitation for the exercise Encouraged to attend Pulmonary Rehabilitation for the exercise     Continue Psychosocial Services  No Follow up required No Follow up required Follow up required  by staff              Psychosocial Discharge (Final Psychosocial Re-Evaluation):  Psychosocial Re-Evaluation - 06/13/23 1612       Psychosocial Re-Evaluation   Current issues with None Identified    Comments JP is doing well in rehab.  He denies any major stressors and feeling good mentally.  He usually sleeps well and gets about 5 hrs a night which he feels is good for him.  He feels like he is currently in a good place and is enjoying rehab and can tell that it is helping.    Expected Outcomes Short: Continue to stay positive Long:Continue to exercise for mental boost    Interventions Encouraged to attend Pulmonary Rehabilitation for the exercise    Continue Psychosocial Services  Follow up required by staff              Education: Education Goals: Education classes will be provided on a weekly basis, covering required topics. Participant will state understanding/return demonstration of topics presented.  Learning Barriers/Preferences:  Learning Barriers/Preferences - 03/08/23 1324       Learning Barriers/Preferences   Learning Barriers None    Learning Preferences Written Material             Education Topics: How Lungs Work and Diseases: - Discuss the anatomy of the lungs and diseases that can affect the lungs, such as COPD. Flowsheet Row PULMONARY REHAB OTHER RESPIRATORY from 06/13/2023 in [place] [facility]  Date 06/13/23  Educator [facility]  Instruction Review Code 1- Verbalizes Understanding       Exercise: -Discuss the importance of exercise, FITT principles of exercise, normal and abnormal responses to exercise, and how to exercise safely.   Environmental Irritants: -Discuss types of environmental irritants and how to limit exposure to environmental  irritants. Flowsheet Row PULMONARY REHAB OTHER RESPIRATORY from 06/13/2023 in [place] [facility]  Date 04/18/23  Educator handout       Meds/Inhalers and oxygen: - Discuss respiratory medications, definition of an inhaler and oxygen, and the proper way to use an inhaler and oxygen. Flowsheet Row PULMONARY REHAB OTHER RESPIRATORY from 06/13/2023 in [place] [facility]  Date 04/25/23  Educator handout       Energy Saving Techniques: - Discuss methods to conserve energy and decrease shortness of breath when performing activities of daily living.    Bronchial Hygiene / Breathing Techniques: - Discuss breathing mechanics, pursed-lip breathing technique,  proper posture, effective ways to clear airways, and other functional breathing techniques   Cleaning Equipment: - Provides group verbal and written instruction about the health risks of elevated stress, cause of high stress, and healthy ways to reduce stress.   Nutrition I: Fats: - Discuss the types of cholesterol, what cholesterol does to the body, and how cholesterol levels can be controlled. Flowsheet Row CARDIAC REHAB PHASE II EXERCISE from 10/05/2020 in [place] [facility]  Date 08/31/20  Educator [facility]  Instruction Review Code 2- Demonstrated Understanding       Nutrition II: Labels: -Discuss the different components of food labels and how to read food labels. Flowsheet Row CARDIAC REHAB PHASE II EXERCISE from 10/05/2020 in [place] [facility]  Date 09/07/20  Educator DF  Instruction Review Code 2- Demonstrated Understanding       Respiratory Infections: - Discuss the signs and symptoms of respiratory infections, ways to prevent respiratory infections, and the importance of seeking medical treatment when having a respiratory infection. Flowsheet Row PULMONARY  REHAB OTHER RESPIRATORY from 06/13/2023 in [place] [facility]  Date 03/14/23  Educator  handout       Stress I: Signs and Symptoms: - Discuss the causes of stress, how stress may lead to anxiety and depression, and ways to limit stress. Flowsheet Row PULMONARY REHAB OTHER RESPIRATORY from 06/13/2023 in [place] [facility]  Date 03/21/23  Educator handout       Stress II: Relaxation: -Discuss relaxation techniques to limit stress. Flowsheet Row PULMONARY REHAB OTHER RESPIRATORY from 06/13/2023 in [place] [facility]  Date 05/30/23  Educator [facility]  Instruction Review Code 1- Verbalizes Understanding       Oxygen for Home/Travel: - Discuss how to prepare for travel when on oxygen and proper ways to transport and store oxygen to ensure safety. Flowsheet Row PULMONARY REHAB OTHER RESPIRATORY from 06/13/2023 in [place] [facility]  Date 04/04/23  Educator handout       Knowledge Questionnaire Score:  Knowledge Questionnaire Score - 03/08/23 1324       Knowledge Questionnaire Score   Pre Score 14/18             Core Components/Risk Factors/Patient Goals at Admission:  Personal Goals and Risk Factors at Admission - 03/08/23 1325       Core Components/Risk Factors/Patient Goals on Admission    Weight Management Obesity    Improve shortness of breath with ADL's Yes    Intervention Provide education, individualized exercise plan and daily activity instruction to help decrease symptoms of SOB with activities of daily living.    Expected Outcomes Short Term: Improve cardiorespiratory fitness to achieve a reduction of symptoms when performing ADLs;Long Term: Be able to perform more ADLs without symptoms or delay the onset of symptoms    Heart Failure Yes    Intervention Provide a combined exercise and nutrition program that is supplemented with education, support and counseling about heart failure. Directed toward relieving symptoms such as shortness of breath, decreased exercise tolerance, and extremity edema.    Expected  Outcomes Short term: Attendance in program 2-3 days a week with increased exercise capacity. Reported lower sodium intake. Reported increased fruit and vegetable intake. Reports medication compliance.;Long term: Adoption of self-care skills and reduction of barriers for early signs and symptoms recognition and intervention leading to self-care maintenance.    Hypertension Yes    Intervention Provide education on lifestyle modifcations including regular physical activity/exercise, weight management, moderate sodium restriction and increased consumption of fresh fruit, vegetables, and low fat dairy, alcohol moderation, and smoking cessation.;Monitor prescription use compliance.    Expected Outcomes Short Term: Continued assessment and intervention until BP is < 140/14mm HG in hypertensive participants. < 130/27mm HG in hypertensive participants with diabetes, heart failure or chronic kidney disease.;Long Term: Maintenance of blood pressure at goal levels.    Personal Goal Other Yes    Personal Goal Patient wants to improve his balance and wallking ability and decrease his SOB with activity.    Intervention Patient will attend PR 3 days/week with exercise and education.    Expected Outcomes Patient will complete the program meeting both personal and program goals.             Core Components/Risk Factors/Patient Goals Review:   Goals and Risk Factor Review     Row Name 03/19/23 1323 04/15/23 1429 06/13/23 1616         Core Components/Risk Factors/Patient Goals Review   Personal Goals Review Develop more efficient breathing techniques such as purse  lipped breathing and diaphragmatic breathing and practicing self-pacing with activity.;Improve shortness of breath with ADL's;Heart Failure Develop more efficient breathing techniques such as purse lipped breathing and diaphragmatic breathing and practicing self-pacing with activity.;Improve shortness of breath with ADL's;Heart Failure Heart  Failure;Improve shortness of breath with ADL's;Increase knowledge of respiratory medications and ability to use respiratory devices properly.;Weight Management/Obesity     Review Patient is new to PR program, attending 2 sessions.  He was referred to PR via Dr. Sherene Sires with chronic heart failure.  His personal goals is to imporve his balance and walking ability and decrease SOB with activity.  He tolerates exercise well on treadmill and nu-step with his O2 sats at 94%-95% and no supplemental oxygen needed at this time.  We will encourage him to increase his workload as he progresses in the program.   We will continue to monitor his progress as he works toward meeting these goals. Patient has completed 7 sessions. He is doing well in the program with consistent attendance. He is exercising on RA with his O2 sats averaging 96%.  His personal goals is to imporve his balance and walking ability and decrease SOB with activity. We will continue to monitor his progress as he works toward meeting these goals. JP is doing well in rehab.  His pressures are doing well and he checks them daily. He also weighs every morning and weight is coming down.  His breathing is getting better and the PLB is helping.   He feels that his Trellegy is really making a difference and he has not needed to use his albuterol inhaler recently.     Expected Outcomes Patient will complete the program meeting both program and personal goals at discharge. Patient will complete the program meeting both personal and program goals. short: Continue to work on weight loss long: contineu to monitor risk factors              Core Components/Risk Factors/Patient Goals at Discharge (Final Review):   Goals and Risk Factor Review - 06/13/23 1616       Core Components/Risk Factors/Patient Goals Review   Personal Goals Review Heart Failure;Improve shortness of breath with ADL's;Increase knowledge of respiratory medications and ability to use respiratory  devices properly.;Weight Management/Obesity    Review JP is doing well in rehab.  His pressures are doing well and he checks them daily. He also weighs every morning and weight is coming down.  His breathing is getting better and the PLB is helping.   He feels that his Trellegy is really making a difference and he has not needed to use his albuterol inhaler recently.    Expected Outcomes short: Continue to work on weight loss long: contineu to monitor risk factors             ITP Comments:  ITP Comments     Row Name 04/24/23 1614 05/08/23 0741 05/16/23 1529 05/22/23 1520 06/19/23 1527   ITP Comments 30 day review completed. ITP sent to Dr.Jehanzeb Memon, Medical Director of  Pulmonary Rehab. Continue with ITP unless changes are made by physician. Pt tested positive for COVID 05/04/23 will return 10days after postiive test to rehab without symptoms JP called to let us know that he tested postive for COVID again.  He was told by his doctor that this could happen. He also continues to have a cough.  He was encouraged to stay home until symptoms resolve.  He plans to check in next week.  Since being out, we  have not reviewed goals this round. 30 day review completed. ITP sent to Dr.Jehanzeb Memon, Medical Director of  Pulmonary Rehab. Continue with ITP unless changes are made by physician. 30 day review completed. ITP sent to Dr.Jehanzeb Memon, Medical Director of  Pulmonary Rehab. Continue with ITP unless changes are made by physician.            Comments: 30 day review

## 2023-06-20 ENCOUNTER — Encounter (HOSPITAL_COMMUNITY): Payer: HMO

## 2023-06-21 ENCOUNTER — Ambulatory Visit (INDEPENDENT_AMBULATORY_CARE_PROVIDER_SITE_OTHER): Payer: HMO | Admitting: Behavioral Health

## 2023-06-21 ENCOUNTER — Encounter: Payer: Self-pay | Admitting: Behavioral Health

## 2023-06-21 DIAGNOSIS — F411 Generalized anxiety disorder: Secondary | ICD-10-CM | POA: Diagnosis not present

## 2023-06-21 DIAGNOSIS — F331 Major depressive disorder, recurrent, moderate: Secondary | ICD-10-CM

## 2023-06-21 DIAGNOSIS — F4323 Adjustment disorder with mixed anxiety and depressed mood: Secondary | ICD-10-CM

## 2023-06-21 MED ORDER — ESCITALOPRAM OXALATE 10 MG PO TABS
10.0000 mg | ORAL_TABLET | Freq: Every day | ORAL | 1 refills | Status: DC
Start: 2023-06-21 — End: 2023-08-19

## 2023-06-21 MED ORDER — BUPROPION HCL ER (XL) 150 MG PO TB24: 150.0000 mg | ORAL_TABLET | Freq: Every day | ORAL | 1 refills | Status: DC

## 2023-06-21 MED ORDER — TRAZODONE HCL 50 MG PO TABS: 50.0000 mg | ORAL_TABLET | Freq: Every day | ORAL | 1 refills | Status: DC

## 2023-06-21 NOTE — Progress Notes (Signed)
[facility]  Patient ID: Jeremy Lopez,  MRN: [id number]  PCP: [name], MD  Date of Evaluation: 06/21/2023 Time spent:30 minutes  Chief Complaint:  Chief Complaint   Depression; Medication Refill; Follow-up; Anxiety; Patient Education     HISTORY/CURRENT STATUS: HPI  Jeremy Lopez", 72 year old male presents to this office for follow up and medication management. Collateral information should be considered reliable says that his family has been talking to him about seeking care for depression.  Pt says that he feels about 60% better but still struggling with anxiety.  Rates depression at 3/10 and anxiety at 5/10. Says Trazodone is helping him tremendously with sleep. He is requesting medication adjustment to help with lingering anxiety.  No mania, no psychosis, no auditory or visual hallucinations.  No SI or HI.    No past psychiatric medication trials      Individual Medical History/ Review of Systems: Changes? :No   Allergies: Cephalexin and Amlodipine  Current Medications:  Current Outpatient Medications:    escitalopram (LEXAPRO) 10 MG tablet, Take 1 tablet (10 mg total) by mouth daily., Disp: 30 tablet, Rfl: 1   albuterol (VENTOLIN HFA) 108 (90 Base) MCG/ACT inhaler, Inhale 2 puffs into the lungs every 6 (six) hours as needed for wheezing or shortness of breath., Disp: , Rfl:    buPROPion (WELLBUTRIN XL) 150 MG 24 hr tablet, Take 1 tablet (150 mg total) by mouth daily., Disp: 30 tablet, Rfl: 1   Cholecalciferol (VITAMIN D) 50 MCG (2000 UT) tablet, Take 2,000 Units by mouth daily., Disp: , Rfl:    clopidogrel (PLAVIX) 75 MG tablet, Take 1 tablet (75 mg total) by mouth daily with breakfast., Disp: 90 tablet, Rfl: 3   diltiazem (DILACOR XR) 240 MG 24 hr capsule, Take 1 capsule (240 mg total) by mouth daily., Disp: 90 capsule, Rfl: 3   empagliflozin (JARDIANCE) 10 MG TABS tablet, Take 1 tablet (10 mg total) by mouth daily before breakfast., Disp: 30 tablet, Rfl:  11   Fluticasone-Umeclidin-Vilant (TRELEGY ELLIPTA) 100-62.5-25 MCG/ACT AEPB, Inhale 1 puff into the lungs daily., Disp: 180 each, Rfl: 3   gabapentin (NEURONTIN) 300 MG capsule, Take 1 capsule (300 mg total) by mouth at bedtime., Disp: 90 capsule, Rfl: 3   metoprolol succinate (TOPROL-XL) 100 MG 24 hr tablet, TAKE ONE TABLET (100MG  TOTAL) BY MOUTH DAILY. TAKE WITH OR IMMEDIATELY FOLLOWING A MEAL., Disp: 90 tablet, Rfl: 2   nitroGLYCERIN (NITROSTAT) 0.4 MG SL tablet, Place 1 tablet (0.4 mg total) under the tongue every 5 (five) minutes as needed for chest pain., Disp: 25 tablet, Rfl: 2   prochlorperazine (COMPAZINE) 5 MG tablet, Take 1 tablet (5 mg total) by mouth every 6 (six) hours as needed for nausea or vomiting., Disp: 30 tablet, Rfl: 0   rosuvastatin (CRESTOR) 40 MG tablet, TAKE ONE (1) TABLET BY MOUTH EVERY DAY, Disp: 90 tablet, Rfl: 3   sacubitril-valsartan (ENTRESTO) 49-51 MG, Take 1 tablet by mouth 2 (two) times daily., Disp: 60 tablet, Rfl: 11   Spacer/Aero-Holding Chambers DEVI, Use with inhaler, Disp: 1 each, Rfl: 2   traZODone (DESYREL) 50 MG tablet, Take 1 tablet (50 mg total) by mouth at bedtime., Disp: 90 tablet, Rfl: 1  Current Facility-Administered Medications:    sodium chloride flush (NS) 0.9 % injection 3 mL, 3 mL, Intravenous, Q12H, Dunn, Dayna N, PA-C Medication Side Effects: none  Family Medical/ Social History: Changes? No  MENTAL HEALTH EXAM:  There were no vitals taken for this visit.There is no  height or weight on file to calculate BMI.  General Appearance: Casual, Neat, and Well Groomed  Eye Contact:  Good  Speech:  Clear and Coherent  Volume:  Normal  Mood:  NA  Affect:  Appropriate  Thought Process:  Coherent  Orientation:  Full (Time, Place, and Person)  Thought Content: Logical   Suicidal Thoughts:  No  Homicidal Thoughts:  No  Memory:  WNL  Judgement:  Good  Insight:  Good  Psychomotor Activity:  Normal  Concentration:  Concentration: Good   Recall:  Good  Fund of Knowledge: Good  Language: Good  Assets:  Desire for Improvement  ADL's:  Intact  Cognition: WNL  Prognosis:  Good    DIAGNOSES:    ICD-10-CM   1. Major depressive disorder, recurrent episode, moderate (HCC)  F33.1 escitalopram (LEXAPRO) 10 MG tablet    2. Adjustment disorder with mixed anxiety and depressed mood  F43.23 escitalopram (LEXAPRO) 10 MG tablet    buPROPion (WELLBUTRIN XL) 150 MG 24 hr tablet    traZODone (DESYREL) 50 MG tablet    3. Generalized anxiety disorder  F41.1 escitalopram (LEXAPRO) 10 MG tablet      Receiving Psychotherapy: No    RECOMMENDATIONS:   Greater than 50% of 60 min face to face time with patient was spent on counseling and coordination of care. Talked about his improvement with depression and sleep. Still needing help with anxiety. Reinforced good sleep hygiene.  Going to bed same time every night, making room dark, no bluelight contamination or electronics on when trying to go to bed.   We agreed today to:   Continue Wellbutrin 150 mg XL daily after breakfast Continue trazodone 50 mg at bedtime for assistance and improving sleep quality. To start Lexapro 10 mg daily after breakfast. To follow-up in 4 weeks to reassess Provided emergency contact information Will report worsening symptoms or side effects promptly Reviewed PDMP       Joan Flores, NP

## 2023-06-24 ENCOUNTER — Encounter: Payer: Self-pay | Admitting: Physician Assistant

## 2023-06-24 ENCOUNTER — Ambulatory Visit: Payer: HMO | Attending: Physician Assistant | Admitting: Physician Assistant

## 2023-06-24 VITALS — BP 130/62 | HR 78 | Ht 70.0 in | Wt 215.6 lb

## 2023-06-24 DIAGNOSIS — I10 Essential (primary) hypertension: Secondary | ICD-10-CM

## 2023-06-24 DIAGNOSIS — E785 Hyperlipidemia, unspecified: Secondary | ICD-10-CM | POA: Diagnosis not present

## 2023-06-24 DIAGNOSIS — I493 Ventricular premature depolarization: Secondary | ICD-10-CM

## 2023-06-24 DIAGNOSIS — I5022 Chronic systolic (congestive) heart failure: Secondary | ICD-10-CM | POA: Diagnosis not present

## 2023-06-24 DIAGNOSIS — I2510 Atherosclerotic heart disease of native coronary artery without angina pectoris: Secondary | ICD-10-CM | POA: Diagnosis not present

## 2023-06-24 MED ORDER — EMPAGLIFLOZIN 10 MG PO TABS: 10.0000 mg | ORAL_TABLET | Freq: Every day | ORAL | 0 refills | Status: DC

## 2023-06-24 NOTE — Progress Notes (Signed)
Cardiology Office Note    Date:  06/24/2023  ID:  Jeremy Lopez, DOB 08-30-1951, MRN [id number] PCP:  [name], MD  Cardiologist:  Dietrich Pates, MD  Electrophysiologist:  None   Chief Complaint: f/u HTN, med visit  History of Present Illness: .    Jeremy Lopez is a 72 y.o. male with visit-pertinent history of visit-pertinent history of CAD s/p DES to prox LAD in 2010, DES to [facility] 04/2020, HTN, HLD, suspected HFpEF, possible CKD stage 2, remote lung CA (tx with radiation/chemo), habitual ETOH intake, former tobacco abuse, prior dependent edema (no longer on amlodipine), prior occasional PVCs who is seen for follow-up.    His first stent in 2010 was prompted by chest pain that felt like elephant sitting on his chest. In 2021 he developed DOE and chest tightness. Stress test was nonischemic but cath was pursued due to persistent symptoms resulting in PCI. EF was normal at that time. Of note he also had had a CTA around that time showing radiation fibrosis. He also follows with pulmonology. I met him in 2023 when he was reporting progressive DOE. Labs were unrevealing. He followed up with pulmonology who felt recent CT had shown show stability, +emphysema + radiation scarring. Concern for decreasing excess ETOH was also previously discussed. Echo 06/2022 showed EF 60-65%, G1DD, normal RV. Cardiac cath was pursued 06/2022 showing nonobstructive disease in the RCA and otherwise patent stents, mildly elevated LVEDP. Dr. Tenny Craw recommended switching diltiazem to [place] and added Jardiance. (He had not recalled being on diltiazem for any sustained arrhythmias though had a prior h/o PVCs.) At f/u 07/2022 he had not been taking the [place], had stopped the [place], and had gone back to diltiazem due to HTN. He was supposed to have gotten a BMET after our OV 07/2022 to guide med titration but not completed at that time. When seen in follow-up 11/2022, his BP was still elevated and he was back on  diltiazem. He was also not taking Plavix for unclear reasons. Dr. Tenny Craw recommended to double his [name] and added back Plavix with recommendation for patient to relay BP readings via MyChart, no message seen. At last visit 05/31/23 his blood pressure was high in the context of being out of his medicines for several days. He denied any specific barriers to care, just had not requested to refill them. He has also been undergoing workup by neurology for gait issues. Mild parkinsonism noted at recent neuro visit. His MRI demonstrated ventriculomegaly and therefore NPH on the differential.  He is seen back for follow-up overall stable without new cardiac symptoms. No new CP, SOB, edema, dizziness. He has been back on HTN meds for 4 days now. He does report that the cost of Entresto plus Jardiance plus Trelegy is taxing. We discussed potentially changing Entresto to a generic ARB. Samples of Jardiance were given.   Labwork independently reviewed: 01/2023 CBC wnl, CMET OK Cr 1.18, LFTS wnl, K ULN 5.2, trig 189, LDL 76, A1c 5.8, TSH/FT4 wnl   ROS: .    Please see the history of present illness. All other systems are reviewed and otherwise negative.  Studies Reviewed: Marland Kitchen    EKG:  EKG is not ordered today  CV Studies: Cardiac studies reviewed are outlined and summarized above. Otherwise please see EMR for full report.   Current Reported Medications:.    Current Meds  Medication Sig   albuterol (VENTOLIN HFA) 108 (90 Base) MCG/ACT inhaler Inhale 2 puffs into the  lungs every 6 (six) hours as needed for wheezing or shortness of breath.   buPROPion (WELLBUTRIN XL) 150 MG 24 hr tablet Take 1 tablet (150 mg total) by mouth daily.   Cholecalciferol (VITAMIN D) 50 MCG (2000 UT) tablet Take 2,000 Units by mouth daily.   clopidogrel (PLAVIX) 75 MG tablet Take 1 tablet (75 mg total) by mouth daily with breakfast.   diltiazem (DILACOR XR) 240 MG 24 hr capsule Take 1 capsule (240 mg total) by mouth daily.    empagliflozin (JARDIANCE) 10 MG TABS tablet Take 1 tablet (10 mg total) by mouth daily before breakfast.   escitalopram (LEXAPRO) 10 MG tablet Take 1 tablet (10 mg total) by mouth daily.   Fluticasone-Umeclidin-Vilant (TRELEGY ELLIPTA) 100-62.5-25 MCG/ACT AEPB Inhale 1 puff into the lungs daily.   gabapentin (NEURONTIN) 300 MG capsule Take 1 capsule (300 mg total) by mouth at bedtime.   metoprolol succinate (TOPROL-XL) 100 MG 24 hr tablet TAKE ONE TABLET (100MG  TOTAL) BY MOUTH DAILY. TAKE WITH OR IMMEDIATELY FOLLOWING A MEAL.   nitroGLYCERIN (NITROSTAT) 0.4 MG SL tablet Place 1 tablet (0.4 mg total) under the tongue every 5 (five) minutes as needed for chest pain.   prochlorperazine (COMPAZINE) 5 MG tablet Take 1 tablet (5 mg total) by mouth every 6 (six) hours as needed for nausea or vomiting.   rosuvastatin (CRESTOR) 40 MG tablet TAKE ONE (1) TABLET BY MOUTH EVERY DAY   sacubitril-valsartan (ENTRESTO) 49-51 MG Take 1 tablet by mouth 2 (two) times daily.   Spacer/Aero-Holding Rudean Curt Use with inhaler   traZODone (DESYREL) 50 MG tablet Take 1 tablet (50 mg total) by mouth at bedtime.     Physical Exam:    VS:  BP 130/62 (BP Location: Left Arm, Patient Position: Sitting, Cuff Size: Normal)   Pulse 78   Ht 5\' 10"  (1.778 m)   Wt 215 lb 9.6 oz (97.8 kg)   SpO2 96%   BMI 30.94 kg/m    Wt Readings from Last 3 Encounters:  06/24/23 215 lb 9.6 oz (97.8 kg)  05/31/23 220 lb (99.8 kg)  05/13/23 218 lb 12.8 oz (99.2 kg)    GEN: Well nourished, well developed in no acute distress NECK: No JVD; No carotid bruits CARDIAC: RRR, no murmurs, rubs, gallops RESPIRATORY:  Clear to auscultation without rales, wheezing or rhonchi  ABDOMEN: Soft, non-tender, non-distended EXTREMITIES:  No edema; No acute deformity   Asessement and Plan:.    1. Essential HTN - BP improved/top normal with resumption of antihypertensives. He restarted these 4 days ago. Per our discussion we will still keep the plan  for labs on Wednesday but entertain the idea of changing Entresto to solitary ARB depending on where his potassium/Cr settle back on the medication. Continue diltiazem, Toprol, Jardiance otherwise.  2. CAD, HLD - asymptomatic. Continue Plavix (maintained on monotherapy by Dr. Tenny Craw). Continue metoprolol and rosuvastatin. His last LDL was slightly above goal. When he returns for labs on  Wednesday, we'll get CMET + lipid panel (fasting).   3. Chronic HFpEF - appears euvolemic on exam. Will need to monitor fluid status in follow-up with anticipated med change above. We will continue Jardiance for now as we are able. Samples given.  4. PVCs - quiescent on present regimen. Follow clinically.    Disposition: F/u with me or Dr. Tenny Craw in 3 months  Signed, Laurann Montana, PA-C

## 2023-06-24 NOTE — Patient Instructions (Signed)
Medication Instructions:  Your physician recommends that you continue on your current medications as directed. Please refer to the Current Medication list given to you today.  *If you need a refill on your cardiac medications before your next appointment, please call your pharmacy*   Lab Work: Wednesday: -CMET -Fasting Lipid Panel  If you have labs (blood work) drawn today and your tests are completely normal, you will receive your results only by: MyChart Message (if you have MyChart) OR A paper copy in the mail If you have any lab test that is abnormal or we need to change your treatment, we will call you to review the results.   Testing/Procedures: None   Follow-Up: At [facility], you and your health needs are our priority.  As part of our continuing mission to provide you with exceptional heart care, we have created designated Provider Care Teams.  These Care Teams include your primary Cardiologist (physician) and Advanced Practice Providers (APPs -  Physician Assistants and Nurse Practitioners) who all work together to provide you with the care you need, when you need it.  We recommend signing up for the patient portal called "MyChart".  Sign up information is provided on this After Visit Summary.  MyChart is used to connect with patients for Virtual Visits (Telemedicine).  Patients are able to view lab/test results, encounter notes, upcoming appointments, etc.  Non-urgent messages can be sent to your provider as well.   To learn more about what you can do with MyChart, go to [email or web address].    Your next appointment:   3 month(s)  Provider:   You may see Dietrich Pates, MD or one of the following Advanced Practice Providers on your designated Care Team:   Ronie Spies, [place]  Other Instructions

## 2023-06-25 ENCOUNTER — Encounter (HOSPITAL_COMMUNITY): Payer: HMO

## 2023-06-27 ENCOUNTER — Encounter (HOSPITAL_COMMUNITY): Payer: HMO

## 2023-06-27 ENCOUNTER — Other Ambulatory Visit (HOSPITAL_COMMUNITY)
Admission: RE | Admit: 2023-06-27 | Discharge: 2023-06-27 | Disposition: A | Discharge status: Home / Self Care | Payer: HMO | Source: Ambulatory Visit | Attending: Physician Assistant | Admitting: Physician Assistant

## 2023-06-27 ENCOUNTER — Telehealth: Payer: Self-pay

## 2023-06-27 DIAGNOSIS — I10 Essential (primary) hypertension: Secondary | ICD-10-CM | POA: Insufficient documentation

## 2023-06-27 DIAGNOSIS — I2510 Atherosclerotic heart disease of native coronary artery without angina pectoris: Secondary | ICD-10-CM | POA: Diagnosis not present

## 2023-06-27 DIAGNOSIS — E785 Hyperlipidemia, unspecified: Secondary | ICD-10-CM | POA: Diagnosis not present

## 2023-06-27 DIAGNOSIS — I493 Ventricular premature depolarization: Secondary | ICD-10-CM | POA: Diagnosis not present

## 2023-06-27 DIAGNOSIS — I5022 Chronic systolic (congestive) heart failure: Secondary | ICD-10-CM | POA: Insufficient documentation

## 2023-06-27 LAB — COMPREHENSIVE METABOLIC PANEL
ALT: 17 U/L (ref 0–44)
AST: 15 U/L (ref 15–41)
Albumin: 3.9 g/dL (ref 3.5–5.0)
Alkaline Phosphatase: 71 U/L (ref 38–126)
Anion gap: 11 (ref 5–15)
BUN: 16 mg/dL (ref 8–23)
CO2: 23 mmol/L (ref 22–32)
Calcium: 9.1 mg/dL (ref 8.9–10.3)
Chloride: 105 mmol/L (ref 98–111)
Creatinine, Ser: 1.15 mg/dL (ref 0.61–1.24)
GFR, Estimated: >60 mL/min (ref >60)
Glucose, Bld: 91 mg/dL (ref 70–99)
Potassium: 4.1 mmol/L (ref 3.5–5.1)
Sodium: 139 mmol/L (ref 135–145)
Total Bilirubin: 0.5 mg/dL (ref 0.3–1.2)
Total Protein: 6.9 g/dL (ref 6.5–8.1)

## 2023-06-27 LAB — LIPID PANEL
Cholesterol: 122 mg/dL (ref 0–200)
HDL: 51 mg/dL (ref >40)
LDL Cholesterol: 42 mg/dL (ref 0–99)
Total CHOL/HDL Ratio: 2.4 RATIO
Triglycerides: 146 mg/dL (ref <150)
VLDL: 29 mg/dL (ref 0–40)

## 2023-06-27 NOTE — Telephone Encounter (Signed)
Patient did not answer, and voicemail was full.

## 2023-06-27 NOTE — Telephone Encounter (Signed)
-----   Message from Laurann Montana sent at 06/27/2023  4:31 PM EDT ----- Labs are stable. Lipids look good. Per our discussion will transition off Entresto onto ARB due to cost. Plan: - Stop Entresto - Start irbesartan 300mg  daily, can start 12 hours after last dose of Entresto, but then remember this is only ONCE daily  - Recheck BMET 1 week - Notify if BP begins trending <110 on regular basis in the first days of taking med - Keep BP log and be ready to relay readings when we tell him his BMET results

## 2023-06-27 NOTE — Telephone Encounter (Signed)
Called the patient and was able to discuss the results in more detail and answer his questions. He would like to move forward with the lumbar puncture. Advised we will place this order for the patient and for him to be on the lookout for a call from [facility] imaging. Pt verbalized understanding. Pt had no questions at this time but was encouraged to call back if questions arise.

## 2023-06-28 ENCOUNTER — Telehealth: Payer: Self-pay

## 2023-06-28 DIAGNOSIS — Z79899 Other long term (current) drug therapy: Secondary | ICD-10-CM

## 2023-06-28 MED ORDER — IRBESARTAN 300 MG PO TABS: 300.0000 mg | ORAL_TABLET | Freq: Every day | ORAL | 3 refills | Status: DC

## 2023-06-28 NOTE — Telephone Encounter (Signed)
Patient notified and verbalized understanding. Pt had no questions or concerns at this time

## 2023-06-28 NOTE — Telephone Encounter (Signed)
-----   Message from Nurse Deon Pilling sent at 06/27/2023  4:37 PM EDT ----- Jeremy Lopez to call patient, no answer and no voicemail to leave message.

## 2023-06-28 NOTE — Telephone Encounter (Signed)
Please see previous note.

## 2023-06-28 NOTE — Telephone Encounter (Signed)
Laurann Montana, PA-C 06/27/2023  4:31 PM EDT     Labs are stable. Lipids look good. Per our discussion will transition off Entresto onto ARB due to cost. Plan: - Stop Entresto - Start irbesartan 300mg  daily, can start 12 hours after last dose of Entresto, but then remember this is only ONCE daily - Recheck BMET 1 week - Notify if BP begins trending <110 on regular basis in the first days of taking med - Keep BP log and be ready to relay readings when we tell him his BMET results

## 2023-07-02 ENCOUNTER — Encounter (HOSPITAL_COMMUNITY): Payer: HMO

## 2023-07-04 ENCOUNTER — Encounter (HOSPITAL_COMMUNITY): Payer: HMO

## 2023-07-04 NOTE — Telephone Encounter (Signed)
See mychart message, pt aware waiting on Dr Pershing Proud to sign off the order for the pt

## 2023-07-04 NOTE — Telephone Encounter (Signed)
Sent to Deatra James at GI to schedule [phone]

## 2023-07-04 NOTE — Telephone Encounter (Signed)
Pt said called GI to schedule LP. They said they do not have a referral for a LP. Would like a call back

## 2023-07-05 ENCOUNTER — Emergency Department (HOSPITAL_COMMUNITY): Payer: HMO

## 2023-07-05 ENCOUNTER — Other Ambulatory Visit: Payer: Self-pay

## 2023-07-05 ENCOUNTER — Ambulatory Visit (HOSPITAL_COMMUNITY): Payer: HMO

## 2023-07-05 ENCOUNTER — Ambulatory Visit: Payer: HMO | Admitting: Internal Medicine

## 2023-07-05 ENCOUNTER — Encounter (HOSPITAL_COMMUNITY): Payer: Self-pay | Admitting: *Deleted

## 2023-07-05 ENCOUNTER — Inpatient Hospital Stay (HOSPITAL_COMMUNITY)
Admission: EM | Admit: 2023-07-05 | Discharge: 2023-07-13 | DRG: 065 | Disposition: A | Discharge status: Rehabilitation Facility | Payer: HMO | Attending: Family Medicine | Admitting: Family Medicine

## 2023-07-05 DIAGNOSIS — E782 Mixed hyperlipidemia: Secondary | ICD-10-CM | POA: Diagnosis not present

## 2023-07-05 DIAGNOSIS — I5032 Chronic diastolic (congestive) heart failure: Secondary | ICD-10-CM | POA: Diagnosis present

## 2023-07-05 DIAGNOSIS — G912 (Idiopathic) normal pressure hydrocephalus: Secondary | ICD-10-CM | POA: Diagnosis not present

## 2023-07-05 DIAGNOSIS — G9389 Other specified disorders of brain: Secondary | ICD-10-CM | POA: Diagnosis not present

## 2023-07-05 DIAGNOSIS — I639 Cerebral infarction, unspecified: Secondary | ICD-10-CM | POA: Diagnosis not present

## 2023-07-05 DIAGNOSIS — R29818 Other symptoms and signs involving the nervous system: Secondary | ICD-10-CM | POA: Diagnosis not present

## 2023-07-05 DIAGNOSIS — Z87891 Personal history of nicotine dependence: Secondary | ICD-10-CM | POA: Diagnosis not present

## 2023-07-05 DIAGNOSIS — Z7141 Alcohol abuse counseling and surveillance of alcoholic: Secondary | ICD-10-CM

## 2023-07-05 DIAGNOSIS — Z23 Encounter for immunization: Secondary | ICD-10-CM | POA: Diagnosis not present

## 2023-07-05 DIAGNOSIS — Z881 Allergy status to other antibiotic agents status: Secondary | ICD-10-CM

## 2023-07-05 DIAGNOSIS — Z888 Allergy status to other drugs, medicaments and biological substances status: Secondary | ICD-10-CM

## 2023-07-05 DIAGNOSIS — I2510 Atherosclerotic heart disease of native coronary artery without angina pectoris: Secondary | ICD-10-CM | POA: Diagnosis present

## 2023-07-05 DIAGNOSIS — I6501 Occlusion and stenosis of right vertebral artery: Secondary | ICD-10-CM | POA: Diagnosis not present

## 2023-07-05 DIAGNOSIS — R42 Dizziness and giddiness: Secondary | ICD-10-CM | POA: Diagnosis not present

## 2023-07-05 DIAGNOSIS — Z7984 Long term (current) use of oral hypoglycemic drugs: Secondary | ICD-10-CM

## 2023-07-05 DIAGNOSIS — Z66 Do not resuscitate: Secondary | ICD-10-CM | POA: Diagnosis present

## 2023-07-05 DIAGNOSIS — Z955 Presence of coronary angioplasty implant and graft: Secondary | ICD-10-CM | POA: Diagnosis not present

## 2023-07-05 DIAGNOSIS — R531 Weakness: Secondary | ICD-10-CM | POA: Diagnosis not present

## 2023-07-05 DIAGNOSIS — I10 Essential (primary) hypertension: Secondary | ICD-10-CM | POA: Diagnosis not present

## 2023-07-05 DIAGNOSIS — I110 Hypertensive heart disease with heart failure: Secondary | ICD-10-CM | POA: Diagnosis present

## 2023-07-05 DIAGNOSIS — F1010 Alcohol abuse, uncomplicated: Secondary | ICD-10-CM | POA: Diagnosis not present

## 2023-07-05 DIAGNOSIS — R519 Headache, unspecified: Secondary | ICD-10-CM | POA: Insufficient documentation

## 2023-07-05 DIAGNOSIS — R29700 NIHSS score 0: Secondary | ICD-10-CM | POA: Diagnosis present

## 2023-07-05 DIAGNOSIS — I63549 Cerebral infarction due to unspecified occlusion or stenosis of unspecified cerebellar artery: Principal | ICD-10-CM | POA: Diagnosis present

## 2023-07-05 DIAGNOSIS — G4489 Other headache syndrome: Secondary | ICD-10-CM | POA: Diagnosis not present

## 2023-07-05 DIAGNOSIS — R2681 Unsteadiness on feet: Secondary | ICD-10-CM | POA: Diagnosis not present

## 2023-07-05 DIAGNOSIS — I672 Cerebral atherosclerosis: Secondary | ICD-10-CM | POA: Diagnosis not present

## 2023-07-05 DIAGNOSIS — Z79899 Other long term (current) drug therapy: Secondary | ICD-10-CM

## 2023-07-05 DIAGNOSIS — Z7902 Long term (current) use of antithrombotics/antiplatelets: Secondary | ICD-10-CM | POA: Diagnosis not present

## 2023-07-05 DIAGNOSIS — E669 Obesity, unspecified: Secondary | ICD-10-CM | POA: Diagnosis not present

## 2023-07-05 DIAGNOSIS — G8191 Hemiplegia, unspecified affecting right dominant side: Secondary | ICD-10-CM | POA: Diagnosis present

## 2023-07-05 DIAGNOSIS — Z6830 Body mass index (BMI) 30.0-30.9, adult: Secondary | ICD-10-CM

## 2023-07-05 DIAGNOSIS — F329 Major depressive disorder, single episode, unspecified: Secondary | ICD-10-CM | POA: Diagnosis not present

## 2023-07-05 DIAGNOSIS — I63211 Cerebral infarction due to unspecified occlusion or stenosis of right vertebral arteries: Secondary | ICD-10-CM

## 2023-07-05 DIAGNOSIS — Z9221 Personal history of antineoplastic chemotherapy: Secondary | ICD-10-CM

## 2023-07-05 DIAGNOSIS — J4489 Other specified chronic obstructive pulmonary disease: Secondary | ICD-10-CM | POA: Diagnosis present

## 2023-07-05 DIAGNOSIS — H9201 Otalgia, right ear: Principal | ICD-10-CM | POA: Diagnosis present

## 2023-07-05 DIAGNOSIS — Z7951 Long term (current) use of inhaled steroids: Secondary | ICD-10-CM

## 2023-07-05 DIAGNOSIS — Z85118 Personal history of other malignant neoplasm of bronchus and lung: Secondary | ICD-10-CM

## 2023-07-05 DIAGNOSIS — R262 Difficulty in walking, not elsewhere classified: Secondary | ICD-10-CM | POA: Diagnosis present

## 2023-07-05 DIAGNOSIS — Z8249 Family history of ischemic heart disease and other diseases of the circulatory system: Secondary | ICD-10-CM | POA: Diagnosis not present

## 2023-07-05 DIAGNOSIS — I6523 Occlusion and stenosis of bilateral carotid arteries: Secondary | ICD-10-CM | POA: Diagnosis not present

## 2023-07-05 DIAGNOSIS — Z923 Personal history of irradiation: Secondary | ICD-10-CM

## 2023-07-05 DIAGNOSIS — R269 Unspecified abnormalities of gait and mobility: Secondary | ICD-10-CM | POA: Diagnosis not present

## 2023-07-05 HISTORY — DX: Cerebral infarction, unspecified: I63.9

## 2023-07-05 LAB — CBC
HCT: 43.8 % (ref 39.0–52.0)
Hemoglobin: 14.6 g/dL (ref 13.0–17.0)
MCH: 30.5 pg (ref 26.0–34.0)
MCHC: 33.3 g/dL (ref 30.0–36.0)
MCV: 91.6 fL (ref 80.0–100.0)
Platelets: 339 10*3/uL (ref 150–400)
RBC: 4.78 MIL/uL (ref 4.22–5.81)
RDW: 14.3 % (ref 11.5–15.5)
WBC: 10.8 10*3/uL — ABNORMAL HIGH (ref 4.0–10.5)
nRBC: 0 % (ref 0.0–0.2)

## 2023-07-05 LAB — URINALYSIS, ROUTINE W REFLEX MICROSCOPIC
Bacteria, UA: NONE SEEN
Bilirubin Urine: NEGATIVE
Glucose, UA: >=500 mg/dL — AB
Hgb urine dipstick: NEGATIVE
Ketones, ur: 20 mg/dL — AB
Leukocytes,Ua: NEGATIVE
Nitrite: NEGATIVE
Protein, ur: 30 mg/dL — AB
Specific Gravity, Urine: 1.033 — ABNORMAL HIGH (ref 1.005–1.030)
pH: 5 (ref 5.0–8.0)

## 2023-07-05 LAB — RAPID URINE DRUG SCREEN, HOSP PERFORMED
Amphetamines: NOT DETECTED
Barbiturates: NOT DETECTED
Benzodiazepines: NOT DETECTED
Cocaine: NOT DETECTED
Opiates: NOT DETECTED
Tetrahydrocannabinol: NOT DETECTED

## 2023-07-05 LAB — DIFFERENTIAL
Abs Immature Granulocytes: 0.05 10*3/uL (ref 0.00–0.07)
Basophils Absolute: 0 10*3/uL (ref 0.0–0.1)
Basophils Relative: 0 %
Eosinophils Absolute: 0 10*3/uL (ref 0.0–0.5)
Eosinophils Relative: 0 %
Immature Granulocytes: 1 %
Lymphocytes Relative: 7 %
Lymphs Abs: 0.7 10*3/uL (ref 0.7–4.0)
Monocytes Absolute: 0.8 10*3/uL (ref 0.1–1.0)
Monocytes Relative: 7 %
Neutro Abs: 9.2 10*3/uL — ABNORMAL HIGH (ref 1.7–7.7)
Neutrophils Relative %: 85 %

## 2023-07-05 LAB — ETHANOL: Alcohol, Ethyl (B): <10 mg/dL (ref <10)

## 2023-07-05 LAB — COMPREHENSIVE METABOLIC PANEL
ALT: 15 U/L (ref 0–44)
AST: 12 U/L — ABNORMAL LOW (ref 15–41)
Albumin: 3.8 g/dL (ref 3.5–5.0)
Alkaline Phosphatase: 69 U/L (ref 38–126)
Anion gap: 11 (ref 5–15)
BUN: 16 mg/dL (ref 8–23)
CO2: 22 mmol/L (ref 22–32)
Calcium: 8.9 mg/dL (ref 8.9–10.3)
Chloride: 101 mmol/L (ref 98–111)
Creatinine, Ser: 1.06 mg/dL (ref 0.61–1.24)
GFR, Estimated: >60 mL/min (ref >60)
Glucose, Bld: 110 mg/dL — ABNORMAL HIGH (ref 70–99)
Potassium: 3.6 mmol/L (ref 3.5–5.1)
Sodium: 134 mmol/L — ABNORMAL LOW (ref 135–145)
Total Bilirubin: 0.8 mg/dL (ref 0.3–1.2)
Total Protein: 6.8 g/dL (ref 6.5–8.1)

## 2023-07-05 LAB — LIPID PANEL
Cholesterol: 107 mg/dL (ref 0–200)
HDL: 48 mg/dL (ref >40)
LDL Cholesterol: 38 mg/dL (ref 0–99)
Total CHOL/HDL Ratio: 2.2 {ratio}
Triglycerides: 104 mg/dL (ref <150)
VLDL: 21 mg/dL (ref 0–40)

## 2023-07-05 LAB — PROTIME-INR
INR: 1 (ref 0.8–1.2)
Prothrombin Time: 13.5 s (ref 11.4–15.2)

## 2023-07-05 LAB — APTT: aPTT: 29 s (ref 24–36)

## 2023-07-05 MED ORDER — TRAZODONE HCL 50 MG PO TABS
50.0000 mg | ORAL_TABLET | Freq: Every day | ORAL | Status: DC
  Administered 2023-07-06 – 2023-07-12 (×8): 50 mg via ORAL
  Filled 2023-07-05 (×8): qty 1

## 2023-07-05 MED ORDER — ENOXAPARIN SODIUM 40 MG/0.4ML IJ SOSY
40.0000 mg | PREFILLED_SYRINGE | INTRAMUSCULAR | Status: DC
  Administered 2023-07-06 – 2023-07-13 (×8): 40 mg via SUBCUTANEOUS
  Filled 2023-07-05 (×8): qty 0.4

## 2023-07-05 MED ORDER — CLOPIDOGREL BISULFATE 75 MG PO TABS
75.0000 mg | ORAL_TABLET | Freq: Every day | ORAL | Status: DC
  Administered 2023-07-06 – 2023-07-13 (×8): 75 mg via ORAL
  Filled 2023-07-05 (×8): qty 1

## 2023-07-05 MED ORDER — INFLUENZA VAC A&B SURF ANT ADJ 0.5 ML IM SUSY
0.5000 mL | PREFILLED_SYRINGE | INTRAMUSCULAR | Status: AC
  Administered 2023-07-07: 0.5 mL via INTRAMUSCULAR
  Filled 2023-07-05: qty 0.5

## 2023-07-05 MED ORDER — IOHEXOL 350 MG/ML SOLN
75.0000 mL | Freq: Once | INTRAVENOUS | Status: AC | PRN
  Administered 2023-07-05: 75 mL via INTRAVENOUS

## 2023-07-05 MED ORDER — ACETAMINOPHEN 325 MG PO TABS
650.0000 mg | ORAL_TABLET | Freq: Four times a day (QID) | ORAL | Status: DC | PRN
  Administered 2023-07-05 – 2023-07-13 (×5): 650 mg via ORAL
  Filled 2023-07-05 (×5): qty 2

## 2023-07-05 MED ORDER — UMECLIDINIUM BROMIDE 62.5 MCG/ACT IN AEPB
1.0000 | INHALATION_SPRAY | Freq: Every day | RESPIRATORY_TRACT | Status: DC
  Administered 2023-07-06 – 2023-07-13 (×8): 1 via RESPIRATORY_TRACT
  Filled 2023-07-05 (×2): qty 7

## 2023-07-05 MED ORDER — ASPIRIN 81 MG PO TBEC
81.0000 mg | DELAYED_RELEASE_TABLET | Freq: Every day | ORAL | Status: DC
  Administered 2023-07-05 – 2023-07-13 (×9): 81 mg via ORAL
  Filled 2023-07-05 (×9): qty 1

## 2023-07-05 MED ORDER — SENNOSIDES-DOCUSATE SODIUM 8.6-50 MG PO TABS
1.0000 | ORAL_TABLET | Freq: Every evening | ORAL | Status: DC | PRN
  Administered 2023-07-09: 1 via ORAL
  Filled 2023-07-05 (×2): qty 1

## 2023-07-05 MED ORDER — ROSUVASTATIN CALCIUM 20 MG PO TABS
40.0000 mg | ORAL_TABLET | Freq: Every day | ORAL | Status: DC
  Administered 2023-07-06 – 2023-07-13 (×8): 40 mg via ORAL
  Filled 2023-07-05 (×8): qty 2

## 2023-07-05 MED ORDER — ESCITALOPRAM OXALATE 10 MG PO TABS
10.0000 mg | ORAL_TABLET | Freq: Every day | ORAL | Status: DC
  Administered 2023-07-06 – 2023-07-13 (×8): 10 mg via ORAL
  Filled 2023-07-05 (×8): qty 1

## 2023-07-05 MED ORDER — STROKE: EARLY STAGES OF RECOVERY BOOK
Freq: Once | Status: AC
  Filled 2023-07-05: qty 1

## 2023-07-05 MED ORDER — FLUTICASONE FUROATE-VILANTEROL 100-25 MCG/ACT IN AEPB
1.0000 | INHALATION_SPRAY | Freq: Every day | RESPIRATORY_TRACT | Status: DC
  Administered 2023-07-06 – 2023-07-13 (×8): 1 via RESPIRATORY_TRACT
  Filled 2023-07-05: qty 28

## 2023-07-05 MED ORDER — GABAPENTIN 300 MG PO CAPS
300.0000 mg | ORAL_CAPSULE | Freq: Every day | ORAL | Status: DC
  Administered 2023-07-06 – 2023-07-12 (×8): 300 mg via ORAL
  Filled 2023-07-05 (×8): qty 1

## 2023-07-05 NOTE — ED Notes (Signed)
[facility] on unit for transport to [facility]

## 2023-07-05 NOTE — Discharge Instructions (Signed)
Make an appointment to follow-up with your nose and throat.  Make an appointment to follow back up with [facility] neurology.

## 2023-07-05 NOTE — Assessment & Plan Note (Signed)
-  Holding cardizem and metoprolol for permissive HTN

## 2023-07-05 NOTE — Consult Note (Signed)
NEUROLOGY H&P NOTE   Date of service: July 05, 2023 Patient Name: Jeremy Lopez MRN:  [id number] DOB:  1950/12/25 Chief Complaint: "R cerebellum stroke"  History of Present Illness  Jeremy Lopez is a 72 y.o. male CAD, EtOh use, HTN, HLD who presents with episode of dysequilibrium. Reports that he got up in the middle of night to use bathroom around 2AM. He was fine. Then got up around 4AM and his R leg was weak, he felt off balance and he had headache, going to right side of his face and numbness in BL uppers. The tingling went away in 10 mins.  He was unable to walk in AM. His daughter and son in law convinced him to come to the ED. They had significant trouble getting him downstairs and then called EMS.  He was brought in to the ED where MRI Brain demonstrated R cerebellum stroke.  He has been following with neurology. Saw Dr. Marjory Lies in August of 2024 where he was noted to have some shuffling gait and concern for NPH. He has been scheduled for LP next week.  Last known well: 0200 Modified rankin score: 1-No significant post stroke disability and can perform usual duties with stroke symptoms tNKASE: not offered, too mild to treat Thrombectomy: not offered, too mild to treat. NIHSS components Score: Comment  1a Level of Conscious 0[x]  1[]  2[]  3[]      1b LOC Questions 0[x]  1[]  2[]       1c LOC Commands 0[x]  1[]  2[]       2 Best Gaze 0[x]  1[]  2[]       3 Visual 0[x]  1[]  2[]  3[]      4 Facial Palsy 0[x]  1[]  2[]  3[]      5a Motor Arm - left 0[x]  1[]  2[]  3[]  4[]  UN[]    5b Motor Arm - Right 0[x]  1[]  2[]  3[]  4[]  UN[]    6a Motor Leg - Left 0[x]  1[]  2[]  3[]  4[]  UN[]    6b Motor Leg - Right 0[x]  1[]  2[]  3[]  4[]  UN[]    7 Limb Ataxia 0[x]  1[]  2[]  3[]  UN[]     8 Sensory 0[x]  1[]  2[]  UN[]      9 Best Language 0[x]  1[]  2[]  3[]      10 Dysarthria 0[x]  1[]  2[]  UN[]      11 Extinct. and Inattention 0[x]  1[]  2[]       TOTAL: 0      ROS   Constitutional Denies weight loss, fever and chills.    HEENT Denies changes in vision and hearing.   Respiratory Denies SOB and cough.   CV Denies palpitations and CP   GI Denies abdominal pain, nausea, vomiting and diarrhea.   GU Denies dysuria and urinary frequency.   MSK Denies myalgia and joint pain.   Skin Denies rash and pruritus.   Neurological Denies headache and syncope.   Psychiatric Denies recent changes in mood. Denies anxiety and depression.     Past History   Past Medical History:  Diagnosis Date   CAD (coronary artery disease), native coronary artery    DES proximal LAD 07/2009; DES to RCA 04/2020   Chronic heart failure with preserved ejection fraction (HFpEF) (HCC)    Essential hypertension    Habitual alcohol use    Hyperlipidemia    PVC's (premature ventricular contractions)    Small cell lung cancer (HCC)    Right upper lobe 2008; no evidence of disease since treatment ended in 11/2007   Past Surgical History:  Procedure Laterality Date   CORONARY ATHERECTOMY N/A  04/26/2020   Procedure: CORONARY ATHERECTOMY;  Surgeon: Corky Crafts, MD;  Location: [facility] INVASIVE CV LAB;  Service: Cardiovascular;  Laterality: N/A;   CORONARY STENT INTERVENTION N/A 04/26/2020   Procedure: CORONARY STENT INTERVENTION;  Surgeon: Corky Crafts, MD;  Location: [facility] INVASIVE CV LAB;  Service: Cardiovascular;  Laterality: N/A;   CORONARY ULTRASOUND/IVUS N/A 04/26/2020   Procedure: Intravascular Ultrasound/IVUS;  Surgeon: Corky Crafts, MD;  Location: [facility] INVASIVE CV LAB;  Service: Cardiovascular;  Laterality: N/A;   Insertion of left subclavian Port-A-Cath  2008   LEFT HEART CATH AND CORONARY ANGIOGRAPHY N/A 07/03/2022   Procedure: LEFT HEART CATH AND CORONARY ANGIOGRAPHY;  Surgeon: Swaziland, Peter M, MD;  Location: [facility] INVASIVE CV LAB;  Service: Cardiovascular;  Laterality: N/A;   RIGHT/LEFT HEART CATH AND CORONARY ANGIOGRAPHY N/A 04/26/2020   Procedure: RIGHT/LEFT HEART CATH AND CORONARY ANGIOGRAPHY;  Surgeon: Corky Crafts, MD;  Location: [facility] INVASIVE CV LAB;  Service: Cardiovascular;  Laterality: N/A;   TEMPORARY PACEMAKER N/A 04/26/2020   Procedure: TEMPORARY PACEMAKER;  Surgeon: Corky Crafts, MD;  Location: [facility] INVASIVE CV LAB;  Service: Cardiovascular;  Laterality: N/A;   Video bronchoscopy and video mediastinoscopy  2008   Family History  Problem Relation Age of Onset   Cancer Father    CAD Brother    Hypertension Brother    Depression Maternal Uncle    Alcohol abuse Maternal Uncle    Social History   Socioeconomic History   Marital status: Married    Spouse name: [name]   Number of children: 2   Years of education: 14   Highest education level: Tax adviser degree: occupational, Scientist, product/process development, or vocational program  Occupational History   Occupation: Full time    Comment: Industrial/product designer for [employer]   Occupation: Retired Industrial/product designer    Comment: [employer]  Tobacco Use   Smoking status: Former    Current packs/day: 0.00    Types: Cigarettes    Start date: 10/25/1966    Quit date: 10/25/1998    Years since quitting: 24.7   Smokeless tobacco: Never  Vaping Use   Vaping status: Never Used  Substance and Sexual Activity   Alcohol use: Yes    Alcohol/week: 14.0 standard drinks of alcohol    Types: 14 Cans of beer per week    Comment: 1 drink per day   Drug use: Yes    Types: Marijuana   Sexual activity: Yes  Other Topics Concern   Not on file  Social History Narrative   Lives in [place] with wife. Enjoys reading, playing golf in free time.    Social Determinants of Health   Financial Resource Strain: Not on file  Food Insecurity: Not on file  Transportation Needs: Not on file  Physical Activity: Not on file  Stress: Not on file  Social Connections: Not on file   Allergies  Allergen Reactions   Cephalexin Anaphylaxis    throat swelling Patient said he can take amoxicillin   Amlodipine Swelling    Leg swelling    Medications   Medications Prior to Admission   Medication Sig Dispense Refill Last Dose   acetaminophen (TYLENOL) 500 MG tablet Take 500 mg by mouth every 6 (six) hours as needed for moderate pain.   07/04/2023 at 1800   albuterol (VENTOLIN HFA) 108 (90 Base) MCG/ACT inhaler Inhale 2 puffs into the lungs every 6 (six) hours as needed for wheezing or shortness of breath.   unknown   Cholecalciferol (VITAMIN D)  50 MCG (2000 UT) tablet Take 2,000 Units by mouth daily.   Past Week   clopidogrel (PLAVIX) 75 MG tablet Take 1 tablet (75 mg total) by mouth daily with breakfast. 90 tablet 3 07/04/2023 at 2200   diltiazem (CARDIZEM CD) 240 MG 24 hr capsule Take 240 mg by mouth daily.   07/04/2023   empagliflozin (JARDIANCE) 10 MG TABS tablet Take 1 tablet (10 mg total) by mouth daily before breakfast. 30 tablet 11 07/04/2023   escitalopram (LEXAPRO) 10 MG tablet Take 1 tablet (10 mg total) by mouth daily. 30 tablet 1 07/04/2023   Fluticasone-Umeclidin-Vilant (TRELEGY ELLIPTA) 100-62.5-25 MCG/ACT AEPB Inhale 1 puff into the lungs daily. 180 each 3 07/05/2023   gabapentin (NEURONTIN) 300 MG capsule Take 1 capsule (300 mg total) by mouth at bedtime. 90 capsule 3 Past Month   metoprolol succinate (TOPROL-XL) 100 MG 24 hr tablet TAKE ONE TABLET (100MG  TOTAL) BY MOUTH DAILY. TAKE WITH OR IMMEDIATELY FOLLOWING A MEAL. 90 tablet 2 07/04/2023   nitroGLYCERIN (NITROSTAT) 0.4 MG SL tablet Place 1 tablet (0.4 mg total) under the tongue every 5 (five) minutes as needed for chest pain. 25 tablet 2 unknown   prochlorperazine (COMPAZINE) 5 MG tablet Take 1 tablet (5 mg total) by mouth every 6 (six) hours as needed for nausea or vomiting. 30 tablet 0 07/05/2023   rosuvastatin (CRESTOR) 40 MG tablet TAKE ONE (1) TABLET BY MOUTH EVERY DAY 90 tablet 3 07/04/2023   traZODone (DESYREL) 50 MG tablet Take 1 tablet (50 mg total) by mouth at bedtime. 90 tablet 1 07/03/2023     Vitals   Vitals:   07/05/23 1700 07/05/23 2119 07/05/23 2130 07/05/23 2330  BP: (!) 151/81 (!) 177/78 (!)  183/78 (!) 152/72  Pulse:  67 67 68  Resp: (!) 23 20 13 18   Temp:  97.6 F (36.4 C)  97.6 F (36.4 C)  TempSrc:  Oral  Oral  SpO2:  95% 96%   Weight:    97.6 kg  Height:    5\' 10"  (1.778 m)     Body mass index is 30.87 kg/m.  Physical Exam   Constitutional: Appears well-developed and well-nourished.  Psych: Affect appropriate to situation.  Eyes: No scleral injection.  HENT: No OP obstrucion.  Head: Normocephalic.  Cardiovascular: Normal rate and regular rhythm.  Respiratory: Effort normal, non-labored breathing.  GI: Soft.  No distension. There is no tenderness.  Skin: WDI.    Neurologic Examination  Mental status/Cognition: Alert, oriented to self, place, month and year, good attention.  Speech/language: Fluent, comprehension intact, object naming intact, repetition intact.  Cranial nerves:   CN II Pupils equal and reactive to light, no VF deficits    CN III,IV,VI EOM intact, no gaze preference or deviation, no nystagmus    CN V normal sensation in V1, V2, and V3 segments bilaterally    CN VII no asymmetry, no nasolabial fold flattening    CN VIII normal hearing to speech    CN IX & X normal palatal elevation, no uvular deviation    CN XI 5/5 head turn and 5/5 shoulder shrug bilaterally    CN XII midline tongue protrusion   Motor:  Muscle bulk: normal, tone normal, pronator drift none tremor none Mvmt Root Nerve  Muscle Right Left Comments  SA C5/6 Ax Deltoid 5 5   EF C5/6 Mc Biceps 5 5   EE C6/7/8 Rad Triceps 5 5   WF C6/7 Med FCR     WE C7/8  PIN ECU     F Ab C8/T1 U ADM/FDI 5 5   HF L1/2/3 Fem Illopsoas 5 5   KE L2/3/4 Fem Quad 5 5   DF L4/5 D Peron Tib Ant 5 5   PF S1/2 Tibial Grc/Sol 5 5    Sensation:  Light touch Intact throughout   Pin prick    Temperature    Vibration   Proprioception    Coordination/Complex Motor:  - Finger to Nose intact BL - Heel to shin intact BL - Rapid alternating movement are normal - Gait: deferred.   Labs   CBC:   Recent Labs  Lab 07/05/23 1543  WBC 10.8*  NEUTROABS 9.2*  HGB 14.6  HCT 43.8  MCV 91.6  PLT 339    Basic Metabolic Panel:  Lab Results  Component Value Date   NA 134 (L) 07/05/2023   K 3.6 07/05/2023   CO2 22 07/05/2023   GLUCOSE 110 (H) 07/05/2023   BUN 16 07/05/2023   CREATININE 1.06 07/05/2023   CALCIUM 8.9 07/05/2023   GFRNONAA >60 07/05/2023   GFRAA >60 04/19/2020   Lipid Panel:  Lab Results  Component Value Date   LDLCALC 38 07/05/2023   HgbA1c:  Lab Results  Component Value Date   HGBA1C 5.8 (H) 01/18/2023   Urine Drug Screen:     Component Value Date/Time   LABOPIA NONE DETECTED 07/05/2023 2013   COCAINSCRNUR NONE DETECTED 07/05/2023 2013   LABBENZ NONE DETECTED 07/05/2023 2013   AMPHETMU NONE DETECTED 07/05/2023 2013   THCU NONE DETECTED 07/05/2023 2013   LABBARB NONE DETECTED 07/05/2023 2013    Alcohol Level     Component Value Date/Time   ETH <10 07/05/2023 1543   INR  Lab Results  Component Value Date   INR 1.0 07/05/2023   APTT  Lab Results  Component Value Date   APTT 29 07/05/2023     CT Head without contrast(Personally reviewed): CTH was negative for a large hypodensity concerning for a large territory infarct or hyperdensity concerning for an ICH  CT angio Head and Neck with contrast(Personally reviewed): No LVO  MRI Brain(Personally reviewed): R cerebellum stroke  Impression   Jeremy Lopez is a 72 y.o. male dysequlibrium and found to have R cerebellum stroke. Neuro exam with mild truncal ataxia.  Primary Diagnosis:  Cerebral infarction, unspecified.  Recommendations  - Frequent Neuro checks per stroke unit protocol - Recommend obtaining TTE - Recommend obtaining Lipid panel with LDL - Please start statin if LDL > 70 - Recommend HbA1c to evaluate for diabetes and how well it is controlled. - Antithrombotic - Aspirin 81mg  daily along with plavix 75mg  daily, followed by aspirin 81mg  daily alone. - Recommend DVT  ppx - SBP goal - permissive hypertension first 24 h < 220/110. Held home meds.  - Recommend Telemetry monitoring for arrythmia - Recommend bedside swallow screen prior to PO intake. - Stroke education booklet - Recommend PT/OT/SLP consult ______________________________________________________________________   Welton Flakes

## 2023-07-05 NOTE — Assessment & Plan Note (Signed)
-  continue asa, plavix, statin -Hold metoprolol given permissive HTN

## 2023-07-05 NOTE — ED Triage Notes (Signed)
Pt BIB [facility] for c/o weakness to right side; pt states he started out with ear pain x 2 days ago; pt states today he woke up and is unable to walk; EMS reports pt was unable to use his right leg   NIHSS 0 during triage

## 2023-07-05 NOTE — Assessment & Plan Note (Signed)
Continue tylenol. Continue to monitor.

## 2023-07-05 NOTE — ED Notes (Signed)
ED TO INPATIENT HANDOFF REPORT  ED Nurse Name and Phone #:   S Name/Age/Gender Jeremy Lopez 72 y.o. male Room/Bed: APA08/APA08  Code Status   Code Status: Prior  Home/SNF/Other Home Patient oriented to: self, place, time, and situation Is this baseline? Yes   Triage Complete: Triage complete  Chief Complaint CVA (cerebral vascular accident) (HCC) [I63.9]  Triage Note Pt BIB [facility] for c/o weakness to right side; pt states he started out with ear pain x 2 days ago; pt states today he woke up and is unable to walk; EMS reports pt was unable to use his right leg   NIHSS 0 during triage   Allergies Allergies  Allergen Reactions   Cephalexin Anaphylaxis    throat swelling Patient said he can take amoxicillin   Amlodipine Swelling    Leg swelling    Level of Care/Admitting Diagnosis ED Disposition     ED Disposition  Admit   Condition  --   Comment  Hospital Area: [facility] [[id number]]  Level of Care: Telemetry Medical [104]  May place patient in observation at [facility] or [facility] if equivalent level of care is available:: No  Covid Evaluation: Asymptomatic - no recent exposure (last 10 days) testing not required  Diagnosis: CVA (cerebral vascular accident) [facility]) [[id number]]  Admitting Physician: Lilyan Gilford [[id number]]  Attending Physician: Lilyan Gilford [[id number]]          B Medical/Surgery History Past Medical History:  Diagnosis Date   CAD (coronary artery disease), native coronary artery    DES proximal LAD 07/2009; DES to RCA 04/2020   Chronic heart failure with preserved ejection fraction (HFpEF) (HCC)    Essential hypertension    Habitual alcohol use    Hyperlipidemia    PVC's (premature ventricular contractions)    Small cell lung cancer (HCC)    Right upper lobe 2008; no evidence of disease since treatment ended in 11/2007   Past Surgical History:  Procedure Laterality Date   CORONARY ATHERECTOMY N/A  04/26/2020   Procedure: CORONARY ATHERECTOMY;  Surgeon: Corky Crafts, MD;  Location: [facility] INVASIVE CV LAB;  Service: Cardiovascular;  Laterality: N/A;   CORONARY STENT INTERVENTION N/A 04/26/2020   Procedure: CORONARY STENT INTERVENTION;  Surgeon: Corky Crafts, MD;  Location: [facility] INVASIVE CV LAB;  Service: Cardiovascular;  Laterality: N/A;   CORONARY ULTRASOUND/IVUS N/A 04/26/2020   Procedure: Intravascular Ultrasound/IVUS;  Surgeon: Corky Crafts, MD;  Location: [facility] INVASIVE CV LAB;  Service: Cardiovascular;  Laterality: N/A;   Insertion of left subclavian Port-A-Cath  2008   LEFT HEART CATH AND CORONARY ANGIOGRAPHY N/A 07/03/2022   Procedure: LEFT HEART CATH AND CORONARY ANGIOGRAPHY;  Surgeon: Swaziland, Peter M, MD;  Location: [facility] INVASIVE CV LAB;  Service: Cardiovascular;  Laterality: N/A;   RIGHT/LEFT HEART CATH AND CORONARY ANGIOGRAPHY N/A 04/26/2020   Procedure: RIGHT/LEFT HEART CATH AND CORONARY ANGIOGRAPHY;  Surgeon: Corky Crafts, MD;  Location: [facility] INVASIVE CV LAB;  Service: Cardiovascular;  Laterality: N/A;   TEMPORARY PACEMAKER N/A 04/26/2020   Procedure: TEMPORARY PACEMAKER;  Surgeon: Corky Crafts, MD;  Location: [facility] INVASIVE CV LAB;  Service: Cardiovascular;  Laterality: N/A;   Video bronchoscopy and video mediastinoscopy  2008     A IV Location/Drains/Wounds Patient Lines/Drains/Airways Status     Active Line/Drains/Airways     Name Placement date Placement time Site Days   Implanted Port --  --  --  --   Peripheral IV 07/05/23 18 G  Anterior;Distal;Right;Upper Arm 07/05/23  1509  Arm  less than 1            Intake/Output Last 24 hours No intake or output data in the 24 hours ending 07/05/23 2154  Labs/Imaging Results for orders placed or performed during the hospital encounter of 07/05/23 (from the past 48 hour(s))  Ethanol     Status: None   Collection Time: 07/05/23  3:43 PM  Result Value Ref Range   Alcohol, Ethyl (B) <10 <10 mg/dL     Comment: (NOTE) Lowest detectable limit for serum alcohol is 10 mg/dL.  For medical purposes only. Performed at [facility], [address], [place], [place] [address]   Protime-INR     Status: None   Collection Time: 07/05/23  3:43 PM  Result Value Ref Range   Prothrombin Time 13.5 11.4 - 15.2 seconds   INR 1.0 0.8 - 1.2    Comment: (NOTE) INR goal varies based on device and disease states. Performed at [facility], [address] [place], [place] [address]   APTT     Status: None   Collection Time: 07/05/23  3:43 PM  Result Value Ref Range   aPTT 29 24 - 36 seconds    Comment: Performed at [facility], [address], [place], [place] [address]  CBC     Status: Abnormal   Collection Time: 07/05/23  3:43 PM  Result Value Ref Range   WBC 10.8 (H) 4.0 - 10.5 K/uL   RBC 4.78 4.22 - 5.81 MIL/uL   Hemoglobin 14.6 13.0 - 17.0 g/dL   HCT 91.4 78.2 - 95.6 %   MCV 91.6 80.0 - 100.0 fL   MCH 30.5 26.0 - 34.0 pg   MCHC 33.3 30.0 - 36.0 g/dL   RDW 21.3 08.6 - 57.8 %   Platelets 339 150 - 400 K/uL   nRBC 0.0 0.0 - 0.2 %    Comment: Performed at [facility] [address] [place], [place] [address]  Differential     Status: Abnormal   Collection Time: 07/05/23  3:43 PM  Result Value Ref Range   Neutrophils Relative % 85 %   Neutro Abs 9.2 (H) 1.7 - 7.7 K/uL   Lymphocytes Relative 7 %   Lymphs Abs 0.7 0.7 - 4.0 K/uL   Monocytes Relative 7 %   Monocytes Absolute 0.8 0.1 - 1.0 K/uL   Eosinophils Relative 0 %   Eosinophils Absolute 0.0 0.0 - 0.5 K/uL   Basophils Relative 0 %   Basophils Absolute 0.0 0.0 - 0.1 K/uL   Immature Granulocytes 1 %   Abs Immature Granulocytes 0.05 0.00 - 0.07 K/uL    Comment: Performed at [facility], [address], [place], [place] [address]  Comprehensive metabolic panel     Status: Abnormal   Collection Time: 07/05/23  3:43 PM  Result Value Ref Range   Sodium 134 (L) 135 - 145 mmol/L   Potassium 3.6 3.5 - 5.1 mmol/L   Chloride 101 98 - 111  mmol/L   CO2 22 22 - 32 mmol/L   Glucose, Bld 110 (H) 70 - 99 mg/dL    Comment: Glucose reference range applies only to samples taken after fasting for at least 8 hours.   BUN 16 8 - 23 mg/dL   Creatinine, Ser 1.32 0.61 - 1.24 mg/dL   Calcium 8.9 8.9 - 44.0 mg/dL   Total Protein 6.8 6.5 - 8.1 g/dL   Albumin 3.8 3.5 - 5.0 g/dL  AST 12 (L) 15 - 41 U/L   ALT 15 0 - 44 U/L   Alkaline Phosphatase 69 38 - 126 U/L   Total Bilirubin 0.8 0.3 - 1.2 mg/dL   GFR, Estimated >86 >57 mL/min    Comment: (NOTE) Calculated using the CKD-EPI Creatinine Equation (2021)    Anion gap 11 5 - 15    Comment: Performed at [facility] [address] [place], [place] [address]  Urine rapid drug screen (hosp performed)     Status: None   Collection Time: 07/05/23  8:13 PM  Result Value Ref Range   Opiates NONE DETECTED NONE DETECTED   Cocaine NONE DETECTED NONE DETECTED   Benzodiazepines NONE DETECTED NONE DETECTED   Amphetamines NONE DETECTED NONE DETECTED   Tetrahydrocannabinol NONE DETECTED NONE DETECTED   Barbiturates NONE DETECTED NONE DETECTED    Comment: (NOTE) DRUG SCREEN FOR MEDICAL PURPOSES ONLY.  IF CONFIRMATION IS NEEDED FOR ANY PURPOSE, NOTIFY LAB WITHIN 5 DAYS.  LOWEST DETECTABLE LIMITS FOR URINE DRUG SCREEN Drug Class                     Cutoff (ng/mL) Amphetamine and metabolites    1000 Barbiturate and metabolites    200 Benzodiazepine                 200 Opiates and metabolites        300 Cocaine and metabolites        300 THC                            50 Performed at [facility], [address], [place], [place] [address]   Urinalysis, Routine w reflex microscopic -Urine, Clean Catch     Status: Abnormal   Collection Time: 07/05/23  8:13 PM  Result Value Ref Range   Color, Urine YELLOW YELLOW   APPearance CLEAR CLEAR   Specific Gravity, Urine 1.033 (H) 1.005 - 1.030   pH 5.0 5.0 - 8.0   Glucose, UA >=500 (A) NEGATIVE mg/dL   Hgb urine dipstick NEGATIVE NEGATIVE    Bilirubin Urine NEGATIVE NEGATIVE   Ketones, ur 20 (A) NEGATIVE mg/dL   Protein, ur 30 (A) NEGATIVE mg/dL   Nitrite NEGATIVE NEGATIVE   Leukocytes,Ua NEGATIVE NEGATIVE   RBC / HPF 0-5 0 - 5 RBC/hpf   WBC, UA 0-5 0 - 5 WBC/hpf   Bacteria, UA NONE SEEN NONE SEEN   Squamous Epithelial / HPF 0-5 0 - 5 /HPF   Mucus PRESENT     Comment: Performed at [facility], [address] [place], [place] [address]   MR Brain Wo Contrast (neuro protocol)  Result Date: 07/05/2023 CLINICAL DATA:  Acute neurologic deficit EXAM: MRI HEAD WITHOUT CONTRAST TECHNIQUE: Multiplanar, multiecho pulse sequences of the brain and surrounding structures were obtained without intravenous contrast. COMPARISON:  06/01/2023 FINDINGS: Brain: Acute/early subacute infarct of the medial inferior right cerebellum. Fewer than 5 scattered microhemorrhages in a nonspecific pattern. Unchanged chronic ventriculomegaly. Normal brain parenchymal signal. The midline structures are normal. Vascular: Normal flow voids. Skull and upper cervical spine: Normal marrow signal. Sinuses/Orbits: Negative. Other: None. IMPRESSION: Acute/early subacute infarct of the medial inferior right cerebellum. Electronically Signed   By: Deatra Robinson M.D.   On: 07/05/2023 19:55   CT HEAD WO CONTRAST  Result Date: 07/05/2023 CLINICAL DATA:  Neuro deficit, acute, stroke suspected. Right-sided weakness. EXAM: CT HEAD WITHOUT CONTRAST TECHNIQUE: Contiguous axial images  were obtained from the base of the skull through the vertex without intravenous contrast. RADIATION DOSE REDUCTION: This exam was performed according to the departmental dose-optimization program which includes automated exposure control, adjustment of the mA and/or kV according to patient size and/or use of iterative reconstruction technique. COMPARISON:  MRI 06/01/2023 FINDINGS: Brain: Mild volume loss and small vessel disease throughout the deep white matter. Ventriculomegaly noted which may be related  to ex vacuo dilatation but appears somewhat out of proportion to volume loss. Cannot exclude normal pressure hydrocephalus. This is stable since prior MRI. No hemorrhage or acute infarction. No midline shift. Vascular: No hyperdense vessel or unexpected calcification. Skull: No acute calvarial abnormality. Sinuses/Orbits: No acute findings Other: None IMPRESSION: Chronic ventriculomegaly, stable since recent MRI. Consider normal pressure hydrocephalus in the appropriate clinical setting. Electronically Signed   By: Charlett Nose M.D.   On: 07/05/2023 17:29    Pending Labs Unresulted Labs (From admission, onward)     Start     Ordered   07/05/23 2050  Hemoglobin A1c  Once,   URGENT        07/05/23 2049   07/05/23 2049  Lipid panel  Once,   URGENT        07/05/23 2049            Vitals/Pain Today's Vitals   07/05/23 1507 07/05/23 1700 07/05/23 2119 07/05/23 2130  BP:  (!) 151/81 (!) 177/78 (!) 183/78  Pulse:   67 67  Resp:  (!) 23 20 13   Temp:   97.6 F (36.4 C)   TempSrc:   Oral   SpO2:   95% 96%  Weight: 215 lb 6.2 oz (97.7 kg)     Height: 5\' 10"  (1.778 m)     PainSc:        Isolation Precautions No active isolations  Medications Medications  aspirin EC tablet 81 mg (81 mg Oral Given 07/05/23 2114)  clopidogrel (PLAVIX) tablet 75 mg (has no administration in time range)  iohexol (OMNIPAQUE) 350 MG/ML injection 75 mL (has no administration in time range)  acetaminophen (TYLENOL) tablet 650 mg (has no administration in time range)    Mobility walks with person assist     Focused Assessments    R Recommendations: See Admitting Provider Note  Report given to:   Additional Notes:

## 2023-07-05 NOTE — H&P (Signed)
History and Physical    Patient: Jeremy Lopez ZOX:[id number] DOB: [birth year] DOA: 07/05/2023 DOS: the patient was seen and examined on 07/05/2023 PCP: [name], MD  Patient coming from: Home  Chief Complaint:  Chief Complaint  Patient presents with   Weakness   HPI: MAVRIC SLAVEY is a 72 y.o. male with medical history significant of coronary artery disease that presents to the ED with a chief complaint of difficulty with ambulation.  Patient reports that he went to bed last night at 11 PM.  That was his last known well.  At 2 AM he woke up with a frontal headache.  He has been having the same headache for 3 to 4 days.  He goes all the way across his forehead and down the right side of his face.  He noticed at that time paresthesias in both upper extremities.  He was off balance and unable to walk when he tried to get up to go to the bathroom.  The paresthesias went away within 10 minutes.  The headache has been intermittent.  It is better with he gets Tylenol, but then it comes back.  He reports no new weakness.  He does have chronic right sided lower extremity weakness that is being worked up by neurology.  12 hours later at 2 PM he was still unable to walk, so they brought him into the ED.  Patient reports that the right lower extremity weakness has manifested as his right leg dragging.  He has had some balance issues but nothing like it was today.  Patient reports he developed a cough today as well.  He produces some sputum with his cough, but no hemoptysis.  He denies any fever.  He felt nauseous last week, but that has not recurred.  He has had no vomiting.  He denies any chest pain, abdominal pain, diarrhea, rashes.  Patient has no other complaints at this time.  In the ED, workup revealed an acute/subacute infarct on MRI.  Neurology was consulted and recommended admission down to [facility].  Patient does not smoke.  He quit drinking 1 week ago.  He was drinking 3 beers a day up until  then.  He has had no tremors, anxiety, palpitations, feeling like bugs are crawling on him.  Patient is DNR reporting that he would want a natural death if his heart stops beating. Review of Systems: As mentioned in the history of present illness. All other systems reviewed and are negative. Past Medical History:  Diagnosis Date   CAD (coronary artery disease), native coronary artery    DES proximal LAD 07/2009; DES to RCA 04/2020   Chronic heart failure with preserved ejection fraction (HFpEF) (HCC)    Essential hypertension    Habitual alcohol use    Hyperlipidemia    PVC's (premature ventricular contractions)    Small cell lung cancer (HCC)    Right upper lobe 2008; no evidence of disease since treatment ended in 11/2007   Past Surgical History:  Procedure Laterality Date   CORONARY ATHERECTOMY N/A 04/26/2020   Procedure: CORONARY ATHERECTOMY;  Surgeon: Corky Crafts, MD;  Location: [facility] INVASIVE CV LAB;  Service: Cardiovascular;  Laterality: N/A;   CORONARY STENT INTERVENTION N/A 04/26/2020   Procedure: CORONARY STENT INTERVENTION;  Surgeon: Corky Crafts, MD;  Location: [facility] INVASIVE CV LAB;  Service: Cardiovascular;  Laterality: N/A;   CORONARY ULTRASOUND/IVUS N/A 04/26/2020   Procedure: Intravascular Ultrasound/IVUS;  Surgeon: Corky Crafts, MD;  Location: [facility]  INVASIVE CV LAB;  Service: Cardiovascular;  Laterality: N/A;   Insertion of left subclavian Port-A-Cath  2008   LEFT HEART CATH AND CORONARY ANGIOGRAPHY N/A 07/03/2022   Procedure: LEFT HEART CATH AND CORONARY ANGIOGRAPHY;  Surgeon: Swaziland, Peter M, MD;  Location: [facility] INVASIVE CV LAB;  Service: Cardiovascular;  Laterality: N/A;   RIGHT/LEFT HEART CATH AND CORONARY ANGIOGRAPHY N/A 04/26/2020   Procedure: RIGHT/LEFT HEART CATH AND CORONARY ANGIOGRAPHY;  Surgeon: Corky Crafts, MD;  Location: [facility] INVASIVE CV LAB;  Service: Cardiovascular;  Laterality: N/A;   TEMPORARY PACEMAKER N/A 04/26/2020   Procedure: TEMPORARY  PACEMAKER;  Surgeon: Corky Crafts, MD;  Location: [facility] INVASIVE CV LAB;  Service: Cardiovascular;  Laterality: N/A;   Video bronchoscopy and video mediastinoscopy  2008   Social History:  reports that he quit smoking about 24 years ago. His smoking use included cigarettes. He started smoking about 56 years ago. He has never used smokeless tobacco. He reports current alcohol use of about 14.0 standard drinks of alcohol per week. He reports current drug use. Drug: Marijuana.  Allergies  Allergen Reactions   Cephalexin Anaphylaxis    throat swelling Patient said he can take amoxicillin   Amlodipine Swelling    Leg swelling    Family History  Problem Relation Age of Onset   Cancer Father    CAD Brother    Hypertension Brother    Depression Maternal Uncle    Alcohol abuse Maternal Uncle     Prior to Admission medications   Medication Sig Start Date End Date Taking? Authorizing Provider  acetaminophen (TYLENOL) 500 MG tablet Take 500 mg by mouth every 6 (six) hours as needed for moderate pain.   Yes [provider]  albuterol (VENTOLIN HFA) 108 (90 Base) MCG/ACT inhaler Inhale 2 puffs into the lungs every 6 (six) hours as needed for wheezing or shortness of breath.   Yes [provider]  Cholecalciferol (VITAMIN D) 50 MCG (2000 UT) tablet Take 2,000 Units by mouth daily.   Yes [provider]  clopidogrel (PLAVIX) 75 MG tablet Take 1 tablet (75 mg total) by mouth daily with breakfast. 11/19/22  Yes Pricilla Riffle, MD  diltiazem (CARDIZEM CD) 240 MG 24 hr capsule Take 240 mg by mouth daily. 05/15/23  Yes [provider]  empagliflozin (JARDIANCE) 10 MG TABS tablet Take 1 tablet (10 mg total) by mouth daily before breakfast. 07/20/22  Yes Pricilla Riffle, MD  escitalopram (LEXAPRO) 10 MG tablet Take 1 tablet (10 mg total) by mouth daily. 06/21/23  Yes White, Arlys Rayhaan A, NP  Fluticasone-Umeclidin-Vilant (TRELEGY ELLIPTA) 100-62.5-25 MCG/ACT AEPB Inhale 1 puff  into the lungs daily. 10/19/22  Yes Icard, Bradley L, DO  gabapentin (NEURONTIN) 300 MG capsule Take 1 capsule (300 mg total) by mouth at bedtime. 02/01/23  Yes [name], MD  metoprolol succinate (TOPROL-XL) 100 MG 24 hr tablet TAKE ONE TABLET (100MG  TOTAL) BY MOUTH DAILY. TAKE WITH OR IMMEDIATELY FOLLOWING A MEAL. 12/19/22  Yes Pricilla Riffle, MD  nitroGLYCERIN (NITROSTAT) 0.4 MG SL tablet Place 1 tablet (0.4 mg total) under the tongue every 5 (five) minutes as needed for chest pain. 06/12/22 07/05/23 Yes Dunn, Dayna N, PA-C  prochlorperazine (COMPAZINE) 5 MG tablet Take 1 tablet (5 mg total) by mouth every 6 (six) hours as needed for nausea or vomiting. 02/01/23  Yes [name], MD  rosuvastatin (CRESTOR) 40 MG tablet TAKE ONE (1) TABLET BY MOUTH EVERY DAY 04/09/23  Yes Christel Mormon  E, MD  traZODone (DESYREL) 50 MG tablet Take 1 tablet (50 mg total) by mouth at bedtime. 06/21/23  Yes Joan Flores, NP    Physical Exam: Vitals:   07/05/23 1700 07/05/23 2119 07/05/23 2130 07/05/23 2330  BP: (!) 151/81 (!) 177/78 (!) 183/78 (!) 152/72  Pulse:  67 67 68  Resp: (!) 23 20 13 18   Temp:  97.6 F (36.4 C)  97.6 F (36.4 C)  TempSrc:  Oral  Oral  SpO2:  95% 96%   Weight:    97.6 kg  Height:    5\' 10"  (1.778 m)   1.  General: Patient lying supine in bed, head of bed elevated, no acute distress   2. Psychiatric: Alert and oriented x 3, mood and behavior normal for situation, pleasant and cooperative with exam   3. Neurologic: Speech and language are normal, face is symmetric, moves all 4 extremities voluntarily, finger-to-nose intact, equal sensation in the upper and lower extremities bilaterally  4. HEENMT:  Head is atraumatic, normocephalic, pupils reactive to light, neck is supple, trachea is midline, mucous membranes are moist   5. Respiratory : Lungs are clear to auscultation bilaterally without wheezing, rhonchi, rales, no cyanosis, no increase in work of breathing or accessory  muscle use   6. Cardiovascular : Heart rate normal, rhythm is regular, no murmurs, rubs or gallops, trace peripheral edema, peripheral pulses palpated  Port left chest   7. Gastrointestinal:  Abdomen is soft, nondistended, nontender to palpation bowel sounds active, no masses or organomegaly palpated   8. Skin:  Skin is warm, dry and intact without rashes, acute lesions, or ulcers on limited exam   9.Musculoskeletal:  No acute deformities or trauma, no asymmetry in tone, trace peripheral edema, peripheral pulses palpated, no tenderness to palpation in the extremities  Data Reviewed: In the ED Temp 97.8, heart rate 75, respiratory rate 14-23, blood pressure 151/74-158/81 Satting 94-100% No leukocytosis, hemoglobin stable Chemistry is unremarkable UA is not indicative of UTI Alcohol level is undetectable CT head shows ventriculomegaly that is stable from last CT MRI shows an acute/subacute infarct in the medial inferior right cerebellum EKG shows a heart rate of 74, sinus rhythm, QTc 449 Admission requested for CVA workup Assessment and Plan: * CVA (cerebral vascular accident) (HCC) -Acute/Early subacute infarct of the medial inferior right cerebellum -hgbA1C, lipid panel, Echo pending -Continue Asa and plavix -CTA head and neck pending -Neuro checks -Chronic right leg weakness, today off balance falling to the right -Neuro consulted and rec's admission to [facility] for CVA workup -Monitor on tele  Headache -Continue tylenol -Continue to monitor  CAD (coronary artery disease), native coronary artery -continue asa, plavix, statin -Hold metoprolol given permissive HTN  Essential hypertension -Holding cardizem and metoprolol for permissive HTN  Mixed hyperlipidemia -continue Statin      Advance Care Planning:   Code Status: Limited: Do not attempt resuscitation (DNR) -DNR-LIMITED -Do Not Intubate/DNI   Consults: Neurology, and they have been notified that he has arrived  at [facility]  Family Communication: Wife at bedside very sweet  Severity of Illness: The appropriate patient status for this patient is OBSERVATION. Observation status is judged to be reasonable and necessary in order to provide the required intensity of service to ensure the patient's safety. The patient's presenting symptoms, physical exam findings, and initial radiographic and laboratory data in the context of their medical condition is felt to place them at decreased risk for further clinical deterioration. Furthermore, it is anticipated  that the patient will be medically stable for discharge from the hospital within 2 midnights of admission.   Author: Lilyan Gilford, DO 07/05/2023 11:46 PM  For on call review [email or web address].

## 2023-07-05 NOTE — Assessment & Plan Note (Signed)
-  Acute/Early subacute infarct of the medial inferior right cerebellum -hgbA1C, lipid panel, Echo pending -Continue Asa and plavix -CTA head and neck pending -Neuro checks -Chronic right leg weakness, today off balance falling to the right -Neuro consulted and rec's admission to [facility] for CVA workup -Monitor on tele

## 2023-07-05 NOTE — Assessment & Plan Note (Signed)
-   continue Statin

## 2023-07-05 NOTE — ED Notes (Signed)
Pt attempted to give urine sample, was unable  Gave Pt water with permission from RN

## 2023-07-05 NOTE — Plan of Care (Signed)
Briefly, [name] is a 72 y.o. male who presented to the ED with vertigo and dysequilibrium. Follows with neurology for gait and balance difficulty with shuffling, stooped posture. He was noted to have parkinsonian features.  He presents today with acute worsening and MRI brain with R medial cerebellum stroke.  Neurology is not available at [facility] over the weekend.  Plan: - admit to [facility] cones hospitalist service with neurology consult. - CTA head and neck, Lipid panel, HbA1c, TTE, Aspirin and plavix. - please notify neurology when patient is at [facility].  Plan discussed with Dr. Deretha Emory.  Erick Blinks [facility]

## 2023-07-05 NOTE — ED Notes (Signed)
Collected urine and took to lab at 20:17 Pt family asked if he could have outside food,RN  gave permission.

## 2023-07-05 NOTE — ED Notes (Signed)
Son reports decrease in mobility over the past few months. Pt mainly shuffles now and has some difficulty maintaining balance. Raising legs appears to be difficult for patient.

## 2023-07-05 NOTE — ED Provider Notes (Addendum)
EMERGENCY DEPARTMENT AT [facility] Provider Note   CSN: [id number] Arrival date & time: 07/05/23  1447     History  Chief Complaint  Patient presents with   Weakness    Jeremy Lopez is a 72 y.o. male.  Patient brought in by EMS.  Patient's had increased inability to walk associated with dizziness but no room spinning.  Started yesterday.  Patient also with complaint of right ear pain for 2 days.  He has had both of these complaints for a while.  But the ability to walk is gotten worse.  Being followed by [facility] neurology that saw him in August.  Patient had MRI in August.  Sounds like there may be doing a workup to see if maybe his symptoms are secondary to Parkinson's.  He has not seen ear nose and throat about the ear pain.  However neurology is aware of that.  Patient's upper extremity lower extremities here without any weakness.  Speech is good and vision is good his NIHSS at least without walking him was 0.  Blood pressure 158/74 temp 97.8 oxygen saturation 94%.  Family was very concerned that he may have had a stroke.  Past medical history significant for coronary artery disease small cell lung cancer 2008 no evidence of disease since 2009.  Hypertension hyperlipidemia habitual alcohol use chronic heart failure with preserved ejection fraction.  Patient is on Plavix not on any other blood thinners.  Patient had a left heart cath in September 2023.  Patient is a former smoker quit in 2000.  Patient still drinking alcohol.  Apparently [place] neurology is planning a lumbar puncture soon.  As part of the workup.       Home Medications Prior to Admission medications   Medication Sig Start Date End Date Taking? Authorizing Provider  acetaminophen (TYLENOL) 500 MG tablet Take 500 mg by mouth every 6 (six) hours as needed for moderate pain.   Yes [provider]  albuterol (VENTOLIN HFA) 108 (90 Base) MCG/ACT inhaler Inhale 2 puffs into the lungs  every 6 (six) hours as needed for wheezing or shortness of breath.   Yes [provider]  Cholecalciferol (VITAMIN D) 50 MCG (2000 UT) tablet Take 2,000 Units by mouth daily.   Yes [provider]  clopidogrel (PLAVIX) 75 MG tablet Take 1 tablet (75 mg total) by mouth daily with breakfast. 11/19/22  Yes Pricilla Riffle, MD  diltiazem (CARDIZEM CD) 240 MG 24 hr capsule Take 240 mg by mouth daily. 05/15/23  Yes [provider]  empagliflozin (JARDIANCE) 10 MG TABS tablet Take 1 tablet (10 mg total) by mouth daily before breakfast. 07/20/22  Yes Pricilla Riffle, MD  escitalopram (LEXAPRO) 10 MG tablet Take 1 tablet (10 mg total) by mouth daily. 06/21/23  Yes White, Arlys Bridget A, NP  Fluticasone-Umeclidin-Vilant (TRELEGY ELLIPTA) 100-62.5-25 MCG/ACT AEPB Inhale 1 puff into the lungs daily. 10/19/22  Yes Icard, Bradley L, DO  gabapentin (NEURONTIN) 300 MG capsule Take 1 capsule (300 mg total) by mouth at bedtime. 02/01/23  Yes [name], MD  metoprolol succinate (TOPROL-XL) 100 MG 24 hr tablet TAKE ONE TABLET (100MG  TOTAL) BY MOUTH DAILY. TAKE WITH OR IMMEDIATELY FOLLOWING A MEAL. 12/19/22  Yes Pricilla Riffle, MD  nitroGLYCERIN (NITROSTAT) 0.4 MG SL tablet Place 1 tablet (0.4 mg total) under the tongue every 5 (five) minutes as needed for chest pain. 06/12/22 07/05/23 Yes Dunn, Dayna N, PA-C  prochlorperazine (COMPAZINE) 5 MG tablet Take  1 tablet (5 mg total) by mouth every 6 (six) hours as needed for nausea or vomiting. 02/01/23  Yes [name], MD  rosuvastatin (CRESTOR) 40 MG tablet TAKE ONE (1) TABLET BY MOUTH EVERY DAY 04/09/23  Yes [name], MD  traZODone (DESYREL) 50 MG tablet Take 1 tablet (50 mg total) by mouth at bedtime. 06/21/23  Yes Joan Flores, NP      Allergies    Cephalexin and Amlodipine    Review of Systems   Review of Systems  Constitutional:  Negative for chills and fever.  HENT:  Positive for ear pain. Negative for sore throat.   Eyes:  Negative for  pain and visual disturbance.  Respiratory:  Negative for cough and shortness of breath.   Cardiovascular:  Negative for chest pain and palpitations.  Gastrointestinal:  Negative for abdominal pain and vomiting.  Genitourinary:  Negative for dysuria and hematuria.  Musculoskeletal:  Negative for arthralgias and back pain.  Skin:  Negative for color change and rash.  Neurological:  Positive for dizziness. Negative for seizures and syncope.  Hematological:  Bruises/bleeds easily.  All other systems reviewed and are negative.   Physical Exam Updated Vital Signs BP (!) 151/81   Pulse 75   Temp 97.8 F (36.6 C) (Oral)   Resp (!) 23   Ht 1.778 m (5\' 10" )   Wt 97.7 kg   SpO2 94%   BMI 30.91 kg/m  Physical Exam Vitals and nursing note reviewed.  Constitutional:      General: He is not in acute distress.    Appearance: He is well-developed.  HENT:     Head: Normocephalic and atraumatic.     Right Ear: Tympanic membrane normal.     Left Ear: There is impacted cerumen.     Mouth/Throat:     Mouth: Mucous membranes are moist.  Eyes:     Conjunctiva/sclera: Conjunctivae normal.  Cardiovascular:     Rate and Rhythm: Normal rate and regular rhythm.     Heart sounds: No murmur heard. Pulmonary:     Effort: Pulmonary effort is normal. No respiratory distress.     Breath sounds: Normal breath sounds.  Abdominal:     Palpations: Abdomen is soft.     Tenderness: There is no abdominal tenderness.  Musculoskeletal:        General: No swelling.     Cervical back: Neck supple.  Skin:    General: Skin is warm and dry.     Capillary Refill: Capillary refill takes less than 2 seconds.  Neurological:     General: No focal deficit present.     Mental Status: He is alert and oriented to person, place, and time.     Cranial Nerves: No cranial nerve deficit.     Sensory: No sensory deficit.     Motor: No weakness.  Psychiatric:        Mood and Affect: Mood normal.     ED Results /  Procedures / Treatments   Labs (all labs ordered are listed, but only abnormal results are displayed) Labs Reviewed  CBC - Abnormal; Notable for the following components:      Result Value   WBC 10.8 (*)    All other components within normal limits  DIFFERENTIAL - Abnormal; Notable for the following components:   Neutro Abs 9.2 (*)    All other components within normal limits  COMPREHENSIVE METABOLIC PANEL - Abnormal; Notable for the following components:   Sodium 134 (*)  Glucose, Bld 110 (*)    AST 12 (*)    All other components within normal limits  URINALYSIS, ROUTINE W REFLEX MICROSCOPIC - Abnormal; Notable for the following components:   Specific Gravity, Urine 1.033 (*)    Glucose, UA >=500 (*)    Ketones, ur 20 (*)    Protein, ur 30 (*)    All other components within normal limits  ETHANOL  PROTIME-INR  APTT  RAPID URINE DRUG SCREEN, HOSP PERFORMED  LIPID PANEL  HEMOGLOBIN A1C    EKG EKG Interpretation Date/Time:  Friday July 05 2023 15:02:58 EDT Ventricular Rate:  74 PR Interval:  205 QRS Duration:  102 QT Interval:  404 QTC Calculation: 449 R Axis:   -27  Text Interpretation: Sinus rhythm Borderline left axis deviation Abnormal R-wave progression, early transition No significant change since last tracing Confirmed by Vanetta Mulders [phone]) on 07/05/2023 3:09:30 PM  Radiology MR Brain Wo Contrast (neuro protocol)  Result Date: 07/05/2023 CLINICAL DATA:  Acute neurologic deficit EXAM: MRI HEAD WITHOUT CONTRAST TECHNIQUE: Multiplanar, multiecho pulse sequences of the brain and surrounding structures were obtained without intravenous contrast. COMPARISON:  06/01/2023 FINDINGS: Brain: Acute/early subacute infarct of the medial inferior right cerebellum. Fewer than 5 scattered microhemorrhages in a nonspecific pattern. Unchanged chronic ventriculomegaly. Normal brain parenchymal signal. The midline structures are normal. Vascular: Normal flow voids. Skull and  upper cervical spine: Normal marrow signal. Sinuses/Orbits: Negative. Other: None. IMPRESSION: Acute/early subacute infarct of the medial inferior right cerebellum. Electronically Signed   By: Deatra Robinson M.D.   On: 07/05/2023 19:55   CT HEAD WO CONTRAST  Result Date: 07/05/2023 CLINICAL DATA:  Neuro deficit, acute, stroke suspected. Right-sided weakness. EXAM: CT HEAD WITHOUT CONTRAST TECHNIQUE: Contiguous axial images were obtained from the base of the skull through the vertex without intravenous contrast. RADIATION DOSE REDUCTION: This exam was performed according to the departmental dose-optimization program which includes automated exposure control, adjustment of the mA and/or kV according to patient size and/or use of iterative reconstruction technique. COMPARISON:  MRI 06/01/2023 FINDINGS: Brain: Mild volume loss and small vessel disease throughout the deep white matter. Ventriculomegaly noted which may be related to ex vacuo dilatation but appears somewhat out of proportion to volume loss. Cannot exclude normal pressure hydrocephalus. This is stable since prior MRI. No hemorrhage or acute infarction. No midline shift. Vascular: No hyperdense vessel or unexpected calcification. Skull: No acute calvarial abnormality. Sinuses/Orbits: No acute findings Other: None IMPRESSION: Chronic ventriculomegaly, stable since recent MRI. Consider normal pressure hydrocephalus in the appropriate clinical setting. Electronically Signed   By: Charlett Nose M.D.   On: 07/05/2023 17:29    Procedures Procedures    Medications Ordered in ED Medications  aspirin EC tablet 81 mg (has no administration in time range)  clopidogrel (PLAVIX) tablet 75 mg (has no administration in time range)    ED Course/ Medical Decision Making/ A&P                                 Medical Decision Making Amount and/or Complexity of Data Reviewed Labs: ordered. Radiology: ordered.  Risk Decision regarding  hospitalization.   CRITICAL CARE Performed by: Vanetta Mulders Total critical care time: 40 minutes Critical care time was exclusive of separately billable procedures and treating other patients. Critical care was necessary to treat or prevent imminent or life-threatening deterioration. Critical care was time spent personally by me on the following activities: development  of treatment plan with patient and/or surrogate as well as nursing, discussions with consultants, evaluation of patient's response to treatment, examination of patient, obtaining history from patient or surrogate, ordering and performing treatments and interventions, ordering and review of laboratory studies, ordering and review of radiographic studies, pulse oximetry and re-evaluation of patient's condition.  Family is very concerned that he has had a CVA.  But neurology notes seem to imply this has been present.  With her saying is much worse.  Will do stroke order set.  Patient is not a candidate for TNK or other interventions.  Because of the onset of this being yesterday.  Plus this is probably somewhat baseline.  Negative MRI in August.  But we will going get head CT and then follow it up with MRI if head CT negative.  Patient's right ear pain tympanic membrane very normal.  This is also been present for a while neurology is also aware of this.  Patient has not seen ear nose and throat but will probably give him a referral to ear nose and throat.  CT head without any acute changes compared to previous.  MRI here tonight consistent with acute early subacute infarct of the medial inferior right cerebellum.  This will probably explains patient's inability to walk.  Will contact on-call neurohospitalist at [facility] for recommendations on place of admission.  Discussed with on-call overnight neurologist at [facility] Dr. Kirtland Bouchard he is recommending hospitalist admission to [facility].  He will put in some initial orders to include CT angio head and  neck.  Will contact hospitalist here to have admitted there.  Final Clinical Impression(s) / ED Diagnoses Final diagnoses:  Right ear pain  Dizziness  Gait abnormality  Cerebrovascular accident (CVA), unspecified mechanism [facility])    Rx / DC Orders ED Discharge Orders     None         Vanetta Mulders, MD 07/05/23 1617    Vanetta Mulders, MD 07/05/23 4098    Vanetta Mulders, MD 07/05/23 2051

## 2023-07-06 ENCOUNTER — Other Ambulatory Visit (HOSPITAL_COMMUNITY): Payer: HMO

## 2023-07-06 DIAGNOSIS — G912 (Idiopathic) normal pressure hydrocephalus: Secondary | ICD-10-CM | POA: Diagnosis not present

## 2023-07-06 DIAGNOSIS — I63211 Cerebral infarction due to unspecified occlusion or stenosis of right vertebral arteries: Secondary | ICD-10-CM

## 2023-07-06 DIAGNOSIS — I639 Cerebral infarction, unspecified: Secondary | ICD-10-CM | POA: Diagnosis not present

## 2023-07-06 LAB — COMPREHENSIVE METABOLIC PANEL
ALT: 13 U/L (ref 0–44)
AST: 11 U/L — ABNORMAL LOW (ref 15–41)
Albumin: 3.3 g/dL — ABNORMAL LOW (ref 3.5–5.0)
Alkaline Phosphatase: 61 U/L (ref 38–126)
Anion gap: 10 (ref 5–15)
BUN: 18 mg/dL (ref 8–23)
CO2: 25 mmol/L (ref 22–32)
Calcium: 8.5 mg/dL — ABNORMAL LOW (ref 8.9–10.3)
Chloride: 99 mmol/L (ref 98–111)
Creatinine, Ser: 1.36 mg/dL — ABNORMAL HIGH (ref 0.61–1.24)
GFR, Estimated: 55 mL/min — ABNORMAL LOW (ref >60)
Glucose, Bld: 95 mg/dL (ref 70–99)
Potassium: 3.6 mmol/L (ref 3.5–5.1)
Sodium: 134 mmol/L — ABNORMAL LOW (ref 135–145)
Total Bilirubin: 0.6 mg/dL (ref 0.3–1.2)
Total Protein: 6 g/dL — ABNORMAL LOW (ref 6.5–8.1)

## 2023-07-06 LAB — CBC
HCT: 42.7 % (ref 39.0–52.0)
Hemoglobin: 14.2 g/dL (ref 13.0–17.0)
MCH: 30.9 pg (ref 26.0–34.0)
MCHC: 33.3 g/dL (ref 30.0–36.0)
MCV: 92.8 fL (ref 80.0–100.0)
Platelets: 298 10*3/uL (ref 150–400)
RBC: 4.6 MIL/uL (ref 4.22–5.81)
RDW: 14.4 % (ref 11.5–15.5)
WBC: 9.4 10*3/uL (ref 4.0–10.5)
nRBC: 0 % (ref 0.0–0.2)

## 2023-07-06 LAB — HEMOGLOBIN A1C
Hgb A1c MFr Bld: 5.7 % — ABNORMAL HIGH (ref 4.8–5.6)
Mean Plasma Glucose: 116.89 mg/dL

## 2023-07-06 LAB — LIPID PANEL
Cholesterol: 98 mg/dL (ref 0–200)
HDL: 42 mg/dL (ref >40)
LDL Cholesterol: 34 mg/dL (ref 0–99)
Total CHOL/HDL Ratio: 2.3 {ratio}
Triglycerides: 109 mg/dL (ref <150)
VLDL: 22 mg/dL (ref 0–40)

## 2023-07-06 MED ORDER — ORAL CARE MOUTH RINSE: 15.0000 mL | OROMUCOSAL | Status: DC | PRN

## 2023-07-06 NOTE — Progress Notes (Signed)
PT Cancellation Note  Patient Details Name: Jeremy Lopez MRN: [id number] DOB: Sep 13, 1951   Cancelled Treatment:    Reason Eval/Treat Not Completed: Other (comment)  Just received breakfast, requested PT follow-up later. Will return for full assessment.  [name], PT, DPT [facility]  Rehabilitation Services Physical Therapist Office: [phone] Website: [email or web address]  [name] 07/06/2023, 10:18 AM

## 2023-07-06 NOTE — Progress Notes (Signed)
   07/06/23 1100  SLP Visit Information  SLP Received On 07/06/23  Reason Eval/Treat Not Completed Other (comment) (pt working with PT/OT at attempted eval)    Will continue efforts.   Rolena Infante, MS [facility] SLP Acute [employer] [phone]

## 2023-07-06 NOTE — Progress Notes (Signed)
Inpatient Rehab Admissions Coordinator:  ? ?Per therapy recommendations,  patient was screened for CIR candidacy by Devaney Segers, MS, CCC-SLP. At this time, Pt. Appears to be a a potential candidate for CIR. I will place   order for rehab consult per protocol for full assessment. Please contact me any with questions. ? ?Trine Fread, MS, CCC-SLP ?Rehab Admissions Coordinator  ?[phone] (celll) ?[phone] (office) ? ?

## 2023-07-06 NOTE — Evaluation (Signed)
Occupational Therapy Evaluation Patient Details Name: Jeremy Lopez MRN: [id number] DOB: [birth year] Today's Date: 07/06/2023   History of Present Illness Pt is a 72 y.o. male presenting 9/27 with R weakness and dysequilibrium. MRI brain revealed acute/early subacute infarct of the medial inferior right cerebellum. CAD, EtOh use, HTN, HLD.   Clinical Impression   PTA, pt lived with wife and was independent in ADL, IADL, and driving. Upon eval, pt with decr balance, strength, and coordination; R lateral lean during mobility. Pt performing functional mobility with up to mod A, LB ADL with up to max A and UB ADL with set-up. Pt with good support and motivation; to benefit from continued skilled OT acutely. Due to motivation, support, and significant change in functional status, recommending intensive multidisciplinary rehabilitation >3 hours/day to optimize safety and independence in ADL.        If plan is discharge home, recommend the following: A lot of help with walking and/or transfers;A lot of help with bathing/dressing/bathroom;Assistance with cooking/housework;Direct supervision/assist for medications management;Direct supervision/assist for financial management;Assist for transportation;Help with stairs or ramp for entrance    Functional Status Assessment  Patient has had a recent decline in their functional status and demonstrates the ability to make significant improvements in function in a reasonable and predictable amount of time.  Equipment Recommendations  Other (comment) (defer)    Recommendations for Other Services Rehab consult     Precautions / Restrictions Precautions Precautions: Fall Restrictions Weight Bearing Restrictions: No      Mobility Bed Mobility Overal bed mobility: Needs Assistance Bed Mobility: Supine to Sit     Supine to sit: Contact guard     General bed mobility comments: for safety    Transfers Overall transfer level: Needs  assistance Equipment used: Rolling walker (2 wheels) Transfers: Sit to/from Stand Sit to Stand: Min assist, +2 safety/equipment           General transfer comment: for steadying      Balance Overall balance assessment: Needs assistance Sitting-balance support: No upper extremity supported, Feet supported Sitting balance-Leahy Scale: Fair Sitting balance - Comments: R lateral lean/posterior lean durring LE MMT Postural control: Right lateral lean Standing balance support: Bilateral upper extremity supported, During functional activity Standing balance-Leahy Scale: Poor Standing balance comment: reliant on RW and external support                           ADL either performed or assessed with clinical judgement   ADL Overall ADL's : Needs assistance/impaired Eating/Feeding: Modified independent;Bed level   Grooming: Set up;Sitting   Upper Body Bathing: Set up;Sitting   Lower Body Bathing: Moderate assistance;Sit to/from stand   Upper Body Dressing : Set up;Sitting   Lower Body Dressing: Sit to/from stand;Maximal assistance   Toilet Transfer: Moderate assistance;Ambulation;Rolling walker (2 wheels)           Functional mobility during ADLs: Moderate assistance;Rolling walker (2 wheels)       Vision Ability to See in Adequate Light: 0 Adequate Patient Visual Report: No change from baseline Vision Assessment?: No apparent visual deficits Additional Comments: visual fields WFL, able to read, tracking fair     Perception Perception: Not tested       Praxis Praxis: Not tested       Pertinent Vitals/Pain Pain Assessment Pain Assessment: No/denies pain     Extremity/Trunk Assessment Upper Extremity Assessment Upper Extremity Assessment: Left hand dominant;RUE deficits/detail RUE Deficits / Details: Min decr coordination  Lower Extremity Assessment Lower Extremity Assessment: Defer to PT evaluation       Communication  Communication Communication: No apparent difficulties Cueing Techniques: Verbal cues   Cognition Arousal: Alert Behavior During Therapy: WFL for tasks assessed/performed Overall Cognitive Status: Within Functional Limits for tasks assessed                                 General Comments: WFL for basic tasks and money management     General Comments       Exercises     Shoulder Instructions      Home Living Family/patient expects to be discharged to:: Private residence Living Arrangements: Spouse/significant other Available Help at Discharge: Family Type of Home: House Home Access: Level entry     Home Layout: One level     Bathroom Shower/Tub: Producer, television/film/video: Handicapped height     Home Equipment: Shower seat          Prior Functioning/Environment Prior Level of Function : Independent/Modified Independent;Driving                        OT Problem List: Decreased strength;Decreased activity tolerance;Impaired balance (sitting and/or standing);Decreased coordination;Decreased safety awareness;Decreased knowledge of use of DME or AE      OT Treatment/Interventions: Self-care/ADL training;Therapeutic exercise;DME and/or AE instruction;Balance training;Patient/family education;Therapeutic activities    OT Goals(Current goals can be found in the care plan section) Acute Rehab OT Goals Patient Stated Goal: get better OT Goal Formulation: With patient Time For Goal Achievement: 07/20/23 Potential to Achieve Goals: Good  OT Frequency: Min 1X/week    Co-evaluation PT/OT/SLP Co-Evaluation/Treatment: Yes Reason for Co-Treatment: Complexity of the patient's impairments (multi-system involvement);For patient/therapist safety;To address functional/ADL transfers PT goals addressed during session: Mobility/safety with mobility;Balance;Proper use of DME;Strengthening/ROM OT goals addressed during session: ADL's and self-care       AM-PAC OT "6 Clicks" Daily Activity     Outcome Measure Help from another person eating meals?: None Help from another person taking care of personal grooming?: A Little Help from another person toileting, which includes using toliet, bedpan, or urinal?: A Lot Help from another person bathing (including washing, rinsing, drying)?: A Lot Help from another person to put on and taking off regular upper body clothing?: A Little Help from another person to put on and taking off regular lower body clothing?: A Lot 6 Click Score: 16   End of Session Equipment Utilized During Treatment: Gait belt;Rolling walker (2 wheels) Nurse Communication: Mobility status  Activity Tolerance: Patient tolerated treatment well Patient left: in chair;with call bell/phone within reach;with chair alarm set;with family/visitor present  OT Visit Diagnosis: Unsteadiness on feet (R26.81);Muscle weakness (generalized) (M62.81);Other abnormalities of gait and mobility (R26.89)                Time: 1035-1100 OT Time Calculation (min): 25 min Charges:  OT General Charges $OT Visit: 1 Visit OT Evaluation $OT Eval Moderate Complexity: 1 Mod  Tyler Deis, OTR/L [facility] Acute Rehabilitation Office: [phone]   Myrla Halsted 07/06/2023, 1:34 PM

## 2023-07-06 NOTE — Progress Notes (Signed)
PROGRESS NOTE    Jeremy Lopez  ZOX:[id number] DOB: 03-24-1951 DOA: 07/05/2023 PCP: Jeremy Lade, MD    Brief Narrative:   Jeremy Lopez is a 72 y.o. male with past medical history significant for essential hypertension, CAD s/p PCI/DES, HLD, major depressive disorder, Hx lung cancer s/p chemotherapy/radiation, asthmatic bronchitis, EtOH use, former tobacco abuse who presented to [facility] ED on 9/27 via EMS for right-sided weakness, headache, disequilibrium.  Also endorsed paresthesias to right side of face and bilateral upper extremities that subsided in 10 minutes.  Initial symptoms onset at 4 AM in which he was feeling fine at 2 AM.  Given patient having significant trouble getting in downstairs, EMS was activated and patient was brought to ED for further evaluation.  In the ED, temperature 97.8 F, HR 75, RR 14, BP 158/74, SpO2 94% on room air.  WBC 10.8, hemoglobin 14.6, platelet count 339.  Sodium 134, potassium 3.6, chloride 101, CO2 22, glucose 110, BUN 16, creat 1.06.  AST 12, ALT 15, total bili Ruben 0.8.  Total cholesterol 107, LDL 38, HDL 48.  Urinalysis unrevealing.  UDS negative.  EtOH level less than 10.  CT head without contrast with chronic ventriculomegaly, stable since recent MRI.  CT angiogram head/neck with no large vessel occlusion.  MRI brain without contrast with early subacute versus acute infarct medial inferior right cerebellum.  EKG with NSR.  Neurology was consulted, recommended transfer to [facility] for continued stroke evaluation.  TRH consulted for admission and patient was transferred to [facility].  Assessment & Plan:    Acute ischemic cerebellar CVA Patient presenting to ED with disequilibrium, right sided weakness with paresthesias.  Last known normal 2 AM on 07/06/2023.  CT head with noted chronic ventriculomegaly.  CT angiogram head/neck with no large vessel occlusion.  MRI brain without contrast with acute versus early  subacute infarct medial inferior right cerebellum.  -- Neurology following, appreciate assistance -- LDL 34 -- Hemoglobin A1c: Pending -- TTE: Pending -- Aspirin 81 mg p.o. daily -- Plavix 75 mg p.o. daily -- Continue monitor on telemetry -- PT/OT/SLP evaluation: Pending  Essential hypertension Home regimen includes diltiazem 240 mg p.o. daily, metoprolol succinate 100 mg p.o. daily. -- Holding home Antivert tensive's to allow for permissive hypertension up to 220/110 in the setting of acute CVA  CAD s/p PCI/DES Follows with cardiology outpatient.  Hyperlipidemia Total cholesterol 107, LDL 38, HDL 48. -- Continue home Crestor 40 mg p.o. daily  Major depressive disorder -- Lexapro 10 mg p.o. daily  History of lung cancer s/p chemotherapy/radiation Follows with pulmonology outpatient, continue outpatient pulmonary rehab.  Asthmatic bronchitis Former tobacco use disorder Follow with pulmonology outpatient, Dr. Tonia Brooms.  On Trelegy Ellipta outpatient. -- Continue Breo Ellipta and Incruse Ellipta as hospital substitution 1 puff daily  EtOH use disorder Counseled on need for complete cessation/abstinence.  DVT prophylaxis: enoxaparin (LOVENOX) injection 40 mg Start: 07/06/23 1000 SCD's Start: 07/05/23 2347    Code Status: Limited: Do not attempt resuscitation (DNR) -DNR-LIMITED -Do Not Intubate/DNI  Family Communication:   Disposition Plan:  Level of care: Telemetry Medical Status is: Observation The patient remains OBS appropriate and will d/c before 2 midnights.    Consultants:  Neurology  Procedures:  TTE: Pending  Antimicrobials:  None   Subjective: Patient seen examined bedside, resting calmly.  Lying in bed.  Paresthesias remain resolved.  Overall feels back to normal but has not tried to ambulate as of yet.  Patient asking if he should have lumbar puncture performed as this was scheduled as an outpatient for concern of NPH.  No other specific complaints or  concerns at this time.  Denies headache, no dizziness, no chest pain, no palpitation, no shortness of breath, no abdominal pain, no fever/chills/night sweats, no nausea/vomiting/diarrhea, no focal weakness, no fatigue, no paresthesias.  No acute events overnight per nurse staff.  Objective: Vitals:   07/05/23 2130 07/05/23 2330 07/06/23 0716 07/06/23 0838  BP: (!) 183/78 (!) 152/72 (!) 143/69   Pulse: 67 68 65 68  Resp: 13 18 17 16   Temp:  97.6 F (36.4 C) 98.3 F (36.8 C)   TempSrc:  Oral Oral   SpO2: 96%  96%   Weight:  97.6 kg    Height:  5\' 10"  (1.778 m)      Intake/Output Summary (Last 24 hours) at 07/06/2023 1044 Last data filed at 07/06/2023 0600 Gross per 24 hour  Intake 240 ml  Output 450 ml  Net -210 ml   Filed Weights   07/05/23 1507 07/05/23 2330  Weight: 97.7 kg 97.6 kg    Examination:  Physical Exam: GEN: NAD, alert and oriented x 3, obese HEENT: NCAT, PERRL, EOMI, sclera clear, MMM PULM: CTAB w/o wheezes/crackles, normal respiratory effort, on room air CV: RRR w/o M/G/R GI: abd soft, NTND, NABS, no R/G/M MSK: no peripheral edema, muscle strength globally intact 5/5 bilateral upper/lower extremities NEURO: No focal neurological deficits, gait not evaluated PSYCH: normal mood/affect Integumentary: dry/intact, no rashes or wounds    Data Reviewed: I have personally reviewed following labs and imaging studies  CBC: Recent Labs  Lab 07/05/23 1543 07/06/23 0602  WBC 10.8* 9.4  NEUTROABS 9.2*  --   HGB 14.6 14.2  HCT 43.8 42.7  MCV 91.6 92.8  PLT 339 298   Basic Metabolic Panel: Recent Labs  Lab 07/05/23 1543 07/06/23 0602  NA 134* 134*  K 3.6 3.6  CL 101 99  CO2 22 25  GLUCOSE 110* 95  BUN 16 18  CREATININE 1.06 1.36*  CALCIUM 8.9 8.5*   GFR: Estimated Creatinine Clearance: 57.5 mL/min (A) (by C-G formula based on SCr of 1.36 mg/dL (H)). Liver Function Tests: Recent Labs  Lab 07/05/23 1543 07/06/23 0602  AST 12* 11*  ALT 15 13   ALKPHOS 69 61  BILITOT 0.8 0.6  PROT 6.8 6.0*  ALBUMIN 3.8 3.3*   No results for input(s): "LIPASE", "AMYLASE" in the last 168 hours. No results for input(s): "AMMONIA" in the last 168 hours. Coagulation Profile: Recent Labs  Lab 07/05/23 1543  INR 1.0   Cardiac Enzymes: No results for input(s): "CKTOTAL", "CKMB", "CKMBINDEX", "TROPONINI" in the last 168 hours. BNP (last 3 results) No results for input(s): "PROBNP" in the last 8760 hours. HbA1C: No results for input(s): "HGBA1C" in the last 72 hours. CBG: No results for input(s): "GLUCAP" in the last 168 hours. Lipid Profile: Recent Labs    07/05/23 1543 07/06/23 0602  CHOL 107 98  HDL 48 42  LDLCALC 38 34  TRIG 104 109  CHOLHDL 2.2 2.3   Thyroid Function Tests: No results for input(s): "TSH", "T4TOTAL", "FREET4", "T3FREE", "THYROIDAB" in the last 72 hours. Anemia Panel: No results for input(s): "VITAMINB12", "FOLATE", "FERRITIN", "TIBC", "IRON", "RETICCTPCT" in the last 72 hours. Sepsis Labs: No results for input(s): "PROCALCITON", "LATICACIDVEN" in the last 168 hours.  No results found for this or any previous visit (from the past 240 hour(s)).  Radiology Studies: CT ANGIO HEAD NECK W WO CM  Result Date: 07/05/2023 CLINICAL DATA:  Right-sided weakness EXAM: CT ANGIOGRAPHY HEAD AND NECK WITH AND WITHOUT CONTRAST TECHNIQUE: Multidetector CT imaging of the head and neck was performed using the standard protocol during bolus administration of intravenous contrast. Multiplanar CT image reconstructions and MIPs were obtained to evaluate the vascular anatomy. Carotid stenosis measurements (when applicable) are obtained utilizing NASCET criteria, using the distal internal carotid diameter as the denominator. RADIATION DOSE REDUCTION: This exam was performed according to the departmental dose-optimization program which includes automated exposure control, adjustment of the mA and/or kV according to patient size and/or  use of iterative reconstruction technique. CONTRAST:  75mL OMNIPAQUE IOHEXOL 350 MG/ML SOLN COMPARISON:  None Available. FINDINGS: CTA NECK FINDINGS SKELETON: There is no bony spinal canal stenosis. No lytic or blastic lesion. OTHER NECK: Normal pharynx, larynx and major salivary glands. No cervical lymphadenopathy. Unremarkable thyroid gland. UPPER CHEST: Scarring/fibrosis at the paramediastinal right upper lobe. AORTIC ARCH: There is calcific atherosclerosis of the aortic arch. Conventional 3 vessel aortic branching pattern. RIGHT CAROTID SYSTEM: There is calcific atherosclerosis within the distal common carotid artery and at the carotid bifurcation. There is less than 50% stenosis of the internal carotid artery. The ICA is normal distally. LEFT CAROTID SYSTEM: Calcific atherosclerosis at the carotid bifurcation and proximal ICA with less than 50% stenosis. VERTEBRAL ARTERIES: Codominant configuration.Moderately narrowed right V1 segment. Multifocal mild narrowing of the right V2 segment. The right V3 segment is normal. Left vertebral artery V1 and V2 segments are normal. There is mild beading of the left V3 segment, which remains widely patent. CTA HEAD FINDINGS POSTERIOR CIRCULATION: --Vertebral arteries: Moderate caliber change of the right V4 segment just distal to the origin of the right PICA. Normal left V4 segment. --Inferior cerebellar arteries: Normal. --Basilar artery: Normal. --Superior cerebellar arteries: Normal. --Posterior cerebral arteries (PCA): Normal. ANTERIOR CIRCULATION: --Intracranial internal carotid arteries: Atherosclerotic calcification of the internal carotid arteries at the skull base without hemodynamically significant stenosis. --Anterior cerebral arteries (ACA): Normal. Both A1 segments are present. Patent anterior communicating artery (a-comm). --Middle cerebral arteries (MCA): Normal. VENOUS SINUSES: As permitted by contrast timing, patent. ANATOMIC VARIANTS: None Review of the MIP  images confirms the above findings. IMPRESSION: 1. No emergent large vessel occlusion. 2. Moderate narrowing of the right vertebral artery V4 segment just distal to the origin of the right PICA. 3. Moderately narrowed right V1 segment and multifocal mild narrowing of the right V2 segment. Irregularity of the left V3 segment, which remains widely patent. 4. Bilateral carotid bifurcation atherosclerosis with less than 50% stenosis. Aortic Atherosclerosis (ICD10-I70.0). Electronically Signed   By: Deatra Robinson M.D.   On: 07/05/2023 22:37   MR Brain Wo Contrast (neuro protocol)  Result Date: 07/05/2023 CLINICAL DATA:  Acute neurologic deficit EXAM: MRI HEAD WITHOUT CONTRAST TECHNIQUE: Multiplanar, multiecho pulse sequences of the brain and surrounding structures were obtained without intravenous contrast. COMPARISON:  06/01/2023 FINDINGS: Brain: Acute/early subacute infarct of the medial inferior right cerebellum. Fewer than 5 scattered microhemorrhages in a nonspecific pattern. Unchanged chronic ventriculomegaly. Normal brain parenchymal signal. The midline structures are normal. Vascular: Normal flow voids. Skull and upper cervical spine: Normal marrow signal. Sinuses/Orbits: Negative. Other: None. IMPRESSION: Acute/early subacute infarct of the medial inferior right cerebellum. Electronically Signed   By: Deatra Robinson M.D.   On: 07/05/2023 19:55   CT HEAD WO CONTRAST  Result Date: 07/05/2023 CLINICAL DATA:  Neuro deficit, acute, stroke suspected. Right-sided weakness. EXAM: CT  HEAD WITHOUT CONTRAST TECHNIQUE: Contiguous axial images were obtained from the base of the skull through the vertex without intravenous contrast. RADIATION DOSE REDUCTION: This exam was performed according to the departmental dose-optimization program which includes automated exposure control, adjustment of the mA and/or kV according to patient size and/or use of iterative reconstruction technique. COMPARISON:  MRI 06/01/2023  FINDINGS: Brain: Mild volume loss and small vessel disease throughout the deep white matter. Ventriculomegaly noted which may be related to ex vacuo dilatation but appears somewhat out of proportion to volume loss. Cannot exclude normal pressure hydrocephalus. This is stable since prior MRI. No hemorrhage or acute infarction. No midline shift. Vascular: No hyperdense vessel or unexpected calcification. Skull: No acute calvarial abnormality. Sinuses/Orbits: No acute findings Other: None IMPRESSION: Chronic ventriculomegaly, stable since recent MRI. Consider normal pressure hydrocephalus in the appropriate clinical setting. Electronically Signed   By: Charlett Nose M.D.   On: 07/05/2023 17:29        Scheduled Meds:   stroke: early stages of recovery book   Does not apply Once   aspirin EC  81 mg Oral Daily   clopidogrel  75 mg Oral Daily   enoxaparin (LOVENOX) injection  40 mg Subcutaneous Q24H   escitalopram  10 mg Oral Daily   fluticasone furoate-vilanterol  1 puff Inhalation Daily   And   umeclidinium bromide  1 puff Inhalation Daily   gabapentin  300 mg Oral QHS   influenza vaccine adjuvanted  0.5 mL Intramuscular Tomorrow-1000   rosuvastatin  40 mg Oral Daily   traZODone  50 mg Oral QHS   Continuous Infusions:   LOS: 0 days    Time spent: 54 minutes spent on chart review, discussion with nursing staff, consultants, updating family and interview/physical exam; more than 50% of that time was spent in counseling and/or coordination of care.    Alvira Philips [place], DO [facility] Available via Epic secure chat 7am-7pm After these hours, please refer to coverage provider listed on [email or web address] 07/06/2023, 10:44 AM

## 2023-07-06 NOTE — Evaluation (Signed)
Physical Therapy Evaluation Patient Details Name: Jeremy Lopez MRN: [id number] DOB: 1951/02/11 Today's Date: 07/06/2023  History of Present Illness  Pt is a 72 y.o. male presenting 9/27 with R weakness and dysequilibrium. MRI brain revealed acute/early subacute infarct of the medial inferior right cerebellum. CAD, EtOh use, HTN, HLD.  Clinical Impression  Pt admitted with above diagnosis. Independent and active PTA. During evaluation patient required Min assist +2 for sit to stand transfers and gait demonstrating poor LE coordination and rightward lean. Decreased trunk control sitting EOB without back support. Patient was able to make noticeable corrections to gait while using RW but ultimately stillr requires assistance for balance. (He is aware of right lean but has trouble correcting consistently.) Ataxic with poor RLE placement, but no overt buckling in WB. Patient will benefit from intensive inpatient follow up therapy, >3 hours/day.  Pt currently with functional limitations due to the deficits listed below (see PT Problem List). Pt will benefit from acute skilled PT to increase their independence and safety with mobility to allow discharge.           If plan is discharge home, recommend the following: A lot of help with walking and/or transfers;A little help with bathing/dressing/bathroom;Assistance with cooking/housework;Assist for transportation     Equipment Recommendations  (TBD next venue)  Recommendations for Other Services  Rehab consult    Functional Status Assessment Patient has had a recent decline in their functional status and demonstrates the ability to make significant improvements in function in a reasonable and predictable amount of time.     Precautions / Restrictions Precautions Precautions: Fall Restrictions Weight Bearing Restrictions: No      Mobility  Bed Mobility Overal bed mobility: Needs Assistance Bed Mobility: Supine to Sit     Supine to sit:  Contact guard, Used rails, HOB elevated     General bed mobility comments: for safety, slower, efforful, use of rail.    Transfers Overall transfer level: Needs assistance Equipment used: Rolling walker (2 wheels) Transfers: Sit to/from Stand Sit to Stand: Min assist, +2 safety/equipment           General transfer comment: Min assist for balance, good power-up, cues for hand placement and awareness of right lean. Good coordinated control with descent into recliner but reports feeling off center.    Ambulation/Gait Ambulation/Gait assistance: Min assist, +2 physical assistance, +2 safety/equipment Gait Distance (Feet): 65 Feet Assistive device: Rolling walker (2 wheels) Gait Pattern/deviations: Step-through pattern, Decreased step length - right, Decreased stance time - right, Decreased stride length, Decreased dorsiflexion - right, Decreased weight shift to left, Shuffle, Ataxic, Scissoring, Staggering right, Drifts right/left, Narrow base of support Gait velocity: decr Gait velocity interpretation: <1.31 ft/sec, indicative of household ambulator Pre-gait activities: weight shift lateral, a/p and progressed to marching in place with bil > SL UE support (drifting Rt.) General Gait Details: Min assist +2 at all times due to right lean and ataxic gait with RLE intermittently adduction into midline and stumbling. With extensive cues and education, pt able to slow gait, widen BOS, and demonstrate fair improvement in step symmetry about 50% of the time, but still needs assist to correct Rt sided lean. Performed with use of RW.  Stairs            Wheelchair Mobility     Tilt Bed    Modified Rankin (Stroke Patients Only) Modified Rankin (Stroke Patients Only) Pre-Morbid Rankin Score: No symptoms Modified Rankin: Moderately severe disability     Balance Overall  balance assessment: Needs assistance Sitting-balance support: No upper extremity supported, Feet supported Sitting  balance-Leahy Scale: Fair Sitting balance - Comments: R lateral lean/posterior lean durring LE MMT Postural control: Right lateral lean, Posterior lean Standing balance support: Single extremity supported Standing balance-Leahy Scale: Poor Standing balance comment: reliant on RW and external support                             Pertinent Vitals/Pain Pain Assessment Pain Assessment: No/denies pain    Home Living Family/patient expects to be discharged to:: Private residence Living Arrangements: Spouse/significant other Available Help at Discharge: Family Type of Home: House Home Access: Level entry       Home Layout: One level Home Equipment: Shower seat      Prior Function Prior Level of Function : Independent/Modified Independent;Driving                     Extremity/Trunk Assessment   Upper Extremity Assessment Upper Extremity Assessment: Defer to OT evaluation;Left hand dominant RUE Deficits / Details: Min decr coordination    Lower Extremity Assessment Lower Extremity Assessment: Generalized weakness       Communication   Communication Communication: No apparent difficulties Cueing Techniques: Verbal cues  Cognition Arousal: Alert Behavior During Therapy: WFL for tasks assessed/performed Overall Cognitive Status: Within Functional Limits for tasks assessed                                 General Comments: WFL for basic tasks and money management        General Comments      Exercises     Assessment/Plan    PT Assessment Patient needs continued PT services  PT Problem List Decreased strength;Decreased activity tolerance;Decreased balance;Decreased mobility;Decreased coordination;Decreased knowledge of use of DME       PT Treatment Interventions DME instruction;Gait training;Functional mobility training;Therapeutic activities;Therapeutic exercise;Balance training;Neuromuscular re-education;Patient/family education     PT Goals (Current goals can be found in the Care Plan section)  Acute Rehab PT Goals Patient Stated Goal: Get well PT Goal Formulation: With patient/family Time For Goal Achievement: 07/19/23 Potential to Achieve Goals: Good    Frequency Min 1X/week     Co-evaluation PT/OT/SLP Co-Evaluation/Treatment: Yes Reason for Co-Treatment: Complexity of the patient's impairments (multi-system involvement);For patient/therapist safety;To address functional/ADL transfers PT goals addressed during session: Mobility/safety with mobility;Balance;Proper use of DME;Strengthening/ROM OT goals addressed during session: ADL's and self-care       AM-PAC PT "6 Clicks" Mobility  Outcome Measure Help needed turning from your back to your side while in a flat bed without using bedrails?: A Little Help needed moving from lying on your back to sitting on the side of a flat bed without using bedrails?: A Little Help needed moving to and from a bed to a chair (including a wheelchair)?: A Little Help needed standing up from a chair using your arms (e.g., wheelchair or bedside chair)?: A Little Help needed to walk in hospital room?: A Lot Help needed climbing 3-5 steps with a railing? : A Lot 6 Click Score: 16    End of Session Equipment Utilized During Treatment: Gait belt Activity Tolerance: Patient tolerated treatment well Patient left: in chair;with call bell/phone within reach;with chair alarm set;with family/visitor present Nurse Communication: Mobility status PT Visit Diagnosis: Unsteadiness on feet (R26.81);Other abnormalities of gait and mobility (R26.89);Other symptoms and signs involving the  nervous system (R29.898)    Time: 6578-4696 PT Time Calculation (min) (ACUTE ONLY): 22 min   Charges:   PT Evaluation $PT Eval Moderate Complexity: 1 Mod   PT General Charges $$ ACUTE PT VISIT: 1 Visit         [name], PT, DPT [facility]  Rehabilitation Services Physical  Therapist Office: [phone] Website: [email or web address]   [name] 07/06/2023, 2:46 PM

## 2023-07-06 NOTE — Plan of Care (Signed)

## 2023-07-06 NOTE — Progress Notes (Addendum)
STROKE TEAM PROGRESS NOTE   BRIEF HPI [name] is a 72 y.o. male with history of CAD, EtOh use, HTN, HLD who presents with episode of dysequilibrium. Reports that he got up in the middle of night to use bathroom around 2AM. He was fine. Then got up around 4AM and his R leg was weak, he felt off balance and he had headache, going to right side of his face and numbness in BL uppers. The tingling went away in 10 mins. He is scheduled to have a lumbar puncture with Dr. Marjory Lies through [facility] for suspected NPH.  This will likely need to be rescheduled.  He plans to follow-up with Dr. Marjory Lies on Monday via telephone to update him on his stroke.  SIGNIFICANT HOSPITAL EVENTS   INTERIM HISTORY/SUBJECTIVE Sitting in his room with family at the bedside.  Extensive conversation regarding plan for aspirin and Plavix as well as outpatient treatment for suspected normal pressure hydrocephalus.  He does follow with Dr. Marjory Lies at [facility]. He has had problems for approximately 11 months with maintaining his balance, walking and lower extremity paresthesia. Endorses a 28-25-second episode of dizziness earlier in the day.  He has also intermittently had hiccups however these have currently subsided.  OBJECTIVE  CBC    Component Value Date/Time   WBC 9.4 07/06/2023 0602   RBC 4.60 07/06/2023 0602   HGB 14.2 07/06/2023 0602   HGB 15.5 01/18/2023 1053   HGB 11.8 (L) 10/14/2007 1541   HCT 42.7 07/06/2023 0602   HCT 46.5 01/18/2023 1053   HCT 35.2 (L) 10/14/2007 1541   PLT 298 07/06/2023 0602   PLT 350 01/18/2023 1053   MCV 92.8 07/06/2023 0602   MCV 92 01/18/2023 1053   MCV 96.4 10/14/2007 1541   MCH 30.9 07/06/2023 0602   MCHC 33.3 07/06/2023 0602   RDW 14.4 07/06/2023 0602   RDW 13.2 01/18/2023 1053   RDW 20.5 (H) 10/14/2007 1541   LYMPHSABS 0.7 07/05/2023 1543   LYMPHSABS 1.3 01/18/2023 1053   LYMPHSABS 0.1 (L) 10/14/2007 1541   MONOABS  0.8 07/05/2023 1543   MONOABS 0.2 10/14/2007 1541   EOSABS 0.0 07/05/2023 1543   EOSABS 0.1 01/18/2023 1053   BASOSABS 0.0 07/05/2023 1543   BASOSABS 0.1 01/18/2023 1053   BASOSABS 0.0 10/14/2007 1541    BMET    Component Value Date/Time   NA 134 (L) 07/06/2023 0602   NA 140 01/18/2023 1053   K 3.6 07/06/2023 0602   CL 99 07/06/2023 0602   CO2 25 07/06/2023 0602   GLUCOSE 95 07/06/2023 0602   BUN 18 07/06/2023 0602   BUN 19 01/18/2023 1053   CREATININE 1.36 (H) 07/06/2023 0602   CREATININE 1.21 12/28/2019 1501   CALCIUM 8.5 (L) 07/06/2023 0602   EGFR 66 01/18/2023 1053   GFRNONAA 55 (L) 07/06/2023 0602    IMAGING past 24 hours CT ANGIO HEAD NECK W WO CM  Result Date: 07/05/2023 CLINICAL DATA:  Right-sided weakness EXAM: CT ANGIOGRAPHY HEAD AND NECK WITH AND WITHOUT CONTRAST TECHNIQUE: Multidetector CT imaging of the head and neck was performed using the standard protocol during bolus administration of intravenous contrast. Multiplanar CT image reconstructions and MIPs were obtained to evaluate the vascular anatomy. Carotid stenosis measurements (when applicable) are obtained utilizing NASCET criteria, using the distal internal carotid diameter as the denominator. RADIATION DOSE REDUCTION: This exam was performed according to the departmental dose-optimization program which includes automated exposure control, adjustment of the mA  and/or kV according to patient size and/or use of iterative reconstruction technique. CONTRAST:  75mL OMNIPAQUE IOHEXOL 350 MG/ML SOLN COMPARISON:  None Available. FINDINGS: CTA NECK FINDINGS SKELETON: There is no bony spinal canal stenosis. No lytic or blastic lesion. OTHER NECK: Normal pharynx, larynx and major salivary glands. No cervical lymphadenopathy. Unremarkable thyroid gland. UPPER CHEST: Scarring/fibrosis at the paramediastinal right upper lobe. AORTIC ARCH: There is calcific atherosclerosis of the aortic arch. Conventional 3 vessel aortic branching  pattern. RIGHT CAROTID SYSTEM: There is calcific atherosclerosis within the distal common carotid artery and at the carotid bifurcation. There is less than 50% stenosis of the internal carotid artery. The ICA is normal distally. LEFT CAROTID SYSTEM: Calcific atherosclerosis at the carotid bifurcation and proximal ICA with less than 50% stenosis. VERTEBRAL ARTERIES: Codominant configuration.Moderately narrowed right V1 segment. Multifocal mild narrowing of the right V2 segment. The right V3 segment is normal. Left vertebral artery V1 and V2 segments are normal. There is mild beading of the left V3 segment, which remains widely patent. CTA HEAD FINDINGS POSTERIOR CIRCULATION: --Vertebral arteries: Moderate caliber change of the right V4 segment just distal to the origin of the right PICA. Normal left V4 segment. --Inferior cerebellar arteries: Normal. --Basilar artery: Normal. --Superior cerebellar arteries: Normal. --Posterior cerebral arteries (PCA): Normal. ANTERIOR CIRCULATION: --Intracranial internal carotid arteries: Atherosclerotic calcification of the internal carotid arteries at the skull base without hemodynamically significant stenosis. --Anterior cerebral arteries (ACA): Normal. Both A1 segments are present. Patent anterior communicating artery (a-comm). --Middle cerebral arteries (MCA): Normal. VENOUS SINUSES: As permitted by contrast timing, patent. ANATOMIC VARIANTS: None Review of the MIP images confirms the above findings. IMPRESSION: 1. No emergent large vessel occlusion. 2. Moderate narrowing of the right vertebral artery V4 segment just distal to the origin of the right PICA. 3. Moderately narrowed right V1 segment and multifocal mild narrowing of the right V2 segment. Irregularity of the left V3 segment, which remains widely patent. 4. Bilateral carotid bifurcation atherosclerosis with less than 50% stenosis. Aortic Atherosclerosis (ICD10-I70.0). Electronically Signed   By: Deatra Robinson M.D.    On: 07/05/2023 22:37   MR Brain Wo Contrast (neuro protocol)  Result Date: 07/05/2023 CLINICAL DATA:  Acute neurologic deficit EXAM: MRI HEAD WITHOUT CONTRAST TECHNIQUE: Multiplanar, multiecho pulse sequences of the brain and surrounding structures were obtained without intravenous contrast. COMPARISON:  06/01/2023 FINDINGS: Brain: Acute/early subacute infarct of the medial inferior right cerebellum. Fewer than 5 scattered microhemorrhages in a nonspecific pattern. Unchanged chronic ventriculomegaly. Normal brain parenchymal signal. The midline structures are normal. Vascular: Normal flow voids. Skull and upper cervical spine: Normal marrow signal. Sinuses/Orbits: Negative. Other: None. IMPRESSION: Acute/early subacute infarct of the medial inferior right cerebellum. Electronically Signed   By: Deatra Robinson M.D.   On: 07/05/2023 19:55   CT HEAD WO CONTRAST  Result Date: 07/05/2023 CLINICAL DATA:  Neuro deficit, acute, stroke suspected. Right-sided weakness. EXAM: CT HEAD WITHOUT CONTRAST TECHNIQUE: Contiguous axial images were obtained from the base of the skull through the vertex without intravenous contrast. RADIATION DOSE REDUCTION: This exam was performed according to the departmental dose-optimization program which includes automated exposure control, adjustment of the mA and/or kV according to patient size and/or use of iterative reconstruction technique. COMPARISON:  MRI 06/01/2023 FINDINGS: Brain: Mild volume loss and small vessel disease throughout the deep white matter. Ventriculomegaly noted which may be related to ex vacuo dilatation but appears somewhat out of proportion to volume loss. Cannot exclude normal pressure hydrocephalus. This is stable since prior MRI.  No hemorrhage or acute infarction. No midline shift. Vascular: No hyperdense vessel or unexpected calcification. Skull: No acute calvarial abnormality. Sinuses/Orbits: No acute findings Other: None IMPRESSION: Chronic ventriculomegaly,  stable since recent MRI. Consider normal pressure hydrocephalus in the appropriate clinical setting. Electronically Signed   By: Charlett Nose M.D.   On: 07/05/2023 17:29    Vitals:   07/05/23 2119 07/05/23 2130 07/05/23 2330 07/06/23 0716  BP: (!) 177/78 (!) 183/78 (!) 152/72 (!) 143/69  Pulse: 67 67 68 65  Resp: 20 13 18 17   Temp: 97.6 F (36.4 C)  97.6 F (36.4 C) 98.3 F (36.8 C)  TempSrc: Oral  Oral Oral  SpO2: 95% 96%  96%  Weight:   97.6 kg   Height:   5\' 10"  (1.778 m)      PHYSICAL EXAM General:  Alert, well-nourished, well-developed patient in no acute distress Psych:  Mood and affect appropriate for situation CV: Regular rate and rhythm on monitor Respiratory:  Regular, unlabored respirations on room air GI: Abdomen soft and nontender   NEURO:  Mental Status: AA&Ox3, patient is able to give clear and coherent history Speech/Language: speech is without dysarthria or aphasia.  Naming, repetition, fluency, and comprehension intact.  Cranial Nerves:  II: PERRL. Visual fields full.  III, IV, VI: EOMI. Eyelids elevate symmetrically.  V: Sensation is intact to light touch and symmetrical to face.  VII: Face is symmetrical resting and smiling VIII: hearing intact to voice. IX, X: Palate elevates symmetrically. Phonation is normal.  [id number] shrug 5/5. XII: tongue is midline without fasciculations. Motor: 5/5 strength to all muscle groups tested.  Tone: is normal and bulk is normal Sensation- Intact to light touch bilaterally. Extinction absent to light touch to DSS.   Coordination: FTN with slight dysmetria on the left, HKS: no ataxia in BLE.No drift.  Gait-steady with a standby assist  ASSESSMENT/PLAN  Acute Ischemic Infarct:  right medial inferior cerebellum infarct, etiology: Right vertebral artery stenosis, large vessel disease Code Stroke CT head - no acute ICH or infarct CTA head & neck Moderate narrowing of the right vertebral artery V4 segment just distal  to the origin of the right PICA. Moderately narrowed right V1 segment and multifocal mild narrowing of the right V2 segment. Irregularity of the left V3 segment, which remains widely patent. Bilateral carotid bifurcation atherosclerosis with less than 50% stenosis. MRI  Acute/early subacute infarct of the medial inferior right cerebellum.  2D Echo pending LDL 34 HgbA1c 5.8 UDS neg VTE prophylaxis - Lovenox clopidogrel 75 mg daily prior to admission, now on aspirin 81 mg daily and clopidogrel 75 mg daily for 3 weeks and then ASA 81mg  alone. Therapy recommendations:  CIR Disposition:  Inpatient  Suspected normal pressure hydrocephalus CT head Chronic ventriculomegaly, stable since recent MRI.  Symptoms have been persistent for the past 11 months, currently following with Dr. Marjory Lies at [facility] Originally scheduled for an LP next week, however we did discuss having to postpone this given his new medication regimen and stroke diagnosis He currently follows with Dr. Marjory Lies, daughter plans to contact the office on Monday. Recommend LP reschedule to after 3 weeks of DAPT.   Hypertension Home meds: Cardizem, metoprolol  Stable Long term BP goal normotensive  Hyperlipidemia Home meds: Crestor 40, resumed in hospital LDL 34, goal < 70 Continue statin at discharge  Other Stroke Risk Factors ETOH use, alcohol level <10, advised to drink no more than 2 drink(s) a day Obesity, Body mass index is  30.87 kg/m., BMI >/= 30 associated with increased stroke risk, recommend weight loss, diet and exercise as appropriate  CAD s/p PCI and DES  Other acute issues AKI Cre 1.06->1.36, off IVF  Hospital day # 0  Patient seen and examined by NP/APP with MD. MD to update note as needed.   Elmer Picker, DNP, FNP-BC [facility] Pager: [phone]  ATTENDING NOTE: I reviewed above note and agree with the assessment and plan. Pt was seen and examined.   Wife  and daughter are at the bedside. Pt sitting in chair, stated that his imbalance has improved from yesterday but his baseline walking difficulty still there. No focal deficit on exam. Pt supposed to have LP next Tuesday but given current stroke and DAPT will recommend to reschedule after 3 weeks of DAPT. Continue statin. PT and OT recommend CIR. Pt will continue follow up with Dr. Marjory Lies at [facility].   For detailed assessment and plan, please refer to above/below as I have made changes wherever appropriate.   Neurology will sign off. Please call with questions. Pt will follow up with Dr Marjory Lies at [facility] in about 3-4 weeks. Thanks for the consult.   Marvel Plan, MD PhD Stroke Neurology 07/06/2023 6:14 PM    To contact Stroke Continuity provider, please refer to [email or web address]. After hours, contact General Neurology

## 2023-07-07 ENCOUNTER — Inpatient Hospital Stay (HOSPITAL_COMMUNITY): Payer: HMO

## 2023-07-07 DIAGNOSIS — Z7982 Long term (current) use of aspirin: Secondary | ICD-10-CM | POA: Diagnosis not present

## 2023-07-07 DIAGNOSIS — Z7984 Long term (current) use of oral hypoglycemic drugs: Secondary | ICD-10-CM | POA: Diagnosis not present

## 2023-07-07 DIAGNOSIS — G629 Polyneuropathy, unspecified: Secondary | ICD-10-CM | POA: Diagnosis not present

## 2023-07-07 DIAGNOSIS — R42 Dizziness and giddiness: Secondary | ICD-10-CM | POA: Diagnosis not present

## 2023-07-07 DIAGNOSIS — R2689 Other abnormalities of gait and mobility: Secondary | ICD-10-CM | POA: Diagnosis not present

## 2023-07-07 DIAGNOSIS — I2510 Atherosclerotic heart disease of native coronary artery without angina pectoris: Secondary | ICD-10-CM | POA: Diagnosis not present

## 2023-07-07 DIAGNOSIS — G8191 Hemiplegia, unspecified affecting right dominant side: Secondary | ICD-10-CM | POA: Diagnosis not present

## 2023-07-07 DIAGNOSIS — Z7902 Long term (current) use of antithrombotics/antiplatelets: Secondary | ICD-10-CM | POA: Diagnosis not present

## 2023-07-07 DIAGNOSIS — I69351 Hemiplegia and hemiparesis following cerebral infarction affecting right dominant side: Secondary | ICD-10-CM | POA: Diagnosis not present

## 2023-07-07 DIAGNOSIS — Z66 Do not resuscitate: Secondary | ICD-10-CM | POA: Diagnosis not present

## 2023-07-07 DIAGNOSIS — R262 Difficulty in walking, not elsewhere classified: Secondary | ICD-10-CM | POA: Diagnosis not present

## 2023-07-07 DIAGNOSIS — J4489 Other specified chronic obstructive pulmonary disease: Secondary | ICD-10-CM | POA: Diagnosis not present

## 2023-07-07 DIAGNOSIS — I6501 Occlusion and stenosis of right vertebral artery: Secondary | ICD-10-CM | POA: Diagnosis not present

## 2023-07-07 DIAGNOSIS — I5032 Chronic diastolic (congestive) heart failure: Secondary | ICD-10-CM | POA: Diagnosis not present

## 2023-07-07 DIAGNOSIS — I639 Cerebral infarction, unspecified: Secondary | ICD-10-CM | POA: Diagnosis not present

## 2023-07-07 DIAGNOSIS — I110 Hypertensive heart disease with heart failure: Secondary | ICD-10-CM | POA: Diagnosis not present

## 2023-07-07 DIAGNOSIS — G9389 Other specified disorders of brain: Secondary | ICD-10-CM | POA: Diagnosis not present

## 2023-07-07 DIAGNOSIS — E785 Hyperlipidemia, unspecified: Secondary | ICD-10-CM | POA: Diagnosis not present

## 2023-07-07 DIAGNOSIS — Z955 Presence of coronary angioplasty implant and graft: Secondary | ICD-10-CM | POA: Diagnosis not present

## 2023-07-07 DIAGNOSIS — I63549 Cerebral infarction due to unspecified occlusion or stenosis of unspecified cerebellar artery: Secondary | ICD-10-CM | POA: Diagnosis not present

## 2023-07-07 DIAGNOSIS — E669 Obesity, unspecified: Secondary | ICD-10-CM | POA: Diagnosis not present

## 2023-07-07 DIAGNOSIS — H9201 Otalgia, right ear: Secondary | ICD-10-CM | POA: Diagnosis not present

## 2023-07-07 DIAGNOSIS — J45909 Unspecified asthma, uncomplicated: Secondary | ICD-10-CM | POA: Diagnosis not present

## 2023-07-07 DIAGNOSIS — J418 Mixed simple and mucopurulent chronic bronchitis: Secondary | ICD-10-CM | POA: Diagnosis not present

## 2023-07-07 DIAGNOSIS — I69398 Other sequelae of cerebral infarction: Secondary | ICD-10-CM | POA: Diagnosis not present

## 2023-07-07 DIAGNOSIS — F1010 Alcohol abuse, uncomplicated: Secondary | ICD-10-CM | POA: Diagnosis not present

## 2023-07-07 DIAGNOSIS — G912 (Idiopathic) normal pressure hydrocephalus: Secondary | ICD-10-CM | POA: Diagnosis not present

## 2023-07-07 DIAGNOSIS — F32A Depression, unspecified: Secondary | ICD-10-CM | POA: Diagnosis not present

## 2023-07-07 DIAGNOSIS — Z23 Encounter for immunization: Secondary | ICD-10-CM | POA: Diagnosis not present

## 2023-07-07 DIAGNOSIS — J411 Mucopurulent chronic bronchitis: Secondary | ICD-10-CM | POA: Diagnosis not present

## 2023-07-07 DIAGNOSIS — R058 Other specified cough: Secondary | ICD-10-CM | POA: Diagnosis not present

## 2023-07-07 DIAGNOSIS — Z79899 Other long term (current) drug therapy: Secondary | ICD-10-CM | POA: Diagnosis not present

## 2023-07-07 DIAGNOSIS — E782 Mixed hyperlipidemia: Secondary | ICD-10-CM | POA: Diagnosis not present

## 2023-07-07 DIAGNOSIS — I63211 Cerebral infarction due to unspecified occlusion or stenosis of right vertebral arteries: Secondary | ICD-10-CM | POA: Diagnosis not present

## 2023-07-07 DIAGNOSIS — R269 Unspecified abnormalities of gait and mobility: Secondary | ICD-10-CM | POA: Diagnosis not present

## 2023-07-07 DIAGNOSIS — E871 Hypo-osmolality and hyponatremia: Secondary | ICD-10-CM | POA: Diagnosis not present

## 2023-07-07 DIAGNOSIS — I10 Essential (primary) hypertension: Secondary | ICD-10-CM | POA: Diagnosis not present

## 2023-07-07 DIAGNOSIS — F329 Major depressive disorder, single episode, unspecified: Secondary | ICD-10-CM | POA: Diagnosis not present

## 2023-07-07 DIAGNOSIS — R29700 NIHSS score 0: Secondary | ICD-10-CM | POA: Diagnosis not present

## 2023-07-07 DIAGNOSIS — I63541 Cerebral infarction due to unspecified occlusion or stenosis of right cerebellar artery: Secondary | ICD-10-CM | POA: Diagnosis not present

## 2023-07-07 DIAGNOSIS — I6389 Other cerebral infarction: Secondary | ICD-10-CM

## 2023-07-07 DIAGNOSIS — R519 Headache, unspecified: Secondary | ICD-10-CM | POA: Diagnosis not present

## 2023-07-07 DIAGNOSIS — Z8249 Family history of ischemic heart disease and other diseases of the circulatory system: Secondary | ICD-10-CM | POA: Diagnosis not present

## 2023-07-07 DIAGNOSIS — Z85118 Personal history of other malignant neoplasm of bronchus and lung: Secondary | ICD-10-CM | POA: Diagnosis not present

## 2023-07-07 DIAGNOSIS — Z87891 Personal history of nicotine dependence: Secondary | ICD-10-CM | POA: Diagnosis not present

## 2023-07-07 LAB — ECHOCARDIOGRAM COMPLETE
Area-P 1/2: 2.32 cm2
Calc EF: 61.1 %
Height: 70 in
MV VTI: 3.42 cm2
S' Lateral: 2.2 cm
Single Plane A2C EF: 57.3 %
Single Plane A4C EF: 64.9 %
Weight: 3442.7 [oz_av]

## 2023-07-07 NOTE — Progress Notes (Signed)
  Echocardiogram 2D Echocardiogram has been performed.  Milda Smart 07/07/2023, 5:34 PM

## 2023-07-07 NOTE — Progress Notes (Signed)
PROGRESS NOTE    Jeremy Lopez  GLO:[id number] DOB: 10-Feb-1951 DOA: 07/05/2023 PCP: [name], MD    Brief Narrative:   Jeremy Lopez is a 72 y.o. male with past medical history significant for essential hypertension, CAD s/p PCI/DES, HLD, major depressive disorder, Hx lung cancer s/p chemotherapy/radiation, asthmatic bronchitis, EtOH use, former tobacco abuse who presented to [facility] ED on 9/27 via EMS for right-sided weakness, headache, disequilibrium.  Also endorsed paresthesias to right side of face and bilateral upper extremities that subsided in 10 minutes.  Initial symptoms onset at 4 AM in which he was feeling fine at 2 AM.  Given patient having significant trouble getting in downstairs, EMS was activated and patient was brought to ED for further evaluation.  In the ED, temperature 97.8 F, HR 75, RR 14, BP 158/74, SpO2 94% on room air.  WBC 10.8, hemoglobin 14.6, platelet count 339.  Sodium 134, potassium 3.6, chloride 101, CO2 22, glucose 110, BUN 16, creat 1.06.  AST 12, ALT 15, total bili Ruben 0.8.  Total cholesterol 107, LDL 38, HDL 48.  Urinalysis unrevealing.  UDS negative.  EtOH level less than 10.  CT head without contrast with chronic ventriculomegaly, stable since recent MRI.  CT angiogram head/neck with no large vessel occlusion.  MRI brain without contrast with early subacute versus acute infarct medial inferior right cerebellum.  EKG with NSR.  Neurology was consulted, recommended transfer to [facility] for continued stroke evaluation.  TRH consulted for admission and patient was transferred to [facility].  Assessment & Plan:    Acute ischemic cerebellar CVA Patient presenting to ED with disequilibrium, right sided weakness with paresthesias.  Last known normal 2 AM on 07/06/2023.  CT head with noted chronic ventriculomegaly.  CT angiogram head/neck with no large vessel occlusion.  MRI brain without contrast with acute versus early  subacute infarct medial inferior right cerebellum.  LDL 34, hemoglobin A1c 5.7.  Seen by neurology with recommendation of aspirin and Plavix x 3 weeks followed by aspirin alone.  Seen by PT/OT with recommendation of CIR placement. -- TTE: Pending -- Aspirin 81 mg p.o. daily -- Plavix 75 mg p.o. daily -- Continue monitor on telemetry -- Pending CIR placement -- Outpatient follow-up with neurology 3-4 weeks  Concern for normal pressure hydrocephalus CT head with chronic ventriculomegaly, stable since recent MRI.  Symptoms have persisted over the last 11 months, currently following with neurology, Dr. Marjory Lies outpatient.  Was scheduled for an LP this upcoming week but will have to postpone given new stroke diagnosis. -- Recommend LP reschedule after 3 weeks of DAPT.  Essential hypertension Home regimen includes diltiazem 240 mg p.o. daily, metoprolol succinate 100 mg p.o. daily. -- Plan to restart home antibiotics as tomorrow.  CAD s/p PCI/DES Follows with cardiology outpatient.  Hyperlipidemia Total cholesterol 107, LDL 38, HDL 48. -- Continue home Crestor 40 mg p.o. daily  Major depressive disorder -- Lexapro 10 mg p.o. daily  History of lung cancer s/p chemotherapy/radiation Follows with pulmonology outpatient, continue outpatient pulmonary rehab.  Asthmatic bronchitis Former tobacco use disorder Follow with pulmonology outpatient, Dr. Tonia Brooms.  On Trelegy Ellipta outpatient. -- Continue Breo Ellipta and Incruse Ellipta as hospital substitution 1 puff daily  EtOH use disorder Counseled on need for complete cessation/abstinence.  Obese Body mass index is 30.87 kg/m.  Complicates all facets of care  DVT prophylaxis: enoxaparin (LOVENOX) injection 40 mg Start: 07/06/23 1000 SCD's Start: 07/05/23 2347  Code Status: Limited: Do not attempt resuscitation (DNR) -DNR-LIMITED -Do Not Intubate/DNI  Family Communication:   Disposition Plan:  Level of care: Telemetry  Medical Status is: Inpatient Remains inpatient appropriate because: Medically stable for discharge to CIR once bed available with insurance authorization   Consultants:  Neurology  Procedures:  TTE: Pending  Antimicrobials:  None   Subjective: Patient seen examined bedside, resting comfortably.  Lying in bed eating breakfast.  Disequilibrium improved.  Neurology now signed off with recommendations by therapy for CIR admission. No other specific complaints or concerns at this time.  Denies headache, no dizziness, no chest pain, no palpitation, no shortness of breath, no abdominal pain, no fever/chills/night sweats, no nausea/vomiting/diarrhea, no focal weakness, no fatigue, no paresthesias.  No acute events overnight per nurse staff.  Medically stable for discharge to CIR once bed available with insurance authorization  Objective: Vitals:   07/06/23 2324 07/07/23 0333 07/07/23 0751 07/07/23 0900  BP: 113/67 131/70 (!) 154/81 (!) 140/79  Pulse: 83 80 80 81  Resp: 18  16 17   Temp: 98.6 F (37 C) (!) 97.5 F (36.4 C) 98.1 F (36.7 C) 98 F (36.7 C)  TempSrc: Oral Oral Oral Oral  SpO2: 96% 95% 96% 97%  Weight:      Height:        Intake/Output Summary (Last 24 hours) at 07/07/2023 1131 Last data filed at 07/07/2023 0700 Gross per 24 hour  Intake --  Output 500 ml  Net -500 ml   Filed Weights   07/05/23 1507 07/05/23 2330  Weight: 97.7 kg 97.6 kg    Examination:  Physical Exam: GEN: NAD, alert and oriented x 3, obese HEENT: NCAT, PERRL, EOMI, sclera clear, MMM PULM: CTAB w/o wheezes/crackles, normal respiratory effort, on room air CV: RRR w/o M/G/R GI: abd soft, NTND, NABS, no R/G/M MSK: no peripheral edema, muscle strength globally intact 5/5 bilateral upper/lower extremities NEURO: No focal neurological deficits, gait not evaluated PSYCH: normal mood/affect Integumentary: dry/intact, no rashes or wounds    Data Reviewed: I have personally reviewed following  labs and imaging studies  CBC: Recent Labs  Lab 07/05/23 1543 07/06/23 0602  WBC 10.8* 9.4  NEUTROABS 9.2*  --   HGB 14.6 14.2  HCT 43.8 42.7  MCV 91.6 92.8  PLT 339 298   Basic Metabolic Panel: Recent Labs  Lab 07/05/23 1543 07/06/23 0602  NA 134* 134*  K 3.6 3.6  CL 101 99  CO2 22 25  GLUCOSE 110* 95  BUN 16 18  CREATININE 1.06 1.36*  CALCIUM 8.9 8.5*   GFR: Estimated Creatinine Clearance: 57.5 mL/min (A) (by C-G formula based on SCr of 1.36 mg/dL (H)). Liver Function Tests: Recent Labs  Lab 07/05/23 1543 07/06/23 0602  AST 12* 11*  ALT 15 13  ALKPHOS 69 61  BILITOT 0.8 0.6  PROT 6.8 6.0*  ALBUMIN 3.8 3.3*   No results for input(s): "LIPASE", "AMYLASE" in the last 168 hours. No results for input(s): "AMMONIA" in the last 168 hours. Coagulation Profile: Recent Labs  Lab 07/05/23 1543  INR 1.0   Cardiac Enzymes: No results for input(s): "CKTOTAL", "CKMB", "CKMBINDEX", "TROPONINI" in the last 168 hours. BNP (last 3 results) No results for input(s): "PROBNP" in the last 8760 hours. HbA1C: Recent Labs    07/05/23 1543  HGBA1C 5.7*   CBG: No results for input(s): "GLUCAP" in the last 168 hours. Lipid Profile: Recent Labs    07/05/23 1543 07/06/23 0602  CHOL 107 98  HDL  48 42  LDLCALC 38 34  TRIG 104 109  CHOLHDL 2.2 2.3   Thyroid Function Tests: No results for input(s): "TSH", "T4TOTAL", "FREET4", "T3FREE", "THYROIDAB" in the last 72 hours. Anemia Panel: No results for input(s): "VITAMINB12", "FOLATE", "FERRITIN", "TIBC", "IRON", "RETICCTPCT" in the last 72 hours. Sepsis Labs: No results for input(s): "PROCALCITON", "LATICACIDVEN" in the last 168 hours.  No results found for this or any previous visit (from the past 240 hour(s)).       Radiology Studies: CT ANGIO HEAD NECK W WO CM  Result Date: 07/05/2023 CLINICAL DATA:  Right-sided weakness EXAM: CT ANGIOGRAPHY HEAD AND NECK WITH AND WITHOUT CONTRAST TECHNIQUE: Multidetector CT  imaging of the head and neck was performed using the standard protocol during bolus administration of intravenous contrast. Multiplanar CT image reconstructions and MIPs were obtained to evaluate the vascular anatomy. Carotid stenosis measurements (when applicable) are obtained utilizing NASCET criteria, using the distal internal carotid diameter as the denominator. RADIATION DOSE REDUCTION: This exam was performed according to the departmental dose-optimization program which includes automated exposure control, adjustment of the mA and/or kV according to patient size and/or use of iterative reconstruction technique. CONTRAST:  75mL OMNIPAQUE IOHEXOL 350 MG/ML SOLN COMPARISON:  None Available. FINDINGS: CTA NECK FINDINGS SKELETON: There is no bony spinal canal stenosis. No lytic or blastic lesion. OTHER NECK: Normal pharynx, larynx and major salivary glands. No cervical lymphadenopathy. Unremarkable thyroid gland. UPPER CHEST: Scarring/fibrosis at the paramediastinal right upper lobe. AORTIC ARCH: There is calcific atherosclerosis of the aortic arch. Conventional 3 vessel aortic branching pattern. RIGHT CAROTID SYSTEM: There is calcific atherosclerosis within the distal common carotid artery and at the carotid bifurcation. There is less than 50% stenosis of the internal carotid artery. The ICA is normal distally. LEFT CAROTID SYSTEM: Calcific atherosclerosis at the carotid bifurcation and proximal ICA with less than 50% stenosis. VERTEBRAL ARTERIES: Codominant configuration.Moderately narrowed right V1 segment. Multifocal mild narrowing of the right V2 segment. The right V3 segment is normal. Left vertebral artery V1 and V2 segments are normal. There is mild beading of the left V3 segment, which remains widely patent. CTA HEAD FINDINGS POSTERIOR CIRCULATION: --Vertebral arteries: Moderate caliber change of the right V4 segment just distal to the origin of the right PICA. Normal left V4 segment. --Inferior cerebellar  arteries: Normal. --Basilar artery: Normal. --Superior cerebellar arteries: Normal. --Posterior cerebral arteries (PCA): Normal. ANTERIOR CIRCULATION: --Intracranial internal carotid arteries: Atherosclerotic calcification of the internal carotid arteries at the skull base without hemodynamically significant stenosis. --Anterior cerebral arteries (ACA): Normal. Both A1 segments are present. Patent anterior communicating artery (a-comm). --Middle cerebral arteries (MCA): Normal. VENOUS SINUSES: As permitted by contrast timing, patent. ANATOMIC VARIANTS: None Review of the MIP images confirms the above findings. IMPRESSION: 1. No emergent large vessel occlusion. 2. Moderate narrowing of the right vertebral artery V4 segment just distal to the origin of the right PICA. 3. Moderately narrowed right V1 segment and multifocal mild narrowing of the right V2 segment. Irregularity of the left V3 segment, which remains widely patent. 4. Bilateral carotid bifurcation atherosclerosis with less than 50% stenosis. Aortic Atherosclerosis (ICD10-I70.0). Electronically Signed   By: Deatra Robinson M.D.   On: 07/05/2023 22:37   MR Brain Wo Contrast (neuro protocol)  Result Date: 07/05/2023 CLINICAL DATA:  Acute neurologic deficit EXAM: MRI HEAD WITHOUT CONTRAST TECHNIQUE: Multiplanar, multiecho pulse sequences of the brain and surrounding structures were obtained without intravenous contrast. COMPARISON:  06/01/2023 FINDINGS: Brain: Acute/early subacute infarct of the medial inferior right  cerebellum. Fewer than 5 scattered microhemorrhages in a nonspecific pattern. Unchanged chronic ventriculomegaly. Normal brain parenchymal signal. The midline structures are normal. Vascular: Normal flow voids. Skull and upper cervical spine: Normal marrow signal. Sinuses/Orbits: Negative. Other: None. IMPRESSION: Acute/early subacute infarct of the medial inferior right cerebellum. Electronically Signed   By: Deatra Robinson M.D.   On: 07/05/2023  19:55   CT HEAD WO CONTRAST  Result Date: 07/05/2023 CLINICAL DATA:  Neuro deficit, acute, stroke suspected. Right-sided weakness. EXAM: CT HEAD WITHOUT CONTRAST TECHNIQUE: Contiguous axial images were obtained from the base of the skull through the vertex without intravenous contrast. RADIATION DOSE REDUCTION: This exam was performed according to the departmental dose-optimization program which includes automated exposure control, adjustment of the mA and/or kV according to patient size and/or use of iterative reconstruction technique. COMPARISON:  MRI 06/01/2023 FINDINGS: Brain: Mild volume loss and small vessel disease throughout the deep white matter. Ventriculomegaly noted which may be related to ex vacuo dilatation but appears somewhat out of proportion to volume loss. Cannot exclude normal pressure hydrocephalus. This is stable since prior MRI. No hemorrhage or acute infarction. No midline shift. Vascular: No hyperdense vessel or unexpected calcification. Skull: No acute calvarial abnormality. Sinuses/Orbits: No acute findings Other: None IMPRESSION: Chronic ventriculomegaly, stable since recent MRI. Consider normal pressure hydrocephalus in the appropriate clinical setting. Electronically Signed   By: Charlett Nose M.D.   On: 07/05/2023 17:29        Scheduled Meds:  aspirin EC  81 mg Oral Daily   clopidogrel  75 mg Oral Daily   enoxaparin (LOVENOX) injection  40 mg Subcutaneous Q24H   escitalopram  10 mg Oral Daily   fluticasone furoate-vilanterol  1 puff Inhalation Daily   And   umeclidinium bromide  1 puff Inhalation Daily   gabapentin  300 mg Oral QHS   rosuvastatin  40 mg Oral Daily   traZODone  50 mg Oral QHS   Continuous Infusions:   LOS: 0 days    Time spent: 54 minutes spent on chart review, discussion with nursing staff, consultants, updating family and interview/physical exam; more than 50% of that time was spent in counseling and/or coordination of care.    Alvira Philips  [place], DO [facility] Available via Epic secure chat 7am-7pm After these hours, please refer to coverage provider listed on [email or web address] 07/07/2023, 11:31 AM

## 2023-07-07 NOTE — Plan of Care (Signed)
  Problem: Education: Goal: Knowledge of General Education information will improve Description: Including pain rating scale, medication(s)/side effects and non-pharmacologic comfort measures Outcome: Progressing   Problem: Clinical Measurements: Goal: Ability to maintain clinical measurements within normal limits will improve Outcome: Progressing   Problem: Activity: Goal: Risk for activity intolerance will decrease Outcome: Progressing   Problem: Coping: Goal: Level of anxiety will decrease Outcome: Progressing   Problem: Pain Managment: Goal: General experience of comfort will improve Outcome: Progressing   Problem: Education: Goal: Knowledge of disease or condition will improve Outcome: Progressing

## 2023-07-08 DIAGNOSIS — G912 (Idiopathic) normal pressure hydrocephalus: Secondary | ICD-10-CM | POA: Diagnosis not present

## 2023-07-08 DIAGNOSIS — I639 Cerebral infarction, unspecified: Secondary | ICD-10-CM | POA: Diagnosis not present

## 2023-07-08 DIAGNOSIS — I63211 Cerebral infarction due to unspecified occlusion or stenosis of right vertebral arteries: Secondary | ICD-10-CM | POA: Diagnosis not present

## 2023-07-08 MED ORDER — METOPROLOL SUCCINATE ER 100 MG PO TB24
100.0000 mg | ORAL_TABLET | Freq: Every day | ORAL | Status: DC
  Administered 2023-07-08 – 2023-07-12 (×5): 100 mg via ORAL
  Filled 2023-07-08 (×5): qty 1

## 2023-07-08 MED ORDER — LORATADINE 10 MG PO TABS
10.0000 mg | ORAL_TABLET | Freq: Every day | ORAL | Status: DC
  Administered 2023-07-08 – 2023-07-13 (×6): 10 mg via ORAL
  Filled 2023-07-08 (×6): qty 1

## 2023-07-08 MED ORDER — DILTIAZEM HCL ER COATED BEADS 240 MG PO CP24
240.0000 mg | ORAL_CAPSULE | Freq: Every day | ORAL | Status: DC
  Administered 2023-07-09 – 2023-07-12 (×4): 240 mg via ORAL
  Filled 2023-07-08 (×4): qty 1

## 2023-07-08 MED ORDER — HYDROCODONE BIT-HOMATROP MBR 5-1.5 MG/5ML PO SOLN
5.0000 mL | ORAL | Status: DC | PRN
  Administered 2023-07-08 – 2023-07-13 (×8): 5 mL via ORAL
  Filled 2023-07-08 (×9): qty 5

## 2023-07-08 NOTE — Progress Notes (Signed)
PROGRESS NOTE    Jeremy Lopez  NFA:[id number] DOB: [birth year] DOA: 07/05/2023 PCP: [name], MD    Brief Narrative:   Jeremy Lopez is a 72 y.o. male with past medical history significant for essential hypertension, CAD s/p PCI/DES, HLD, major depressive disorder, Hx lung cancer s/p chemotherapy/radiation, asthmatic bronchitis, EtOH use, former tobacco abuse who presented to [facility] ED on 9/27 via EMS for right-sided weakness, headache, disequilibrium.  Also endorsed paresthesias to right side of face and bilateral upper extremities that subsided in 10 minutes.  Initial symptoms onset at 4 AM in which he was feeling fine at 2 AM.  Given patient having significant trouble getting in downstairs, EMS was activated and patient was brought to ED for further evaluation.  In the ED, temperature 97.8 F, HR 75, RR 14, BP 158/74, SpO2 94% on room air.  WBC 10.8, hemoglobin 14.6, platelet count 339.  Sodium 134, potassium 3.6, chloride 101, CO2 22, glucose 110, BUN 16, creat 1.06.  AST 12, ALT 15, total bili Ruben 0.8.  Total cholesterol 107, LDL 38, HDL 48.  Urinalysis unrevealing.  UDS negative.  EtOH level less than 10.  CT head without contrast with chronic ventriculomegaly, stable since recent MRI.  CT angiogram head/neck with no large vessel occlusion.  MRI brain without contrast with early subacute versus acute infarct medial inferior right cerebellum.  EKG with NSR.  Neurology was consulted, recommended transfer to [facility] for continued stroke evaluation.  TRH consulted for admission and patient was transferred to [facility].  Assessment & Plan:    Acute ischemic cerebellar CVA Patient presenting to ED with disequilibrium, right sided weakness with paresthesias.  Last known normal 2 AM on 07/06/2023.  CT head with noted chronic ventriculomegaly.  CT angiogram head/neck with no large vessel occlusion.  MRI brain without contrast with acute versus early  subacute infarct medial inferior right cerebellum.  LDL 34, hemoglobin A1c 5.7.  TTE with LVEF 60 to 65%, mild LVH, grade 1 diastolic dysfunction, IVC normal in size.  Seen by neurology with recommendation of aspirin and Plavix x 3 weeks followed by aspirin alone.  Seen by PT/OT with recommendation of CIR placement.  Outpatient follow-up with neurology 3-4 weeks. -- Pending CIR placement; medically stable for discharge once bed available and receives insurance authorization  Concern for normal pressure hydrocephalus CT head with chronic ventriculomegaly, stable since recent MRI.  Symptoms have persisted over the last 11 months, currently following with neurology, Dr. Marjory Lies outpatient.  Was scheduled for an LP this upcoming week but will have to postpone given new stroke diagnosis. -- Recommend LP reschedule after 3 weeks of DAPT.  Essential hypertension Home regimen includes diltiazem 240 mg p.o. daily, metoprolol succinate 100 mg p.o. daily. -- Restart home metoprolol succinate 100 mg p.o. daily today -- Diltiazem 240 mg p.o. daily; start tomorrow   CAD s/p PCI/DES Follows with cardiology outpatient.  Hyperlipidemia Total cholesterol 107, LDL 38, HDL 48. -- Continue home Crestor 40 mg p.o. daily  Major depressive disorder -- Lexapro 10 mg p.o. daily  History of lung cancer s/p chemotherapy/radiation Follows with pulmonology outpatient, continue outpatient pulmonary rehab.  Asthmatic bronchitis Former tobacco use disorder Follow with pulmonology outpatient, Dr. Tonia Brooms.  On Trelegy Ellipta outpatient. -- Continue Breo Ellipta and Incruse Ellipta as hospital substitution 1 puff daily  EtOH use disorder Counseled on need for complete cessation/abstinence.  Obese Body mass index is 30.87 kg/m.  Complicates all facets of  48 42  LDLCALC 38 34  TRIG 104 109  CHOLHDL 2.2 2.3   Thyroid Function Tests: No results for input(s): "TSH", "T4TOTAL", "FREET4", "T3FREE", "THYROIDAB" in the last 72 hours. Anemia Panel: No results for input(s): "VITAMINB12", "FOLATE", "FERRITIN", "TIBC", "IRON", "RETICCTPCT" in the last 72 hours. Sepsis Labs: No results for input(s): "PROCALCITON", "LATICACIDVEN" in the last 168 hours.  No results found for this or any previous visit (from the past 240 hour(s)).       Radiology Studies: ECHOCARDIOGRAM COMPLETE  Result Date: 07/07/2023    ECHOCARDIOGRAM REPORT   Patient Name:   Jeremy Lopez Date of Exam: 07/07/2023 Medical Rec #:  [id number]      Height:       70.0 in Accession #:     [id number]     Weight:       215.2 lb Date of Birth:  1951-01-13      BSA:          2.153 m Patient Age:    72 years       BP:           149/81 mmHg Patient Gender: M              HR:           84 bpm. Exam Location:  Inpatient Procedure: 2D Echo, Color Doppler and Cardiac Doppler Indications:    Stroke  History:        Patient has prior history of Echocardiogram examinations, most                 recent 06/14/2022. HFpEF, CAD, Arrythmias:PVC; Risk                 Factors:Dyslipidemia and Hypertension. Lung CA.  Sonographer:    Milda Smart Referring Phys: [id number] ASIA B ZIERLE-GHOSH IMPRESSIONS  1. Left ventricular ejection fraction, by estimation, is 60 to 65%. The left ventricle has normal function. The left ventricle has no regional wall motion abnormalities. There is mild left ventricular hypertrophy. Left ventricular diastolic parameters are consistent with Grade I diastolic dysfunction (impaired relaxation).  2. Right ventricular systolic function is normal. The right ventricular size is normal. Tricuspid regurgitation signal is inadequate for assessing PA pressure.  3. The mitral valve is normal in structure. No evidence of mitral valve regurgitation.  4. The aortic valve is tricuspid. Aortic valve regurgitation is not visualized. Aortic valve sclerosis is present, with no evidence of aortic valve stenosis.  5. The inferior vena cava is normal in size with greater than 50% respiratory variability, suggesting right atrial pressure of 3 mmHg. FINDINGS  Left Ventricle: Left ventricular ejection fraction, by estimation, is 60 to 65%. The left ventricle has normal function. The left ventricle has no regional wall motion abnormalities. The left ventricular internal cavity size was small. There is mild left ventricular hypertrophy. Left ventricular diastolic parameters are consistent with Grade I diastolic dysfunction (impaired relaxation). Right Ventricle: The right ventricular size is normal. No increase in  right ventricular wall thickness. Right ventricular systolic function is normal. Tricuspid regurgitation signal is inadequate for assessing PA pressure. Left Atrium: Left atrial size was normal in size. Right Atrium: Right atrial size was normal in size. Pericardium: There is no evidence of pericardial effusion. Mitral Valve: The mitral valve is normal in structure. No evidence of mitral valve regurgitation. MV peak gradient, 5.3 mmHg. The mean mitral valve gradient is 3.0 mmHg. Tricuspid Valve: The tricuspid valve is normal in structure. Tricuspid  48 42  LDLCALC 38 34  TRIG 104 109  CHOLHDL 2.2 2.3   Thyroid Function Tests: No results for input(s): "TSH", "T4TOTAL", "FREET4", "T3FREE", "THYROIDAB" in the last 72 hours. Anemia Panel: No results for input(s): "VITAMINB12", "FOLATE", "FERRITIN", "TIBC", "IRON", "RETICCTPCT" in the last 72 hours. Sepsis Labs: No results for input(s): "PROCALCITON", "LATICACIDVEN" in the last 168 hours.  No results found for this or any previous visit (from the past 240 hour(s)).       Radiology Studies: ECHOCARDIOGRAM COMPLETE  Result Date: 07/07/2023    ECHOCARDIOGRAM REPORT   Patient Name:   Jeremy Lopez Date of Exam: 07/07/2023 Medical Rec #:  [id number]      Height:       70.0 in Accession #:     [id number]     Weight:       215.2 lb Date of Birth:  1951-01-13      BSA:          2.153 m Patient Age:    72 years       BP:           149/81 mmHg Patient Gender: M              HR:           84 bpm. Exam Location:  Inpatient Procedure: 2D Echo, Color Doppler and Cardiac Doppler Indications:    Stroke  History:        Patient has prior history of Echocardiogram examinations, most                 recent 06/14/2022. HFpEF, CAD, Arrythmias:PVC; Risk                 Factors:Dyslipidemia and Hypertension. Lung CA.  Sonographer:    Milda Smart Referring Phys: [id number] ASIA B ZIERLE-GHOSH IMPRESSIONS  1. Left ventricular ejection fraction, by estimation, is 60 to 65%. The left ventricle has normal function. The left ventricle has no regional wall motion abnormalities. There is mild left ventricular hypertrophy. Left ventricular diastolic parameters are consistent with Grade I diastolic dysfunction (impaired relaxation).  2. Right ventricular systolic function is normal. The right ventricular size is normal. Tricuspid regurgitation signal is inadequate for assessing PA pressure.  3. The mitral valve is normal in structure. No evidence of mitral valve regurgitation.  4. The aortic valve is tricuspid. Aortic valve regurgitation is not visualized. Aortic valve sclerosis is present, with no evidence of aortic valve stenosis.  5. The inferior vena cava is normal in size with greater than 50% respiratory variability, suggesting right atrial pressure of 3 mmHg. FINDINGS  Left Ventricle: Left ventricular ejection fraction, by estimation, is 60 to 65%. The left ventricle has normal function. The left ventricle has no regional wall motion abnormalities. The left ventricular internal cavity size was small. There is mild left ventricular hypertrophy. Left ventricular diastolic parameters are consistent with Grade I diastolic dysfunction (impaired relaxation). Right Ventricle: The right ventricular size is normal. No increase in  right ventricular wall thickness. Right ventricular systolic function is normal. Tricuspid regurgitation signal is inadequate for assessing PA pressure. Left Atrium: Left atrial size was normal in size. Right Atrium: Right atrial size was normal in size. Pericardium: There is no evidence of pericardial effusion. Mitral Valve: The mitral valve is normal in structure. No evidence of mitral valve regurgitation. MV peak gradient, 5.3 mmHg. The mean mitral valve gradient is 3.0 mmHg. Tricuspid Valve: The tricuspid valve is normal in structure. Tricuspid  48 42  LDLCALC 38 34  TRIG 104 109  CHOLHDL 2.2 2.3   Thyroid Function Tests: No results for input(s): "TSH", "T4TOTAL", "FREET4", "T3FREE", "THYROIDAB" in the last 72 hours. Anemia Panel: No results for input(s): "VITAMINB12", "FOLATE", "FERRITIN", "TIBC", "IRON", "RETICCTPCT" in the last 72 hours. Sepsis Labs: No results for input(s): "PROCALCITON", "LATICACIDVEN" in the last 168 hours.  No results found for this or any previous visit (from the past 240 hour(s)).       Radiology Studies: ECHOCARDIOGRAM COMPLETE  Result Date: 07/07/2023    ECHOCARDIOGRAM REPORT   Patient Name:   Jeremy Lopez Date of Exam: 07/07/2023 Medical Rec #:  [id number]      Height:       70.0 in Accession #:     [id number]     Weight:       215.2 lb Date of Birth:  1951-01-13      BSA:          2.153 m Patient Age:    72 years       BP:           149/81 mmHg Patient Gender: M              HR:           84 bpm. Exam Location:  Inpatient Procedure: 2D Echo, Color Doppler and Cardiac Doppler Indications:    Stroke  History:        Patient has prior history of Echocardiogram examinations, most                 recent 06/14/2022. HFpEF, CAD, Arrythmias:PVC; Risk                 Factors:Dyslipidemia and Hypertension. Lung CA.  Sonographer:    Milda Smart Referring Phys: [id number] ASIA B ZIERLE-GHOSH IMPRESSIONS  1. Left ventricular ejection fraction, by estimation, is 60 to 65%. The left ventricle has normal function. The left ventricle has no regional wall motion abnormalities. There is mild left ventricular hypertrophy. Left ventricular diastolic parameters are consistent with Grade I diastolic dysfunction (impaired relaxation).  2. Right ventricular systolic function is normal. The right ventricular size is normal. Tricuspid regurgitation signal is inadequate for assessing PA pressure.  3. The mitral valve is normal in structure. No evidence of mitral valve regurgitation.  4. The aortic valve is tricuspid. Aortic valve regurgitation is not visualized. Aortic valve sclerosis is present, with no evidence of aortic valve stenosis.  5. The inferior vena cava is normal in size with greater than 50% respiratory variability, suggesting right atrial pressure of 3 mmHg. FINDINGS  Left Ventricle: Left ventricular ejection fraction, by estimation, is 60 to 65%. The left ventricle has normal function. The left ventricle has no regional wall motion abnormalities. The left ventricular internal cavity size was small. There is mild left ventricular hypertrophy. Left ventricular diastolic parameters are consistent with Grade I diastolic dysfunction (impaired relaxation). Right Ventricle: The right ventricular size is normal. No increase in  right ventricular wall thickness. Right ventricular systolic function is normal. Tricuspid regurgitation signal is inadequate for assessing PA pressure. Left Atrium: Left atrial size was normal in size. Right Atrium: Right atrial size was normal in size. Pericardium: There is no evidence of pericardial effusion. Mitral Valve: The mitral valve is normal in structure. No evidence of mitral valve regurgitation. MV peak gradient, 5.3 mmHg. The mean mitral valve gradient is 3.0 mmHg. Tricuspid Valve: The tricuspid valve is normal in structure. Tricuspid  48 42  LDLCALC 38 34  TRIG 104 109  CHOLHDL 2.2 2.3   Thyroid Function Tests: No results for input(s): "TSH", "T4TOTAL", "FREET4", "T3FREE", "THYROIDAB" in the last 72 hours. Anemia Panel: No results for input(s): "VITAMINB12", "FOLATE", "FERRITIN", "TIBC", "IRON", "RETICCTPCT" in the last 72 hours. Sepsis Labs: No results for input(s): "PROCALCITON", "LATICACIDVEN" in the last 168 hours.  No results found for this or any previous visit (from the past 240 hour(s)).       Radiology Studies: ECHOCARDIOGRAM COMPLETE  Result Date: 07/07/2023    ECHOCARDIOGRAM REPORT   Patient Name:   Jeremy Lopez Date of Exam: 07/07/2023 Medical Rec #:  [id number]      Height:       70.0 in Accession #:     [id number]     Weight:       215.2 lb Date of Birth:  1951-01-13      BSA:          2.153 m Patient Age:    72 years       BP:           149/81 mmHg Patient Gender: M              HR:           84 bpm. Exam Location:  Inpatient Procedure: 2D Echo, Color Doppler and Cardiac Doppler Indications:    Stroke  History:        Patient has prior history of Echocardiogram examinations, most                 recent 06/14/2022. HFpEF, CAD, Arrythmias:PVC; Risk                 Factors:Dyslipidemia and Hypertension. Lung CA.  Sonographer:    Milda Smart Referring Phys: [id number] ASIA B ZIERLE-GHOSH IMPRESSIONS  1. Left ventricular ejection fraction, by estimation, is 60 to 65%. The left ventricle has normal function. The left ventricle has no regional wall motion abnormalities. There is mild left ventricular hypertrophy. Left ventricular diastolic parameters are consistent with Grade I diastolic dysfunction (impaired relaxation).  2. Right ventricular systolic function is normal. The right ventricular size is normal. Tricuspid regurgitation signal is inadequate for assessing PA pressure.  3. The mitral valve is normal in structure. No evidence of mitral valve regurgitation.  4. The aortic valve is tricuspid. Aortic valve regurgitation is not visualized. Aortic valve sclerosis is present, with no evidence of aortic valve stenosis.  5. The inferior vena cava is normal in size with greater than 50% respiratory variability, suggesting right atrial pressure of 3 mmHg. FINDINGS  Left Ventricle: Left ventricular ejection fraction, by estimation, is 60 to 65%. The left ventricle has normal function. The left ventricle has no regional wall motion abnormalities. The left ventricular internal cavity size was small. There is mild left ventricular hypertrophy. Left ventricular diastolic parameters are consistent with Grade I diastolic dysfunction (impaired relaxation). Right Ventricle: The right ventricular size is normal. No increase in  right ventricular wall thickness. Right ventricular systolic function is normal. Tricuspid regurgitation signal is inadequate for assessing PA pressure. Left Atrium: Left atrial size was normal in size. Right Atrium: Right atrial size was normal in size. Pericardium: There is no evidence of pericardial effusion. Mitral Valve: The mitral valve is normal in structure. No evidence of mitral valve regurgitation. MV peak gradient, 5.3 mmHg. The mean mitral valve gradient is 3.0 mmHg. Tricuspid Valve: The tricuspid valve is normal in structure. Tricuspid

## 2023-07-08 NOTE — Progress Notes (Signed)
Physical Therapy Treatment Patient Details Name: Jeremy Lopez MRN: [id number] DOB: June 03, 1951 Today's Date: 07/08/2023   History of Present Illness Pt is a 72 y.o. male presenting 9/27 with R weakness and dysequilibrium. MRI brain revealed acute/early subacute infarct of the medial inferior right cerebellum. CAD, EtOh use, HTN, HLD.    PT Comments  Well tolerated, eager, and motivated to participate with physical therapy today. Initially with posterior instability while sitting EOB and min assist to correct. Performed NMRE with lateral and A/P reaching and leaning to challenge limits of stability. Standing weight shift and lean, emphasis on midline orientation due to rightward lean. Min assist for balance with transitions from sit to stand, good power-up and set-up recall. Gait with early mod assist for LOB towards Rt and intermittent scissoring from RLE ataxia, progressed to min assist level for intermittent LOB. Multimodal cues for midline orientation and gait symmetry up to 38' x2 with walker. Patient will continue to benefit from skilled physical therapy services to further improve independence with functional mobility. Patient will benefit from intensive inpatient follow up therapy, >3 hours/day.    If plan is discharge home, recommend the following: A lot of help with walking and/or transfers;A little help with bathing/dressing/bathroom;Assistance with cooking/housework;Assist for transportation     Equipment Recommendations   (TBD next venue)    Recommendations for Other Services Rehab consult     Precautions / Restrictions Precautions Precautions: Fall Restrictions Weight Bearing Restrictions: No     Mobility  Bed Mobility               General bed mobility comments: Sitting EOB with NT when therapist walked in. Pt leaning posteriorly needed min assist to correct.    Transfers Overall transfer level: Needs assistance Equipment used: Rolling walker (2  wheels) Transfers: Sit to/from Stand Sit to Stand: Min assist           General transfer comment: Good recall of set up with RW, foot placement, and power up. Min assist lightly for balance upon rising leaning towards Rt.    Ambulation/Gait Ambulation/Gait assistance: Mod assist ((progressed to min)) Gait Distance (Feet): 75 Feet (x2) Assistive device: Rolling walker (2 wheels) Gait Pattern/deviations: Step-through pattern, Decreased step length - right, Decreased stance time - right, Decreased stride length, Decreased dorsiflexion - right, Decreased weight shift to left, Ataxic, Scissoring, Staggering right, Drifts right/left, Narrow base of support, Knees buckling Gait velocity: decr Gait velocity interpretation: <1.31 ft/sec, indicative of household ambulator   General Gait Details: Initially mod assist for balance in room with LOB towards right and scissoring. With assist for alignment, cues for visual and tactile feedback within RW, pt progressed to min assist level. RLE ataxic with sporadic adduction and early toe strike. Min assist for weight shift and midline reorientation. Cues for walker proximity with turns and to make larger steps to avoid stumbling.   Stairs             Wheelchair Mobility     Tilt Bed    Modified Rankin (Stroke Patients Only) Modified Rankin (Stroke Patients Only) Pre-Morbid Rankin Score: No symptoms Modified Rankin: Moderately severe disability     Balance Overall balance assessment: Needs assistance Sitting-balance support: No upper extremity supported, Feet supported Sitting balance-Leahy Scale: Fair Sitting balance - Comments: Initially min assist with posterior lean. Progressed to CGA at EOB after training. Postural control: Posterior lean Standing balance support: Single extremity supported Standing balance-Leahy Scale: Poor Standing balance comment: reliant on RW; leans Rt, able  to correct with VC.                             Cognition Arousal: Alert Behavior During Therapy: WFL for tasks assessed/performed Overall Cognitive Status: No family/caregiver present to determine baseline cognitive functioning                                          Exercises Other Exercises Other Exercises: Seated balance training, leaning onto Rt and Lt elbows, reaching lateral limits of stability. Anterior an posterior lean with forward reach and midline recovery. Other Exercises: Standing balance, lateral leans and forward weight shift > to heel/toe rock with min assist for balance statically.    General Comments General comments (skin integrity, edema, etc.): Wife present, supportive.      Pertinent Vitals/Pain Pain Assessment Pain Assessment: No/denies pain    Home Living   Living Arrangements: Spouse/significant other Available Help at Discharge: Family Type of Home: House Home Access: Level entry       Home Layout: One level        Prior Function            PT Goals (current goals can now be found in the care plan section) Acute Rehab PT Goals Patient Stated Goal: Get well PT Goal Formulation: With patient/family Time For Goal Achievement: 07/19/23 Potential to Achieve Goals: Good Progress towards PT goals: Progressing toward goals    Frequency    Min 1X/week      PT Plan      Co-evaluation              AM-PAC PT "6 Clicks" Mobility   Outcome Measure  Help needed turning from your back to your side while in a flat bed without using bedrails?: A Little Help needed moving from lying on your back to sitting on the side of a flat bed without using bedrails?: A Little Help needed moving to and from a bed to a chair (including a wheelchair)?: A Little Help needed standing up from a chair using your arms (e.g., wheelchair or bedside chair)?: A Little Help needed to walk in hospital room?: A Lot Help needed climbing 3-5 steps with a railing? : A Lot 6 Click Score:  16    End of Session Equipment Utilized During Treatment: Gait belt Activity Tolerance: Patient tolerated treatment well Patient left: in chair;with call bell/phone within reach;with chair alarm set;with family/visitor present Nurse Communication: Mobility status PT Visit Diagnosis: Unsteadiness on feet (R26.81);Other abnormalities of gait and mobility (R26.89);Other symptoms and signs involving the nervous system (R29.898);Hemiplegia and hemiparesis Hemiplegia - Right/Left: Right Hemiplegia - caused by: Cerebral infarction     Time: 1610-9604 PT Time Calculation (min) (ACUTE ONLY): 25 min  Charges:    $Gait Training: 8-22 mins $Neuromuscular Re-education: 8-22 mins PT General Charges $$ ACUTE PT VISIT: 1 Visit                     [name], PT, DPT [facility]  Rehabilitation Services Physical Therapist Office: [phone] Website: [email or web address]    [name] 07/08/2023, 1:52 PM

## 2023-07-08 NOTE — Consult Note (Signed)
Physical Medicine and Rehabilitation Consult Reason for Consult:functional deficits after stroke Referring Physician: [place]   HPI: Jeremy Lopez is a 72 y.o. male with a history of hypertension, CAD, depression, lung cancer status post chemotherapy and radiation who presented to [facility] on 927 with right-sided weakness and loss of balance as well as headaches.  Initial CT did not show acute changes but MRI of the brain demonstrated acute/subacute medial inferior right cerebellum infarct.  Patient was placed on aspirin and Plavix.  Ventriculomegaly on imaging is chronic and was being followed by neurology as an outpatient with the question of normal pressure hydrocephalus.  Plan is for LP after 3 weeks of DAPT.  Patient was up with therapy over the weekend and was min assist for sit to stand transfers and min assist for 65 feet with ataxic, scissoring, staggering gait often drifting to the right.  Patient lives in a 1 level home with his wife with level entry.  The patient was modified independent and driving prior to his admission.   Review of Systems  Constitutional:  Negative for chills and fever.  HENT: Negative.    Eyes: Negative.   Respiratory: Negative.    Cardiovascular: Negative.   Gastrointestinal:  Positive for nausea.  Genitourinary:  Negative for dysuria.  Musculoskeletal: Negative.   Skin: Negative.   Neurological:  Positive for dizziness, tremors and weakness.   Past Medical History:  Diagnosis Date   CAD (coronary artery disease), native coronary artery    DES proximal LAD 07/2009; DES to RCA 04/2020   Chronic heart failure with preserved ejection fraction (HFpEF) (HCC)    Essential hypertension    Habitual alcohol use    Hyperlipidemia    PVC's (premature ventricular contractions)    Small cell lung cancer (HCC)    Right upper lobe 2008; no evidence of disease since treatment ended in 11/2007   Past Surgical History:  Procedure Laterality Date    CORONARY ATHERECTOMY N/A 04/26/2020   Procedure: CORONARY ATHERECTOMY;  Surgeon: Corky Crafts, MD;  Location: [facility] INVASIVE CV LAB;  Service: Cardiovascular;  Laterality: N/A;   CORONARY STENT INTERVENTION N/A 04/26/2020   Procedure: CORONARY STENT INTERVENTION;  Surgeon: Corky Crafts, MD;  Location: [facility] INVASIVE CV LAB;  Service: Cardiovascular;  Laterality: N/A;   CORONARY ULTRASOUND/IVUS N/A 04/26/2020   Procedure: Intravascular Ultrasound/IVUS;  Surgeon: Corky Crafts, MD;  Location: [facility] INVASIVE CV LAB;  Service: Cardiovascular;  Laterality: N/A;   Insertion of left subclavian Port-A-Cath  2008   LEFT HEART CATH AND CORONARY ANGIOGRAPHY N/A 07/03/2022   Procedure: LEFT HEART CATH AND CORONARY ANGIOGRAPHY;  Surgeon: [place], Peter M, MD;  Location: [facility] INVASIVE CV LAB;  Service: Cardiovascular;  Laterality: N/A;   RIGHT/LEFT HEART CATH AND CORONARY ANGIOGRAPHY N/A 04/26/2020   Procedure: RIGHT/LEFT HEART CATH AND CORONARY ANGIOGRAPHY;  Surgeon: Corky Crafts, MD;  Location: [facility] INVASIVE CV LAB;  Service: Cardiovascular;  Laterality: N/A;   TEMPORARY PACEMAKER N/A 04/26/2020   Procedure: TEMPORARY PACEMAKER;  Surgeon: Corky Crafts, MD;  Location: [facility] INVASIVE CV LAB;  Service: Cardiovascular;  Laterality: N/A;   Video bronchoscopy and video mediastinoscopy  2008   Family History  Problem Relation Age of Onset   Cancer Father    CAD Brother    Hypertension Brother    Depression Maternal Uncle    Alcohol abuse Maternal Uncle    Social History:  reports that he quit smoking about 24 years ago. His  smoking use included cigarettes. He started smoking about 56 years ago. He has never used smokeless tobacco. He reports current alcohol use of about 14.0 standard drinks of alcohol per week. He reports current drug use. Drug: Marijuana. Allergies:  Allergies  Allergen Reactions   Cephalexin Anaphylaxis    throat swelling Patient said he can take amoxicillin    Amlodipine Swelling    Leg swelling   Medications Prior to Admission  Medication Sig Dispense Refill   acetaminophen (TYLENOL) 500 MG tablet Take 500 mg by mouth every 6 (six) hours as needed for moderate pain.     albuterol (VENTOLIN HFA) 108 (90 Base) MCG/ACT inhaler Inhale 2 puffs into the lungs every 6 (six) hours as needed for wheezing or shortness of breath.     Cholecalciferol (VITAMIN D) 50 MCG (2000 UT) tablet Take 2,000 Units by mouth daily.     clopidogrel (PLAVIX) 75 MG tablet Take 1 tablet (75 mg total) by mouth daily with breakfast. 90 tablet 3   diltiazem (CARDIZEM CD) 240 MG 24 hr capsule Take 240 mg by mouth daily.     empagliflozin (JARDIANCE) 10 MG TABS tablet Take 1 tablet (10 mg total) by mouth daily before breakfast. 30 tablet 11   escitalopram (LEXAPRO) 10 MG tablet Take 1 tablet (10 mg total) by mouth daily. 30 tablet 1   Fluticasone-Umeclidin-Vilant (TRELEGY ELLIPTA) 100-62.5-25 MCG/ACT AEPB Inhale 1 puff into the lungs daily. 180 each 3   gabapentin (NEURONTIN) 300 MG capsule Take 1 capsule (300 mg total) by mouth at bedtime. 90 capsule 3   metoprolol succinate (TOPROL-XL) 100 MG 24 hr tablet TAKE ONE TABLET (100MG  TOTAL) BY MOUTH DAILY. TAKE WITH OR IMMEDIATELY FOLLOWING A MEAL. 90 tablet 2   nitroGLYCERIN (NITROSTAT) 0.4 MG SL tablet Place 1 tablet (0.4 mg total) under the tongue every 5 (five) minutes as needed for chest pain. 25 tablet 2   prochlorperazine (COMPAZINE) 5 MG tablet Take 1 tablet (5 mg total) by mouth every 6 (six) hours as needed for nausea or vomiting. 30 tablet 0   rosuvastatin (CRESTOR) 40 MG tablet TAKE ONE (1) TABLET BY MOUTH EVERY DAY 90 tablet 3   traZODone (DESYREL) 50 MG tablet Take 1 tablet (50 mg total) by mouth at bedtime. 90 tablet 1    Home: Home Living Family/patient expects to be discharged to:: Private residence Living Arrangements: Spouse/significant other Available Help at Discharge: Family Type of Home: House Home Access:  Level entry Home Layout: One level Bathroom Shower/Tub: Health visitor: Handicapped height Home Equipment: Banker History: Prior Function Prior Level of Function : Independent/Modified Independent, Driving Functional Status:  Mobility: Bed Mobility Overal bed mobility: Needs Assistance Bed Mobility: Supine to Sit Supine to sit: Contact guard, Used rails, HOB elevated General bed mobility comments: for safety, slower, efforful, use of rail. Transfers Overall transfer level: Needs assistance Equipment used: Rolling walker (2 wheels) Transfers: Sit to/from Stand Sit to Stand: Min assist, +2 safety/equipment General transfer comment: Min assist for balance, good power-up, cues for hand placement and awareness of right lean. Good coordinated control with descent into recliner but reports feeling off center. Ambulation/Gait Ambulation/Gait assistance: Min assist, +2 physical assistance, +2 safety/equipment Gait Distance (Feet): 65 Feet Assistive device: Rolling walker (2 wheels) Gait Pattern/deviations: Step-through pattern, Decreased step length - right, Decreased stance time - right, Decreased stride length, Decreased dorsiflexion - right, Decreased weight shift to left, Shuffle, Ataxic, Scissoring, Staggering right, Drifts right/left, Narrow base of support  General Gait Details: Min assist +2 at all times due to right lean and ataxic gait with RLE intermittently adduction into midline and stumbling. With extensive cues and education, pt able to slow gait, widen BOS, and demonstrate fair improvement in step symmetry about 50% of the time, but still needs assist to correct Rt sided lean. Performed with use of RW. Gait velocity: decr Gait velocity interpretation: <1.31 ft/sec, indicative of household ambulator Pre-gait activities: weight shift lateral, a/p and progressed to marching in place with bil > SL UE support (drifting Rt.)    ADL: ADL Overall ADL's  : Needs assistance/impaired Eating/Feeding: Modified independent, Bed level Grooming: Set up, Sitting Upper Body Bathing: Set up, Sitting Lower Body Bathing: Moderate assistance, Sit to/from stand Upper Body Dressing : Set up, Sitting Lower Body Dressing: Sit to/from stand, Maximal assistance Toilet Transfer: Moderate assistance, Ambulation, Rolling walker (2 wheels) Functional mobility during ADLs: Moderate assistance, Rolling walker (2 wheels)  Cognition: Cognition Overall Cognitive Status: Within Functional Limits for tasks assessed Orientation Level: Oriented X4 Cognition Arousal: Alert Behavior During Therapy: WFL for tasks assessed/performed Overall Cognitive Status: Within Functional Limits for tasks assessed General Comments: WFL for basic tasks and money management  Blood pressure 126/75, pulse 94, temperature 98.2 F (36.8 C), temperature source Oral, resp. rate 17, height 5\' 10"  (1.778 m), weight 97.6 kg, SpO2 94%. Physical Exam Constitutional:      Appearance: He is obese.  HENT:     Head: Normocephalic.     Right Ear: External ear normal.     Left Ear: External ear normal.     Nose: Nose normal.     Mouth/Throat:     Mouth: Mucous membranes are moist.  Eyes:     Conjunctiva/sclera: Conjunctivae normal.     Comments: Mild right lid ptosis  Cardiovascular:     Rate and Rhythm: Normal rate.  Pulmonary:     Effort: Pulmonary effort is normal.  Abdominal:     Palpations: Abdomen is soft.  Musculoskeletal:        General: No swelling. Normal range of motion.     Cervical back: Normal range of motion.  Skin:    General: Skin is warm.  Neurological:     Mental Status: He is alert.     Comments: Alert and oriented x 3. Normal insight and awareness. Intact Memory. Normal language and speech. Cranial nerve exam unremarkable. Mild right lid ptosis. MMT: 4+/5 RUE 5/5 LUE, BLE 4/5 prox to 4+/5 distally. No focal sensory findings. Mild intentional tremor. No focal limb  ataxia, no nystagmus. No abnormal muscle tone. DTR's 1+.    Psychiatric:        Mood and Affect: Mood normal.        Behavior: Behavior normal.     No results found for this or any previous visit (from the past 24 hour(s)). ECHOCARDIOGRAM COMPLETE  Result Date: 07/07/2023    ECHOCARDIOGRAM REPORT   Patient Name:   AKING SHULMAN Date of Exam: 07/07/2023 Medical Rec #:  [id number]      Height:       70.0 in Accession #:    [id number]     Weight:       215.2 lb Date of Birth:  1951-06-12      BSA:          2.153 m Patient Age:    72 years       BP:           149/81  mmHg Patient Gender: M              HR:           84 bpm. Exam Location:  Inpatient Procedure: 2D Echo, Color Doppler and Cardiac Doppler Indications:    Stroke  History:        Patient has prior history of Echocardiogram examinations, most                 recent 06/14/2022. HFpEF, CAD, Arrythmias:PVC; Risk                 Factors:Dyslipidemia and Hypertension. Lung CA.  Sonographer:    Milda Smart Referring Phys: [id number] ASIA B ZIERLE-GHOSH IMPRESSIONS  1. Left ventricular ejection fraction, by estimation, is 60 to 65%. The left ventricle has normal function. The left ventricle has no regional wall motion abnormalities. There is mild left ventricular hypertrophy. Left ventricular diastolic parameters are consistent with Grade I diastolic dysfunction (impaired relaxation).  2. Right ventricular systolic function is normal. The right ventricular size is normal. Tricuspid regurgitation signal is inadequate for assessing PA pressure.  3. The mitral valve is normal in structure. No evidence of mitral valve regurgitation.  4. The aortic valve is tricuspid. Aortic valve regurgitation is not visualized. Aortic valve sclerosis is present, with no evidence of aortic valve stenosis.  5. The inferior vena cava is normal in size with greater than 50% respiratory variability, suggesting right atrial pressure of 3 mmHg. FINDINGS  Left Ventricle: Left  ventricular ejection fraction, by estimation, is 60 to 65%. The left ventricle has normal function. The left ventricle has no regional wall motion abnormalities. The left ventricular internal cavity size was small. There is mild left ventricular hypertrophy. Left ventricular diastolic parameters are consistent with Grade I diastolic dysfunction (impaired relaxation). Right Ventricle: The right ventricular size is normal. No increase in right ventricular wall thickness. Right ventricular systolic function is normal. Tricuspid regurgitation signal is inadequate for assessing PA pressure. Left Atrium: Left atrial size was normal in size. Right Atrium: Right atrial size was normal in size. Pericardium: There is no evidence of pericardial effusion. Mitral Valve: The mitral valve is normal in structure. No evidence of mitral valve regurgitation. MV peak gradient, 5.3 mmHg. The mean mitral valve gradient is 3.0 mmHg. Tricuspid Valve: The tricuspid valve is normal in structure. Tricuspid valve regurgitation is trivial. Aortic Valve: The aortic valve is tricuspid. Aortic valve regurgitation is not visualized. Aortic valve sclerosis is present, with no evidence of aortic valve stenosis. Pulmonic Valve: The pulmonic valve was not well visualized. Pulmonic valve regurgitation is not visualized. Aorta: The aortic root and ascending aorta are structurally normal, with no evidence of dilitation. Venous: The inferior vena cava is normal in size with greater than 50% respiratory variability, suggesting right atrial pressure of 3 mmHg. IAS/Shunts: The interatrial septum was not well visualized.  LEFT VENTRICLE PLAX 2D LVIDd:         3.30 cm     Diastology LVIDs:         2.20 cm     LV e' medial:    4.03 cm/s LV PW:         1.20 cm     LV E/e' medial:  13.9 LV IVS:        1.45 cm     LV e' lateral:   7.07 cm/s LVOT diam:     2.20 cm     LV E/e' lateral: 7.9 LV SV:  72 LV SV Index:   34 LVOT Area:     3.80 cm  LV Volumes (MOD)  LV vol d, MOD A2C: 66.7 ml LV vol d, MOD A4C: 69.5 ml LV vol s, MOD A2C: 28.5 ml LV vol s, MOD A4C: 24.4 ml LV SV MOD A2C:     38.2 ml LV SV MOD A4C:     69.5 ml LV SV MOD BP:      42.4 ml RIGHT VENTRICLE             IVC RV Basal diam:  2.60 cm     IVC diam: 1.00 cm RV S prime:     14.00 cm/s TAPSE (M-mode): 1.6 cm LEFT ATRIUM             Index        RIGHT ATRIUM           Index LA diam:        3.70 cm 1.72 cm/m   RA Area:     11.60 cm LA Vol (A2C):   33.6 ml 15.60 ml/m  RA Volume:   24.60 ml  11.42 ml/m LA Vol (A4C):   29.7 ml 13.79 ml/m LA Biplane Vol: 32.2 ml 14.95 ml/m  AORTIC VALVE LVOT Vmax:   99.40 cm/s LVOT Vmean:  74.500 cm/s LVOT VTI:    0.190 m  AORTA Ao Root diam: 3.40 cm Ao Asc diam:  3.70 cm MITRAL VALVE MV Area (PHT): 2.32 cm     SHUNTS MV Area VTI:   3.42 cm     Systemic VTI:  0.19 m MV Peak grad:  5.3 mmHg     Systemic Diam: 2.20 cm MV Mean grad:  3.0 mmHg MV Vmax:       1.15 m/s MV Vmean:      76.3 cm/s MV Decel Time: 327 msec MV E velocity: 56.20 cm/s MV A velocity: 103.00 cm/s MV E/A ratio:  0.55 Epifanio Lesches MD Electronically signed by Epifanio Lesches MD Signature Date/Time: 07/07/2023/7:55:02 PM    Final     Assessment/Plan: Diagnosis: 72 year old male status post right cerebellar infarct with ongoing right sided weakness and ataxia. Does the need for close, 24 hr/day medical supervision in concert with the patient's rehab needs make it unreasonable for this patient to be served in a less intensive setting? Yes Co-Morbidities requiring supervision/potential complications:  -? normal pressure hydrocephalus -Lung cancer status post chemotherapy and radiation -Chronic asthmatic bronchitis -CAD status post PCI/DES -Obesity Due to bladder management, bowel management, safety, skin/wound care, disease management, medication administration, pain management, and patient education, does the patient require 24 hr/day rehab nursing? Yes Does the patient require coordinated  care of a physician, rehab nurse, therapy disciplines of PT, OT to address physical and functional deficits in the context of the above medical diagnosis(es)? Yes Addressing deficits in the following areas: balance, endurance, locomotion, strength, transferring, bowel/bladder control, bathing, dressing, feeding, grooming, toileting, and psychosocial support Can the patient actively participate in an intensive therapy program of at least 3 hrs of therapy per day at least 5 days per week? Yes The potential for patient to make measurable gains while on inpatient rehab is excellent Anticipated functional outcomes upon discharge from inpatient rehab are modified independent and supervision  with PT, modified independent and supervision with OT, n/a with SLP. Estimated rehab length of stay to reach the above functional goals is: 10-12 days Anticipated discharge destination: Home Overall Rehab/Functional Prognosis: excellent  POST ACUTE RECOMMENDATIONS: This patient's  condition is appropriate for continued rehabilitative care in the following setting: CIR Patient has agreed to participate in recommended program. Yes Note that insurance prior authorization may be required for reimbursement for recommended care.  Comment: Pt motivated to return home with wife. LP once home to assess for NPH    I have personally performed a face to face diagnostic evaluation of this patient. Additionally, I have examined the patient's medical record including any pertinent labs and radiographic images. If the physician assistant has documented in this note, I have reviewed and edited or otherwise concur with the physician assistant's documentation.  Thanks,  Ranelle Oyster, MD 07/08/2023

## 2023-07-08 NOTE — PMR Pre-admission (Signed)
PMR Admission Coordinator Pre-Admission Assessment  Patient: Jeremy Lopez is an 72 y.o., male MRN: [id number] DOB: 03-Apr-1951 Height: 5\' 10"  (177.8 cm) Weight: 97.6 kg              Insurance Information HMO: yes    PPO:      PCP:      IPA:      80/20:      OTHER:  PRIMARY: [employer]      Policy#: [id number]      Subscriber: patient CM Name: Tammy      Phone#: [phone]     Fax#: [phone] Pre-Cert#: [id number] approved for 7 days on 07/11/23      Employer:  Benefits:  Phone: [phone]-option 1, spoke with Nat Christen Date: 10/08/2022 - 10/08/2023 Deductible: no deductible ($0) OOP Max: $2,900 ($185 met) CIR: $200/day co-pay for days 1-5, $0/day days 6-90 SNF:  $0/day co-pay for days 1-20, $203/day co-pay for days 21-100; limited to 100 days/benefit period Outpatient: $0/visit co-pay Home Health:  100% coverage DME: 80% coverage; 20% co-insurance Providers: in [employer]   SECONDARY:       Policy#:       Phone#:   Artist:       Phone#:   The Data processing manager" for patients in Inpatient Rehabilitation Facilities with attached "Privacy Act Statement-Health Care Records" was provided and verbally reviewed with: Patient and Family  Emergency Contact Information Contact Information     Name Relation Home Work Mobile   [place] Spouse [phone]  [phone]   Shazil, Hilaire   [phone]      Other Contacts     Name Relation Home Work Mobile   [place] Daughter   [phone]      Current Medical History  Patient Admitting Diagnosis: R cerebellar CVA  History of Present Illness: ELICEO Lopez is a 72 y.o. male with a history of hypertension, CAD, depression, lung cancer status post chemotherapy and radiation who presented to [facility] on 9/27 with right-sided weakness and loss of balance as well as headaches.  Initial CT did not show acute changes but MRI of the brain demonstrated acute/subacute medial  inferior right cerebellum infarct.  Patient was transferred to [facility] for further work up on 9/27. Patient was placed on aspirin and Plavix.  Ventriculomegaly on imaging is chronic and was being followed by neurology as an outpatient with the question of normal pressure hydrocephalus.  Plan is for LP after 3 weeks of DAPT.  Patient was up with therapy over the weekend and was min assist for sit to stand transfers and min assist for 65 feet with ataxic, scissoring, staggering gait often drifting to the right.  Patient lives in a 1 level home with his wife with level entry.  The patient was modified independent and driving prior to his admission.   Complete NIHSS TOTAL: 1 Glasgow Coma Scale Score: 15  Patient's medical record from [facility] and [facility] has been reviewed by the rehabilitation admission coordinator and physician.  Past Medical History  Past Medical History:  Diagnosis Date   CAD (coronary artery disease), native coronary artery    DES proximal LAD 07/2009; DES to RCA 04/2020   Chronic heart failure with preserved ejection fraction (HFpEF) (HCC)    Essential hypertension    Habitual alcohol use    Hyperlipidemia    PVC's (premature ventricular contractions)    Small cell lung cancer (HCC)    Right upper lobe 2008; no  evidence of disease since treatment ended in 11/2007    Has the patient had major surgery during 100 days prior to admission? No  Family History  family history includes Alcohol abuse in his maternal uncle; CAD in his brother; Cancer in his father; Depression in his maternal uncle; Hypertension in his brother.   Current Medications   Current Facility-Administered Medications:    acetaminophen (TYLENOL) tablet 650 mg, 650 mg, Oral, Q6H PRN, Zierle-Ghosh, Asia B, DO, 650 mg at 07/07/23 0940   aspirin EC tablet 81 mg, 81 mg, Oral, Daily, Zierle-Ghosh, Asia B, DO, 81 mg at 07/12/23 4782   bisacodyl (DULCOLAX) suppository 10 mg, 10 mg, Rectal, Daily  PRN, Zigmund Daniel., MD, 10 mg at 07/10/23 0800   clopidogrel (PLAVIX) tablet 75 mg, 75 mg, Oral, Daily, Zierle-Ghosh, Asia B, DO, 75 mg at 07/12/23 9562   diltiazem (CARDIZEM CD) 24 hr capsule 240 mg, 240 mg, Oral, Daily, [place], Alvira Philips, DO, 240 mg at 07/12/23 1308   enoxaparin (LOVENOX) injection 40 mg, 40 mg, Subcutaneous, Q24H, Zierle-Ghosh, Asia B, DO, 40 mg at 07/12/23 0824   escitalopram (LEXAPRO) tablet 10 mg, 10 mg, Oral, Daily, Zierle-Ghosh, Asia B, DO, 10 mg at 07/12/23 0822   fluticasone furoate-vilanterol (BREO ELLIPTA) 100-25 MCG/ACT 1 puff, 1 puff, Inhalation, Daily, 1 puff at 07/12/23 0801 **AND** umeclidinium bromide (INCRUSE ELLIPTA) 62.5 MCG/ACT 1 puff, 1 puff, Inhalation, Daily, Zierle-Ghosh, Asia B, DO, 1 puff at 07/12/23 0801   gabapentin (NEURONTIN) capsule 300 mg, 300 mg, Oral, QHS, Zierle-Ghosh, Asia B, DO, 300 mg at 07/11/23 2023   HYDROcodone bit-homatropine (HYCODAN) 5-1.5 MG/5ML syrup 5 mL, 5 mL, Oral, Q4H PRN, [place], Alvira Philips, DO, 5 mL at 07/12/23 6578   loratadine (CLARITIN) tablet 10 mg, 10 mg, Oral, Daily, [place], Alvira Philips, DO, 10 mg at 07/12/23 4696   metoprolol succinate (TOPROL-XL) 24 hr tablet 100 mg, 100 mg, Oral, Daily, [place], Alvira Philips, DO, 100 mg at 07/12/23 2952   Oral care mouth rinse, 15 mL, Mouth Rinse, PRN, Zierle-Ghosh, Asia B, DO   rosuvastatin (CRESTOR) tablet 40 mg, 40 mg, Oral, Daily, Zierle-Ghosh, Asia B, DO, 40 mg at 07/12/23 8413   senna-docusate (Senokot-S) tablet 1 tablet, 1 tablet, Oral, QHS PRN, Zierle-Ghosh, Asia B, DO, 1 tablet at 07/09/23 1023   traZODone (DESYREL) tablet 50 mg, 50 mg, Oral, QHS, Zierle-Ghosh, Asia B, DO, 50 mg at 07/11/23 2023  Patients Current Diet:  Diet Order             Diet Heart Room service appropriate? Yes; Fluid consistency: Thin  Diet effective now                   Precautions / Restrictions Precautions Precautions: Fall Restrictions Weight Bearing Restrictions: No   Has the patient had  2 or more falls or a fall with injury in the past year?No  Prior Activity Level    Prior Functional Level Prior Function Prior Level of Function : Independent/Modified Independent, Driving  Self Care: Did the patient need help bathing, dressing, using the toilet or eating?  Independent  Indoor Mobility: Did the patient need assistance with walking from room to room (with or without device)? Independent  Stairs: Did the patient need assistance with internal or external stairs (with or without device)? Independent  Functional Cognition: Did the patient need help planning regular tasks such as shopping or remembering to take medications? Independent  Patient Information Are you of Hispanic, Latino/a,or Spanish origin?: A.  No, not of Hispanic, Latino/a, or Spanish origin What is your race?: A. White Do you need or want an interpreter to communicate with a doctor or health care staff?: 0. No  Patient's Response To:  Health Literacy and Transportation Is the patient able to respond to health literacy and transportation needs?: Yes Health Literacy - How often do you need to have someone help you when you read instructions, pamphlets, or other written material from your doctor or pharmacy?: Never In the past 12 months, has lack of transportation kept you from medical appointments or from getting medications?: No In the past 12 months, has lack of transportation kept you from meetings, work, or from getting things needed for daily living?: No  Home Assistive Devices / Equipment Home Equipment: Shower seat  Prior Device Use: Indicate devices/aids used by the patient prior to current illness, exacerbation or injury? None of the above  Current Functional Level Cognition  Arousal/Alertness: Awake/alert Overall Cognitive Status: Impaired/Different from baseline Orientation Level: Oriented X4 General Comments: following one step commands consistantly Attention: Sustained Sustained  Attention: Appears intact Memory: Impaired (2/5 words) Memory Impairment: Retrieval deficit Awareness: Appears intact Problem Solving: Appears intact Safety/Judgment: Appears intact    Extremity Assessment (includes Sensation/Coordination)  Upper Extremity Assessment: Generalized weakness RUE Deficits / Details: generally weaker than L, also pt's non-dom hand so could be baseline for him. Coordination and dexterity were WFL  Lower Extremity Assessment: Defer to PT evaluation    ADLs  Overall ADL's : Needs assistance/impaired Eating/Feeding: Modified independent, Bed level Grooming: Wash/dry hands, Wash/dry face, Oral care, Applying deodorant, Brushing hair, Contact guard assist, Standing Grooming Details (indicate cue type and reason): right lateral leaning while standing at sink with cues to correct Upper Body Bathing: Contact guard assist, Standing Lower Body Bathing: Moderate assistance, Sit to/from stand Upper Body Dressing : Set up, Sitting Lower Body Dressing: Sit to/from stand, Maximal assistance Toilet Transfer: Minimal assistance, Moderate assistance, Rolling walker (2 wheels), Ambulation Toilet Transfer Details (indicate cue type and reason): assistance due to right lateral leaning Functional mobility during ADLs: Moderate assistance, Rolling walker (2 wheels) General ADL Comments: session focused on cognition    Mobility  Overal bed mobility: Needs Assistance Bed Mobility: Supine to Sit Supine to sit: Supervision Sit to supine: Supervision General bed mobility comments: min right lateral lean initially but corrected with cues    Transfers  Overall transfer level: Needs assistance Equipment used: Rolling walker (2 wheels) Transfers: Sit to/from Stand Sit to Stand: Contact guard assist General transfer comment: min assist with cues for hand placement for transfers    Ambulation / Gait / Stairs / Wheelchair Mobility  Ambulation/Gait Ambulation/Gait assistance: Mod  assist Gait Distance (Feet): 65 Feet Assistive device: Rolling walker (2 wheels) Gait Pattern/deviations: Step-through pattern, Decreased step length - right, Decreased stance time - right, Decreased stride length, Decreased dorsiflexion - right, Decreased weight shift to left, Ataxic, Scissoring, Staggering right, Drifts right/left, Narrow base of support, Knees buckling General Gait Details: Intermittent mod assist for balance due to LOB towards right. Frequent tactile, verbal and visual cues to orient to midline and adjust Rt lean. Cues for gait symmetry with larger Rt step length however ataxic. Cues for RW placement closer to BOS especially with turns, where LOB is most pronounced. Gait velocity: decr Gait velocity interpretation: <1.31 ft/sec, indicative of household ambulator Pre-gait activities: march in place BIL UE support min assist    Posture / Balance Dynamic Sitting Balance Sitting balance - Comments: Minimal Rt  lean. Balance Overall balance assessment: Needs assistance Sitting-balance support: No upper extremity supported, Feet supported Sitting balance-Leahy Scale: Fair Sitting balance - Comments: Minimal Rt lean. Postural control: Right lateral lean Standing balance support: Single extremity supported, Bilateral upper extremity supported, During functional activity Standing balance-Leahy Scale: Poor Standing balance comment: stood at sink for self care task with right lateral leaning and verbal and tactile cues to correct.    Special needs/care consideration Skin intact     Previous Home Environment (from acute therapy documentation) Living Arrangements: Spouse/significant other  Lives With: Spouse Available Help at Discharge: Family Type of Home: House Home Layout: One level Home Access: Level entry Bathroom Shower/Tub: Health visitor: Handicapped height Bathroom Accessibility: Yes How Accessible: Accessible via walker Home Care Services:  No  Discharge Living Setting Plans for Discharge Living Setting: Patient's home, Lives with (comment) (spouse) Type of Home at Discharge: House Discharge Home Layout: One level Discharge Home Access: Level entry Discharge Bathroom Shower/Tub: Walk-in shower Discharge Bathroom Toilet: Handicapped height Discharge Bathroom Accessibility: Yes How Accessible: Accessible via walker Does the patient have any problems obtaining your medications?: No  Social/Family/Support Systems Anticipated Caregiver: Spouse, [name] Anticipated Caregiver's Contact Information: [phone] Caregiver Availability: 24/7 Discharge Plan Discussed with Primary Caregiver: Yes Is Caregiver In Agreement with Plan?: Yes Does Caregiver/Family have Issues with Lodging/Transportation while Pt is in Rehab?: No   Goals Patient/Family Goal for Rehab: mod I PT, OT Expected length of stay: 10-12 days Pt/Family Agrees to Admission and willing to participate: Yes Program Orientation Provided & Reviewed with Pt/Caregiver Including Roles  & Responsibilities: Yes  Barriers to Discharge: Insurance for SNF coverage   Decrease burden of Care through IP rehab admission: Othern/a   Possible need for SNF placement upon discharge:not anticipated   Patient Condition: This patient's medical and functional status has changed since the consult dated 07/08/2023 in which the Rehabilitation Physician determined and documented that the patient was potentially appropriate for intensive rehabilitative care in an inpatient rehabilitation facility. Issues have been addressed and update has been discussed with Dr. Riley Kill and patient now appropriate for inpatient rehabilitation. Will admit to inpatient rehab tomorrow, Saturday, 07/13/23.   Preadmission Screen Completed By:  Trish Mage, RN, 07/12/2023 11:36 AM ______________________________________________________________________   Discussed status with Dr. Riley Kill on10/04/24 at 1000 am  and  received approval for admission tomorrow, Saturday, 07/13/23.  Admission Coordinator:  Trish Mage, time1137/Date10/4/24

## 2023-07-08 NOTE — Discharge Instructions (Signed)

## 2023-07-08 NOTE — TOC Initial Note (Signed)
Transition of Care [facility]) - Initial/Assessment Note    Patient Details  Name: Jeremy Lopez MRN: [id number] Date of Birth: December 18, 1950  [facility] CM/SW Contact:    Kermit Balo, RN Phone Number: 07/08/2023, 10:46 AM  Clinical Narrative:                  Patient is from home with his spouse. They are together all the time. Wife is able to provide assistance at home.  Pt manages his own medications and denies any issues.  Pt drives but spouse can provide needed transportation.  Awaiting CIR evals.  TOC following.  Expected Discharge Plan: IP Rehab Facility Barriers to Discharge: Continued Medical Work up   Patient Goals and CMS Choice   CMS Medicare.gov Compare Post Acute Care list provided to:: Patient Choice offered to / list presented to : Patient      Expected Discharge Plan and Services   Discharge Planning Services: CM Consult Post Acute Care Choice: IP Rehab Living arrangements for the past 2 months: Single Family Home                                      Prior Living Arrangements/Services Living arrangements for the past 2 months: Single Family Home Lives with:: Spouse Patient language and need for interpreter reviewed:: Yes Do you feel safe going back to the place where you live?: Yes      Need for Family Participation in Patient Care: Yes (Comment) Care giver support system in place?: Yes (comment) Current home services: DME (states his neighbor has all DME and he can borrow what he needs) Criminal Activity/Legal Involvement Pertinent to Current Situation/Hospitalization: No - Comment as needed  Activities of Daily Living   ADL Screening (condition at time of admission) Does the patient have a NEW difficulty with bathing/dressing/toileting/self-feeding that is expected to last >3 days?: No Does the patient have a NEW difficulty with getting in/out of bed, walking, or climbing stairs that is expected to last >3 days?: Yes (Initiates  electronic notice to provider for possible PT consult) Does the patient have a NEW difficulty with communication that is expected to last >3 days?: No Is the patient deaf or have difficulty hearing?: No Does the patient have difficulty seeing, even when wearing glasses/contacts?: No Does the patient have difficulty concentrating, remembering, or making decisions?: No  Permission Sought/Granted                  Emotional Assessment Appearance:: Appears stated age Attitude/Demeanor/Rapport: Engaged Affect (typically observed): Accepting Orientation: : Oriented to Self, Oriented to Place, Oriented to  Time, Oriented to Situation   Psych Involvement: No (comment)  Admission diagnosis:  Dizziness [R42] Gait abnormality [R26.9] CVA (cerebral vascular accident) (HCC) [I63.9] Right ear pain [H92.01] Cerebrovascular accident (CVA), unspecified mechanism (HCC) [I63.9] Cerebellar stroke, acute (HCC) [I63.9] Patient Active Problem List   Diagnosis Date Noted   Cerebellar stroke, acute (HCC) 07/07/2023   CVA (cerebral vascular accident) (HCC) 07/05/2023   Headache 07/05/2023   Prediabetes 02/01/2023   CKD (chronic kidney disease) stage 2, GFR 60-89 ml/min 02/01/2023   Nausea 02/01/2023   Colon cancer screening 02/01/2023   CHF (congestive heart failure) (HCC) 01/23/2023   Numbness and tingling of both lower extremities 01/23/2023   Encounter for general adult medical examination with abnormal findings 01/23/2023   Allergic rhinitis 01/23/2022   Asthmatic bronchitis, mild  persistent, uncomplicated 01/23/2022   Upper airway cough syndrome 01/23/2022   Simple chronic bronchitis (HCC) 12/28/2021   Seasonal allergies 12/28/2021   DOE (dyspnea on exertion)    Nosebleed 10/25/2014   Lung cancer (HCC) 02/05/2010   CAD (coronary artery disease), native coronary artery 02/05/2010   Mixed hyperlipidemia 08/16/2009   Essential hypertension 07/08/2009   PCP:  [name], MD Pharmacy:    [facility] - [place], [place] - [address] [place] [address] Phone: [phone] Fax: [phone]  [facility] [address] [place] [address] Phone: [phone] Fax: [phone]     Social Determinants of Health (SDOH) Social History: SDOH Screenings   Food Insecurity: No Food Insecurity (07/05/2023)  Housing: Low Risk  (07/05/2023)  Transportation Needs: No Transportation Needs (07/05/2023)  Utilities: Not At Risk (07/05/2023)  Alcohol Screen: Medium Risk (04/03/2023)  Depression (PHQ2-9): Medium Risk (04/30/2023)  Tobacco Use: Medium Risk (07/05/2023)   SDOH Interventions:     Readmission Risk Interventions     No data to display

## 2023-07-08 NOTE — Evaluation (Signed)
Speech Language Pathology Evaluation Patient Details Name: Jeremy Lopez MRN: [id number] DOB: 01-May-1951 Today's Date: 07/08/2023 Time: 4098-1191 SLP Time Calculation (min) (ACUTE ONLY): 13 min  Problem List:  Patient Active Problem List   Diagnosis Date Noted   Cerebellar stroke, acute (HCC) 07/07/2023   CVA (cerebral vascular accident) (HCC) 07/05/2023   Headache 07/05/2023   Prediabetes 02/01/2023   CKD (chronic kidney disease) stage 2, GFR 60-89 ml/min 02/01/2023   Nausea 02/01/2023   Colon cancer screening 02/01/2023   CHF (congestive heart failure) (HCC) 01/23/2023   Numbness and tingling of both lower extremities 01/23/2023   Encounter for general adult medical examination with abnormal findings 01/23/2023   Allergic rhinitis 01/23/2022   Asthmatic bronchitis, mild persistent, uncomplicated 01/23/2022   Upper airway cough syndrome 01/23/2022   Simple chronic bronchitis (HCC) 12/28/2021   Seasonal allergies 12/28/2021   DOE (dyspnea on exertion)    Nosebleed 10/25/2014   Lung cancer (HCC) 02/05/2010   CAD (coronary artery disease), native coronary artery 02/05/2010   Mixed hyperlipidemia 08/16/2009   Essential hypertension 07/08/2009   Past Medical History:  Past Medical History:  Diagnosis Date   CAD (coronary artery disease), native coronary artery    DES proximal LAD 07/2009; DES to RCA 04/2020   Chronic heart failure with preserved ejection fraction (HFpEF) (HCC)    Essential hypertension    Habitual alcohol use    Hyperlipidemia    PVC's (premature ventricular contractions)    Small cell lung cancer (HCC)    Right upper lobe 2008; no evidence of disease since treatment ended in 11/2007   Past Surgical History:  Past Surgical History:  Procedure Laterality Date   CORONARY ATHERECTOMY N/A 04/26/2020   Procedure: CORONARY ATHERECTOMY;  Surgeon: Corky Crafts, MD;  Location: [facility] INVASIVE CV LAB;  Service: Cardiovascular;  Laterality: N/A;   CORONARY STENT  INTERVENTION N/A 04/26/2020   Procedure: CORONARY STENT INTERVENTION;  Surgeon: Corky Crafts, MD;  Location: [facility] INVASIVE CV LAB;  Service: Cardiovascular;  Laterality: N/A;   CORONARY ULTRASOUND/IVUS N/A 04/26/2020   Procedure: Intravascular Ultrasound/IVUS;  Surgeon: Corky Crafts, MD;  Location: [facility] INVASIVE CV LAB;  Service: Cardiovascular;  Laterality: N/A;   Insertion of left subclavian Port-A-Cath  2008   LEFT HEART CATH AND CORONARY ANGIOGRAPHY N/A 07/03/2022   Procedure: LEFT HEART CATH AND CORONARY ANGIOGRAPHY;  Surgeon: [place], Peter M, MD;  Location: [facility] INVASIVE CV LAB;  Service: Cardiovascular;  Laterality: N/A;   RIGHT/LEFT HEART CATH AND CORONARY ANGIOGRAPHY N/A 04/26/2020   Procedure: RIGHT/LEFT HEART CATH AND CORONARY ANGIOGRAPHY;  Surgeon: Corky Crafts, MD;  Location: [facility] INVASIVE CV LAB;  Service: Cardiovascular;  Laterality: N/A;   TEMPORARY PACEMAKER N/A 04/26/2020   Procedure: TEMPORARY PACEMAKER;  Surgeon: Corky Crafts, MD;  Location: [facility] INVASIVE CV LAB;  Service: Cardiovascular;  Laterality: N/A;   Video bronchoscopy and video mediastinoscopy  2008   HPI:      Assessment / Plan / Recommendation Clinical Impression  Pt lives at home with his spouse and handles his finances and medications. He denies difficulty with speech-language-cognition as does his daughter. He is concerned about balance and states he is highly motivated to go to inpatient rehab. The pt scored a 24/30 on the SLUMS which is considered in the mild impairment range with word recall difficulty (recalled 2/5 words) but correct with semantic cues (retrieval deficits). Decreased divergent naming with 11 animals in one minute. He missed 1/4 on answering questions from paragraph. Despite deficits  on word recall he was able to state plan for disposition to rehab, activities of this morning, that daughter requested wash up from nursing and that he requested pain meds from nurse. Overall, he  appears to be functional and safe for cognition in the acute setting and SLP recommends he have reassessment on CIR with higher level and more challenging information/material. ST will not see while on acute.    SLP Assessment  SLP Recommendation/Assessment: All further Speech Lanaguage Pathology  needs can be addressed in the next venue of care SLP Visit Diagnosis: Cognitive communication deficit (R41.841)    Recommendations for follow up therapy are one component of a multi-disciplinary discharge planning process, led by the attending physician.  Recommendations may be updated based on patient status, additional functional criteria and insurance authorization.    Follow Up Recommendations  Acute inpatient rehab (3hours/day)    Assistance Recommended at Discharge     Functional Status Assessment Patient has had a recent decline in their functional status and demonstrates the ability to make significant improvements in function in a reasonable and predictable amount of time.  Frequency and Duration           SLP Evaluation Cognition  Overall Cognitive Status: Impaired/Different from baseline Arousal/Alertness: Awake/alert Orientation Level: Oriented X4 Year: 2024 Day of Week: Correct Attention: Sustained Sustained Attention: Appears intact Memory: Impaired (2/5 words) Memory Impairment: Retrieval deficit Awareness: Appears intact Problem Solving: Appears intact Safety/Judgment: Appears intact       Comprehension  Auditory Comprehension Overall Auditory Comprehension: Appears within functional limits for tasks assessed Visual Recognition/Discrimination Discrimination: Not tested Reading Comprehension Reading Status: Not tested    Expression Expression Primary Mode of Expression: Verbal Verbal Expression Overall Verbal Expression: Appears within functional limits for tasks assessed Initiation: No impairment Level of Generative/Spontaneous Verbalization:  Conversation Repetition:  (NT) Naming: Impairment Divergent:  (named 11 animals in one minute) Pragmatics: No impairment Written Expression Dominant Hand: Right Written Expression: Not tested   Oral / Motor  Oral Motor/Sensory Function Overall Oral Motor/Sensory Function: Within functional limits Motor Speech Overall Motor Speech: Appears within functional limits for tasks assessed Respiration: Within functional limits Phonation: Normal Resonance: Within functional limits Articulation: Within functional limitis Intelligibility: Intelligible Motor Planning: Witnin functional limits Motor Speech Errors: Not applicable            Royce Macadamia 07/08/2023, 11:56 AM

## 2023-07-08 NOTE — Progress Notes (Addendum)
Inpatient Rehab Coordinator Note:  I met with patient and family at bedside to discuss CIR recommendations and goals/expectations of CIR stay.  We reviewed 3 hrs/day of therapy, physician follow up, and average length of stay 2 weeks (dependent upon progress) with goals of mod I. Patient is very interested in pursuing CIR. States his wife is home with him and can provide assistance as needed. Consult is pending and will open insurance as soon as completed.   Addendum- insurance opened today for prior approval. Continue to follow.  Rehab Admissons Coordinator [place], [place], [place] [phone]

## 2023-07-08 NOTE — Plan of Care (Signed)

## 2023-07-09 ENCOUNTER — Inpatient Hospital Stay
Admission: RE | Admit: 2023-07-09 | Discharge: 2023-07-09 | Disposition: A | Discharge status: Home / Self Care | Payer: HMO | Source: Ambulatory Visit | Attending: Diagnostic Neuroimaging | Admitting: Diagnostic Neuroimaging

## 2023-07-09 ENCOUNTER — Encounter (HOSPITAL_COMMUNITY): Payer: Self-pay | Admitting: *Deleted

## 2023-07-09 ENCOUNTER — Encounter (HOSPITAL_COMMUNITY): Payer: HMO

## 2023-07-09 DIAGNOSIS — I509 Heart failure, unspecified: Secondary | ICD-10-CM

## 2023-07-09 DIAGNOSIS — I639 Cerebral infarction, unspecified: Secondary | ICD-10-CM | POA: Diagnosis not present

## 2023-07-09 NOTE — Plan of Care (Signed)
  Problem: Education: Goal: Knowledge of General Education information will improve Description: Including pain rating scale, medication(s)/side effects and non-pharmacologic comfort measures Outcome: Progressing   Problem: Clinical Measurements: Goal: Ability to maintain clinical measurements within normal limits will improve Outcome: Progressing Goal: Will remain free from infection Outcome: Progressing Goal: Diagnostic test results will improve Outcome: Progressing Goal: Respiratory complications will improve Outcome: Progressing Goal: Cardiovascular complication will be avoided Outcome: Progressing   Problem: Activity: Goal: Risk for activity intolerance will decrease Outcome: Progressing   Problem: Nutrition: Goal: Adequate nutrition will be maintained Outcome: Progressing   Problem: Coping: Goal: Level of anxiety will decrease Outcome: Progressing   Problem: Elimination: Goal: Will not experience complications related to bowel motility Outcome: Progressing Goal: Will not experience complications related to urinary retention Outcome: Progressing   Problem: Pain Managment: Goal: General experience of comfort will improve Outcome: Progressing   Problem: Safety: Goal: Ability to remain free from injury will improve Outcome: Progressing   Problem: Skin Integrity: Goal: Risk for impaired skin integrity will decrease Outcome: Progressing   Problem: Education: Goal: Knowledge of disease or condition will improve Outcome: Progressing   Problem: Self-Care: Goal: Ability to participate in self-care as condition permits will improve Outcome: Progressing Goal: Verbalization of feelings and concerns over difficulty with self-care will improve Outcome: Progressing Goal: Ability to communicate needs accurately will improve Outcome: Progressing   Problem: Nutrition: Goal: Risk of aspiration will decrease Outcome: Progressing

## 2023-07-09 NOTE — Progress Notes (Signed)
Discharge Progress Report  Patient Details  Name: Jeremy Lopez MRN: [id number] Date of Birth: 10-12-1950 Referring Provider:   Flowsheet Row PULMONARY REHAB OTHER RESP ORIENTATION from 03/08/2023 in [facility] CARDIAC REHABIL[facility]ATION  Referring Provider Dr. Dietrich Pates        Number of Visits: 17  Reason for Discharge:  Early Exit:  Admitted for stroke  Smoking History:  Social History   Tobacco Use  Smoking Status Former   Current packs/day: 0.00   Types: Cigarettes   Start date: 10/25/1966   Quit date: 10/25/1998   Years since quitting: 24.7  Smokeless Tobacco Never    Diagnosis:  Chronic congestive heart failure, unspecified heart failure type (HCC)  ADL UCSD:  Pulmonary Assessment Scores     Row Name 03/08/23 1345         ADL UCSD   ADL Phase Entry     SOB Score total 15     Rest 0     Walk 1     Stairs 1     Bath 0     Dress 0     Shop 2       CAT Score   CAT Score 14       mMRC Score   mMRC Score 2              Initial Exercise Prescription:  Initial Exercise Prescription - 03/08/23 1400       Date of Initial Exercise RX and Referring Provider   Date 03/08/23    Referring Provider Dr. Dietrich Pates    Expected Discharge Date 07/11/23      Treadmill   MPH 1.6    Grade 0    Minutes 17      NuStep   Level 1    SPM 80    Minutes 22      Prescription Details   Frequency (times per week) 2    Duration Progress to 30 minutes of continuous aerobic without signs/symptoms of physical distress      Intensity   THRR 40-80% of Max Heartrate 60-119    Ratings of Perceived Exertion 11-13    Perceived Dyspnea 0-4      Resistance Training   Training Prescription Yes    Weight 5    Reps 10-15             Discharge Exercise Prescription (Final Exercise Prescription Changes):  Exercise Prescription Changes - 06/13/23 1500       Response to Exercise   Blood Pressure (Admit) 134/62    Blood Pressure (Exit) 140/70    Heart Rate  (Admit) 72 bpm    Heart Rate (Exercise) 93 bpm    Heart Rate (Exit) 73 bpm    Oxygen Saturation (Admit) 95 %    Oxygen Saturation (Exercise) 94 %    Oxygen Saturation (Exit) 96 %    Rating of Perceived Exertion (Exercise) 11    Perceived Dyspnea (Exercise) 1    Duration Continue with 30 min of aerobic exercise without signs/symptoms of physical distress.    Intensity THRR unchanged      Progression   Progression Continue to progress workloads to maintain intensity without signs/symptoms of physical distress.      Resistance Training   Training Prescription Yes    Weight 5    Reps 10-15      Treadmill   MPH 1.4    Grade 0    Minutes 15    METs 2.07  NuStep   Level 2    SPM 86    Minutes 15    METs 1.6      Oxygen   Maintain Oxygen Saturation 88% or higher             Functional Capacity:  6 Minute Walk     Row Name 03/08/23 1346         6 Minute Walk   Phase Initial     Distance 1200 feet     Walk Time 6 minutes     # of Rest Breaks 0     MPH 2.27     METS 2.63     RPE 11     Perceived Dyspnea  11     VO2 Peak 9.2     Symptoms No     Resting HR 75 bpm     Resting BP 120/68     Resting Oxygen Saturation  95 %     Exercise Oxygen Saturation  during 6 min walk 93 %     Max Ex. HR 103 bpm     Max Ex. BP 148/76     2 Minute Post BP 124/76       Interval HR   1 Minute HR 95     2 Minute HR 100     3 Minute HR 100     4 Minute HR 101     5 Minute HR 102     6 Minute HR 103     2 Minute Post HR 83     Interval Heart Rate? Yes       Interval Oxygen   Interval Oxygen? Yes     Baseline Oxygen Saturation % 95 %     1 Minute Oxygen Saturation % 94 %     1 Minute Liters of Oxygen 0 L     2 Minute Oxygen Saturation % 93 %     2 Minute Liters of Oxygen 0 L     3 Minute Oxygen Saturation % 94 %     3 Minute Liters of Oxygen 0 L     4 Minute Oxygen Saturation % 94 %     4 Minute Liters of Oxygen 0 L     5 Minute Oxygen Saturation % 94 %     5  Minute Liters of Oxygen 0 L     6 Minute Oxygen Saturation % 94 %     6 Minute Liters of Oxygen 0 L     2 Minute Post Oxygen Saturation % 96 %     2 Minute Post Liters of Oxygen 0 L              Psychological, QOL, Others - Outcomes: PHQ 2/9:    04/30/2023    1:15 PM 04/03/2023    2:19 PM 03/08/2023    1:18 PM 01/18/2023   10:06 AM 10/21/2020    2:22 PM  Depression screen PHQ 2/9  Decreased Interest   0 0 0  Down, Depressed, Hopeless   0 0 0  PHQ - 2 Score   0 0 0  Altered sleeping   1 3 0  Tired, decreased energy   1 3 0  Change in appetite   0 0 0  Feeling bad or failure about yourself    0 0 0  Trouble concentrating   0 0 0  Moving slowly or fidgety/restless   0 0 0  Suicidal thoughts  0 0 0  PHQ-9 Score   2 6 0  Difficult doing work/chores   Not difficult at all  Not difficult at all     Information is confidential and restricted. Go to Review Flowsheets to unlock data.    Quality of Life:  Quality of Life - 03/08/23 1345       Quality of Life   Select Quality of Life      Quality of Life Scores   Health/Function Pre 21.38 %    Socioeconomic Pre 29 %    Psych/Spiritual Pre 30 %    Family Pre 27.6 %    GLOBAL Pre 25.41 %              Nutrition & Weight - Outcomes:  Pre Biometrics - 03/08/23 1404       Pre Biometrics   Height 5\' 10"  (1.778 m)    Weight 219 lb 5.7 oz (99.5 kg)    Waist Circumference 46.5 inches    Hip Circumference 44.5 inches    Waist to Hip Ratio 1.04 %    BMI (Calculated) 31.47    Triceps Skinfold 15 mm    % Body Fat 31.5 %    Grip Strength 39.8 kg    Flexibility 0 in    Single Leg Stand 8 seconds

## 2023-07-09 NOTE — Progress Notes (Signed)
CBG: No results for input(s): "GLUCAP" in the last 168 hours. Lipid Profile: No results for input(s): "CHOL", "HDL", "LDLCALC", "TRIG", "CHOLHDL", "LDLDIRECT" in the last 72 hours.  Thyroid Function Tests: No results for input(s): "TSH", "T4TOTAL", "FREET4", "T3FREE", "THYROIDAB" in the last 72 hours. Anemia Panel: No results for input(s): "VITAMINB12", "FOLATE", "FERRITIN", "TIBC", "IRON", "RETICCTPCT" in the last 72 hours. Sepsis Labs: No results for input(s): "PROCALCITON", "LATICACIDVEN" in the last 168 hours.  No results found for this or any previous visit (from the past 240 hour(s)).       Radiology Studies: ECHOCARDIOGRAM  COMPLETE  Result Date: 07/07/2023    ECHOCARDIOGRAM REPORT   Patient Name:   Jeremy Lopez Date of Exam: 07/07/2023 Medical Rec #:  [id number]      Height:       70.0 in Accession #:    [id number]     Weight:       215.2 lb Date of Birth:  06-27-1951      BSA:          2.153 m Patient Age:    72 years       BP:           149/81 mmHg Patient Gender: M              HR:           84 bpm. Exam Location:  Inpatient Procedure: 2D Echo, Color Doppler and Cardiac Doppler Indications:    Stroke  History:        Patient has prior history of Echocardiogram examinations, most                 recent 06/14/2022. HFpEF, CAD, Arrythmias:PVC; Risk                 Factors:Dyslipidemia and Hypertension. Lung CA.  Sonographer:    Milda Smart Referring Phys: [id number] ASIA B ZIERLE-GHOSH IMPRESSIONS  1. Left ventricular ejection fraction, by estimation, is 60 to 65%. The left ventricle has normal function. The left ventricle has no regional wall motion abnormalities. There is mild left ventricular hypertrophy. Left ventricular diastolic parameters are consistent with Grade I diastolic dysfunction (impaired relaxation).  2. Right ventricular systolic function is normal. The right ventricular size is normal. Tricuspid regurgitation signal is inadequate for assessing PA pressure.  3. The mitral valve is normal in structure. No evidence of mitral valve regurgitation.  4. The aortic valve is tricuspid. Aortic valve regurgitation is not visualized. Aortic valve sclerosis is present, with no evidence of aortic valve stenosis.  5. The inferior vena cava is normal in size with greater than 50% respiratory variability, suggesting right atrial pressure of 3 mmHg. FINDINGS  Left Ventricle: Left ventricular ejection fraction, by estimation, is 60 to 65%. The left ventricle has normal function. The left ventricle has no regional wall motion abnormalities. The left ventricular internal cavity size was small. There is mild left ventricular  hypertrophy. Left ventricular diastolic parameters are consistent with Grade I diastolic dysfunction (impaired relaxation). Right Ventricle: The right ventricular size is normal. No increase in right ventricular wall thickness. Right ventricular systolic function is normal. Tricuspid regurgitation signal is inadequate for assessing PA pressure. Left Atrium: Left atrial size was normal in size. Right Atrium: Right atrial size was normal in size. Pericardium: There is no evidence of pericardial effusion. Mitral Valve: The mitral valve is normal in structure. No evidence of mitral valve regurgitation. MV peak gradient, 5.3 mmHg. The mean mitral valve gradient  PROGRESS NOTE    Jeremy Lopez  GNF:[id number] DOB: 1951/09/09 DOA: 07/05/2023 PCP: [name], MD    Brief Narrative:   Jeremy Lopez is a 72 y.o. male with past medical history significant for essential hypertension, CAD s/p PCI/DES, HLD, major depressive disorder, Hx lung cancer s/p chemotherapy/radiation, asthmatic bronchitis, EtOH use, former tobacco abuse who presented to [facility] ED on 9/27 via EMS for right-sided weakness, headache, disequilibrium.  Also endorsed paresthesias to right side of face and bilateral upper extremities that subsided in 10 minutes.  Initial symptoms onset at 4 AM in which he was feeling fine at 2 AM.  Given patient having significant trouble getting in downstairs, EMS was activated and patient was brought to ED for further evaluation.  In the ED, temperature 97.8 F, HR 75, RR 14, BP 158/74, SpO2 94% on room air.  WBC 10.8, hemoglobin 14.6, platelet count 339.  Sodium 134, potassium 3.6, chloride 101, CO2 22, glucose 110, BUN 16, creat 1.06.  AST 12, ALT 15, total bili Ruben 0.8.  Total cholesterol 107, LDL 38, HDL 48.  Urinalysis unrevealing.  UDS negative.  EtOH level less than 10.  CT head without contrast with chronic ventriculomegaly, stable since recent MRI.  CT angiogram head/neck with no large vessel occlusion.  MRI brain without contrast with early subacute versus acute infarct medial inferior right cerebellum.  EKG with NSR.  Neurology was consulted, recommended transfer to [facility] for continued stroke evaluation.  TRH consulted for admission and patient was transferred to [facility].  Assessment & Plan:    Acute ischemic cerebellar CVA Patient presenting to ED with disequilibrium, right sided weakness with paresthesias.  Last known normal 2 AM on 07/06/2023.  CT head with noted chronic ventriculomegaly.  CT angiogram head/neck with no large vessel occlusion.  MRI brain without contrast with acute versus early  subacute infarct medial inferior right cerebellum.  LDL 34, hemoglobin A1c 5.7.  TTE with LVEF 60 to 65%, mild LVH, grade 1 diastolic dysfunction, IVC normal in size.  Seen by neurology with recommendation of aspirin and Plavix x 3 weeks followed by aspirin alone.  Seen by PT/OT with recommendation of CIR placement.  Outpatient follow-up with neurology 3-4 weeks. -- Pending CIR placement; medically stable for discharge once bed available and receives insurance authorization  Concern for normal pressure hydrocephalus CT head with chronic ventriculomegaly, stable since recent MRI.  Symptoms have persisted over the last 11 months, currently following with neurology, Dr. Marjory Lies outpatient.  Was scheduled for an LP this upcoming week but will have to postpone given new stroke diagnosis. -- Recommend LP reschedule after 3 weeks of DAPT.  Essential hypertension Home regimen includes diltiazem 240 mg p.o. daily, metoprolol succinate 100 mg p.o. daily. -- Restart home metoprolol succinate 100 mg p.o. daily today -- Diltiazem 240 mg p.o. daily; start tomorrow  CAD s/p PCI/DES Follows with cardiology outpatient.  Continue aspirin and statin.  Hyperlipidemia Total cholesterol 107, LDL 38, HDL 48. -- Continue home Crestor 40 mg p.o. daily  Major depressive disorder -- Lexapro 10 mg p.o. daily  History of lung cancer s/p chemotherapy/radiation Follows with pulmonology outpatient, continue outpatient pulmonary rehab.  Asthmatic bronchitis Former tobacco use disorder Follow with pulmonology outpatient, Dr. Tonia Brooms.  On Trelegy Ellipta outpatient. -- Continue Breo Ellipta and Incruse Ellipta as hospital substitution 1 puff daily  EtOH use disorder Counseled on need for complete cessation/abstinence.  Obese Body mass index is 30.87 kg/m.  CBG: No results for input(s): "GLUCAP" in the last 168 hours. Lipid Profile: No results for input(s): "CHOL", "HDL", "LDLCALC", "TRIG", "CHOLHDL", "LDLDIRECT" in the last 72 hours.  Thyroid Function Tests: No results for input(s): "TSH", "T4TOTAL", "FREET4", "T3FREE", "THYROIDAB" in the last 72 hours. Anemia Panel: No results for input(s): "VITAMINB12", "FOLATE", "FERRITIN", "TIBC", "IRON", "RETICCTPCT" in the last 72 hours. Sepsis Labs: No results for input(s): "PROCALCITON", "LATICACIDVEN" in the last 168 hours.  No results found for this or any previous visit (from the past 240 hour(s)).       Radiology Studies: ECHOCARDIOGRAM  COMPLETE  Result Date: 07/07/2023    ECHOCARDIOGRAM REPORT   Patient Name:   Jeremy Lopez Date of Exam: 07/07/2023 Medical Rec #:  [id number]      Height:       70.0 in Accession #:    [id number]     Weight:       215.2 lb Date of Birth:  06-27-1951      BSA:          2.153 m Patient Age:    72 years       BP:           149/81 mmHg Patient Gender: M              HR:           84 bpm. Exam Location:  Inpatient Procedure: 2D Echo, Color Doppler and Cardiac Doppler Indications:    Stroke  History:        Patient has prior history of Echocardiogram examinations, most                 recent 06/14/2022. HFpEF, CAD, Arrythmias:PVC; Risk                 Factors:Dyslipidemia and Hypertension. Lung CA.  Sonographer:    Milda Smart Referring Phys: [id number] ASIA B ZIERLE-GHOSH IMPRESSIONS  1. Left ventricular ejection fraction, by estimation, is 60 to 65%. The left ventricle has normal function. The left ventricle has no regional wall motion abnormalities. There is mild left ventricular hypertrophy. Left ventricular diastolic parameters are consistent with Grade I diastolic dysfunction (impaired relaxation).  2. Right ventricular systolic function is normal. The right ventricular size is normal. Tricuspid regurgitation signal is inadequate for assessing PA pressure.  3. The mitral valve is normal in structure. No evidence of mitral valve regurgitation.  4. The aortic valve is tricuspid. Aortic valve regurgitation is not visualized. Aortic valve sclerosis is present, with no evidence of aortic valve stenosis.  5. The inferior vena cava is normal in size with greater than 50% respiratory variability, suggesting right atrial pressure of 3 mmHg. FINDINGS  Left Ventricle: Left ventricular ejection fraction, by estimation, is 60 to 65%. The left ventricle has normal function. The left ventricle has no regional wall motion abnormalities. The left ventricular internal cavity size was small. There is mild left ventricular  hypertrophy. Left ventricular diastolic parameters are consistent with Grade I diastolic dysfunction (impaired relaxation). Right Ventricle: The right ventricular size is normal. No increase in right ventricular wall thickness. Right ventricular systolic function is normal. Tricuspid regurgitation signal is inadequate for assessing PA pressure. Left Atrium: Left atrial size was normal in size. Right Atrium: Right atrial size was normal in size. Pericardium: There is no evidence of pericardial effusion. Mitral Valve: The mitral valve is normal in structure. No evidence of mitral valve regurgitation. MV peak gradient, 5.3 mmHg. The mean mitral valve gradient  PROGRESS NOTE    Jeremy Lopez  GNF:[id number] DOB: 1951/09/09 DOA: 07/05/2023 PCP: [name], MD    Brief Narrative:   Jeremy Lopez is a 72 y.o. male with past medical history significant for essential hypertension, CAD s/p PCI/DES, HLD, major depressive disorder, Hx lung cancer s/p chemotherapy/radiation, asthmatic bronchitis, EtOH use, former tobacco abuse who presented to [facility] ED on 9/27 via EMS for right-sided weakness, headache, disequilibrium.  Also endorsed paresthesias to right side of face and bilateral upper extremities that subsided in 10 minutes.  Initial symptoms onset at 4 AM in which he was feeling fine at 2 AM.  Given patient having significant trouble getting in downstairs, EMS was activated and patient was brought to ED for further evaluation.  In the ED, temperature 97.8 F, HR 75, RR 14, BP 158/74, SpO2 94% on room air.  WBC 10.8, hemoglobin 14.6, platelet count 339.  Sodium 134, potassium 3.6, chloride 101, CO2 22, glucose 110, BUN 16, creat 1.06.  AST 12, ALT 15, total bili Ruben 0.8.  Total cholesterol 107, LDL 38, HDL 48.  Urinalysis unrevealing.  UDS negative.  EtOH level less than 10.  CT head without contrast with chronic ventriculomegaly, stable since recent MRI.  CT angiogram head/neck with no large vessel occlusion.  MRI brain without contrast with early subacute versus acute infarct medial inferior right cerebellum.  EKG with NSR.  Neurology was consulted, recommended transfer to [facility] for continued stroke evaluation.  TRH consulted for admission and patient was transferred to [facility].  Assessment & Plan:    Acute ischemic cerebellar CVA Patient presenting to ED with disequilibrium, right sided weakness with paresthesias.  Last known normal 2 AM on 07/06/2023.  CT head with noted chronic ventriculomegaly.  CT angiogram head/neck with no large vessel occlusion.  MRI brain without contrast with acute versus early  subacute infarct medial inferior right cerebellum.  LDL 34, hemoglobin A1c 5.7.  TTE with LVEF 60 to 65%, mild LVH, grade 1 diastolic dysfunction, IVC normal in size.  Seen by neurology with recommendation of aspirin and Plavix x 3 weeks followed by aspirin alone.  Seen by PT/OT with recommendation of CIR placement.  Outpatient follow-up with neurology 3-4 weeks. -- Pending CIR placement; medically stable for discharge once bed available and receives insurance authorization  Concern for normal pressure hydrocephalus CT head with chronic ventriculomegaly, stable since recent MRI.  Symptoms have persisted over the last 11 months, currently following with neurology, Dr. Marjory Lies outpatient.  Was scheduled for an LP this upcoming week but will have to postpone given new stroke diagnosis. -- Recommend LP reschedule after 3 weeks of DAPT.  Essential hypertension Home regimen includes diltiazem 240 mg p.o. daily, metoprolol succinate 100 mg p.o. daily. -- Restart home metoprolol succinate 100 mg p.o. daily today -- Diltiazem 240 mg p.o. daily; start tomorrow  CAD s/p PCI/DES Follows with cardiology outpatient.  Continue aspirin and statin.  Hyperlipidemia Total cholesterol 107, LDL 38, HDL 48. -- Continue home Crestor 40 mg p.o. daily  Major depressive disorder -- Lexapro 10 mg p.o. daily  History of lung cancer s/p chemotherapy/radiation Follows with pulmonology outpatient, continue outpatient pulmonary rehab.  Asthmatic bronchitis Former tobacco use disorder Follow with pulmonology outpatient, Dr. Tonia Brooms.  On Trelegy Ellipta outpatient. -- Continue Breo Ellipta and Incruse Ellipta as hospital substitution 1 puff daily  EtOH use disorder Counseled on need for complete cessation/abstinence.  Obese Body mass index is 30.87 kg/m.  PROGRESS NOTE    Jeremy Lopez  GNF:[id number] DOB: 1951/09/09 DOA: 07/05/2023 PCP: [name], MD    Brief Narrative:   Jeremy Lopez is a 72 y.o. male with past medical history significant for essential hypertension, CAD s/p PCI/DES, HLD, major depressive disorder, Hx lung cancer s/p chemotherapy/radiation, asthmatic bronchitis, EtOH use, former tobacco abuse who presented to [facility] ED on 9/27 via EMS for right-sided weakness, headache, disequilibrium.  Also endorsed paresthesias to right side of face and bilateral upper extremities that subsided in 10 minutes.  Initial symptoms onset at 4 AM in which he was feeling fine at 2 AM.  Given patient having significant trouble getting in downstairs, EMS was activated and patient was brought to ED for further evaluation.  In the ED, temperature 97.8 F, HR 75, RR 14, BP 158/74, SpO2 94% on room air.  WBC 10.8, hemoglobin 14.6, platelet count 339.  Sodium 134, potassium 3.6, chloride 101, CO2 22, glucose 110, BUN 16, creat 1.06.  AST 12, ALT 15, total bili Ruben 0.8.  Total cholesterol 107, LDL 38, HDL 48.  Urinalysis unrevealing.  UDS negative.  EtOH level less than 10.  CT head without contrast with chronic ventriculomegaly, stable since recent MRI.  CT angiogram head/neck with no large vessel occlusion.  MRI brain without contrast with early subacute versus acute infarct medial inferior right cerebellum.  EKG with NSR.  Neurology was consulted, recommended transfer to [facility] for continued stroke evaluation.  TRH consulted for admission and patient was transferred to [facility].  Assessment & Plan:    Acute ischemic cerebellar CVA Patient presenting to ED with disequilibrium, right sided weakness with paresthesias.  Last known normal 2 AM on 07/06/2023.  CT head with noted chronic ventriculomegaly.  CT angiogram head/neck with no large vessel occlusion.  MRI brain without contrast with acute versus early  subacute infarct medial inferior right cerebellum.  LDL 34, hemoglobin A1c 5.7.  TTE with LVEF 60 to 65%, mild LVH, grade 1 diastolic dysfunction, IVC normal in size.  Seen by neurology with recommendation of aspirin and Plavix x 3 weeks followed by aspirin alone.  Seen by PT/OT with recommendation of CIR placement.  Outpatient follow-up with neurology 3-4 weeks. -- Pending CIR placement; medically stable for discharge once bed available and receives insurance authorization  Concern for normal pressure hydrocephalus CT head with chronic ventriculomegaly, stable since recent MRI.  Symptoms have persisted over the last 11 months, currently following with neurology, Dr. Marjory Lies outpatient.  Was scheduled for an LP this upcoming week but will have to postpone given new stroke diagnosis. -- Recommend LP reschedule after 3 weeks of DAPT.  Essential hypertension Home regimen includes diltiazem 240 mg p.o. daily, metoprolol succinate 100 mg p.o. daily. -- Restart home metoprolol succinate 100 mg p.o. daily today -- Diltiazem 240 mg p.o. daily; start tomorrow  CAD s/p PCI/DES Follows with cardiology outpatient.  Continue aspirin and statin.  Hyperlipidemia Total cholesterol 107, LDL 38, HDL 48. -- Continue home Crestor 40 mg p.o. daily  Major depressive disorder -- Lexapro 10 mg p.o. daily  History of lung cancer s/p chemotherapy/radiation Follows with pulmonology outpatient, continue outpatient pulmonary rehab.  Asthmatic bronchitis Former tobacco use disorder Follow with pulmonology outpatient, Dr. Tonia Brooms.  On Trelegy Ellipta outpatient. -- Continue Breo Ellipta and Incruse Ellipta as hospital substitution 1 puff daily  EtOH use disorder Counseled on need for complete cessation/abstinence.  Obese Body mass index is 30.87 kg/m.

## 2023-07-09 NOTE — Progress Notes (Signed)
Inpatient Rehab Admissions Coordinator:  Awaiting insurance authorization. Will continue to follow.   Gayland Curry, [place], [place] Admissions Coordinator [phone]

## 2023-07-09 NOTE — Progress Notes (Signed)
Pulmonary Individual Treatment Plan  Patient Details  Name: Jeremy Lopez MRN: [id number] Date of Birth: 02/16/1951 Referring Provider:   Flowsheet Row PULMONARY REHAB OTHER RESP ORIENTATION from 03/08/2023 in [facility] CARDIAC REHABILITATION  Referring Provider Dr. Dietrich Pates       Initial Encounter Date:  Flowsheet Row PULMONARY REHAB OTHER RESP ORIENTATION from 03/08/2023 in [facility] CARDIAC REH[facility]ILITATION  Date 03/08/23       Visit Diagnosis: No diagnosis found.  Patient's Home Medications on Admission:  No current facility-administered medications for this visit. No current outpatient medications on file.  Facility-Administered Medications Ordered in Other Visits:    acetaminophen (TYLENOL) tablet 650 mg, 650 mg, Oral, Q6H PRN, Zierle-Ghosh, Asia B, DO, 650 mg at 07/07/23 0940   aspirin EC tablet 81 mg, 81 mg, Oral, Daily, Zierle-Ghosh, Asia B, DO, 81 mg at 07/09/23 1012   clopidogrel (PLAVIX) tablet 75 mg, 75 mg, Oral, Daily, Zierle-Ghosh, Asia B, DO, 75 mg at 07/09/23 1012   diltiazem (CARDIZEM CD) 24 hr capsule 240 mg, 240 mg, Oral, Daily, [place], Eric J, DO, 240 mg at 07/09/23 1011   enoxaparin (LOVENOX) injection 40 mg, 40 mg, Subcutaneous, Q24H, Zierle-Ghosh, Asia B, DO, 40 mg at 07/09/23 1011   escitalopram (LEXAPRO) tablet 10 mg, 10 mg, Oral, Daily, Zierle-Ghosh, Asia B, DO, 10 mg at 07/09/23 1012   fluticasone furoate-vilanterol (BREO ELLIPTA) 100-25 MCG/ACT 1 puff, 1 puff, Inhalation, Daily, 1 puff at 07/09/23 0843 **AND** umeclidinium bromide (INCRUSE ELLIPTA) 62.5 MCG/ACT 1 puff, 1 puff, Inhalation, Daily, Zierle-Ghosh, Asia B, DO, 1 puff at 07/09/23 0843   gabapentin (NEURONTIN) capsule 300 mg, 300 mg, Oral, QHS, Zierle-Ghosh, Asia B, DO, 300 mg at 07/08/23 2210   HYDROcodone bit-homatropine (HYCODAN) 5-1.5 MG/5ML syrup 5 mL, 5 mL, Oral, Q4H PRN, [place], Eric J, DO, 5 mL at 07/08/23 2210   loratadine (CLARITIN) tablet 10 mg, 10 mg, Oral, Daily, [place], Eric J,  DO, 10 mg at 07/09/23 1012   metoprolol succinate (TOPROL-XL) 24 hr tablet 100 mg, 100 mg, Oral, Daily, [place], Eric J, DO, 100 mg at 07/09/23 1011   Oral care mouth rinse, 15 mL, Mouth Rinse, PRN, Zierle-Ghosh, Asia B, DO   rosuvastatin (CRESTOR) tablet 40 mg, 40 mg, Oral, Daily, Zierle-Ghosh, Asia B, DO, 40 mg at 07/09/23 1012   senna-docusate (Senokot-S) tablet 1 tablet, 1 tablet, Oral, QHS PRN, Zierle-Ghosh, Asia B, DO, 1 tablet at 07/09/23 1023   traZODone (DESYREL) tablet 50 mg, 50 mg, Oral, QHS, Zierle-Ghosh, Asia B, DO, 50 mg at 07/08/23 2210  Past Medical History: Past Medical History:  Diagnosis Date   CAD (coronary artery disease), native coronary artery    DES proximal LAD 07/2009; DES to RCA 04/2020   Chronic heart failure with preserved ejection fraction (HFpEF) (HCC)    Essential hypertension    Habitual alcohol use    Hyperlipidemia    PVC's (premature ventricular contractions)    Small cell lung cancer (HCC)    Right upper lobe 2008; no evidence of disease since treatment ended in 11/2007    Tobacco Use: Social History   Tobacco Use  Smoking Status Former   Current packs/day: 0.00   Types: Cigarettes   Start date: 10/25/1966   Quit date: 10/25/1998   Years since quitting: 24.7  Smokeless Tobacco Never    Labs: Review Flowsheet  More data exists      Latest Ref Rng & Units 06/13/2022 01/18/2023 06/27/2023 07/05/2023 07/06/2023  Labs for ITP Cardiac and Pulmonary  Rehab  Cholestrol 0 - 200 mg/dL 811  914  782  956  98   LDL (calc) 0 - 99 mg/dL 40  76  42  38  34   HDL-C >40 mg/dL 51  54  51  48  42   Trlycerides <150 mg/dL 213  086  578  469  629   Hemoglobin A1c 4.8 - 5.6 % - 5.8  - 5.7  -    Details            Capillary Blood Glucose: Lab Results  Component Value Date   GLUCAP 101 (H) 03/21/2023   GLUCAP 106 (H) 03/19/2023   GLUCAP 89 03/19/2023   GLUCAP 95 03/14/2023   GLUCAP 104 (H) 03/12/2023     Pulmonary Assessment Scores:  Pulmonary  Assessment Scores     Row Name 03/08/23 1345         ADL UCSD   ADL Phase Entry     SOB Score total 15     Rest 0     Walk 1     Stairs 1     Bath 0     Dress 0     Shop 2       CAT Score   CAT Score 14       mMRC Score   mMRC Score 2             UCSD: Self-administered rating of dyspnea associated with activities of daily living (ADLs) 6-point scale (0 = "not at all" to 5 = "maximal or unable to do because of breathlessness")  Scoring Scores range from 0 to 120.  Minimally important difference is 5 units  CAT: CAT can identify the health impairment of COPD patients and is better correlated with disease progression.  CAT has a scoring range of zero to 40. The CAT score is classified into four groups of low (less than 10), medium (10 - 20), high (21-30) and very high (31-40) based on the impact level of disease on health status. A CAT score over 10 suggests significant symptoms.  A worsening CAT score could be explained by an exacerbation, poor medication adherence, poor inhaler technique, or progression of COPD or comorbid conditions.  CAT MCID is 2 points  mMRC: mMRC (Modified Medical Research Council) Dyspnea Scale is used to assess the degree of baseline functional disability in patients of respiratory disease due to dyspnea. No minimal important difference is established. A decrease in score of 1 point or greater is considered a positive change.   Pulmonary Function Assessment:   Exercise Target Goals: Exercise Program Goal: Individual exercise prescription set using results from initial 6 min walk test and THRR while considering  patient's activity barriers and safety.   Exercise Prescription Goal: Initial exercise prescription builds to 30-45 minutes a day of aerobic activity, 2-3 days per week.  Home exercise guidelines will be given to patient during program as part of exercise prescription that the participant will acknowledge.  Activity Barriers & Risk  Stratification:  Activity Barriers & Cardiac Risk Stratification - 03/08/23 1246       Activity Barriers & Cardiac Risk Stratification   Activity Barriers Shortness of Breath    Cardiac Risk Stratification High             6 Minute Walk:  6 Minute Walk     Row Name 03/08/23 1346         6 Minute Walk   Phase Initial  Distance 1200 feet     Walk Time 6 minutes     # of Rest Breaks 0     MPH 2.27     METS 2.63     RPE 11     Perceived Dyspnea  11     VO2 Peak 9.2     Symptoms No     Resting HR 75 bpm     Resting BP 120/68     Resting Oxygen Saturation  95 %     Exercise Oxygen Saturation  during 6 min walk 93 %     Max Ex. HR 103 bpm     Max Ex. BP 148/76     2 Minute Post BP 124/76       Interval HR   1 Minute HR 95     2 Minute HR 100     3 Minute HR 100     4 Minute HR 101     5 Minute HR 102     6 Minute HR 103     2 Minute Post HR 83     Interval Heart Rate? Yes       Interval Oxygen   Interval Oxygen? Yes     Baseline Oxygen Saturation % 95 %     1 Minute Oxygen Saturation % 94 %     1 Minute Liters of Oxygen 0 L     2 Minute Oxygen Saturation % 93 %     2 Minute Liters of Oxygen 0 L     3 Minute Oxygen Saturation % 94 %     3 Minute Liters of Oxygen 0 L     4 Minute Oxygen Saturation % 94 %     4 Minute Liters of Oxygen 0 L     5 Minute Oxygen Saturation % 94 %     5 Minute Liters of Oxygen 0 L     6 Minute Oxygen Saturation % 94 %     6 Minute Liters of Oxygen 0 L     2 Minute Post Oxygen Saturation % 96 %     2 Minute Post Liters of Oxygen 0 L              Oxygen Initial Assessment:  Oxygen Initial Assessment - 03/08/23 1345       Home Oxygen   Home Oxygen Device None    Sleep Oxygen Prescription None    Home Exercise Oxygen Prescription None    Home Resting Oxygen Prescription None      Initial 6 min Walk   Oxygen Used None      Program Oxygen Prescription   Program Oxygen Prescription None      Intervention   Short  Term Goals To learn and understand importance of monitoring SPO2 with pulse oximeter and demonstrate accurate use of the pulse oximeter.;To learn and understand importance of maintaining oxygen saturations>88%;To learn and demonstrate proper pursed lip breathing techniques or other breathing techniques.     Long  Term Goals Verbalizes importance of monitoring SPO2 with pulse oximeter and return demonstration;Maintenance of O2 saturations>88%;Exhibits proper breathing techniques, such as pursed lip breathing or other method taught during program session;Compliance with respiratory medication             Oxygen Re-Evaluation:  Oxygen Re-Evaluation     Row Name 03/26/23 1300 04/23/23 1525 06/13/23 1618         Program Oxygen Prescription   Program Oxygen Prescription None None  None       Home Oxygen   Home Oxygen Device None None None     Sleep Oxygen Prescription None None None     Home Exercise Oxygen Prescription None None None     Home Resting Oxygen Prescription None None None       Goals/Expected Outcomes   Short Term Goals To learn and understand importance of monitoring SPO2 with pulse oximeter and demonstrate accurate use of the pulse oximeter.;To learn and understand importance of maintaining oxygen saturations>88%;To learn and demonstrate proper pursed lip breathing techniques or other breathing techniques.  To learn and understand importance of monitoring SPO2 with pulse oximeter and demonstrate accurate use of the pulse oximeter.;To learn and understand importance of maintaining oxygen saturations>88%;To learn and demonstrate proper pursed lip breathing techniques or other breathing techniques.  To learn and understand importance of monitoring SPO2 with pulse oximeter and demonstrate accurate use of the pulse oximeter.;To learn and understand importance of maintaining oxygen saturations>88%;To learn and demonstrate proper pursed lip breathing techniques or other breathing  techniques. ;To learn and demonstrate proper use of respiratory medications     Long  Term Goals Verbalizes importance of monitoring SPO2 with pulse oximeter and return demonstration;Maintenance of O2 saturations>88%;Exhibits proper breathing techniques, such as pursed lip breathing or other method taught during program session;Compliance with respiratory medication Verbalizes importance of monitoring SPO2 with pulse oximeter and return demonstration;Maintenance of O2 saturations>88%;Exhibits proper breathing techniques, such as pursed lip breathing or other method taught during program session;Compliance with respiratory medication Maintenance of O2 saturations>88%;Exhibits proper breathing techniques, such as pursed lip breathing or other method taught during program session;Verbalizes importance of monitoring SPO2 with pulse oximeter and return demonstration;Compliance with respiratory medication     Comments -- -- JP is doing well in rehab.  His sats are doing well and he is checking them at home as well. He has really noticed a diffence in his breathing since using the PLB more.  He says that it has really made a difference for him.  He is also doing well with his Trellegy and not needed his albuterol inhaler recently.     Goals/Expected Outcomes -- -- Short: continue to use PLB Long; continue to manage breathing              Oxygen Discharge (Final Oxygen Re-Evaluation):  Oxygen Re-Evaluation - 06/13/23 1618       Program Oxygen Prescription   Program Oxygen Prescription None      Home Oxygen   Home Oxygen Device None    Sleep Oxygen Prescription None    Home Exercise Oxygen Prescription None    Home Resting Oxygen Prescription None      Goals/Expected Outcomes   Short Term Goals To learn and understand importance of monitoring SPO2 with pulse oximeter and demonstrate accurate use of the pulse oximeter.;To learn and understand importance of maintaining oxygen saturations>88%;To learn  and demonstrate proper pursed lip breathing techniques or other breathing techniques. ;To learn and demonstrate proper use of respiratory medications    Long  Term Goals Maintenance of O2 saturations>88%;Exhibits proper breathing techniques, such as pursed lip breathing or other method taught during program session;Verbalizes importance of monitoring SPO2 with pulse oximeter and return demonstration;Compliance with respiratory medication    Comments JP is doing well in rehab.  His sats are doing well and he is checking them at home as well. He has really noticed a diffence in his breathing since using the PLB more.  He says  that it has really made a difference for him.  He is also doing well with his Trellegy and not needed his albuterol inhaler recently.    Goals/Expected Outcomes Short: continue to use PLB Long; continue to manage breathing             Initial Exercise Prescription:  Initial Exercise Prescription - 03/08/23 1400       Date of Initial Exercise RX and Referring Provider   Date 03/08/23    Referring Provider Dr. Dietrich Pates    Expected Discharge Date 07/11/23      Treadmill   MPH 1.6    Grade 0    Minutes 17      NuStep   Level 1    SPM 80    Minutes 22      Prescription Details   Frequency (times per week) 2    Duration Progress to 30 minutes of continuous aerobic without signs/symptoms of physical distress      Intensity   THRR 40-80% of Max Heartrate 60-119    Ratings of Perceived Exertion 11-13    Perceived Dyspnea 0-4      Resistance Training   Training Prescription Yes    Weight 5    Reps 10-15             Perform Capillary Blood Glucose checks as needed.  Exercise Prescription Changes:   Exercise Prescription Changes     Row Name 03/12/23 1500 03/26/23 1500 04/04/23 1500 06/13/23 1500       Response to Exercise   Blood Pressure (Admit) 130/72 112/78 126/60 134/62    Blood Pressure (Exercise) 170/78 140/70 124/68 --    Blood Pressure  (Exit) 130/64 120/60 126/50 140/70    Heart Rate (Admit) 88 bpm 80 bpm 66 bpm 72 bpm    Heart Rate (Exercise) 100 bpm 102 bpm 93 bpm 93 bpm    Heart Rate (Exit) 84 bpm 86 bpm 68 bpm 73 bpm    Oxygen Saturation (Admit) 94 % 96 % 94 % 95 %    Oxygen Saturation (Exercise) 95 % 95 % 95 % 94 %    Oxygen Saturation (Exit) 95 % 95 % 94 % 96 %    Rating of Perceived Exertion (Exercise) 11 9 9 11     Perceived Dyspnea (Exercise) 12 9 9 1     Duration Continue with 30 min of aerobic exercise without signs/symptoms of physical distress. Continue with 30 min of aerobic exercise without signs/symptoms of physical distress. Continue with 30 min of aerobic exercise without signs/symptoms of physical distress. Continue with 30 min of aerobic exercise without signs/symptoms of physical distress.    Intensity THRR unchanged THRR unchanged THRR unchanged THRR unchanged      Progression   Progression Continue to progress workloads to maintain intensity without signs/symptoms of physical distress. Continue to progress workloads to maintain intensity without signs/symptoms of physical distress. Continue to progress workloads to maintain intensity without signs/symptoms of physical distress. Continue to progress workloads to maintain intensity without signs/symptoms of physical distress.      Resistance Training   Training Prescription Yes Yes Yes Yes    Weight 3 5 5 5     Reps 10-15 10-15 10-15 10-15    Time 10 Minutes 10 Minutes 10 Minutes --      Treadmill   MPH 1.3 1.5 1.5 1.4    Grade 0 0 0 0    Minutes 17 17 17 15     METs 2 2.15  2.15 2.07      NuStep   Level 1 2 2 2     SPM 78 85 76 86    Minutes 22 22 22 15     METs 1.6 1.6 1.6 1.6      Oxygen   Maintain Oxygen Saturation -- -- -- 88% or higher             Exercise Comments:   Exercise Goals and Review:   Exercise Goals     Row Name 03/08/23 1403 03/26/23 1248 04/23/23 1519         Exercise Goals   Increase Physical Activity Yes Yes  Yes     Intervention Provide advice, education, support and counseling about physical activity/exercise needs.;Develop an individualized exercise prescription for aerobic and resistive training based on initial evaluation findings, risk stratification, comorbidities and participant's personal goals. Provide advice, education, support and counseling about physical activity/exercise needs.;Develop an individualized exercise prescription for aerobic and resistive training based on initial evaluation findings, risk stratification, comorbidities and participant's personal goals. Provide advice, education, support and counseling about physical activity/exercise needs.;Develop an individualized exercise prescription for aerobic and resistive training based on initial evaluation findings, risk stratification, comorbidities and participant's personal goals.     Expected Outcomes Short Term: Attend rehab on a regular basis to increase amount of physical activity.;Long Term: Add in home exercise to make exercise part of routine and to increase amount of physical activity.;Long Term: Exercising regularly at least 3-5 days a week. Short Term: Attend rehab on a regular basis to increase amount of physical activity.;Long Term: Add in home exercise to make exercise part of routine and to increase amount of physical activity.;Long Term: Exercising regularly at least 3-5 days a week. Short Term: Attend rehab on a regular basis to increase amount of physical activity.;Long Term: Add in home exercise to make exercise part of routine and to increase amount of physical activity.;Long Term: Exercising regularly at least 3-5 days a week.     Increase Strength and Stamina Yes Yes Yes     Intervention Provide advice, education, support and counseling about physical activity/exercise needs.;Develop an individualized exercise prescription for aerobic and resistive training based on initial evaluation findings, risk stratification,  comorbidities and participant's personal goals. Provide advice, education, support and counseling about physical activity/exercise needs.;Develop an individualized exercise prescription for aerobic and resistive training based on initial evaluation findings, risk stratification, comorbidities and participant's personal goals. Provide advice, education, support and counseling about physical activity/exercise needs.;Develop an individualized exercise prescription for aerobic and resistive training based on initial evaluation findings, risk stratification, comorbidities and participant's personal goals.     Expected Outcomes Short Term: Increase workloads from initial exercise prescription for resistance, speed, and METs.;Short Term: Perform resistance training exercises routinely during rehab and add in resistance training at home;Long Term: Improve cardiorespiratory fitness, muscular endurance and strength as measured by increased METs and functional capacity ( ) Short Term: Increase workloads from initial exercise prescription for resistance, speed, and METs.;Short Term: Perform resistance training exercises routinely during rehab and add in resistance training at home;Long Term: Improve cardiorespiratory fitness, muscular endurance and strength as measured by increased METs and functional capacity ( ) Short Term: Increase workloads from initial exercise prescription for resistance, speed, and METs.;Short Term: Perform resistance training exercises routinely during rehab and add in resistance training at home;Long Term: Improve cardiorespiratory fitness, muscular endurance and strength as measured by increased METs and functional capacity ( )     Able to understand and use rate of  perceived exertion (RPE) scale Yes Yes Yes     Intervention Provide education and explanation on how to use RPE scale Provide education and explanation on how to use RPE scale Provide education and explanation on how to use RPE  scale     Expected Outcomes Short Term: Able to use RPE daily in rehab to express subjective intensity level;Long Term:  Able to use RPE to guide intensity level when exercising independently Short Term: Able to use RPE daily in rehab to express subjective intensity level;Long Term:  Able to use RPE to guide intensity level when exercising independently Short Term: Able to use RPE daily in rehab to express subjective intensity level;Long Term:  Able to use RPE to guide intensity level when exercising independently     Able to understand and use Dyspnea scale Yes Yes Yes     Intervention Provide education and explanation on how to use Dyspnea scale Provide education and explanation on how to use Dyspnea scale Provide education and explanation on how to use Dyspnea scale     Expected Outcomes Short Term: Able to use Dyspnea scale daily in rehab to express subjective sense of shortness of breath during exertion;Long Term: Able to use Dyspnea scale to guide intensity level when exercising independently Short Term: Able to use Dyspnea scale daily in rehab to express subjective sense of shortness of breath during exertion;Long Term: Able to use Dyspnea scale to guide intensity level when exercising independently Short Term: Able to use Dyspnea scale daily in rehab to express subjective sense of shortness of breath during exertion;Long Term: Able to use Dyspnea scale to guide intensity level when exercising independently     Knowledge and understanding of Target Heart Rate Range (THRR) Yes Yes Yes     Intervention Provide education and explanation of THRR including how the numbers were predicted and where they are located for reference Provide education and explanation of THRR including how the numbers were predicted and where they are located for reference Provide education and explanation of THRR including how the numbers were predicted and where they are located for reference     Expected Outcomes Short Term:  Able to state/look up THRR;Short Term: Able to use daily as guideline for intensity in rehab;Long Term: Able to use THRR to govern intensity when exercising independently Short Term: Able to state/look up THRR;Short Term: Able to use daily as guideline for intensity in rehab;Long Term: Able to use THRR to govern intensity when exercising independently Short Term: Able to state/look up THRR;Short Term: Able to use daily as guideline for intensity in rehab;Long Term: Able to use THRR to govern intensity when exercising independently     Able to check pulse independently -- Yes --     Understanding of Exercise Prescription Yes Yes Yes     Intervention Provide education, explanation, and written materials on patient's individual exercise prescription Provide education, explanation, and written materials on patient's individual exercise prescription Provide education, explanation, and written materials on patient's individual exercise prescription     Expected Outcomes Short Term: Able to explain program exercise prescription;Long Term: Able to explain home exercise prescription to exercise independently Short Term: Able to explain program exercise prescription;Long Term: Able to explain home exercise prescription to exercise independently Short Term: Able to explain program exercise prescription;Long Term: Able to explain home exercise prescription to exercise independently              Exercise Goals Re-Evaluation :  Exercise Goals Re-Evaluation  Row Name 03/26/23 1249 04/23/23 1519 06/13/23 1610 06/18/23 0859       Exercise Goal Re-Evaluation   Exercise Goals Review Increase Physical Activity;Increase Strength and Stamina;Able to understand and use rate of perceived exertion (RPE) scale;Able to understand and use Dyspnea scale;Knowledge and understanding of Target Heart Rate Range (THRR);Understanding of Exercise Prescription Increase Physical Activity;Increase Strength and Stamina;Able to  understand and use rate of perceived exertion (RPE) scale;Able to understand and use Dyspnea scale;Knowledge and understanding of Target Heart Rate Range (THRR);Understanding of Exercise Prescription -- Increase Physical Activity;Increase Strength and Stamina;Able to understand and use Dyspnea scale;Understanding of Exercise Prescription    Comments Pt has completed 4 sessions of pulmoanry rehab. He is tolerating exercise and progressing his workloads. He is currently exercising at 2.15 METs on the treadmill. Will continue to monitor and progress as able. Pt has completed 9 sessions of pulmonary rehab. He continues to be motivated and in progressing in his workloads. He is currently exercising at 2.15 METs on the treadmill. Will continue to monitor and progress as able . JP is doing well in rehab.  He is already using a treadmill at home for 15 min each day.  He says one of his biggest limitations is his weaker legs.  He was encouraged to do more weights at home and to walk longer to build up the stamina.  He has noticed that he is feeling stronger overall, but legs are still weak. JD is doing well in rehab. He has not increased his treadmill speed due to weak legs and shuffling but has improved his NuStep level to 2. Will continue to monitor and progress as able.    Expected Outcomes Through exercising at rehab and a home exercise plan, patient will achieve his goals. Through exercising at rehab and a home exercise plan, patient will achieve his goals. Short: Add in more weights at home to build strength Long: Continue to improve stamina Short: increase NuStep level to 3 in the next two weeeks   long term: continue to attend pulm rehab and exercise at home             Discharge Exercise Prescription (Final Exercise Prescription Changes):  Exercise Prescription Changes - 06/13/23 1500       Response to Exercise   Blood Pressure (Admit) 134/62    Blood Pressure (Exit) 140/70    Heart Rate (Admit) 72  bpm    Heart Rate (Exercise) 93 bpm    Heart Rate (Exit) 73 bpm    Oxygen Saturation (Admit) 95 %    Oxygen Saturation (Exercise) 94 %    Oxygen Saturation (Exit) 96 %    Rating of Perceived Exertion (Exercise) 11    Perceived Dyspnea (Exercise) 1    Duration Continue with 30 min of aerobic exercise without signs/symptoms of physical distress.    Intensity THRR unchanged      Progression   Progression Continue to progress workloads to maintain intensity without signs/symptoms of physical distress.      Resistance Training   Training Prescription Yes    Weight 5    Reps 10-15      Treadmill   MPH 1.4    Grade 0    Minutes 15    METs 2.07      NuStep   Level 2    SPM 86    Minutes 15    METs 1.6      Oxygen   Maintain Oxygen Saturation 88% or higher  Nutrition:  Target Goals: Understanding of nutrition guidelines, daily intake of sodium 1500mg , cholesterol 200mg , calories 30% from fat and 7% or less from saturated fats, daily to have 5 or more servings of fruits and vegetables.  Biometrics:  Pre Biometrics - 03/08/23 1404       Pre Biometrics   Height 5\' 10"  (1.778 m)    Weight 219 lb 5.7 oz (99.5 kg)    Waist Circumference 46.5 inches    Hip Circumference 44.5 inches    Waist to Hip Ratio 1.04 %    BMI (Calculated) 31.47    Triceps Skinfold 15 mm    % Body Fat 31.5 %    Grip Strength 39.8 kg    Flexibility 0 in    Single Leg Stand 8 seconds              Nutrition Therapy Plan and Nutrition Goals:  Nutrition Therapy & Goals - 04/15/23 1427       Nutrition Therapy   RD appointment deferred Yes      Personal Nutrition Goals   Comments We provide educational sessions on heart healthy nutrition with handouts.      Intervention Plan   Intervention Nutrition handout(s) given to patient.    Expected Outcomes Short Term Goal: Understand basic principles of dietary content, such as calories, fat, sodium, cholesterol and nutrients.              Nutrition Assessments:  Nutrition Assessments - 03/08/23 1323       MEDFICTS Scores   Pre Score 15            MEDIFICTS Score Key: >=70 Need to make dietary changes  40-70 Heart Healthy Diet <= 40 Therapeutic Level Cholesterol Diet   Picture Your Plate Scores: <21 Unhealthy dietary pattern with much room for improvement. 41-50 Dietary pattern unlikely to meet recommendations for good health and room for improvement. 51-60 More healthful dietary pattern, with some room for improvement.  >60 Healthy dietary pattern, although there may be some specific behaviors that could be improved.    Nutrition Goals Re-Evaluation:  Nutrition Goals Re-Evaluation     Row Name 06/13/23 1614             Goals   Nutrition Goal Continue to eat healthy       Comment JP is doing well in rehab.  His diet is going well and he enjoys eating.  He does admit to needing to work on portion control, but otherwise feels like he has a good handle on what to eat or not eat.  They eat lots of fruits and vegetables.  His motto for protein is that it has to "fly or swim, or don't eat it"!  His wife is supportive in his diet as well.       Expected Outcome Short: COnitnue to work on portion control Long; Continue to eat healthy                Nutrition Goals Discharge (Final Nutrition Goals Re-Evaluation):  Nutrition Goals Re-Evaluation - 06/13/23 1614       Goals   Nutrition Goal Continue to eat healthy    Comment JP is doing well in rehab.  His diet is going well and he enjoys eating.  He does admit to needing to work on portion control, but otherwise feels like he has a good handle on what to eat or not eat.  They eat lots of fruits and vegetables.  His motto  for protein is that it has to "fly or swim, or don't eat it"!  His wife is supportive in his diet as well.    Expected Outcome Short: COnitnue to work on portion control Long; Continue to eat healthy              Psychosocial: Target Goals: Acknowledge presence or absence of significant depression and/or stress, maximize coping skills, provide positive support system. Participant is able to verbalize types and ability to use techniques and skills needed for reducing stress and depression.  Initial Review & Psychosocial Screening:  Initial Psych Review & Screening - 03/08/23 1327       Initial Review   Current issues with None Identified      Family Dynamics   Good Support System? Yes      Barriers   Psychosocial barriers to participate in program There are no identifiable barriers or psychosocial needs.;The patient should benefit from training in stress management and relaxation.      Screening Interventions   Interventions Encouraged to exercise;To provide support and resources with identified psychosocial needs    Expected Outcomes Short Term goal: Identification and review with participant of any Quality of Life or Depression concerns found by scoring the questionnaire.;Short Term goal: Utilizing psychosocial counselor, staff and physician to assist with identification of specific Stressors or current issues interfering with healing process. Setting desired goal for each stressor or current issue identified.             Quality of Life Scores:  Quality of Life - 03/08/23 1345       Quality of Life   Select Quality of Life      Quality of Life Scores   Health/Function Pre 21.38 %    Socioeconomic Pre 29 %    Psych/Spiritual Pre 30 %    Family Pre 27.6 %    GLOBAL Pre 25.41 %            Scores of 19 and below usually indicate a poorer quality of life in these areas.  A difference of  2-3 points is a clinically meaningful difference.  A difference of 2-3 points in the total score of the Quality of Life Index has been associated with significant improvement in overall quality of life, self-image, physical symptoms, and general health in studies assessing change in quality of  life.   PHQ-9: Review Flowsheet  More data exists      04/30/2023 04/03/2023 03/08/2023 01/18/2023 10/21/2020  Depression screen PHQ 2/9  Decreased Interest   0 0 0  Down, Depressed, Hopeless   0 0 0  PHQ - 2 Score   0 0 0  Altered sleeping   1 3 0  Tired, decreased energy   1 3 0  Change in appetite   0 0 0  Feeling bad or failure about yourself    0 0 0  Trouble concentrating   0 0 0  Moving slowly or fidgety/restless   0 0 0  Suicidal thoughts  - 0 0 0  PHQ-9 Score   2 6 0  Difficult doing work/chores - - Not difficult at all - Not difficult at all    Details       Information is confidential and restricted. Go to Review Flowsheets to unlock data.        Interpretation of Total Score  Total Score Depression Severity:  1-4 = Minimal depression, 5-9 = Mild depression, 10-14 = Moderate depression, 15-19 = Moderately  severe depression, 20-27 = Severe depression   Psychosocial Evaluation and Intervention:  Psychosocial Evaluation - 03/08/23 1352       Psychosocial Evaluation & Interventions   Interventions Stress management education;Relaxation education;Encouraged to exercise with the program and follow exercise prescription    Comments Patient has no psychosocial barriers or issues identified at his orientation visit. His PHQ-9 score was 2 due to lack of energy and trouble staying alseep at times. He denies any depression or anxiety. He has participated in cardiac rehab with [place] and is very familiar with our staff and the program. He is a retired Occupational hygienist. He has a great support system with his wife, 2 children and several grandchildren and he has a brother and several friends that also support him. He is very motivated to get back into an exercise routine and is ready to start the program.    Expected Outcomes Patient will have no psychosocial issues identified.    Continue Psychosocial Services  No Follow up required             Psychosocial Re-Evaluation:  Psychosocial  Re-Evaluation     Row Name 03/19/23 1313 04/15/23 1427 06/13/23 1612         Psychosocial Re-Evaluation   Current issues with None Identified None Identified None Identified     Comments Patient is new to PR program attending 2 session.  He was referred to PR by Dr. Tenny Craw with Chronic heart failure.  Patient initial QOL score is 25.41% and his PHQ-9 score is 2.  He is very pleasant and interactive with class and staff.  We will continue to monitor his progress as he works toward meeting these goals. Patient has completed 7 sessions.  He continues to have no psychosocial barriers identified. He sees a Veterinary surgeon routinely for adjustment disorder and depression.  He seems to enjoy the sessions. We will continue to monitor his progress. JP is doing well in rehab.  He denies any major stressors and feeling good mentally.  He usually sleeps well and gets about 5 hrs a night which he feels is good for him.  He feels like he is currently in a good place and is enjoying rehab and can tell that it is helping.     Expected Outcomes Patient will have no psychological issues identified at discharge Patient will have no psychological issues identified. Short: Continue to stay positive Long:Continue to exercise for mental boost     Interventions Stress management education;Relaxation education;Encouraged to attend Pulmonary Rehabilitation for the exercise Stress management education;Relaxation education;Encouraged to attend Pulmonary Rehabilitation for the exercise Encouraged to attend Pulmonary Rehabilitation for the exercise     Continue Psychosocial Services  No Follow up required No Follow up required Follow up required by staff              Psychosocial Discharge (Final Psychosocial Re-Evaluation):  Psychosocial Re-Evaluation - 06/13/23 1612       Psychosocial Re-Evaluation   Current issues with None Identified    Comments JP is doing well in rehab.  He denies any major stressors and feeling good  mentally.  He usually sleeps well and gets about 5 hrs a night which he feels is good for him.  He feels like he is currently in a good place and is enjoying rehab and can tell that it is helping.    Expected Outcomes Short: Continue to stay positive Long:Continue to exercise for mental boost    Interventions Encouraged to attend Pulmonary Rehabilitation for  the exercise    Continue Psychosocial Services  Follow up required by staff              Education: Education Goals: Education classes will be provided on a weekly basis, covering required topics. Participant will state understanding/return demonstration of topics presented.  Learning Barriers/Preferences:  Learning Barriers/Preferences - 03/08/23 1324       Learning Barriers/Preferences   Learning Barriers None    Learning Preferences Written Material             Education Topics: How Lungs Work and Diseases: - Discuss the anatomy of the lungs and diseases that can affect the lungs, such as COPD. Flowsheet Row PULMONARY REHAB OTHER RESPIRATORY from 06/13/2023 in [place] [facility]  Date 06/13/23  Educator [facility]  Instruction Review Code 1- Verbalizes Understanding       Exercise: -Discuss the importance of exercise, FITT principles of exercise, normal and abnormal responses to exercise, and how to exercise safely.   Environmental Irritants: -Discuss types of environmental irritants and how to limit exposure to environmental irritants. Flowsheet Row PULMONARY REHAB OTHER RESPIRATORY from 06/13/2023 in [place] [facility]  Date 04/18/23  Educator handout       Meds/Inhalers and oxygen: - Discuss respiratory medications, definition of an inhaler and oxygen, and the proper way to use an inhaler and oxygen. Flowsheet Row PULMONARY REHAB OTHER RESPIRATORY from 06/13/2023 in [place] [facility]  Date 04/25/23  Educator handout       Energy Saving Techniques: - Discuss  methods to conserve energy and decrease shortness of breath when performing activities of daily living.    Bronchial Hygiene / Breathing Techniques: - Discuss breathing mechanics, pursed-lip breathing technique,  proper posture, effective ways to clear airways, and other functional breathing techniques   Cleaning Equipment: - Provides group verbal and written instruction about the health risks of elevated stress, cause of high stress, and healthy ways to reduce stress.   Nutrition I: Fats: - Discuss the types of cholesterol, what cholesterol does to the body, and how cholesterol levels can be controlled. Flowsheet Row CARDIAC REHAB PHASE II EXERCISE from 10/05/2020 in [place] [facility]  Date 08/31/20  Educator [facility]  Instruction Review Code 2- Demonstrated Understanding       Nutrition II: Labels: -Discuss the different components of food labels and how to read food labels. Flowsheet Row CARDIAC REHAB PHASE II EXERCISE from 10/05/2020 in [place] [facility]  Date 09/07/20  Educator DF  Instruction Review Code 2- Demonstrated Understanding       Respiratory Infections: - Discuss the signs and symptoms of respiratory infections, ways to prevent respiratory infections, and the importance of seeking medical treatment when having a respiratory infection. Flowsheet Row PULMONARY REHAB OTHER RESPIRATORY from 06/13/2023 in [place] [facility]  Date 03/14/23  Educator handout       Stress I: Signs and Symptoms: - Discuss the causes of stress, how stress may lead to anxiety and depression, and ways to limit stress. Flowsheet Row PULMONARY REHAB OTHER RESPIRATORY from 06/13/2023 in [place] [facility]  Date 03/21/23  Educator handout       Stress II: Relaxation: -Discuss relaxation techniques to limit stress. Flowsheet Row PULMONARY REHAB OTHER RESPIRATORY from 06/13/2023 in [place] [facility]  Date 05/30/23   Educator HB  Instruction Review Code 1- Verbalizes Understanding       Oxygen for Home/Travel: - Discuss how to prepare for travel when on oxygen and proper  ways to transport and store oxygen to ensure safety. Flowsheet Row PULMONARY REHAB OTHER RESPIRATORY from 06/13/2023 in [place] [facility]  Date 04/04/23  Educator handout       Knowledge Questionnaire Score:  Knowledge Questionnaire Score - 03/08/23 1324       Knowledge Questionnaire Score   Pre Score 14/18             Core Components/Risk Factors/Patient Goals at Admission:  Personal Goals and Risk Factors at Admission - 03/08/23 1325       Core Components/Risk Factors/Patient Goals on Admission    Weight Management Obesity    Improve shortness of breath with ADL's Yes    Intervention Provide education, individualized exercise plan and daily activity instruction to help decrease symptoms of SOB with activities of daily living.    Expected Outcomes Short Term: Improve cardiorespiratory fitness to achieve a reduction of symptoms when performing ADLs;Long Term: Be able to perform more ADLs without symptoms or delay the onset of symptoms    Heart Failure Yes    Intervention Provide a combined exercise and nutrition program that is supplemented with education, support and counseling about heart failure. Directed toward relieving symptoms such as shortness of breath, decreased exercise tolerance, and extremity edema.    Expected Outcomes Short term: Attendance in program 2-3 days a week with increased exercise capacity. Reported lower sodium intake. Reported increased fruit and vegetable intake. Reports medication compliance.;Long term: Adoption of self-care skills and reduction of barriers for early signs and symptoms recognition and intervention leading to self-care maintenance.    Hypertension Yes    Intervention Provide education on lifestyle modifcations including regular physical activity/exercise, weight  management, moderate sodium restriction and increased consumption of fresh fruit, vegetables, and low fat dairy, alcohol moderation, and smoking cessation.;Monitor prescription use compliance.    Expected Outcomes Short Term: Continued assessment and intervention until BP is < 140/69mm HG in hypertensive participants. < 130/70mm HG in hypertensive participants with diabetes, heart failure or chronic kidney disease.;Long Term: Maintenance of blood pressure at goal levels.    Personal Goal Other Yes    Personal Goal Patient wants to improve his balance and wallking ability and decrease his SOB with activity.    Intervention Patient will attend PR 3 days/week with exercise and education.    Expected Outcomes Patient will complete the program meeting both personal and program goals.             Core Components/Risk Factors/Patient Goals Review:   Goals and Risk Factor Review     Row Name 03/19/23 1323 04/15/23 1429 06/13/23 1616         Core Components/Risk Factors/Patient Goals Review   Personal Goals Review Develop more efficient breathing techniques such as purse lipped breathing and diaphragmatic breathing and practicing self-pacing with activity.;Improve shortness of breath with ADL's;Heart Failure Develop more efficient breathing techniques such as purse lipped breathing and diaphragmatic breathing and practicing self-pacing with activity.;Improve shortness of breath with ADL's;Heart Failure Heart Failure;Improve shortness of breath with ADL's;Increase knowledge of respiratory medications and ability to use respiratory devices properly.;Weight Management/Obesity     Review Patient is new to PR program, attending 2 sessions.  He was referred to PR via Dr. Sherene Sires with chronic heart failure.  His personal goals is to imporve his balance and walking ability and decrease SOB with activity.  He tolerates exercise well on treadmill and nu-step with his O2 sats at 94%-95% and no supplemental oxygen  needed at this time.  We will encourage him to increase his workload as he progresses in the program.   We will continue to monitor his progress as he works toward meeting these goals. Patient has completed 7 sessions. He is doing well in the program with consistent attendance. He is exercising on RA with his O2 sats averaging 96%.  His personal goals is to imporve his balance and walking ability and decrease SOB with activity. We will continue to monitor his progress as he works toward meeting these goals. JP is doing well in rehab.  His pressures are doing well and he checks them daily. He also weighs every morning and weight is coming down.  His breathing is getting better and the PLB is helping.   He feels that his Trellegy is really making a difference and he has not needed to use his albuterol inhaler recently.     Expected Outcomes Patient will complete the program meeting both program and personal goals at discharge. Patient will complete the program meeting both personal and program goals. short: Continue to work on weight loss long: contineu to monitor risk factors              Core Components/Risk Factors/Patient Goals at Discharge (Final Review):   Goals and Risk Factor Review - 06/13/23 1616       Core Components/Risk Factors/Patient Goals Review   Personal Goals Review Heart Failure;Improve shortness of breath with ADL's;Increase knowledge of respiratory medications and ability to use respiratory devices properly.;Weight Management/Obesity    Review JP is doing well in rehab.  His pressures are doing well and he checks them daily. He also weighs every morning and weight is coming down.  His breathing is getting better and the PLB is helping.   He feels that his Trellegy is really making a difference and he has not needed to use his albuterol inhaler recently.    Expected Outcomes short: Continue to work on weight loss long: contineu to monitor risk factors             ITP  Comments:  ITP Comments     Row Name 04/24/23 1614 05/08/23 0741 05/16/23 1529 05/22/23 1520 06/19/23 1527   ITP Comments 30 day review completed. ITP sent to Dr.Jehanzeb Memon, Medical Director of  Pulmonary Rehab. Continue with ITP unless changes are made by physician. Pt tested positive for COVID 05/04/23 will return 10days after postiive test to rehab without symptoms JP called to let us know that he tested postive for COVID again.  He was told by his doctor that this could happen. He also continues to have a cough.  He was encouraged to stay home until symptoms resolve.  He plans to check in next week.  Since being out, we have not reviewed goals this round. 30 day review completed. ITP sent to Dr.Jehanzeb Memon, Medical Director of  Pulmonary Rehab. Continue with ITP unless changes are made by physician. 30 day review completed. ITP sent to Dr.Jehanzeb Memon, Medical Director of  Pulmonary Rehab. Continue with ITP unless changes are made by physician.    Row Name 07/09/23 1415           ITP Comments Pt is currently admitted for stroke and will need PT prior to returning to pulmonary rehab.  We will discharge at this time.                Comments: Discharge ITP

## 2023-07-10 DIAGNOSIS — I63211 Cerebral infarction due to unspecified occlusion or stenosis of right vertebral arteries: Secondary | ICD-10-CM | POA: Diagnosis not present

## 2023-07-10 MED ORDER — BISACODYL 10 MG RE SUPP
10.0000 mg | Freq: Every day | RECTAL | Status: DC | PRN
  Administered 2023-07-10: 10 mg via RECTAL
  Filled 2023-07-10: qty 1

## 2023-07-10 NOTE — Progress Notes (Signed)
Inpatient Rehab Admissions Coordinator:    I continue to await insurance auth for CIR.   Kishon Garriga, MS, CCC-SLP Rehab Admissions Coordinator  [phone] (celll) [phone] (office)  

## 2023-07-10 NOTE — Progress Notes (Signed)
Physical Therapy Treatment Patient Details Name: Jeremy Lopez MRN: [id number] DOB: 1951/09/01 Today's Date: 07/10/2023   History of Present Illness Pt is a 72 y.o. male presenting 9/27 with R weakness and dysequilibrium. MRI brain revealed acute/early subacute infarct of the medial inferior right cerebellum. CAD, EtOh use, HTN, HLD.    PT Comments  Pt first encountered sitting in a hallway chair with nursing staff. He was ambulating and became dizzy, nauseous, and diaphoretic (BP was 156/70 HR 58). I assumed pt care at this point and assist with transfer and gait back to room. Upon standing BP was 112/73 HR 59, and again after short distance of ambulation into room while standing 113/87 HR 60. Pt required up to Mod assist for gait, still leaning towards Rt. Cues for awareness and techniques to correct. Denied further instances of dizziness but did appear pale and diaphoretic, improving only once he return to bed. Denies pain or alteration in other senses. No changes in focal deficits. RN notified of BP once back in room. Patient will continue to benefit from skilled physical therapy services to further improve independence with functional mobility.     If plan is discharge home, recommend the following: A lot of help with walking and/or transfers;A little help with bathing/dressing/bathroom;Assistance with cooking/housework;Assist for transportation     Equipment Recommendations   (TBD next venue)    Recommendations for Other Services Rehab consult     Precautions / Restrictions Precautions Precautions: Fall Restrictions Weight Bearing Restrictions: No     Mobility  Bed Mobility Overal bed mobility: Needs Assistance Bed Mobility: Sit to Supine     Supine to sit: Contact guard Sit to supine: Supervision   General bed mobility comments: Supervision for safety with Rt lean while seated EOB, able to bring LEs into bed without assistance.    Transfers Overall transfer level: Needs  assistance Equipment used: Rolling walker (2 wheels) Transfers: Sit to/from Stand Sit to Stand: Min assist           General transfer comment: Min assist with moderate lean towards Rt requiring assist to correct while rising. VC to remind pt where to place hands prior to rising.    Ambulation/Gait Ambulation/Gait assistance: Mod assist Gait Distance (Feet): 22 Feet Assistive device: Rolling walker (2 wheels) Gait Pattern/deviations: Step-through pattern, Decreased step length - right, Decreased stance time - right, Decreased stride length, Decreased dorsiflexion - right, Decreased weight shift to left, Ataxic, Scissoring, Staggering right, Drifts right/left, Narrow base of support, Knees buckling Gait velocity: decr Gait velocity interpretation: <1.31 ft/sec, indicative of household ambulator   General Gait Details: Moderate lean towards the right requiring up to mod assist at times for balance and walker control. VC for midline awareness, anticipating drift and corrective techniques. Cues for larger Lt step to avoid stumbling forward. Heavy reliance on RW for support.   Stairs             Wheelchair Mobility     Tilt Bed    Modified Rankin (Stroke Patients Only) Modified Rankin (Stroke Patients Only) Pre-Morbid Rankin Score: No symptoms Modified Rankin: Moderately severe disability     Balance Overall balance assessment: Needs assistance Sitting-balance support: No upper extremity supported, Feet supported Sitting balance-Leahy Scale: Fair   Postural control: Right lateral lean (leaning Rt when found sitting in chair. Stabilizes himself with good correction while EOB.) Standing balance support: Bilateral upper extremity supported Standing balance-Leahy Scale: Poor Standing balance comment: reliant on RW; leans Rt, able to correct with  VC.                            Cognition Arousal: Alert Behavior During Therapy: WFL for tasks  assessed/performed Overall Cognitive Status: Within Functional Limits for tasks assessed                                          Exercises Other Exercises Other Exercises: Standing balance, lateral leans and midline orientation    General Comments General comments (skin integrity, edema, etc.): Diaphoretic in chair, sitting in hallway with nursing staff. Vitals taken and recorded by staff.      Pertinent Vitals/Pain Pain Assessment Pain Assessment: No/denies pain Pain Intervention(s): Monitored during session    Home Living                          Prior Function            PT Goals (current goals can now be found in the care plan section) Acute Rehab PT Goals Patient Stated Goal: Get well PT Goal Formulation: With patient/family Time For Goal Achievement: 07/19/23 Potential to Achieve Goals: Good Progress towards PT goals: Progressing toward goals    Frequency    Min 1X/week      PT Plan      Co-evaluation              AM-PAC PT "6 Clicks" Mobility   Outcome Measure  Help needed turning from your back to your side while in a flat bed without using bedrails?: A Little Help needed moving from lying on your back to sitting on the side of a flat bed without using bedrails?: A Little Help needed moving to and from a bed to a chair (including a wheelchair)?: A Little Help needed standing up from a chair using your arms (e.g., wheelchair or bedside chair)?: A Little Help needed to walk in hospital room?: A Lot Help needed climbing 3-5 steps with a railing? : A Lot 6 Click Score: 16    End of Session Equipment Utilized During Treatment: Gait belt Activity Tolerance: Treatment limited secondary to medical complications (Comment) (dizzy, diaphoretic) Patient left: with call bell/phone within reach;in bed;with bed alarm set Nurse Communication: Mobility status (BP) PT Visit Diagnosis: Unsteadiness on feet (R26.81);Other  abnormalities of gait and mobility (R26.89);Other symptoms and signs involving the nervous system (R29.898);Hemiplegia and hemiparesis Hemiplegia - Right/Left: Right Hemiplegia - caused by: Cerebral infarction     Time: 1238-1249 PT Time Calculation (min) (ACUTE ONLY): 11 min  Charges:    $Gait Training: 8-22 mins PT General Charges $$ ACUTE PT VISIT: 1 Visit                     [name], PT, DPT [facility]  Rehabilitation Services Physical Therapist Office: [phone] Website: [email or web address]    [name] 07/10/2023, 1:21 PM

## 2023-07-10 NOTE — Plan of Care (Signed)

## 2023-07-10 NOTE — Progress Notes (Signed)
PROGRESS NOTE    Jeremy Lopez  WUJ:[id number] DOB: 23-Feb-1951 DOA: 07/05/2023 PCP: [name], MD  Chief Complaint  Patient presents with   Weakness    Brief Narrative:   Jeremy Lopez is Jeremy Lopez 72 y.o. male with past medical history significant for essential hypertension, CAD s/p PCI/DES, HLD, major depressive disorder, Hx lung cancer s/p chemotherapy/radiation, asthmatic bronchitis, EtOH use, former tobacco abuse who presented to [facility] ED on 9/27 via EMS for right-sided weakness, headache, disequilibrium.  Also endorsed paresthesias to right side of face and bilateral upper extremities that subsided in 10 minutes.  Initial symptoms onset at 4 AM in which he was feeling fine at 2 AM.  Given patient having significant trouble getting in downstairs, EMS was activated and patient was brought to ED for further evaluation.   In the ED, temperature 97.8 F, HR 75, RR 14, BP 158/74, SpO2 94% on room air.  WBC 10.8, hemoglobin 14.6, platelet count 339.  Sodium 134, potassium 3.6, chloride 101, CO2 22, glucose 110, BUN 16, creat 1.06.  AST 12, ALT 15, total bili Ruben 0.8.  Total cholesterol 107, LDL 38, HDL 48.  Urinalysis unrevealing.  UDS negative.  EtOH level less than 10.  CT head without contrast with chronic ventriculomegaly, stable since recent MRI.  CT angiogram head/neck with no large vessel occlusion.  MRI brain without contrast with early subacute versus acute infarct medial inferior right cerebellum.  EKG with NSR.  Neurology was consulted, recommended transfer to [facility] for continued stroke evaluation.  TRH consulted for admission and patient was transferred to [facility].  Assessment & Plan:   Principal Problem:   CVA (cerebral vascular accident) [facility]) Active Problems:   Mixed hyperlipidemia   Essential hypertension   CAD (coronary artery disease), native coronary artery   Headache   Cerebellar stroke, acute (HCC)  Acute ischemic cerebellar  CVA Patient presenting to ED with disequilibrium, right sided weakness with paresthesias.  Last known normal 2 AM on 07/06/2023.  CT head with noted chronic ventriculomegaly.  CT angiogram head/neck with no large vessel occlusion.  MRI brain without contrast with acute versus early subacute infarct medial inferior right cerebellum.  LDL 34, hemoglobin A1c 5.7.  TTE with LVEF 60 to 65%, mild LVH, grade 1 diastolic dysfunction, IVC normal in size.  Seen by neurology with recommendation of aspirin and Plavix x 3 weeks followed by aspirin alone.  Seen by PT/OT with recommendation of CIR placement.  Outpatient follow-up with neurology 3-4 weeks. -- Pending CIR placement; medically stable for discharge once bed available and receives insurance authorization   Concern for normal pressure hydrocephalus CT head with chronic ventriculomegaly, stable since recent MRI.  Symptoms have persisted over the last 11 months, currently following with neurology, Dr. Marjory Lies outpatient.  Was scheduled for an LP this upcoming week but will have to postpone given new stroke diagnosis. -- Recommend LP reschedule after 3 weeks of DAPT.   Essential hypertension Home regimen includes diltiazem 240 mg p.o. daily, metoprolol succinate 100 mg p.o. daily. -- home metoprolol succinate 100 mg p.o. daily today -- Diltiazem 240 mg p.o. daily; start tomorrow   CAD s/p PCI/DES Follows with cardiology outpatient.  Continue aspirin and statin.   Hyperlipidemia Total cholesterol 107, LDL 38, HDL 48. -- Continue home Crestor 40 mg p.o. daily   Major depressive disorder -- Lexapro 10 mg p.o. daily   History of lung cancer s/p chemotherapy/radiation Follows with pulmonology outpatient, continue  outpatient pulmonary rehab.   Asthmatic bronchitis Former tobacco use disorder Follow with pulmonology outpatient, Dr. Tonia Brooms.  On Trelegy Ellipta outpatient. -- Continue Breo Ellipta and Incruse Ellipta as hospital substitution 1 puff  daily   EtOH use disorder Counseled on need for complete cessation/abstinence.   Obese Body mass index is 30.87 kg/m.     DVT prophylaxis: lovenox Code Status: full Family Communication: wife at bedside Disposition:   Status is: Inpatient Remains inpatient appropriate because: awaiting CIR   Consultants:  neurology  Procedures:   Echo IMPRESSIONS     1. Left ventricular ejection fraction, by estimation, is 60 to 65%. The  left ventricle has normal function. The left ventricle has no regional  wall motion abnormalities. There is mild left ventricular hypertrophy.  Left ventricular diastolic parameters  are consistent with Grade I diastolic dysfunction (impaired relaxation).   2. Right ventricular systolic function is normal. The right ventricular  size is normal. Tricuspid regurgitation signal is inadequate for assessing  PA pressure.   3. The mitral valve is normal in structure. No evidence of mitral valve  regurgitation.   4. The aortic valve is tricuspid. Aortic valve regurgitation is not  visualized. Aortic valve sclerosis is present, with no evidence of aortic  valve stenosis.   5. The inferior vena cava is normal in size with greater than 50%  respiratory variability, suggesting right atrial pressure of 3 mmHg.    Antimicrobials:  Anti-infectives (From admission, onward)    None       Subjective: No complaints  Objective: Vitals:   07/10/23 0837 07/10/23 0955 07/10/23 1108 07/10/23 1511  BP:  136/64 129/62 132/67  Pulse:   69 70  Resp:   17 17  Temp:   98.4 F (36.9 C) 98 F (36.7 C)  TempSrc:   Oral Oral  SpO2: 94%  94% 93%  Weight:      Height:        Intake/Output Summary (Last 24 hours) at 07/10/2023 1731 Last data filed at 07/10/2023 0725 Gross per 24 hour  Intake --  Output 1500 ml  Net -1500 ml   Filed Weights   07/05/23 1507 07/05/23 2330  Weight: 97.7 kg 97.6 kg    Examination:  General exam: Appears calm and  comfortable  Respiratory system: unlabored Cardiovascular system: RRR Central nervous system: slowed FNF on R Extremities: no LEE    Data Reviewed: I have personally reviewed following labs and imaging studies  CBC: Recent Labs  Lab 07/05/23 1543 07/06/23 0602  WBC 10.8* 9.4  NEUTROABS 9.2*  --   HGB 14.6 14.2  HCT 43.8 42.7  MCV 91.6 92.8  PLT 339 298    Basic Metabolic Panel: Recent Labs  Lab 07/05/23 1543 07/06/23 0602  NA 134* 134*  K 3.6 3.6  CL 101 99  CO2 22 25  GLUCOSE 110* 95  BUN 16 18  CREATININE 1.06 1.36*  CALCIUM 8.9 8.5*    GFR: Estimated Creatinine Clearance: 57.5 mL/min (Laneisha Mino) (by C-G formula based on SCr of 1.36 mg/dL (H)).  Liver Function Tests: Recent Labs  Lab 07/05/23 1543 07/06/23 0602  AST 12* 11*  ALT 15 13  ALKPHOS 69 61  BILITOT 0.8 0.6  PROT 6.8 6.0*  ALBUMIN 3.8 3.3*    CBG: No results for input(s): "GLUCAP" in the last 168 hours.   No results found for this or any previous visit (from the past 240 hour(s)).       Radiology Studies: No  results found.      Scheduled Meds:  aspirin EC  81 mg Oral Daily   clopidogrel  75 mg Oral Daily   diltiazem  240 mg Oral Daily   enoxaparin (LOVENOX) injection  40 mg Subcutaneous Q24H   escitalopram  10 mg Oral Daily   fluticasone furoate-vilanterol  1 puff Inhalation Daily   And   umeclidinium bromide  1 puff Inhalation Daily   gabapentin  300 mg Oral QHS   loratadine  10 mg Oral Daily   metoprolol succinate  100 mg Oral Daily   rosuvastatin  40 mg Oral Daily   traZODone  50 mg Oral QHS   Continuous Infusions:   LOS: 3 days    Time spent: over 30 min    Lacretia Nicks, MD [facility]   To contact the attending provider between 7A-7P or the covering provider during after hours 7P-7A, please log into the web site [email or web address] and access using universal [place] password for that web site. If you do not have the password, please call the hospital  operator.  07/10/2023, 5:31 PM

## 2023-07-10 NOTE — Care Management Important Message (Signed)
Important Message  Patient Details  Name: Jeremy Lopez MRN: [id number] Date of Birth: November 15, 1950   Important Message Given:  Yes - Medicare IM     Dorena Bodo 07/10/2023, 2:10 PM

## 2023-07-11 ENCOUNTER — Encounter (HOSPITAL_COMMUNITY): Payer: HMO

## 2023-07-11 DIAGNOSIS — I63211 Cerebral infarction due to unspecified occlusion or stenosis of right vertebral arteries: Secondary | ICD-10-CM | POA: Diagnosis not present

## 2023-07-11 NOTE — TOC Progression Note (Signed)
Transition of Care [facility]) - Progression Note    Patient Details  Name: Jeremy Lopez MRN: [id number] Date of Birth: [birth year]  Transition of [facility]) CM/SW Contact  Kermit Balo, RN Phone Number: 07/11/2023, 1:36 PM  Clinical Narrative:     Pt has insurance approval for CIR. Will d/c to CIR when bed available.  TOC following.  Expected Discharge Plan: IP Rehab Facility Barriers to Discharge: Continued Medical Work up  Expected Discharge Plan and Services   Discharge Planning Services: CM Consult Post Acute Care Choice: IP Rehab Living arrangements for the past 2 months: Single Family Home                                       Social Determinants of Health (SDOH) Interventions SDOH Screenings   Food Insecurity: No Food Insecurity (07/05/2023)  Housing: Low Risk  (07/05/2023)  Transportation Needs: No Transportation Needs (07/05/2023)  Utilities: Not At Risk (07/05/2023)  Alcohol Screen: Medium Risk (04/03/2023)  Depression (PHQ2-9): Medium Risk (04/30/2023)  Tobacco Use: Medium Risk (07/05/2023)    Readmission Risk Interventions     No data to display

## 2023-07-11 NOTE — Progress Notes (Signed)
Patient ambulated from room to nurse's station then back to room. Had some ataxia and dizziness during ambulation. R-side is weak.

## 2023-07-11 NOTE — Progress Notes (Signed)
Occupational Therapy Treatment Patient Details Name: Jeremy Lopez MRN: [id number] DOB: [birth year] Today's Date: 07/11/2023   History of present illness Pt is a 72 y.o. male presenting 9/27 with R weakness and dysequilibrium. MRI brain revealed acute/early subacute infarct of the medial inferior right cerebellum. CAD, EtOh use, HTN, HLD.   OT comments  Pt is making good progress towards their acute OT goals. Upon arrival, pt expressed concern that he felt like his memory, specifically short term, has been affect by the CVA. Short Blessed Assessment completed with only 1 error in delayed recall. Moments of forgetfulness noted throughout session where pt would suddenly change/forget current topic, however he would self correct when he did remember what he was attempting to say several minutes later. Reviewed journal/calendar technique as a memory aide, and also to be used to assess his STM in the next therapy session. He was agreeable to start keeping notes on daily tasks and education this date, pen and paper provided. Pt is L handed (unaffected), however his RUE was [facility] for all basic tasks with only mild weakness, pt reports baseline. OT to continue to follow acutely to facilitate progress towards established goals. Plan to reassess cognition with self-kept journal and LB ADLs next date. Pt will continue to benefit from intensive inpatient follow up therapy, >3 hours/day after discharge.       If plan is discharge home, recommend the following:  A lot of help with walking and/or transfers;A lot of help with bathing/dressing/bathroom;Assistance with cooking/housework;Direct supervision/assist for medications management;Direct supervision/assist for financial management;Assist for transportation;Help with stairs or ramp for entrance   Equipment Recommendations  Other (comment)    Recommendations for Other Services Rehab consult    Precautions / Restrictions Precautions Precautions:  Fall Restrictions Weight Bearing Restrictions: No              ADL either performed or assessed with clinical judgement   ADL Overall ADL's : Needs assistance/impaired               General ADL Comments: session focused on cognition    Extremity/Trunk Assessment Upper Extremity Assessment Upper Extremity Assessment: Generalized weakness RUE Deficits / Details: generally weaker than L, also pt's non-dom hand so could be baseline for him. Coordination and dexterity were [facility]   Lower Extremity Assessment Lower Extremity Assessment: Defer to PT evaluation        Vision   Vision Assessment?: No apparent visual deficits   Perception Perception Perception: Not tested   Praxis Praxis Praxis: Not tested    Cognition Arousal: Alert Behavior During Therapy: WFL for tasks assessed/performed Overall Cognitive Status: Impaired/Different from baseline Area of Impairment: Memory           Memory: Decreased short-term memory         General Comments: pt expressed concern of his STM. Short blessed assessment complted with only 1 error in delayed recall. Good problem solving, attention and processing speed noted. Educated pt on use of a daily journal to aide in memory, as well as to use as a testing tool to assess memory. pt agreeable to try caldendar/journal method.              General Comments VSS, BP not assessed. Reviewed importance of symptom management and awareness especially when getting up for ADLs with staff    Pertinent Vitals/ Pain       Pain Assessment Pain Assessment: No/denies pain         Frequency  Min 1X/week  Progress Toward Goals  OT Goals(current goals can now be found in the care plan section)     Acute Rehab OT Goals Patient Stated Goal: to go to rehab OT Goal Formulation: With patient Time For Goal Achievement: 07/20/23 Potential to Achieve Goals: Good ADL Goals Pt Will Perform Grooming: with supervision;standing Pt  Will Perform Lower Body Dressing: with supervision;sit to/from stand Pt Will Transfer to Toilet: with supervision;ambulating;regular height toilet   AM-PAC OT "6 Clicks" Daily Activity     Outcome Measure   Help from another person eating meals?: None Help from another person taking care of personal grooming?: A Little Help from another person toileting, which includes using toliet, bedpan, or urinal?: A Lot Help from another person bathing (including washing, rinsing, drying)?: A Lot Help from another person to put on and taking off regular upper body clothing?: A Little Help from another person to put on and taking off regular lower body clothing?: A Lot 6 Click Score: 16    End of Session    OT Visit Diagnosis: Unsteadiness on feet (R26.81);Muscle weakness (generalized) (M62.81);Other abnormalities of gait and mobility (R26.89)   Activity Tolerance Patient tolerated treatment well   Patient Left in chair;with call bell/phone within reach;with chair alarm set;with family/visitor present   Nurse Communication Mobility status        Time: 4098-1191 OT Time Calculation (min): 24 min  Charges: OT General Charges $OT Visit: 1 Visit OT Treatments $Therapeutic Activity: 23-37 mins  Derenda Mis, OTR/L Acute Rehabilitation Services Office [phone] Secure Chat Communication Preferred   Donia Pounds 07/11/2023, 3:41 PM

## 2023-07-11 NOTE — Progress Notes (Signed)
PROGRESS NOTE    [name]  RUE:[id number] DOB: 04-Nov-1950 DOA: 07/05/2023 PCP: [name], MD  Chief Complaint  Patient presents with   Weakness    Brief Narrative:   Jeremy Lopez is Jeremy Lopez 72 y.o. male with past medical history significant for essential hypertension, CAD s/p PCI/DES, HLD, major depressive disorder, Hx lung cancer s/p chemotherapy/radiation, asthmatic bronchitis, EtOH use, former tobacco abuse who presented to [facility] ED on 9/27 via EMS for right-sided weakness, headache, disequilibrium.  Also endorsed paresthesias to right side of face and bilateral upper extremities that subsided in 10 minutes.  Initial symptoms onset at 4 AM in which he was feeling fine at 2 AM.  Given patient having significant trouble getting in downstairs, EMS was activated and patient was brought to ED for further evaluation.   In the ED, temperature 97.8 F, HR 75, RR 14, BP 158/74, SpO2 94% on room air.  WBC 10.8, hemoglobin 14.6, platelet count 339.  Sodium 134, potassium 3.6, chloride 101, CO2 22, glucose 110, BUN 16, creat 1.06.  AST 12, ALT 15, total bili Jeremy Lopez 0.8.  Total cholesterol 107, LDL 38, HDL 48.  Urinalysis unrevealing.  UDS negative.  EtOH level less than 10.  CT head without contrast with chronic ventriculomegaly, stable since recent MRI.  CT angiogram head/neck with no large vessel occlusion.  MRI brain without contrast with early subacute versus acute infarct medial inferior right cerebellum.  EKG with NSR.  Neurology was consulted, recommended transfer to [facility] for continued stroke evaluation.  TRH consulted for admission and patient was transferred to [facility].  Assessment & Plan:   Principal Problem:   CVA (cerebral vascular accident) [facility]) Active Problems:   Mixed hyperlipidemia   Essential hypertension   CAD (coronary artery disease), native coronary artery   Headache   Cerebellar stroke, acute (HCC)  Acute ischemic cerebellar  CVA Patient presenting to ED with disequilibrium, right sided weakness with paresthesias.  Last known normal 2 AM on 07/06/2023.  CT head with noted chronic ventriculomegaly.  CT angiogram head/neck with no large vessel occlusion.  MRI brain without contrast with acute versus early subacute infarct medial inferior right cerebellum.  LDL 34, hemoglobin A1c 5.7.  TTE with LVEF 60 to 65%, mild LVH, grade 1 diastolic dysfunction, IVC normal in size.  Seen by neurology with recommendation of aspirin and Plavix x 3 weeks followed by aspirin alone.  Seen by PT/OT with recommendation of CIR placement.  Outpatient follow-up with neurology 3-4 weeks. -- Pending CIR placement; medically stable for discharge once bed available and receives insurance authorization   Orthostasis Will check, not clear if he was symptomatic - noted drop in SBP after working with therapy.  Will follow orthostatics and adjust plan as indicated.  Concern for normal pressure hydrocephalus CT head with chronic ventriculomegaly, stable since recent MRI.  Symptoms have persisted over the last 11 months, currently following with neurology, Dr. Marjory Lies outpatient.  Was scheduled for an LP this upcoming week but will have to postpone given new stroke diagnosis. -- Recommend LP reschedule after 3 weeks of DAPT.   Essential hypertension Home regimen includes diltiazem 240 mg p.o. daily, metoprolol succinate 100 mg p.o. daily. -- home metoprolol succinate 100 mg p.o. daily today -- Diltiazem 240 mg p.o. daily; start tomorrow   CAD s/p PCI/DES Follows with cardiology outpatient.  Continue aspirin and statin.   Hyperlipidemia Total cholesterol 107, LDL 38, HDL 48. -- Continue home Crestor  40 mg p.o. daily   Major depressive disorder -- Lexapro 10 mg p.o. daily   History of lung cancer s/p chemotherapy/radiation Follows with pulmonology outpatient, continue outpatient pulmonary rehab.   Asthmatic bronchitis Former tobacco use  disorder Follow with pulmonology outpatient, Dr. Tonia Brooms.  On Trelegy Ellipta outpatient. -- Continue Breo Ellipta and Incruse Ellipta as hospital substitution 1 puff daily   EtOH use disorder Counseled on need for complete cessation/abstinence.   Obese Body mass index is 30.87 kg/m.     DVT prophylaxis: lovenox Code Status: full Family Communication: wife at bedside Disposition:   Status is: Inpatient Remains inpatient appropriate because: awaiting CIR   Consultants:  neurology  Procedures:   Echo IMPRESSIONS     1. Left ventricular ejection fraction, by estimation, is 60 to 65%. The  left ventricle has normal function. The left ventricle has no regional  wall motion abnormalities. There is mild left ventricular hypertrophy.  Left ventricular diastolic parameters  are consistent with Grade I diastolic dysfunction (impaired relaxation).   2. Right ventricular systolic function is normal. The right ventricular  size is normal. Tricuspid regurgitation signal is inadequate for assessing  PA pressure.   3. The mitral valve is normal in structure. No evidence of mitral valve  regurgitation.   4. The aortic valve is tricuspid. Aortic valve regurgitation is not  visualized. Aortic valve sclerosis is present, with no evidence of aortic  valve stenosis.   5. The inferior vena cava is normal in size with greater than 50%  respiratory variability, suggesting right atrial pressure of 3 mmHg.    Antimicrobials:  Anti-infectives (From admission, onward)    None       Subjective: Worried about drop in BP with standing Not symptomatic with this today, but asking about it  Objective: Vitals:   07/11/23 0810 07/11/23 0820 07/11/23 1220 07/11/23 1523  BP: 131/66  122/68 126/64  Pulse: 62  69 66  Resp: 18  19 19   Temp: 98.2 F (36.8 C)  98 F (36.7 C) 98.3 F (36.8 C)  TempSrc: Oral  Oral Oral  SpO2: 93% 93% 96% 96%  Weight:      Height:        Intake/Output  Summary (Last 24 hours) at 07/11/2023 1609 Last data filed at 07/11/2023 0432 Gross per 24 hour  Intake 240 ml  Output 525 ml  Net -285 ml   Filed Weights   07/05/23 1507 07/05/23 2330  Weight: 97.7 kg 97.6 kg    Examination:  General: No acute distress. Lungs: unlabored Neurological: Alert and oriented 3. Moves all extremities 4 Extremities: No clubbing or cyanosis. No edema.   Data Reviewed: I have personally reviewed following labs and imaging studies  CBC: Recent Labs  Lab 07/05/23 1543 07/06/23 0602  WBC 10.8* 9.4  NEUTROABS 9.2*  --   HGB 14.6 14.2  HCT 43.8 42.7  MCV 91.6 92.8  PLT 339 298    Basic Metabolic Panel: Recent Labs  Lab 07/05/23 1543 07/06/23 0602  NA 134* 134*  K 3.6 3.6  CL 101 99  CO2 22 25  GLUCOSE 110* 95  BUN 16 18  CREATININE 1.06 1.36*  CALCIUM 8.9 8.5*    GFR: Estimated Creatinine Clearance: 57.5 mL/min (Kassidie Hendriks) (by C-G formula based on SCr of 1.36 mg/dL (H)).  Liver Function Tests: Recent Labs  Lab 07/05/23 1543 07/06/23 0602  AST 12* 11*  ALT 15 13  ALKPHOS 69 61  BILITOT 0.8 0.6  PROT 6.8  6.0*  ALBUMIN 3.8 3.3*    CBG: No results for input(s): "GLUCAP" in the last 168 hours.   No results found for this or any previous visit (from the past 240 hour(s)).       Radiology Studies: No results found.      Scheduled Meds:  aspirin EC  81 mg Oral Daily   clopidogrel  75 mg Oral Daily   diltiazem  240 mg Oral Daily   enoxaparin (LOVENOX) injection  40 mg Subcutaneous Q24H   escitalopram  10 mg Oral Daily   fluticasone furoate-vilanterol  1 puff Inhalation Daily   And   umeclidinium bromide  1 puff Inhalation Daily   gabapentin  300 mg Oral QHS   loratadine  10 mg Oral Daily   metoprolol succinate  100 mg Oral Daily   rosuvastatin  40 mg Oral Daily   traZODone  50 mg Oral QHS   Continuous Infusions:   LOS: 4 days    Time spent: over 30 min    Lacretia Nicks, MD [facility]   To  contact the attending provider between 7A-7P or the covering provider during after hours 7P-7A, please log into the web site [email or web address] and access using universal [place] password for that web site. If you do not have the password, please call the hospital operator.  07/11/2023, 4:09 PM

## 2023-07-11 NOTE — Plan of Care (Signed)
Problem: Education: Goal: Knowledge of General Education information will improve Description: Including pain rating scale, medication(s)/side effects and non-pharmacologic comfort measures Outcome: Progressing   Problem: Health Behavior/Discharge Planning: Goal: Ability to manage health-related needs will improve Outcome: Progressing   Problem: Clinical Measurements: Goal: Ability to maintain clinical measurements within normal limits will improve Outcome: Progressing Goal: Will remain free from infection Outcome: Progressing Goal: Diagnostic test results will improve Outcome: Progressing Goal: Respiratory complications will improve Outcome: Progressing Goal: Cardiovascular complication will be avoided Outcome: Progressing   Problem: Activity: Goal: Risk for activity intolerance will decrease Outcome: Progressing   Problem: Nutrition: Goal: Adequate nutrition will be maintained Outcome: Progressing   Problem: Coping: Goal: Level of anxiety will decrease Outcome: Progressing   Problem: Elimination: Goal: Will not experience complications related to bowel motility Outcome: Progressing Goal: Will not experience complications related to urinary retention Outcome: Progressing   Problem: Pain Managment: Goal: General experience of comfort will improve Outcome: Progressing   Problem: Safety: Goal: Ability to remain free from injury will improve Outcome: Progressing   Problem: Skin Integrity: Goal: Risk for impaired skin integrity will decrease Outcome: Progressing   Problem: Education: Goal: Knowledge of disease or condition will improve Outcome: Progressing Goal: Knowledge of secondary prevention will improve (MUST DOCUMENT ALL) Outcome: Progressing Goal: Knowledge of patient specific risk factors will improve Loraine Leriche N/A or DELETE if not current risk factor) Outcome: Progressing   Problem: Ischemic Stroke/TIA Tissue Perfusion: Goal: Complications of ischemic  stroke/TIA will be minimized Outcome: Progressing   Problem: Coping: Goal: Will verbalize positive feelings about self Outcome: Progressing Goal: Will identify appropriate support needs Outcome: Progressing   Problem: Health Behavior/Discharge Planning: Goal: Ability to manage health-related needs will improve Outcome: Progressing Goal: Goals will be collaboratively established with patient/family Outcome: Progressing   Problem: Self-Care: Goal: Ability to participate in self-care as condition permits will improve Outcome: Progressing Goal: Verbalization of feelings and concerns over difficulty with self-care will improve Outcome: Progressing Goal: Ability to communicate needs accurately will improve Outcome: Progressing   Problem: Nutrition: Goal: Risk of aspiration will decrease Outcome: Progressing Goal: Dietary intake will improve Outcome: Progressing   Problem: Education: Goal: Knowledge of General Education information will improve Description: Including pain rating scale, medication(s)/side effects and non-pharmacologic comfort measures Outcome: Progressing   Problem: Health Behavior/Discharge Planning: Goal: Ability to manage health-related needs will improve Outcome: Progressing   Problem: Clinical Measurements: Goal: Ability to maintain clinical measurements within normal limits will improve Outcome: Progressing Goal: Will remain free from infection Outcome: Progressing Goal: Diagnostic test results will improve Outcome: Progressing Goal: Respiratory complications will improve Outcome: Progressing Goal: Cardiovascular complication will be avoided Outcome: Progressing   Problem: Activity: Goal: Risk for activity intolerance will decrease Outcome: Progressing   Problem: Nutrition: Goal: Adequate nutrition will be maintained Outcome: Progressing   Problem: Coping: Goal: Level of anxiety will decrease Outcome: Progressing   Problem:  Elimination: Goal: Will not experience complications related to bowel motility Outcome: Progressing Goal: Will not experience complications related to urinary retention Outcome: Progressing   Problem: Pain Managment: Goal: General experience of comfort will improve Outcome: Progressing   Problem: Safety: Goal: Ability to remain free from injury will improve Outcome: Progressing   Problem: Skin Integrity: Goal: Risk for impaired skin integrity will decrease Outcome: Progressing   Problem: Education: Goal: Knowledge of disease or condition will improve Outcome: Progressing Goal: Knowledge of secondary prevention will improve (MUST DOCUMENT ALL) Outcome: Progressing Goal: Knowledge of patient specific risk factors will improve Loraine Leriche  N/A or DELETE if not current risk factor) Outcome: Progressing   Problem: Ischemic Stroke/TIA Tissue Perfusion: Goal: Complications of ischemic stroke/TIA will be minimized Outcome: Progressing   Problem: Coping: Goal: Will verbalize positive feelings about self Outcome: Progressing Goal: Will identify appropriate support needs Outcome: Progressing   Problem: Health Behavior/Discharge Planning: Goal: Ability to manage health-related needs will improve Outcome: Progressing Goal: Goals will be collaboratively established with patient/family Outcome: Progressing   Problem: Self-Care: Goal: Ability to participate in self-care as condition permits will improve Outcome: Progressing Goal: Verbalization of feelings and concerns over difficulty with self-care will improve Outcome: Progressing Goal: Ability to communicate needs accurately will improve Outcome: Progressing   Problem: Nutrition: Goal: Risk of aspiration will decrease Outcome: Progressing Goal: Dietary intake will improve Outcome: Progressing

## 2023-07-11 NOTE — Progress Notes (Signed)
Physical Therapy Treatment Patient Details Name: Jeremy Lopez MRN: [id number] DOB: Feb 24, 1951 Today's Date: 07/11/2023   History of Present Illness Pt is a 72 y.o. male presenting 9/27 with R weakness and dysequilibrium. MRI brain revealed acute/early subacute infarct of the medial inferior right cerebellum. CAD, EtOh use, HTN, HLD.    PT Comments  Eager to work with PT today. Utilized Banker for NMRE, midline reorientation and increasing limits of stability. Pt with notable improvement in seated balance, only slight Rt lateral lean and able to self correct once he starts to drift. Standing NMRE requires BIL UE support and intermittent Mod assist for LOB towards Rt. Gait up to 65 feet today, emphasizing dynamic midline awareness, gait symmetry, step length, and RW placement. Requires up to mod assist for LOB towards Right. Pt with good awareness of deficit but challenging for patient to correct. Patient will continue to benefit from skilled physical therapy services to further improve independence with functional mobility. Patient will benefit from intensive inpatient follow up therapy, >3 hours/day  Pt with mild dizziness throughout session today after sitting up. BP 152/68 HR 61 seated. While standing after several activities BP 114/66, HR 65; no diaphoresis noted today.    If plan is discharge home, recommend the following: A little help with bathing/dressing/bathroom;Assistance with cooking/housework;Assist for transportation;A little help with walking and/or transfers     Equipment Recommendations   (TBD next venue)    Recommendations for Other Services Rehab consult     Precautions / Restrictions Precautions Precautions: Fall Restrictions Weight Bearing Restrictions: No     Mobility  Bed Mobility Overal bed mobility: Needs Assistance Bed Mobility: Supine to Sit     Supine to sit: Supervision     General bed mobility comments: Improved control with transitions,  minimal lean noted once EOB.    Transfers Overall transfer level: Needs assistance Equipment used: Rolling walker (2 wheels) Transfers: Sit to/from Stand Sit to Stand: Contact guard assist           General transfer comment: CGA for safety with sit to stand, notable Rt lean but self corrects, requires RW to stabilize.    Ambulation/Gait Ambulation/Gait assistance: Mod assist Gait Distance (Feet): 65 Feet Assistive device: Rolling walker (2 wheels) Gait Pattern/deviations: Step-through pattern, Decreased step length - right, Decreased stance time - right, Decreased stride length, Decreased dorsiflexion - right, Decreased weight shift to left, Ataxic, Scissoring, Staggering right, Drifts right/left, Narrow base of support, Knees buckling Gait velocity: decr Gait velocity interpretation: <1.31 ft/sec, indicative of household ambulator Pre-gait activities: march in place BIL UE support min assist General Gait Details: Intermittent mod assist for balance due to LOB towards right. Frequent tactile, verbal and visual cues to orient to midline and adjust Rt lean. Cues for gait symmetry with larger Rt step length however ataxic. Cues for RW placement closer to BOS especially with turns, where LOB is most pronounced.   Stairs             Wheelchair Mobility     Tilt Bed    Modified Rankin (Stroke Patients Only) Modified Rankin (Stroke Patients Only) Pre-Morbid Rankin Score: No symptoms Modified Rankin: Moderately severe disability     Balance Overall balance assessment: Needs assistance Sitting-balance support: No upper extremity supported, Feet supported Sitting balance-Leahy Scale: Fair Sitting balance - Comments: Minimal Rt lean. Postural control: Right lateral lean Standing balance support: Bilateral upper extremity supported Standing balance-Leahy Scale: Poor Standing balance comment: reliant on RW; leans Rt, able to  correct with VC 75% of the time unless additional  task introduced.                            Cognition Arousal: Alert Behavior During Therapy: WFL for tasks assessed/performed Overall Cognitive Status: Within Functional Limits for tasks assessed                                          Exercises General Exercises - Lower Extremity Ankle Circles/Pumps: AROM, Both, 10 reps, Seated Quad Sets: Strengthening, Both, 10 reps, Seated Gluteal Sets: Strengthening, Both, 10 reps, Seated Other Exercises Other Exercises: Mirror feedback utilized; seated balance, leaning left and right, reaching for LOS. Other Exercises: Mirror feedback utilized; leaning Lt and Rt with bil hand support, mini squats with equal weight distribution. Heel and toe raises, static march. (required up to mod assist for Rt side LOB.)    General Comments General comments (skin integrity, edema, etc.): Pt with mild dizziness throughout duration of session after sitting up right, not worsened significantly with gait but apparent without diaphoresis today. BP seated 152/68 HR 61; Standing after 3 min 114/66 HR 65.      Pertinent Vitals/Pain Pain Assessment Pain Assessment: No/denies pain    Home Living                          Prior Function            PT Goals (current goals can now be found in the care plan section) Acute Rehab PT Goals Patient Stated Goal: Get well PT Goal Formulation: With patient/family Time For Goal Achievement: 07/19/23 Potential to Achieve Goals: Good Progress towards PT goals: Progressing toward goals    Frequency    Min 1X/week      PT Plan      Co-evaluation              AM-PAC PT "6 Clicks" Mobility   Outcome Measure  Help needed turning from your back to your side while in a flat bed without using bedrails?: A Little Help needed moving from lying on your back to sitting on the side of a flat bed without using bedrails?: A Little Help needed moving to and from a bed to a chair  (including a wheelchair)?: A Little Help needed standing up from a chair using your arms (e.g., wheelchair or bedside chair)?: A Little Help needed to walk in hospital room?: A Lot Help needed climbing 3-5 steps with a railing? : A Lot 6 Click Score: 16    End of Session Equipment Utilized During Treatment: Gait belt Activity Tolerance: Patient tolerated treatment well Patient left: with call bell/phone within reach;in chair;with chair alarm set;with SCD's reapplied Nurse Communication: Mobility status (BP) PT Visit Diagnosis: Unsteadiness on feet (R26.81);Other abnormalities of gait and mobility (R26.89);Other symptoms and signs involving the nervous system (R29.898);Hemiplegia and hemiparesis Hemiplegia - Right/Left: Right Hemiplegia - caused by: Cerebral infarction     Time: 0911-0945 PT Time Calculation (min) (ACUTE ONLY): 34 min  Charges:    $Gait Training: 8-22 mins $Neuromuscular Re-education: 8-22 mins PT General Charges $$ ACUTE PT VISIT: 1 Visit                     [name], PT, DPT [facility]  Rehabilitation Services Physical  Therapist Office: [phone] Website: [email or web address]    [name] 07/11/2023, 11:05 AM

## 2023-07-11 NOTE — Progress Notes (Signed)
IP rehab admissions - We have received authorization from [facility] for acute inpatient rehab admission.  No rehab beds open today.  Will follow up tomorrow for medical readiness and bed availability.  Call for questions.  [phone]

## 2023-07-11 NOTE — H&P (Signed)
Physical Medicine and Rehabilitation Admission H&P   CC: Functional deficits secondary to right medial inferior cerebellum infarct   HPI: Jeremy Lopez is a 72 year old male who presented to ED with disequilibrium, right sided weakness with paresthesias on 07/05/2023. Last known normal 2 AM on 07/06/2023. CT head with noted chronic ventriculomegaly. CT angiogram head/neck with no large vessel occlusion. MRI brain without contrast with acute versus early subacute infarct medial inferior right cerebellum. LDL 34, hemoglobin A1c 5.7. TTE with LVEF 60 to 65%, mild LVH, grade 1 diastolic dysfunction, IVC normal in size. UDS negative. EtOH level less than 10. Seen by neurology with recommendation of aspirin and Plavix x 3 weeks followed by aspirin alone.  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 Marjory Lies. He will have LP rescheduled after 3 weeks of DAPT.  PMH significant for lung cancer (2008), CAD s/p DES 2010, 2021, hypertension, chronic HFpEF. Patient with notable improvement in seated balance, only slight right lateral lean and able to self correct once he starts to drift. Standing NMRE requires BIL UE support and intermittent Mod assist for LOB towards right. Gait up to 65 feet on 10/03, emphasizing dynamic midline awareness, gait symmetry, step length, and RW placement. Requires up to mod assist for LOB towards right. Good awareness of deficit but challenging for patient to correct. The patient requires inpatient medicine and rehabilitation evaluations and services for ongoing dysfunction secondary to right cerebellar infarct.   Review of Systems  Constitutional:  Negative for chills and fever.  HENT:  Positive for congestion. Negative for sore throat.   Eyes: Negative.   Respiratory:  Positive for cough and sputum production.   Cardiovascular: Negative.   Gastrointestinal: Negative.   Genitourinary: Negative.   Musculoskeletal:  Negative.   Skin:  Negative for rash.  Neurological:  Positive for dizziness and weakness.  Psychiatric/Behavioral: Negative.     Past Medical History:  Diagnosis Date   CAD (coronary artery disease), native coronary artery    DES proximal LAD 07/2009; DES to RCA 04/2020   Chronic heart failure with preserved ejection fraction (HFpEF) (HCC)    Essential hypertension    Habitual alcohol use    Hyperlipidemia    PVC's (premature ventricular contractions)    Small cell lung cancer (HCC)    Right upper lobe 2008; no evidence of disease since treatment ended in 11/2007   Past Surgical History:  Procedure Laterality Date   CORONARY ATHERECTOMY N/A 04/26/2020   Procedure: CORONARY ATHERECTOMY;  Surgeon: Corky Crafts, MD;  Location: [facility] INVASIVE CV LAB;  Service: Cardiovascular;  Laterality: N/A;   CORONARY STENT INTERVENTION N/A 04/26/2020   Procedure: CORONARY STENT INTERVENTION;  Surgeon: Corky Crafts, MD;  Location: [facility] INVASIVE CV LAB;  Service: Cardiovascular;  Laterality: N/A;   CORONARY ULTRASOUND/IVUS N/A 04/26/2020   Procedure: Intravascular Ultrasound/IVUS;  Surgeon: Corky Crafts, MD;  Location: [facility] INVASIVE CV LAB;  Service: Cardiovascular;  Laterality: N/A;   Insertion of left subclavian Port-A-Cath  2008   LEFT HEART CATH AND CORONARY ANGIOGRAPHY N/A 07/03/2022   Procedure: LEFT HEART CATH AND CORONARY ANGIOGRAPHY;  Surgeon: Swaz[place], Peter M, MD;  Location: [facility] INVASIVE CV LAB;  Service: Cardiovascular;  Laterality: N/A;   RIGHT/LEFT HEART CATH AND CORONARY ANGIOGRAPHY N/A 04/26/2020   Procedure: RIGHT/LEFT HEART CATH AND CORONARY ANGIOGRAPHY;  Surgeon: Corky Crafts, MD;  Location: [facility] INVASIVE CV LAB;  Service: Cardiovascular;  Laterality: N/A;  TEMPORARY PACEMAKER N/A 04/26/2020   Procedure: TEMPORARY PACEMAKER;  Surgeon: Corky Crafts, MD;  Location: [facility] INVASIVE CV LAB;  Service: Cardiovascular;  Laterality: N/A;   Video bronchoscopy and video  mediastinoscopy  2008   Family History  Problem Relation Age of Onset   Cancer Father    CAD Brother    Hypertension Brother    Depression Maternal Uncle    Alcohol abuse Maternal Uncle    Social History:  reports that he quit smoking about 24 years ago. His smoking use included cigarettes. He started smoking about 56 years ago. He has never used smokeless tobacco. He reports current alcohol use of about 14.0 standard drinks of alcohol per week. He reports current drug use. Drug: Marijuana. Allergies:  Allergies  Allergen Reactions   Cephalexin Anaphylaxis    throat swelling Patient said he can take amoxicillin   Amlodipine Swelling    Leg swelling   Medications Prior to Admission  Medication Sig Dispense Refill   acetaminophen (TYLENOL) 500 MG tablet Take 500 mg by mouth every 6 (six) hours as needed for moderate pain.     albuterol (VENTOLIN HFA) 108 (90 Base) MCG/ACT inhaler Inhale 2 puffs into the lungs every 6 (six) hours as needed for wheezing or shortness of breath.     Cholecalciferol (VITAMIN D) 50 MCG (2000 UT) tablet Take 2,000 Units by mouth daily.     clopidogrel (PLAVIX) 75 MG tablet Take 1 tablet (75 mg total) by mouth daily with breakfast. 90 tablet 3   diltiazem (CARDIZEM CD) 240 MG 24 hr capsule Take 240 mg by mouth daily.     empagliflozin (JARDIANCE) 10 MG TABS tablet Take 1 tablet (10 mg total) by mouth daily before breakfast. 30 tablet 11   escitalopram (LEXAPRO) 10 MG tablet Take 1 tablet (10 mg total) by mouth daily. 30 tablet 1   Fluticasone-Umeclidin-Vilant (TRELEGY ELLIPTA) 100-62.5-25 MCG/ACT AEPB Inhale 1 puff into the lungs daily. 180 each 3   gabapentin (NEURONTIN) 300 MG capsule Take 1 capsule (300 mg total) by mouth at bedtime. 90 capsule 3   metoprolol succinate (TOPROL-XL) 100 MG 24 hr tablet TAKE ONE TABLET (100MG  TOTAL) BY MOUTH DAILY. TAKE WITH OR IMMEDIATELY FOLLOWING A MEAL. 90 tablet 2   nitroGLYCERIN (NITROSTAT) 0.4 MG SL tablet Place 1 tablet  (0.4 mg total) under the tongue every 5 (five) minutes as needed for chest pain. 25 tablet 2   prochlorperazine (COMPAZINE) 5 MG tablet Take 1 tablet (5 mg total) by mouth every 6 (six) hours as needed for nausea or vomiting. 30 tablet 0   rosuvastatin (CRESTOR) 40 MG tablet TAKE ONE (1) TABLET BY MOUTH EVERY DAY 90 tablet 3   traZODone (DESYREL) 50 MG tablet Take 1 tablet (50 mg total) by mouth at bedtime. 90 tablet 1      Home: Home Living Family/patient expects to be discharged to:: Private residence Living Arrangements: Spouse/significant other Available Help at Discharge: Family Type of Home: House Home Access: Level entry Home Layout: One level Bathroom Shower/Tub: Health visitor: Handicapped height Bathroom Accessibility: Yes Home Equipment: Information systems manager  Lives With: Spouse   Functional History: Prior Function Prior Level of Function : Independent/Modified Independent, Driving  Functional Status:  Mobility: Bed Mobility Overal bed mobility: Needs Assistance Bed Mobility: Supine to Sit Supine to sit: Supervision Sit to supine: Supervision General bed mobility comments: Improved control with transitions, minimal lean noted once EOB. Transfers Overall transfer level: Needs assistance Equipment used: Rolling walker (  2 wheels) Transfers: Sit to/from Stand Sit to Stand: Contact guard assist General transfer comment: CGA for safety with sit to stand, notable Rt lean but self corrects, requires RW to stabilize. Ambulation/Gait Ambulation/Gait assistance: Mod assist Gait Distance (Feet): 65 Feet Assistive device: Rolling walker (2 wheels) Gait Pattern/deviations: Step-through pattern, Decreased step length - right, Decreased stance time - right, Decreased stride length, Decreased dorsiflexion - right, Decreased weight shift to left, Ataxic, Scissoring, Staggering right, Drifts right/left, Narrow base of support, Knees buckling General Gait Details:  Intermittent mod assist for balance due to LOB towards right. Frequent tactile, verbal and visual cues to orient to midline and adjust Rt lean. Cues for gait symmetry with larger Rt step length however ataxic. Cues for RW placement closer to BOS especially with turns, where LOB is most pronounced. Gait velocity: decr Gait velocity interpretation: <1.31 ft/sec, indicative of household ambulator Pre-gait activities: march in place BIL UE support min assist    ADL: ADL Overall ADL's : Needs assistance/impaired Eating/Feeding: Modified independent, Bed level Grooming: Set up, Sitting Upper Body Bathing: Set up, Sitting Lower Body Bathing: Moderate assistance, Sit to/from stand Upper Body Dressing : Set up, Sitting Lower Body Dressing: Sit to/from stand, Maximal assistance Toilet Transfer: Moderate assistance, Ambulation, Rolling walker (2 wheels) Functional mobility during ADLs: Moderate assistance, Rolling walker (2 wheels)  Cognition: Cognition Overall Cognitive Status: Within Functional Limits for tasks assessed Arousal/Alertness: Awake/alert Orientation Level: Oriented X4 Year: 2024 Day of Week: Correct Attention: Sustained Sustained Attention: Appears intact Memory: Impaired (2/5 words) Memory Impairment: Retrieval deficit Awareness: Appears intact Problem Solving: Appears intact Safety/Judgment: Appears intact Cognition Arousal: Alert Behavior During Therapy: WFL for tasks assessed/performed Overall Cognitive Status: Within Functional Limits for tasks assessed General Comments: WFL for basic tasks and money management  Physical Exam: Blood pressure 122/68, pulse 69, temperature 98 F (36.7 C), temperature source Oral, resp. rate 19, height 5\' 10"  (1.778 m), weight 97.6 kg, SpO2 96%. Physical Exam Constitutional:      General: He is not in acute distress. HENT:     Head: Normocephalic and atraumatic.     Nose: Nose normal.     Mouth/Throat:     Mouth: Mucous membranes  are moist.  Eyes:     Extraocular Movements: Extraocular movements intact.     Pupils: Pupils are equal, round, and reactive to light.  Cardiovascular:     Rate and Rhythm: Normal rate and regular rhythm.     Heart sounds: No murmur heard.    No gallop.  Pulmonary:     Effort: Pulmonary effort is normal. No respiratory distress.     Breath sounds: No stridor. Wheezing and rhonchi present. No rales.  Abdominal:     General: Bowel sounds are normal. There is no distension.     Palpations: Abdomen is soft. There is no mass.  Musculoskeletal:        General: No swelling or tenderness. Normal range of motion.     Cervical back: Normal range of motion.  Skin:    General: Skin is warm and dry.  Neurological:     Mental Status: He is alert.     Comments: Alert and oriented x 3. Normal insight and awareness. Intact Memory. Normal language and speech. Cranial nerve exam unremarkable. MMT: 5/5 LUE/LLE prox to distal. RUE/RLE 4+/5 prox to distal. Sensory exam normal for light touch and pain in all 4 limbs. Mild right-sided limb ataxia appreciated with FTN and HTS. No abnormal tone appreciated.  Psychiatric:        Mood and Affect: Mood normal.        Behavior: Behavior normal.     No results found for this or any previous visit (from the past 48 hour(s)). No results found.    Blood pressure 122/68, pulse 69, temperature 98 F (36.7 C), temperature source Oral, resp. rate 19, height 5\' 10"  (1.778 m), weight 97.6 kg, SpO2 96%.  Medical Problem List and Plan: 1. Functional deficits secondary to right medial inferior cerebellar infarct.  -hx of gait disorder, ?NPH with shuffling, forward leaning gait but was distinctly different than what he's experienced after this stroke.  -patient may shower  -ELOS/Goals: 10-12 days, mod I goals with PT and OT  2.  Antithrombotics: -DVT/anticoagulation:  Pharmaceutical: Lovenox  -antiplatelet therapy: Aspirin and Plavix for 3 weeks then plavix alone  long term given pt's hx of CAD (cards/neuro on board)  3. Pain Management: Tylenol as needed  -continue gabapentin 300 mg q HS  4. Mood/Behavior/Sleep: LCSW to evaluate and provide emotional support  -continue escitalopram 10 mg daily  -continue home trazodone 50 mg q HS  -antipsychotic agents: n/a   5. Neuropsych/cognition: This patient is capable of making decisions on his own behalf.  6. Skin/Wound Care: Routine skin care checks   7. Fluids/Electrolytes/Nutrition: Routine Is and Os and follow-up chemistries  8: Hypertension: monitor TID and prn  -continue metoprolol succinate 100 mg daily  -continue diltiazem 240 mg daily  9: Hyperlipidemia: continue statin  10: History of CAD; on aspirin and statin  -follow-up with Dr. Dietrich Pates  11: History lung cancer 2008  12: Asthmatic bronchitis/former smoker (at home uses Trelegy/Ventolin)  -continue Breo/Incruse inhalers  -add mucinex dm   -add prn duoneb  -follow-up with Dr. Tonia Brooms  12: Gait disturbance: recent work-up as outpatient by Dr. Marjory Lies  -to re-schedule LP for findings of ventriculomegaly/NPH  14: Chronic HFpEF: check daily weights          Milinda Antis, PA-C 07/11/2023

## 2023-07-12 DIAGNOSIS — R42 Dizziness and giddiness: Secondary | ICD-10-CM

## 2023-07-12 LAB — CBC
HCT: 40.7 % (ref 39.0–52.0)
Hemoglobin: 13.1 g/dL (ref 13.0–17.0)
MCH: 29.8 pg (ref 26.0–34.0)
MCHC: 32.2 g/dL (ref 30.0–36.0)
MCV: 92.5 fL (ref 80.0–100.0)
Platelets: 351 10*3/uL (ref 150–400)
RBC: 4.4 MIL/uL (ref 4.22–5.81)
RDW: 13.9 % (ref 11.5–15.5)
WBC: 8.2 10*3/uL (ref 4.0–10.5)
nRBC: 0 % (ref 0.0–0.2)

## 2023-07-12 LAB — BASIC METABOLIC PANEL
Anion gap: 9 (ref 5–15)
BUN: 21 mg/dL (ref 8–23)
CO2: 26 mmol/L (ref 22–32)
Calcium: 8.6 mg/dL — ABNORMAL LOW (ref 8.9–10.3)
Chloride: 100 mmol/L (ref 98–111)
Creatinine, Ser: 1.14 mg/dL (ref 0.61–1.24)
GFR, Estimated: >60 mL/min (ref >60)
Glucose, Bld: 94 mg/dL (ref 70–99)
Potassium: 4 mmol/L (ref 3.5–5.1)
Sodium: 135 mmol/L (ref 135–145)

## 2023-07-12 MED ORDER — METOPROLOL SUCCINATE ER 50 MG PO TB24
50.0000 mg | ORAL_TABLET | Freq: Every day | ORAL | Status: DC
  Administered 2023-07-13: 50 mg via ORAL
  Filled 2023-07-12: qty 1

## 2023-07-12 NOTE — Progress Notes (Signed)
Occupational Therapy Treatment Patient Details Name: Jeremy Lopez MRN: [id number] DOB: 1950-10-11 Today's Date: 07/12/2023   History of present illness Pt is a 72 y.o. male presenting 9/27 with R weakness and dysequilibrium. MRI brain revealed acute/early subacute infarct of the medial inferior right cerebellum. CAD, EtOh use, HTN, HLD.   OT comments  Patient received in supine and motivated to get OOB. Patient demonstrating gains with bed mobility with supervision to get to EOB and min right lateral leaning initially but able to correct with verbal cues. Patient performed UB bathing and grooming standing at sink with one extremity support and demonstrating right lateral leaning which patient was able to correct with verbal and tactile cues. Patient will benefit from intensive inpatient follow up therapy, >3 hours/day to address following directions, bathing, and dressing. Acute OT to continue to follow.       If plan is discharge home, recommend the following:  A lot of help with walking and/or transfers;A lot of help with bathing/dressing/bathroom;Assistance with cooking/housework;Direct supervision/assist for medications management;Direct supervision/assist for financial management;Assist for transportation;Help with stairs or ramp for entrance   Equipment Recommendations  Other (comment)    Recommendations for Other Services Rehab consult    Precautions / Restrictions Precautions Precautions: Fall Restrictions Weight Bearing Restrictions: No       Mobility Bed Mobility Overal bed mobility: Needs Assistance Bed Mobility: Supine to Sit     Supine to sit: Supervision     General bed mobility comments: min right lateral lean initially but corrected with cues    Transfers Overall transfer level: Needs assistance Equipment used: Rolling walker (2 wheels) Transfers: Sit to/from Stand Sit to Stand: Contact guard assist           General transfer comment: min assist with  cues for hand placement for transfers     Balance Overall balance assessment: Needs assistance Sitting-balance support: No upper extremity supported, Feet supported Sitting balance-Leahy Scale: Fair Sitting balance - Comments: Minimal Rt lean. Postural control: Right lateral lean Standing balance support: Single extremity supported, Bilateral upper extremity supported, During functional activity Standing balance-Leahy Scale: Poor Standing balance comment: stood at sink for self care task with right lateral leaning and verbal and tactile cues to correct.                           ADL either performed or assessed with clinical judgement   ADL Overall ADL's : Needs assistance/impaired     Grooming: Wash/dry hands;Wash/dry face;Oral care;Applying deodorant;Brushing hair;Contact guard assist;Standing Grooming Details (indicate cue type and reason): right lateral leaning while standing at sink with cues to correct Upper Body Bathing: Contact guard assist;Standing               Toilet Transfer: Minimal assistance;Moderate assistance;Rolling walker (2 wheels);Ambulation Toilet Transfer Details (indicate cue type and reason): assistance due to right lateral leaning                Extremity/Trunk Assessment              Vision       Perception     Praxis      Cognition Arousal: Alert Behavior During Therapy: WFL for tasks assessed/performed Overall Cognitive Status: Impaired/Different from baseline Area of Impairment: Memory                     Memory: Decreased short-term memory         General  Comments: following one step commands consistantly        Exercises      Shoulder Instructions       General Comments VSS on complaints of dizziness during session    Pertinent Vitals/ Pain       Pain Assessment Pain Assessment: No/denies pain  Home Living                                          Prior  Functioning/Environment              Frequency  Min 1X/week        Progress Toward Goals  OT Goals(current goals can now be found in the care plan section)  Progress towards OT goals: Progressing toward goals  Acute Rehab OT Goals Patient Stated Goal: get better OT Goal Formulation: With patient Time For Goal Achievement: 07/20/23 Potential to Achieve Goals: Good ADL Goals Pt Will Perform Grooming: with supervision;standing Pt Will Perform Lower Body Dressing: with supervision;sit to/from stand Pt Will Transfer to Toilet: with supervision;ambulating;regular height toilet  Plan      Co-evaluation                 AM-PAC OT "6 Clicks" Daily Activity     Outcome Measure   Help from another person eating meals?: None Help from another person taking care of personal grooming?: A Little Help from another person toileting, which includes using toliet, bedpan, or urinal?: A Lot Help from another person bathing (including washing, rinsing, drying)?: A Lot Help from another person to put on and taking off regular upper body clothing?: A Little Help from another person to put on and taking off regular lower body clothing?: A Lot 6 Click Score: 16    End of Session Equipment Utilized During Treatment: Gait belt;Rolling walker (2 wheels)  OT Visit Diagnosis: Unsteadiness on feet (R26.81);Muscle weakness (generalized) (M62.81);Other abnormalities of gait and mobility (R26.89)   Activity Tolerance Patient tolerated treatment well   Patient Left in chair;with call bell/phone within reach;with chair alarm set   Nurse Communication Mobility status        Time: 9147-8295 OT Time Calculation (min): 24 min  Charges: OT General Charges $OT Visit: 1 Visit OT Treatments $Self Care/Home Management : 23-37 mins  Alfonse Flavors, OTA Acute Rehabilitation Services  Office [phone]   Dewain Penning 07/12/2023, 7:58 AM

## 2023-07-12 NOTE — Progress Notes (Signed)
PROGRESS NOTE    Jeremy Lopez  WUJ:[id number] DOB: April 25, 1951 DOA: 07/05/2023 PCP: [name], MD  Chief Complaint  Patient presents with   Weakness    Brief Narrative:   Jeremy Lopez is Jeremy Lopez 72 y.o. male with past medical history significant for essential hypertension, CAD s/p PCI/DES, HLD, major depressive disorder, Hx lung cancer s/p chemotherapy/radiation, asthmatic bronchitis, EtOH use, former tobacco abuse who presented to [facility] ED on 9/27 via EMS for right-sided weakness, headache, disequilibrium.  Also endorsed paresthesias to right side of face and bilateral upper extremities that subsided in 10 minutes.  Initial symptoms onset at 4 AM in which he was feeling fine at 2 AM.  Given patient having significant trouble getting in downstairs, EMS was activated and patient was brought to ED for further evaluation.   In the ED, temperature 97.8 F, HR 75, RR 14, BP 158/74, SpO2 94% on room air.  WBC 10.8, hemoglobin 14.6, platelet count 339.  Sodium 134, potassium 3.6, chloride 101, CO2 22, glucose 110, BUN 16, creat 1.06.  AST 12, ALT 15, total bili Ruben 0.8.  Total cholesterol 107, LDL 38, HDL 48.  Urinalysis unrevealing.  UDS negative.  EtOH level less than 10.  CT head without contrast with chronic ventriculomegaly, stable since recent MRI.  CT angiogram head/neck with no large vessel occlusion.  MRI brain without contrast with early subacute versus acute infarct medial inferior right cerebellum.  EKG with NSR.  Neurology was consulted, recommended transfer to [facility] for continued stroke evaluation.  TRH consulted for admission and patient was transferred to [facility].  Assessment & Plan:   Principal Problem:   CVA (cerebral vascular accident) [facility]) Active Problems:   Mixed hyperlipidemia   Essential hypertension   CAD (coronary artery disease), native coronary artery   Headache   Cerebellar stroke, acute (HCC)  Acute ischemic cerebellar  CVA Patient presenting to ED with disequilibrium, right sided weakness with paresthesias.  Last known normal 2 AM on 07/06/2023.  CT head with noted chronic ventriculomegaly.  CT angiogram head/neck with no large vessel occlusion.  MRI brain without contrast with acute versus early subacute infarct medial inferior right cerebellum.  LDL 34, hemoglobin A1c 5.7.  TTE with LVEF 60 to 65%, mild LVH, grade 1 diastolic dysfunction, IVC normal in size.  Seen by neurology with recommendation of aspirin and Plavix x 3 weeks followed by aspirin alone.  Seen by PT/OT with recommendation of CIR placement.  Outpatient follow-up with neurology 3-4 weeks. -- Pending CIR placement; medically stable for discharge once bed available and receives insurance authorization   Orthostasis Stop diltiazem, reduce metoprolol Thigh high compression stockings Trend - adjust regimen as indicated  Concern for normal pressure hydrocephalus CT head with chronic ventriculomegaly, stable since recent MRI.  Symptoms have persisted over the last 11 months, currently following with neurology, Dr. Marjory Lies outpatient.  Was scheduled for an LP this upcoming week but will have to postpone given new stroke diagnosis. -- Recommend LP reschedule after 3 weeks of DAPT.   Essential hypertension Home regimen includes diltiazem 240 mg p.o. daily, metoprolol succinate 100 mg p.o. daily. -- stopping dilt due to orthostasis -- reducing metoprolol    CAD s/p PCI/DES Follows with cardiology outpatient.  Continue aspirin and statin.   Hyperlipidemia Total cholesterol 107, LDL 38, HDL 48. -- Continue home Crestor 40 mg p.o. daily   Major depressive disorder -- Lexapro 10 mg p.o. daily   History of  lung cancer s/p chemotherapy/radiation Follows with pulmonology outpatient, continue outpatient pulmonary rehab.   Asthmatic bronchitis Former tobacco use disorder Follow with pulmonology outpatient, Dr. Tonia Brooms.  On Trelegy Ellipta  outpatient. -- Continue Breo Ellipta and Incruse Ellipta as hospital substitution 1 puff daily   EtOH use disorder Counseled on need for complete cessation/abstinence.   Obese Body mass index is 30.87 kg/m.     DVT prophylaxis: lovenox Code Status: full Family Communication: wife at bedside Disposition:   Status is: Inpatient Remains inpatient appropriate because: awaiting CIR   Consultants:  neurology  Procedures:   Echo IMPRESSIONS     1. Left ventricular ejection fraction, by estimation, is 60 to 65%. The  left ventricle has normal function. The left ventricle has no regional  wall motion abnormalities. There is mild left ventricular hypertrophy.  Left ventricular diastolic parameters  are consistent with Grade I diastolic dysfunction (impaired relaxation).   2. Right ventricular systolic function is normal. The right ventricular  size is normal. Tricuspid regurgitation signal is inadequate for assessing  PA pressure.   3. The mitral valve is normal in structure. No evidence of mitral valve  regurgitation.   4. The aortic valve is tricuspid. Aortic valve regurgitation is not  visualized. Aortic valve sclerosis is present, with no evidence of aortic  valve stenosis.   5. The inferior vena cava is normal in size with greater than 50%  respiratory variability, suggesting right atrial pressure of 3 mmHg.    Antimicrobials:  Anti-infectives (From admission, onward)    None       Subjective: No new complaints  Objective: Vitals:   07/12/23 0517 07/12/23 0801 07/12/23 1130 07/12/23 1728  BP: (!) 129/56 108/64 (!) 112/53 (!) 138/54  Pulse: 66 64  61  Resp: 18 18 18 18   Temp:  98.3 F (36.8 C) 97.6 F (36.4 C) 98.4 F (36.9 C)  TempSrc:  Oral Oral Oral  SpO2: 94% 96% 96%   Weight:      Height:        Intake/Output Summary (Last 24 hours) at 07/12/2023 2006 Last data filed at 07/11/2023 2025 Gross per 24 hour  Intake 240 ml  Output 275 ml  Net -35  ml   Filed Weights   07/05/23 1507 07/05/23 2330  Weight: 97.7 kg 97.6 kg    Examination:  General: No acute distress. Cardiovascular: RRR Lungs: unlabored Neurological: Alert and oriented 3. Moves all extremities 4 with equal strength. Cranial nerves II through XII grossly intact. Extremities: No clubbing or cyanosis. No edema.  Data Reviewed: I have personally reviewed following labs and imaging studies  CBC: Recent Labs  Lab 07/06/23 0602 07/12/23 0702  WBC 9.4 8.2  HGB 14.2 13.1  HCT 42.7 40.7  MCV 92.8 92.5  PLT 298 351    Basic Metabolic Panel: Recent Labs  Lab 07/06/23 0602 07/12/23 0702  NA 134* 135  K 3.6 4.0  CL 99 100  CO2 25 26  GLUCOSE 95 94  BUN 18 21  CREATININE 1.36* 1.14  CALCIUM 8.5* 8.6*    GFR: Estimated Creatinine Clearance: 68.6 mL/min (by C-G formula based on SCr of 1.14 mg/dL).  Liver Function Tests: Recent Labs  Lab 07/06/23 0602  AST 11*  ALT 13  ALKPHOS 61  BILITOT 0.6  PROT 6.0*  ALBUMIN 3.3*    CBG: No results for input(s): "GLUCAP" in the last 168 hours.   No results found for this or any previous visit (from the past 240  hour(s)).       Radiology Studies: No results found.      Scheduled Meds:  aspirin EC  81 mg Oral Daily   clopidogrel  75 mg Oral Daily   enoxaparin (LOVENOX) injection  40 mg Subcutaneous Q24H   escitalopram  10 mg Oral Daily   fluticasone furoate-vilanterol  1 puff Inhalation Daily   And   umeclidinium bromide  1 puff Inhalation Daily   gabapentin  300 mg Oral QHS   loratadine  10 mg Oral Daily   [START ON 07/13/2023] metoprolol succinate  50 mg Oral Daily   rosuvastatin  40 mg Oral Daily   traZODone  50 mg Oral QHS   Continuous Infusions:   LOS: 5 days    Time spent: over 30 min    Lacretia Nicks, MD [facility]   To contact the attending provider between 7A-7P or the covering provider during after hours 7P-7A, please log into the web site [email or web address]  and access using universal Rew password for that web site. If you do not have the password, please call the hospital operator.  07/12/2023, 8:06 PM

## 2023-07-12 NOTE — Plan of Care (Signed)

## 2023-07-12 NOTE — Progress Notes (Signed)
IP rehab admissions - I will have a bed available on inpatient rehab tomorrow, Saturday.  Dr. Riley Kill will check patient in am.  Please have nurse call unit around 12 noon at [phone] and check for room number and then have nurse give report.  Call me for any questions.  [phone]

## 2023-07-12 NOTE — Plan of Care (Signed)

## 2023-07-12 NOTE — Progress Notes (Signed)
Patient is alert and oriented x4. Complained of earache and received hycodan for pain. Ambulated patient and noticed RLE ataxia and dizziness. Patient scored a yellow MEWS this morning due to standing bp being low. Expected and not a true MEWS. Rechecked bp and it was normal. Was unable to do a second standing orthostatic due to standing dizziness. Asked patient about his water intake and he admitted that he has not been drinking hydrating fluids. Does drink gingerale occasionally. Educated patient regarding fluid intake and took him a pitcher of ice water. Additional needs denied.

## 2023-07-13 ENCOUNTER — Encounter (HOSPITAL_COMMUNITY): Payer: Self-pay | Admitting: Physical Medicine and Rehabilitation

## 2023-07-13 ENCOUNTER — Other Ambulatory Visit: Payer: Self-pay

## 2023-07-13 ENCOUNTER — Inpatient Hospital Stay (HOSPITAL_COMMUNITY)
Admission: AD | Admit: 2023-07-13 | Discharge: 2023-07-21 | DRG: 057 | Disposition: A | Discharge status: Home / Self Care | Payer: HMO | Source: Intra-hospital | Attending: Physical Medicine and Rehabilitation | Admitting: Physical Medicine and Rehabilitation

## 2023-07-13 DIAGNOSIS — R058 Other specified cough: Secondary | ICD-10-CM | POA: Diagnosis not present

## 2023-07-13 DIAGNOSIS — Z7984 Long term (current) use of oral hypoglycemic drugs: Secondary | ICD-10-CM | POA: Diagnosis not present

## 2023-07-13 DIAGNOSIS — I69351 Hemiplegia and hemiparesis following cerebral infarction affecting right dominant side: Principal | ICD-10-CM

## 2023-07-13 DIAGNOSIS — E785 Hyperlipidemia, unspecified: Secondary | ICD-10-CM | POA: Diagnosis present

## 2023-07-13 DIAGNOSIS — R2689 Other abnormalities of gait and mobility: Secondary | ICD-10-CM | POA: Diagnosis present

## 2023-07-13 DIAGNOSIS — I5032 Chronic diastolic (congestive) heart failure: Secondary | ICD-10-CM | POA: Diagnosis not present

## 2023-07-13 DIAGNOSIS — I63211 Cerebral infarction due to unspecified occlusion or stenosis of right vertebral arteries: Secondary | ICD-10-CM | POA: Diagnosis not present

## 2023-07-13 DIAGNOSIS — I110 Hypertensive heart disease with heart failure: Secondary | ICD-10-CM | POA: Diagnosis not present

## 2023-07-13 DIAGNOSIS — F32A Depression, unspecified: Secondary | ICD-10-CM | POA: Diagnosis present

## 2023-07-13 DIAGNOSIS — Z85118 Personal history of other malignant neoplasm of bronchus and lung: Secondary | ICD-10-CM | POA: Diagnosis not present

## 2023-07-13 DIAGNOSIS — Z7902 Long term (current) use of antithrombotics/antiplatelets: Secondary | ICD-10-CM

## 2023-07-13 DIAGNOSIS — R269 Unspecified abnormalities of gait and mobility: Secondary | ICD-10-CM

## 2023-07-13 DIAGNOSIS — J418 Mixed simple and mucopurulent chronic bronchitis: Secondary | ICD-10-CM | POA: Diagnosis not present

## 2023-07-13 DIAGNOSIS — E871 Hypo-osmolality and hyponatremia: Secondary | ICD-10-CM | POA: Diagnosis present

## 2023-07-13 DIAGNOSIS — I2510 Atherosclerotic heart disease of native coronary artery without angina pectoris: Secondary | ICD-10-CM | POA: Diagnosis present

## 2023-07-13 DIAGNOSIS — G9389 Other specified disorders of brain: Secondary | ICD-10-CM | POA: Diagnosis not present

## 2023-07-13 DIAGNOSIS — Z79899 Other long term (current) drug therapy: Secondary | ICD-10-CM

## 2023-07-13 DIAGNOSIS — Z8249 Family history of ischemic heart disease and other diseases of the circulatory system: Secondary | ICD-10-CM | POA: Diagnosis not present

## 2023-07-13 DIAGNOSIS — I700 Atherosclerosis of aorta: Secondary | ICD-10-CM | POA: Diagnosis not present

## 2023-07-13 DIAGNOSIS — I10 Essential (primary) hypertension: Secondary | ICD-10-CM | POA: Diagnosis not present

## 2023-07-13 DIAGNOSIS — J45909 Unspecified asthma, uncomplicated: Secondary | ICD-10-CM | POA: Diagnosis present

## 2023-07-13 DIAGNOSIS — Z87891 Personal history of nicotine dependence: Secondary | ICD-10-CM | POA: Diagnosis not present

## 2023-07-13 DIAGNOSIS — I69398 Other sequelae of cerebral infarction: Secondary | ICD-10-CM | POA: Diagnosis not present

## 2023-07-13 DIAGNOSIS — G912 (Idiopathic) normal pressure hydrocephalus: Secondary | ICD-10-CM | POA: Diagnosis not present

## 2023-07-13 DIAGNOSIS — R0602 Shortness of breath: Secondary | ICD-10-CM | POA: Diagnosis not present

## 2023-07-13 DIAGNOSIS — I639 Cerebral infarction, unspecified: Secondary | ICD-10-CM | POA: Diagnosis not present

## 2023-07-13 DIAGNOSIS — G629 Polyneuropathy, unspecified: Secondary | ICD-10-CM | POA: Diagnosis present

## 2023-07-13 DIAGNOSIS — Z7982 Long term (current) use of aspirin: Secondary | ICD-10-CM

## 2023-07-13 DIAGNOSIS — R200 Anesthesia of skin: Secondary | ICD-10-CM

## 2023-07-13 DIAGNOSIS — I63541 Cerebral infarction due to unspecified occlusion or stenosis of right cerebellar artery: Secondary | ICD-10-CM | POA: Diagnosis present

## 2023-07-13 DIAGNOSIS — J411 Mucopurulent chronic bronchitis: Secondary | ICD-10-CM | POA: Diagnosis not present

## 2023-07-13 MED ORDER — ALUM & MAG HYDROXIDE-SIMETH 200-200-20 MG/5ML PO SUSP: 30.0000 mL | ORAL | Status: DC | PRN

## 2023-07-13 MED ORDER — ASPIRIN 81 MG PO TBEC
81.0000 mg | DELAYED_RELEASE_TABLET | Freq: Every day | ORAL | Status: DC
  Administered 2023-07-14 – 2023-07-21 (×8): 81 mg via ORAL
  Filled 2023-07-13 (×8): qty 1

## 2023-07-13 MED ORDER — ROSUVASTATIN CALCIUM 20 MG PO TABS
40.0000 mg | ORAL_TABLET | Freq: Every day | ORAL | Status: DC
  Administered 2023-07-14 – 2023-07-21 (×8): 40 mg via ORAL
  Filled 2023-07-13 (×8): qty 2

## 2023-07-13 MED ORDER — GUAIFENESIN-DM 100-10 MG/5ML PO SYRP
10.0000 mL | ORAL_SOLUTION | Freq: Four times a day (QID) | ORAL | Status: DC | PRN
  Administered 2023-07-16 – 2023-07-19 (×5): 10 mL via ORAL
  Filled 2023-07-13 (×5): qty 10

## 2023-07-13 MED ORDER — ENOXAPARIN SODIUM 40 MG/0.4ML IJ SOSY
40.0000 mg | PREFILLED_SYRINGE | INTRAMUSCULAR | Status: DC
  Administered 2023-07-14 – 2023-07-17 (×4): 40 mg via SUBCUTANEOUS
  Filled 2023-07-13 (×4): qty 0.4

## 2023-07-13 MED ORDER — DM-GUAIFENESIN ER 30-600 MG PO TB12
1.0000 | ORAL_TABLET | Freq: Two times a day (BID) | ORAL | Status: DC
  Administered 2023-07-13 – 2023-07-21 (×15): 1 via ORAL
  Filled 2023-07-13 (×16): qty 1

## 2023-07-13 MED ORDER — ONDANSETRON HCL 4 MG/2ML IJ SOLN: 4.0000 mg | Freq: Four times a day (QID) | INTRAMUSCULAR | Status: DC | PRN

## 2023-07-13 MED ORDER — LORATADINE 10 MG PO TABS
10.0000 mg | ORAL_TABLET | Freq: Every day | ORAL | Status: DC
  Administered 2023-07-14 – 2023-07-21 (×8): 10 mg via ORAL
  Filled 2023-07-13 (×8): qty 1

## 2023-07-13 MED ORDER — FLUTICASONE FUROATE-VILANTEROL 100-25 MCG/ACT IN AEPB
1.0000 | INHALATION_SPRAY | Freq: Every day | RESPIRATORY_TRACT | Status: DC
  Administered 2023-07-14 – 2023-07-21 (×8): 1 via RESPIRATORY_TRACT
  Filled 2023-07-13: qty 28

## 2023-07-13 MED ORDER — METOPROLOL SUCCINATE ER 50 MG PO TB24: 50.0000 mg | ORAL_TABLET | Freq: Every day | ORAL | Status: DC

## 2023-07-13 MED ORDER — POLYETHYLENE GLYCOL 3350 17 G PO PACK: 17.0000 g | PACK | Freq: Every day | ORAL | Status: DC | PRN

## 2023-07-13 MED ORDER — ONDANSETRON HCL 4 MG PO TABS: 4.0000 mg | ORAL_TABLET | Freq: Four times a day (QID) | ORAL | Status: DC | PRN

## 2023-07-13 MED ORDER — UMECLIDINIUM BROMIDE 62.5 MCG/ACT IN AEPB
1.0000 | INHALATION_SPRAY | Freq: Every day | RESPIRATORY_TRACT | Status: DC
  Administered 2023-07-14 – 2023-07-21 (×8): 1 via RESPIRATORY_TRACT
  Filled 2023-07-13 (×2): qty 7

## 2023-07-13 MED ORDER — IPRATROPIUM-ALBUTEROL 0.5-2.5 (3) MG/3ML IN SOLN: 3.0000 mL | RESPIRATORY_TRACT | Status: DC | PRN

## 2023-07-13 MED ORDER — METOPROLOL SUCCINATE ER 50 MG PO TB24
50.0000 mg | ORAL_TABLET | Freq: Every day | ORAL | Status: DC
  Administered 2023-07-14 – 2023-07-21 (×8): 50 mg via ORAL
  Filled 2023-07-13 (×8): qty 1

## 2023-07-13 MED ORDER — ESCITALOPRAM OXALATE 10 MG PO TABS
10.0000 mg | ORAL_TABLET | Freq: Every day | ORAL | Status: DC
  Administered 2023-07-14 – 2023-07-21 (×8): 10 mg via ORAL
  Filled 2023-07-13 (×8): qty 1

## 2023-07-13 MED ORDER — SORBITOL 70 % SOLN: 30.0000 mL | Freq: Every day | Status: DC | PRN

## 2023-07-13 MED ORDER — ENOXAPARIN SODIUM 40 MG/0.4ML IJ SOSY: 40.0000 mg | PREFILLED_SYRINGE | INTRAMUSCULAR | Status: DC

## 2023-07-13 MED ORDER — ACETAMINOPHEN 325 MG PO TABS
325.0000 mg | ORAL_TABLET | ORAL | Status: DC | PRN
  Administered 2023-07-14 – 2023-07-16 (×2): 650 mg via ORAL
  Filled 2023-07-13 (×2): qty 2

## 2023-07-13 MED ORDER — METHOCARBAMOL 500 MG PO TABS: 500.0000 mg | ORAL_TABLET | Freq: Four times a day (QID) | ORAL | Status: DC | PRN

## 2023-07-13 MED ORDER — GABAPENTIN 300 MG PO CAPS
300.0000 mg | ORAL_CAPSULE | Freq: Every day | ORAL | Status: DC
  Administered 2023-07-13 – 2023-07-17 (×5): 300 mg via ORAL
  Filled 2023-07-13 (×5): qty 1

## 2023-07-13 MED ORDER — ASPIRIN 81 MG PO TBEC: 81.0000 mg | DELAYED_RELEASE_TABLET | Freq: Every day | ORAL | Status: DC

## 2023-07-13 MED ORDER — HYDROCODONE BIT-HOMATROP MBR 5-1.5 MG/5ML PO SOLN
5.0000 mL | Freq: Four times a day (QID) | ORAL | Status: DC | PRN
  Administered 2023-07-13 – 2023-07-18 (×6): 5 mL via ORAL
  Filled 2023-07-13 (×6): qty 5

## 2023-07-13 MED ORDER — TRAZODONE HCL 50 MG PO TABS
50.0000 mg | ORAL_TABLET | Freq: Every day | ORAL | Status: DC
  Administered 2023-07-13 – 2023-07-20 (×8): 50 mg via ORAL
  Filled 2023-07-13 (×8): qty 1

## 2023-07-13 MED ORDER — FLEET ENEMA RE ENEM: 1.0000 | ENEMA | Freq: Once | RECTAL | Status: DC | PRN

## 2023-07-13 MED ORDER — CLOPIDOGREL BISULFATE 75 MG PO TABS
75.0000 mg | ORAL_TABLET | Freq: Every day | ORAL | Status: DC
  Administered 2023-07-14 – 2023-07-21 (×8): 75 mg via ORAL
  Filled 2023-07-13 (×8): qty 1

## 2023-07-13 NOTE — Progress Notes (Signed)
Inpatient Rehabilitation Admission Medication Review by a Pharmacist  A complete drug regimen review was completed for this patient to identify any potential clinically significant medication issues.  High Risk Drug Classes Is patient taking? Indication by Medication  Antipsychotic {Receiving?:26196}   Anticoagulant {Receiving?:26196}   Antibiotic {Receiving?:26196}   Opioid {Receiving?:26196}   Antiplatelet {Receiving?:26196} DAPT x 3 weeks followed by plavix alone CAD s/p PCI/DES  Started 07/06/23  Hypoglycemics/insulin {Yes or No?:26198} Jardiance  Vasoactive Medication {Receiving?:26196} Toprol: HTN  Chemotherapy No   Other {Yes or No?:26198} Crestor: hyperlipidemia Lexapro: MDD Albuterol, Breo Ellipta, Incruse Ellipta (sub for Trelegy): asthmatic bronchitis Tylenol: pain Gabapentin: *** Nitroglycerin: chest pain *** Compazine: N/V *** Trazodone: *** vitD: vitamin/supplement      Type of Medication Issue Identified Description of Issue Recommendation(s)  Drug Interaction(s) (clinically significant)     Duplicate Therapy     Allergy     No Medication Administration End Date     Incorrect Dose     Additional Drug Therapy Needed     Significant med changes from prior encounter (inform family/care partners about these prior to discharge). Toprol dose reduced? *** *** Stop diltiazem due to orthostasis Restart or discontinue as appropriate. Communicate medication changes with patient/family at discharge  Other       Clinically significant medication issues were identified that warrant physician communication and completion of prescribed/recommended actions by midnight of the next day:  {Yes or No?:26198}  Name of provider notified for urgent issues identified: ***   Provider Method of Notification: ***    Pharmacist comments: ***   Time spent performing this drug regimen review (minutes): 30   Thank you for allowing pharmacy to be a part of this patient's  care.   Signe Colt, PharmD 07/13/2023 4:10 PM    **Pharmacist phone directory can be found on [email or web address] listed under [facility]**

## 2023-07-13 NOTE — Plan of Care (Signed)
  Problem: Education: Goal: Knowledge of General Education information will improve Description: Including pain rating scale, medication(s)/side effects and non-pharmacologic comfort measures 07/13/2023 0609 by Dahlia Bailiff, RN Outcome: Adequate for Discharge 07/12/2023 2330 by Dahlia Bailiff, RN Outcome: Progressing   Problem: Health Behavior/Discharge Planning: Goal: Ability to manage health-related needs will improve 07/13/2023 0609 by Dahlia Bailiff, RN Outcome: Adequate for Discharge 07/12/2023 2330 by Dahlia Bailiff, RN Outcome: Progressing   Problem: Clinical Measurements: Goal: Ability to maintain clinical measurements within normal limits will improve 07/13/2023 0609 by Dahlia Bailiff, RN Outcome: Adequate for Discharge 07/12/2023 2330 by Dahlia Bailiff, RN Outcome: Progressing

## 2023-07-13 NOTE — Plan of Care (Signed)
  Problem: Consults Goal: RH STROKE PATIENT EDUCATION Description: See Patient Education module for education specifics  Outcome: Progressing   Problem: RH BOWEL ELIMINATION Goal: RH STG MANAGE BOWEL WITH ASSISTANCE Description: STG Manage Bowel with toileting Assistance. Outcome: Progressing Goal: RH STG MANAGE BOWEL W/MEDICATION W/ASSISTANCE Description: STG Manage Bowel with Medication with mod I Assistance. Outcome: Progressing   Problem: RH SAFETY Goal: RH STG ADHERE TO SAFETY PRECAUTIONS W/ASSISTANCE/DEVICE Description: STG Adhere to Safety Precautions With cues Assistance/Device. Outcome: Progressing   Problem: RH COGNITION-NURSING Goal: RH STG USES MEMORY AIDS/STRATEGIES W/ASSIST TO PROBLEM SOLVE Description: STG Uses Memory Aids/Strategies With cues Assistance to Problem Solve. Outcome: Progressing   Problem: RH KNOWLEDGE DEFICIT Goal: RH STG INCREASE KNOWLEDGE OF HYPERTENSION Description: Patient and spouse will be able to manage HTN with medications and dietary modification using educational resources independently Outcome: Progressing Goal: RH STG INCREASE KNOWLEGDE OF HYPERLIPIDEMIA Description: Patient and spouse will be able to manage HLD with medications and dietary modification using educational resources independently Outcome: Progressing Goal: RH STG INCREASE KNOWLEDGE OF STROKE PROPHYLAXIS Description: Patient and spouse will be able to manage secondary risks with medications and dietary modification using educational resources independently Outcome: Progressing

## 2023-07-13 NOTE — Plan of Care (Signed)
Discussed with Dr. Lowell Guitar.    Dr. Lowell Guitar noticed pt's cardiologist recommend plavix for his heart disease. In light of this recommend keeping plavix as monotherapy after DAPT is completed.

## 2023-07-13 NOTE — H&P (Signed)
Physical Medicine and Rehabilitation Admission H&P     CC: Functional deficits secondary to right medial inferior cerebellum infarct    HPI: Jeremy Lopez is a 72 year old male who presented to ED with disequilibrium, right sided weakness with paresthesias on 07/05/2023. Last known normal 2 AM on 07/06/2023. CT head with noted chronic ventriculomegaly. CT angiogram head/neck with no large vessel occlusion. MRI brain without contrast with acute versus early subacute infarct medial inferior right cerebellum. LDL 34, hemoglobin A1c 5.7. TTE with LVEF 60 to 65%, mild LVH, grade 1 diastolic dysfunction, IVC normal in size. UDS negative. EtOH level less than 10. Seen by neurology with recommendation of aspirin and Plavix x 3 weeks followed by aspirin alone.  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 Marjory Lies. He will have LP rescheduled after 3 weeks of DAPT.  PMH significant for lung cancer (2008), CAD s/p DES 2010, 2021, hypertension, chronic HFpEF. Patient with notable improvement in seated balance, only slight right lateral lean and able to self correct once he starts to drift. Standing NMRE requires BIL UE support and intermittent Mod assist for LOB towards right. Gait up to 65 feet on 10/03, emphasizing dynamic midline awareness, gait symmetry, step length, and RW placement. Requires up to mod assist for LOB towards right. Good awareness of deficit but challenging for patient to correct. The patient requires inpatient medicine and rehabilitation evaluations and services for ongoing dysfunction secondary to right cerebellar infarct.    Review of Systems  Constitutional:  Negative for chills and fever.  HENT:  Positive for congestion. Negative for sore throat.   Eyes: Negative.   Respiratory:  Positive for cough and sputum production.   Cardiovascular: Negative.   Gastrointestinal: Negative.   Genitourinary: Negative.    Musculoskeletal: Negative.   Skin:  Negative for rash.  Neurological:  Positive for dizziness and weakness.  Psychiatric/Behavioral: Negative.          Past Medical History:  Diagnosis Date   CAD (coronary artery disease), native coronary artery      DES proximal LAD 07/2009; DES to RCA 04/2020   Chronic heart failure with preserved ejection fraction (HFpEF) (HCC)     Essential hypertension     Habitual alcohol use     Hyperlipidemia     PVC's (premature ventricular contractions)     Small cell lung cancer (HCC)      Right upper lobe 2008; no evidence of disease since treatment ended in 11/2007             Past Surgical History:  Procedure Laterality Date   CORONARY ATHERECTOMY N/A 04/26/2020    Procedure: CORONARY ATHERECTOMY;  Surgeon: Corky Crafts, MD;  Location: [facility] INVASIVE CV LAB;  Service: Cardiovascular;  Laterality: N/A;   CORONARY STENT INTERVENTION N/A 04/26/2020    Procedure: CORONARY STENT INTERVENTION;  Surgeon: Corky Crafts, MD;  Location: [facility] INVASIVE CV LAB;  Service: Cardiovascular;  Laterality: N/A;   CORONARY ULTRASOUND/IVUS N/A 04/26/2020    Procedure: Intravascular Ultrasound/IVUS;  Surgeon: Corky Crafts, MD;  Location: [facility] INVASIVE CV LAB;  Service: Cardiovascular;  Laterality: N/A;   Insertion of left subclavian Port-A-Cath   2008   LEFT HEART CATH AND CORONARY ANGIOGRAPHY N/A 07/03/2022    Procedure: LEFT HEART CATH AND CORONARY ANGIOGRAPHY;  Surgeon: [place]az[place], Peter M, MD;  Location: [facility] INVASIVE CV LAB;  Service: Cardiovascular;  Laterality: N/A;   RIGHT/LEFT HEART CATH AND  CORONARY ANGIOGRAPHY N/A 04/26/2020    Procedure: RIGHT/LEFT HEART CATH AND CORONARY ANGIOGRAPHY;  Surgeon: Corky Crafts, MD;  Location: [facility] INVASIVE CV LAB;  Service: Cardiovascular;  Laterality: N/A;   TEMPORARY PACEMAKER N/A 04/26/2020    Procedure: TEMPORARY PACEMAKER;  Surgeon: Corky Crafts, MD;  Location: [facility] INVASIVE CV LAB;  Service: Cardiovascular;   Laterality: N/A;   Video bronchoscopy and video mediastinoscopy   2008             Family History  Problem Relation Age of Onset   Cancer Father     CAD Brother     Hypertension Brother     Depression Maternal Uncle     Alcohol abuse Maternal Uncle          Social History:  reports that he quit smoking about 24 years ago. His smoking use included cigarettes. He started smoking about 56 years ago. He has never used smokeless tobacco. He reports current alcohol use of about 14.0 standard drinks of alcohol per week. He reports current drug use. Drug: Marijuana. Allergies:  Allergies       Allergies  Allergen Reactions   Cephalexin Anaphylaxis      throat swelling Patient said he can take amoxicillin   Amlodipine Swelling      Leg swelling            Medications Prior to Admission  Medication Sig Dispense Refill   acetaminophen (TYLENOL) 500 MG tablet Take 500 mg by mouth every 6 (six) hours as needed for moderate pain.       albuterol (VENTOLIN HFA) 108 (90 Base) MCG/ACT inhaler Inhale 2 puffs into the lungs every 6 (six) hours as needed for wheezing or shortness of breath.       Cholecalciferol (VITAMIN D) 50 MCG (2000 UT) tablet Take 2,000 Units by mouth daily.       clopidogrel (PLAVIX) 75 MG tablet Take 1 tablet (75 mg total) by mouth daily with breakfast. 90 tablet 3   diltiazem (CARDIZEM CD) 240 MG 24 hr capsule Take 240 mg by mouth daily.       empagliflozin (JARDIANCE) 10 MG TABS tablet Take 1 tablet (10 mg total) by mouth daily before breakfast. 30 tablet 11   escitalopram (LEXAPRO) 10 MG tablet Take 1 tablet (10 mg total) by mouth daily. 30 tablet 1   Fluticasone-Umeclidin-Vilant (TRELEGY ELLIPTA) 100-62.5-25 MCG/ACT AEPB Inhale 1 puff into the lungs daily. 180 each 3   gabapentin (NEURONTIN) 300 MG capsule Take 1 capsule (300 mg total) by mouth at bedtime. 90 capsule 3   metoprolol succinate (TOPROL-XL) 100 MG 24 hr tablet TAKE ONE TABLET (100MG  TOTAL) BY MOUTH DAILY.  TAKE WITH OR IMMEDIATELY FOLLOWING A MEAL. 90 tablet 2   nitroGLYCERIN (NITROSTAT) 0.4 MG SL tablet Place 1 tablet (0.4 mg total) under the tongue every 5 (five) minutes as needed for chest pain. 25 tablet 2   prochlorperazine (COMPAZINE) 5 MG tablet Take 1 tablet (5 mg total) by mouth every 6 (six) hours as needed for nausea or vomiting. 30 tablet 0   rosuvastatin (CRESTOR) 40 MG tablet TAKE ONE (1) TABLET BY MOUTH EVERY DAY 90 tablet 3   traZODone (DESYREL) 50 MG tablet Take 1 tablet (50 mg total) by mouth at bedtime. 90 tablet 1              Home: Home Living Family/patient expects to be discharged to:: Private residence Living Arrangements: Spouse/significant other Available Help at Discharge: Family Type  of Home: House Home Access: Level entry Home Layout: One level Bathroom Shower/Tub: Health visitor: Handicapped height Bathroom Accessibility: Yes Home Equipment: Information systems manager  Lives With: Spouse   Functional History: Prior Function Prior Level of Function : Independent/Modified Independent, Driving   Functional Status:  Mobility: Bed Mobility Overal bed mobility: Needs Assistance Bed Mobility: Supine to Sit Supine to sit: Supervision Sit to supine: Supervision General bed mobility comments: Improved control with transitions, minimal lean noted once EOB. Transfers Overall transfer level: Needs assistance Equipment used: Rolling walker (2 wheels) Transfers: Sit to/from Stand Sit to Stand: Contact guard assist General transfer comment: CGA for safety with sit to stand, notable Rt lean but self corrects, requires RW to stabilize. Ambulation/Gait Ambulation/Gait assistance: Mod assist Gait Distance (Feet): 65 Feet Assistive device: Rolling walker (2 wheels) Gait Pattern/deviations: Step-through pattern, Decreased step length - right, Decreased stance time - right, Decreased stride length, Decreased dorsiflexion - right, Decreased weight shift to left,  Ataxic, Scissoring, Staggering right, Drifts right/left, Narrow base of support, Knees buckling General Gait Details: Intermittent mod assist for balance due to LOB towards right. Frequent tactile, verbal and visual cues to orient to midline and adjust Rt lean. Cues for gait symmetry with larger Rt step length however ataxic. Cues for RW placement closer to BOS especially with turns, where LOB is most pronounced. Gait velocity: decr Gait velocity interpretation: <1.31 ft/sec, indicative of household ambulator Pre-gait activities: march in place BIL UE support min assist   ADL: ADL Overall ADL's : Needs assistance/impaired Eating/Feeding: Modified independent, Bed level Grooming: Set up, Sitting Upper Body Bathing: Set up, Sitting Lower Body Bathing: Moderate assistance, Sit to/from stand Upper Body Dressing : Set up, Sitting Lower Body Dressing: Sit to/from stand, Maximal assistance Toilet Transfer: Moderate assistance, Ambulation, Rolling walker (2 wheels) Functional mobility during ADLs: Moderate assistance, Rolling walker (2 wheels)   Cognition: Cognition Overall Cognitive Status: Within Functional Limits for tasks assessed Arousal/Alertness: Awake/alert Orientation Level: Oriented X4 Year: 2024 Day of Week: Correct Attention: Sustained Sustained Attention: Appears intact Memory: Impaired (2/5 words) Memory Impairment: Retrieval deficit Awareness: Appears intact Problem Solving: Appears intact Safety/Judgment: Appears intact Cognition Arousal: Alert Behavior During Therapy: WFL for tasks assessed/performed Overall Cognitive Status: Within Functional Limits for tasks assessed General Comments: WFL for basic tasks and money management   Physical Exam: Blood pressure 122/68, pulse 69, temperature 98 F (36.7 C), temperature source Oral, resp. rate 19, height 5\' 10"  (1.778 m), weight 97.6 kg, SpO2 96%. Physical Exam Constitutional:      General: He is not in acute  distress. HENT:     Head: Normocephalic and atraumatic.     Nose: Nose normal.     Mouth/Throat:     Mouth: Mucous membranes are moist.  Eyes:     Extraocular Movements: Extraocular movements intact.     Pupils: Pupils are equal, round, and reactive to light.  Cardiovascular:     Rate and Rhythm: Normal rate and regular rhythm.     Heart sounds: No murmur heard.    No gallop.  Pulmonary:     Effort: Pulmonary effort is normal. No respiratory distress.     Breath sounds: No stridor. Wheezing and rhonchi present. No rales.  Abdominal:     General: Bowel sounds are normal. There is no distension.     Palpations: Abdomen is soft. There is no mass.  Musculoskeletal:        General: No swelling or tenderness. Normal range of motion.  Cervical back: Normal range of motion.  Skin:    General: Skin is warm and dry.  Neurological:     Mental Status: He is alert.     Comments: Alert and oriented x 3. Normal insight and awareness. Intact Memory. Normal language and speech. Cranial nerve exam unremarkable. MMT: 5/5 LUE/LLE prox to distal. RUE/RLE 4+/5 prox to distal. Sensory exam normal for light touch and pain in all 4 limbs. Mild right-sided limb ataxia appreciated with FTN and HTS. No abnormal tone appreciated.    Psychiatric:        Mood and Affect: Mood normal.        Behavior: Behavior normal.        Lab Results Last 48 Hours  No results found for this or any previous visit (from the past 48 hour(s)).   Imaging Results (Last 48 hours)  No results found.         Blood pressure 122/68, pulse 69, temperature 98 F (36.7 C), temperature source Oral, resp. rate 19, height 5\' 10"  (1.778 m), weight 97.6 kg, SpO2 96%.   Medical Problem List and Plan: 1. Functional deficits secondary to right medial inferior cerebellar infarct.             -hx of gait disorder, ?NPH with shuffling, forward leaning gait but was distinctly different than what he's experienced after this stroke.              -patient may shower             -ELOS/Goals: 10-12 days, mod I goals with PT and OT   2.  Antithrombotics: -DVT/anticoagulation:  Pharmaceutical: Lovenox             -antiplatelet therapy: Aspirin and Plavix for 3 weeks then plavix alone long term given pt's hx of CAD (cards/neuro on board)   3. Pain Management: Tylenol as needed             -continue gabapentin 300 mg q HS   4. Mood/Behavior/Sleep: LCSW to evaluate and provide emotional support             -continue escitalopram 10 mg daily             -continue home trazodone 50 mg q HS             -antipsychotic agents: n/a              5. Neuropsych/cognition: This patient is capable of making decisions on his own behalf.   6. Skin/Wound Care: Routine skin care checks   7. Fluids/Electrolytes/Nutrition: Routine Is and Os and follow-up chemistries   8: Hypertension: monitor TID and prn             -continue metoprolol succinate 100 mg daily             -continue diltiazem 240 mg daily   9: Hyperlipidemia: continue statin   10: History of CAD; on aspirin and statin             -follow-up with Dr. Dietrich Pates   11: History lung cancer 2008   12: Asthmatic bronchitis/former smoker, slight exacerbation (at home uses Trelegy/Ventolin)             -continue Breo/Incruse inhalers             -add mucinex dm              -add prn duoneb             -  follow-up with Dr. Tonia Brooms   12: Gait disturbance: recent work-up as outpatient by Dr. Marjory Lies             -to re-schedule LP for findings of ventriculomegaly/NPH   14: Chronic HFpEF: check daily weights      Milinda Antis, PA-C 07/11/2023  I have personally performed a face to face diagnostic evaluation of this patient and formulated the key components of the plan.  Additionally, I have personally reviewed laboratory data, imaging studies, as well as relevant notes and concur with the physician assistant's documentation above.  The patient's status has not changed from  the original H&P.  Any changes in documentation from the acute care chart have been noted above.  Ranelle Oyster, MD, [place] [facility]

## 2023-07-13 NOTE — Discharge Summary (Signed)
Physician Discharge Summary  Jeremy Lopez YNW:[id number] DOB: 03-29-1951 DOA: 07/05/2023  PCP: [name], MD  Admit date: 07/05/2023 Discharge date: 07/13/2023  Time spent: 40 minutes  Recommendations for Outpatient Follow-up:  Follow outpatient CBC/CMP  Follow with neurology outpatient - for stroke follow up Follow with neurology outpatient after DAPT - to arrange LP for NPH workup Orthostatic hypotension - holding diltiazem, metoprolol dose reduced - follow in CIR   Discharge Diagnoses:  Principal Problem:   CVA (cerebral vascular accident) [facility]) Active Problems:   Mixed hyperlipidemia   Essential hypertension   CAD (coronary artery disease), native coronary artery   Headache   Cerebellar stroke, acute (HCC)   Dizziness   Discharge Condition: stable  Diet recommendation: heart healthy  Filed Weights   07/05/23 1507 07/05/23 2330  Weight: 97.7 kg 97.6 kg    History of present illness:   Jeremy Lopez is Jeremy Lopez 72 y.o. male with past medical history significant for essential hypertension, CAD s/p PCI/DES, HLD, major depressive disorder, Hx lung cancer s/p chemotherapy/radiation, asthmatic bronchitis, EtOH use, former tobacco abuse who presented to [facility] ED on 9/27 via EMS for right-sided weakness, headache, disequilibrium.  Also endorsed paresthesias to right side of face and bilateral upper extremities that subsided in 10 minutes.  Initial symptoms onset at 4 AM in which he was feeling fine at 2 AM.  Given patient having significant trouble getting in downstairs, EMS was activated and patient was brought to ED for further evaluation.   In the ED, temperature 97.8 F, HR 75, RR 14, BP 158/74, SpO2 94% on room air.  WBC 10.8, hemoglobin 14.6, platelet count 339.  Sodium 134, potassium 3.6, chloride 101, CO2 22, glucose 110, BUN 16, creat 1.06.  AST 12, ALT 15, total bili Jeremy Lopez 0.8.  Total cholesterol 107, LDL 38, HDL 48.  Urinalysis unrevealing.  UDS negative.   EtOH level less than 10.  CT head without contrast with chronic ventriculomegaly, stable since recent MRI.  CT angiogram head/neck with no large vessel occlusion.  MRI brain without contrast with early subacute versus acute infarct medial inferior right cerebellum.  EKG with NSR.  Neurology was consulted, recommended transfer to [facility] for continued stroke evaluation.  TRH consulted for admission and patient was transferred to [facility].  Stable for discharge to CIR today.  See below for additional details.  Hospital Course:  Assessment and Plan:  Acute ischemic cerebellar CVA Patient presenting to ED with disequilibrium, right sided weakness with paresthesias.  Last known normal 2 AM on 07/06/2023.  CT head with noted chronic ventriculomegaly.  CT angiogram head/neck with no large vessel occlusion.  MRI brain without contrast with acute versus early subacute infarct medial inferior right cerebellum.  LDL 34, hemoglobin A1c 5.7.  TTE with LVEF 60 to 65%, mild LVH, grade 1 diastolic dysfunction, IVC normal in size.  Seen by neurology with recommendation of aspirin and Plavix x 3 weeks followed by aspirin alone -> patient was recommended to be on plavix monotherapy per cardiology for his CAD (cath 2021 noted consider plavix monotherapy given diffuse disease) -> discussed with neurology 10/5  -> will discharge with plan for DAPT x3 weeks followed by plavix alone given this.  Seen by PT/OT with recommendation of CIR placement.  Outpatient follow-up with neurology 3-4 weeks. -- Pending CIR placement; medically stable for discharge once bed available and receives insurance authorization   Orthostasis Stop diltiazem, reduce metoprolol Thigh high compression stockings Trend -  as: CRESTOR TAKE ONE (1) TABLET BY MOUTH EVERY DAY   traZODone 50 MG tablet Commonly known as: DESYREL Take 1 tablet (50 mg total) by mouth at bedtime.   Trelegy Ellipta 100-62.5-25 MCG/ACT Aepb Generic drug: Fluticasone-Umeclidin-Vilant Inhale 1 puff into the lungs daily.   Vitamin D 50 MCG (2000 UT) tablet Take 2,000 Units by mouth daily.       Allergies  Allergen Reactions   Cephalexin Anaphylaxis    throat swelling Patient said he can take amoxicillin   Amlodipine Swelling    Leg swelling    Follow-up Information     [name], MD. Schedule an appointment as soon as possible for Jeremy Lopez visit .   Specialty: Internal Medicine Contact information: [address] [place] [address] [phone]         Scarlette Ar, MD. Schedule an appointment as soon as possible for Jeremy Lopez visit .   Specialty: Otolaryngology Contact information: [address] [place] [address] [phone]         Suanne Marker, MD. Schedule an appointment as soon as possible for Jeremy Lopez visit in 1 month(s).   Specialties: Neurology, Radiology Contact information: [address] [place] [address] [phone]                  The results of significant diagnostics from this  hospitalization (including imaging, microbiology, ancillary and laboratory) are listed below for reference.    Significant Diagnostic Studies: ECHOCARDIOGRAM COMPLETE  Result Date: 07/07/2023    ECHOCARDIOGRAM REPORT   Patient Name:   Jeremy Lopez Date of Exam: 07/07/2023 Medical Rec #:  [id number]      Height:       70.0 in Accession #:    [id number]     Weight:       215.2 lb Date of Birth:  26-May-1951      BSA:          2.153 m Patient Age:    72 years       BP:           149/81 mmHg Patient Gender: M              HR:           84 bpm. Exam Location:  Inpatient Procedure: 2D Echo, Color Doppler and Cardiac Doppler Indications:    Stroke  History:        Patient has prior history of Echocardiogram examinations, most                 recent 06/14/2022. HFpEF, CAD, Arrythmias:PVC; Risk                 Factors:Dyslipidemia and Hypertension. Lung CA.  Sonographer:    Milda Smart Referring Phys: [id number] ASIA B ZIERLE-GHOSH IMPRESSIONS  1. Left ventricular ejection fraction, by estimation, is 60 to 65%. The left ventricle has normal function. The left ventricle has no regional wall motion abnormalities. There is mild left ventricular hypertrophy. Left ventricular diastolic parameters are consistent with Grade I diastolic dysfunction (impaired relaxation).  2. Right ventricular systolic function is normal. The right ventricular size is normal. Tricuspid regurgitation signal is inadequate for assessing PA pressure.  3. The mitral valve is normal in structure. No evidence of mitral valve regurgitation.  4. The aortic valve is tricuspid. Aortic valve regurgitation is not visualized. Aortic valve sclerosis is present, with no evidence of aortic valve stenosis.  5. The inferior  Physician Discharge Summary  Jeremy Lopez YNW:[id number] DOB: 03-29-1951 DOA: 07/05/2023  PCP: [name], MD  Admit date: 07/05/2023 Discharge date: 07/13/2023  Time spent: 40 minutes  Recommendations for Outpatient Follow-up:  Follow outpatient CBC/CMP  Follow with neurology outpatient - for stroke follow up Follow with neurology outpatient after DAPT - to arrange LP for NPH workup Orthostatic hypotension - holding diltiazem, metoprolol dose reduced - follow in CIR   Discharge Diagnoses:  Principal Problem:   CVA (cerebral vascular accident) [facility]) Active Problems:   Mixed hyperlipidemia   Essential hypertension   CAD (coronary artery disease), native coronary artery   Headache   Cerebellar stroke, acute (HCC)   Dizziness   Discharge Condition: stable  Diet recommendation: heart healthy  Filed Weights   07/05/23 1507 07/05/23 2330  Weight: 97.7 kg 97.6 kg    History of present illness:   Jeremy Lopez is Jeremy Lopez 72 y.o. male with past medical history significant for essential hypertension, CAD s/p PCI/DES, HLD, major depressive disorder, Hx lung cancer s/p chemotherapy/radiation, asthmatic bronchitis, EtOH use, former tobacco abuse who presented to [facility] ED on 9/27 via EMS for right-sided weakness, headache, disequilibrium.  Also endorsed paresthesias to right side of face and bilateral upper extremities that subsided in 10 minutes.  Initial symptoms onset at 4 AM in which he was feeling fine at 2 AM.  Given patient having significant trouble getting in downstairs, EMS was activated and patient was brought to ED for further evaluation.   In the ED, temperature 97.8 F, HR 75, RR 14, BP 158/74, SpO2 94% on room air.  WBC 10.8, hemoglobin 14.6, platelet count 339.  Sodium 134, potassium 3.6, chloride 101, CO2 22, glucose 110, BUN 16, creat 1.06.  AST 12, ALT 15, total bili Jeremy Lopez 0.8.  Total cholesterol 107, LDL 38, HDL 48.  Urinalysis unrevealing.  UDS negative.   EtOH level less than 10.  CT head without contrast with chronic ventriculomegaly, stable since recent MRI.  CT angiogram head/neck with no large vessel occlusion.  MRI brain without contrast with early subacute versus acute infarct medial inferior right cerebellum.  EKG with NSR.  Neurology was consulted, recommended transfer to [facility] for continued stroke evaluation.  TRH consulted for admission and patient was transferred to [facility].  Stable for discharge to CIR today.  See below for additional details.  Hospital Course:  Assessment and Plan:  Acute ischemic cerebellar CVA Patient presenting to ED with disequilibrium, right sided weakness with paresthesias.  Last known normal 2 AM on 07/06/2023.  CT head with noted chronic ventriculomegaly.  CT angiogram head/neck with no large vessel occlusion.  MRI brain without contrast with acute versus early subacute infarct medial inferior right cerebellum.  LDL 34, hemoglobin A1c 5.7.  TTE with LVEF 60 to 65%, mild LVH, grade 1 diastolic dysfunction, IVC normal in size.  Seen by neurology with recommendation of aspirin and Plavix x 3 weeks followed by aspirin alone -> patient was recommended to be on plavix monotherapy per cardiology for his CAD (cath 2021 noted consider plavix monotherapy given diffuse disease) -> discussed with neurology 10/5  -> will discharge with plan for DAPT x3 weeks followed by plavix alone given this.  Seen by PT/OT with recommendation of CIR placement.  Outpatient follow-up with neurology 3-4 weeks. -- Pending CIR placement; medically stable for discharge once bed available and receives insurance authorization   Orthostasis Stop diltiazem, reduce metoprolol Thigh high compression stockings Trend -  Physician Discharge Summary  Jeremy Lopez YNW:[id number] DOB: 03-29-1951 DOA: 07/05/2023  PCP: [name], MD  Admit date: 07/05/2023 Discharge date: 07/13/2023  Time spent: 40 minutes  Recommendations for Outpatient Follow-up:  Follow outpatient CBC/CMP  Follow with neurology outpatient - for stroke follow up Follow with neurology outpatient after DAPT - to arrange LP for NPH workup Orthostatic hypotension - holding diltiazem, metoprolol dose reduced - follow in CIR   Discharge Diagnoses:  Principal Problem:   CVA (cerebral vascular accident) [facility]) Active Problems:   Mixed hyperlipidemia   Essential hypertension   CAD (coronary artery disease), native coronary artery   Headache   Cerebellar stroke, acute (HCC)   Dizziness   Discharge Condition: stable  Diet recommendation: heart healthy  Filed Weights   07/05/23 1507 07/05/23 2330  Weight: 97.7 kg 97.6 kg    History of present illness:   Jeremy Lopez is Jeremy Lopez 72 y.o. male with past medical history significant for essential hypertension, CAD s/p PCI/DES, HLD, major depressive disorder, Hx lung cancer s/p chemotherapy/radiation, asthmatic bronchitis, EtOH use, former tobacco abuse who presented to [facility] ED on 9/27 via EMS for right-sided weakness, headache, disequilibrium.  Also endorsed paresthesias to right side of face and bilateral upper extremities that subsided in 10 minutes.  Initial symptoms onset at 4 AM in which he was feeling fine at 2 AM.  Given patient having significant trouble getting in downstairs, EMS was activated and patient was brought to ED for further evaluation.   In the ED, temperature 97.8 F, HR 75, RR 14, BP 158/74, SpO2 94% on room air.  WBC 10.8, hemoglobin 14.6, platelet count 339.  Sodium 134, potassium 3.6, chloride 101, CO2 22, glucose 110, BUN 16, creat 1.06.  AST 12, ALT 15, total bili Jeremy Lopez 0.8.  Total cholesterol 107, LDL 38, HDL 48.  Urinalysis unrevealing.  UDS negative.   EtOH level less than 10.  CT head without contrast with chronic ventriculomegaly, stable since recent MRI.  CT angiogram head/neck with no large vessel occlusion.  MRI brain without contrast with early subacute versus acute infarct medial inferior right cerebellum.  EKG with NSR.  Neurology was consulted, recommended transfer to [facility] for continued stroke evaluation.  TRH consulted for admission and patient was transferred to [facility].  Stable for discharge to CIR today.  See below for additional details.  Hospital Course:  Assessment and Plan:  Acute ischemic cerebellar CVA Patient presenting to ED with disequilibrium, right sided weakness with paresthesias.  Last known normal 2 AM on 07/06/2023.  CT head with noted chronic ventriculomegaly.  CT angiogram head/neck with no large vessel occlusion.  MRI brain without contrast with acute versus early subacute infarct medial inferior right cerebellum.  LDL 34, hemoglobin A1c 5.7.  TTE with LVEF 60 to 65%, mild LVH, grade 1 diastolic dysfunction, IVC normal in size.  Seen by neurology with recommendation of aspirin and Plavix x 3 weeks followed by aspirin alone -> patient was recommended to be on plavix monotherapy per cardiology for his CAD (cath 2021 noted consider plavix monotherapy given diffuse disease) -> discussed with neurology 10/5  -> will discharge with plan for DAPT x3 weeks followed by plavix alone given this.  Seen by PT/OT with recommendation of CIR placement.  Outpatient follow-up with neurology 3-4 weeks. -- Pending CIR placement; medically stable for discharge once bed available and receives insurance authorization   Orthostasis Stop diltiazem, reduce metoprolol Thigh high compression stockings Trend -  as: CRESTOR TAKE ONE (1) TABLET BY MOUTH EVERY DAY   traZODone 50 MG tablet Commonly known as: DESYREL Take 1 tablet (50 mg total) by mouth at bedtime.   Trelegy Ellipta 100-62.5-25 MCG/ACT Aepb Generic drug: Fluticasone-Umeclidin-Vilant Inhale 1 puff into the lungs daily.   Vitamin D 50 MCG (2000 UT) tablet Take 2,000 Units by mouth daily.       Allergies  Allergen Reactions   Cephalexin Anaphylaxis    throat swelling Patient said he can take amoxicillin   Amlodipine Swelling    Leg swelling    Follow-up Information     [name], MD. Schedule an appointment as soon as possible for Jeremy Lopez visit .   Specialty: Internal Medicine Contact information: [address] [place] [address] [phone]         Scarlette Ar, MD. Schedule an appointment as soon as possible for Jeremy Lopez visit .   Specialty: Otolaryngology Contact information: [address] [place] [address] [phone]         Suanne Marker, MD. Schedule an appointment as soon as possible for Jeremy Lopez visit in 1 month(s).   Specialties: Neurology, Radiology Contact information: [address] [place] [address] [phone]                  The results of significant diagnostics from this  hospitalization (including imaging, microbiology, ancillary and laboratory) are listed below for reference.    Significant Diagnostic Studies: ECHOCARDIOGRAM COMPLETE  Result Date: 07/07/2023    ECHOCARDIOGRAM REPORT   Patient Name:   Jeremy Lopez Date of Exam: 07/07/2023 Medical Rec #:  [id number]      Height:       70.0 in Accession #:    [id number]     Weight:       215.2 lb Date of Birth:  26-May-1951      BSA:          2.153 m Patient Age:    72 years       BP:           149/81 mmHg Patient Gender: M              HR:           84 bpm. Exam Location:  Inpatient Procedure: 2D Echo, Color Doppler and Cardiac Doppler Indications:    Stroke  History:        Patient has prior history of Echocardiogram examinations, most                 recent 06/14/2022. HFpEF, CAD, Arrythmias:PVC; Risk                 Factors:Dyslipidemia and Hypertension. Lung CA.  Sonographer:    Milda Smart Referring Phys: [id number] ASIA B ZIERLE-GHOSH IMPRESSIONS  1. Left ventricular ejection fraction, by estimation, is 60 to 65%. The left ventricle has normal function. The left ventricle has no regional wall motion abnormalities. There is mild left ventricular hypertrophy. Left ventricular diastolic parameters are consistent with Grade I diastolic dysfunction (impaired relaxation).  2. Right ventricular systolic function is normal. The right ventricular size is normal. Tricuspid regurgitation signal is inadequate for assessing PA pressure.  3. The mitral valve is normal in structure. No evidence of mitral valve regurgitation.  4. The aortic valve is tricuspid. Aortic valve regurgitation is not visualized. Aortic valve sclerosis is present, with no evidence of aortic valve stenosis.  5. The inferior  Physician Discharge Summary  Jeremy Lopez YNW:[id number] DOB: 03-29-1951 DOA: 07/05/2023  PCP: [name], MD  Admit date: 07/05/2023 Discharge date: 07/13/2023  Time spent: 40 minutes  Recommendations for Outpatient Follow-up:  Follow outpatient CBC/CMP  Follow with neurology outpatient - for stroke follow up Follow with neurology outpatient after DAPT - to arrange LP for NPH workup Orthostatic hypotension - holding diltiazem, metoprolol dose reduced - follow in CIR   Discharge Diagnoses:  Principal Problem:   CVA (cerebral vascular accident) [facility]) Active Problems:   Mixed hyperlipidemia   Essential hypertension   CAD (coronary artery disease), native coronary artery   Headache   Cerebellar stroke, acute (HCC)   Dizziness   Discharge Condition: stable  Diet recommendation: heart healthy  Filed Weights   07/05/23 1507 07/05/23 2330  Weight: 97.7 kg 97.6 kg    History of present illness:   Jeremy Lopez is Jeremy Lopez 72 y.o. male with past medical history significant for essential hypertension, CAD s/p PCI/DES, HLD, major depressive disorder, Hx lung cancer s/p chemotherapy/radiation, asthmatic bronchitis, EtOH use, former tobacco abuse who presented to [facility] ED on 9/27 via EMS for right-sided weakness, headache, disequilibrium.  Also endorsed paresthesias to right side of face and bilateral upper extremities that subsided in 10 minutes.  Initial symptoms onset at 4 AM in which he was feeling fine at 2 AM.  Given patient having significant trouble getting in downstairs, EMS was activated and patient was brought to ED for further evaluation.   In the ED, temperature 97.8 F, HR 75, RR 14, BP 158/74, SpO2 94% on room air.  WBC 10.8, hemoglobin 14.6, platelet count 339.  Sodium 134, potassium 3.6, chloride 101, CO2 22, glucose 110, BUN 16, creat 1.06.  AST 12, ALT 15, total bili Jeremy Lopez 0.8.  Total cholesterol 107, LDL 38, HDL 48.  Urinalysis unrevealing.  UDS negative.   EtOH level less than 10.  CT head without contrast with chronic ventriculomegaly, stable since recent MRI.  CT angiogram head/neck with no large vessel occlusion.  MRI brain without contrast with early subacute versus acute infarct medial inferior right cerebellum.  EKG with NSR.  Neurology was consulted, recommended transfer to [facility] for continued stroke evaluation.  TRH consulted for admission and patient was transferred to [facility].  Stable for discharge to CIR today.  See below for additional details.  Hospital Course:  Assessment and Plan:  Acute ischemic cerebellar CVA Patient presenting to ED with disequilibrium, right sided weakness with paresthesias.  Last known normal 2 AM on 07/06/2023.  CT head with noted chronic ventriculomegaly.  CT angiogram head/neck with no large vessel occlusion.  MRI brain without contrast with acute versus early subacute infarct medial inferior right cerebellum.  LDL 34, hemoglobin A1c 5.7.  TTE with LVEF 60 to 65%, mild LVH, grade 1 diastolic dysfunction, IVC normal in size.  Seen by neurology with recommendation of aspirin and Plavix x 3 weeks followed by aspirin alone -> patient was recommended to be on plavix monotherapy per cardiology for his CAD (cath 2021 noted consider plavix monotherapy given diffuse disease) -> discussed with neurology 10/5  -> will discharge with plan for DAPT x3 weeks followed by plavix alone given this.  Seen by PT/OT with recommendation of CIR placement.  Outpatient follow-up with neurology 3-4 weeks. -- Pending CIR placement; medically stable for discharge once bed available and receives insurance authorization   Orthostasis Stop diltiazem, reduce metoprolol Thigh high compression stockings Trend -  as: CRESTOR TAKE ONE (1) TABLET BY MOUTH EVERY DAY   traZODone 50 MG tablet Commonly known as: DESYREL Take 1 tablet (50 mg total) by mouth at bedtime.   Trelegy Ellipta 100-62.5-25 MCG/ACT Aepb Generic drug: Fluticasone-Umeclidin-Vilant Inhale 1 puff into the lungs daily.   Vitamin D 50 MCG (2000 UT) tablet Take 2,000 Units by mouth daily.       Allergies  Allergen Reactions   Cephalexin Anaphylaxis    throat swelling Patient said he can take amoxicillin   Amlodipine Swelling    Leg swelling    Follow-up Information     [name], MD. Schedule an appointment as soon as possible for Jeremy Lopez visit .   Specialty: Internal Medicine Contact information: [address] [place] [address] [phone]         Scarlette Ar, MD. Schedule an appointment as soon as possible for Jeremy Lopez visit .   Specialty: Otolaryngology Contact information: [address] [place] [address] [phone]         Suanne Marker, MD. Schedule an appointment as soon as possible for Jeremy Lopez visit in 1 month(s).   Specialties: Neurology, Radiology Contact information: [address] [place] [address] [phone]                  The results of significant diagnostics from this  hospitalization (including imaging, microbiology, ancillary and laboratory) are listed below for reference.    Significant Diagnostic Studies: ECHOCARDIOGRAM COMPLETE  Result Date: 07/07/2023    ECHOCARDIOGRAM REPORT   Patient Name:   Jeremy Lopez Date of Exam: 07/07/2023 Medical Rec #:  [id number]      Height:       70.0 in Accession #:    [id number]     Weight:       215.2 lb Date of Birth:  26-May-1951      BSA:          2.153 m Patient Age:    72 years       BP:           149/81 mmHg Patient Gender: M              HR:           84 bpm. Exam Location:  Inpatient Procedure: 2D Echo, Color Doppler and Cardiac Doppler Indications:    Stroke  History:        Patient has prior history of Echocardiogram examinations, most                 recent 06/14/2022. HFpEF, CAD, Arrythmias:PVC; Risk                 Factors:Dyslipidemia and Hypertension. Lung CA.  Sonographer:    Milda Smart Referring Phys: [id number] ASIA B ZIERLE-GHOSH IMPRESSIONS  1. Left ventricular ejection fraction, by estimation, is 60 to 65%. The left ventricle has normal function. The left ventricle has no regional wall motion abnormalities. There is mild left ventricular hypertrophy. Left ventricular diastolic parameters are consistent with Grade I diastolic dysfunction (impaired relaxation).  2. Right ventricular systolic function is normal. The right ventricular size is normal. Tricuspid regurgitation signal is inadequate for assessing PA pressure.  3. The mitral valve is normal in structure. No evidence of mitral valve regurgitation.  4. The aortic valve is tricuspid. Aortic valve regurgitation is not visualized. Aortic valve sclerosis is present, with no evidence of aortic valve stenosis.  5. The inferior

## 2023-07-14 DIAGNOSIS — I10 Essential (primary) hypertension: Secondary | ICD-10-CM | POA: Diagnosis not present

## 2023-07-14 DIAGNOSIS — I639 Cerebral infarction, unspecified: Secondary | ICD-10-CM | POA: Diagnosis not present

## 2023-07-14 DIAGNOSIS — G912 (Idiopathic) normal pressure hydrocephalus: Secondary | ICD-10-CM

## 2023-07-14 DIAGNOSIS — J411 Mucopurulent chronic bronchitis: Secondary | ICD-10-CM | POA: Diagnosis not present

## 2023-07-14 NOTE — Progress Notes (Signed)
Signed     Expand All Collapse All          Physical Medicine and Rehabilitation Consult Reason for Consult:functional deficits after stroke Referring Physician: [place]     HPI: Jeremy Lopez is a 72 y.o. male with a history of hypertension, CAD, depression, lung cancer status post chemotherapy and radiation who presented to [facility] on 927 with right-sided weakness and loss of balance as well as headaches.  Initial CT did not show acute changes but MRI of the brain demonstrated acute/subacute medial inferior right cerebellum infarct.  Patient was placed on aspirin and Plavix.  Ventriculomegaly on imaging is chronic and was being followed by neurology as an outpatient with the question of normal pressure hydrocephalus.  Plan is for LP after 3 weeks of DAPT.  Patient was up with therapy over the weekend and was min assist for sit to stand transfers and min assist for 65 feet with ataxic, scissoring, staggering gait often drifting to the right.  Patient lives in a 1 level home with his wife with level entry.  The patient was modified independent and driving prior to his admission.     Review of Systems  Constitutional:  Negative for chills and fever.  HENT: Negative.    Eyes: Negative.   Respiratory: Negative.    Cardiovascular: Negative.   Gastrointestinal:  Positive for nausea.  Genitourinary:  Negative for dysuria.  Musculoskeletal: Negative.   Skin: Negative.   Neurological:  Positive for dizziness, tremors and weakness.        Past Medical History:  Diagnosis Date   CAD (coronary artery disease), native coronary artery      DES proximal LAD 07/2009; DES to RCA 04/2020   Chronic heart failure with preserved ejection fraction (HFpEF) (HCC)     Essential hypertension     Habitual alcohol use     Hyperlipidemia     PVC's (premature ventricular contractions)     Small cell lung cancer (HCC)      Right upper lobe 2008; no evidence of disease since treatment ended in  11/2007             Past Surgical History:  Procedure Laterality Date   CORONARY ATHERECTOMY N/A 04/26/2020    Procedure: CORONARY ATHERECTOMY;  Surgeon: Corky Crafts, MD;  Location: [facility] INVASIVE CV LAB;  Service: Cardiovascular;  Laterality: N/A;   CORONARY STENT INTERVENTION N/A 04/26/2020    Procedure: CORONARY STENT INTERVENTION;  Surgeon: Corky Crafts, MD;  Location: [facility] INVASIVE CV LAB;  Service: Cardiovascular;  Laterality: N/A;   CORONARY ULTRASOUND/IVUS N/A 04/26/2020    Procedure: Intravascular Ultrasound/IVUS;  Surgeon: Corky Crafts, MD;  Location: [facility] INVASIVE CV LAB;  Service: Cardiovascular;  Laterality: N/A;   Insertion of left subclavian Port-A-Cath   2008   LEFT HEART CATH AND CORONARY ANGIOGRAPHY N/A 07/03/2022    Procedure: LEFT HEART CATH AND CORONARY ANGIOGRAPHY;  Surgeon: [place], Peter M, MD;  Location: [facility] INVASIVE CV LAB;  Service: Cardiovascular;  Laterality: N/A;   RIGHT/LEFT HEART CATH AND CORONARY ANGIOGRAPHY N/A 04/26/2020    Procedure: RIGHT/LEFT HEART CATH AND CORONARY ANGIOGRAPHY;  Surgeon: Corky Crafts, MD;  Location: [facility] INVASIVE CV LAB;  Service: Cardiovascular;  Laterality: N/A;   TEMPORARY PACEMAKER N/A 04/26/2020    Procedure: TEMPORARY PACEMAKER;  Surgeon: Corky Crafts, MD;  Location: [facility] INVASIVE CV LAB;  Service: Cardiovascular;  Laterality: N/A;   Video bronchoscopy and video mediastinoscopy   2008  Family History  Problem Relation Age of Onset   Cancer Father     CAD Brother     Hypertension Brother     Depression Maternal Uncle     Alcohol abuse Maternal Uncle          Social History:  reports that he quit smoking about 24 years ago. His smoking use included cigarettes. He started smoking about 56 years ago. He has never used smokeless tobacco. He reports current alcohol use of about 14.0 standard drinks of alcohol per week. He reports current drug use. Drug: Marijuana. Allergies:  Allergies        Allergies  Allergen Reactions   Cephalexin Anaphylaxis      throat swelling Patient said he can take amoxicillin   Amlodipine Swelling      Leg swelling            Medications Prior to Admission  Medication Sig Dispense Refill   acetaminophen (TYLENOL) 500 MG tablet Take 500 mg by mouth every 6 (six) hours as needed for moderate pain.       albuterol (VENTOLIN HFA) 108 (90 Base) MCG/ACT inhaler Inhale 2 puffs into the lungs every 6 (six) hours as needed for wheezing or shortness of breath.       Cholecalciferol (VITAMIN D) 50 MCG (2000 UT) tablet Take 2,000 Units by mouth daily.       clopidogrel (PLAVIX) 75 MG tablet Take 1 tablet (75 mg total) by mouth daily with breakfast. 90 tablet 3   diltiazem (CARDIZEM CD) 240 MG 24 hr capsule Take 240 mg by mouth daily.       empagliflozin (JARDIANCE) 10 MG TABS tablet Take 1 tablet (10 mg total) by mouth daily before breakfast. 30 tablet 11   escitalopram (LEXAPRO) 10 MG tablet Take 1 tablet (10 mg total) by mouth daily. 30 tablet 1   Fluticasone-Umeclidin-Vilant (TRELEGY ELLIPTA) 100-62.5-25 MCG/ACT AEPB Inhale 1 puff into the lungs daily. 180 each 3   gabapentin (NEURONTIN) 300 MG capsule Take 1 capsule (300 mg total) by mouth at bedtime. 90 capsule 3   metoprolol succinate (TOPROL-XL) 100 MG 24 hr tablet TAKE ONE TABLET (100MG  TOTAL) BY MOUTH DAILY. TAKE WITH OR IMMEDIATELY FOLLOWING A MEAL. 90 tablet 2   nitroGLYCERIN (NITROSTAT) 0.4 MG SL tablet Place 1 tablet (0.4 mg total) under the tongue every 5 (five) minutes as needed for chest pain. 25 tablet 2   prochlorperazine (COMPAZINE) 5 MG tablet Take 1 tablet (5 mg total) by mouth every 6 (six) hours as needed for nausea or vomiting. 30 tablet 0   rosuvastatin (CRESTOR) 40 MG tablet TAKE ONE (1) TABLET BY MOUTH EVERY DAY 90 tablet 3   traZODone (DESYREL) 50 MG tablet Take 1 tablet (50 mg total) by mouth at bedtime. 90 tablet 1          Home: Home Living Family/patient expects to be  discharged to:: Private residence Living Arrangements: Spouse/significant other Available Help at Discharge: Family Type of Home: House Home Access: Level entry Home Layout: One level Bathroom Shower/Tub: Health visitor: Handicapped height Home Equipment: Banker History: Prior Function Prior Level of Function : Independent/Modified Independent, Driving Functional Status:  Mobility: Bed Mobility Overal bed mobility: Needs Assistance Bed Mobility: Supine to Sit Supine to sit: Contact guard, Used rails, HOB elevated General bed mobility comments: for safety, slower, efforful, use of rail. Transfers Overall transfer level: Needs assistance Equipment used: Rolling walker (2 wheels) Transfers: Sit to/from Stand  Sit to Stand: Min assist, +2 safety/equipment General transfer comment: Min assist for balance, good power-up, cues for hand placement and awareness of right lean. Good coordinated control with descent into recliner but reports feeling off center. Ambulation/Gait Ambulation/Gait assistance: Min assist, +2 physical assistance, +2 safety/equipment Gait Distance (Feet): 65 Feet Assistive device: Rolling walker (2 wheels) Gait Pattern/deviations: Step-through pattern, Decreased step length - right, Decreased stance time - right, Decreased stride length, Decreased dorsiflexion - right, Decreased weight shift to left, Shuffle, Ataxic, Scissoring, Staggering right, Drifts right/left, Narrow base of support General Gait Details: Min assist +2 at all times due to right lean and ataxic gait with RLE intermittently adduction into midline and stumbling. With extensive cues and education, pt able to slow gait, widen BOS, and demonstrate fair improvement in step symmetry about 50% of the time, but still needs assist to correct Rt sided lean. Performed with use of RW. Gait velocity: decr Gait velocity interpretation: <1.31 ft/sec, indicative of household  ambulator Pre-gait activities: weight shift lateral, a/p and progressed to marching in place with bil > SL UE support (drifting Rt.)   ADL: ADL Overall ADL's : Needs assistance/impaired Eating/Feeding: Modified independent, Bed level Grooming: Set up, Sitting Upper Body Bathing: Set up, Sitting Lower Body Bathing: Moderate assistance, Sit to/from stand Upper Body Dressing : Set up, Sitting Lower Body Dressing: Sit to/from stand, Maximal assistance Toilet Transfer: Moderate assistance, Ambulation, Rolling walker (2 wheels) Functional mobility during ADLs: Moderate assistance, Rolling walker (2 wheels)   Cognition: Cognition Overall Cognitive Status: Within Functional Limits for tasks assessed Orientation Level: Oriented X4 Cognition Arousal: Alert Behavior During Therapy: WFL for tasks assessed/performed Overall Cognitive Status: Within Functional Limits for tasks assessed General Comments: WFL for basic tasks and money management   Blood pressure 126/75, pulse 94, temperature 98.2 F (36.8 C), temperature source Oral, resp. rate 17, height 5\' 10"  (1.778 m), weight 97.6 kg, SpO2 94%. Physical Exam Constitutional:      Appearance: He is obese.  HENT:     Head: Normocephalic.     Right Ear: External ear normal.     Left Ear: External ear normal.     Nose: Nose normal.     Mouth/Throat:     Mouth: Mucous membranes are moist.  Eyes:     Conjunctiva/sclera: Conjunctivae normal.     Comments: Mild right lid ptosis  Cardiovascular:     Rate and Rhythm: Normal rate.  Pulmonary:     Effort: Pulmonary effort is normal.  Abdominal:     Palpations: Abdomen is soft.  Musculoskeletal:        General: No swelling. Normal range of motion.     Cervical back: Normal range of motion.  Skin:    General: Skin is warm.  Neurological:     Mental Status: He is alert.     Comments: Alert and oriented x 3. Normal insight and awareness. Intact Memory. Normal language and speech. Cranial  nerve exam unremarkable. Mild right lid ptosis. MMT: 4+/5 RUE 5/5 LUE, BLE 4/5 prox to 4+/5 distally. No focal sensory findings. Mild intentional tremor. No focal limb ataxia, no nystagmus. No abnormal muscle tone. DTR's 1+.    Psychiatric:        Mood and Affect: Mood normal.        Behavior: Behavior normal.        Lab Results Last 24 Hours  No results found for this or any previous visit (from the past 24 hour(s)).    Imaging  Results (Last 48 hours)  ECHOCARDIOGRAM COMPLETE   Result Date: 07/07/2023    ECHOCARDIOGRAM REPORT   Patient Name:   Jeremy Lopez Date of Exam: 07/07/2023 Medical Rec #:  [id number]      Height:       70.0 in Accession #:    [id number]     Weight:       215.2 lb Date of Birth:  08-31-1951      BSA:          2.153 m Patient Age:    72 years       BP:           149/81 mmHg Patient Gender: M              HR:           84 bpm. Exam Location:  Inpatient Procedure: 2D Echo, Color Doppler and Cardiac Doppler Indications:    Stroke  History:        Patient has prior history of Echocardiogram examinations, most                 recent 06/14/2022. HFpEF, CAD, Arrythmias:PVC; Risk                 Factors:Dyslipidemia and Hypertension. Lung CA.  Sonographer:    Milda Smart Referring Phys: [id number] ASIA B ZIERLE-GHOSH IMPRESSIONS  1. Left ventricular ejection fraction, by estimation, is 60 to 65%. The left ventricle has normal function. The left ventricle has no regional wall motion abnormalities. There is mild left ventricular hypertrophy. Left ventricular diastolic parameters are consistent with Grade I diastolic dysfunction (impaired relaxation).  2. Right ventricular systolic function is normal. The right ventricular size is normal. Tricuspid regurgitation signal is inadequate for assessing PA pressure.  3. The mitral valve is normal in structure. No evidence of mitral valve regurgitation.  4. The aortic valve is tricuspid. Aortic valve regurgitation is not visualized. Aortic valve  sclerosis is present, with no evidence of aortic valve stenosis.  5. The inferior vena cava is normal in size with greater than 50% respiratory variability, suggesting right atrial pressure of 3 mmHg. FINDINGS  Left Ventricle: Left ventricular ejection fraction, by estimation, is 60 to 65%. The left ventricle has normal function. The left ventricle has no regional wall motion abnormalities. The left ventricular internal cavity size was small. There is mild left ventricular hypertrophy. Left ventricular diastolic parameters are consistent with Grade I diastolic dysfunction (impaired relaxation). Right Ventricle: The right ventricular size is normal. No increase in right ventricular wall thickness. Right ventricular systolic function is normal. Tricuspid regurgitation signal is inadequate for assessing PA pressure. Left Atrium: Left atrial size was normal in size. Right Atrium: Right atrial size was normal in size. Pericardium: There is no evidence of pericardial effusion. Mitral Valve: The mitral valve is normal in structure. No evidence of mitral valve regurgitation. MV peak gradient, 5.3 mmHg. The mean mitral valve gradient is 3.0 mmHg. Tricuspid Valve: The tricuspid valve is normal in structure. Tricuspid valve regurgitation is trivial. Aortic Valve: The aortic valve is tricuspid. Aortic valve regurgitation is not visualized. Aortic valve sclerosis is present, with no evidence of aortic valve stenosis. Pulmonic Valve: The pulmonic valve was not well visualized. Pulmonic valve regurgitation is not visualized. Aorta: The aortic root and ascending aorta are structurally normal, with no evidence of dilitation. Venous: The inferior vena cava is normal in size with greater than 50% respiratory variability, suggesting right atrial pressure of 3 mmHg.  IAS/Shunts: The interatrial septum was not well visualized.  LEFT VENTRICLE PLAX 2D LVIDd:         3.30 cm     Diastology LVIDs:         2.20 cm     LV e' medial:    4.03  cm/s LV PW:         1.20 cm     LV E/e' medial:  13.9 LV IVS:        1.45 cm     LV e' lateral:   7.07 cm/s LVOT diam:     2.20 cm     LV E/e' lateral: 7.9 LV SV:         72 LV SV Index:   34 LVOT Area:     3.80 cm  LV Volumes (MOD) LV vol d, MOD A2C: 66.7 ml LV vol d, MOD A4C: 69.5 ml LV vol s, MOD A2C: 28.5 ml LV vol s, MOD A4C: 24.4 ml LV SV MOD A2C:     38.2 ml LV SV MOD A4C:     69.5 ml LV SV MOD BP:      42.4 ml RIGHT VENTRICLE             IVC RV Basal diam:  2.60 cm     IVC diam: 1.00 cm RV S prime:     14.00 cm/s TAPSE (M-mode): 1.6 cm LEFT ATRIUM             Index        RIGHT ATRIUM           Index LA diam:        3.70 cm 1.72 cm/m   RA Area:     11.60 cm LA Vol (A2C):   33.6 ml 15.60 ml/m  RA Volume:   24.60 ml  11.42 ml/m LA Vol (A4C):   29.7 ml 13.79 ml/m LA Biplane Vol: 32.2 ml 14.95 ml/m  AORTIC VALVE LVOT Vmax:   99.40 cm/s LVOT Vmean:  74.500 cm/s LVOT VTI:    0.190 m  AORTA Ao Root diam: 3.40 cm Ao Asc diam:  3.70 cm MITRAL VALVE MV Area (PHT): 2.32 cm     SHUNTS MV Area VTI:   3.42 cm     Systemic VTI:  0.19 m MV Peak grad:  5.3 mmHg     Systemic Diam: 2.20 cm MV Mean grad:  3.0 mmHg MV Vmax:       1.15 m/s MV Vmean:      76.3 cm/s MV Decel Time: 327 msec MV E velocity: 56.20 cm/s MV A velocity: 103.00 cm/s MV E/A ratio:  0.55 Epifanio Lesches MD Electronically signed by Epifanio Lesches MD Signature Date/Time: 07/07/2023/7:55:02 PM    Final        Assessment/Plan: Diagnosis: 72 year old male status post right cerebellar infarct with ongoing right sided weakness and ataxia. Does the need for close, 24 hr/day medical supervision in concert with the patient's rehab needs make it unreasonable for this patient to be served in a less intensive setting? Yes Co-Morbidities requiring supervision/potential complications:  -? normal pressure hydrocephalus -Lung cancer status post chemotherapy and radiation -Chronic asthmatic bronchitis -CAD status post PCI/DES -Obesity Due to  bladder management, bowel management, safety, skin/wound care, disease management, medication administration, pain management, and patient education, does the patient require 24 hr/day rehab nursing? Yes Does the patient require coordinated care of a physician, rehab nurse, therapy disciplines of PT, OT to address physical and functional  deficits in the context of the above medical diagnosis(es)? Yes Addressing deficits in the following areas: balance, endurance, locomotion, strength, transferring, bowel/bladder control, bathing, dressing, feeding, grooming, toileting, and psychosocial support Can the patient actively participate in an intensive therapy program of at least 3 hrs of therapy per day at least 5 days per week? Yes The potential for patient to make measurable gains while on inpatient rehab is excellent Anticipated functional outcomes upon discharge from inpatient rehab are modified independent and supervision  with PT, modified independent and supervision with OT, n/a with SLP. Estimated rehab length of stay to reach the above functional goals is: 10-12 days Anticipated discharge destination: Home Overall Rehab/Functional Prognosis: excellent   POST ACUTE RECOMMENDATIONS: This patient's condition is appropriate for continued rehabilitative care in the following setting: CIR Patient has agreed to participate in recommended program. Yes Note that insurance prior authorization may be required for reimbursement for recommended care.   Comment: Pt motivated to return home with wife. LP once home to assess for NPH       I have personally performed a face to face diagnostic evaluation of this patient. Additionally, I have examined the patient's medical record including any pertinent labs and radiographic images. If the physician assistant has documented in this note, I have reviewed and edited or otherwise concur with the physician assistant's documentation.   Thanks,   Ranelle Oyster,  MD 07/08/2023

## 2023-07-14 NOTE — Plan of Care (Signed)
  Problem: Consults Goal: RH STROKE PATIENT EDUCATION Description: See Patient Education module for education specifics  Outcome: Progressing   Problem: RH BOWEL ELIMINATION Goal: RH STG MANAGE BOWEL WITH ASSISTANCE Description: STG Manage Bowel with toileting Assistance. Outcome: Progressing Goal: RH STG MANAGE BOWEL W/MEDICATION W/ASSISTANCE Description: STG Manage Bowel with Medication with mod I Assistance. Outcome: Progressing   Problem: RH SAFETY Goal: RH STG ADHERE TO SAFETY PRECAUTIONS W/ASSISTANCE/DEVICE Description: STG Adhere to Safety Precautions With cues Assistance/Device. Outcome: Progressing   Problem: RH COGNITION-NURSING Goal: RH STG USES MEMORY AIDS/STRATEGIES W/ASSIST TO PROBLEM SOLVE Description: STG Uses Memory Aids/Strategies With cues Assistance to Problem Solve. Outcome: Progressing   Problem: RH KNOWLEDGE DEFICIT Goal: RH STG INCREASE KNOWLEDGE OF HYPERTENSION Description: Patient and spouse will be able to manage HTN with medications and dietary modification using educational resources independently Outcome: Progressing Goal: RH STG INCREASE KNOWLEGDE OF HYPERLIPIDEMIA Description: Patient and spouse will be able to manage HLD with medications and dietary modification using educational resources independently Outcome: Progressing Goal: RH STG INCREASE KNOWLEDGE OF STROKE PROPHYLAXIS Description: Patient and spouse will be able to manage secondary risks with medications and dietary modification using educational resources independently Outcome: Progressing

## 2023-07-14 NOTE — Evaluation (Signed)
Physical Therapy Assessment and Plan  Patient Details  Name: Jeremy Lopez MRN: [id number] Date of Birth: 31-[name]-1951  PT Diagnosis: Abnormality of gait, Difficulty walking, Hemiplegia dominant, and Muscle weakness Rehab Potential: Good ELOS: 7-10 days   Today's Date: 07/14/2023 PT Individual Time: 1046-1200 and 1346-1445 PT Individual Time Calculation (min): 74 min  and 59 min.  Hospital Problem: Principal Problem:   Acute CVA (cerebrovascular accident) [facility])   Past Medical History:  Past Medical History:  Diagnosis Date   CAD (coronary artery disease), native coronary artery    DES proximal LAD 07/2009; DES to RCA 04/2020   Chronic heart failure with preserved ejection fraction (HFpEF) (HCC)    Essential hypertension    Habitual alcohol use    Hyperlipidemia    PVC's (premature ventricular contractions)    Small cell lung cancer (HCC)    Right upper lobe 2008; no evidence of disease since treatment ended in 11/2007   Past Surgical History:  Past Surgical History:  Procedure Laterality Date   CORONARY ATHERECTOMY N/A 04/26/2020   Procedure: CORONARY ATHERECTOMY;  Surgeon: Corky Crafts, MD;  Location: [facility] INVASIVE CV LAB;  Service: Cardiovascular;  Laterality: N/A;   CORONARY STENT INTERVENTION N/A 04/26/2020   Procedure: CORONARY STENT INTERVENTION;  Surgeon: Corky Crafts, MD;  Location: [facility] INVASIVE CV LAB;  Service: Cardiovascular;  Laterality: N/A;   CORONARY ULTRASOUND/IVUS N/A 04/26/2020   Procedure: Intravascular Ultrasound/IVUS;  Surgeon: Corky Crafts, MD;  Location: [facility] INVASIVE CV LAB;  Service: Cardiovascular;  Laterality: N/A;   Insertion of left subclavian Port-A-Cath  2008   LEFT HEART CATH AND CORONARY ANGIOGRAPHY N/A 07/03/2022   Procedure: LEFT HEART CATH AND CORONARY ANGIOGRAPHY;  Surgeon: [place], Peter M, MD;  Location: [facility] INVASIVE CV LAB;  Service: Cardiovascular;  Laterality: N/A;   RIGHT/LEFT HEART CATH AND CORONARY ANGIOGRAPHY N/A  04/26/2020   Procedure: RIGHT/LEFT HEART CATH AND CORONARY ANGIOGRAPHY;  Surgeon: Corky Crafts, MD;  Location: [facility] INVASIVE CV LAB;  Service: Cardiovascular;  Laterality: N/A;   TEMPORARY PACEMAKER N/A 04/26/2020   Procedure: TEMPORARY PACEMAKER;  Surgeon: Corky Crafts, MD;  Location: [facility] INVASIVE CV LAB;  Service: Cardiovascular;  Laterality: N/A;   Video bronchoscopy and video mediastinoscopy  2008    Assessment & Plan Clinical Impression: Amy Usher is a 72 year old male who presented to ED with disequilibrium, right sided weakness with paresthesias on 07/05/2023. Last known normal 2 AM on 07/06/2023. CT head with noted chronic ventriculomegaly. CT angiogram head/neck with no large vessel occlusion. MRI brain without contrast with acute versus early subacute infarct medial inferior right cerebellum. LDL 34, hemoglobin A1c 5.7. TTE with LVEF 60 to 65%, mild LVH, grade 1 diastolic dysfunction, IVC normal in size. UDS negative. EtOH level less than 10. Seen by neurology with recommendation of aspirin and Plavix x 3 weeks followed by aspirin alone.  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 Marjory Lies. He will have LP rescheduled after 3 weeks of DAPT.  PMH significant for lung cancer (2008), CAD s/p DES 2010, 2021, hypertension, chronic HFpEF. Patient with notable improvement in seated balance, only slight right lateral lean and able to self correct once he starts to drift. Standing NMRE requires BIL UE support and intermittent Mod assist for LOB towards right. Gait up to 65 feet on 10/03, emphasizing dynamic midline awareness, gait symmetry, step length, and RW placement. Requires up to mod assist for LOB  towards right. Good awareness of deficit but challenging for patient to correct. The patient requires inpatient medicine and rehabilitation evaluations and services for ongoing dysfunction secondary to right cerebellar  infarct.  Patient currently requires min with mobility secondary to muscle weakness, decreased coordination and decreased motor planning, and decreased standing balance, decreased postural control, and decreased balance strategies.  Prior to hospitalization, patient was independent  with mobility and lived with Spouse in a House home.  Home access is 2Stairs to enter.  Patient will benefit from skilled PT intervention to maximize safe functional mobility, minimize fall risk, and decrease caregiver burden for planned discharge home with 24 hour supervision.  Anticipate patient will benefit from follow up OP at discharge.  PT - End of Session Activity Tolerance: Tolerates 30+ min activity with multiple rests Endurance Deficit: Yes PT Assessment Rehab Potential (ACUTE/IP ONLY): Good PT Barriers to Discharge: Home environment access/layout PT Patient demonstrates impairments in the following area(s): Balance;Safety;Endurance;Motor PT Transfers Functional Problem(s): Bed Mobility;Bed to Chair;Car;Furniture PT Locomotion Functional Problem(s): Ambulation;Wheelchair Mobility;Stairs PT Plan PT Intensity: Minimum of 1-2 x/day ,45 to 90 minutes PT Frequency: 5 out of 7 days PT Duration Estimated Length of Stay: 7-10 days PT Treatment/Interventions: Ambulation/gait training;Community reintegration;Neuromuscular re-education;Stair training;UE/LE Strength taining/ROM;Wheelchair propulsion/positioning;Therapeutic Activities;UE/LE Coordination activities;Discharge planning;Balance/vestibular training;Functional mobility training;Patient/family education;Therapeutic Exercise PT Transfers Anticipated Outcome(s): Mod I PT Locomotion Anticipated Outcome(s): Mod I w/ LRAD PT Recommendation Follow Up Recommendations: Outpatient PT Patient destination: Home Equipment Recommended: To be determined Equipment Details: Pt states equipment at home from neighbor, RW, shower chair, grab bars.   PT  Evaluation Precautions/Restrictions Precautions Precautions: Fall Precaution Comments: R hemi, list right. Restrictions Weight Bearing Restrictions: No General Chart Reviewed: Yes Family/Caregiver Present: Yes Vital SignsTherapy Vitals Temp: 98 F (36.7 C) Pulse Rate: 70 Resp: 16 BP: (!) 126/58 Patient Position (if appropriate): Sitting Oxygen Therapy SpO2: 96 % O2 Device: Room Air Pain Pain Assessment Pain Scale: 0-10 Pain Score: 0-No pain Pain Interference Pain Interference Pain Effect on Sleep: 1. Rarely or not at all Pain Interference with Therapy Activities: 1. Rarely or not at all Pain Interference with Day-to-Day Activities: 1. Rarely or not at all Home Living/Prior Functioning Home Living Available Help at Discharge: Family Type of Home: House Home Access: Stairs to enter Secretary/administrator of Steps: 2 Entrance Stairs-Rails: None Home Layout: One level  Lives With: Spouse Prior Function Level of Independence: Independent with transfers;Requires assistive device for independence  Able to Take Stairs?: Reciprically Driving: Yes Vision/Perception  Vision - History Ability to See in Adequate Light: 0 Adequate  Cognition Overall Cognitive Status: Within Functional Limits for tasks assessed Arousal/Alertness: Awake/alert Awareness: Appears intact Safety/Judgment: Appears intact (aware of list/lean to right.) Sensation Sensation Light Touch: Appears Intact Coordination Gross Motor Movements are Fluid and Coordinated: No Fine Motor Movements are Fluid and Coordinated: No Heel Shin Test: slightly decreased on Right. Motor  Motor Motor: Hemiplegia Motor - Skilled Clinical Observations: slight R hemi, strength remains WFL.   Trunk/Postural Assessment  Cervical Assessment Cervical Assessment: Within Functional Limits Thoracic Assessment Thoracic Assessment: Within Functional Limits Lumbar Assessment Lumbar Assessment: Exceptions to [facility] (posterior  pelvic tilt.) Postural Control Postural Control: Deficits on evaluation Protective Responses: delayed, aware of list/lean to right but requires A to regain.  Balance Balance Balance Assessed: Yes Static Sitting Balance Static Sitting - Balance Support: Feet supported Static Sitting - Level of Assistance: 5: Stand by assistance Dynamic Sitting Balance Dynamic Sitting - Balance Support: Feet supported Dynamic Sitting - Level of Assistance:  5: Stand by assistance Static Standing Balance Static Standing - Balance Support: Bilateral upper extremity supported Static Standing - Level of Assistance: 4: Min assist Dynamic Standing Balance Dynamic Standing - Balance Support: Bilateral upper extremity supported Dynamic Standing - Level of Assistance: 4: Min assist Extremity Assessment      RLE Assessment RLE Assessment: Within Functional Limits LLE Assessment LLE Assessment: Within Functional Limits  Care Tool Care Tool Bed Mobility Roll left and right activity   Roll left and right assist level: Supervision/Verbal cueing    Sit to lying activity   Sit to lying assist level: Supervision/Verbal cueing    Lying to sitting on side of bed activity   Lying to sitting on side of bed assist level: the ability to move from lying on the back to sitting on the side of the bed with no back support.: Supervision/Verbal cueing     Care Tool Transfers Sit to stand transfer   Sit to stand assist level: Contact Guard/Touching assist    Chair/bed transfer   Chair/bed transfer assist level: Contact Guard/Touching assist     Toilet transfer        Car transfer   Car transfer assist level: Contact Guard/Touching assist      Care Tool Locomotion Ambulation   Assist level: Moderate Assistance - Patient 50 - 74% Assistive device: No Device Max distance: 90  Walk 10 feet activity   Assist level: Moderate Assistance - Patient - 50 - 74% Assistive device: No Device   Walk 50 feet with 2  turns activity   Assist level: Moderate Assistance - Patient - 50 - 74% Assistive device: No Device  Walk 150 feet activity Walk 150 feet activity did not occur: Safety/medical concerns      Walk 10 feet on uneven surfaces activity   Assist level: Moderate Assistance - Patient - 50 - 74% Assistive device: Other (comment) (no device.)  Stairs   Assist level: Minimal Assistance - Patient > 75% Stairs assistive device: 2 hand rails;1 hand rail Max number of stairs: 12  Walk up/down 1 step activity   Walk up/down 1 step (curb) assist level: Minimal Assistance - Patient > 75% Walk up/down 1 step or curb assistive device: 1 hand rail;2 hand rails  Walk up/down 4 steps activity   Walk up/down 4 steps assist level: Minimal Assistance - Patient > 75% Walk up/down 4 steps assistive device: 1 hand rail;2 hand rails  Walk up/down 12 steps activity   Walk up/down 12 steps assist level: Minimal Assistance - Patient > 75% Walk up/down 12 steps assistive device: 1 hand rail;2 hand rails  Pick up small objects from floor   Pick up small object from the floor assist level: Minimal Assistance - Patient > 75% Pick up small object from the floor assistive device: no device  Wheelchair Is the patient using a wheelchair?: Yes Type of Wheelchair: Manual   Wheelchair assist level: Supervision/Verbal cueing Max wheelchair distance: 50  Wheel 50 feet with 2 turns activity   Assist Level: Supervision/Verbal cueing  Wheel 150 feet activity   Assist Level: Maximal Assistance - Patient 25 - 49%    Refer to Care Plan for Long Term Goals  SHORT TERM GOAL WEEK 1 PT Short Term Goal 1 (Week 1): STG=LTG 2/2 ELOS.  Recommendations for other services: None   Skilled Therapeutic Intervention Evaluation completed (see details above and below) with education on PT POC and goals and individual treatment initiated with focus on  balance, NMR R,  endurance, strengthening, transfers, gait. First session:  Pt presents  sitting in recliner and agreeable to therapy.  Pt transfers sit to stand w/ min/CGA and cues for scooting forward and forward lean to allow for increased WB through toes, tends to lean back.  Pt amb w/ RW and min A x 75' w/ lean to R, requires verbal and manual cues for weight shift to Left LE.  Pt performed car transfer w/ CGA and cueing.  Pt negotiated 12 steps w/ 1 and 2 rails, self-selecting reciprocal gait pattern ascending and step-to when descending.  Pt returned to room and remained sitting in recliner w/ chair alarm on and all needs in reach.  Second session:  Pt presents sitting in recliner and agreeable to therapy, going outside for change in scenery and for gait on uneven surfaces.  Pt transferred sit to stand w/ CGA and amb x 3' to w/c w/o AD and min/light mod A 2/2 lean to right.  Pt wheeled to outside for energy conservation.  Pt amb multiple trials w/o AD and min to mod A up to 90'.  Pt requires verbal and manual cues for weight shift to L w/ occasional shuffles RLE.  Pt is aware of lean and encouraged to use visual cues to maintain/make aware of list.  Pt performed standing marching w/ mod A and cues for weight shift.  Pt amb w/ RW x 20' into room to bed w/ min A and improved maintenance of midline gait.  Bed alarm on and all needs in reach.   Mobility Bed Mobility Bed Mobility: Rolling Right;Rolling Left;Supine to Sit;Sit to Supine Rolling Right: Supervision/verbal cueing Rolling Left: Supervision/Verbal cueing Supine to Sit: Supervision/Verbal cueing Sit to Supine: Supervision/Verbal cueing Transfers Transfers: Stand to Sit;Sit to Stand;Stand Pivot Transfers Sit to Stand: Contact Guard/Touching assist;Minimal Assistance - Patient > 75% Stand to Sit: Contact Guard/Touching assist;Minimal Assistance - Patient > 75% Stand Pivot Transfers: Minimal Assistance - Patient > 75%;Contact Guard/Touching assist Stand Pivot Transfer Details: Verbal cues for sequencing;Verbal cues for  precautions/safety Transfer (Assistive device): None Locomotion  Gait Ambulation: Yes Gait Assistance: Minimal Assistance - Patient > 75% Gait Distance (Feet): 90 Feet Assistive device: Rolling walker;Other (Comment) (and w/o AD) Gait Assistance Details: Verbal cues for gait pattern;Manual facilitation for weight shifting;Verbal cues for safe use of DME/AE Gait Gait: Yes Gait Pattern: Decreased step length - left;Lateral trunk lean to right;Narrow base of support;Decreased weight shift to left Gait velocity: decr Stairs / Additional Locomotion Stairs: Yes Stairs Assistance: Minimal Assistance - Patient > 75% Stair Management Technique: Two rails;One rail Left Number of Stairs: 12 Height of Stairs: 6 Ramp: Minimal Assistance - Patient >75% Curb: Minimal Assistance - Patient >75% Wheelchair Mobility Wheelchair Mobility: Yes Wheelchair Assistance: Doctor, general practice: Both upper extremities Wheelchair Parts Management: Needs assistance Distance: 50   Discharge Criteria: Patient will be discharged from PT if patient refuses treatment 3 consecutive times without medical reason, if treatment goals not met, if there is a change in medical status, if patient makes no progress towards goals or if patient is discharged from hospital.  The above assessment, treatment plan, treatment alternatives and goals were discussed and mutually agreed upon: by patient and by family  Lucio Edward 07/14/2023, 3:53 PM

## 2023-07-14 NOTE — Progress Notes (Signed)
PROGRESS NOTE   Subjective/Complaints: Slept fairly well. Cough seems a little better this am.   ROS: Patient denies fever, rash, sore throat, blurred vision, dizziness, nausea, vomiting, diarrhea,   shortness of breath or chest pain, joint or back/neck pain, headache, or mood change.    Objective:   No results found. Recent Labs    07/12/23 0702  WBC 8.2  HGB 13.1  HCT 40.7  PLT 351   Recent Labs    07/12/23 0702  NA 135  K 4.0  CL 100  CO2 26  GLUCOSE 94  BUN 21  CREATININE 1.14  CALCIUM 8.6*    Intake/Output Summary (Last 24 hours) at 07/14/2023 0742 Last data filed at 07/13/2023 2140 Gross per 24 hour  Intake 180 ml  Output 575 ml  Net -395 ml        Physical Exam: Vital Signs Blood pressure 122/66, pulse 88, temperature 99.5 F (37.5 C), temperature source Oral, resp. rate 16, height 5\' 10"  (1.778 m), weight 92.6 kg, SpO2 (!) 89%.  General: Alert and oriented x 3, No apparent distress HEENT: Head is normocephalic, atraumatic, PERRLA, EOMI, sclera anicteric, oral mucosa pink and moist, dentition intact, ext ear canals clear,  Neck: Supple without JVD or lymphadenopathy Heart: Reg rate and rhythm. No murmurs rubs or gallops Chest: CTA bilaterally without wheezes, rales, or rhonchi; no distress Abdomen: Soft, non-tender, non-distended, bowel sounds positive. Extremities: No clubbing, cyanosis, or edema. Pulses are 2+ Psych: Pt's affect is appropriate. Pt is cooperative Skin: Clean and intact without signs of breakdown Neuro:  Alert and oriented x 3. Normal insight and awareness. Intact Memory. Normal language and speech. Cranial nerve exam unremarkable. MMT: 5/5 LUE and LLE. 4+/5 RUE and RLE. Mild right limb ataxia. Sensory exam normal for light touch and pain in all 4 limbs.  No abnormal tone appreciated.  .   Musculoskeletal: Full ROM, No pain with AROM or PROM in the neck, trunk, or extremities.  Posture appropriate     Assessment/Plan: 1. Functional deficits which require 3+ hours per day of interdisciplinary therapy in a comprehensive inpatient rehab setting. Physiatrist is providing close team supervision and 24 hour management of active medical problems listed below. Physiatrist and rehab team continue to assess barriers to discharge/monitor patient progress toward functional and medical goals  Care Tool:  Bathing              Bathing assist       Upper Body Dressing/Undressing Upper body dressing        Upper body assist      Lower Body Dressing/Undressing Lower body dressing            Lower body assist       Toileting Toileting    Toileting assist       Transfers Chair/bed transfer  Transfers assist           Locomotion Ambulation   Ambulation assist              Walk 10 feet activity   Assist           Walk 50 feet activity   Assist  Walk 150 feet activity   Assist           Walk 10 feet on uneven surface  activity   Assist           Wheelchair     Assist               Wheelchair 50 feet with 2 turns activity    Assist            Wheelchair 150 feet activity     Assist          Blood pressure 122/66, pulse 88, temperature 99.5 F (37.5 C), temperature source Oral, resp. rate 16, height 5\' 10"  (1.778 m), weight 92.6 kg, SpO2 (!) 89%.  Medical Problem List and Plan: 1. Functional deficits secondary to right medial inferior cerebellar infarct.             -hx of gait disorder, ?NPH with shuffling, forward leaning gait but was distinctly different than what he's experienced after this stroke.             -patient may shower             -ELOS/Goals: 10-12 days, mod I goals with PT and OT   -Patient is beginning CIR therapies today including PT and OT  2.  Antithrombotics: -DVT/anticoagulation:  Pharmaceutical: Lovenox             -antiplatelet therapy:  Aspirin and Plavix for 3 weeks then plavix alone given pt's hx of CAD (cards/neuro on board)   3. Pain Management: Tylenol as needed             -continue gabapentin 300 mg q HS   4. Mood/Behavior/Sleep: LCSW to evaluate and provide emotional support             -continue escitalopram 10 mg daily             -continue home trazodone 50 mg q HS--sleeping well             -antipsychotic agents: n/a              5. Neuropsych/cognition: This patient is capable of making decisions on his own behalf.   6. Skin/Wound Care: Routine skin care checks   7. Fluids/Electrolytes/Nutrition: Routine Is and Os and follow-up chemistries   8: Hypertension: monitor TID and prn             -continue metoprolol succinate 100 mg daily             -continue diltiazem 240 mg daily   9: Hyperlipidemia: continue statin   10: History of CAD; on aspirin and statin  -continue plavix as well             -follow-up with Dr. Dietrich Pates   11: History lung cancer 2008   12: Asthmatic bronchitis/former smoker, slight exacerbation (at home uses Trelegy/Ventolin)             -continue Breo/Incruse inhalers             -continue mucinex dm which seems to have helped             -prn duoneb, hycodan             -follow-up with Dr. Tonia Brooms   12: Gait disturbance: recent work-up as outpatient by Dr. Marjory Lies             -to re-schedule LP for findings of ventriculomegaly/NPH   -plavix now long  term, so this will need to be considered in scheduling procedure.  14: Chronic HFpEF: check daily weights    Filed Weights   07/13/23 1600 07/14/23 0553  Weight: 95.7 kg 92.6 kg     LOS: 1 days A FACE TO FACE EVALUATION WAS PERFORMED  Ranelle Oyster 07/14/2023, 7:42 AM

## 2023-07-14 NOTE — Progress Notes (Signed)
PMR Admission Coordinator Pre-Admission Assessment   Patient: Jeremy Lopez is an 72 y.o., male MRN: [id number] DOB: 04-21-1951 Height: 5\' 10"  (177.8 cm) Weight: 97.6 kg                                                                                                                                                  Insurance Information HMO: yes    PPO:      PCP:      IPA:      80/20:      OTHER:  PRIMARY: [employer]      Policy#: [id number]      Subscriber: patient CM Name: Tammy      Phone#: [phone]     Fax#: [phone] Pre-Cert#: [id number] approved for 7 days on 07/11/23      Employer:  Benefits:  Phone: [phone]-option 1, spoke with Nat Christen Date: 10/08/2022 - 10/08/2023 Deductible: no deductible ($0) OOP Max: $2,900 ($185 met) CIR: $200/day co-pay for days 1-5, $0/day days 6-90 SNF:  $0/day co-pay for days 1-20, $203/day co-pay for days 21-100; limited to 100 days/benefit period Outpatient: $0/visit co-pay Home Health:  100% coverage DME: 80% coverage; 20% co-insurance Providers: in [employer]   SECONDARY:       Policy#:       Phone#:    Artist:       Phone#:    The Data processing manager" for patients in Inpatient Rehabilitation Facilities with attached "Privacy Act Statement-Health Care Records" was provided and verbally reviewed with: Patient and Family   Emergency Contact Information Contact Information       Name Relation Home Work Mobile    [place] Spouse [phone]   [phone]    Alyssa, [place]     [phone]         Other Contacts       Name Relation Home Work Mobile    [place] Daughter     [phone]         Current Medical History  Patient Admitting Diagnosis: R cerebellar CVA   History of Present Illness: Jeremy Lopez is a 72 y.o. male with a history of hypertension, CAD, depression, lung cancer status post chemotherapy and radiation who presented to [facility] on 9/27 with  right-sided weakness and loss of balance as well as headaches.  Initial CT did not show acute changes but MRI of the brain demonstrated acute/subacute medial inferior right cerebellum infarct.  Patient was transferred to [facility] for further work up on 9/27. Patient was placed on aspirin and Plavix.  Ventriculomegaly on imaging is chronic and was being followed by neurology as an outpatient with the question of normal pressure hydrocephalus.  Plan is for LP after 3 weeks of DAPT.  Patient was up with therapy over the weekend and was min assist for sit to  stand transfers and min assist for 65 feet with ataxic, scissoring, staggering gait often drifting to the right.  Patient lives in a 1 level home with his wife with level entry.  The patient was modified independent and driving prior to his admission.    Complete NIHSS TOTAL: 1 Glasgow Coma Scale Score: 15   Patient's medical record from [facility] and [facility] has been reviewed by the rehabilitation admission coordinator and physician.   Past Medical History      Past Medical History:  Diagnosis Date   CAD (coronary artery disease), native coronary artery      DES proximal LAD 07/2009; DES to RCA 04/2020   Chronic heart failure with preserved ejection fraction (HFpEF) (HCC)     Essential hypertension     Habitual alcohol use     Hyperlipidemia     PVC's (premature ventricular contractions)     Small cell lung cancer (HCC)      Right upper lobe 2008; no evidence of disease since treatment ended in 11/2007          Has the patient had major surgery during 100 days prior to admission? No   Family History  family history includes Alcohol abuse in his maternal uncle; CAD in his brother; Cancer in his father; Depression in his maternal uncle; Hypertension in his brother.     Current Medications   Current Medications    Current Facility-Administered Medications:    acetaminophen (TYLENOL) tablet 650 mg, 650 mg, Oral, Q6H PRN,  Zierle-Ghosh, Asia B, DO, 650 mg at 07/07/23 0940   aspirin EC tablet 81 mg, 81 mg, Oral, Daily, Zierle-Ghosh, Asia B, DO, 81 mg at 07/12/23 1610   bisacodyl (DULCOLAX) suppository 10 mg, 10 mg, Rectal, Daily PRN, Zigmund Daniel., MD, 10 mg at 07/10/23 0800   clopidogrel (PLAVIX) tablet 75 mg, 75 mg, Oral, Daily, Zierle-Ghosh, Asia B, DO, 75 mg at 07/12/23 9604   diltiazem (CARDIZEM CD) 24 hr capsule 240 mg, 240 mg, Oral, Daily, [place], Alvira Philips, DO, 240 mg at 07/12/23 5409   enoxaparin (LOVENOX) injection 40 mg, 40 mg, Subcutaneous, Q24H, Zierle-Ghosh, Asia B, DO, 40 mg at 07/12/23 0824   escitalopram (LEXAPRO) tablet 10 mg, 10 mg, Oral, Daily, Zierle-Ghosh, Asia B, DO, 10 mg at 07/12/23 0822   fluticasone furoate-vilanterol (BREO ELLIPTA) 100-25 MCG/ACT 1 puff, 1 puff, Inhalation, Daily, 1 puff at 07/12/23 0801 **AND** umeclidinium bromide (INCRUSE ELLIPTA) 62.5 MCG/ACT 1 puff, 1 puff, Inhalation, Daily, Zierle-Ghosh, Asia B, DO, 1 puff at 07/12/23 0801   gabapentin (NEURONTIN) capsule 300 mg, 300 mg, Oral, QHS, Zierle-Ghosh, Asia B, DO, 300 mg at 07/11/23 2023   HYDROcodone bit-homatropine (HYCODAN) 5-1.5 MG/5ML syrup 5 mL, 5 mL, Oral, Q4H PRN, [place], Alvira Philips, DO, 5 mL at 07/12/23 8119   loratadine (CLARITIN) tablet 10 mg, 10 mg, Oral, Daily, [place], Alvira Philips, DO, 10 mg at 07/12/23 1478   metoprolol succinate (TOPROL-XL) 24 hr tablet 100 mg, 100 mg, Oral, Daily, [place], Alvira Philips, DO, 100 mg at 07/12/23 2956   Oral care mouth rinse, 15 mL, Mouth Rinse, PRN, Zierle-Ghosh, Asia B, DO   rosuvastatin (CRESTOR) tablet 40 mg, 40 mg, Oral, Daily, Zierle-Ghosh, Asia B, DO, 40 mg at 07/12/23 2130   senna-docusate (Senokot-S) tablet 1 tablet, 1 tablet, Oral, QHS PRN, Zierle-Ghosh, Asia B, DO, 1 tablet at 07/09/23 1023   traZODone (DESYREL) tablet 50 mg, 50 mg, Oral, QHS, Zierle-Ghosh, Asia B, DO, 50 mg at 07/11/23 2023  Patients Current Diet:  Diet Order                  Diet Heart Room service  appropriate? Yes; Fluid consistency: Thin  Diet effective now                         Precautions / Restrictions Precautions Precautions: Fall Restrictions Weight Bearing Restrictions: No    Has the patient had 2 or more falls or a fall with injury in the past year?No   Prior Activity Level   Prior Functional Level Prior Function Prior Level of Function : Independent/Modified Independent, Driving   Self Care: Did the patient need help bathing, dressing, using the toilet or eating?  Independent   Indoor Mobility: Did the patient need assistance with walking from room to room (with or without device)? Independent   Stairs: Did the patient need assistance with internal or external stairs (with or without device)? Independent   Functional Cognition: Did the patient need help planning regular tasks such as shopping or remembering to take medications? Independent   Patient Information Are you of Hispanic, Latino/a,or Spanish origin?: A. No, not of Hispanic, Latino/a, or Spanish origin What is your race?: A. White Do you need or want an interpreter to communicate with a doctor or health care staff?: 0. No   Patient's Response To:  Health Literacy and Transportation Is the patient able to respond to health literacy and transportation needs?: Yes Health Literacy - How often do you need to have someone help you when you read instructions, pamphlets, or other written material from your doctor or pharmacy?: Never In the past 12 months, has lack of transportation kept you from medical appointments or from getting medications?: No In the past 12 months, has lack of transportation kept you from meetings, work, or from getting things needed for daily living?: No   Home Assistive Devices / Equipment Home Equipment: Shower seat   Prior Device Use: Indicate devices/aids used by the patient prior to current illness, exacerbation or injury? None of the above   Current Functional  Level Cognition   Arousal/Alertness: Awake/alert Overall Cognitive Status: Impaired/Different from baseline Orientation Level: Oriented X4 General Comments: following one step commands consistantly Attention: Sustained Sustained Attention: Appears intact Memory: Impaired (2/5 words) Memory Impairment: Retrieval deficit Awareness: Appears intact Problem Solving: Appears intact Safety/Judgment: Appears intact    Extremity Assessment (includes Sensation/Coordination)   Upper Extremity Assessment: Generalized weakness RUE Deficits / Details: generally weaker than L, also pt's non-dom hand so could be baseline for him. Coordination and dexterity were WFL  Lower Extremity Assessment: Defer to PT evaluation     ADLs   Overall ADL's : Needs assistance/impaired Eating/Feeding: Modified independent, Bed level Grooming: Wash/dry hands, Wash/dry face, Oral care, Applying deodorant, Brushing hair, Contact guard assist, Standing Grooming Details (indicate cue type and reason): right lateral leaning while standing at sink with cues to correct Upper Body Bathing: Contact guard assist, Standing Lower Body Bathing: Moderate assistance, Sit to/from stand Upper Body Dressing : Set up, Sitting Lower Body Dressing: Sit to/from stand, Maximal assistance Toilet Transfer: Minimal assistance, Moderate assistance, Rolling walker (2 wheels), Ambulation Toilet Transfer Details (indicate cue type and reason): assistance due to right lateral leaning Functional mobility during ADLs: Moderate assistance, Rolling walker (2 wheels) General ADL Comments: session focused on cognition     Mobility   Overal bed mobility: Needs Assistance Bed Mobility: Supine to Sit Supine to sit: Supervision Sit  to supine: Supervision General bed mobility comments: min right lateral lean initially but corrected with cues     Transfers   Overall transfer level: Needs assistance Equipment used: Rolling walker (2  wheels) Transfers: Sit to/from Stand Sit to Stand: Contact guard assist General transfer comment: min assist with cues for hand placement for transfers     Ambulation / Gait / Stairs / Wheelchair Mobility   Ambulation/Gait Ambulation/Gait assistance: Mod assist Gait Distance (Feet): 65 Feet Assistive device: Rolling walker (2 wheels) Gait Pattern/deviations: Step-through pattern, Decreased step length - right, Decreased stance time - right, Decreased stride length, Decreased dorsiflexion - right, Decreased weight shift to left, Ataxic, Scissoring, Staggering right, Drifts right/left, Narrow base of support, Knees buckling General Gait Details: Intermittent mod assist for balance due to LOB towards right. Frequent tactile, verbal and visual cues to orient to midline and adjust Rt lean. Cues for gait symmetry with larger Rt step length however ataxic. Cues for RW placement closer to BOS especially with turns, where LOB is most pronounced. Gait velocity: decr Gait velocity interpretation: <1.31 ft/sec, indicative of household ambulator Pre-gait activities: march in place BIL UE support min assist     Posture / Balance Dynamic Sitting Balance Sitting balance - Comments: Minimal Rt lean. Balance Overall balance assessment: Needs assistance Sitting-balance support: No upper extremity supported, Feet supported Sitting balance-Leahy Scale: Fair Sitting balance - Comments: Minimal Rt lean. Postural control: Right lateral lean Standing balance support: Single extremity supported, Bilateral upper extremity supported, During functional activity Standing balance-Leahy Scale: Poor Standing balance comment: stood at sink for self care task with right lateral leaning and verbal and tactile cues to correct.     Special needs/care consideration Skin intact        Previous Home Environment (from acute therapy documentation) Living Arrangements: Spouse/significant other  Lives With: Spouse Available  Help at Discharge: Family Type of Home: House Home Layout: One level Home Access: Level entry Bathroom Shower/Tub: Health visitor: Handicapped height Bathroom Accessibility: Yes How Accessible: Accessible via walker Home Care Services: No   Discharge Living Setting Plans for Discharge Living Setting: Patient's home, Lives with (comment) (spouse) Type of Home at Discharge: House Discharge Home Layout: One level Discharge Home Access: Level entry Discharge Bathroom Shower/Tub: Walk-in shower Discharge Bathroom Toilet: Handicapped height Discharge Bathroom Accessibility: Yes How Accessible: Accessible via walker Does the patient have any problems obtaining your medications?: No   Social/Family/Support Systems Anticipated Caregiver: Spouse, [name] Anticipated Caregiver's Contact Information: [phone] Caregiver Availability: 24/7 Discharge Plan Discussed with Primary Caregiver: Yes Is Caregiver In Agreement with Plan?: Yes Does Caregiver/Family have Issues with Lodging/Transportation while Pt is in Rehab?: No     Goals Patient/Family Goal for Rehab: mod I PT, OT Expected length of stay: 10-12 days Pt/Family Agrees to Admission and willing to participate: Yes Program Orientation Provided & Reviewed with Pt/Caregiver Including Roles  & Responsibilities: Yes  Barriers to Discharge: Insurance for SNF coverage     Decrease burden of Care through IP rehab admission: Othern/a     Possible need for SNF placement upon discharge:not anticipated     Patient Condition: This patient's medical and functional status has changed since the consult dated 07/08/2023 in which the Rehabilitation Physician determined and documented that the patient was potentially appropriate for intensive rehabilitative care in an inpatient rehabilitation facility. Issues have been addressed and update has been discussed with Dr. Riley Kill and patient now appropriate for inpatient rehabilitation. Will  admit to inpatient rehab tomorrow, Saturday,  07/13/23.    Preadmission Screen Completed By:  Trish Mage, RN, 07/12/2023 11:36 AM ______________________________________________________________________   Discussed status with Dr. Riley Kill on10/04/24 at 1000 am  and received approval for admission tomorrow, Saturday, 07/13/23.   Admission Coordinator:  Trish Mage, time1137/Date10/4/24

## 2023-07-14 NOTE — Evaluation (Signed)
Occupational Therapy Assessment and Plan  Patient Details  Name: Jeremy Lopez MRN: [id number] Date of Birth: 02-Aug-1951  OT Diagnosis: hemiplegia affecting non-dominant side and muscle weakness (generalized) Rehab Potential: Rehab Potential (ACUTE ONLY): Good ELOS: 7-10 days   Today's Date: 07/14/2023 OT Individual Time: 4098-1191 OT Individual Time Calculation (min): 75 min     Hospital Problem: Principal Problem:   Acute CVA (cerebrovascular accident) [facility])   Past Medical History:  Past Medical History:  Diagnosis Date   CAD (coronary artery disease), native coronary artery    DES proximal LAD 07/2009; DES to RCA 04/2020   Chronic heart failure with preserved ejection fraction (HFpEF) (HCC)    Essential hypertension    Habitual alcohol use    Hyperlipidemia    PVC's (premature ventricular contractions)    Small cell lung cancer (HCC)    Right upper lobe 2008; no evidence of disease since treatment ended in 11/2007   Past Surgical History:  Past Surgical History:  Procedure Laterality Date   CORONARY ATHERECTOMY N/A 04/26/2020   Procedure: CORONARY ATHERECTOMY;  Surgeon: Corky Crafts, MD;  Location: [facility] INVASIVE CV LAB;  Service: Cardiovascular;  Laterality: N/A;   CORONARY STENT INTERVENTION N/A 04/26/2020   Procedure: CORONARY STENT INTERVENTION;  Surgeon: Corky Crafts, MD;  Location: [facility] INVASIVE CV LAB;  Service: Cardiovascular;  Laterality: N/A;   CORONARY ULTRASOUND/IVUS N/A 04/26/2020   Procedure: Intravascular Ultrasound/IVUS;  Surgeon: Corky Crafts, MD;  Location: [facility] INVASIVE CV LAB;  Service: Cardiovascular;  Laterality: N/A;   Insertion of left subclavian Port-A-Cath  2008   LEFT HEART CATH AND CORONARY ANGIOGRAPHY N/A 07/03/2022   Procedure: LEFT HEART CATH AND CORONARY ANGIOGRAPHY;  Surgeon: [place], Peter M, MD;  Location: [facility] INVASIVE CV LAB;  Service: Cardiovascular;  Laterality: N/A;   RIGHT/LEFT HEART CATH AND CORONARY ANGIOGRAPHY N/A  04/26/2020   Procedure: RIGHT/LEFT HEART CATH AND CORONARY ANGIOGRAPHY;  Surgeon: Corky Crafts, MD;  Location: [facility] INVASIVE CV LAB;  Service: Cardiovascular;  Laterality: N/A;   TEMPORARY PACEMAKER N/A 04/26/2020   Procedure: TEMPORARY PACEMAKER;  Surgeon: Corky Crafts, MD;  Location: [facility] INVASIVE CV LAB;  Service: Cardiovascular;  Laterality: N/A;   Video bronchoscopy and video mediastinoscopy  2008    Assessment & Plan Clinical Impression: Jeremy Lopez is a 72 year old male who presented to ED with disequilibrium, right sided weakness with paresthesias on 07/05/2023. Last known normal 2 AM on 07/06/2023. CT head with noted chronic ventriculomegaly. CT angiogram head/neck with no large vessel occlusion. MRI brain without contrast with acute versus early subacute infarct medial inferior right cerebellum. LDL 34, hemoglobin A1c 5.7. TTE with LVEF 60 to 65%, mild LVH, grade 1 diastolic dysfunction, IVC normal in size. UDS negative. EtOH level less than 10. Seen by neurology with recommendation of aspirin and Plavix x 3 weeks followed by aspirin alone.  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 Marjory Lies. He will have LP rescheduled after 3 weeks of DAPT.  PMH significant for lung cancer (2008), CAD s/p DES 2010, 2021, hypertension, chronic HFpEF. Patient with notable improvement in seated balance, only slight right lateral lean and able to self correct once he starts to drift. Standing NMRE requires BIL UE support and intermittent Mod assist for LOB towards right. Gait up to 65 feet on 10/03, emphasizing dynamic midline awareness, gait symmetry, step length, and RW placement. Requires up to mod assist for LOB towards  right. Good awareness of deficit but challenging for patient to correct. The patient requires inpatient medicine and rehabilitation evaluations and services for ongoing dysfunction secondary to right cerebellar  infarct.   Patient transferred to CIR on 07/13/2023 .    Patient currently requires min with basic self-care skills secondary to muscle weakness and ataxia.  Prior to hospitalization, patient could complete BADL/IADL with independent  to Mod I.  Patient will benefit from skilled intervention to decrease level of assist with basic self-care skills prior to discharge home with care partner.  Anticipate patient will require 24 hour supervision and no further OT follow recommended.  OT - End of Session Activity Tolerance: Tolerates 30+ min activity with multiple rests Endurance Deficit: Yes OT Assessment Rehab Potential (ACUTE ONLY): Good OT Patient demonstrates impairments in the following area(s): Balance;Endurance;Other (Comment) (UB strength) OT Advanced ADL's Functional Problem(s): Simple Meal Preparation OT Additional Impairment(s): Other (comment) (LB dressing) OT Plan OT Frequency: Total of 15 hours over 7 days of combined therapies;5 out of 7 days OT Duration/Estimated Length of Stay: 7-10 days OT Treatment/Interventions: Warden/ranger;Therapeutic Exercise;Neuromuscular re-education;UE/LE Coordination activities;UE/LE Strength taining/ROM;Therapeutic Activities OT Self Feeding Anticipated Outcome(s): ModI OT Basic Self-Care Anticipated Outcome(s): Mod I OT Toileting Anticipated Outcome(s): ModI OT Bathroom Transfers Anticipated Outcome(s): ModI OT Recommendation Patient destination: Home Follow Up Recommendations: None Equipment Recommended: Rolling walker with 5" wheels;3 in 1 bedside comode   OT Evaluation Precautions/Restrictions  Precautions Precautions: Fall Precaution Comments: R hemi, list right. Restrictions Weight Bearing Restrictions: No General Chart Reviewed: Yes Family/Caregiver Present: No Vital Signs Therapy Vitals Pulse Rate: 77 BP: 130/63 Patient Position (if appropriate): Lying Oxygen Therapy SpO2: 95 % O2 Device: Room Air Pain Pain  Assessment Pain Scale: 0-10 Pain Score: 0-No pain Home Living/Prior Functioning Home Living Family/patient expects to be discharged to:: Private residence Living Arrangements: Spouse/significant other Available Help at Discharge: Family Type of Home: House (Two Story) Home Access: Stairs to enter (2  at front entrance, pt enters from side entrance no stares he plans to reside downstairs.) Secretary/administrator of Steps: 2 Entrance Stairs-Rails: None Home Layout: One level (Primary entrance pt intends to use.) Bathroom Shower/Tub: Walk-in shower (no grab bars.) Bathroom Toilet: Handicapped height Bathroom Accessibility:  (reported lg door entry)  Lives With: Spouse IADL History Homemaking Responsibilities: Yes Meal Prep Responsibility: Primary (He cooks most meal and wife cooks on sometime) Current License: Yes Mode of Transportation: Set designer Occupation: Retired (7 years) Prior Function Level of Independence: Independent with transfers, Requires assistive device for independence  Able to Take Stairs?: Reciprically Driving: Yes Vision Baseline Vision/History: 1 Wears glasses (readers) Ability to See in Adequate Light: 0 Adequate Patient Visual Report: No change from baseline Vision Assessment?: No apparent visual deficits Perception  Perception: Not tested Praxis   Cognition Cognition Overall Cognitive Status: Within Functional Limits for tasks assessed Arousal/Alertness: Awake/alert Attention: Sustained Awareness: Appears intact Problem Solving: Appears intact Safety/Judgment: Appears intact Brief Interview for Mental Status (BIMS) Repetition of Three Words (First Attempt): 3 Temporal Orientation: Year: Correct Temporal Orientation: Month: Accurate within 5 days Temporal Orientation: Day: Correct Recall: "Sock": Yes, no cue required Recall: "Blue": Yes, no cue required Recall: "Bed": Yes, after cueing ("a piece of furniture") BIMS Summary Score:  14 Sensation Sensation Light Touch: Appears Intact Coordination Gross Motor Movements are Fluid and Coordinated: No Fine Motor Movements are Fluid and Coordinated: No (challenges secondary to gen weakness) Heel Shin Test: slightly decreased on Right. Motor  Motor Motor: Hemiplegia Motor - Skilled Clinical Observations:  slight R hemi, strength remains WFL.  Trunk/Postural Assessment  Cervical Assessment Cervical Assessment: Within Functional Limits Thoracic Assessment Thoracic Assessment: Within Functional Limits Lumbar Assessment Lumbar Assessment: Exceptions to [facility] Postural Control Postural Control:  (Right lean) Protective Responses: delayed, aware of list/lean to right but requires A to regain.  Balance Balance Balance Assessed: Yes Static Sitting Balance Static Sitting - Balance Support: Feet supported (foot rest in place) Static Sitting - Level of Assistance: 5: Stand by assistance (RW) Dynamic Sitting Balance Dynamic Sitting - Balance Support: Feet supported Dynamic Sitting - Level of Assistance: 5: Stand by assistance Sitting balance - Comments: Minimal Rt lean. Static Standing Balance Static Standing - Balance Support:  (using RW) Static Standing - Level of Assistance: 4: Min assist Dynamic Standing Balance Dynamic Standing - Balance Support: Bilateral upper extremity supported Dynamic Standing - Level of Assistance: 4: Min assist Extremity/Trunk Assessment RUE Assessment RUE Assessment: Within Functional Limits Passive Range of Motion (PROM) Comments: WFL Active Range of Motion (AROM) Comments: WFL General Strength Comments: 3+/5MMT LUE Assessment LUE Assessment: Within Functional Limits Passive Range of Motion (PROM) Comments: WFL Active Range of Motion (AROM) Comments: WFL General Strength Comments: 4/5MMT  Care Tool Care Tool Self Care Eating   Eating Assist Level: Set up assist    Oral Care    Oral Care Assist Level: Set up assist    Bathing    Body parts bathed by patient: Right arm;Left arm;Chest;Abdomen;Front perineal area;Buttocks;Right upper leg;Left upper leg;Right lower leg;Left lower leg;Face     Assist Level: Supervision/Verbal cueing    Upper Body Dressing(including orthotics)   What is the patient wearing?: Pull over shirt   Assist Level: Set up assist    Lower Body Dressing (excluding footwear)   What is the patient wearing?: Pants (Shorts) Assist for lower body dressing: Minimal Assistance - Patient > 75%    Putting on/Taking off footwear   What is the patient wearing?: Socks;Shoes Assist for footwear: Independent       Care Tool Toileting Toileting activity   Assist for toileting: Minimal Assistance - Patient > 75%     Care Tool Bed Mobility Roll left and right activity   Roll left and right assist level: Contact Guard/Touching assist (CGA to MinA)    Sit to lying activity   Sit to lying assist level: Contact Guard/Touching assist (CGA to MinA)    Lying to sitting on side of bed activity         Care Tool Transfers Sit to stand transfer   Sit to stand assist level: Contact Guard/Touching assist (CGA to MinA)    Chair/bed transfer   Chair/bed transfer assist level: Contact Guard/Touching assist (CGA to MinA)     Toilet transfer   Assist Level: Independent with assistive device Assistive Device Comment: RW   Care Tool Cognition  Expression of Ideas and Wants Expression of Ideas and Wants: 4. Without difficulty (complex and basic) - expresses complex messages without difficulty and with speech that is clear and easy to understand  Understanding Verbal and Non-Verbal Content Understanding Verbal and Non-Verbal Content: 4. Understands (complex and basic) - clear comprehension without cues or repetitions   Memory/Recall Ability Memory/Recall Ability : Current season;Location of own room;Staff names and faces   Refer to Care Plan for Long Term Goals  SHORT TERM GOAL WEEK 1 OT Short Term Goal  1 (Week 1): The pt will improve UB stength to 4+/5 MMT for increase Ind at 95% safe OT Short Term Goal 2 (  Week 1): The pt improve activity tolerance to good with occassional rest breaks at 95% safe. OT Short Term Goal 3 (Week 1): The pt will complete LB task in dressing at Ind LOF @ 95% safe OT Short Term Goal 4 (Week 1): The pt will improve static and dynamic balance during functional mobility to good at 95% safe. OT Short Term Goal 5 (Week 1): The pt will complete all functional transfers at ModI and 95% safe for safe transition to home.  Recommendations for other services: None    Skilled Therapeutic Intervention Patient seen this date for skilled OT evaluation, Patient pleasant with a willingness to participate.  Patient transfers from bed LOF to all surface incorporating the bed rail and using the RW with MinA secondary to challenges with functional balance.  The pt presents with slight  R lean when engaged in static  and dynamic balance  during sit and standing task requiring cues for self correcting.  The pt is able to  complete UB BADL related task with s/u assist, he requires additional assist with LB task in dressing at Min/ Mod A .The patient could benefit from skilled instruction in relation to UB strengthening, activity tolerance, and simple meal prep to improve his independence in the home and to reduce the burden of care for others.  The pt presents with good rehab potential and would benefit from 7-10 days on therapeutic interaction to maximize his transition to home.  I recommend 7-10 days of skilled OT services to improve his outcome with focus on the above mentioned areas of function.    ADL ADL Eating: Set up Where Assessed-Eating: Edge of bed;Wheelchair (based on observation) Grooming: Setup Where Assessed-Grooming: Edge of bed;Wheelchair Upper Body Bathing: Setup Where Assessed-Upper Body Bathing: Shower Lower Body Bathing: Minimal assistance Where Assessed-Lower Body  Bathing: Shower Upper Body Dressing: Setup Where Assessed-Upper Body Dressing: Wheelchair Lower Body Dressing: Minimal assistance Where Assessed-Lower Body Dressing: Wheelchair Toileting: Minimal assistance;Not assessed (based on observation) Toilet Transfer: Contact guard;Minimal assistance (based on shower transfer) Toilet Transfer Method: Ambulating (using RW) Tub/Shower Transfer: Contact guard;Minimal assistance (based on observation Min/ModA using sliding board) Tub/Shower Equipment: Shower seat with back;Grab bars Film/video editor: Scientist, forensic Method: Designer, industrial/product: Shower seat with back Mobility  Bed Mobility Bed Mobility: Rolling Right;Rolling Left;Supine to Sit;Sit to Supine Rolling Right: Contact Guard/Touching assist Rolling Left: Contact Guard/Touching assist;Minimal Assistance - Patient > 75% Supine to Sit: Contact Guard/Touching assist;Minimal Assistance - Patient > 75% Sit to Supine: Contact Guard/Touching assist;Minimal Assistance - Patient > 75% Transfers Sit to Stand: Contact Guard/Touching assist;Minimal Assistance - Patient > 75% Stand to Sit: Contact Guard/Touching assist;Minimal Assistance - Patient > 75%   Discharge Criteria: Patient will be discharged from OT if patient refuses treatment 3 consecutive times without medical reason, if treatment goals not met, if there is a change in medical status, if patient makes no progress towards goals or if patient is discharged from hospital.  The above assessment, treatment plan, treatment alternatives and goals were discussed and mutually agreed upon: by patient  Lavona Mound 07/14/2023, 7:07 PM

## 2023-07-15 DIAGNOSIS — I639 Cerebral infarction, unspecified: Secondary | ICD-10-CM

## 2023-07-15 LAB — CBC
HCT: 39.6 % (ref 39.0–52.0)
Hemoglobin: 13.2 g/dL (ref 13.0–17.0)
MCH: 30.9 pg (ref 26.0–34.0)
MCHC: 33.3 g/dL (ref 30.0–36.0)
MCV: 92.7 fL (ref 80.0–100.0)
Platelets: 356 10*3/uL (ref 150–400)
RBC: 4.27 MIL/uL (ref 4.22–5.81)
RDW: 14.2 % (ref 11.5–15.5)
WBC: 6.5 10*3/uL (ref 4.0–10.5)
nRBC: 0 % (ref 0.0–0.2)

## 2023-07-15 LAB — BASIC METABOLIC PANEL
Anion gap: 9 (ref 5–15)
BUN: 20 mg/dL (ref 8–23)
CO2: 25 mmol/L (ref 22–32)
Calcium: 8.3 mg/dL — ABNORMAL LOW (ref 8.9–10.3)
Chloride: 99 mmol/L (ref 98–111)
Creatinine, Ser: 1.19 mg/dL (ref 0.61–1.24)
GFR, Estimated: >60 mL/min (ref >60)
Glucose, Bld: 78 mg/dL (ref 70–99)
Potassium: 3.6 mmol/L (ref 3.5–5.1)
Sodium: 133 mmol/L — ABNORMAL LOW (ref 135–145)

## 2023-07-15 LAB — MAGNESIUM: Magnesium: 2 mg/dL (ref 1.7–2.4)

## 2023-07-15 MED ORDER — VITAMIN D 25 MCG (1000 UNIT) PO TABS
1000.0000 [IU] | ORAL_TABLET | Freq: Every day | ORAL | Status: DC
  Administered 2023-07-15 – 2023-07-16 (×2): 1000 [IU] via ORAL
  Filled 2023-07-15 (×2): qty 1

## 2023-07-15 MED ORDER — L-METHYLFOLATE-B6-B12 3-35-2 MG PO TABS
1.0000 | ORAL_TABLET | Freq: Every day | ORAL | Status: DC
  Administered 2023-07-15 – 2023-07-21 (×7): 1 via ORAL
  Filled 2023-07-15 (×7): qty 1

## 2023-07-15 NOTE — Plan of Care (Signed)
  Problem: Sit to Stand Goal: LTG:  Patient will perform sit to stand in prep for activites of daily living with assistance level (OT) Description: LTG:  Patient will perform sit to stand in prep for activites of daily living with assistance level (OT) Flowsheets (Taken 07/15/2023 1107) LTG: PT will perform sit to stand in prep for activites of daily living with assistance level: Independent with assistive device   Problem: RH Balance Goal: LTG Patient will maintain dynamic standing with ADLs (OT) Description: LTG:  Patient will maintain dynamic standing balance with assist during activities of daily living (OT)  Flowsheets (Taken 07/15/2023 1107) LTG: Pt will maintain dynamic standing balance during ADLs with: Independent with assistive device   Problem: Sit to Stand Goal: LTG:  Patient will perform sit to stand in prep for activites of daily living with assistance level (OT) Description: LTG:  Patient will perform sit to stand in prep for activites of daily living with assistance level (OT) Flowsheets (Taken 07/15/2023 1107) LTG: PT will perform sit to stand in prep for activites of daily living with assistance level: Independent with assistive device   Problem: RH Bathing Goal: LTG Patient will bathe all body parts with assist levels (OT) Description: LTG: Patient will bathe all body parts with assist levels (OT) Flowsheets (Taken 07/15/2023 1107) LTG: Pt will perform bathing with assistance level/cueing: Supervision/Verbal cueing   Problem: RH Dressing Goal: LTG Patient will perform upper body dressing (OT) Description: LTG Patient will perform upper body dressing with assist, with/without cues (OT). Flowsheets (Taken 07/15/2023 1107) LTG: Pt will perform upper body dressing with assistance level of: Independent Goal: LTG Patient will perform lower body dressing w/assist (OT) Description: LTG: Patient will perform lower body dressing with assist, with/without cues in positioning using  equipment (OT) Flowsheets (Taken 07/15/2023 1107) LTG: Pt will perform lower body dressing with assistance level of: Independent with assistive device   Problem: RH Toileting Goal: LTG Patient will perform toileting task (3/3 steps) with assistance level (OT) Description: LTG: Patient will perform toileting task (3/3 steps) with assistance level (OT)  Flowsheets (Taken 07/15/2023 1107) LTG: Pt will perform toileting task (3/3 steps) with assistance level: Independent with assistive device   Problem: RH Toilet Transfers Goal: LTG Patient will perform toilet transfers w/assist (OT) Description: LTG: Patient will perform toilet transfers with assist, with/without cues using equipment (OT) Flowsheets (Taken 07/15/2023 1107) LTG: Pt will perform toilet transfers with assistance level of: Independent with assistive device   Problem: RH Tub/Shower Transfers Goal: LTG Patient will perform tub/shower transfers w/assist (OT) Description: LTG: Patient will perform tub/shower transfers with assist, with/without cues using equipment (OT) Flowsheets (Taken 07/15/2023 1107) LTG: Pt will perform tub/shower stall transfers with assistance level of: Supervision/Verbal cueing

## 2023-07-15 NOTE — Progress Notes (Signed)
PROGRESS NOTE   Subjective/Complaints: Sleepy this morning Labs reviewed and stable. +hyponatremia Patient's chart reviewed- No issues reported overnight Vitals signs stable except for HTN  ROS: Patient denies fever, rash, sore throat, blurred vision, dizziness, nausea, vomiting, diarrhea,   shortness of breath or chest pain, joint or back/neck pain, headache, or mood change.    Objective:   No results found. Recent Labs    07/15/23 0556  WBC 6.5  HGB 13.2  HCT 39.6  PLT 356   Recent Labs    07/15/23 0556  NA 133*  K 3.6  CL 99  CO2 25  GLUCOSE 78  BUN 20  CREATININE 1.19  CALCIUM 8.3*    Intake/Output Summary (Last 24 hours) at 07/15/2023 1038 Last data filed at 07/15/2023 0639 Gross per 24 hour  Intake 480 ml  Output 1200 ml  Net -720 ml        Physical Exam: Vital Signs Blood pressure (!) 156/86, pulse 79, temperature 98.2 F (36.8 C), resp. rate 17, height 5\' 10"  (1.778 m), weight 92.6 kg, SpO2 93%.  Gen: no distress, normal appearing HEENT: oral mucosa pink and moist, NCAT Cardio: Reg rate Chest: normal effort, normal rate of breathing Abdomen: Soft, non-tender, non-distended, bowel sounds positive. Extremities: No clubbing, cyanosis, or edema. Pulses are 2+ Psych: Pt's affect is appropriate. Pt is cooperative Skin: Clean and intact without signs of breakdown Neuro:  Alert and oriented x 3. Normal insight and awareness. Intact Memory. Normal language and speech. Cranial nerve exam unremarkable. MMT: 5/5 LUE and LLE. 4+/5 RUE and RLE. Mild right limb ataxia. Sensory exam normal for light touch and pain in all 4 limbs.  No abnormal tone appreciated.  .   Musculoskeletal: Full ROM, No pain with AROM or PROM in the neck, trunk, or extremities. Posture appropriate     Assessment/Plan: 1. Functional deficits which require 3+ hours per day of interdisciplinary therapy in a comprehensive inpatient  rehab setting. Physiatrist is providing close team supervision and 24 hour management of active medical problems listed below. Physiatrist and rehab team continue to assess barriers to discharge/monitor patient progress toward functional and medical goals  Care Tool:  Bathing    Body parts bathed by patient: Right arm, Left arm, Chest, Abdomen, Front perineal area, Buttocks, Right upper leg, Left upper leg, Right lower leg, Left lower leg, Face         Bathing assist Assist Level: Supervision/Verbal cueing     Upper Body Dressing/Undressing Upper body dressing   What is the patient wearing?: Pull over shirt    Upper body assist Assist Level: Set up assist    Lower Body Dressing/Undressing Lower body dressing      What is the patient wearing?: Pants (Shorts)     Lower body assist Assist for lower body dressing: Minimal Assistance - Patient > 75%     Toileting Toileting    Toileting assist Assist for toileting: Minimal Assistance - Patient > 75%     Transfers Chair/bed transfer  Transfers assist     Chair/bed transfer assist level: Contact Guard/Touching assist (CGA to MinA)     Locomotion Ambulation   Ambulation assist  Assist level: Moderate Assistance - Patient 50 - 74% Assistive device: No Device Max distance: 90   Walk 10 feet activity   Assist     Assist level: Moderate Assistance - Patient - 50 - 74% Assistive device: No Device   Walk 50 feet activity   Assist    Assist level: Moderate Assistance - Patient - 50 - 74% Assistive device: No Device    Walk 150 feet activity   Assist Walk 150 feet activity did not occur: Safety/medical concerns    Assistive device: Walker-rolling    Walk 10 feet on uneven surface  activity   Assist     Assist level: Minimal Assistance - Patient > 75% Assistive device: Other (comment) (no device.)   Wheelchair     Assist Is the patient using a wheelchair?: Yes Type of Wheelchair:  Manual (during dressing)    Wheelchair assist level: Supervision/Verbal cueing Max wheelchair distance: 50    Wheelchair 50 feet with 2 turns activity    Assist        Assist Level: Supervision/Verbal cueing   Wheelchair 150 feet activity     Assist      Assist Level: Maximal Assistance - Patient 25 - 49%   Blood pressure (!) 156/86, pulse 79, temperature 98.2 F (36.8 C), resp. rate 17, height 5\' 10"  (1.778 m), weight 92.6 kg, SpO2 93%.  Medical Problem List and Plan: 1. Functional deficits secondary to right medial inferior cerebellar infarct.             -hx of gait disorder, ?NPH with shuffling, forward leaning gait but was distinctly different than what he's experienced after this stroke.             -patient may shower             -ELOS/Goals: 10-12 days, mod I goals with PT and OT   -Patient is beginning CIR therapies today including PT and OT   Metanx started 2.  Antithrombotics: -DVT/anticoagulation:  Pharmaceutical: Lovenox             -antiplatelet therapy: Aspirin and Plavix for 3 weeks then plavix alone given pt's hx of CAD (cards/neuro on board)   3. Pain Management: Tylenol as needed             -continue gabapentin 300 mg q HS   4. Mood/Behavior/Sleep: LCSW to evaluate and provide emotional support             -continue escitalopram 10 mg daily             -continue home trazodone 50 mg q HS--sleeping well             -antipsychotic agents: n/a              5. Neuropsych/cognition: This patient is capable of making decisions on his own behalf.   6. Skin/Wound Care: Routine skin care checks   7. Fluids/Electrolytes/Nutrition: Routine Is and Os and follow-up chemistries   8: Hypertension: monitor TID and prn             -continue metoprolol succinate 100 mg daily             -continue diltiazem 240 mg daily  -add on magnesium supplement   9: Hyperlipidemia: continue statin   10: History of CAD; on aspirin and statin  -continue plavix as  well             -follow-up with Dr. Dietrich Pates  11: History lung cancer 2008   12: Asthmatic bronchitis/former smoker, slight exacerbation (at home uses Trelegy/Ventolin)             -continue Breo/Incruse inhalers             -continue mucinex dm which seems to have helped             -prn duoneb, hycodan             -follow-up with Dr. Tonia Brooms   12: Gait disturbance: recent work-up as outpatient by Dr. Marjory Lies             -to re-schedule LP for findings of ventriculomegaly/NPH   -plavix now long term, so this will need to be considered in scheduling procedure.  14: Chronic HFpEF: check daily weights, messaged nursing to please weigh patient today    Filed Weights   07/13/23 1600 07/14/23 0553  Weight: 95.7 kg 92.6 kg    15. Hyponatremia: continue to monitor sodium  16. Suboptimal vitamin D: start daily D3 supplement LOS: 2 days A FACE TO FACE EVALUATION WAS PERFORMED  Jeremy Lopez Jeremy Lopez 07/15/2023, 10:38 AM

## 2023-07-15 NOTE — Progress Notes (Signed)
Inpatient Rehabilitation Care Coordinator Assessment and Plan Patient Details  Name: Jeremy Lopez MRN: [id number] Date of Birth: 01-Feb-1951  Today's Date: 07/15/2023  Hospital Problems: Principal Problem:   Acute CVA (cerebrovascular accident) [facility])  Past Medical History:  Past Medical History:  Diagnosis Date   CAD (coronary artery disease), native coronary artery    DES proximal LAD 07/2009; DES to RCA 04/2020   Chronic heart failure with preserved ejection fraction (HFpEF) (HCC)    Essential hypertension    Habitual alcohol use    Hyperlipidemia    PVC's (premature ventricular contractions)    Small cell lung cancer (HCC)    Right upper lobe 2008; no evidence of disease since treatment ended in 11/2007   Past Surgical History:  Past Surgical History:  Procedure Laterality Date   CORONARY ATHERECTOMY N/A 04/26/2020   Procedure: CORONARY ATHERECTOMY;  Surgeon: Corky Crafts, MD;  Location: [facility] INVASIVE CV LAB;  Service: Cardiovascular;  Laterality: N/A;   CORONARY STENT INTERVENTION N/A 04/26/2020   Procedure: CORONARY STENT INTERVENTION;  Surgeon: Corky Crafts, MD;  Location: [facility] INVASIVE CV LAB;  Service: Cardiovascular;  Laterality: N/A;   CORONARY ULTRASOUND/IVUS N/A 04/26/2020   Procedure: Intravascular Ultrasound/IVUS;  Surgeon: Corky Crafts, MD;  Location: [facility] INVASIVE CV LAB;  Service: Cardiovascular;  Laterality: N/A;   Insertion of left subclavian Port-A-Cath  2008   LEFT HEART CATH AND CORONARY ANGIOGRAPHY N/A 07/03/2022   Procedure: LEFT HEART CATH AND CORONARY ANGIOGRAPHY;  Surgeon: [place], Peter M, MD;  Location: [facility] INVASIVE CV LAB;  Service: Cardiovascular;  Laterality: N/A;   RIGHT/LEFT HEART CATH AND CORONARY ANGIOGRAPHY N/A 04/26/2020   Procedure: RIGHT/LEFT HEART CATH AND CORONARY ANGIOGRAPHY;  Surgeon: Corky Crafts, MD;  Location: [facility] INVASIVE CV LAB;  Service: Cardiovascular;  Laterality: N/A;   TEMPORARY PACEMAKER N/A 04/26/2020    Procedure: TEMPORARY PACEMAKER;  Surgeon: Corky Crafts, MD;  Location: [facility] INVASIVE CV LAB;  Service: Cardiovascular;  Laterality: N/A;   Video bronchoscopy and video mediastinoscopy  2008   Social History:  reports that he quit smoking about 24 years ago. His smoking use included cigarettes. He started smoking about 56 years ago. He has never used smokeless tobacco. He reports current alcohol use of about 14.0 standard drinks of alcohol per week. He reports current drug use. Drug: Marijuana.  Family / Support Systems Marital Status: Married Patient Roles: Spouse, Parent, Other (Comment) (retiree) Spouse/Significant Other: Jeremy Lopez [phone] Children: Jeremy Lopez [phone]  Jeremy Lopez-[phone] Other Supports: Friends Anticipated Caregiver: Jeremy Lopez Ability/Limitations of Caregiver: Wife is retired and in good Hotel manager Availability: 24/7 Family Dynamics: Close with family and children will come by to check on them and make sure they have what they need.  Social History Preferred language: English Religion: [employer] Cultural Background: no issues Education: HS Health Literacy - How often do you need to have someone help you when you read instructions, pamphlets, or other written material from your doctor or pharmacy?: Never Writes: Yes Employment Status: Retired Marine scientist Issues: No issues Guardian/Conservator: None-according to MD pt is capable of making his own decisions while here   Abuse/Neglect Abuse/Neglect Assessment Can Be Completed: Yes Physical Abuse: Denies Verbal Abuse: Denies Sexual Abuse: Denies Exploitation of patient/patient's resources: Denies Self-Neglect: Denies  Patient response to: Social Isolation - How often do you feel lonely or isolated from those around you?: Never  Emotional Status Pt's affect, behavior and adjustment status: Pt is motivated to do well and recover from this stroke. he feels  he is doing better and will be ready  to go home soon. Pt was independent prior to admission Recent Psychosocial Issues: other health issues Psychiatric History: No history seems to be coping appropriately and doing well He is one who relies upon himself and his resources. Substance Abuse History: No issues  Patient / Family Perceptions, Expectations & Goals Pt/Family understanding of illness & functional limitations: Pt can explain his stroke and deficits. He does talk with the MD rounding and feels his questions have been answered and concerns are being addressed Premorbid pt/family roles/activities: husband, father, retiree, friend, neighbor, etc Anticipated changes in roles/activities/participation: resume Pt/family expectations/goals: Pt states: " I hope to do well and get back on my feet again."  [employer]: None Premorbid Home Care/DME Agencies: None Transportation available at discharge: self and wife Is the patient able to respond to transportation needs?: Yes In the past 12 months, has lack of transportation kept you from medical appointments or from getting medications?: No In the past 12 months, has lack of transportation kept you from meetings, work, or from getting things needed for daily living?: No  Discharge Planning Living Arrangements: Spouse/significant other Support Systems: Spouse/significant other, Children, Friends/neighbors, Other relatives, Church/faith community Type of Residence: Private residence Insurance Resources: Media planner (specify) (Health Team [employer]vant[employer]) Financial Resources: Social Security Financial Screen Referred: No Living Expenses: Lives with family Money Management: Patient, Spouse Does the patient have any problems obtaining your medications?: No Home Management: both Patient/Family Preliminary Plans: Return home with wife who is able to assist him if needed. He is hopeful he will not be here long and be able to go home soon. Aware team conference  on Wednesday and target dischsrge date set Care Coordinator Anticipated Follow Up Needs: HH/OP  Clinical Impression Pleasant gentleman who seems to do what he wants to do He is trying to quoit smoking and has gum on the bedside.Will update once have team conference and target date. Wife involved and willing to assist him   Lucy Chris 07/15/2023, 10:24 AM

## 2023-07-15 NOTE — Discharge Summary (Signed)
Physician Discharge Summary  Patient ID: Jeremy Lopez MRN: [id number] DOB/AGE: 01/17/1951 72 y.o.  Admit date: 07/13/2023 Discharge date: 07/21/2023  Discharge Diagnoses:  Principal Problem:   Acute CVA (cerebrovascular accident) [facility]) Active problems: Functional deficits secondary to right medial inferior cerebellar infarct History of gait disorder History of coronary artery disease History of depression History of hypertension History of hyperlipidemia History of lung cancer Asthmatic bronchitis History of chronic heart failure  Discharged Condition: good  Significant Diagnostic Studies:  Labs:  Basic Metabolic Panel: No results for input(s): "NA", "K", "CL", "CO2", "GLUCOSE", "BUN", "CREATININE", "CALCIUM", "MG", "PHOS" in the last 168 hours.   CBC: No results for input(s): "WBC", "NEUTROABS", "HGB", "HCT", "MCV", "PLT" in the last 168 hours.   Brief HPI:   Jeremy Lopez is a 72 y.o. male who presented to ED with disequilibrium, right sided weakness with paresthesias on 07/05/2023. Last known normal 2 AM on 07/06/2023. CT head with noted chronic ventriculomegaly. CT angiogram head/neck with no large vessel occlusion. MRI brain without contrast with acute versus early subacute infarct medial inferior right cerebellum. LDL 34, hemoglobin A1c 5.7. TTE with LVEF 60 to 65%, mild LVH, grade 1 diastolic dysfunction, IVC normal in size. UDS negative. EtOH level less than 10. Seen by neurology with recommendation of aspirin and Plavix x 3 weeks followed by aspirin alone. ** Gait disturbance for ~11 months and work-up recently with concern for NPH, chronic in nature and followed by Jeremy Lopez. He will have LP rescheduled after 3 weeks of DAPT.  PMH significant for lung cancer (2008), CAD s/p DES 2010, 2021, hypertension, chronic HFpEF. Patient with notable improvement in seated balance, only slight right lateral lean and able to self correct once he starts to drift. Standing NMRE  requires BIL UE support and intermittent Mod assist for LOB towards right. Gait up to 65 feet on 10/03, emphasizing dynamic midline awareness, gait symmetry, step length, and RW placement. Requires up to mod assist for LOB towards right. Good awareness of deficit but challenging for patient to correct.    Hospital Course: Jeremy Lopez was admitted to rehab 07/13/2023 for inpatient therapies to consist of PT, ST and OT at least three hours five days a week. Past admission physiatrist, therapy team and rehab RN have worked together to provide customized collaborative inpatient rehab. Mild hyponatremia on follow-up labs>>trended. Qutenza patches scheduled for outpatient therapy. He is on gabapentin 300mg  HS and his neuropathy is currently more of a sensory deficit than pain. Chronic cough and chest x-ray updated on 10/10>>stable, no acute findings.   Blood pressures were monitored on TID basis and Toprol all succinate 100 mg daily and diltiazem 240 mg daily continued. Magnesium supplement added at night for elevated systolic BP.  Rehab course: During patient's stay in rehab weekly team conferences were held to monitor patient's progress, set goals and discuss barriers to discharge. At admission, patient required min assist with mobility and with basic self-care skills.  He has had improvement in activity tolerance, balance, postural control as well as ability to compensate for deficits. He has had improvement in functional use RUE/LUE  and RLE/LLE as well as improvement in awareness. Patient has met 9 of 9 long term goals due to improved activity tolerance, improved balance, postural control, ability to compensate for deficits, functional use of  RIGHT lower extremity, and improved coordination.  Patient to discharge at overall Modified Independent level.  Patient's care partner is independent to provide the necessary physical assistance at discharge.  Outpatient OT and PT arranged at [facility]  Discharge disposition: 01-Home or Self Care     **Neurology discussed with Jeremy Lopez on 10/05 and the patient's cardiologist DAPT therapy and concluded to remain on Plavix for monotherapy once 3 weeks of DAPT completed.    Diet: Heart healthy  Special Instructions: No driving, alcohol consumption or tobacco use.  30-35 minutes were spent on discharge planning and discharge summary.  Discharge Instructions     Ambulatory referral to Neurology   Complete by: As directed    An appointment is requested in approximately: 4 weeks   Ambulatory referral to Physical Medicine Rehab   Complete by: As directed    Hospital follow-up      Allergies as of 07/21/2023       Reactions   Cephalexin Anaphylaxis   throat swelling Patient said he can take amoxicillin   Amlodipine Swelling   Leg swelling        Medication List     STOP taking these medications    albuterol 108 (90 Base) MCG/ACT inhaler Commonly known as: VENTOLIN HFA   diltiazem 240 MG 24 hr capsule Commonly known as: CARDIZEM CD   empagliflozin 10 MG Tabs tablet Commonly known as: Jardiance   prochlorperazine 5 MG tablet Commonly known as: COMPAZINE       TAKE these medications    acetaminophen 325 MG tablet Commonly known as: TYLENOL Take 1-2 tablets (325-650 mg total) by mouth every 4 (four) hours as needed for mild pain. What changed:  medication strength how much to take when to take this reasons to take this   aspirin EC 81 MG tablet Take 1 tablet (81 mg total) by mouth daily for 4 days. Swallow whole.   azithromycin 250 MG tablet Commonly known as: ZITHROMAX One tablet by mouth daily until finished   chlorpheniramine-HYDROcodone 10-8 MG/5ML Commonly known as: TUSSIONEX Take 5 mLs by mouth every 12 (twelve) hours as needed for cough.   clopidogrel 75 MG tablet Commonly known as: PLAVIX Take 1 tablet (75 mg total) by mouth daily. What changed: when to take this    dextromethorphan-guaiFENesin 30-600 MG 12hr tablet Commonly known as: MUCINEX DM Take 1 tablet by mouth 2 (two) times daily.   guaiFENesin-dextromethorphan 100-10 MG/5ML syrup Commonly known as: ROBITUSSIN DM Take 10 mLs by mouth every 6 (six) hours as needed for cough.   Elfolate Plus 3-35-2 MG Tabs Take 1 tablet by mouth daily.   escitalopram 10 MG tablet Commonly known as: Lexapro Take 1 tablet (10 mg total) by mouth daily.   gabapentin 300 MG capsule Commonly known as: NEURONTIN Take 1 capsule (300 mg total) by mouth at bedtime.   loratadine 10 MG tablet Commonly known as: CLARITIN Take 1 tablet (10 mg total) by mouth daily.   magnesium oxide 400 MG tablet Commonly known as: MAG-OX Take 1.5 tablets (600 mg total) by mouth at bedtime.   metoprolol succinate 50 MG 24 hr tablet Commonly known as: TOPROL-XL Take 1 tablet (50 mg total) by mouth daily. Take with or immediately following a meal.   nitroGLYCERIN 0.4 MG SL tablet Commonly known as: Nitrostat Place 1 tablet (0.4 mg total) under the tongue every 5 (five) minutes as needed for chest pain.   Qutenza (4 Patch) 8 % Generic drug: capsaicin topical system Apply 4 patches topically once for 1 dose. Start taking on: August 22, 2023 What changed: These instructions start on August 22, 2023. If you are unsure what to  do until then, ask your doctor or other care provider.   rosuvastatin 40 MG tablet Commonly known as: CRESTOR TAKE ONE (1) TABLET BY MOUTH EVERY DAY   traZODone 50 MG tablet Commonly known as: DESYREL Take 1 tablet (50 mg total) by mouth at bedtime.   Trelegy Ellipta 100-62.5-25 MCG/ACT Aepb Generic drug: Fluticasone-Umeclidin-Vilant Inhale 1 puff into the lungs daily.   Vitamin D 50 MCG (2000 UT) tablet Take 2,000 Units by mouth daily.        Follow-up Information     Raulkar, Drema Pry, MD. Go to.   Specialty: Physical Medicine and Rehabilitation Contact information: [address] [place] [address] [phone]         [name], MD Follow up.   Specialty: Internal Medicine Contact information: [address] [place] [address] [phone]         Suanne Marker, MD Follow up.   Specialties: Neurology, Radiology Contact information: [address] [place] [address] [phone]         Pricilla Riffle, MD. Go to.   Specialty: Cardiology Contact information: [address] [place] [address] [phone]         Audie Box L, DO Follow up.   Specialty: Pulmonary Disease Why: As needed Contact information: [address] [place] [address] [phone]                 Signed: Milinda Antis 07/22/2023, 10:18 AM

## 2023-07-15 NOTE — Progress Notes (Signed)
Occupational Therapy Session Note  Patient Details  Name: Jeremy Lopez MRN: [id number] Date of Birth: 09/26/1951  Today's Date: 07/15/2023 OT Individual Time: 4098-1191 OT Individual Time Calculation (min): 73 min    Short Term Goals: Week 1:  OT Short Term Goal 1 (Week 1): The pt will improve UB stength to 4+/5 MMT for increase Ind at 95% safe OT Short Term Goal 2 (Week 1): The pt improve activity tolerance to good with occassional rest breaks at 95% safe. OT Short Term Goal 3 (Week 1): The pt will complete LB task in dressing at Ind LOF @ 95% safe OT Short Term Goal 4 (Week 1): The pt will improve static and dynamic balance during functional mobility to good at 95% safe. OT Short Term Goal 5 (Week 1): The pt will complete all functional transfers at ModI and 95% safe for safe transition to home.  Skilled Therapeutic Interventions/Progress Updates:    Pt greeted semi-reclined in bed awake and agreeable to OT treatment session. Pt ambulated with RW and CGA with min cues for upright posture. Standing balance/endurance using foam block for balance challenge. Pt completed 3 sets of 10 chest press, bicep curl, and straight arm raise. Cues for correcting lateral lean to the R. Pt stood on foam and completed 3 sets of 10 overhead press and chest press with tidal tank. 3 sets of 10 squats with CGA/min A. Pt then ambulated 100 feet with RW and min A with cues for maintaining midline and stepping through with R LE. Pt returned to room and left seated in recliner with alarm on, call bell in reach, and needs met.   Therapy Documentation Precautions:  Precautions Precautions: Fall Precaution Comments: R hemi, list right. Restrictions Weight Bearing Restrictions: No  Pain:  Denies pain   Therapy/Group: Individual Therapy  Mal Amabile 07/15/2023, 10:34 AM

## 2023-07-15 NOTE — Progress Notes (Signed)
Inpatient Rehabilitation  Patient information reviewed and entered into eRehab system by Oyuki Hogan M. Eri Mcevers, M.A., CCC/SLP, PPS Coordinator.  Information including medical coding, functional ability and quality indicators will be reviewed and updated through discharge.    

## 2023-07-15 NOTE — Discharge Instructions (Addendum)
Inpatient Rehab Discharge Instructions  Jeremy Lopez Discharge date and time: 07/21/2023  Activities/Precautions/ Functional Status: Activity: no lifting, driving, or strenuous exercise until cleared by MD Diet: cardiac diet Wound Care: none needed Functional status:  ___ No restrictions     ___ Walk up steps independently ___ 24/7 supervision/assistance   ___ Walk up steps with assistance _x__ Intermittent supervision/assistance  ___ Bathe/dress independently ___ Walk with walker     ___ Bathe/dress with assistance ___ Walk Independently    ___ Shower independently ___ Walk with assistance    __x_ Shower with assistance __x_ No alcohol     ___ Return to work/school ________  Special Instructions: No driving, alcohol consumption or tobacco use.   COMMUNITY REFERRALS UPON DISCHARGE:      Outpatient: PT  & OT              Agency: [facility] Phone: [phone]             Appointment Date/Time:WILL CALL WIFE OR PATIENT TO SCHEDULE FOLLOW UP THERAPIES  Medical Equipment/Items Ordered: NO NEEDS                                                 Agency/Supplier:NA    STROKE/TIA DISCHARGE INSTRUCTIONS SMOKING Cigarette smoking nearly doubles your risk of having a stroke & is the single most alterable risk factor  If you smoke or have smoked in the last 12 months, you are advised to quit smoking for your health. Most of the excess cardiovascular risk related to smoking disappears within a year of stopping. Ask you doctor about anti-smoking medications [place] Quit Line: 1-800-QUIT NOW Free Smoking Cessation Classes [phone]  CHOLESTEROL Know your levels; limit fat & cholesterol in your diet  Lipid Panel     Component Value Date/Time   CHOL 98 07/06/2023 0602   CHOL 162 01/18/2023 1053   TRIG 109 07/06/2023 0602   HDL 42 07/06/2023 0602   HDL 54 01/18/2023 1053   CHOLHDL 2.3 07/06/2023 0602   VLDL 22 07/06/2023 0602   LDLCALC 34 07/06/2023 0602   LDLCALC 76  01/18/2023 1053   LDLCALC 61 12/28/2019 1501     Many patients benefit from treatment even if their cholesterol is at goal. Goal: Total Cholesterol (CHOL) less than 160 Goal:  Triglycerides (TRIG) less than 150 Goal:  HDL greater than 40 Goal:  LDL (LDLCALC) less than 100   BLOOD PRESSURE American Stroke Association blood pressure target is less that 120/80 mm/Hg  Your discharge blood pressure is:  BP: 120/60 Monitor your blood pressure Limit your salt and alcohol intake Many individuals will require more than one medication for high blood pressure  DIABETES (A1c is a blood sugar average for last 3 months) Goal HGBA1c is under 7% (HBGA1c is blood sugar average for last 3 months)  Diabetes: No known diagnosis of diabetes    Lab Results  Component Value Date   HGBA1C 5.7 (H) 07/05/2023    Your HGBA1c can be lowered with medications, healthy diet, and exercise. Check your blood sugar as directed by your physician Call your physician if you experience unexplained or low blood sugars.  PHYSICAL ACTIVITY/REHABILITATION Goal is 30 minutes at least 4 days per week  Activity: Increase activity slowly, Therapies: Physical Therapy: Outpatient and Occupational Therapy: Outpatient Return to work: When cleared by MD  Activity decreases your risk of heart attack and stroke and makes your heart stronger.  It helps control your weight and blood pressure; helps you relax and can improve your mood. Participate in a regular exercise program. Talk with your doctor about the best form of exercise for you (dancing, walking, swimming, cycling).  DIET/WEIGHT Goal is to maintain a healthy weight  Your discharge diet is:  Diet Order             Diet Heart Room service appropriate? Yes; Fluid consistency: Thin  Diet effective now                 Thin liquids Your height is:  Height: 5\' 10"  (177.8 cm) Your current weight is: Weight: 94.1 kg Your Body Mass Index (BMI) is:  BMI (Calculated): 29.76  Following the type of diet specifically designed for you will help prevent another stroke. Your goal weight range is:   Your goal Body Mass Index (BMI) is 19-24. Healthy food habits can help reduce 3 risk factors for stroke:  High cholesterol, hypertension, and excess weight.  RESOURCES Stroke/Support Group:  Call [phone]   STROKE EDUCATION PROVIDED/REVIEWED AND GIVEN TO PATIENT Stroke warning signs and symptoms How to activate emergency medical system (call 911). Medications prescribed at discharge. Need for follow-up after discharge. Personal risk factors for stroke. Pneumonia vaccine given: No Flu vaccine given: No My questions have been answered, the writing is legible, and I understand these instructions.  I will adhere to these goals & educational materials that have been provided to me after my discharge from the hospital.     My questions have been answered and I understand these instructions. I will adhere to these goals and the provided educational materials after my discharge from the hospital.  Patient/Caregiver Signature _______________________________ Date __________  Clinician Signature _______________________________________ Date __________  Please bring this form and your medication list with you to all your follow-up doctor's appointments.

## 2023-07-15 NOTE — Progress Notes (Signed)
Physical Therapy Session Note  Patient Details  Name: Jeremy Lopez MRN: [id number] Date of Birth: 07/05/1951  Today's Date: 07/15/2023 PT Individual Time: 7829-5621 PT Individual Time Calculation (min): 71 min   Short Term Goals: Week 1:  PT Short Term Goal 1 (Week 1): STG=LTG 2/2 ELOS.  Skilled Therapeutic Interventions/Progress Updates:    Pt presents in room in recliner, motivated to participate with PT, denies pain at this time. Session focused on gait training with and without RW as well as NMR to promote improved BLE coordination, midline orientation, and education regarding area of stroke in brain and resulting effects of stroke on balance and equilibrium. Pt completes sit<>stand transfers with RW CGA with and without RW throughout session.  Pt ambulates with RW from room to main gym ~100' with CGA/min assist with occasional RLE scissoring and decreased step length with insufficient weightshift to LLE, improves with cues. Pt then completes gait training without device 50' with min assist, decreased step length on RLE insufficient weight shift, cues to correct. Pt completes side stepping 3x15' bilaterally to promote lateral weightshift and foot placement during forward gait as well as improved postural stability with multidirectional stepping. Pt completes gait training with resistive walking 8x30', therapist guarding 2nd person providing anterior>posterior and lateral resistance with blue band around hips to facilitate muscle fiber recruitment for correcting posture to midline with dynamic mobility. Pt demonstrates decreased ability to complete postural correction with resistance pulled from L to R however improves with trials and cueing. Pt then ambulates 190' without device demonstrates improves stability CGA/min assist for task but does continue to require cues for L lateral weightshift and increased RLE step length, improved weightshift with cues for "heel strike" on R foot. Pt completes  gait training 4x50' with reverse duct tape on R foot to facilitate improved foot clearance and step length, visual cue of mirror placed in front of pt with pt demonstrating improved midline with external cues.  Pt completes NMR to promote midline orientation and coordination as well as single limb stability including: - step taps 3x10 BLE (mirror in front and cues to correct to midline when tapping with LLE due to excessive R lateral lean) - forward step x10 BLE min assist with decreased foot clearance on RLE  Pt with questions regarding type of stroke and effects of stroke, therapist educates pt on area of stroke as well as common symptoms of stroke in area, provided with visual diagram during education with pt verbalizing understanding.  Pt ambulates back to room with RW, CGA improved BLE foot placement no scissoring noted. Pt returns to sitting in recliner and remains seated with all needs within reach, call light in place, and chair alarm donned and activated at end of session.  Therapy Documentation Precautions:  Precautions Precautions: Fall Precaution Comments: R hemi, list right. Restrictions Weight Bearing Restrictions: No    Therapy/Group: Individual Therapy  Edwin Cap PT, DPT 07/15/2023, 3:34 PM

## 2023-07-15 NOTE — Progress Notes (Signed)
Inpatient Rehabilitation Center Individual Statement of Services  Patient Name:  Jeremy Lopez  Date:  07/15/2023  Welcome to the Inpatient Rehabilitation Center.  Our goal is to provide you with an individualized program based on your diagnosis and situation, designed to meet your specific needs.  With this comprehensive rehabilitation program, you will be expected to participate in at least 3 hours of rehabilitation therapies Monday-Friday, with modified therapy programming on the weekends.  Your rehabilitation program will include the following services:  Physical Therapy (PT), Occupational Therapy (OT), 24 hour per day rehabilitation nursing, Therapeutic Recreaction (TR), Care Coordinator, Rehabilitation Medicine, Nutrition Services, and Pharmacy Services  Weekly team conferences will be held on Wednesday to discuss your progress.  Your Inpatient Rehabilitation Care Coordinator will talk with you frequently to get your input and to update you on team discussions.  Team conferences with you and your family in attendance may also be held.  Expected length of stay: 7-10 days  Overall anticipated outcome: Independent with device  Depending on your progress and recovery, your program may change. Your Inpatient Rehabilitation Care Coordinator will coordinate services and will keep you informed of any changes. Your Inpatient Rehabilitation Care Coordinator's name and contact numbers are listed  below.  The following services may also be recommended but are not provided by the Inpatient Rehabilitation Center:  Driving Evaluations Home Health Rehabiltiation Services Outpatient Rehabilitation Services    Arrangements will be made to provide these services after discharge if needed.  Arrangements include referral to agencies that provide these services.  Your insurance has been verified to be:  [employer] Your primary doctor is:  Christel Mormon  Pertinent information will be shared with  your doctor and your insurance company.  Inpatient Rehabilitation Care Coordinator:  Dossie Der, [place] [phone] or Luna Glasgow  Information discussed with and copy given to patient by: Lucy Chris, 07/15/2023, 10:26 AM

## 2023-07-16 ENCOUNTER — Encounter (HOSPITAL_COMMUNITY): Payer: HMO

## 2023-07-16 DIAGNOSIS — I639 Cerebral infarction, unspecified: Secondary | ICD-10-CM | POA: Diagnosis not present

## 2023-07-16 MED ORDER — MAGNESIUM GLUCONATE 500 MG PO TABS
250.0000 mg | ORAL_TABLET | Freq: Every day | ORAL | Status: DC
  Administered 2023-07-16 – 2023-07-18 (×3): 250 mg via ORAL
  Filled 2023-07-16 (×3): qty 1

## 2023-07-16 MED ORDER — VITAMIN D 25 MCG (1000 UNIT) PO TABS
2000.0000 [IU] | ORAL_TABLET | Freq: Every day | ORAL | Status: DC
  Administered 2023-07-17 – 2023-07-18 (×2): 2000 [IU] via ORAL
  Filled 2023-07-16 (×2): qty 2

## 2023-07-16 NOTE — Progress Notes (Signed)
Occupational Therapy Session Note  Patient Details  Name: Jeremy Lopez MRN: [id number] Date of Birth: February 01, 1951  Today's Date: 07/16/2023 OT Individual Time: 1130-1210 OT Individual Time Calculation (min): 40 min    Short Term Goals: Week 1:  OT Short Term Goal 1 (Week 1): The pt will improve UB stength to 4+/5 MMT for increase Ind at 95% safe OT Short Term Goal 2 (Week 1): The pt improve activity tolerance to good with occassional rest breaks at 95% safe. OT Short Term Goal 3 (Week 1): The pt will complete LB task in dressing at Ind LOF @ 95% safe OT Short Term Goal 4 (Week 1): The pt will improve static and dynamic balance during functional mobility to good at 95% safe. OT Short Term Goal 5 (Week 1): The pt will complete all functional transfers at ModI and 95% safe for safe transition to home.  Skilled Therapeutic Interventions/Progress Updates:   Pt seen for skilled OT this am t address standing balance with UE and cog components. Pt agreeable to all activity bedside for time constraints. OT brought in [employer] foam trainer and pipe tree activity. Pt was able to stand for 2 trials of mid level complexity small pipe tree with B unilateral support for stepping up then fade to unilateral UE support with occasional no UE support but stronger R lean as fatigue and task time progressed. Overall pt needed CGA/min a for dynamic balance and no cues or support for cog/perceptual nor UE integration and coordination. Left pt recliner level with all needs, safety measures in place at end of session.   Pain: denies any pain  Therapy Documentation Precautions:  Precautions Precautions: Fall Precaution Comments: R hemi, list right. Restrictions Weight Bearing Restrictions: No  Therapy/Group: Individual Therapy  Vicenta Dunning 07/16/2023, 7:45 AM

## 2023-07-16 NOTE — Progress Notes (Signed)
Physical Therapy Session Note  Patient Details  Name: Jeremy Lopez MRN: [id number] Date of Birth: 02/20/1951  Today's Date: 07/16/2023 PT Individual Time: 0833-0928 PT Individual Time Calculation (min): 55 min   Short Term Goals: Week 1:  PT Short Term Goal 1 (Week 1): STG=LTG 2/2 ELOS.  Skilled Therapeutic Interventions/Progress Updates:    Pt presents in room in recliner, agreeable to PT, denies pain at this time but reporting having a cold and bronchitis. Session focused on gait training and NMR  Pt ambulates from room to main gym with RW CGA demonstrates increased R lateral lean with fatigue but no LOB. Pt ambulates 217' without device CGA to min assist with fatigue with x1 instance R LOB pt demonstrate cross over stepping strategy to regain balance. Pt then completes side stepping bilaterally 3x10' with mirror in front for visual cueing. Pt completes agility ladder forward 2x4 trials with verbal/tactile cues for weightshifting onto LLE for RLE swing phase. Pt completes multidirectional gait training with agility ladder with pt completes forward step, and bilateral lateral stepping into and out of squares x1 trial. Pt then completes gait training suitcase carry 7# dumbbell 2x50' switch BUEs between trials. Gait 100' with 4# ankle weight on RLE and 7# dumbbell in RUE followed by gait without device 100' improved weightshifting and RLE step length noted.  Pt completes NMR to promote weightshifting bilaterally, dynamic standing balance including: - lateral step taps BLE 2x15 - RLE marches with 4# ankle weight x10 - RLE marches with 4# ankle weight and holding 7# dumbbell in RUE x10 - lateral side bends x10 7# bilaterally  Pt returns to room ambulating with RW CGA cues for LLE weightshifting, pt returns to sitting and remains seated with all needs within reach, call light in place, and chair alarm donned and activated at end of session.   Therapy Documentation Precautions:   Precautions Precautions: Fall Precaution Comments: R hemi, list right. Restrictions Weight Bearing Restrictions: No    Therapy/Group: Individual Therapy  Edwin Cap 07/16/2023, 9:34 AM

## 2023-07-16 NOTE — Progress Notes (Signed)
PROGRESS NOTE   Subjective/Complaints: No new complaints this morning Working in therapy gym SBP improved but still elevated, will add magnesium at night   ROS: Patient denies fever, rash, sore throat, blurred vision, dizziness, nausea, vomiting, diarrhea,   shortness of breath or chest pain, joint or back/neck pain, headache, or mood change.    Objective:   No results found. Recent Labs    07/15/23 0556  WBC 6.5  HGB 13.2  HCT 39.6  PLT 356   Recent Labs    07/15/23 0556  NA 133*  K 3.6  CL 99  CO2 25  GLUCOSE 78  BUN 20  CREATININE 1.19  CALCIUM 8.3*    Intake/Output Summary (Last 24 hours) at 07/16/2023 0957 Last data filed at 07/16/2023 0700 Gross per 24 hour  Intake 456 ml  Output 925 ml  Net -469 ml        Physical Exam: Vital Signs Blood pressure (!) 145/85, pulse 74, temperature 98.4 F (36.9 C), temperature source Oral, resp. rate 16, height 5\' 10"  (1.778 m), weight 92.7 kg, SpO2 96%.  Gen: no distress, normal appearing HEENT: oral mucosa pink and moist, NCAT Cardio: Reg rate Chest: normal effort, normal rate of breathing Abd: soft, non-distended Ext: no edema Psych: pleasant, normal affect Neuro:  Alert and oriented x 3. Normal insight and awareness. Intact Memory. Normal language and speech. Cranial nerve exam unremarkable. MMT: 5/5 LUE and LLE. 4+/5 RUE and RLE. Mild right limb ataxia. Sensory exam normal for light touch and pain in all 4 limbs.  No abnormal tone appreciated.  .   Musculoskeletal: Full ROM, No pain with AROM or PROM in the neck, trunk, or extremities. Posture appropriate     Assessment/Plan: 1. Functional deficits which require 3+ hours per day of interdisciplinary therapy in a comprehensive inpatient rehab setting. Physiatrist is providing close team supervision and 24 hour management of active medical problems listed below. Physiatrist and rehab team continue to  assess barriers to discharge/monitor patient progress toward functional and medical goals  Care Tool:  Bathing    Body parts bathed by patient: Right arm, Left arm, Chest, Abdomen, Front perineal area, Buttocks, Right upper leg, Left upper leg, Right lower leg, Left lower leg, Face         Bathing assist Assist Level: Supervision/Verbal cueing     Upper Body Dressing/Undressing Upper body dressing   What is the patient wearing?: Pull over shirt    Upper body assist Assist Level: Set up assist    Lower Body Dressing/Undressing Lower body dressing      What is the patient wearing?: Pants (Shorts)     Lower body assist Assist for lower body dressing: Minimal Assistance - Patient > 75%     Toileting Toileting    Toileting assist Assist for toileting: Minimal Assistance - Patient > 75%     Transfers Chair/bed transfer  Transfers assist     Chair/bed transfer assist level: Contact Guard/Touching assist (CGA to MinA)     Locomotion Ambulation   Ambulation assist      Assist level: Moderate Assistance - Patient 50 - 74% Assistive device: No Device Max distance: 90  Walk 10 feet activity   Assist     Assist level: Moderate Assistance - Patient - 50 - 74% Assistive device: No Device   Walk 50 feet activity   Assist    Assist level: Moderate Assistance - Patient - 50 - 74% Assistive device: No Device    Walk 150 feet activity   Assist Walk 150 feet activity did not occur: Safety/medical concerns    Assistive device: Walker-rolling    Walk 10 feet on uneven surface  activity   Assist     Assist level: Minimal Assistance - Patient > 75% Assistive device: Other (comment) (no device.)   Wheelchair     Assist Is the patient using a wheelchair?: Yes Type of Wheelchair: Manual (during dressing)    Wheelchair assist level: Supervision/Verbal cueing Max wheelchair distance: 50    Wheelchair 50 feet with 2 turns  activity    Assist        Assist Level: Supervision/Verbal cueing   Wheelchair 150 feet activity     Assist      Assist Level: Maximal Assistance - Patient 25 - 49%   Blood pressure (!) 145/85, pulse 74, temperature 98.4 F (36.9 C), temperature source Oral, resp. rate 16, height 5\' 10"  (1.778 m), weight 92.7 kg, SpO2 96%.  Medical Problem List and Plan: 1. Functional deficits secondary to right medial inferior cerebellar infarct.             -hx of gait disorder, ?NPH with shuffling, forward leaning gait but was distinctly different than what he's experienced after this stroke.             -patient may shower             -ELOS/Goals: 10-12 days, mod I goals with PT and OT   -Patient is beginning CIR therapies today including PT and OT   Metanx started 2.  Antithrombotics: -DVT/anticoagulation:  Pharmaceutical: Lovenox             -antiplatelet therapy: Aspirin and Plavix for 3 weeks then plavix alone given pt's hx of CAD (cards/neuro on board)   3. Pain Management: Tylenol as needed             -continue gabapentin 300 mg q HS   4. Mood/Behavior/Sleep: LCSW to evaluate and provide emotional support             -continue escitalopram 10 mg daily             -continue home trazodone 50 mg q HS--sleeping well             -antipsychotic agents: n/a              5. Neuropsych/cognition: This patient is capable of making decisions on his own behalf.   6. Skin/Wound Care: Routine skin care checks   7. Fluids/Electrolytes/Nutrition: Routine Is and Os and follow-up chemistries   8: Hypertension: monitor TID and prn             -continue metoprolol succinate 100 mg daily             -continue diltiazem 240 mg daily  -continue magnesium gluconate 250mg  HS   9: Hyperlipidemia: continue statin   10: History of CAD; on aspirin and statin  -continue plavix as well             -follow-up with Dr. Dietrich Pates   11: History lung cancer 2008   12: Asthmatic  bronchitis/former smoker, slight exacerbation (at  home uses Trelegy/Ventolin)             -continue Breo/Incruse inhalers             -continue mucinex dm which seems to have helped             -prn duoneb, hycodan             -follow-up with Dr. Tonia Brooms   12: Gait disturbance: recent work-up as outpatient by Dr. Marjory Lies             -to re-schedule LP for findings of ventriculomegaly/NPH   -plavix now long term, so this will need to be considered in scheduling procedure.  14: Chronic HFpEF: check daily weights, messaged nursing to please weigh patient today    Filed Weights   07/13/23 1600 07/14/23 0553 07/15/23 1800  Weight: 95.7 kg 92.6 kg 92.7 kg    15. Hyponatremia: continue to monitor sodium  16. Suboptimal vitamin D: increase D3 to 2,000U daily  LOS: 3 days A FACE TO FACE EVALUATION WAS PERFORMED  Drema Pry Sharyah Bostwick 07/16/2023, 9:57 AM

## 2023-07-16 NOTE — Plan of Care (Signed)
  Problem: Consults Goal: RH STROKE PATIENT EDUCATION Description: See Patient Education module for education specifics  Outcome: Progressing   Problem: RH BOWEL ELIMINATION Goal: RH STG MANAGE BOWEL WITH ASSISTANCE Description: STG Manage Bowel with toileting Assistance. Outcome: Progressing Goal: RH STG MANAGE BOWEL W/MEDICATION W/ASSISTANCE Description: STG Manage Bowel with Medication with mod I Assistance. Outcome: Progressing   Problem: RH SAFETY Goal: RH STG ADHERE TO SAFETY PRECAUTIONS W/ASSISTANCE/DEVICE Description: STG Adhere to Safety Precautions With cues Assistance/Device. Outcome: Progressing   Problem: RH COGNITION-NURSING Goal: RH STG USES MEMORY AIDS/STRATEGIES W/ASSIST TO PROBLEM SOLVE Description: STG Uses Memory Aids/Strategies With cues Assistance to Problem Solve. Outcome: Progressing   Problem: RH KNOWLEDGE DEFICIT Goal: RH STG INCREASE KNOWLEDGE OF HYPERTENSION Description: Patient and spouse will be able to manage HTN with medications and dietary modification using educational resources independently Outcome: Progressing Goal: RH STG INCREASE KNOWLEGDE OF HYPERLIPIDEMIA Description: Patient and spouse will be able to manage HLD with medications and dietary modification using educational resources independently Outcome: Progressing Goal: RH STG INCREASE KNOWLEDGE OF STROKE PROPHYLAXIS Description: Patient and spouse will be able to manage secondary risks with medications and dietary modification using educational resources independently Outcome: Progressing   Problem: Education: Goal: Knowledge of disease or condition will improve Outcome: Progressing Goal: Knowledge of secondary prevention will improve (MUST DOCUMENT ALL) Outcome: Progressing Goal: Knowledge of patient specific risk factors will improve Loraine Leriche N/A or DELETE if not current risk factor) Outcome: Progressing   Problem: Ischemic Stroke/TIA Tissue Perfusion: Goal: Complications of  ischemic stroke/TIA will be minimized Outcome: Progressing   Problem: Coping: Goal: Will verbalize positive feelings about self Outcome: Progressing Goal: Will identify appropriate support needs Outcome: Progressing   Problem: Health Behavior/Discharge Planning: Goal: Ability to manage health-related needs will improve Outcome: Progressing Goal: Goals will be collaboratively established with patient/family Outcome: Progressing   Problem: Self-Care: Goal: Ability to participate in self-care as condition permits will improve Outcome: Progressing Goal: Verbalization of feelings and concerns over difficulty with self-care will improve Outcome: Progressing Goal: Ability to communicate needs accurately will improve Outcome: Progressing   Problem: Nutrition: Goal: Risk of aspiration will decrease Outcome: Progressing Goal: Dietary intake will improve Outcome: Progressing   Problem: Nutrition: Goal: Risk of aspiration will decrease Outcome: Progressing   Problem: Nutrition: Goal: Dietary intake will improve Outcome: Progressing

## 2023-07-16 NOTE — Progress Notes (Signed)
After being toileted, patient transferred to recliner chair. Chair alarm turned on.    Tilden Dome, LPN

## 2023-07-16 NOTE — Progress Notes (Signed)
Occupational Therapy Session Note  Patient Details  Name: Jeremy Lopez MRN: [id number] Date of Birth: [birth year]  Today's Date: 07/16/2023 OT Individual Time: 1400-1500 OT Individual Time Calculation (min): 60 min   Short Term Goals: Week 1:  OT Short Term Goal 1 (Week 1): The pt will improve UB stength to 4+/5 MMT for increase Ind at 95% safe OT Short Term Goal 2 (Week 1): The pt improve activity tolerance to good with occassional rest breaks at 95% safe. OT Short Term Goal 3 (Week 1): The pt will complete LB task in dressing at Ind LOF @ 95% safe OT Short Term Goal 4 (Week 1): The pt will improve static and dynamic balance during functional mobility to good at 95% safe. OT Short Term Goal 5 (Week 1): The pt will complete all functional transfers at ModI and 95% safe for safe transition to home.  Skilled Therapeutic Interventions/Progress Updates:   Pt greeted seated in recliner and agreeable to OT treatment session. Pt ambulated to therapy gym w/ RW and CGA. UB and LB there-ex using NuStep on level 6 for 11 minutes. UB there-ex using 4 lb single weights 3 sets of 10 curl to press in standing. Standing alternating toe taps on medium cones without RW. Min to mod A to maintain balance when weight shifting. Functional ambulation in hallway RW and focus on R LE clearance while taking steps. Pt returned to room and left seated in recliner with alarm on, call bell in reach, and needs met.   Therapy Documentation Precautions:  Precautions Precautions: Fall Precaution Comments: R hemi, list right. Restrictions Weight Bearing Restrictions: No Pain: Pain Assessment Pain Scale: 0-10 Pain Score: 1  Rest and repositioned   Therapy/Group: Individual Therapy  Mal Amabile 07/16/2023, 2:16 PM

## 2023-07-16 NOTE — Progress Notes (Signed)
Physical Therapy Session Note  Patient Details  Name: Jeremy Lopez MRN: [id number] Date of Birth: [birth year]  Today's Date: 07/16/2023 PT Individual Time: 4098-1191 PT Individual Time Calculation (min): 43 min   Short Term Goals: Week 1:  PT Short Term Goal 1 (Week 1): STG=LTG 2/2 ELOS.  Skilled Therapeutic Interventions/Progress Updates: Patient sitting in recliner on entrance to room. Patient alert and agreeable to PT session.   Patient reported no pain, only feeling congested (nsg provided medication prior to therapy.  Therapeutic Activity: Transfers: Pt performed sit<>stand transfers throughout session with CGA for safety.  Gait Training:  Pt ambulated room<>main gym using RW with CGA/minA. Pt demonstrated the following gait deviations with therapist providing the described cuing and facilitation for improvement:  - decreased step clearance bilaterally - decreased stance time on L LE with VC to increase step length on R LE - pt had one instance on slight LOB to the R but was able to self correct with light minA for safety - Pt also cued to find vertical lines while ambulating in hallway in order to orient to upright position from something static.  Neuromuscular Re-ed: NMR facilitated during session with focus on dynamic balance in standing and stepping strategies. - Toe taps to purple and red cones. Pt with min/heavy minA throughout per presentation of laterally leaning to the R due to decreased WB status on L (PTA on L side) - Step to 3" step (taps) with 4lb ankle weight on R LE in order to increase stance time on L LE (min/modA per R lean with PTA providing some slack for pt to self correct - PTA on L side). On 2nd round, PTA on R side with demonstrative cues for pt to laterally step to the R when LOB occurs for stepping strategies (pt still with 4lb ankle weight). PTA with close supervision/CGA for safety with pt performing stepping strategies appropriately and required less  assistance to maintain standing balance. Pt then with 4lb ankle weight off with same 3" step and with close supervision with no LOB to the R. Pt then back to stepping to cone with noted decrease in required assistance vs when first performing earlier (light minA/CGA). Pt reported feeling more confident and in balance at that time.   NMR performed for improvements in motor control and coordination, balance, sequencing, judgement, and self confidence/ efficacy in performing all aspects of mobility at highest level of independence.   Patient sitting in recliner at end of session with brakes locked, belt alarm set, and all needs within reach.      Therapy Documentation Precautions:  Precautions Precautions: Fall Precaution Comments: R hemi, list right. Restrictions Weight Bearing Restrictions: No   Therapy/Group: Individual Therapy  Charliegh Vasudevan PTA 07/16/2023, 3:33 PM

## 2023-07-16 NOTE — IPOC Note (Signed)
Overall Plan of Care [facility]) Patient Details Name: Jeremy Lopez MRN: [id number] DOB: 1951-01-19  Admitting Diagnosis: Acute CVA (cerebrovascular accident) [facility])  Hospital Problems: Principal Problem:   Acute CVA (cerebrovascular accident) [facility])     Functional Problem List: Nursing Bowel, Endurance, Safety, Medication Management  PT Balance, Safety, Endurance, Motor  OT Balance, Endurance, Other (Comment) (UB strength)  SLP    TR         Basic ADL's: OT       Advanced  ADL's: OT Simple Meal Preparation     Transfers: PT Bed Mobility, Bed to Chair, Car, Furniture  OT       Locomotion: PT Ambulation, Psychologist, prison and probation services, Stairs     Additional Impairments: OT Other (comment) (LB dressing)  SLP        TR      Anticipated Outcomes Item Anticipated Outcome  Self Feeding ModI  Swallowing      Basic self-care  Mod I  Toileting  ModI   Bathroom Transfers ModI  Bowel/Bladder  manage bowel w mod I assist  Transfers  Mod I  Locomotion  Mod I w/ LRAD  Communication     Cognition     Pain  n/a  Safety/Judgment      Therapy Plan: PT Intensity: Minimum of 1-2 x/day ,45 to 90 minutes PT Frequency: 5 out of 7 days PT Duration Estimated Length of Stay: 7-10 days OT Frequency: Total of 15 hours over 7 days of combined therapies, 5 out of 7 days OT Duration/Estimated Length of Stay: 7-10 days OT Intensity: Minimum of 1-2 x/day, 45 to 90 min     Team Interventions: Nursing Interventions Patient/Family Education, Medication Management, Bowel Management, Discharge Planning, Disease Management/Prevention, Cognitive Remediation/Compensation  PT interventions Ambulation/gait training, Community reintegration, Neuromuscular re-education, Stair training, UE/LE Strength taining/ROM, Wheelchair propulsion/positioning, Therapeutic Activities, UE/LE Coordination activities, Discharge planning, Warden/ranger, Functional mobility training, Patient/family  education, Therapeutic Exercise  OT Interventions Balance/vestibular training, Therapeutic Exercise, Neuromuscular re-education, UE/LE Coordination activities, UE/LE Strength taining/ROM, Therapeutic Activities  SLP Interventions    TR Interventions    SW/CM Interventions Discharge Planning, Psychosocial Support, Patient/Family Education   Barriers to Discharge MD  Medical stability  Nursing Decreased caregiver support 1 level/level entry w spouse  PT Home environment access/layout    OT  N/a    SLP      SW       Team Discharge Planning: Destination: PT-Home ,OT- Home , SLP-  Projected Follow-up: PT-Outpatient PT, OT- TBD None, SLP-  Projected Equipment Needs: PT-To be determined, OT- Rolling walker with 5" wheels, 3 in 1 bedside comode, SLP-  Equipment Details: PT-Pt states equipment at home from neighbor, RW, shower chair, grab bars., OT-  Patient/family involved in discharge planning: PT- Patient, Family member/caregiver,  OT-Patient, SLP-   MD ELOS: 7-10 days Medical Rehab Prognosis:  Excellent Assessment: The patient has been admitted for CIR therapies with the diagnosis of CVA. The team will be addressing functional mobility, strength, stamina, balance, safety, adaptive techniques and equipment, self-care, bowel and bladder mgt, patient and caregiver education. Goals have been set at modI. Anticipated discharge destination is home.        See Team Conference Notes for weekly updates to the plan of care

## 2023-07-16 NOTE — Progress Notes (Addendum)
Nurse tech provided shower today. See flowsheet.    Tilden Dome, LPN

## 2023-07-17 DIAGNOSIS — I639 Cerebral infarction, unspecified: Secondary | ICD-10-CM | POA: Diagnosis not present

## 2023-07-17 NOTE — Progress Notes (Signed)
Occupational Therapy Session Note  Patient Details  Name: Jeremy Lopez MRN: [id number] Date of Birth: 15-Dec-1950  Today's Date: 07/17/2023 OT Individual Time: 0832-       Short Term Goals: Week 1:  OT Short Term Goal 1 (Week 1): The pt will improve UB stength to 4+/5 MMT for increase Ind at 95% safe OT Short Term Goal 2 (Week 1): The pt improve activity tolerance to good with occassional rest breaks at 95% safe. OT Short Term Goal 3 (Week 1): The pt will complete LB task in dressing at Ind LOF @ 95% safe OT Short Term Goal 4 (Week 1): The pt will improve static and dynamic balance during functional mobility to good at 95% safe. OT Short Term Goal 5 (Week 1): The pt will complete all functional transfers at ModI and 95% safe for safe transition to home.  Skilled Therapeutic Interventions/Progress Updates:    Patient received seated in recliner.  Agreeable for shower.  Patient able to stand and walk to shower without device, with close supervision to contact guard.  Patient with loss of balance after vision occluded during dynamic balance activity - removing shirt.  Patient able to catch himself with loss of balance.  Patient able to bathe and dress self at set up/supervision level.  Stood at sink to complete grooming.    Walked to gym with cueing to maintain gaze forward versus downward.  Patient with unequal steps, and narrow base of support.  Worked on maintaining midline while stepping. Worked on alignment with use of visual feedback for sit to stand and stand to sit.  Patient can recognize loss of balance - but well after strong weight shift to right.  Worked on toe tapping, dynamic single leg stance.  Patient with proximal weakness and difficulty maintaining center of balance when left leg out of support.    Patient walked back to room with contact guard to supervision.  Patient left up in recliner with chair pad alarm engaged and call bell/ personal items in reach.    Therapy  Documentation Precautions:  Precautions Precautions: Fall Precaution Comments: R hemi, list right. Restrictions Weight Bearing Restrictions: No  Pain Assessment Pain Scale: 0-10 Pain Score: 0-No pain    Therapy/Group: Individual Therapy  [name] 07/17/2023, 8:48 AM

## 2023-07-17 NOTE — Progress Notes (Signed)
Physical Therapy Session Note  Patient Details  Name: Jeremy Lopez MRN: [id number] Date of Birth: 1951-09-29  Today's Date: 07/17/2023 PT Individual Time: 1300-1344 PT Individual Time Calculation (min): 44 min   Short Term Goals: Week 1:  PT Short Term Goal 1 (Week 1): STG=LTG 2/2 ELOS.  Skilled Therapeutic Interventions/Progress Updates:      Pt sitting in recliner to start - no reports of pain. 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   Sit<>stand to [employer] with CGA - ambulates with close supervision to CGA from his room to main rehab gym with RW, ~130ft. Cues for upright posture, forward gaze, but no knee buckling or LOB - slight trunk lean to the R.   Dynamic gait training with CGA/minA and no AD - ball toss/catch and ball bounce/catch to 2nd person while ambulating to challenge stability and gait - fair hand/eye coordination, mild lateral instability with turns or distractions, but overall did a good job of keeping balance.   NMR standing on blue air-ex foam pad with feet together, instructed in ball toss/catch with minA for balance - persistent R trunk lean with delayed righting and insufficient stepping strategies.   Completed x5 minutes of standing UE ergometer at L5 resistance with supervision for standing balance. Ambulated back to his room with CGA and RW and concluded session seated in recliner with chair pad alarm on, call bell in reach.    Therapy Documentation Precautions:  Precautions Precautions: Fall Precaution Comments: R hemi, list right. Restrictions Weight Bearing Restrictions: No General:      Therapy/Group: Individual Therapy  Jerimiah Wolman P Prabhav Faulkenberry PT 07/17/2023, 1:44 PM

## 2023-07-17 NOTE — Progress Notes (Signed)
Occupational Therapy Session Note  Patient Details  Name: Jeremy Lopez MRN: [id number] Date of Birth: Jul 29, 1951  Today's Date: 07/17/2023 OT Individual Time: 1355-1443 OT Individual Time Calculation (min): 48 min    Short Term Goals: Week 1:  OT Short Term Goal 1 (Week 1): The pt will improve UB stength to 4+/5 MMT for increase Ind at 95% safe OT Short Term Goal 2 (Week 1): The pt improve activity tolerance to good with occassional rest breaks at 95% safe. OT Short Term Goal 3 (Week 1): The pt will complete LB task in dressing at Ind LOF @ 95% safe OT Short Term Goal 4 (Week 1): The pt will improve static and dynamic balance during functional mobility to good at 95% safe. OT Short Term Goal 5 (Week 1): The pt will complete all functional transfers at ModI and 95% safe for safe transition to home.  Skilled Therapeutic Interventions/Progress Updates:    Patient received seated in recliner, agreeable to OT session.  Declined need for bathroom.  Walked to gym without device - needing facilitation for upright posture, weight shift, gaze forward and step length.  Patient with loss of balance and scissor stepping when he encountered an unexpected obstacle.  Worked in parallel bars to break down weight shift and challenge base of support - working in stride position shifting weight to front foot without rotating hips.  Patient having difficulty motor planning this movement even after demonstration, so hands on facilitation to guide straight plane forward diagonal weight shift to front foot, then back foot.  Once patient confident and steady with this movement actually worked on forward progression of either foot.  Worked on hip strategies in standing to hit target.   Walked back to room and assisted patient to bed.  Very fatigued, feeling "clammy" - attributing to his cold.  Direct hand off to NT obtaining vitals.    Therapy Documentation Precautions:  Precautions Precautions: Fall Precaution  Comments: R hemi, list right. Restrictions Weight Bearing Restrictions: No General: General OT Amount of Missed Time: 12 Minutes  Pain:  Denies pain     Therapy/Group: Individual Therapy  [name] 07/17/2023, 2:44 PM

## 2023-07-17 NOTE — Patient Care Conference (Signed)
Inpatient RehabilitationTeam Conference and Plan of Care Update Date: 07/17/2023   Time: 11:16 AM    Patient Name: Jeremy Lopez      Medical Record Number: [id number]  Date of Birth: 03-14-1951 Sex: Male         Room/Bed: 4M01C/4M01C-01 Payor Info: Payor: [employer] / Plan: [employer] HMO / Product Type: *No Product type* /    Admit Date/Time:  07/13/2023  4:02 PM  Primary Diagnosis:  Acute CVA (cerebrovascular accident) [facility])  Hospital Problems: Principal Problem:   Acute CVA (cerebrovascular accident) [facility])    Expected Discharge Date: Expected Discharge Date: 07/20/23  Team Members Present: Physician leading conference: Dr. Sula Soda Social Worker Present: Dossie Der, LCSW Nurse Present: Chana Bode, RN PT Present: Wynelle Link, PT OT Present: Bretta Bang, OT SLP Present: Jeannie Done, SLP PPS Coordinator present : Fae Pippin, SLP     Current Status/Progress Goal Weekly Team Focus  Bowel/Bladder   Pt continent of b/b.   Remain continent   Assist with tolieting qshift and prn    Swallow/Nutrition/ Hydration               ADL's   Mod/min assist BADL   Mod I   Balance with functional mobility, activity tolerance, increase independence with ADL    Mobility   CGA transfers and ambulation, minA dynamic standing balance without UE support   mod I  Dynamic gait training, NMR, DC planning    Communication                Safety/Cognition/ Behavioral Observations               Pain   pt c/o R ear pain, 3-5/10 pain score. prn oxy given   <3 pain score   Assess qshift and prn    Skin   Skin intact   Maintain skin intergrity  Assess qshift and prn      Discharge Planning:  HOme with wife who is in good health and can assist, pt is doing well. Await team's recommendations for equipment and follow up   Team Discussion: Patient with neuropathy post CVA. High fall risk with right lean and fatigue complicated by  premorbid dizziness due to hydrocephalus.   Patient on target to meet rehab goals: yes, currently needs supervision - min assist for ADLs. Able to ambulate up to 200' without an assistive device with CGA - min assist. Goals for discharge set for mod I overall.  *See Care Plan and progress notes for long and short-term goals.   Revisions to Treatment Plan:  Ankle weight trial Agility ladder trial Qutenza OP; Neurontin in CIR for neuropathy   Teaching Needs: Safety, fall prevention tips, medications, transfers, toileting, etc.   Current Barriers to Discharge: Home enviroment access/layout  Possible Resolutions to Barriers: Family education DME: rollator     Medical Summary Current Status: neuropathy, impaired balance, hydrocephalus  Barriers to Discharge: Medical stability  Barriers to Discharge Comments: neuropathy, impaired balance, hydrocpehalus Possible Resolutions to [employer] Focus: scheduled outpatient Qutenza and discussed we can hopefully get him off gabapentin if this is helpful, discussed outpatient lumbar puncture   Continued Need for Acute Rehabilitation Level of Care: The patient requires daily medical management by a physician with specialized training in physical medicine and rehabilitation for the following reasons: Direction of a multidisciplinary physical rehabilitation program to maximize functional independence : Yes Medical management of patient stability for increased activity during participation in an intensive rehabilitation regime.: Yes Analysis  of laboratory values and/or radiology reports with any subsequent need for medication adjustment and/or medical intervention. : Yes   I attest that I was present, lead the team conference, and concur with the assessment and plan of the team.   Chana Bode B 07/17/2023, 8:57 PM

## 2023-07-17 NOTE — Progress Notes (Signed)
Physical Therapy Session Note  Patient Details  Name: Jeremy Lopez MRN: [id number] Date of Birth: 1951-08-07  Today's Date: 07/17/2023 PT Individual Time: 1131-1200 PT Individual Time Calculation (min): 29 min   Short Term Goals: Week 1:  PT Short Term Goal 1 (Week 1): STG=LTG 2/2 ELOS.  Skilled Therapeutic Interventions/Progress Updates: Pt presented in recliner agreeable to therapy. Pt denies pain at rest. Session focused on gait, weight shifting, and balance. Pt ambulated without AD to main gym with light minA and verbal cues for increased R foot clearance. Pt noted not to have excessive R lateral lean. Pt with brief seated rest in gym, participated in [employer] LOS. Pt's first trial used with UE support. On second trial performed without UE support, completed 1:37 with 41% accuracy. On second trial completed 1:14 with 47% accuracy.Pt noted improved ankle strategy. Pt then played x 3 min of the "catch" program with pt demonstrating improving accuracy. Pt was able to complete all these tasks without seated rest break. Once completed pt with seated rest break prior to ambulating back to room. Pt ambulated without AD and demonstrating improving step through with RLE. Pt returned to recliner at end of session and left with seat alarm on, call bell within reach and needs met.      Therapy Documentation Precautions:  Precautions Precautions: Fall Precaution Comments: R hemi, list right. Restrictions Weight Bearing Restrictions: No General:   Vital Signs: Therapy Vitals Temp: (!) 97.4 F (36.3 C) Pulse Rate: 68 Resp: 16 BP: (!) 170/89 Patient Position (if appropriate): Sitting Oxygen Therapy SpO2: 98 % O2 Device: Room Air   Therapy/Group: Individual Therapy  Joell Buerger 07/17/2023, 4:06 PM

## 2023-07-17 NOTE — Progress Notes (Signed)
PROGRESS NOTE   Subjective/Complaints: No new complaints this morning Discussed improved blood pressure Discussed his desire to be on less medications   ROS: Patient denies fever, rash, sore throat, blurred vision, dizziness, nausea, vomiting, diarrhea,   shortness of breath or chest pain, joint or back/neck pain, headache, or mood change.    Objective:   No results found. Recent Labs    07/15/23 0556  WBC 6.5  HGB 13.2  HCT 39.6  PLT 356   Recent Labs    07/15/23 0556  NA 133*  K 3.6  CL 99  CO2 25  GLUCOSE 78  BUN 20  CREATININE 1.19  CALCIUM 8.3*    Intake/Output Summary (Last 24 hours) at 07/17/2023 1005 Last data filed at 07/16/2023 2146 Gross per 24 hour  Intake 360 ml  Output 925 ml  Net -565 ml        Physical Exam: Vital Signs Blood pressure (!) 146/77, pulse 73, temperature 98.4 F (36.9 C), resp. rate 18, height 5\' 10"  (1.778 m), weight 94.4 kg, SpO2 94%.  Gen: no distress, normal appearing HEENT: oral mucosa pink and moist, NCAT Cardio: Reg rate Chest: normal effort, normal rate of breathing Abd: soft, non-distended Ext: no edema Psych: pleasant, normal affect Skin: intact  Neuro:  Alert and oriented x 3. Normal insight and awareness. Intact Memory. Normal language and speech. Cranial nerve exam unremarkable. MMT: 5/5 LUE and LLE. 4+/5 RUE and RLE. Mild right limb ataxia. Sensory exam normal for light touch and pain in all 4 limbs.  No abnormal tone appreciated.  .   Musculoskeletal: Full ROM, No pain with AROM or PROM in the neck, trunk, or extremities. Posture appropriate     Assessment/Plan: 1. Functional deficits which require 3+ hours per day of interdisciplinary therapy in a comprehensive inpatient rehab setting. Physiatrist is providing close team supervision and 24 hour management of active medical problems listed below. Physiatrist and rehab team continue to assess barriers  to discharge/monitor patient progress toward functional and medical goals  Care Tool:  Bathing    Body parts bathed by patient: Right arm, Left arm, Chest, Abdomen, Front perineal area, Buttocks, Right upper leg, Left upper leg, Right lower leg, Left lower leg, Face         Bathing assist Assist Level: Supervision/Verbal cueing     Upper Body Dressing/Undressing Upper body dressing   What is the patient wearing?: Pull over shirt    Upper body assist Assist Level: Set up assist    Lower Body Dressing/Undressing Lower body dressing      What is the patient wearing?: Pants (Shorts)     Lower body assist Assist for lower body dressing: Minimal Assistance - Patient > 75%     Toileting Toileting    Toileting assist Assist for toileting: Minimal Assistance - Patient > 75%     Transfers Chair/bed transfer  Transfers assist     Chair/bed transfer assist level: Contact Guard/Touching assist (CGA to MinA)     Locomotion Ambulation   Ambulation assist      Assist level: Moderate Assistance - Patient 50 - 74% Assistive device: No Device Max distance: 90   Walk  10 feet activity   Assist     Assist level: Moderate Assistance - Patient - 50 - 74% Assistive device: No Device   Walk 50 feet activity   Assist    Assist level: Moderate Assistance - Patient - 50 - 74% Assistive device: No Device    Walk 150 feet activity   Assist Walk 150 feet activity did not occur: Safety/medical concerns    Assistive device: Walker-rolling    Walk 10 feet on uneven surface  activity   Assist     Assist level: Minimal Assistance - Patient > 75% Assistive device: Other (comment) (no device.)   Wheelchair     Assist Is the patient using a wheelchair?: Yes Type of Wheelchair: Manual (during dressing)    Wheelchair assist level: Supervision/Verbal cueing Max wheelchair distance: 50    Wheelchair 50 feet with 2 turns activity    Assist         Assist Level: Supervision/Verbal cueing   Wheelchair 150 feet activity     Assist      Assist Level: Maximal Assistance - Patient 25 - 49%   Blood pressure (!) 146/77, pulse 73, temperature 98.4 F (36.9 C), resp. rate 18, height 5\' 10"  (1.778 m), weight 94.4 kg, SpO2 94%.  Medical Problem List and Plan: 1. Functional deficits secondary to right medial inferior cerebellar infarct.             -hx of gait disorder, ?NPH with shuffling, forward leaning gait but was distinctly different than what he's experienced after this stroke.             -patient may shower             -ELOS/Goals: 10-12 days, mod I goals with PT and OT   -Patient is beginning CIR therapies today including PT and OT   Metanx started 2.  Antithrombotics: -DVT/anticoagulation:  Pharmaceutical: Lovenox             -antiplatelet therapy: Aspirin and Plavix for 3 weeks then plavix alone given pt's hx of CAD (cards/neuro on board)   3. Neuropathy: Tylenol as needed             -continue gabapentin 300 mg q HS  Qutenza patches scheduled outpatient   4. Mood/Behavior/Sleep: LCSW to evaluate and provide emotional support             -continue escitalopram 10 mg daily             -continue home trazodone 50 mg q HS--sleeping well             -antipsychotic agents: n/a              5. Neuropsych/cognition: This patient is capable of making decisions on his own behalf.   6. Skin/Wound Care: Routine skin care checks   7. Fluids/Electrolytes/Nutrition: Routine Is and Os and follow-up chemistries   8: Hypertension: monitor TID and prn             -continue metoprolol succinate 100 mg daily             -continue diltiazem 240 mg daily  -continue magnesium gluconate 250mg  HS   9: Hyperlipidemia: continue statin   10: History of CAD; on aspirin and statin  -continue plavix as well             -follow-up with Dr. Dietrich Pates   11: History lung cancer 2008   12: Asthmatic bronchitis/former smoker, slight  exacerbation (at  home uses Trelegy/Ventolin)             -continue Breo/Incruse inhalers             -continue mucinex dm which seems to have helped             -prn duoneb, hycodan             -follow-up with Dr. Tonia Brooms   12: Gait disturbance: recent work-up as outpatient by Dr. Marjory Lies             -to re-schedule LP for findings of ventriculomegaly/NPH   -plavix now long term, so this will need to be considered in scheduling procedure.  14: Chronic HFpEF: check daily weights, messaged nursing to please weigh patient today, weight reviewed and was elevated yesterday    Filed Weights   07/14/23 0553 07/15/23 1800 07/16/23 1258  Weight: 92.6 kg 92.7 kg 94.4 kg    15. Hyponatremia: continue to monitor sodium  16. Suboptimal vitamin D: continue D3 to 2,000U daily  LOS: 4 days A FACE TO FACE EVALUATION WAS PERFORMED  Jeremy Lopez P Dazia Lippold 07/17/2023, 10:05 AM

## 2023-07-17 NOTE — Plan of Care (Signed)
  Problem: Nutrition: Goal: Dietary intake will improve Outcome: Progressing   Problem: Nutrition: Goal: Dietary intake will improve Outcome: Progressing   Problem: Nutrition: Goal: Risk of aspiration will decrease Outcome: Progressing   Problem: Self-Care: Goal: Ability to communicate needs accurately will improve Outcome: Progressing   Problem: Self-Care: Goal: Verbalization of feelings and concerns over difficulty with self-care will improve Outcome: Progressing   Problem: Self-Care: Goal: Ability to participate in self-care as condition permits will improve Outcome: Progressing   Problem: Health Behavior/Discharge Planning: Goal: Goals will be collaboratively established with patient/family Outcome: Progressing

## 2023-07-17 NOTE — Progress Notes (Signed)
Patient ID: Jeremy Lopez, male   DOB: 1951/10/07, 72 y.o.   MRN: [id number]  Met with pt and spoke with wife via telephone to discuss team conference with goals of mod/I-supervision level and target discharge date of 10/12. Both pleased with his progress and have scheduled wife to be here tomorrow from 10-12 for family education. Discussed OP at [facility] both were agreeable will wait need for rolling walker versus rollator. Continue to work on discharge for Sat.

## 2023-07-18 ENCOUNTER — Telehealth: Payer: Self-pay

## 2023-07-18 ENCOUNTER — Encounter (HOSPITAL_COMMUNITY): Payer: HMO

## 2023-07-18 ENCOUNTER — Inpatient Hospital Stay (HOSPITAL_COMMUNITY): Payer: HMO

## 2023-07-18 DIAGNOSIS — I639 Cerebral infarction, unspecified: Secondary | ICD-10-CM | POA: Diagnosis not present

## 2023-07-18 DIAGNOSIS — G629 Polyneuropathy, unspecified: Secondary | ICD-10-CM

## 2023-07-18 MED ORDER — QUTENZA (4 PATCH) 8 % EX KIT: 4.0000 | PACK | Freq: Once | CUTANEOUS | 0 refills | Status: AC

## 2023-07-18 MED ORDER — VITAMIN D 25 MCG (1000 UNIT) PO TABS
3000.0000 [IU] | ORAL_TABLET | Freq: Every day | ORAL | Status: DC
  Administered 2023-07-19 – 2023-07-21 (×3): 3000 [IU] via ORAL
  Filled 2023-07-18 (×3): qty 3

## 2023-07-18 MED ORDER — GABAPENTIN 100 MG PO CAPS
200.0000 mg | ORAL_CAPSULE | Freq: Every day | ORAL | Status: DC
  Administered 2023-07-18 – 2023-07-20 (×3): 200 mg via ORAL
  Filled 2023-07-18 (×3): qty 2

## 2023-07-18 NOTE — Progress Notes (Signed)
Physical Therapy Session Note  Patient Details  Name: Jeremy Lopez MRN: [id number] Date of Birth: [birth year]  Today's Date: 07/18/2023 PT Missed Time: 30 Minutes Missed Time Reason: Xray  Skilled Therapeutic Interventions/Progress Updates:   Attempted to see pt for scheduled PT treatment, however pt off unit for x-ray. Will attempt to make up missed time as schedule allows and as pt becomes available. 30 minutes missed of skilled physical therapy.   Therapy Documentation Precautions:  Precautions Precautions: Fall Precaution Comments: R hemi, list right. Restrictions Weight Bearing Restrictions: No  Therapy/Group: Individual Therapy Marlana Salvage Zaunegger Blima Rich PT, DPT 07/18/2023, 7:06 AM

## 2023-07-18 NOTE — Progress Notes (Signed)
Physical Therapy Session Note  Patient Details  Name: Jeremy Lopez MRN: [id number] Date of Birth: August 26, 1951  Today's Date: 07/18/2023 PT Individual Time: 5638-7564 + 1000-1100 PT Individual Time Calculation (min): 42 min  + 60 min  Short Term Goals: Week 1:  PT Short Term Goal 1 (Week 1): STG=LTG 2/2 ELOS.  Skilled Therapeutic Interventions/Progress Updates:      1st session: Pt sitting in recliner to start - nursing at bedside for morning medications. Pt denies any pain. Discussed DME rec's for LRAD to promote safety and prevent falls risk. Pt understanding and receptive to all education.   Gait training during session with rollator vs SPC vs no AD. Rollator and SPC adjusted to fit. Ambulated variable distances in rehab space on level surfaces, >156ft - supervision with rollator and CGA with SPC due to mild R trunk lean. Pt defers SPC due to unsteadiness and defers rollator due to "bulkiness" - he prefers the RW for AD which he owns at home - CSW notified.   Stair training using 1 hand rail and 6" steps, completed x12 steps with CGA and reciprocal stepping for both directions - mild unsteadiness with descent > ascent but no knee buckling observed.  Completed Biodex training without UE support and close supervision for safety - Limits of Stability training to work on weight shifting and understanding limitations - pt scoring 29% with medium difficulty and 14% with higher difficulty. Had issues with weight shifting L and posteriorly without losing his balance.   Pt returned to his room and concluded session seated in recliner with chair pad alarm on, call bell in reach. Aware of family ed/training later this AM.     2nd session: Pt sitting in recliner with family (wife and daughter) present for family education and training. Reviewed home safety, DME rec's, fall prevention, primary deficits related to CVA (R sided weakness and impaired standing balance), etc. All voiced understanding  and appreciative of education. Reasonable questions regarding DC process, follow up therapies, etc - very supportive and wanting the best for J.D.   Pt instructed in functional mobility - sit<>stand to RW with supervision assist. He ambulates with close supervision and RW from his room to ortho rehab gym, ~166ft - mild R trunk lean with RW that is self corrected. Completes car transfer with setupA for RW management and no LOB.   Reviewed stair training in main rehab gym with 1 hand rail to simulate home setup - he navigated up/down x12, 6" steps at CGA level with reciprocal stepping. Reviewed home setup and patient isn't required to go upstairs but prefers to when he gets on his computer - master bed/bath on main level.   Dynamic gait training completed with no AD - tossing/bouncing ball back and forth with his daughter while ambulating - CGA to light minA for stability and balance. Demonstrated family how patient ambulates without AD to better understand his limitations - light minA for balance while ambulating with no AD due to R trunk lean and unsteadiness. Recommending RW at home for all mobility and they are in agreement.   Worked on balance in rehab gym: -feet together eyes open - CGA with LOB every ~20-30 seconds to the R with minA for recovery -Semi tandem eyes open - CGA with LOB every ~10-15 seconds to the R with minA for recovery -semi tandem eyes closed - minA with LOB every ~5-10 seconds to the R with minA for recovery -semi tandem eyes open holding 3lb med ball while  doing overhead press and trunk rotation L<>R with CGA/minA for balance.   Session concluded with patient in room, seated in recliner with chair pad alarm on - made aware of upcoming family ed with OT.         Therapy Documentation Precautions:  Precautions Precautions: Fall Precaution Comments: R hemi, list right. Restrictions Weight Bearing Restrictions: No General:     Therapy/Group: Individual  Therapy  [name] 07/18/2023, 7:43 AM

## 2023-07-18 NOTE — Plan of Care (Signed)
  Problem: Nutrition: Goal: Risk of aspiration will decrease Outcome: Progressing   Problem: Nutrition: Goal: Dietary intake will improve Outcome: Progressing   Problem: Self-Care: Goal: Ability to communicate needs accurately will improve Outcome: Progressing   Problem: Self-Care: Goal: Verbalization of feelings and concerns over difficulty with self-care will improve Outcome: Progressing   Problem: Self-Care: Goal: Ability to participate in self-care as condition permits will improve Outcome: Progressing   Problem: Health Behavior/Discharge Planning: Goal: Goals will be collaboratively established with patient/family Outcome: Progressing   Problem: Health Behavior/Discharge Planning: Goal: Ability to manage health-related needs will improve Outcome: Progressing   Problem: Ischemic Stroke/TIA Tissue Perfusion: Goal: Complications of ischemic stroke/TIA will be minimized Outcome: Progressing

## 2023-07-18 NOTE — Progress Notes (Signed)
Patient ID: Jeremy Lopez, male   DOB: 08-31-1951, 72 y.o.   MRN: [id number]  Met with pt and wife who went through education today. Family requesting another day and discharge 10/13. MD and team in agreement with this plan. Changed discharge date to 10/13.

## 2023-07-18 NOTE — Progress Notes (Signed)
Occupational Therapy Session Note  Patient Details  Name: Jeremy Lopez MRN: [id number] Date of Birth: 1951-08-06  Today's Date: 07/18/2023 OT Individual Time: 4098-1191 and 1415-1456 OT Individual Time Calculation (min): 56 min and 41 min   Short Term Goals: Week 1:  OT Short Term Goal 1 (Week 1): The pt will improve UB stength to 4+/5 MMT for increase Ind at 95% safe OT Short Term Goal 2 (Week 1): The pt improve activity tolerance to good with occassional rest breaks at 95% safe. OT Short Term Goal 3 (Week 1): The pt will complete LB task in dressing at Ind LOF @ 95% safe OT Short Term Goal 4 (Week 1): The pt will improve static and dynamic balance during functional mobility to good at 95% safe. OT Short Term Goal 5 (Week 1): The pt will complete all functional transfers at ModI and 95% safe for safe transition to home.  Skilled Therapeutic Interventions/Progress Updates:   AM Session:   Patient received seated in recliner.  Session focused on family education.  Patient's wife and daughter present, and had already gone through education with Physical Therapy.  Reviewed current status:  level of assistance for self care tasks, and summary of current impairments - balance primarily, and general weakness.  Reviewed recommendations for shower, toilet, and assistance level at discharge.  Family indicates it would be better for discharge Sunday (vs Saturday) to allow out of town support to arrive, and to set up house - ensure patient and wife have adequate support.  Discussed installing grab bars in shower - was talk of this prior to patient's stroke.  Son in Social worker is a [employer], [employer] and can install - discussed placement and orientation.  Family pleased with improvement and eager for patient to return home.   Patient left up in chair with wife at bedside, personal items in reach.    PM Session:  Patient received seated in recliner. Agreeable to OT session to address balance.  Ambulated to gym with  walker and close supervision with cueing for cadence with stepping - spending equal time on each foot.  Worked on functional strengthening stepping up two steps, controlling descent, alternating feet.  Patient reliant on UE support to help maintain his body mass over his feet.   Worked on single leg stance - repeating exercise from yesterday per patient's request.  (He wanted to compare status from yesterday.)  Improved ability to sustain single leg stance with crossing over midline in each direction.   Walked back to room without walker and close supervision.  Patient able to turn head to read sign while walking without loss of balance!  Left up in recliner in room with chair pad alarm engaged and call bell/ personal items in reach.    Therapy Documentation Precautions:  Precautions Precautions: Fall Precaution Comments: R hemi, list right. Restrictions Weight Bearing Restrictions: No  AM Session Pain:  Denies pain  PM Session Pain:  Denies pain        Therapy/Group: Individual Therapy  [name] 07/18/2023, 2:59 PM

## 2023-07-18 NOTE — Telephone Encounter (Signed)
Rx sent to specialty pharmacy

## 2023-07-18 NOTE — Progress Notes (Signed)
PROGRESS NOTE   Subjective/Complaints: No new complaints this morning He is on gabapentin 300mg  HS and his neuropathy is currently more of a sensory deficit than pain   ROS: Patient denies fever, rash, sore throat, blurred vision, dizziness, nausea, vomiting, diarrhea,   shortness of breath or chest pain, joint or back/neck pain, headache, or mood change. +peripheral neuropathy   Objective:   No results found. No results for input(s): "WBC", "HGB", "HCT", "PLT" in the last 72 hours.  No results for input(s): "NA", "K", "CL", "CO2", "GLUCOSE", "BUN", "CREATININE", "CALCIUM" in the last 72 hours.   Intake/Output Summary (Last 24 hours) at 07/18/2023 1032 Last data filed at 07/18/2023 0900 Gross per 24 hour  Intake 480 ml  Output 1450 ml  Net -970 ml        Physical Exam: Vital Signs Blood pressure 122/69, pulse 77, temperature 97.9 F (36.6 C), resp. rate 18, height 5\' 10"  (1.778 m), weight 94.1 kg, SpO2 95%.  Gen: no distress, normal appearing HEENT: oral mucosa pink and moist, NCAT Cardio: Reg rate Chest: normal effort, normal rate of breathing Abd: soft, non-distended Ext: no edema Psych: pleasant, normal affect Skin: intact Neuro:  Alert and oriented x 3. Normal insight and awareness. Intact Memory. Normal language and speech. Cranial nerve exam unremarkable. MMT: 5/5 LUE and LLE. 4+/5 RUE and RLE. Mild right limb ataxia. Sensory exam normal for light touch and pain in all 4 limbs.  No abnormal tone appreciated. Decreased sensation of feet Musculoskeletal: Full ROM, No pain with AROM or PROM in the neck, trunk, or extremities. Posture appropriate     Assessment/Plan: 1. Functional deficits which require 3+ hours per day of interdisciplinary therapy in a comprehensive inpatient rehab setting. Physiatrist is providing close team supervision and 24 hour management of active medical problems listed  below. Physiatrist and rehab team continue to assess barriers to discharge/monitor patient progress toward functional and medical goals  Care Tool:  Bathing    Body parts bathed by patient: Right arm, Left arm, Chest, Abdomen, Front perineal area, Buttocks, Right upper leg, Left upper leg, Right lower leg, Left lower leg, Face         Bathing assist Assist Level: Supervision/Verbal cueing     Upper Body Dressing/Undressing Upper body dressing   What is the patient wearing?: Pull over shirt    Upper body assist Assist Level: Set up assist    Lower Body Dressing/Undressing Lower body dressing      What is the patient wearing?: Pants (Shorts)     Lower body assist Assist for lower body dressing: Minimal Assistance - Patient > 75%     Toileting Toileting    Toileting assist Assist for toileting: Minimal Assistance - Patient > 75%     Transfers Chair/bed transfer  Transfers assist     Chair/bed transfer assist level: Contact Guard/Touching assist (CGA to MinA)     Locomotion Ambulation   Ambulation assist      Assist level: Moderate Assistance - Patient 50 - 74% Assistive device: No Device Max distance: 90   Walk 10 feet activity   Assist     Assist level: Moderate Assistance - Patient -  50 - 74% Assistive device: No Device   Walk 50 feet activity   Assist    Assist level: Moderate Assistance - Patient - 50 - 74% Assistive device: No Device    Walk 150 feet activity   Assist Walk 150 feet activity did not occur: Safety/medical concerns    Assistive device: Walker-rolling    Walk 10 feet on uneven surface  activity   Assist     Assist level: Minimal Assistance - Patient > 75% Assistive device: Other (comment) (no device.)   Wheelchair     Assist Is the patient using a wheelchair?: Yes Type of Wheelchair: Manual (during dressing)    Wheelchair assist level: Supervision/Verbal cueing Max wheelchair distance: 50     Wheelchair 50 feet with 2 turns activity    Assist        Assist Level: Supervision/Verbal cueing   Wheelchair 150 feet activity     Assist      Assist Level: Maximal Assistance - Patient 25 - 49%   Blood pressure 122/69, pulse 77, temperature 97.9 F (36.6 C), resp. rate 18, height 5\' 10"  (1.778 m), weight 94.1 kg, SpO2 95%.  Medical Problem List and Plan: 1. Functional deficits secondary to right medial inferior cerebellar infarct.             -hx of gait disorder, ?NPH with shuffling, forward leaning gait but was distinctly different than what he's experienced after this stroke.             -patient may shower             -ELOS/Goals: 10-12 days, mod I goals with PT and OT   -Patient is beginning CIR therapies today including PT and OT   Metanx started 2.  Antithrombotics: -DVT/anticoagulation:  Pharmaceutical: Lovenox             -antiplatelet therapy: Aspirin and Plavix for 3 weeks then plavix alone given pt's hx of CAD (cards/neuro on board)   3. Neuropathy: Tylenol as needed             -continue gabapentin 300 mg q HS  Qutenza patches scheduled outpatient   4. Mood/Behavior/Sleep: LCSW to evaluate and provide emotional support             -continue escitalopram 10 mg daily             -continue home trazodone 50 mg q HS--sleeping well             -antipsychotic agents: n/a              5. Neuropsych/cognition: This patient is capable of making decisions on his own behalf.   6. Skin/Wound Care: Routine skin care checks   7. Fluids/Electrolytes/Nutrition: Routine Is and Os and follow-up chemistries   8: Hypertension: monitor TID and prn             -continue metoprolol succinate 100 mg daily             -continue diltiazem 240 mg daily  -continue magnesium gluconate 250mg  HS   9: Hyperlipidemia: continue statin   10: History of CAD; on aspirin and statin  -continue plavix as well             -follow-up with Dr. Dietrich Pates   11: History lung  cancer 2008   12: Asthmatic bronchitis/former smoker, slight exacerbation (at home uses Trelegy/Ventolin)             -continue Breo/Incruse inhalers             -  continue mucinex dm which seems to have helped             -prn duoneb, hycodan             -follow-up with Dr. Tonia Brooms   12: Gait disturbance: recent work-up as outpatient by Dr. Marjory Lies             -to re-schedule LP for findings of ventriculomegaly/NPH   -plavix now long term, so this will need to be considered in scheduling procedure.  -decrease gabapentin to 200mg  HS  14: Chronic HFpEF: check daily weights, messaged nursing to please weigh patient today, weights reviewed and are stable.     Filed Weights   07/15/23 1800 07/16/23 1258 07/17/23 1100  Weight: 92.7 kg 94.4 kg 94.1 kg    15. Hyponatremia: continue to monitor sodium  16. Suboptimal vitamin D: increase D3 to 3,000U daily  LOS: 5 days A FACE TO FACE EVALUATION WAS PERFORMED  Drema Pry Shanyla Marconi 07/18/2023, 10:32 AM

## 2023-07-18 NOTE — Progress Notes (Addendum)
Nursing was notified this patient was drinking coffee and he spit it  back up while coughing. Patient able to clear his throat. Patient reports he is ok. PRN medication implemented and flutter valve. Scant wheezes noted to upper lobes. Notified Dr. Carlis Abbott of all finding. New orders placed.    Oluwatimilehin Balfour Tracee Mccreery,LPN

## 2023-07-19 ENCOUNTER — Other Ambulatory Visit (HOSPITAL_COMMUNITY): Payer: Self-pay

## 2023-07-19 DIAGNOSIS — I639 Cerebral infarction, unspecified: Secondary | ICD-10-CM | POA: Diagnosis not present

## 2023-07-19 MED ORDER — HYDROCOD POLI-CHLORPHE POLI ER 10-8 MG/5ML PO SUER
5.0000 mL | Freq: Two times a day (BID) | ORAL | 0 refills | Status: DC | PRN
Start: 2023-07-19 — End: 2023-08-05
  Filled 2023-07-19: qty 120, 12d supply, fill #0

## 2023-07-19 MED ORDER — AZITHROMYCIN 250 MG PO TABS
ORAL_TABLET | ORAL | 0 refills | Status: DC
  Filled 2023-07-19: qty 2, 2d supply, fill #0

## 2023-07-19 MED ORDER — CLOPIDOGREL BISULFATE 75 MG PO TABS
75.0000 mg | ORAL_TABLET | Freq: Every day | ORAL | 0 refills | Status: DC
  Filled 2023-07-19: qty 6, 6d supply, fill #0

## 2023-07-19 MED ORDER — MAGNESIUM OXIDE 400 MG PO TABS
500.0000 mg | ORAL_TABLET | Freq: Every day | ORAL | 0 refills | Status: AC
  Filled 2023-07-19: qty 30, 20d supply, fill #0

## 2023-07-19 MED ORDER — AZITHROMYCIN 250 MG PO TABS
250.0000 mg | ORAL_TABLET | Freq: Every day | ORAL | Status: DC
  Administered 2023-07-20 – 2023-07-21 (×2): 250 mg via ORAL
  Filled 2023-07-19 (×2): qty 1

## 2023-07-19 MED ORDER — L-METHYLFOLATE-B6-B12 3-35-2 MG PO TABS
1.0000 | ORAL_TABLET | Freq: Every day | ORAL | 0 refills | Status: AC
  Filled 2023-07-19: qty 30, 30d supply, fill #0

## 2023-07-19 MED ORDER — DM-GUAIFENESIN ER 30-600 MG PO TB12: 1.0000 | ORAL_TABLET | Freq: Two times a day (BID) | ORAL | Status: DC

## 2023-07-19 MED ORDER — AZITHROMYCIN 500 MG PO TABS
500.0000 mg | ORAL_TABLET | Freq: Once | ORAL | Status: AC
  Administered 2023-07-19: 500 mg via ORAL
  Filled 2023-07-19: qty 1

## 2023-07-19 MED ORDER — ACETAMINOPHEN 325 MG PO TABS: 325.0000 mg | ORAL_TABLET | ORAL | Status: DC | PRN

## 2023-07-19 MED ORDER — MAGNESIUM GLUCONATE 500 MG PO TABS
500.0000 mg | ORAL_TABLET | Freq: Every day | ORAL | Status: DC
  Administered 2023-07-19 – 2023-07-20 (×2): 500 mg via ORAL
  Filled 2023-07-19 (×2): qty 1

## 2023-07-19 MED ORDER — HYDROCODONE BIT-HOMATROP MBR 5-1.5 MG/5ML PO SOLN
5.0000 mL | Freq: Four times a day (QID) | ORAL | 0 refills | Status: DC | PRN
Start: 2023-07-19 — End: 2023-07-19
  Filled 2023-07-19: qty 120, 6d supply, fill #0

## 2023-07-19 MED ORDER — GUAIFENESIN-DM 100-10 MG/5ML PO SYRP: 10.0000 mL | ORAL_SOLUTION | Freq: Four times a day (QID) | ORAL | Status: DC | PRN

## 2023-07-19 MED ORDER — HYDROCOD POLI-CHLORPHE POLI ER 10-8 MG/5ML PO SUER
5.0000 mL | Freq: Two times a day (BID) | ORAL | Status: DC | PRN
  Administered 2023-07-19 – 2023-07-20 (×2): 5 mL via ORAL
  Filled 2023-07-19 (×2): qty 5

## 2023-07-19 MED ORDER — LORATADINE 10 MG PO TABS: 10.0000 mg | ORAL_TABLET | Freq: Every day | ORAL | Status: DC

## 2023-07-19 MED ORDER — METOPROLOL SUCCINATE ER 50 MG PO TB24
50.0000 mg | ORAL_TABLET | Freq: Every day | ORAL | 0 refills | Status: DC
  Filled 2023-07-19: qty 30, 30d supply, fill #0

## 2023-07-19 NOTE — Progress Notes (Signed)
PROGRESS NOTE   Subjective/Complaints: No new complaints this morning Continues to have congestion and feels like he has a bronchitis infection, has responded well to Z-pak in the past   ROS: Patient denies fever, rash, sore throat, blurred vision, dizziness, nausea, vomiting, diarrhea,   shortness of breath or chest pain, joint or back/neck pain, headache, or mood change. +peripheral neuropathy, +congestion   Objective:   DG Chest 2 View  Result Date: 07/18/2023 CLINICAL DATA:  Productive cough.  Shortness of breath. EXAM: CHEST - 2 VIEW COMPARISON:  06/19/2021 and CT chest from 06/13/2022 FINDINGS: Left Port-A-Cath tip: SVC. Atherosclerotic calcification of the aortic arch. Heart size within normal limits. Right paramediastinal fibrosis noted compatible with prior radiation therapy. No change in contour of the hilum or mediastinal margin from 2022. No blunting of the costophrenic angles. Mild thoracic spondylosis.  No airway thickening. IMPRESSION: 1. No acute findings. 2. Right paramediastinal fibrosis compatible with prior radiation therapy. 3. Mild thoracic spondylosis. Electronically Signed   By: Gaylyn Rong M.D.   On: 07/18/2023 17:39   No results for input(s): "WBC", "HGB", "HCT", "PLT" in the last 72 hours.  No results for input(s): "NA", "K", "CL", "CO2", "GLUCOSE", "BUN", "CREATININE", "CALCIUM" in the last 72 hours.   Intake/Output Summary (Last 24 hours) at 07/19/2023 1012 Last data filed at 07/19/2023 0720 Gross per 24 hour  Intake 480 ml  Output 975 ml  Net -495 ml        Physical Exam: Vital Signs Blood pressure (!) 154/81, pulse 82, temperature 98.7 F (37.1 C), temperature source Oral, resp. rate 18, height 5\' 10"  (1.778 m), weight 94.1 kg, SpO2 92%.  Gen: no distress, normal appearing HEENT: oral mucosa pink and moist, NCAT Cardio: Reg rate Chest: normal effort, normal rate of breathing Abd:  soft, non-distended Ext: no edema Psych: pleasant, normal affect Neuro:  Alert and oriented x 3. Normal insight and awareness. Intact Memory. Normal language and speech. Cranial nerve exam unremarkable. MMT: 5/5 LUE and LLE. 4+/5 RUE and RLE. Mild right limb ataxia. Sensory exam normal for light touch and pain in all 4 limbs.  No abnormal tone appreciated. Decreased sensation of feet Musculoskeletal: Full ROM, No pain with AROM or PROM in the neck, trunk, or extremities. Posture appropriate     Assessment/Plan: 1. Functional deficits which require 3+ hours per day of interdisciplinary therapy in a comprehensive inpatient rehab setting. Physiatrist is providing close team supervision and 24 hour management of active medical problems listed below. Physiatrist and rehab team continue to assess barriers to discharge/monitor patient progress toward functional and medical goals  Care Tool:  Bathing    Body parts bathed by patient: Right arm, Left arm, Chest, Abdomen, Front perineal area, Buttocks, Right upper leg, Left upper leg, Right lower leg, Left lower leg, Face         Bathing assist Assist Level: Supervision/Verbal cueing     Upper Body Dressing/Undressing Upper body dressing   What is the patient wearing?: Pull over shirt    Upper body assist Assist Level: Set up assist    Lower Body Dressing/Undressing Lower body dressing      What is the patient  wearing?: Pants (Shorts)     Lower body assist Assist for lower body dressing: Minimal Assistance - Patient > 75%     Editor, commissioning assist Assist for toileting: Minimal Assistance - Patient > 75%     Transfers Chair/bed transfer  Transfers assist     Chair/bed transfer assist level: Contact Guard/Touching assist (CGA to MinA)     Locomotion Ambulation   Ambulation assist      Assist level: Moderate Assistance - Patient 50 - 74% Assistive device: No Device Max distance: 90   Walk 10 feet  activity   Assist     Assist level: Moderate Assistance - Patient - 50 - 74% Assistive device: No Device   Walk 50 feet activity   Assist    Assist level: Moderate Assistance - Patient - 50 - 74% Assistive device: No Device    Walk 150 feet activity   Assist Walk 150 feet activity did not occur: Safety/medical concerns    Assistive device: Walker-rolling    Walk 10 feet on uneven surface  activity   Assist     Assist level: Minimal Assistance - Patient > 75% Assistive device: Other (comment) (no device.)   Wheelchair     Assist Is the patient using a wheelchair?: Yes Type of Wheelchair: Manual (during dressing)    Wheelchair assist level: Supervision/Verbal cueing Max wheelchair distance: 50    Wheelchair 50 feet with 2 turns activity    Assist        Assist Level: Supervision/Verbal cueing   Wheelchair 150 feet activity     Assist      Assist Level: Maximal Assistance - Patient 25 - 49%   Blood pressure (!) 154/81, pulse 82, temperature 98.7 F (37.1 C), temperature source Oral, resp. rate 18, height 5\' 10"  (1.778 m), weight 94.1 kg, SpO2 92%.  Medical Problem List and Plan: 1. Functional deficits secondary to right medial inferior cerebellar infarct.             -hx of gait disorder, ?NPH with shuffling, forward leaning gait but was distinctly different than what he's experienced after this stroke.             -patient may shower             -ELOS/Goals: 10-12 days, mod I goals with PT and OT   -Patient is beginning CIR therapies today including PT and OT   Metanx started  Discussed plan for d/c sunday 2.  Antithrombotics: -DVT/anticoagulation:  Pharmaceutical: Lovenox             -antiplatelet therapy: Aspirin and Plavix for 3 weeks then plavix alone given pt's hx of CAD (cards/neuro on board)   3. Neuropathy: Tylenol as needed             -continue gabapentin 300 mg q HS  Qutenza patches scheduled outpatient   4.  Mood/Behavior/Sleep: LCSW to evaluate and provide emotional support             -continue escitalopram 10 mg daily             -continue home trazodone 50 mg q HS--sleeping well             -antipsychotic agents: n/a              5. Neuropsych/cognition: This patient is capable of making decisions on his own behalf.   6. Skin/Wound Care: Routine skin care checks   7. Fluids/Electrolytes/Nutrition: Routine Is  and Os and follow-up chemistries   8: Hypertension: monitor TID and prn             -continue metoprolol succinate 100 mg daily             -continue diltiazem 240 mg daily  -increase magnesium to 500mg  HS   9: Hyperlipidemia: continue statin   10: History of CAD; on aspirin and statin  -continue plavix as well             -follow-up with Dr. Dietrich Pates   11: History lung cancer 2008   12: Asthmatic bronchitis/former smoker, slight exacerbation (at home uses Trelegy/Ventolin)             -continue Breo/Incruse inhalers             -continue mucinex dm which seems to have helped             -prn duoneb, hycodan             -follow-up with Dr. Tonia Brooms   12: Gait disturbance: recent work-up as outpatient by Dr. Marjory Lies             -to re-schedule LP for findings of ventriculomegaly/NPH   -plavix now long term, so this will need to be considered in scheduling procedure.  -decrease gabapentin to 200mg  HS  -discussed that Qutenza can help with peripheral neuropathy  14: Chronic HFpEF: check daily weights, messaged nursing to please weigh patient today, weights reviewed and are stable. Discussed that CXR shows no edema.     Filed Weights   07/15/23 1800 07/16/23 1258 07/17/23 1100  Weight: 92.7 kg 94.4 kg 94.1 kg    15. Hyponatremia: continue to monitor sodium  16. Suboptimal vitamin D: increase D3 to 3,000U daily  17. Congestion/bronchitis: Z-pak ordered, discussed findings of radiation fibrosis on chest XR. Incentive spirometer ordered.   LOS: 6 days A FACE TO FACE  EVALUATION WAS PERFORMED  Cinzia Devos P Zhoe Catania 07/19/2023, 10:12 AM

## 2023-07-19 NOTE — Progress Notes (Signed)
Physical Therapy Session Note  Patient Details  Name: Jeremy Lopez MRN: [id number] Date of Birth: 07-06-1951  Today's Date: 07/19/2023 PT Individual Time: 4098-1191 PT Individual Time Calculation (min): 70 min   Short Term Goals: Week 1:  PT Short Term Goal 1 (Week 1): STG=LTG 2/2 ELOS.  Skilled Therapeutic Interventions/Progress Updates:      Pt sitting in recliner to start - agreeable to therapy treatment and no reports of pain. Sit<>stand to RW with supervision. He ambulates with RW and CGA from his room downstairs, to outdoors near [facility] - standing rest break in the elevator. Presents with shuffling steps,  R trunk lean, and decreased heel strike on R during initial contact. Cues throughout for correction. Outdoors, practiced unlevel gait training on paved surfaces to challenge stability over cracks and uneven/unfamiliar terrain. Seated rest breaks as needed on park benches with safety cues for approach. Able to ambulate back upstairs to CIR floor with CGA/minA with increased fatigue resulting in more of a R trunk lean and shuffled steps.   Completed Biodex training with Limits of Stability with supervision for safety while unsupported. Pt showing 70% (R) vs 30% (L) in standing weight bearing %.  Worked on fixing this and improving L weight shift.   NMR standing balance on inverse bosu ball in // bars with CGA/minA for stability - unsupported vs BUE support, all eyes open. Upgraded task with ball toss to 2nd person. Persistent R trunk lean with cues for weight shifting to manage. minA needed for ball toss while standing on inverse bosu ball.   Pt ambulated back to his room with CGA and RW with similar deficits and cues as above. Left sitting in recliner with chair pad alarm on, call bell in reach.     Therapy Documentation Precautions:  Precautions Precautions: Fall Precaution Comments: R hemi, list right. Restrictions Weight Bearing Restrictions: No General:      Therapy/Group: Individual Therapy  [name] 07/19/2023, 2:58 PM

## 2023-07-19 NOTE — Progress Notes (Signed)
Occupational Therapy Session Note  Patient Details  Name: LEN AZEEZ MRN: [id number] Date of Birth: Mar 13, 1951  Today's Date: 07/19/2023 OT Individual Time: 4098-1191 OT Individual Time Calculation (min): 40 min    Short Term Goals: Week 1:  OT Short Term Goal 1 (Week 1): The pt will improve UB stength to 4+/5 MMT for increase Ind at 95% safe OT Short Term Goal 2 (Week 1): The pt improve activity tolerance to good with occassional rest breaks at 95% safe. OT Short Term Goal 3 (Week 1): The pt will complete LB task in dressing at Ind LOF @ 95% safe OT Short Term Goal 4 (Week 1): The pt will improve static and dynamic balance during functional mobility to good at 95% safe. OT Short Term Goal 5 (Week 1): The pt will complete all functional transfers at ModI and 95% safe for safe transition to home.  Skilled Therapeutic Interventions/Progress Updates:    Patient received seated in recliner.  Agreeable to OT and to shower. Patient walked without assistive device to shower and sat for shower.  Patient showered and dressed himself with modified independence.  In therapy to challenge balance, walking without walker and close supervision to occasional contact guard - but goal is walking with walker.  Patient has a treadmill at home, and wanted to determine if he is safe to use at home.  Patient walked on treadmill at 1.0 mph x 2 min with close supervision to contact guard and use of hand rails.  Patient needing cueing to not drag both feet at times.  Continued cueing relating to upright posture and forward gaze.   Left up in recliner in room with chair pad alarm in place and engaged and call bell in reach.    Therapy Documentation Precautions:  Precautions Precautions: Fall Precaution Comments: R hemi, list right. Restrictions Weight Bearing Restrictions: No   Pain: Pain Assessment Pain Scale: 0-10 Pain Score: 0-No pain      Therapy/Group: Individual Therapy  [name] 07/19/2023, 12:26 PM

## 2023-07-19 NOTE — Progress Notes (Signed)
Inpatient Rehabilitation Care Coordinator Discharge Note DC SUNDAY 10/13  Patient Details  Name: Jeremy Lopez MRN: [id number] Date of Birth: 1951-09-21   Discharge location: HOME WITH WIFE WHO IS ABE TO PROVIDE 24/7  Length of Stay: 8 DAYS  Discharge activity level: SUPERVISION-MOD/I LEVEL  Home/community participation: ACTIVE  Patient response KG:MWNUUV Literacy - How often do you need to have someone help you when you read instructions, pamphlets, or other written material from your doctor or pharmacy?: Never  Patient response OZ:DGUYQI Isolation - How often do you feel lonely or isolated from those around you?: Never  Services provided included: MD, RD, PT, OT, RN, CM, Pharmacy, SW  Financial Services:  Financial Services Utilized: Private Insurance HEALTH TEAM ADVANTAGE  Choices offered to/list presented to: PT AND WIFE  Follow-up services arranged:  Outpatient, Patient/Family has no preference for HH/DME agencies    Outpatient Servicies: [facility]-PT & OT WILL CAL WIFE TO SET UP FOLLOW UP APPOINTMENTS PT TO BORROW A ROLLING WALKER FROM HIS NEIGHBOR NO OTHER EQUIPMENT NEEDS      Patient response to transportation need: Is the patient able to respond to transportation needs?: Yes In the past 12 months, has lack of transportation kept you from medical appointments or from getting medications?: No In the past 12 months, has lack of transportation kept you from meetings, work, or from getting things needed for daily living?: No   Patient/Family verbalized understanding of follow-up arrangements:  Yes  Individual responsible for coordination of the follow-up plan: SELF AND [name]-WIFE [phone]  Confirmed correct DME delivered: Lucy Chris 07/19/2023    Comments (or additional information):WIFE WAS IN FOR HANDS ON EDUCATION AND IT WENT WELL. PT READY TO GO HOME.  Summary of Stay    Date/Time Discharge Planning CSW  07/17/23 [phone] HOme with wife  who is in good health and can assist, pt is doing well. Await team's recommendations for equipment and follow up RGD       Ulus Hazen, Lemar Livings

## 2023-07-19 NOTE — Progress Notes (Signed)
Physical Therapy Session Note  Patient Details  Name: Jeremy Lopez MRN: [id number] Date of Birth: [birth year]  Today's Date: 07/19/2023 PT Individual Time: 0800-0855 PT Individual Time Calculation (min): 55 min   Short Term Goals: Week 1:  PT Short Term Goal 1 (Week 1): STG=LTG 2/2 ELOS.  Skilled Therapeutic Interventions/Progress Updates:      Pt sitting in recliner to start - no reports of pain but does report chest discomfort and congestion - wanting to speak to MD. Suggested using Incentive Spirometer and this was provided to him at the end of session - educated on use and dosage.   Pt sit<>stand to RW with CGA from recliner - ambulated with CGA and RW from his room to ortho rehab gym - short step length on R > L, mild R trunk lean. Assisted onto Nustep with CGA and he completed x10 minutes at L7 resistance using BUE/BLE to target general strengthening and endurance training - cues for full AROM bilaterally.   Gait training ~156ft with CGA and RW into main rehab gym with cues for lengthening stride length and keeping forward gaze.  5xSTS completed from mat table - 24 seconds. Scores > 15 seconds indicate increased falls risk.  BERG balance test completed with results outlined below. Seated rest breaks as needed.   Patient demonstrates increased fall risk as noted by score of   41/56 on Berg Balance Scale.  (<36= high risk for falls, close to 100%; 37-45 significant >80%; 46-51 moderate >50%; 52-55 lower >25%)  Pt educated on falls risk from testing, recommending RW for mobility to address falls risk.   Returned to his room and patient provided and educated on proper use of Facilities manager for pulmonary toileting - teachback method used and patient demonstrated adequate ability to perform.   Left in recliner with all needs met, aware of upcoming therapy schedule.   Therapy Documentation Precautions:  Precautions Precautions: Fall Precaution Comments: R hemi, list  right. Restrictions Weight Bearing Restrictions: No General:  Balance: Balance Balance Assessed: (P) Yes Standardized Balance Assessment Standardized Balance Assessment: (P) Berg Balance Test Berg Balance Test Sit to Stand: (P) Able to stand without using hands and stabilize independently Standing Unsupported: (P) Able to stand safely 2 minutes Sitting with Back Unsupported but Feet Supported on Floor or Stool: (P) Able to sit safely and securely 2 minutes Stand to Sit: (P) Sits safely with minimal use of hands Transfers: (P) Able to transfer safely, minor use of hands Standing Unsupported with Eyes Closed: (P) Able to stand 10 seconds safely Standing Ubsupported with Feet Together: (P) Needs help to attain position and unable to hold for 15 seconds From Standing, Reach Forward with Outstretched Arm: (P) Can reach confidently >25 cm (10") From Standing Position, Pick up Object from Floor: (P) Able to pick up shoe, needs supervision From Standing Position, Turn to Look Behind Over each Shoulder: (P) Looks behind from both sides and weight shifts well Turn 360 Degrees: (P) Able to turn 360 degrees safely but slowly Standing Unsupported, Alternately Place Feet on Step/Stool: (P) Able to complete >2 steps/needs minimal assist Standing Unsupported, One Foot in Front: (P) Able to plae foot ahead of the other independently and hold 30 seconds Standing on One Leg: (P) Unable to try or needs assist to prevent fall Total Score: (P) 41  Therapy/Group: Individual Therapy  Michayla Mcneil P Boen Sterbenz  PT, DPT, CSRS  07/19/2023, 8:53 AM

## 2023-07-20 ENCOUNTER — Other Ambulatory Visit (HOSPITAL_COMMUNITY): Payer: Self-pay

## 2023-07-20 DIAGNOSIS — I10 Essential (primary) hypertension: Secondary | ICD-10-CM

## 2023-07-20 DIAGNOSIS — I639 Cerebral infarction, unspecified: Secondary | ICD-10-CM | POA: Diagnosis not present

## 2023-07-20 MED ORDER — ASPIRIN 81 MG PO TBEC: 81.0000 mg | DELAYED_RELEASE_TABLET | Freq: Every day | ORAL | Status: AC

## 2023-07-20 MED ORDER — CLOPIDOGREL BISULFATE 75 MG PO TABS
75.0000 mg | ORAL_TABLET | Freq: Every day | ORAL | 0 refills | Status: DC
  Filled 2023-07-20: qty 30, 30d supply, fill #0

## 2023-07-20 NOTE — Progress Notes (Signed)
Occupational Therapy Session Note  Patient Details  Name: Jeremy Lopez MRN: [id number] Date of Birth: 03-Mar-1951  Today's Date: 07/20/2023 OT Individual Time: 0915-1000 OT Individual Time Calculation (min): 45 min    Short Term Goals: Week 1:  OT Short Term Goal 1 (Week 1): The pt will improve UB stength to 4+/5 MMT for increase Ind at 95% safe OT Short Term Goal 2 (Week 1): The pt improve activity tolerance to good with occassional rest breaks at 95% safe. OT Short Term Goal 3 (Week 1): The pt will complete LB task in dressing at Ind LOF @ 95% safe OT Short Term Goal 4 (Week 1): The pt will improve static and dynamic balance during functional mobility to good at 95% safe. OT Short Term Goal 5 (Week 1): The pt will complete all functional transfers at ModI and 95% safe for safe transition to home. :     Skilled Therapeutic Interventions/Progress Updates:    Pt received in recliner ready for therapy.  Focus of therapy session on safe functional mobility with self care.   Pt actively moved around the room with RW with Mod Ind, he stood to complete grooming, toileted, showered in standing with NO equipment and dressed sitting on chair in bathroom.   Pt discussed his activity level at home and his goals once he gets home. Pt demonstrated excellent balance and safety awareness.  Pt is now mod independent in his room with the RW.      Pt resting in recliner with all needs met.   Therapy Documentation Precautions:  Precautions Precautions: Fall Precaution Comments: R hemi, list right. Restrictions Weight Bearing Restrictions: No  Pain: Pain Assessment Pain Scale: 0-10 Pain Score: 0-No pain  ADL: ADL Eating: Independent Where Assessed-Eating: Edge of bed, Wheelchair (based on observation) Grooming: Independent Where Assessed-Grooming: Standing at sink Upper Body Bathing: Independent Where Assessed-Upper Body Bathing: Shower Lower Body Bathing: Independent Where  Assessed-Lower Body Bathing: Shower Upper Body Dressing: Independent Where Assessed-Upper Body Dressing: Chair Lower Body Dressing: Modified independent Where Assessed-Lower Body Dressing: Chair Toileting: Modified independent Where Assessed-Toileting: Teacher, adult education: Modified Community education officer Method: Insurance claims handler: Other (comment) (n/a) Tub/Shower Equipment: Information systems manager with back, Acupuncturist: Modified independent Film/video editor Method: Designer, industrial/product: Other (comment) (no equipment)  Therapy/Group: Individual Therapy  Mazikeen Hehn 07/20/2023, 9:45 AM

## 2023-07-20 NOTE — Plan of Care (Signed)
Patient had a quite night. Cough syrup effective. Safety maintained.

## 2023-07-20 NOTE — Plan of Care (Signed)
  Problem: RH Balance Goal: LTG Patient will maintain dynamic sitting balance (PT) Description: LTG:  Patient will maintain dynamic sitting balance with assistance during mobility activities (PT) Outcome: Completed/Met Goal: LTG Patient will maintain dynamic standing balance (PT) Description: LTG:  Patient will maintain dynamic standing balance with assistance during mobility activities (PT) Outcome: Completed/Met   Problem: Sit to Stand Goal: LTG:  Patient will perform sit to stand with assistance level (PT) Description: LTG:  Patient will perform sit to stand with assistance level (PT) Outcome: Completed/Met   Problem: RH Bed Mobility Goal: LTG Patient will perform bed mobility with assist (PT) Description: LTG: Patient will perform bed mobility with assistance, with/without cues (PT). Outcome: Completed/Met   Problem: RH Bed to Chair Transfers Goal: LTG Patient will perform bed/chair transfers w/assist (PT) Description: LTG: Patient will perform bed to chair transfers with assistance (PT). Outcome: Completed/Met   Problem: RH Car Transfers Goal: LTG Patient will perform car transfers with assist (PT) Description: LTG: Patient will perform car transfers with assistance (PT). Outcome: Completed/Met   Problem: RH Furniture Transfers Goal: LTG Patient will perform furniture transfers w/assist (OT/PT) Description: LTG: Patient will perform furniture transfers  with assistance (OT/PT). Outcome: Completed/Met   Problem: RH Ambulation Goal: LTG Patient will ambulate in controlled environment (PT) Description: LTG: Patient will ambulate in a controlled environment, # of feet with assistance (PT). Outcome: Completed/Met Goal: LTG Patient will ambulate in home environment (PT) Description: LTG: Patient will ambulate in home environment, # of feet with assistance (PT). Outcome: Completed/Met   Problem: RH Wheelchair Mobility Goal: LTG Patient will propel w/c in controlled environment  (PT) Description: LTG: Patient will propel wheelchair in controlled environment, # of feet with assist (PT) 07/20/2023 1403 by Wynelle Link P, PT Outcome: Completed/Met 07/20/2023 0736 by [name], PT Outcome: Not Met (add Reason) Goal: LTG Patient will propel w/c in home environment (PT) Description: LTG: Patient will propel wheelchair in home environment, # of feet with assistance (PT). 07/20/2023 1403 by Shalinda Burkholder P, PT Outcome: Completed/Met 07/20/2023 0736 by Wynelle Link P, PT Outcome: Not Met (add Reason)   Problem: RH Stairs Goal: LTG Patient will ambulate up and down stairs w/assist (PT) Description: LTG: Patient will ambulate up and down # of stairs with assistance (PT) Outcome: Completed/Met

## 2023-07-20 NOTE — Progress Notes (Signed)
Physical Therapy Discharge Summary  Patient Details  Name: Jeremy Lopez MRN: [id number] Date of Birth: Mar 28, 1951  Date of Discharge from PT service:July 20, 2023  Today's Date: 07/20/2023 PT Individual Time: 1300-1400 PT Individual Time Calculation (min): 60 min    Patient has met 10 of 10 long term goals due to improved activity tolerance, improved balance, improved postural control, increased strength, ability to compensate for deficits, functional use of  right upper extremity and right lower extremity, improved attention, and improved awareness.  Patient to discharge at an ambulatory level Modified Independent.   Patient's care partner is independent to provide the necessary physical assistance at discharge.  Reasons goals not met: n/a  Functional Outcome measures: BERG 41/56 850ft with RW 5xSTS 24 seconds FGA 15/30  Recommendation:  Patient will benefit from ongoing skilled PT services in outpatient setting to continue to advance safe functional mobility, address ongoing impairments in R sided lean, impaired dynamic standing, generalized deconditioning and muscle weakness, and minimize fall risk.  Equipment: No equipment provided - pt owns a RW  Reasons for discharge: treatment goals met and discharge from hospital  Patient/family agrees with progress made and goals achieved: Yes  PT Discharge Precautions/Restrictions Precautions Precautions: Fall Precaution Comments: R hemi, list right. Restrictions Weight Bearing Restrictions: No Pain Interference Pain Interference Pain Effect on Sleep: 0. Does not apply - I have not had any pain or hurting in the past 5 days Pain Interference with Therapy Activities: 0. Does not apply - I have not received rehabilitationtherapy in the past 5 days Pain Interference with Day-to-Day Activities: 1. Rarely or not at all Vision/Perception  Vision - History Ability to See in Adequate Light: 0 Adequate Perception Perception:  Within Functional Limits Praxis Praxis: WFL  Cognition Overall Cognitive Status: Within Functional Limits for tasks assessed Arousal/Alertness: Awake/alert Orientation Level: Oriented X4 Attention: Focused;Sustained;Selective Focused Attention: Appears intact Sustained Attention: Appears intact Selective Attention: Appears intact Awareness: Appears intact Problem Solving: Appears intact Safety/Judgment: Appears intact Sensation Sensation Light Touch: Appears Intact Hot/Cold: Appears Intact Proprioception: Appears Intact Stereognosis: Appears Intact Coordination Gross Motor Movements are Fluid and Coordinated: Yes Fine Motor Movements are Fluid and Coordinated: Yes Heel Shin Test: [facility] Motor  Motor Motor: Other (comment);Hemiplegia Motor - Discharge Observations: mild R hemi; generalized deconditioning and weakness  Mobility Bed Mobility Bed Mobility: Supine to Sit;Sit to Supine;Rolling Left;Rolling Right Rolling Right: Independent Rolling Left: Independent Supine to Sit: Independent Sit to Supine: Independent Transfers Transfers: Sit to Stand;Stand to Sit;Stand Pivot Transfers Sit to Stand: Independent with assistive device Stand to Sit: Independent with assistive device Stand Pivot Transfers: Independent with assistive device Transfer (Assistive device): Rolling walker Locomotion  Gait Ambulation: Yes Gait Assistance: Independent with assistive device Gait Distance (Feet): 150 Feet Assistive device: Rolling walker Gait Assistance Details: Other (comment) (R trunk lean) Gait Gait: Yes Gait Pattern: Within Functional Limits Gait Pattern: Lateral trunk lean to right Stairs / Additional Locomotion Stairs: Yes Stairs Assistance: Supervision/Verbal cueing Stair Management Technique: Two rails;Alternating pattern;Forwards Number of Stairs: 12 Height of Stairs: 6 Wheelchair Mobility Wheelchair Mobility: No  Trunk/Postural Assessment  Cervical Assessment Cervical  Assessment: Within Functional Limits Thoracic Assessment Thoracic Assessment: Within Functional Limits Lumbar Assessment Lumbar Assessment: Within Functional Limits Postural Control Postural Control: Deficits on evaluation Trunk Control: R trunk lean in standing  Balance Balance Balance Assessed: Yes Balance Balance Assessed: (P) Yes Standardized Balance Assessment Standardized Balance Assessment: (P) Berg Balance Test Berg Balance Test Sit to Stand: (P) Able to stand  without using hands and stabilize independently Standing Unsupported: (P) Able to stand safely 2 minutes Sitting with Back Unsupported but Feet Supported on Floor or Stool: (P) Able to sit safely and securely 2 minutes Stand to Sit: (P) Sits safely with minimal use of hands Transfers: (P) Able to transfer safely, minor use of hands Standing Unsupported with Eyes Closed: (P) Able to stand 10 seconds safely Standing Ubsupported with Feet Together: (P) Needs help to attain position and unable to hold for 15 seconds From Standing, Reach Forward with Outstretched Arm: (P) Can reach confidently >25 cm (10") From Standing Position, Pick up Object from Floor: (P) Able to pick up shoe, needs supervision From Standing Position, Turn to Look Behind Over each Shoulder: (P) Looks behind from both sides and weight shifts well Turn 360 Degrees: (P) Able to turn 360 degrees safely but slowly Standing Unsupported, Alternately Place Feet on Step/Stool: (P) Able to complete >2 steps/needs minimal assist Standing Unsupported, One Foot in Front: (P) Able to plae foot ahead of the other independently and hold 30 seconds Standing on One Leg: (P) Unable to try or needs assist to prevent fall Total Score: (P) 41/56 Extremity Assessment      RLE Assessment RLE Assessment: Exceptions to [facility] General Strength Comments: Grossly 4/5 LLE Assessment LLE Assessment: Within Functional Limits   Skilled Treatment: Pt sitting in recliner to start -  agreeable to PT tx. No reports of pain. Pt is excited to go home, appreciative of being made mod I in the room - allows him to gain more confidence before discharging home. Daughter leaving a message asking for HEP to be provided - HEP developed and printed handout for patient.   Pt completed functional outcome measure testing ( , FGA) with results outlined above.   Pt assisted on Nustep and he completed 10 minutes at L5 resistance using BUE/BLE. He achieved 660 steps in total.   Returned to his room and ended session seated in recliner with all needs met. Pt mod I in room.   Access Code: [email or web address] URL: [email or web address] Date: 07/20/2023 Prepared by: Wynelle Link  Exercises - Standing Tandem Balance with Counter Support  - 1 x daily - 7 x weekly - 3 sets - 10 reps - Standing Single Leg Stance with Counter Support  - 1 x daily - 7 x weekly - 3 sets - 10 reps - Sit to Stand with Counter Support  - 1 x daily - 7 x weekly - 3 sets - 10 reps - Standing March with Counter Support  - 1 x daily - 7 x weekly - 3 sets - 10 reps - Heel Raises with Counter Support  - 1 x daily - 7 x weekly - 3 sets - 10 reps - Mini Squat with Counter Support  - 1 x daily - 7 x weekly - 3 sets - 10 reps - Narrow Stance with Counter Support  - 1 x daily - 7 x weekly - 3 sets - 10 reps - Side Stepping with Resistance at Ankles and Counter Support  - 1 x daily - 7 x weekly - 3 sets - 10 reps - Clam with Resistance  - 1 x daily - 7 x weekly - 3 sets - 10 reps - Seated Hip Abduction with Resistance  - 1 x daily - 7 x weekly - 3 sets - 10 reps - Bridge with Hip Abduction and Resistance  - 1 x daily - 7 x weekly - 3 sets - 10 reps -  Standing Hip Abduction with Resistance at Ankles and Counter Support  - 1 x daily - 7 x weekly - 3 sets - 10 reps   [employer]  PT, DPT, CSRS  07/20/2023, 7:46 AM

## 2023-07-20 NOTE — Progress Notes (Signed)
Inpatient Rehabilitation Discharge Medication Review by a Pharmacist  A complete drug regimen review was completed for this patient to identify any potential clinically significant medication issues.  High Risk Drug Classes Is patient taking? Indication by Medication  Antipsychotic No   Anticoagulant No   Antibiotic Yes Azithromycin for bronchitis - 5 day course  Opioid Yes PRN Tussionex - cough  Antiplatelet Yes Aspirin and Plavix (DAPT x 3 weeks followed by plavix alone): CAD s/p PCI/DES  Hypoglycemics/insulin No   Vasoactive Medication Yes Metoprolol XL - hypertension  Chemotherapy No   Other Yes Rosuvastatin - hyperlipidemia Escitalopram, Trazodone - mood/behavior/sleep Gabapentin - neuropathic pain Loratadine, Trelegy inhaler - asthmatic bronchitis Metanx, Vitamin D, magnesium oxide - supplements Mucinex DM tablets- expectorant and antitussive Qutenza patch - peripheral neuropathy, planned on 07/22/23.  PRNs: Acetaminophen - mild pain Guaifenesin-dextromethorphan syrup - cough Nitroglycerin SL - chest pain     Type of Medication Issue Identified Description of Issue Recommendation(s)  Drug Interaction(s) (clinically significant)     Duplicate Therapy     Allergy     No Medication Administration End Date     Incorrect Dose     Additional Drug Therapy Needed     Significant med changes from prior encounter (inform family/care partners about these prior to discharge). Metoprolol XL dose change Diltiazem discontinued on 07/12/23 due to orthostasis. Jardiance held > not resuming. New Azithromycin, Mucinex DM, Metanx, loratadine, magnesium oxide, PRN Tussionex, guaifenesin DM syrup. Communicate medication changes with patient/family at discharge  Other  Aspirin and Clopidogrel stop times were not consistent with the noted plan.   Virgel Bouquet and Incruse for Trelegy while inpatient.  Discussed with M. Street, PA-C, stop time for aspirin updated and stop time removed for  Clopidogrel.  Back to Trelegy at discharge.    Clinically significant medication issues were identified that warrant physician communication and completion of prescribed/recommended actions by midnight of the next day:  Yes  Provider notified: Rhona Raider, PA-C  Method of communication: Fish farm manager and face-to-face  Pharmacist comments:  - reviewed stop times for Aspirin and Clopidogrel with M. Street, PA-C.  - Aspirin for 3 weeks, stop time updated. Clopidogrel to continue, stop time remove  - Gabapentin dose decreased on 10/10 > back to home dose at discharge. Monitor for any need to re-adjust regimen as outpatient.    Time spent performing this drug regimen review (minutes): 30   Thank you for allowing pharmacy to be a part of this patient's care.   Dennie Fetters, [place] 07/20/2023 12:45 PM

## 2023-07-20 NOTE — Plan of Care (Signed)
DC goals - pt will be a functional ambulator by DC so now wheelchair mobility addressed   Problem: RH Wheelchair Mobility Goal: LTG Patient will propel w/c in controlled environment (PT) Description: LTG: Patient will propel wheelchair in controlled environment, # of feet with assist (PT) Outcome: Not Met (add Reason) Goal: LTG Patient will propel w/c in home environment (PT) Description: LTG: Patient will propel wheelchair in home environment, # of feet with assistance (PT). Outcome: Not Met (add Reason)

## 2023-07-20 NOTE — Progress Notes (Signed)
PROGRESS NOTE   Subjective/Complaints:  Pt doing well, slept great, denies pain, LBM yesterday (documented 2 days ago), urinating fine. Denies any other complaints or concerns today.    ROS: Patient denies fever, rash, sore throat, blurred vision, dizziness, nausea, vomiting, diarrhea,   shortness of breath or chest pain, joint or back/neck pain, headache, or mood change. +peripheral neuropathy, +congestion   Objective:   DG Chest 2 View  Result Date: 07/18/2023 CLINICAL DATA:  Productive cough.  Shortness of breath. EXAM: CHEST - 2 VIEW COMPARISON:  06/19/2021 and CT chest from 06/13/2022 FINDINGS: Left Port-A-Cath tip: SVC. Atherosclerotic calcification of the aortic arch. Heart size within normal limits. Right paramediastinal fibrosis noted compatible with prior radiation therapy. No change in contour of the hilum or mediastinal margin from 2022. No blunting of the costophrenic angles. Mild thoracic spondylosis.  No airway thickening. IMPRESSION: 1. No acute findings. 2. Right paramediastinal fibrosis compatible with prior radiation therapy. 3. Mild thoracic spondylosis. Electronically Signed   By: Gaylyn Rong M.D.   On: 07/18/2023 17:39   No results for input(s): "WBC", "HGB", "HCT", "PLT" in the last 72 hours.  No results for input(s): "NA", "K", "CL", "CO2", "GLUCOSE", "BUN", "CREATININE", "CALCIUM" in the last 72 hours.   Intake/Output Summary (Last 24 hours) at 07/20/2023 1025 Last data filed at 07/20/2023 0746 Gross per 24 hour  Intake 757 ml  Output 800 ml  Net -43 ml        Physical Exam: Vital Signs Blood pressure (!) 142/81, pulse 72, temperature 97.8 F (36.6 C), resp. rate 18, height 5\' 10"  (1.778 m), weight 94.1 kg, SpO2 96%.  Gen: no distress, normal appearing, sitting in chair HEENT: oral mucosa pink and moist, NCAT Cardio: Reg rate and rhythm, no m/r/g appreciated Chest: normal effort, normal  rate of breathing, CTAB Abd: soft, non-distended, nonTTP, +BS throughout Ext: no edema Psych: pleasant, normal affect  PRIOR EXAMS: Neuro:  Alert and oriented x 3. Normal insight and awareness. Intact Memory. Normal language and speech. Cranial nerve exam unremarkable. MMT: 5/5 LUE and LLE. 4+/5 RUE and RLE. Mild right limb ataxia. Sensory exam normal for light touch and pain in all 4 limbs.  No abnormal tone appreciated. Decreased sensation of feet Musculoskeletal: Full ROM, No pain with AROM or PROM in the neck, trunk, or extremities. Posture appropriate     Assessment/Plan: 1. Functional deficits which require 3+ hours per day of interdisciplinary therapy in a comprehensive inpatient rehab setting. Physiatrist is providing close team supervision and 24 hour management of active medical problems listed below. Physiatrist and rehab team continue to assess barriers to discharge/monitor patient progress toward functional and medical goals  Care Tool:  Bathing    Body parts bathed by patient: Right arm, Left arm, Chest, Abdomen, Front perineal area, Buttocks, Right upper leg, Left upper leg, Right lower leg, Left lower leg, Face         Bathing assist Assist Level: Independent with assistive device Assistive Device Comment: grab bar, tub bench   Upper Body Dressing/Undressing Upper body dressing   What is the patient wearing?: Pull over shirt    Upper body assist Assist Level: Independent with assistive  device    Lower Body Dressing/Undressing Lower body dressing      What is the patient wearing?: Pants     Lower body assist Assist for lower body dressing: Independent     Toileting Toileting    Toileting assist Assist for toileting: Independent with assistive device Assistive Device Comment: RW   Transfers Chair/bed transfer  Transfers assist     Chair/bed transfer assist level: Independent with assistive device Chair/bed transfer assistive device: Armrests,  Cane   Locomotion Ambulation   Ambulation assist      Assist level: Independent with assistive device Assistive device: Walker-rolling Max distance: 111ft   Walk 10 feet activity   Assist     Assist level: Independent with assistive device Assistive device: Walker-rolling   Walk 50 feet activity   Assist    Assist level: Independent with assistive device Assistive device: Walker-rolling    Walk 150 feet activity   Assist Walk 150 feet activity did not occur: Safety/medical concerns  Assist level: Independent with assistive device Assistive device: Walker-rolling    Walk 10 feet on uneven surface  activity   Assist     Assist level: Contact Guard/Touching assist Assistive device: Walker-rolling   Wheelchair     Assist Is the patient using a wheelchair?: No Type of Wheelchair: Manual (during dressing)    Wheelchair assist level: Supervision/Verbal cueing Max wheelchair distance: 50    Wheelchair 50 feet with 2 turns activity    Assist        Assist Level: Supervision/Verbal cueing   Wheelchair 150 feet activity     Assist      Assist Level: Maximal Assistance - Patient 25 - 49%   Blood pressure (!) 142/81, pulse 72, temperature 97.8 F (36.6 C), resp. rate 18, height 5\' 10"  (1.778 m), weight 94.1 kg, SpO2 96%.  Medical Problem List and Plan: 1. Functional deficits secondary to right medial inferior cerebellar infarct.             -hx of gait disorder, ?NPH with shuffling, forward leaning gait but was distinctly different than what he's experienced after this stroke.             -patient may shower             -ELOS/Goals: 10-12 days, mod I goals with PT and OT   -Patient is beginning CIR therapies today including PT and OT   Metanx started  Discussed plan for d/c sunday 2.  Antithrombotics: -DVT/anticoagulation:  Pharmaceutical: Lovenox             -antiplatelet therapy: Aspirin and Plavix for 3 weeks then plavix alone  given pt's hx of CAD (cards/neuro on board)   3. Neuropathy: Tylenol as needed             -continue gabapentin 300 mg q HS  Qutenza patches scheduled outpatient   4. Mood/Behavior/Sleep: LCSW to evaluate and provide emotional support             -continue escitalopram 10 mg daily             -continue home trazodone 50 mg q HS--sleeping well             -antipsychotic agents: n/a              5. Neuropsych/cognition: This patient is capable of making decisions on his own behalf.   6. Skin/Wound Care: Routine skin care checks   7. Fluids/Electrolytes/Nutrition: Routine Is and Os and follow-up  chemistries   8: Hypertension: monitor TID and prn             -continue metoprolol succinate 100 mg daily             -continue diltiazem 240 mg daily  -increase magnesium to 500mg  HS  -07/20/23 BPs variable but overall improving, monitor Vitals:   07/17/23 0459 07/17/23 0700 07/17/23 1442 07/17/23 1946  BP: (!) 157/78 (!) 146/77 (!) 170/89 (!) 171/85   07/18/23 0525 07/18/23 0805 07/18/23 1236 07/18/23 2033  BP: (!) 148/87 122/69 (!) 155/79 (!) 179/81   07/19/23 0551 07/19/23 1302 07/19/23 1942 07/20/23 0447  BP: (!) 154/81 120/60 (!) 156/83 (!) 142/81    9: Hyperlipidemia: continue statin   10: History of CAD; on aspirin and statin  -continue plavix as well             -follow-up with Dr. Dietrich Pates   11: History lung cancer 2008   12: Asthmatic bronchitis/former smoker, slight exacerbation (at home uses Trelegy/Ventolin)             -continue Breo/Incruse inhalers             -continue mucinex dm which seems to have helped             -prn duoneb, hycodan             -follow-up with Dr. 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 Incentive spirometer ordered.  12: Gait disturbance: recent work-up as outpatient by Dr. Marjory Lies             -to re-schedule LP for findings of ventriculomegaly/NPH   -plavix now long term, so  this will need to be considered in scheduling procedure.  -decrease gabapentin to 200mg  HS  -discussed that Qutenza can help with peripheral neuropathy  14: Chronic HFpEF: check daily weights, messaged nursing to please weigh patient today, weights reviewed and are stable. Discussed that CXR shows no edema.    -07/20/23 wt stable last time it was checked, no edema; monitor Filed Weights   07/15/23 1800 07/16/23 1258 07/17/23 1100  Weight: 92.7 kg 94.4 kg 94.1 kg    15. Hyponatremia: continue to monitor sodium  16. Suboptimal vitamin D: increase D3 to 3,000U daily    LOS: 7 days A FACE TO FACE EVALUATION WAS PERFORMED  [id number] 07/20/2023, 10:25 AM

## 2023-07-21 DIAGNOSIS — I10 Essential (primary) hypertension: Secondary | ICD-10-CM | POA: Diagnosis not present

## 2023-07-21 DIAGNOSIS — I639 Cerebral infarction, unspecified: Secondary | ICD-10-CM | POA: Diagnosis not present

## 2023-07-21 NOTE — Progress Notes (Signed)
Occupational Therapy Discharge Summary  Patient Details  Name: Jeremy Lopez MRN: [id number] Date of Birth: October 16, 1950  Date of Discharge from OT service:July 21, 2023    Patient has met 9 of 9 long term goals due to improved activity tolerance, improved balance, postural control, ability to compensate for deficits, functional use of  RIGHT lower extremity, and improved coordination.  Patient to discharge at overall Modified Independent level.  Patient's care partner is independent to provide the necessary physical assistance at discharge.    Reasons goals not met: NA  Recommendation:  Patient will benefit from ongoing skilled OT services in outpatient setting to continue to advance functional skills in the area of BADL, iADL, and Reduce care partner burden.  Equipment: No equipment provided  Reasons for discharge: treatment goals met and discharge from hospital  Patient/family agrees with progress made and goals achieved: Yes  OT Discharge Precautions/Restrictions  Precautions Precautions: Fall Precaution Comments: R hemi, list right. Restrictions Weight Bearing Restrictions: No   Pain Pain Assessment Pain Score: 0-No pain ADL ADL Eating: Independent Where Assessed-Eating: Edge of bed, Wheelchair Grooming: Independent Where Assessed-Grooming: Standing at sink Upper Body Bathing: Independent Where Assessed-Upper Body Bathing: Shower Lower Body Bathing: Independent Where Assessed-Lower Body Bathing: Shower Upper Body Dressing: Independent Where Assessed-Upper Body Dressing: Chair Lower Body Dressing: Modified independent Where Assessed-Lower Body Dressing: Chair Toileting: Modified independent Where Assessed-Toileting: Neurosurgeon Method: Insurance claims handler: Other (comment) (NA) Tub/Shower Equipment: Information systems manager with back, Acupuncturist: Modified independent Training and development officer Method: Designer, industrial/product: Other (comment) (Can complete without equipment as needed) Vision Baseline Vision/History: 1 Wears glasses Patient Visual Report: No change from baseline Vision Assessment?: No apparent visual deficits Perception  Perception: Within Functional Limits Praxis Praxis: WFL Cognition Cognition Overall Cognitive Status: Within Functional Limits for tasks assessed Arousal/Alertness: Awake/alert Memory: Appears intact Attention: Focused;Sustained;Selective Focused Attention: Appears intact Sustained Attention: Appears intact Selective Attention: Appears intact Awareness: Appears intact Problem Solving: Appears intact Safety/Judgment: Appears intact Brief Interview for Mental Status (BIMS) Repetition of Three Words (First Attempt): 3 Temporal Orientation: Year: Correct Temporal Orientation: Month: Accurate within 5 days Temporal Orientation: Day: Correct Recall: "Sock": Yes, no cue required Recall: "Blue": Yes, no cue required Recall: "Bed": Yes, no cue required BIMS Summary Score: 15 Sensation Sensation Light Touch: Appears Intact Hot/Cold: Appears Intact Proprioception: Appears Intact Stereognosis: Appears Intact Coordination Gross Motor Movements are Fluid and Coordinated: Yes Fine Motor Movements are Fluid and Coordinated: Yes Heel Shin Test: [facility] Motor  Motor Motor: Other (comment);Hemiplegia Motor - Discharge Observations: mild R hemi; generalized deconditioning and weakness Mobility  Bed Mobility Bed Mobility: Supine to Sit;Sit to Supine;Rolling Left;Rolling Right Rolling Right: Independent Rolling Left: Independent Supine to Sit: Independent Sit to Supine: Independent Transfers Sit to Stand: Independent with assistive device Stand to Sit: Independent with assistive device  Trunk/Postural Assessment  Cervical Assessment Cervical Assessment: Within Functional Limits Thoracic Assessment Thoracic Assessment:  Within Functional Limits Lumbar Assessment Lumbar Assessment: Within Functional Limits Postural Control Postural Control: Deficits on evaluation Trunk Control: R trunk lean in standing  Balance Balance Balance Assessed: Yes Static Sitting Balance Static Sitting - Balance Support: Feet supported Static Sitting - Level of Assistance: 7: Independent Dynamic Sitting Balance Dynamic Sitting - Balance Support: Feet supported;During functional activity Dynamic Sitting - Level of Assistance: 7: Independent Static Standing Balance Static Standing - Balance Support: Right upper extremity supported;Left upper extremity supported;During functional activity Static Standing - Level  of Assistance: 6: Modified independent (Device/Increase time) Dynamic Standing Balance Dynamic Standing - Balance Support: Right upper extremity supported;Left upper extremity supported;During functional activity Dynamic Standing - Level of Assistance: 6: Modified independent (Device/Increase time) Extremity/Trunk Assessment RUE Assessment RUE Assessment: Within Functional Limits LUE Assessment LUE Assessment: Within Functional Limits   [name] 07/21/2023, 4:09 PM

## 2023-07-21 NOTE — Progress Notes (Signed)
PROGRESS NOTE   Subjective/Complaints:  Pt doing well, ready to go home. Slept well, denies pain, LBM yesterday per patient, urinating fine. Denies any other complaints or concerns today. Confirmed what meds he's supposed to be taking.    ROS: Patient denies fever, rash, sore throat, blurred vision, dizziness, nausea, vomiting, diarrhea,   shortness of breath or chest pain, joint or back/neck pain, headache, or mood change. +peripheral neuropathy, +congestion   Objective:   No results found. No results for input(s): "WBC", "HGB", "HCT", "PLT" in the last 72 hours.  No results for input(s): "NA", "K", "CL", "CO2", "GLUCOSE", "BUN", "CREATININE", "CALCIUM" in the last 72 hours.   Intake/Output Summary (Last 24 hours) at 07/21/2023 1004 Last data filed at 07/20/2023 2000 Gross per 24 hour  Intake 560 ml  Output --  Net 560 ml        Physical Exam: Vital Signs Blood pressure 126/87, pulse 78, temperature 97.9 F (36.6 C), resp. rate 16, height 5\' 10"  (1.778 m), weight 94.1 kg, SpO2 95%.  Gen: no distress, normal appearing, sitting in chair dressed and ready to go HEENT: oral mucosa pink and moist, NCAT Cardio: Reg rate and rhythm, no m/r/g appreciated Chest: normal effort, normal rate of breathing, CTAB Abd: soft, non-distended, nonTTP, +BS throughout Ext: no edema Psych: pleasant, normal affect  PRIOR EXAMS: Neuro:  Alert and oriented x 3. Normal insight and awareness. Intact Memory. Normal language and speech. Cranial nerve exam unremarkable. MMT: 5/5 LUE and LLE. 4+/5 RUE and RLE. Mild right limb ataxia. Sensory exam normal for light touch and pain in all 4 limbs.  No abnormal tone appreciated. Decreased sensation of feet Musculoskeletal: Full ROM, No pain with AROM or PROM in the neck, trunk, or extremities. Posture appropriate     Assessment/Plan: 1. Functional deficits which require 3+ hours per day of  interdisciplinary therapy in a comprehensive inpatient rehab setting. Physiatrist is providing close team supervision and 24 hour management of active medical problems listed below. Physiatrist and rehab team continue to assess barriers to discharge/monitor patient progress toward functional and medical goals  Care Tool:  Bathing    Body parts bathed by patient: Right arm, Left arm, Chest, Abdomen, Front perineal area, Buttocks, Right upper leg, Left upper leg, Right lower leg, Left lower leg, Face         Bathing assist Assist Level: Independent Assistive Device Comment: no equipment needed   Upper Body Dressing/Undressing Upper body dressing   What is the patient wearing?: Pull over shirt    Upper body assist Assist Level: Independent    Lower Body Dressing/Undressing Lower body dressing      What is the patient wearing?: Underwear/pull up, Pants     Lower body assist Assist for lower body dressing: Independent     Toileting Toileting    Toileting assist Assist for toileting: Independent with assistive device Assistive Device Comment: RW   Transfers Chair/bed transfer  Transfers assist     Chair/bed transfer assist level: Independent with assistive device Chair/bed transfer assistive device: Armrests, Cane   Locomotion Ambulation   Ambulation assist      Assist level: Independent with assistive device Assistive device:  Walker-rolling Max distance: 116ft   Walk 10 feet activity   Assist     Assist level: Independent with assistive device Assistive device: Walker-rolling   Walk 50 feet activity   Assist    Assist level: Independent with assistive device Assistive device: Walker-rolling    Walk 150 feet activity   Assist Walk 150 feet activity did not occur: Safety/medical concerns  Assist level: Independent with assistive device Assistive device: Walker-rolling    Walk 10 feet on uneven surface  activity   Assist     Assist  level: Contact Guard/Touching assist Assistive device: Walker-rolling   Wheelchair     Assist Is the patient using a wheelchair?: No Type of Wheelchair: Manual (during dressing)    Wheelchair assist level: Supervision/Verbal cueing Max wheelchair distance: 50    Wheelchair 50 feet with 2 turns activity    Assist        Assist Level: Supervision/Verbal cueing   Wheelchair 150 feet activity     Assist      Assist Level: Maximal Assistance - Patient 25 - 49%   Blood pressure 126/87, pulse 78, temperature 97.9 F (36.6 C), resp. rate 16, height 5\' 10"  (1.778 m), weight 94.1 kg, SpO2 95%.  Medical Problem List and Plan: 1. Functional deficits secondary to right medial inferior cerebellar infarct.             -hx of gait disorder, ?NPH with shuffling, forward leaning gait but was distinctly different than what he's experienced after this stroke.             -patient may shower             -ELOS/Goals: 10-12 days, mod I goals with PT and OT   -Patient is beginning CIR therapies today including PT and OT   Metanx started  D/c today 07/21/23 2.  Antithrombotics: -DVT/anticoagulation:  Pharmaceutical: Lovenox             -antiplatelet therapy: Aspirin and Plavix for 3 weeks then plavix alone given pt's hx of CAD (cards/neuro on board)--d/c summary dates unclear, verified with pharmacy and reviewed notes, confirmed PLAVIX is monotherapy, ASA needed through 07/27/23 and then plavix alone. Updated d/c summary in writing and handed to patient.    3. Neuropathy: Tylenol as needed             -continue gabapentin 300 mg q HS  Qutenza patches scheduled outpatient   4. Mood/Behavior/Sleep: LCSW to evaluate and provide emotional support             -continue escitalopram 10 mg daily             -continue home trazodone 50 mg q HS--sleeping well             -antipsychotic agents: n/a              5. Neuropsych/cognition: This patient is capable of making decisions on his own  behalf.   6. Skin/Wound Care: Routine skin care checks   7. Fluids/Electrolytes/Nutrition: Routine Is and Os and follow-up chemistries   8: Hypertension: monitor TID and prn             -continue metoprolol succinate 100 mg daily             -continue diltiazem 240 mg daily  -increase magnesium to 500mg  HS  -10/12-13/24 BPs variable but overall improving, monitor Vitals:   07/18/23 0525 07/18/23 0805 07/18/23 1236 07/18/23 2033  BP: (!) 148/87 122/69 Marland Kitchen)  155/79 (!) 179/81   07/19/23 0551 07/19/23 1302 07/19/23 1942 07/20/23 0447  BP: (!) 154/81 120/60 (!) 156/83 (!) 142/81   07/20/23 1601 07/20/23 1607 07/20/23 1943 07/21/23 0533  BP: (!) 171/85 111/63 (!) 147/74 126/87    9: Hyperlipidemia: continue statin   10: History of CAD; on aspirin and statin  -continue plavix as well             -follow-up with Dr. Dietrich Pates   11: History lung cancer 2008   12: Asthmatic bronchitis/former smoker, slight exacerbation (at home uses Trelegy/Ventolin)             -continue Breo/Incruse inhalers             -continue mucinex dm which seems to have helped             -prn duoneb, hycodan             -follow-up with Dr. 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 Incentive spirometer ordered.  12: Gait disturbance: recent work-up as outpatient by Dr. Marjory Lies             -to re-schedule LP for findings of ventriculomegaly/NPH   -plavix now long term, so this will need to be considered in scheduling procedure.  -decrease gabapentin to 200mg  HS  -discussed that Qutenza can help with peripheral neuropathy  14: Chronic HFpEF: check daily weights, messaged nursing to please weigh patient today, weights reviewed and are stable. Discussed that CXR shows no edema.    -07/20/23 wt stable last time it was checked, no edema; monitor Filed Weights   07/15/23 1800 07/16/23 1258 07/17/23 1100  Weight: 92.7 kg 94.4 kg 94.1 kg    15.  Hyponatremia: continue to monitor sodium  16. Suboptimal vitamin D: increase D3 to 3,000U daily  I spent >75mins performing patient care related activities, including face to face time, documentation time, review of notes and meds, discussion of meds with patient and pharmacy staff, and overall coordination of care.   LOS: 8 days A FACE TO FACE EVALUATION WAS PERFORMED  [address] 07/21/2023, 10:04 AM

## 2023-07-23 ENCOUNTER — Encounter (HOSPITAL_COMMUNITY): Payer: HMO

## 2023-07-23 ENCOUNTER — Telehealth: Payer: Self-pay | Admitting: Physical Medicine and Rehabilitation

## 2023-07-23 NOTE — Telephone Encounter (Signed)
Patient and family called asking about PT for patient. I did not see any referrals in his account.

## 2023-07-24 ENCOUNTER — Encounter: Payer: Self-pay | Admitting: Physical Medicine and Rehabilitation

## 2023-07-24 ENCOUNTER — Ambulatory Visit: Payer: HMO | Admitting: Behavioral Health

## 2023-07-24 ENCOUNTER — Encounter: Payer: Self-pay | Admitting: Diagnostic Neuroimaging

## 2023-07-24 ENCOUNTER — Telehealth: Payer: Self-pay

## 2023-07-24 ENCOUNTER — Other Ambulatory Visit: Payer: Self-pay | Admitting: Physical Medicine and Rehabilitation

## 2023-07-24 DIAGNOSIS — I63211 Cerebral infarction due to unspecified occlusion or stenosis of right vertebral arteries: Secondary | ICD-10-CM

## 2023-07-24 NOTE — Telephone Encounter (Signed)
Spoke to patient and verbalized providers recommendations. Pt stated that he has not picked up Irbesartan, but would do so.   Patient stated that it was okay to speak to daughter, [name], regarding providers recommendations.   Left a message for [name] to call our office.

## 2023-07-24 NOTE — Telephone Encounter (Signed)
Jeremy Montana, PA-C routed conversation to [employer]; [name], MD1 hour ago (2:27 PM)   Jeremy Montana, PA-C1 hour ago (2:27 PM)    Will route this message to triage because I do not see Jeremy Lopez specifically listed on our DPR so may need to speak with the patient to ensure OK to relay this information. Please thank them for reaching out to keep [place] in the loop and let them know I am saddened to hear of his hospital stay! At last visit, Jeremy Lopez reported Jeremy Lopez was too expensive so he wished to go onto something cheaper instead so we changed to irbesartan with plan for BP follow-up/labs in a week. However, he was then admitted to the hospital. The admission notes indicate the patient reported to the pharmacy tech he was not taking irbesartan due to patient preference. I'm not certain what that means as I don't see any notes back to our team about any side effects or concerns so would please clarify if he had any problems with this medicine or decided not to take it. He has periodically deferred filling his prescriptions and so we just want to make sure he didn't have a problem with the actual medication - thank you!   His inpatient rehab notes are a little confusing - discharge summary indicated plan to continue Toprol 100mg  + diltiazem 240mg  but the discharge medication list indicated to reduce Toprol to 50mg  daily (new rx was sent in) and stop diltiazem. It looks like these medicines were actually adjusted earlier in his main hospital stay due to low BP. Would recommend getting an updated list of recent HR/BP readings so we can help guide medication recommendations. Would follow the medication list he was given at discharge from the hospital in the interim.   He sees PCP on 10/25; I am comfortable with them managing BP at that visit, otherwise has follow-up with Dr. Tenny Craw in early December. I will cc to Dr. Durwin Nora so he is aware peripherally of the medication trail as well.      Note    Jeremy Bushman, RN routed conversation to [place]; Jeremy Montana, PA-C3 hours ago (12:27 PM)   Jeremy Bushman, RN  Jeremy Gandy Paschal3 hours ago (12:27 PM)    I will send message to provider.     Jeremy Lopez  P Cv Div Nl Triage (supporting Jeremy N Dunn, PA-C)4 hours ago (11:45 AM)    Hi Jeremy Lopez,   Thank you for the response. He had a stroke between the labs you are referring to and now. We are unsure if he needs to stay on the medication post stroke along with new medications. Is there a way to check with a doctor on this?    Thank you!    Jeremy Bushman, RN  Jeremy Ruiz D Paschal4 hours ago (11:19 AM)    Jeremy Lopez, Per lab result not he saw taken off Entresto on  06/28/23. If you have any more questions or concerns please feel free to contact the office.    Labs are stable. Lipids look good. Per our discussion will transition off Entresto onto ARB due to cost. Plan: - Stop Entresto - Start irbesartan 300mg  daily, can start 12 hours after last dose of Entresto, but then remember this is only ONCE daily - Recheck BMET 1 week - Notify if BP begins trending <110 on regular basis in the first days of taking med - Keep BP log and be ready  to relay readings when we tell him his BMET results      Jeremy Lopez  P Cv Div Nl Triage (supporting Jeremy N Dunn, PA-C)5 hours ago (10:20 AM)    Hi Jeremy,    This is Jeremy Lopez's daughter Jeremy Lopez) He is back home from the hospital, and we are trying to get his medications squared away. I know he was taken off some medications and others were added. He is currently taking aspirin and plavix post stroke but we were unsure whether he needs to currently take entresto? No one really gave US guidance and I couldn't find it on his list   Thanks so much! Jeremy Lopez

## 2023-07-24 NOTE — Telephone Encounter (Addendum)
Will route this message to triage because I do not see [name] specifically listed on our DPR so may need to speak with the patient to ensure OK to relay this information. Please thank them for reaching out to keep [place] in the loop and let them know I am saddened to hear of his hospital stay! At last visit, Mr. Lader reported [name] was too expensive so he wished to go onto something cheaper instead so we changed to irbesartan with plan for BP follow-up/labs in a week. However, he was then admitted to the hospital. The admission notes indicate the patient reported to the pharmacy tech he was not taking irbesartan due to patient preference. I'm not certain what that means as I don't see any notes back to our team about any side effects or concerns so would please clarify if he had any problems with this medicine or decided not to take it. He has periodically deferred filling his prescriptions and so we just want to make sure he didn't have a problem with the actual medication - thank you!  His inpatient rehab notes are a little confusing - discharge summary indicated plan to continue Toprol 100mg  + diltiazem 240mg  but the discharge medication list indicated to reduce Toprol to 50mg  daily (new rx was sent in) and stop diltiazem. It looks like these medicines were actually adjusted earlier in his main hospital stay due to low BP. Would recommend getting an updated list of recent HR/BP readings so we can help guide medication recommendations. Would follow the medication list he was given at discharge from the hospital in the interim.  He sees PCP on 10/25; I am comfortable with them managing BP at that visit, otherwise has follow-up with Dr. Tenny Craw in early December. I will cc to Dr. Durwin Nora so he is aware peripherally of the medication trail as well.

## 2023-07-25 ENCOUNTER — Encounter: Payer: Self-pay | Admitting: *Deleted

## 2023-07-25 ENCOUNTER — Encounter (HOSPITAL_COMMUNITY): Payer: HMO

## 2023-07-25 NOTE — Telephone Encounter (Signed)
Left a message for pt's daughter to call office back

## 2023-07-29 ENCOUNTER — Telehealth: Payer: Self-pay | Admitting: Physician Assistant

## 2023-07-29 ENCOUNTER — Encounter: Payer: Self-pay | Admitting: Diagnostic Neuroimaging

## 2023-07-29 NOTE — Telephone Encounter (Signed)
   Pt is calling back, he said, he have some questions about his new medications he got from the hospital

## 2023-07-29 NOTE — Telephone Encounter (Signed)
Spoke to pt who asked to read through his medication list to make sure everything is correct.   BP readings:  10/17- 152/83; 126/84 10/18- 126/80; 140/87 10/19- 152/86; 136/84 10/20- 167/89; 156/83 10/21- 148/80  Pt is questioning if he can proceed with him lumbar puncture for 10/22.   Please advise

## 2023-07-29 NOTE — Discharge Instructions (Signed)

## 2023-07-30 ENCOUNTER — Encounter (HOSPITAL_COMMUNITY): Payer: HMO

## 2023-07-30 ENCOUNTER — Telehealth: Payer: Self-pay

## 2023-07-30 ENCOUNTER — Inpatient Hospital Stay
Admission: RE | Admit: 2023-07-30 | Discharge: 2023-07-30 | Disposition: A | Discharge status: Home / Self Care | Payer: HMO | Source: Ambulatory Visit | Attending: Diagnostic Neuroimaging | Admitting: Diagnostic Neuroimaging

## 2023-07-30 MED ORDER — EMPAGLIFLOZIN 10 MG PO TABS: 10.0000 mg | ORAL_TABLET | Freq: Every day | ORAL | 6 refills | Status: DC

## 2023-07-30 MED ORDER — METOPROLOL SUCCINATE ER 100 MG PO TB24: 100.0000 mg | ORAL_TABLET | Freq: Every day | ORAL | 3 refills | Status: DC

## 2023-07-30 NOTE — Telephone Encounter (Signed)
Noted. Please increase Toprol back to 100mg  daily. He may have this rx at home as this was his previous prescription before recent admission, but will need to update his formal prescription back to this dose. Would also restart empagliflozin 10mg  daily - hopefully should have this at home as this was also previously supposed to be a part of his pre-hospital regimen.  Recommend to move up cardiology office visit. Given recent admission with medication changes, history of medication inconsistency,  and several different medication messages, would be best managed in a formal visit so we can ensure we are giving the best care. Would definitely keep the 12/2 visit with Dr. Tenny Craw but see if we can get in with either APP or pharmD within 1-2 weeks, may need to trial [address] [facility] if no [place] availability - [facility] usually has better availability recently. Can get the follow-up BMET at that time. Bring all med bottles to that appointment. Keep f/u with PCP 10/25. Thank you!

## 2023-07-30 NOTE — Telephone Encounter (Signed)
Attempt to reach, mailbox full,unable to leave message

## 2023-07-30 NOTE — Telephone Encounter (Addendum)
FYI triage so you know the recent chain of events: this is a patient for which there are several recent MyChart messages/phone notes surrounding his medication list. The patient has had prior challenges following the recommended regimen. He was recently in the hospital for stroke. His daughter subsequently reached out to ask about [name] but we clarified we had actually recommended to switch this to irbesartan due to cost before he went into the hospital, but then he reported at time of stroke that he never actually picked it up so the hospital team never started it. The hospital discharged him on Toprol 50mg  daily as his only antihypertensive medicine due to issues with lower blood pressure earlier in his stay. We had advised in previous MyChart message 10/18 for him to follow exactly what was listed on his discharge med list and bring BP log to visit.    Please clarify that these BP readings are on Toprol 50mg  daily without any irbesartan since on 10/16 there was a note where patient said he was planning to pick up the irbesartan but then 10/18 our team relayed to follow the discharge medication list which did not include irbesartan.  If he confirms he remains off irbesartan at this time, would increase Toprol first back to previous maintenance dose of 100mg  daily and keep follow-up as planned with PCP this week to guide potential addition of irbesartan. If he reports that he already just self-restarted irbesartan, would continue for now, follow up with PCP as schedued, keep Toprol at current 50mg  daily, and recheck BMET in 1 week. Also has f/u with Dr. Tenny Craw on 12/2.  Re: lumbar puncture, neurology already recommended he avoid this for at least 3 months due to recent stroke so would follow their recommendation.

## 2023-07-30 NOTE — Telephone Encounter (Signed)
I had to leave a message on his phone. I asked him to increase Toprol to 100 mg daily and to re-start Jardiance 10 mg daily.I have e-scribed the prescriptions to [facility] pharmacy.   I also asked him if he would consider going to [facility] to be seen before his December appointment scheduled here in [place]. I await his response.

## 2023-07-30 NOTE — Telephone Encounter (Signed)
Call to patient to advise of no LP at this time per Dr. Marjory Lies, no answer, left detailed message and mychart message sent. Returned call to daughter, made aware she wasn't on DPR but I was calling her dad and responding to message. She was appreciative of call.

## 2023-07-30 NOTE — Telephone Encounter (Signed)
Sent patient MyChart message.

## 2023-07-30 NOTE — Telephone Encounter (Signed)
I spoke with patient.he confirmed he is taking toprol 50 mg every day and irbesartan 300 mg daily since 07/24/23   He said BP today is 170/100    Please advise   Pt prefers message thru MyChart

## 2023-07-31 NOTE — Telephone Encounter (Signed)
Pt notified. Pt stated that he will reach out to the [facility] office and make a sooner f/u appointment.

## 2023-07-31 NOTE — Therapy (Signed)
OUTPATIENT PHYSICAL THERAPY NEURO EVALUATION   Patient Name: PARRISH SPOMER MRN: [id number] DOB:Apr 21, 1951, 72 y.o., male Today's Date: 08/01/2023   PCP: Delmar Landau, MD REFERRING PROVIDER: Horton Chin, MD  END OF SESSION:  PT End of Session - 08/01/23 0847     Visit Number 1    Number of Visits 12    Date for PT Re-Evaluation 09/12/23    Authorization Type [employer]    PT Start Time 0847    PT Stop Time 0930    PT Time Calculation (min) 43 min    Equipment Utilized During Treatment Gait belt    Activity Tolerance Patient tolerated treatment well    Behavior During Therapy [facility] for tasks assessed/performed             Past Medical History:  Diagnosis Date   CAD (coronary artery disease), native coronary artery    DES proximal LAD 07/2009; DES to RCA 04/2020   Chronic heart failure with preserved ejection fraction (HFpEF) (HCC)    Essential hypertension    Habitual alcohol use    Hyperlipidemia    PVC's (premature ventricular contractions)    Small cell lung cancer (HCC)    Right upper lobe 2008; no evidence of disease since treatment ended in 11/2007   Past Surgical History:  Procedure Laterality Date   CORONARY ATHERECTOMY N/A 04/26/2020   Procedure: CORONARY ATHERECTOMY;  Surgeon: Corky Crafts, MD;  Location: [facility] INVASIVE CV LAB;  Service: Cardiovascular;  Laterality: N/A;   CORONARY STENT INTERVENTION N/A 04/26/2020   Procedure: CORONARY STENT INTERVENTION;  Surgeon: Corky Crafts, MD;  Location: [facility] INVASIVE CV LAB;  Service: Cardiovascular;  Laterality: N/A;   CORONARY ULTRASOUND/IVUS N/A 04/26/2020   Procedure: Intravascular Ultrasound/IVUS;  Surgeon: Corky Crafts, MD;  Location: [facility] INVASIVE CV LAB;  Service: Cardiovascular;  Laterality: N/A;   Insertion of left subclavian Port-A-Cath  2008   LEFT HEART CATH AND CORONARY ANGIOGRAPHY N/A 07/03/2022   Procedure: LEFT HEART CATH AND CORONARY ANGIOGRAPHY;  Surgeon: [place], Peter  M, MD;  Location: [facility] INVASIVE CV LAB;  Service: Cardiovascular;  Laterality: N/A;   RIGHT/LEFT HEART CATH AND CORONARY ANGIOGRAPHY N/A 04/26/2020   Procedure: RIGHT/LEFT HEART CATH AND CORONARY ANGIOGRAPHY;  Surgeon: Corky Crafts, MD;  Location: [facility] INVASIVE CV LAB;  Service: Cardiovascular;  Laterality: N/A;   TEMPORARY PACEMAKER N/A 04/26/2020   Procedure: TEMPORARY PACEMAKER;  Surgeon: Corky Crafts, MD;  Location: [facility] INVASIVE CV LAB;  Service: Cardiovascular;  Laterality: N/A;   Video bronchoscopy and video mediastinoscopy  2008   Patient Active Problem List   Diagnosis Date Noted   Acute CVA (cerebrovascular accident) (HCC) 07/13/2023   Dizziness 07/12/2023   Cerebellar stroke, acute (HCC) 07/07/2023   CVA (cerebral vascular accident) (HCC) 07/05/2023   Headache 07/05/2023   Prediabetes 02/01/2023   CKD (chronic kidney disease) stage 2, GFR 60-89 ml/min 02/01/2023   Nausea 02/01/2023   Colon cancer screening 02/01/2023   CHF (congestive heart failure) (HCC) 01/23/2023   Numbness and tingling of both lower extremities 01/23/2023   Encounter for general adult medical examination with abnormal findings 01/23/2023   Allergic rhinitis 01/23/2022   Asthmatic bronchitis, mild persistent, uncomplicated 01/23/2022   Upper airway cough syndrome 01/23/2022   Simple chronic bronchitis (HCC) 12/28/2021   Seasonal allergies 12/28/2021   DOE (dyspnea on exertion)    Nosebleed 10/25/2014   Lung cancer (HCC) 02/05/2010   CAD (coronary artery disease), native coronary artery 02/05/2010   Mixed  hyperlipidemia 08/16/2009   Essential hypertension 07/08/2009    ONSET DATE: 07/05/23  REFERRING DIAG: Y30.160 (ICD-10-CM) - Cerebrovascular accident (CVA) due to stenosis of right vertebral artery (HCC)  THERAPY DIAG:  Cerebrovascular accident (CVA) due to stenosis of right vertebral artery (HCC) - Plan: PT plan of care cert/re-cert  Difficulty in walking, not elsewhere classified -  Plan: PT plan of care cert/re-cert  Other abnormalities of gait and mobility - Plan: PT plan of care cert/re-cert  Rationale for Evaluation and Treatment: Rehabilitation  SUBJECTIVE:                                                                                                                                                                                             SUBJECTIVE STATEMENT: Woke up nauseated and weak on right side 07/05/23; went to hospital later that day.  After hospitalization was in inpatient rehab 7 days; d/c home 10/13; has been doing his HEP at home.  Walks in today without AD; "having a bad day today"; did not sleep well last night.  Generally walking is the most challenging thing for him at home. Right side weakness; left hand dominant Pt accompanied by: significant other wife [name]  PERTINENT HISTORY: lung cancer 2008  PAIN:  Are you having pain? No  PRECAUTIONS: Fall  WEIGHT BEARING RESTRICTIONS: No  FALLS: Has patient fallen in last 6 months? No  LIVING ENVIRONMENT: Lives with: lives with their spouse Lives in: House/apartment Stairs: Yes: Internal: 13 steps; on right going up, on left going up, and can reach both and External: 1 steps; none Has following equipment at home: Single point cane, Walker - 4 wheeled, and shower chair  PLOF: Independent  PATIENT GOALS: get back to normal  OBJECTIVE:  Note: Objective measures were completed at Evaluation unless otherwise noted.  DIAGNOSTIC FINDINGS:   COGNITION: Overall cognitive status: Within functional limits for tasks assessed   SENSATION: WFL  COORDINATION: Mild decreased right side versus left   MUSCLE TONE: appears wnl  DTRs:  Not tested at eval  POSTURE: rounded shoulders and forward head  LOWER EXTREMITY ROM:     Active  Right Eval Left Eval  Hip flexion    Hip extension    Hip abduction    Hip adduction    Hip internal rotation    Hip external rotation    Knee flexion     Knee extension    Ankle dorsiflexion    Ankle plantarflexion    Ankle inversion    Ankle eversion     (Blank rows = not tested)  LOWER EXTREMITY MMT:    MMT Right  Eval Left Eval  Hip flexion 4+ 5  Hip extension 4 4+  Hip abduction    Hip adduction    Hip internal rotation    Hip external rotation    Knee flexion 4+ 4+  Knee extension 5 5  Ankle dorsiflexion 4+ 5  Ankle plantarflexion    Ankle inversion    Ankle eversion    (Blank rows = not tested)  BED MOBILITY:  Sit to supine Modified independence Supine to sit Modified independence  TRANSFERS: Assistive device utilized: None  Sit to stand: SBA Stand to sit: SBA Chair to chair: SBA Floor: not tested  STAIRS: Level of Assistance: CGA Stair Negotiation Technique: Alternating Pattern  with Single Rail on Right Number of Stairs: 8  Height of Stairs: 7"  Comments: slow descent  GAIT: Gait pattern: decreased arm swing- Right, decreased arm swing- Left, decreased step length- Right, decreased stance time- Right, and shuffling Distance walked: 100 ft in clinic Assistive device utilized: None Level of assistance: CGA Comments: ocassional misstep and path deviation but no loss of balance noted  FUNCTIONAL TESTS:  5 times sit to stand: 17.21 sec hands on thighs  Dynamic Gait Index: 13/24 SLS unable   DGI 1. Gait level surface (2) Mild Impairment: Walks 20', uses assistive devices, slower speed, mild gait deviations. 2. Change in gait speed (2) Mild Impairment: Is able to change speed but demonstrates mild gait deviations, or not gait deviations but unable to achieve a significant change in velocity, or uses an assistive device. 3. Gait with horizontal head turns (1) Moderate Impairment: Performs head turns with moderate change in gait velocity, slows down, staggers but recovers, can continue to walk. 4. Gait with vertical head turns 1) Moderate Impairment: Performs head turns with moderate change in gait  velocity, slows down, staggers but recovers, can continue to walk. 5. Gait and pivot turn (1) Moderate Impairment: Turns slowly, requires verbal cueing, requires several small steps to catch balance following turn and stop. 6. Step over obstacle (2) Mild Impairment: Is able to step over box, but must slow down and adjust steps to clear box safely. 7. Step around obstacles (2) Mild Impairment: Is able to step around both cones, but must slow down and adjust steps to clear cones. 8. Stairs (2) Mild Impairment: Alternating feet, must use rail.  TOTAL SCORE: 13 / 24  PATIENT SURVEYS:  LEFS 46/80 57.5%  TODAY'S TREATMENT:                                                                                                                              DATE: 08/01/2023 physical therapy evaluation and HEP instruction    PATIENT EDUCATION: Education details: Patient educated on exam findings, POC, scope of PT, HEP, and what to expect next visit. Person educated: Patient Education method: Explanation, Demonstration, and Handouts Education comprehension: verbalized understanding, returned demonstration, verbal cues required, and tactile cues required  HOME EXERCISE PROGRAM: Access Code: [email or web address]  URL: [email or web address] Date: 08/01/2023 Prepared by: [facility] - Rehab  Exercises - Sit to Stand  - 2 x daily - 7 x weekly - 1 sets - 10 reps - Standing Single Leg Stance with Counter Support  - 2 x daily - 7 x weekly - 1 sets - 5 reps - 10 sec hold  GOALS: Goals reviewed with patient? No  SHORT TERM GOALS: Target date: 08/22/2023  patient will be independent with initial HEP Baseline: Goal status: INITIAL  2.  Patient will self report 50% improvement to improve tolerance for functional activity  Baseline:  Goal status: INITIAL  LONG TERM GOALS: Target date: 09/12/2023  Patient will be independent in self management strategies to improve quality of life and functional  outcomes.  Baseline:  Goal status: INITIAL  2.  Patient will self report 75% improvement to improve tolerance for functional activity  Baseline:  Goal status: INITIAL  3.  Patient will increase  leg MMTs to 5/5 without pain to promote return to ambulation community distances with minimal deviation.  Baseline: see above Goal status: INITIAL  4.  Patient will improve DGI score by 4 points to demonstrate improved functional balance with gait Baseline: 13/24 Goal status: INITIAL  5.  Patient will be able to stand SLS x 5" each leg to demonstrate improved functional balance Baseline: 0" Goal status: INITIAL  6.  Patient will be able to walk community distances safely with LRAD modified independently Baseline: needs CGA for safety level surfaces Goal status: INITIAL  ASSESSMENT:  CLINICAL IMPRESSION: Patient is a 72 y.o. male who was seen today for physical therapy evaluation and treatment for I63.211 (ICD-10-CM) - Cerebrovascular accident (CVA) due to stenosis of right vertebral artery (HCC).  Patient demonstrates decreased strength, balance deficits and gait abnormalities which are negatively impacting patient ability to perform ADLs and functional mobility tasks. Patient will benefit from skilled physical therapy services to address these deficits to improve level of function with ADLs, functional mobility tasks, and reduce risk for falls.    OBJECTIVE IMPAIRMENTS: Abnormal gait, decreased activity tolerance, decreased balance, decreased knowledge of use of DME, decreased mobility, difficulty walking, decreased strength, decreased safety awareness, and impaired perceived functional ability.   ACTIVITY LIMITATIONS: carrying, lifting, bending, standing, squatting, stairs, transfers, and locomotion level  PARTICIPATION LIMITATIONS: meal prep, cleaning, driving, shopping, community activity, and yard work  [facility] POTENTIAL: Good  CLINICAL DECISION MAKING: Evolving/moderate  complexity  EVALUATION COMPLEXITY: Moderate  PLAN:  PT FREQUENCY: 2x/week  PT DURATION: 6 weeks  PLANNED INTERVENTIONS: 97164- PT Re-evaluation, 97110-Therapeutic exercises, 97530- Therapeutic activity, 97112- Neuromuscular re-education, 97535- Self Care, 16109- Manual therapy, [id number]- Gait training, [phone]- Orthotic Fit/training, [phone]- Canalith repositioning, [id number]- Aquatic Therapy, [phone]- Splinting, Patient/Family education, Balance training, Stair training, Taping, Dry Needling, Joint mobilization, Joint manipulation, Spinal manipulation, Spinal mobilization, Scar mobilization, and DME instructions.   PLAN FOR NEXT SESSION: Review HEP and goals; lower extremity strengthening; gait and balance; encouraged patient to use walker for safety   9:56 AM, 08/01/23 Marvie Calender Small Denzil Mceachron MPT [facility] [place] [phone] Ph:[phone]

## 2023-08-01 ENCOUNTER — Telehealth: Payer: Self-pay | Admitting: Diagnostic Neuroimaging

## 2023-08-01 ENCOUNTER — Ambulatory Visit (HOSPITAL_COMMUNITY): Payer: HMO | Attending: Physical Medicine and Rehabilitation

## 2023-08-01 ENCOUNTER — Other Ambulatory Visit: Payer: Self-pay

## 2023-08-01 ENCOUNTER — Encounter (HOSPITAL_COMMUNITY): Payer: HMO

## 2023-08-01 DIAGNOSIS — I63211 Cerebral infarction due to unspecified occlusion or stenosis of right vertebral arteries: Secondary | ICD-10-CM | POA: Diagnosis not present

## 2023-08-01 DIAGNOSIS — R262 Difficulty in walking, not elsewhere classified: Secondary | ICD-10-CM | POA: Diagnosis not present

## 2023-08-01 DIAGNOSIS — I639 Cerebral infarction, unspecified: Secondary | ICD-10-CM | POA: Diagnosis not present

## 2023-08-01 DIAGNOSIS — R200 Anesthesia of skin: Secondary | ICD-10-CM | POA: Insufficient documentation

## 2023-08-01 DIAGNOSIS — R2689 Other abnormalities of gait and mobility: Secondary | ICD-10-CM | POA: Diagnosis not present

## 2023-08-01 NOTE — Telephone Encounter (Signed)
LVM and sent MyChart msg informing pt of r/s needed for 11/27 appt- MD out.

## 2023-08-02 ENCOUNTER — Encounter: Payer: Self-pay | Admitting: Internal Medicine

## 2023-08-02 ENCOUNTER — Ambulatory Visit (INDEPENDENT_AMBULATORY_CARE_PROVIDER_SITE_OTHER): Payer: HMO | Admitting: Internal Medicine

## 2023-08-02 VITALS — BP 130/82 | HR 72 | Ht 70.0 in | Wt 204.2 lb

## 2023-08-02 DIAGNOSIS — E782 Mixed hyperlipidemia: Secondary | ICD-10-CM | POA: Diagnosis not present

## 2023-08-02 DIAGNOSIS — I639 Cerebral infarction, unspecified: Secondary | ICD-10-CM

## 2023-08-02 DIAGNOSIS — I10 Essential (primary) hypertension: Secondary | ICD-10-CM | POA: Diagnosis not present

## 2023-08-02 MED ORDER — CLOPIDOGREL BISULFATE 75 MG PO TABS: 75.0000 mg | ORAL_TABLET | Freq: Every day | ORAL | 1 refills | Status: DC

## 2023-08-02 NOTE — Patient Instructions (Signed)
It was a pleasure to see you today.  Thank you for giving [place] the opportunity to be involved in your care.  Below is a brief recap of your visit and next steps.  We will plan to see you again in 3 months.  Summary Start irbesartan 150 mg daily We will follow up in 3 months

## 2023-08-02 NOTE — Plan of Care (Signed)
 [facility] Tonsillectomy/Adenoidectomy, Postoperative PEDS care plan entered in error.

## 2023-08-02 NOTE — Assessment & Plan Note (Signed)
Lipid panel updated during recent hospital admission.  Total cholesterol 98 and LDL 34.  He is currently prescribed rosuvastatin 40 mg daily.  No changes are indicated today.

## 2023-08-02 NOTE — Assessment & Plan Note (Addendum)
He would like to review his current antihypertensive medication regimen today.  Prior to hospital admission, he was in the process of transitioning from [facility] to irbesartan but did not start irbesartan.  Diltiazem was discontinued during hospital admission due to hypotension.  He was discharged on Toprol-XL 50 mg daily.  This has since been increased to 100 mg daily.  BP today was elevated initially and improved to 130/82. -Through shared decision making, he will start irbesartan 150 mg daily.  Continue Toprol-XL 100 mg daily.  Cardiology follow-up is scheduled for 10/28 for further medication titration.

## 2023-08-02 NOTE — Progress Notes (Signed)
Established Patient Office Visit  Subjective   Patient ID: Jeremy Lopez, male    DOB: August 21, 1951  Age: 72 y.o. MRN: [id number]  Chief Complaint  Patient presents with   stroke    Follow up    Jeremy Lopez returns to care today for follow-up.  Previously evaluated by me on 4/26.  Gabapentin 300 mg nightly was trialed for lower extremity paresthesias.  He was also referred to cardiac rehab at his request.  In the interim, he presented to [facility] ED on 9/27 endorsing disequilibrium and right-sided weakness.  Found to have acute ischemic cerebellar CVA.  Hospital admission 9/27 - 10/5.  Discharged to CIR 10/5 - 10/13.  He has been in communication with cardiology regarding his medication regimen.  Jeremy Lopez reports feeling fairly well today.  He continues to experience difficulties with balance, but considers himself lucky in light of recent events.  He would like to review his current medication regimen but otherwise has no acute concerns to discuss.  Past Medical History:  Diagnosis Date   CAD (coronary artery disease), native coronary artery    DES proximal LAD 07/2009; DES to RCA 04/2020   Chronic heart failure with preserved ejection fraction (HFpEF) (HCC)    Essential hypertension    Habitual alcohol use    Hyperlipidemia    PVC's (premature ventricular contractions)    Small cell lung cancer (HCC)    Right upper lobe 2008; no evidence of disease since treatment ended in 11/2007   Past Surgical History:  Procedure Laterality Date   CORONARY ATHERECTOMY N/A 04/26/2020   Procedure: CORONARY ATHERECTOMY;  Surgeon: Corky Crafts, MD;  Location: [facility] INVASIVE CV LAB;  Service: Cardiovascular;  Laterality: N/A;   CORONARY STENT INTERVENTION N/A 04/26/2020   Procedure: CORONARY STENT INTERVENTION;  Surgeon: Corky Crafts, MD;  Location: [facility] INVASIVE CV LAB;  Service: Cardiovascular;  Laterality: N/A;   CORONARY ULTRASOUND/IVUS N/A 04/26/2020   Procedure: Intravascular  Ultrasound/IVUS;  Surgeon: Corky Crafts, MD;  Location: [facility] INVASIVE CV LAB;  Service: Cardiovascular;  Laterality: N/A;   Insertion of left subclavian Port-A-Cath  2008   LEFT HEART CATH AND CORONARY ANGIOGRAPHY N/A 07/03/2022   Procedure: LEFT HEART CATH AND CORONARY ANGIOGRAPHY;  Surgeon: [place]il[place], Peter M, MD;  Location: [facility] INVASIVE CV LAB;  Service: Cardiovascular;  Laterality: N/A;   RIGHT/LEFT HEART CATH AND CORONARY ANGIOGRAPHY N/A 04/26/2020   Procedure: RIGHT/LEFT HEART CATH AND CORONARY ANGIOGRAPHY;  Surgeon: Corky Crafts, MD;  Location: [facility] INVASIVE CV LAB;  Service: Cardiovascular;  Laterality: N/A;   TEMPORARY PACEMAKER N/A 04/26/2020   Procedure: TEMPORARY PACEMAKER;  Surgeon: Corky Crafts, MD;  Location: [facility] INVASIVE CV LAB;  Service: Cardiovascular;  Laterality: N/A;   Video bronchoscopy and video mediastinoscopy  2008   Social History   Tobacco Use   Smoking status: Former    Current packs/day: 0.00    Types: Cigarettes    Start date: 10/25/1966    Quit date: 10/25/1998    Years since quitting: 24.7   Smokeless tobacco: Never  Vaping Use   Vaping status: Never Used  Substance Use Topics   Alcohol use: Yes    Alcohol/week: 14.0 standard drinks of alcohol    Types: 14 Cans of beer per week    Comment: 1 drink per day   Drug use: Yes    Types: Marijuana   Family History  Problem Relation Age of Onset   Cancer Father    CAD Brother  Hypertension Brother    Depression Maternal Uncle    Alcohol abuse Maternal Uncle    Allergies  Allergen Reactions   Cephalexin Anaphylaxis    throat swelling Patient said he can take amoxicillin   Amlodipine Swelling    Leg swelling   Review of Systems  Neurological:        Balance difficulties  All other systems reviewed and are negative.    Objective:     BP 130/82   Pulse 72   Ht 5\' 10"  (1.778 m)   Wt 204 lb 3.2 oz (92.6 kg)   SpO2 95%   BMI 29.30 kg/m  BP Readings from Last 3 Encounters:   08/02/23 130/82  07/21/23 126/87  07/13/23 139/68   Physical Exam Vitals reviewed.  Constitutional:      General: He is not in acute distress.    Appearance: Normal appearance. He is obese. He is not ill-appearing.  HENT:     Head: Normocephalic and atraumatic.     Right Ear: External ear normal.     Left Ear: External ear normal.     Nose: Nose normal. No congestion or rhinorrhea.     Mouth/Throat:     Mouth: Mucous membranes are moist.     Pharynx: Oropharynx is clear.  Eyes:     General: No scleral icterus.    Extraocular Movements: Extraocular movements intact.     Conjunctiva/sclera: Conjunctivae normal.     Pupils: Pupils are equal, round, and reactive to light.  Cardiovascular:     Rate and Rhythm: Normal rate and regular rhythm.     Pulses: Normal pulses.     Heart sounds: Normal heart sounds. No murmur heard. Pulmonary:     Effort: Pulmonary effort is normal.     Breath sounds: Normal breath sounds. No wheezing, rhonchi or rales.  Abdominal:     General: Abdomen is flat. Bowel sounds are normal. There is no distension.     Palpations: Abdomen is soft.     Tenderness: There is no abdominal tenderness.  Musculoskeletal:        General: No swelling or deformity. Normal range of motion.     Cervical back: Normal range of motion.  Skin:    General: Skin is warm and dry.     Capillary Refill: Capillary refill takes less than 2 seconds.  Neurological:     General: No focal deficit present.     Mental Status: He is alert and oriented to person, place, and time.     Cranial Nerves: No cranial nerve deficit.     Sensory: No sensory deficit.     Motor: No weakness.     Coordination: Coordination abnormal.     Gait: Gait abnormal.  Psychiatric:        Mood and Affect: Mood normal.        Behavior: Behavior normal.        Thought Content: Thought content normal.   Last CBC Lab Results  Component Value Date   WBC 6.5 07/15/2023   HGB 13.2 07/15/2023   HCT 39.6  07/15/2023   MCV 92.7 07/15/2023   MCH 30.9 07/15/2023   RDW 14.2 07/15/2023   PLT 356 07/15/2023   Last metabolic panel Lab Results  Component Value Date   GLUCOSE 78 07/15/2023   NA 133 (L) 07/15/2023   K 3.6 07/15/2023   CL 99 07/15/2023   CO2 25 07/15/2023   BUN 20 07/15/2023   CREATININE 1.19 07/15/2023  GFRNONAA >60 07/15/2023   CALCIUM 8.3 (L) 07/15/2023   PROT 6.0 (L) 07/06/2023   ALBUMIN 3.3 (L) 07/06/2023   LABGLOB 2.5 01/18/2023   AGRATIO 1.8 01/18/2023   BILITOT 0.6 07/06/2023   ALKPHOS 61 07/06/2023   AST 11 (L) 07/06/2023   ALT 13 07/06/2023   ANIONGAP 9 07/15/2023   Last lipids Lab Results  Component Value Date   CHOL 98 07/06/2023   HDL 42 07/06/2023   LDLCALC 34 07/06/2023   TRIG 109 07/06/2023   CHOLHDL 2.3 07/06/2023   Last hemoglobin A1c Lab Results  Component Value Date   HGBA1C 5.7 (H) 07/05/2023   Last thyroid functions Lab Results  Component Value Date   TSH 2.590 01/18/2023   Last vitamin D Lab Results  Component Value Date   VD25OH 40.7 01/18/2023   Last vitamin B12 and Folate Lab Results  Component Value Date   VITAMINB12 261 01/18/2023   FOLATE 13.2 01/18/2023     Assessment & Plan:   Problem List Items Addressed This Visit       Essential hypertension    He would like to review his current antihypertensive medication regimen today.  Prior to hospital admission, he was in the process of transitioning from [facility] to irbesartan but did not start irbesartan.  Diltiazem was discontinued during hospital admission due to hypotension.  He was discharged on Toprol-XL 50 mg daily.  This has since been increased to 100 mg daily.  BP today was elevated initially and improved to 130/82. -Through shared decision making, he will start irbesartan 150 mg daily.  Continue Toprol-XL 100 mg daily.  Cardiology follow-up is scheduled for 10/28 for further medication titration.      Cerebellar stroke, acute (HCC) - Primary    As  documented above, Jeremy Lopez had an acute cerebellar CVA in late September.  Discharged from CIR on 10/13.  He has completed DAPT x 21 days and is currently on Plavix monotherapy.  He is taking rosuvastatin as prescribed.  He will start physical therapy on Tuesday (10/29).  Cardiology follow-up scheduled for 10/28,PM&R appointment scheduled for 11/14.  Neurology follow-up is currently scheduled for February 2025. -Continue Plavix and statin therapy      Mixed hyperlipidemia    Lipid panel updated during recent hospital admission.  Total cholesterol 98 and LDL 34.  He is currently prescribed rosuvastatin 40 mg daily.  No changes are indicated today.      Return in about 3 months (around 11/02/2023).   [name], MD

## 2023-08-02 NOTE — Assessment & Plan Note (Signed)
As documented above, Mr. Mrotek had an acute cerebellar CVA in late September.  Discharged from CIR on 10/13.  He has completed DAPT x 21 days and is currently on Plavix monotherapy.  He is taking rosuvastatin as prescribed.  He will start physical therapy on Tuesday (10/29).  Cardiology follow-up scheduled for 10/28,PM&R appointment scheduled for 11/14.  Neurology follow-up is currently scheduled for February 2025. -Continue Plavix and statin therapy

## 2023-08-05 ENCOUNTER — Telehealth: Payer: Self-pay

## 2023-08-05 ENCOUNTER — Ambulatory Visit: Payer: HMO | Attending: Nurse Practitioner | Admitting: Nurse Practitioner

## 2023-08-05 ENCOUNTER — Encounter: Payer: Self-pay | Admitting: Nurse Practitioner

## 2023-08-05 VITALS — BP 124/68 | HR 65 | Ht 70.0 in | Wt 206.0 lb

## 2023-08-05 DIAGNOSIS — I10 Essential (primary) hypertension: Secondary | ICD-10-CM | POA: Diagnosis not present

## 2023-08-05 DIAGNOSIS — I2510 Atherosclerotic heart disease of native coronary artery without angina pectoris: Secondary | ICD-10-CM | POA: Diagnosis not present

## 2023-08-05 DIAGNOSIS — Z79899 Other long term (current) drug therapy: Secondary | ICD-10-CM

## 2023-08-05 DIAGNOSIS — E785 Hyperlipidemia, unspecified: Secondary | ICD-10-CM | POA: Diagnosis not present

## 2023-08-05 DIAGNOSIS — I639 Cerebral infarction, unspecified: Secondary | ICD-10-CM

## 2023-08-05 DIAGNOSIS — I5032 Chronic diastolic (congestive) heart failure: Secondary | ICD-10-CM

## 2023-08-05 MED ORDER — OMRON 3 SERIES BP MONITOR DEVI: 1 refills | Status: AC

## 2023-08-05 NOTE — Progress Notes (Signed)
Cardiology Office Note:  .   Date: 08/05/2023  ID:  Jeremy Lopez, DOB [birth year], MRN [id number] PCP: [name], MD  [facility] Providers Cardiologist:  Dietrich Pates, MD    History of Present Illness: .   Jeremy Lopez is a 72 y.o. male with a PMH of CAD, hyperlipidemia, hypertension, former tobacco user, leg edema, PVCs, CKD stage II, suspected HFpEF, past history of lung cancer and status post radiation/chemo, EtOH abuse, COPD/radiation fibrosis, (follows pulmonology), acute CVA, who presents today for scheduled follow-up.   Previous CV history includes DES to proximal LAD in 2010, the following year he received a drug-eluting stent to the mid RCA.  CTA previously revealed radiation fibrosis, history of DOE.  Echo 9/23 revealed EF 60 to 65%, grade 1 DD.  Cardiac authorization at that time revealed nonobstructive CAD in RCA and patent stents, mildly elevated LVEDP.  Was recommended to switch diltiazem to [place] and add Jardiance.   Saw Ronie Spies, PA-C in August 2024, blood pressure was elevated out of being out of his medications for several days.  Was undergoing neurology workup for gait issues, mild parkinsonian is him noted at recent neuro visit.  MRI revealed ventriculomegaly and NPH on the differential.  Last saw Maryruth Hancock on June 24, 2023.  Denied any cardiac complaints or issues.  Was back on his BP medications.  BP improved with plans for close follow-up of lab work.  Hospital admission October 2024 for acute CVA.  MRI of the brain without contrast with acute versus early subacute infarct medial inferior right cerebellum.  Evaluated by neurology with recommendation for aspirin and Plavix x 3 weeks, then followed by aspirin alone.  Admitted to rehab for inpatient therapies.  Diltiazem 240 mg was discontinued on acute side due to orthostatic hypotension.  He was scheduled for follow-up with Dr. Tenny Craw in December but due to recent hospitalization, concerns  regarding medication management, and patient MyChart messages with staff, it was felt best to bring him back for sooner follow-up.  Today he presents for scheduled follow-up.  He states his PCP recently made adjustments to his medicines, confirms he is taking Irbesartan 150 mg daily, as well as Toprol XL 100 mg daily. Says he just restarted taking Crestor today, was off this medication before. States he is not taking Aspirin and only taking Plavix and appears there is some confusion regarding what he has been told regarding his Aspirin and Plavix. Overall he is doing well from a cardiac perspective. Denies any chest pain, shortness of breath, palpitations, syncope, presyncope, dizziness, orthopnea, PND, swelling or significant weight changes, acute bleeding, or claudication.   ROS: Negative. See HPI.   Studies Reviewed: .    Echo 06/2023:  1. Left ventricular ejection fraction, by estimation, is 60 to 65%. The  left ventricle has normal function. The left ventricle has no regional  wall motion abnormalities. There is mild left ventricular hypertrophy.  Left ventricular diastolic parameters are consistent with Grade I diastolic dysfunction (impaired relaxation).   2. Right ventricular systolic function is normal. The right ventricular  size is normal. Tricuspid regurgitation signal is inadequate for assessing PA pressure.   3. The mitral valve is normal in structure. No evidence of mitral valve regurgitation.   4. The aortic valve is tricuspid. Aortic valve regurgitation is not  visualized. Aortic valve sclerosis is present, with no evidence of aortic valve stenosis.   5. The inferior vena cava is normal in size with greater  than 50%  respiratory variability, suggesting right atrial pressure of 3 mmHg.  LHC 06/2022:    Mid RCA to Dist RCA lesion is 40% stenosed.   Mid RCA lesion is 30% stenosed.   Non-stenotic Prox LAD lesion was previously treated.   LV end diastolic pressure is mildly  elevated.   Nonobstructive CAD. Prior stents in the LAD and RCA are patent Mildly elevated LVEDP 17 mm Hg   Plan: medical management.   Right/Left heart cath 04/2020:  Mid RCA lesion is 99% stenosed, heavily calcified. Following atherectomy, A drug-eluting stent was successfully placed using a STENT RESOLUTE ONYX 3.5X15, postdilated to 3.75 mm; stent optimized with IVUS. Post intervention, there is a 0% residual stenosis. Mid RCA to Dist RCA lesion is 40% stenosed. Previously placed Prox LAD stent, is widely patent. The left ventricular systolic function is normal. LV end diastolic pressure is normal. The left ventricular ejection fraction is 55-65% by visual estimate. There is no aortic valve stenosis.   Continue aggressive secondary prevention.  Still eligible for same-day discharge.   Consider Plavix monotherapy given his diffuse disease after DAPT is completed.  Lexiscan 04/2020:  Nuclear stress EF: 68%. There was no ST segment deviation noted during stress. No T wave inversion was noted during stress. The study is normal. This is a low risk study. The left ventricular ejection fraction is hyperdynamic (>65%).   1. Reduced counts in the inferior wall that improve on stress imaging with normal wall motion consistent with diaphragm attenuation.  2. No ischemia or infarction.  3. Normal LVEF, >65%.  4. Average exercise capacity (5:23 min:s; 7 METS). 5. Normal HR/BP response to exercise.  6. This is a low-risk study.  Physical Exam:   VS:  BP 124/68   Pulse 65   Ht 5\' 10"  (1.778 m)   Wt 206 lb (93.4 kg)   SpO2 97%   BMI 29.56 kg/m    Wt Readings from Last 3 Encounters:  08/05/23 206 lb (93.4 kg)  08/02/23 204 lb 3.2 oz (92.6 kg)  07/17/23 207 lb 6.4 oz (94.1 kg)    GEN: Well nourished, well developed in no acute distress NECK: No JVD; No carotid bruits CARDIAC: S1/S2, RRR, no murmurs, rubs, gallops RESPIRATORY:  Clear to auscultation without rales, wheezing or  rhonchi  EXTREMITIES:  No edema; No deformity   ASSESSMENT AND PLAN: .    HFpEF Stage C, NYHA class I symptoms. EF 60-65%, Grade 1 DD 06/2023. Euvolemic and well compensated on exam. Continue Jardiance, Irbesartan, and Toprol XL. Low sodium diet, fluid restriction <2L, and daily weights encouraged. Educated to contact our office for weight gain of 2 lbs overnight or 5 lbs in one week. Will obtain BMET in 1 week.  CAD, HLD, medication management Stable with no anginal symptoms. No indication for ischemic evaluation. Will clarify Aspirin and Plavix with Neurology - see below. Continue rest of medication regimen. Just restarted rosuvastatin and tolerating well. Will recheck FLP/LFT in 2 months per protocol. Heart healthy diet and regular cardiovascular exercise encouraged.   HTN BP stable. Discussed to monitor BP at home at least 2 hours after medications and sitting for 5-10 minutes. No medication changes at this time. Will obtain BMET as mentioned above. Heart healthy diet and regular cardiovascular exercise encouraged. Will provide him with BP cuff to monitor BP at home.   Acute CVA Will clarify Aspirin and Plavix with Neurology as there seems to be confusion. Patient at this current time remains on Plavix  monotherapy. Denies any issues. Continue to follow-up with therapy and with Neurology and PCP.   Dispo: Follow-up with me/APP in 3-4 months or sooner if anything changes.   Signed, Sharlene Dory, NP

## 2023-08-05 NOTE — Patient Instructions (Signed)
Medication Instructions:   Continue all current medications.   Labwork:  BMET - order given  Please do in 1 week  FLP, LFT - Reminder:  Nothing to eat or drink after 12 midnight prior to labs - please do in 2 months  Office will contact with results via phone, letter or mychart.    Testing/Procedures:  none  Follow-Up:  3-4 months   Any Other Special Instructions Will Be Listed Below (If Applicable).   If you need a refill on your cardiac medications before your next appointment, please call your pharmacy.

## 2023-08-06 ENCOUNTER — Encounter (HOSPITAL_COMMUNITY): Payer: HMO

## 2023-08-06 ENCOUNTER — Ambulatory Visit (HOSPITAL_COMMUNITY): Payer: HMO | Admitting: Physical Therapy

## 2023-08-06 DIAGNOSIS — I63211 Cerebral infarction due to unspecified occlusion or stenosis of right vertebral arteries: Secondary | ICD-10-CM

## 2023-08-06 DIAGNOSIS — R262 Difficulty in walking, not elsewhere classified: Secondary | ICD-10-CM

## 2023-08-06 DIAGNOSIS — R2689 Other abnormalities of gait and mobility: Secondary | ICD-10-CM

## 2023-08-06 NOTE — Therapy (Signed)
OUTPATIENT PHYSICAL THERAPY TREATMENT   Patient Name: Jeremy Lopez MRN: [id number] DOB:1951-08-01, 72 y.o., male Today's Date: 08/06/2023   PCP: Jeremy Landau, MD REFERRING PROVIDER: Horton Chin, MD  END OF SESSION:  PT End of Session - 08/06/23 1309     Visit Number 2    Number of Visits 12    Date for PT Re-Evaluation 09/12/23    Authorization Type [employer]    PT Start Time 1307    PT Stop Time 1352    PT Time Calculation (min) 45 min    Equipment Utilized During Treatment Gait belt    Activity Tolerance Patient tolerated treatment well    Behavior During Therapy WFL for tasks assessed/performed             Past Medical History:  Diagnosis Date   CAD (coronary artery disease), native coronary artery    DES proximal LAD 07/2009; DES to RCA 04/2020   Chronic heart failure with preserved ejection fraction (HFpEF) (HCC)    Essential hypertension    Habitual alcohol use    Hyperlipidemia    PVC's (premature ventricular contractions)    Small cell lung cancer (HCC)    Right upper lobe 2008; no evidence of disease since treatment ended in 11/2007   Past Surgical History:  Procedure Laterality Date   CORONARY ATHERECTOMY N/A 04/26/2020   Procedure: CORONARY ATHERECTOMY;  Surgeon: Jeremy Crafts, MD;  Location: [facility] INVASIVE CV LAB;  Service: Cardiovascular;  Laterality: N/A;   CORONARY STENT INTERVENTION N/A 04/26/2020   Procedure: CORONARY STENT INTERVENTION;  Surgeon: Jeremy Crafts, MD;  Location: [facility] INVASIVE CV LAB;  Service: Cardiovascular;  Laterality: N/A;   CORONARY ULTRASOUND/IVUS N/A 04/26/2020   Procedure: Intravascular Ultrasound/IVUS;  Surgeon: Jeremy Crafts, MD;  Location: [facility] INVASIVE CV LAB;  Service: Cardiovascular;  Laterality: N/A;   Insertion of left subclavian Port-A-Cath  2008   LEFT HEART CATH AND CORONARY ANGIOGRAPHY N/A 07/03/2022   Procedure: LEFT HEART CATH AND CORONARY ANGIOGRAPHY;  Surgeon: [place], Peter M, MD;   Location: [facility] INVASIVE CV LAB;  Service: Cardiovascular;  Laterality: N/A;   RIGHT/LEFT HEART CATH AND CORONARY ANGIOGRAPHY N/A 04/26/2020   Procedure: RIGHT/LEFT HEART CATH AND CORONARY ANGIOGRAPHY;  Surgeon: Jeremy Crafts, MD;  Location: [facility] INVASIVE CV LAB;  Service: Cardiovascular;  Laterality: N/A;   TEMPORARY PACEMAKER N/A 04/26/2020   Procedure: TEMPORARY PACEMAKER;  Surgeon: Jeremy Crafts, MD;  Location: [facility] INVASIVE CV LAB;  Service: Cardiovascular;  Laterality: N/A;   Video bronchoscopy and video mediastinoscopy  2008   Patient Active Problem List   Diagnosis Date Noted   Acute CVA (cerebrovascular accident) (HCC) 07/13/2023   Dizziness 07/12/2023   Cerebellar stroke, acute (HCC) 07/07/2023   CVA (cerebral vascular accident) (HCC) 07/05/2023   Headache 07/05/2023   Prediabetes 02/01/2023   CKD (chronic kidney disease) stage 2, GFR 60-89 ml/min 02/01/2023   Nausea 02/01/2023   Colon cancer screening 02/01/2023   CHF (congestive heart failure) (HCC) 01/23/2023   Numbness and tingling of both lower extremities 01/23/2023   Encounter for general adult medical examination with abnormal findings 01/23/2023   Allergic rhinitis 01/23/2022   Asthmatic bronchitis, mild persistent, uncomplicated 01/23/2022   Upper airway cough syndrome 01/23/2022   Simple chronic bronchitis (HCC) 12/28/2021   Seasonal allergies 12/28/2021   DOE (dyspnea on exertion)    Nosebleed 10/25/2014   Lung cancer (HCC) 02/05/2010   CAD (coronary artery disease), native coronary artery 02/05/2010   Mixed hyperlipidemia  08/16/2009   Essential hypertension 07/08/2009    ONSET DATE: 07/05/23  REFERRING DIAG: Z61.096 (ICD-10-CM) - Cerebrovascular accident (CVA) due to stenosis of right vertebral artery (HCC)  THERAPY DIAG:  Cerebrovascular accident (CVA) due to stenosis of right vertebral artery (HCC)  Difficulty in walking, not elsewhere classified  Other abnormalities of gait and  mobility  Rationale for Evaluation and Treatment: Rehabilitation  SUBJECTIVE:                                                                                                                                                                                             SUBJECTIVE STATEMENT: Pt states he is doing well today and doing the exercises given to him for HEP.  Reports he is only using the walker outdoors.  Comes today without AD   Woke up nauseated and weak on right side 07/05/23; went to hospital later that day.  After hospitalization was in inpatient rehab 7 days; d/c home 10/13; has been doing his HEP at home.  Walks in today without AD; "having a bad day today"; did not sleep well last night.  Generally walking is the most challenging thing for him at home. Right side weakness; left hand dominant Pt accompanied by: significant other wife Jeremy Lopez  PERTINENT HISTORY: lung cancer 2008  PAIN:  Are you having pain? No  PRECAUTIONS: Fall  WEIGHT BEARING RESTRICTIONS: No  FALLS: Has patient fallen in last 6 months? No  LIVING ENVIRONMENT: Lives with: lives with their spouse Lives in: House/apartment Stairs: Yes: Internal: 13 steps; on right going up, on left going up, and can reach both and External: 1 steps; none Has following equipment at home: Single point cane, Walker - 4 wheeled, and shower chair  PLOF: Independent  PATIENT GOALS: get back to normal  OBJECTIVE:  Note: Objective measures were completed at Evaluation unless otherwise noted.  DIAGNOSTIC FINDINGS:   COGNITION: Overall cognitive status: Within functional limits for tasks assessed   SENSATION: WFL  COORDINATION: Mild decreased right side versus left   MUSCLE TONE: appears wnl  DTRs:  Not tested at eval  POSTURE: rounded shoulders and forward head  LOWER EXTREMITY ROM:     Active  Right Eval Left Eval  Hip flexion    Hip extension    Hip abduction    Hip adduction    Hip internal rotation     Hip external rotation    Knee flexion    Knee extension    Ankle dorsiflexion    Ankle plantarflexion    Ankle inversion    Ankle eversion     (Blank rows = not  tested)  LOWER EXTREMITY MMT:    MMT Right Eval Left Eval  Hip flexion 4+ 5  Hip extension 4 4+  Hip abduction    Hip adduction    Hip internal rotation    Hip external rotation    Knee flexion 4+ 4+  Knee extension 5 5  Ankle dorsiflexion 4+ 5  Ankle plantarflexion    Ankle inversion    Ankle eversion    (Blank rows = not tested)  BED MOBILITY:  Sit to supine Modified independence Supine to sit Modified independence  TRANSFERS: Assistive device utilized: None  Sit to stand: SBA Stand to sit: SBA Chair to chair: SBA Floor: not tested  STAIRS: Level of Assistance: CGA Stair Negotiation Technique: Alternating Pattern  with Single Rail on Right Number of Stairs: 8  Height of Stairs: 7"  Comments: slow descent  GAIT: Gait pattern: decreased arm swing- Right, decreased arm swing- Left, decreased step length- Right, decreased stance time- Right, and shuffling Distance walked: 100 ft in clinic Assistive device utilized: None Level of assistance: CGA Comments: ocassional misstep and path deviation but no loss of balance noted  FUNCTIONAL TESTS:  5 times sit to stand: 17.21 sec hands on thighs  Dynamic Gait Index: 13/24 SLS unable   DGI 1. Gait level surface (2) Mild Impairment: Walks 20', uses assistive devices, slower speed, mild gait deviations. 2. Change in gait speed (2) Mild Impairment: Is able to change speed but demonstrates mild gait deviations, or not gait deviations but unable to achieve a significant change in velocity, or uses an assistive device. 3. Gait with horizontal head turns (1) Moderate Impairment: Performs head turns with moderate change in gait velocity, slows down, staggers but recovers, can continue to walk. 4. Gait with vertical head turns 1) Moderate Impairment: Performs  head turns with moderate change in gait velocity, slows down, staggers but recovers, can continue to walk. 5. Gait and pivot turn (1) Moderate Impairment: Turns slowly, requires verbal cueing, requires several small steps to catch balance following turn and stop. 6. Step over obstacle (2) Mild Impairment: Is able to step over box, but must slow down and adjust steps to clear box safely. 7. Step around obstacles (2) Mild Impairment: Is able to step around both cones, but must slow down and adjust steps to clear cones. 8. Stairs (2) Mild Impairment: Alternating feet, must use rail.  TOTAL SCORE: 13 / 24  PATIENT SURVEYS:  LEFS 46/80 57.5%  TODAY'S TREATMENT:                                                                                                                              DATE:  08/06/23 Standing:  heelraises  Toeraises  Hip abduction 10X  Hip extension 10X  SLS Rt: 6"  Lt: 3"  Tandem stance 30" X3 each LE lead  Vectors 10X3" each LE with 1 UE assist  Forward step ups 7" with 1 HHA 10X each  Lateral step ups 7" with heel tap bil UE assist 10X each  Reciprocal stairwell with 1-2 UE assist 5RT Sit to stands 10X no UE  Bike seat 14 6 minutes level 4 at EOS  08/01/2023 physical therapy evaluation and HEP instruction    PATIENT EDUCATION: Education details: Patient educated on exam findings, POC, scope of PT, HEP, and what to expect next visit. Person educated: Patient Education method: Explanation, Demonstration, and Handouts Education comprehension: verbalized understanding, returned demonstration, verbal cues required, and tactile cues required  HOME EXERCISE PROGRAM: Access Code: [email or web address] URL: [email or web address] Date: 08/01/2023 Prepared by: AP - Rehab  Exercises - Sit to Stand  - 2 x daily - 7 x weekly - 1 sets - 10 reps - Standing Single Leg Stance with Counter Support  - 2 x daily - 7 x weekly - 1 sets - 5 reps - 10 sec hold  GOALS: Goals  reviewed with patient? No  SHORT TERM GOALS: Target date: 08/22/2023  patient will be independent with initial HEP Baseline: Goal status: INITIAL  2.  Patient will self report 50% improvement to improve tolerance for functional activity  Baseline:  Goal status: INITIAL  LONG TERM GOALS: Target date: 09/12/2023  Patient will be independent in self management strategies to improve quality of life and functional outcomes.  Baseline:  Goal status: INITIAL  2.  Patient will self report 75% improvement to improve tolerance for functional activity  Baseline:  Goal status: INITIAL  3.  Patient will increase  leg MMTs to 5/5 without pain to promote return to ambulation community distances with minimal deviation.  Baseline: see above Goal status: INITIAL  4.  Patient will improve DGI score by 4 points to demonstrate improved functional balance with gait Baseline: 13/24 Goal status: INITIAL  5.  Patient will be able to stand SLS x 5" each leg to demonstrate improved functional balance Baseline: 0" Goal status: INITIAL  6.  Patient will be able to walk community distances safely with LRAD modified independently Baseline: needs CGA for safety level surfaces Goal status: INITIAL  ASSESSMENT:  CLINICAL IMPRESSION: Reviewed exercises given for HEP with inability to maintain SLS greater than 3-6 seconds each LE without UE assist to regain stability.  Added vectors to help improve his LE stability as well as additional hip and LE strengthening activities.  PT with minimal rest breaks, however did note LE musculature fatigue at times with instructions to rest and recover.  Worked on gait quality encouraging heel to toe as tends to shuffle LE in decreased stride.  Ended session on bike to work on activity tolerance.  Overall completed all activities with good form and minimal fatigue.  Pt will continue to benefit from skilled physical therapy services to address his deficits to improve level  of function with ADLs, functional mobility tasks, and reduce risk for falls.    OBJECTIVE IMPAIRMENTS: Abnormal gait, decreased activity tolerance, decreased balance, decreased knowledge of use of DME, decreased mobility, difficulty walking, decreased strength, decreased safety awareness, and impaired perceived functional ability.   ACTIVITY LIMITATIONS: carrying, lifting, bending, standing, squatting, stairs, transfers, and locomotion level  PARTICIPATION LIMITATIONS: meal prep, cleaning, driving, shopping, community activity, and yard work  [facility] POTENTIAL: Good  CLINICAL DECISION MAKING: Evolving/moderate complexity  EVALUATION COMPLEXITY: Moderate  PLAN:  PT FREQUENCY: 2x/week  PT DURATION: 6 weeks  PLANNED INTERVENTIONS: 97164- PT Re-evaluation, 97110-Therapeutic exercises, 97530- Therapeutic activity, [id number]- Neuromuscular re-education, 97535- Self Care, 95284- Manual therapy, [id number]- Gait training, [id number]- Orthotic Fit/training, [id number]-  Canalith repositioning, 78295- Aquatic Therapy, [phone]- Splinting, Patient/Family education, Balance training, Stair training, Taping, Dry Needling, Joint mobilization, Joint manipulation, Spinal manipulation, Spinal mobilization, Scar mobilization, and DME instructions.   PLAN FOR NEXT SESSION: continue to increase lower extremity strengthening; gait and balance.continue to encourage patient to use walker for safety   1:58 PM, 08/06/23 Lurena Nida, PTA/CLT [facility] Ph: [phone]

## 2023-08-08 ENCOUNTER — Ambulatory Visit (HOSPITAL_COMMUNITY): Payer: HMO

## 2023-08-08 ENCOUNTER — Encounter (HOSPITAL_COMMUNITY): Payer: HMO

## 2023-08-08 DIAGNOSIS — R262 Difficulty in walking, not elsewhere classified: Secondary | ICD-10-CM

## 2023-08-08 DIAGNOSIS — I63211 Cerebral infarction due to unspecified occlusion or stenosis of right vertebral arteries: Secondary | ICD-10-CM

## 2023-08-08 DIAGNOSIS — R2689 Other abnormalities of gait and mobility: Secondary | ICD-10-CM

## 2023-08-08 DIAGNOSIS — I639 Cerebral infarction, unspecified: Secondary | ICD-10-CM

## 2023-08-08 DIAGNOSIS — R200 Anesthesia of skin: Secondary | ICD-10-CM

## 2023-08-08 NOTE — Therapy (Signed)
OUTPATIENT PHYSICAL THERAPY TREATMENT   Patient Name: Jeremy Lopez MRN: [id number] DOB:09/22/1951, 72 y.o., male Today's Date: 08/08/2023   PCP: Jeremy Landau, MD REFERRING PROVIDER: Horton Chin, MD  END OF SESSION:  PT End of Session - 08/08/23 1144     Visit Number 3    Number of Visits 12    Date for PT Re-Evaluation 09/12/23    Authorization Type [employer]    PT Start Time 1144    PT Stop Time 1224    PT Time Calculation (min) 40 min    Equipment Utilized During Treatment Gait belt    Activity Tolerance Patient tolerated treatment well    Behavior During Therapy WFL for tasks assessed/performed             Past Medical History:  Diagnosis Date   CAD (coronary artery disease), native coronary artery    DES proximal LAD 07/2009; DES to RCA 04/2020   Chronic heart failure with preserved ejection fraction (HFpEF) (HCC)    Essential hypertension    Habitual alcohol use    Hyperlipidemia    PVC's (premature ventricular contractions)    Small cell lung cancer (HCC)    Right upper lobe 2008; no evidence of disease since treatment ended in 11/2007   Past Surgical History:  Procedure Laterality Date   CORONARY ATHERECTOMY N/A 04/26/2020   Procedure: CORONARY ATHERECTOMY;  Surgeon: Corky Crafts, MD;  Location: [facility] INVASIVE CV LAB;  Service: Cardiovascular;  Laterality: N/A;   CORONARY STENT INTERVENTION N/A 04/26/2020   Procedure: CORONARY STENT INTERVENTION;  Surgeon: Corky Crafts, MD;  Location: [facility] INVASIVE CV LAB;  Service: Cardiovascular;  Laterality: N/A;   CORONARY ULTRASOUND/IVUS N/A 04/26/2020   Procedure: Intravascular Ultrasound/IVUS;  Surgeon: Corky Crafts, MD;  Location: [facility] INVASIVE CV LAB;  Service: Cardiovascular;  Laterality: N/A;   Insertion of left subclavian Port-A-Cath  2008   LEFT HEART CATH AND CORONARY ANGIOGRAPHY N/A 07/03/2022   Procedure: LEFT HEART CATH AND CORONARY ANGIOGRAPHY;  Surgeon: Swaz[place], Peter M, MD;   Location: [facility] INVASIVE CV LAB;  Service: Cardiovascular;  Laterality: N/A;   RIGHT/LEFT HEART CATH AND CORONARY ANGIOGRAPHY N/A 04/26/2020   Procedure: RIGHT/LEFT HEART CATH AND CORONARY ANGIOGRAPHY;  Surgeon: Corky Crafts, MD;  Location: [facility] INVASIVE CV LAB;  Service: Cardiovascular;  Laterality: N/A;   TEMPORARY PACEMAKER N/A 04/26/2020   Procedure: TEMPORARY PACEMAKER;  Surgeon: Corky Crafts, MD;  Location: [facility] INVASIVE CV LAB;  Service: Cardiovascular;  Laterality: N/A;   Video bronchoscopy and video mediastinoscopy  2008   Patient Active Problem List   Diagnosis Date Noted   Acute CVA (cerebrovascular accident) (HCC) 07/13/2023   Dizziness 07/12/2023   Cerebellar stroke, acute (HCC) 07/07/2023   CVA (cerebral vascular accident) (HCC) 07/05/2023   Headache 07/05/2023   Prediabetes 02/01/2023   CKD (chronic kidney disease) stage 2, GFR 60-89 ml/min 02/01/2023   Nausea 02/01/2023   Colon cancer screening 02/01/2023   CHF (congestive heart failure) (HCC) 01/23/2023   Numbness and tingling of both lower extremities 01/23/2023   Encounter for general adult medical examination with abnormal findings 01/23/2023   Allergic rhinitis 01/23/2022   Asthmatic bronchitis, mild persistent, uncomplicated 01/23/2022   Upper airway cough syndrome 01/23/2022   Simple chronic bronchitis (HCC) 12/28/2021   Seasonal allergies 12/28/2021   DOE (dyspnea on exertion)    Nosebleed 10/25/2014   Lung cancer (HCC) 02/05/2010   CAD (coronary artery disease), native coronary artery 02/05/2010   Mixed hyperlipidemia  08/16/2009   Essential hypertension 07/08/2009    ONSET DATE: 07/05/23  REFERRING DIAG: E95.284 (ICD-10-CM) - Cerebrovascular accident (CVA) due to stenosis of right vertebral artery (HCC)  THERAPY DIAG:  Cerebrovascular accident (CVA) due to stenosis of right vertebral artery (HCC)  Difficulty in walking, not elsewhere classified  Other abnormalities of gait and  mobility  Bilateral leg numbness  Acute CVA (cerebrovascular accident) (HCC)  Rationale for Evaluation and Treatment: Rehabilitation  SUBJECTIVE:                                                                                                                                                                                             SUBJECTIVE STATEMENT: Pt states he is doing well today and doing the exercises given to him for HEP.  Presents to the clinic today without AD. Reports he is only using the walker outdoors. Reports feeling much better with stairs and navigating the house.    Woke up nauseated and weak on right side 07/05/23; went to hospital later that day.  After hospitalization was in inpatient rehab 7 days; d/c home 10/13; has been doing his HEP at home.  Walks in today without AD; "having a bad day today"; did not sleep well last night.  Generally walking is the most challenging thing for him at home. Right side weakness; left hand dominant Pt accompanied by: significant other wife Jeremy Lopez  PERTINENT HISTORY: lung cancer 2008  PAIN:  Are you having pain? No  PRECAUTIONS: Fall  WEIGHT BEARING RESTRICTIONS: No  FALLS: Has patient fallen in last 6 months? No  LIVING ENVIRONMENT: Lives with: lives with their spouse Lives in: House/apartment Stairs: Yes: Internal: 13 steps; on right going up, on left going up, and can reach both and External: 1 steps; none Has following equipment at home: Single point cane, Walker - 4 wheeled, and shower chair  PLOF: Independent  PATIENT GOALS: get back to normal  OBJECTIVE:  Note: Objective measures were completed at Evaluation unless otherwise noted.  DIAGNOSTIC FINDINGS:   COGNITION: Overall cognitive status: Within functional limits for tasks assessed   SENSATION: WFL  COORDINATION: Mild decreased right side versus left   MUSCLE TONE: appears wnl  DTRs:  Not tested at eval  POSTURE: rounded shoulders and forward  head  LOWER EXTREMITY ROM:     Active  Right Eval Left Eval  Hip flexion    Hip extension    Hip abduction    Hip adduction    Hip internal rotation    Hip external rotation    Knee flexion    Knee extension    Ankle dorsiflexion  Ankle plantarflexion    Ankle inversion    Ankle eversion     (Blank rows = not tested)  LOWER EXTREMITY MMT:    MMT Right Eval Left Eval  Hip flexion 4+ 5  Hip extension 4 4+  Hip abduction    Hip adduction    Hip internal rotation    Hip external rotation    Knee flexion 4+ 4+  Knee extension 5 5  Ankle dorsiflexion 4+ 5  Ankle plantarflexion    Ankle inversion    Ankle eversion    (Blank rows = not tested)  BED MOBILITY:  Sit to supine Modified independence Supine to sit Modified independence  TRANSFERS: Assistive device utilized: None  Sit to stand: SBA Stand to sit: SBA Chair to chair: SBA Floor: not tested  STAIRS: Level of Assistance: CGA Stair Negotiation Technique: Alternating Pattern  with Single Rail on Right Number of Stairs: 8  Height of Stairs: 7"  Comments: slow descent  GAIT: Gait pattern: decreased arm swing- Right, decreased arm swing- Left, decreased step length- Right, decreased stance time- Right, and shuffling Distance walked: 100 ft in clinic Assistive device utilized: None Level of assistance: CGA Comments: ocassional misstep and path deviation but no loss of balance noted  FUNCTIONAL TESTS:  5 times sit to stand: 17.21 sec hands on thighs  Dynamic Gait Index: 13/24 SLS unable   DGI 1. Gait level surface (2) Mild Impairment: Walks 20', uses assistive devices, slower speed, mild gait deviations. 2. Change in gait speed (2) Mild Impairment: Is able to change speed but demonstrates mild gait deviations, or not gait deviations but unable to achieve a significant change in velocity, or uses an assistive device. 3. Gait with horizontal head turns (1) Moderate Impairment: Performs head turns with  moderate change in gait velocity, slows down, staggers but recovers, can continue to walk. 4. Gait with vertical head turns 1) Moderate Impairment: Performs head turns with moderate change in gait velocity, slows down, staggers but recovers, can continue to walk. 5. Gait and pivot turn (1) Moderate Impairment: Turns slowly, requires verbal cueing, requires several small steps to catch balance following turn and stop. 6. Step over obstacle (2) Mild Impairment: Is able to step over box, but must slow down and adjust steps to clear box safely. 7. Step around obstacles (2) Mild Impairment: Is able to step around both cones, but must slow down and adjust steps to clear cones. 8. Stairs (2) Mild Impairment: Alternating feet, must use rail.  TOTAL SCORE: 13 / 24  PATIENT SURVEYS:  LEFS 46/80 57.5%  TODAY'S TREATMENT:                                                                                                                              DATE:  08/08/23 Nustep warm up; seat 10, level 3; 10 minutes Standing:   Heel raises  Toe raises  Tandem stance 30" X3 each LE lead; cuing for  50% weight distribution on both feet Forward step ups 7" with 1 HHA x 15 each Lateral step ups 7" with 1 HHA x 15 each Diagonal stepping x 15 each direction  Cane trail gait training  08/06/23 Standing:  heelraises  Toeraises  Hip abduction 10X  Hip extension 10X  SLS Rt: 6"  Lt: 3"  Tandem stance 30" X3 each LE lead  Vectors 10X3" each LE with 1 UE assist  Forward step ups 7" with 1 HHA 10X each  Lateral step ups 7" with heel tap bil UE assist 10X each  Reciprocal stairwell with 1-2 UE assist 5RT Sit to stands 10X no UE  Bike seat 14 6 minutes level 4 at EOS  08/01/2023 physical therapy evaluation and HEP instruction    PATIENT EDUCATION: Education details: Patient educated on exam findings, POC, scope of PT, HEP, and what to expect next visit. Person educated: Patient Education method:  Explanation, Demonstration, and Handouts Education comprehension: verbalized understanding, returned demonstration, verbal cues required, and tactile cues required  HOME EXERCISE PROGRAM: Access Code: [email or web address] URL: [email or web address] Date: 08/01/2023 Prepared by: [facility] - Re[facility]  Exercises - Sit to Stand  - 2 x daily - 7 x weekly - 1 sets - 10 reps - Standing Single Leg Stance with Counter Support  - 2 x daily - 7 x weekly - 1 sets - 5 reps - 10 sec hold  Access Code: [email or web address] URL: [email or web address] Date: 08/08/2023 Prepared by: [facility] - Re[facility]  Exercises - Standing March with Counter Support  - 1 x daily - 7 x weekly - 3 sets - 10 reps - Standing Tandem Balance with Counter Support  - 1 x daily - 7 x weekly - 3 sets - 10 reps - Narrow Stance with Counter Support  - 1 x daily - 7 x weekly - 3 sets - 10 reps - Standing Single Leg Stance with Counter Support  - 1 x daily - 7 x weekly - 3 sets - 10 reps - Heel Raises with Counter Support  - 1 x daily - 7 x weekly - 2 sets - 15 reps - Mini Squat with Counter Support  - 1 x daily - 7 x weekly - 3 sets - 10 reps - Sit to Stand with Counter Support  - 1 x daily - 7 x weekly - 3 sets - 10 reps - Side Stepping with Resistance at Ankles and Counter Support  - 1 x daily - 7 x weekly - 3 sets - 10 reps - Seated Hip Abduction with Resistance  - 1 x daily - 7 x weekly - 3 sets - 10 reps - Clam with Resistance  - 1 x daily - 7 x weekly - 3 sets - 10 reps - Bridge with Hip Abduction and Resistance  - 1 x daily - 7 x weekly - 3 sets - 10 reps  GOALS: Goals reviewed with patient? No  SHORT TERM GOALS: Target date: 08/22/2023  patient will be independent with initial HEP Baseline: Goal status: INITIAL  2.  Patient will self report 50% improvement to improve tolerance for functional activity  Baseline:  Goal status: INITIAL  LONG TERM GOALS: Target date: 09/12/2023  Patient will be independent in self management  strategies to improve quality of life and functional outcomes.  Baseline:  Goal status: INITIAL  2.  Patient will self report 75% improvement to improve tolerance for functional activity  Baseline:  Goal status: INITIAL  3.  Patient will increase  leg MMTs to 5/5 without pain to promote return to ambulation community distances with minimal deviation.  Baseline: see above Goal status: INITIAL  4.  Patient will improve DGI score by 4 points to demonstrate improved functional balance with gait Baseline: 13/24 Goal status: INITIAL  5.  Patient will be able to stand SLS x 5" each leg to demonstrate improved functional balance Baseline: 0" Goal status: INITIAL  6.  Patient will be able to walk community distances safely with LRAD modified independently Baseline: needs CGA for safety level surfaces Goal status: INITIAL  ASSESSMENT:  CLINICAL IMPRESSION: Session started with a review of HEP and organization for ease of completion. Discussed forward and backward weight shifting prior to tandem stance balance to feel proper weight distribution. Patient was able to correct following cues and replicate independently on second set. Patient was able to hold conversation throughout today's session while exercising. Occasionally needed rests between exercises. Introduction of diagonal stepping for fall recovery and balance maintenance. Patient showed quick understanding and independence during a trial of gait training with a SPC. Occasional coordination loss of contralateral arm/leg movement, but was able to stop safely and start reciprocal movement again.   Pt will continue to benefit from skilled physical therapy services to address his deficits to improve level of function with ADLs, functional mobility tasks, and reduce risk for falls.    OBJECTIVE IMPAIRMENTS: Abnormal gait, decreased activity tolerance, decreased balance, decreased knowledge of use of DME, decreased mobility, difficulty  walking, decreased strength, decreased safety awareness, and impaired perceived functional ability.   ACTIVITY LIMITATIONS: carrying, lifting, bending, standing, squatting, stairs, transfers, and locomotion level  PARTICIPATION LIMITATIONS: meal prep, cleaning, driving, shopping, community activity, and yard work  [facility] POTENTIAL: Good  CLINICAL DECISION MAKING: Evolving/moderate complexity  EVALUATION COMPLEXITY: Moderate  PLAN:  PT FREQUENCY: 2x/week  PT DURATION: 6 weeks  PLANNED INTERVENTIONS: 97164- PT Re-evaluation, 97110-Therapeutic exercises, 97530- Therapeutic activity, 97112- Neuromuscular re-education, 97535- Self Care, [id number]- Manual therapy, [id number]- Gait training, [phone]- Orthotic Fit/training, [phone]- Canalith repositioning, [id number]- Aquatic Therapy, [phone]- Splinting, Patient/Family education, Balance training, Stair training, Taping, Dry Needling, Joint mobilization, Joint manipulation, Spinal manipulation, Spinal mobilization, Scar mobilization, and DME instructions.   PLAN FOR NEXT SESSION: continue to increase lower extremity strengthening; gait and balance. Continue to encourage patient to progress to [facility] for out of home use.    11:45 AM, 08/08/23 Yasmina [place]ico Small Eleanora Guinyard MPT [facility] [place] [phone]

## 2023-08-15 ENCOUNTER — Ambulatory Visit (HOSPITAL_COMMUNITY): Payer: HMO | Attending: Physical Medicine and Rehabilitation | Admitting: Physical Therapy

## 2023-08-15 DIAGNOSIS — R200 Anesthesia of skin: Secondary | ICD-10-CM | POA: Insufficient documentation

## 2023-08-15 DIAGNOSIS — R2689 Other abnormalities of gait and mobility: Secondary | ICD-10-CM | POA: Diagnosis not present

## 2023-08-15 DIAGNOSIS — I63211 Cerebral infarction due to unspecified occlusion or stenosis of right vertebral arteries: Secondary | ICD-10-CM | POA: Diagnosis not present

## 2023-08-15 DIAGNOSIS — I639 Cerebral infarction, unspecified: Secondary | ICD-10-CM | POA: Diagnosis not present

## 2023-08-15 DIAGNOSIS — R262 Difficulty in walking, not elsewhere classified: Secondary | ICD-10-CM | POA: Diagnosis not present

## 2023-08-15 NOTE — Telephone Encounter (Signed)
I will route this msg to Sharlene Dory, NP who saw the patient more recently than I did in clinic (though I'm familiar with him from previous messages), just in case something pertinent came up in person at the follow-up visit regarding med adherence. BP was normal at that visit. Lanora Manis - please see below, ?increase irbesartan to 300mg  daily +/- add diltiazem back (though BP was normal at recent visit 10/28). If titrating irbesartan, would probably get BMET in 1 week with close follow-up. When you relay your recommendation would probably ask nursing staff o move this to a new phone note because it is challenging to track the chronicity of change if this is buried in an October MyChart msg. Let me know if I can help any further.

## 2023-08-15 NOTE — Therapy (Signed)
OUTPATIENT PHYSICAL THERAPY TREATMENT   Patient Name: Jeremy Lopez MRN: [id number] DOB:1951/02/15, 72 y.o., male Today's Date: 08/15/2023   PCP: Delmar Landau, MD REFERRING PROVIDER: Horton Chin, MD  END OF SESSION:  PT End of Session - 08/15/23 1155     Visit Number 4    Number of Visits 12    Date for PT Re-Evaluation 09/12/23    Authorization Type [employer]    PT Start Time 1145    PT Stop Time 1210    PT Time Calculation (min) 25 min    Equipment Utilized During Treatment Gait belt    Activity Tolerance Patient tolerated treatment well    Behavior During Therapy WFL for tasks assessed/performed             Past Medical History:  Diagnosis Date   CAD (coronary artery disease), native coronary artery    DES proximal LAD 07/2009; DES to RCA 04/2020   Chronic heart failure with preserved ejection fraction (HFpEF) (HCC)    Essential hypertension    Habitual alcohol use    Hyperlipidemia    PVC's (premature ventricular contractions)    Small cell lung cancer (HCC)    Right upper lobe 2008; no evidence of disease since treatment ended in 11/2007   Past Surgical History:  Procedure Laterality Date   CORONARY ATHERECTOMY N/A 04/26/2020   Procedure: CORONARY ATHERECTOMY;  Surgeon: Corky Crafts, MD;  Location: [facility] INVASIVE CV LAB;  Service: Cardiovascular;  Laterality: N/A;   CORONARY STENT INTERVENTION N/A 04/26/2020   Procedure: CORONARY STENT INTERVENTION;  Surgeon: Corky Crafts, MD;  Location: [facility] INVASIVE CV LAB;  Service: Cardiovascular;  Laterality: N/A;   CORONARY ULTRASOUND/IVUS N/A 04/26/2020   Procedure: Intravascular Ultrasound/IVUS;  Surgeon: Corky Crafts, MD;  Location: [facility] INVASIVE CV LAB;  Service: Cardiovascular;  Laterality: N/A;   Insertion of left subclavian Port-A-Cath  2008   LEFT HEART CATH AND CORONARY ANGIOGRAPHY N/A 07/03/2022   Procedure: LEFT HEART CATH AND CORONARY ANGIOGRAPHY;  Surgeon: Swazil[place], Peter M, MD;   Location: [facility] INVASIVE CV LAB;  Service: Cardiovascular;  Laterality: N/A;   RIGHT/LEFT HEART CATH AND CORONARY ANGIOGRAPHY N/A 04/26/2020   Procedure: RIGHT/LEFT HEART CATH AND CORONARY ANGIOGRAPHY;  Surgeon: Corky Crafts, MD;  Location: [facility] INVASIVE CV LAB;  Service: Cardiovascular;  Laterality: N/A;   TEMPORARY PACEMAKER N/A 04/26/2020   Procedure: TEMPORARY PACEMAKER;  Surgeon: Corky Crafts, MD;  Location: [facility] INVASIVE CV LAB;  Service: Cardiovascular;  Laterality: N/A;   Video bronchoscopy and video mediastinoscopy  2008   Patient Active Problem List   Diagnosis Date Noted   Acute CVA (cerebrovascular accident) (HCC) 07/13/2023   Dizziness 07/12/2023   Cerebellar stroke, acute (HCC) 07/07/2023   CVA (cerebral vascular accident) (HCC) 07/05/2023   Headache 07/05/2023   Prediabetes 02/01/2023   CKD (chronic kidney disease) stage 2, GFR 60-89 ml/min 02/01/2023   Nausea 02/01/2023   Colon cancer screening 02/01/2023   CHF (congestive heart failure) (HCC) 01/23/2023   Numbness and tingling of both lower extremities 01/23/2023   Encounter for general adult medical examination with abnormal findings 01/23/2023   Allergic rhinitis 01/23/2022   Asthmatic bronchitis, mild persistent, uncomplicated 01/23/2022   Upper airway cough syndrome 01/23/2022   Simple chronic bronchitis (HCC) 12/28/2021   Seasonal allergies 12/28/2021   DOE (dyspnea on exertion)    Nosebleed 10/25/2014   Lung cancer (HCC) 02/05/2010   CAD (coronary artery disease), native coronary artery 02/05/2010   Mixed hyperlipidemia  08/16/2009   Essential hypertension 07/08/2009    ONSET DATE: 07/05/23  REFERRING DIAG: Z60.109 (ICD-10-CM) - Cerebrovascular accident (CVA) due to stenosis of right vertebral artery (HCC)  THERAPY DIAG:  Cerebrovascular accident (CVA) due to stenosis of right vertebral artery (HCC)  Difficulty in walking, not elsewhere classified  Bilateral leg numbness  Other abnormalities of  gait and mobility  Acute CVA (cerebrovascular accident) (HCC)  Rationale for Evaluation and Treatment: Rehabilitation  SUBJECTIVE:                                                                                                                                                                                             SUBJECTIVE STATEMENT: Pt reports no issues today.  Doing his HEP.  Woke up nauseated and weak on right side 07/05/23; went to hospital later that day.  After hospitalization was in inpatient rehab 7 days; d/c home 10/13; has been doing his HEP at home.  Walks in today without AD; "having a bad day today"; did not sleep well last night.  Generally walking is the most challenging thing for him at home. Right side weakness; left hand dominant Pt accompanied by: significant other wife Jeremy Lopez  PERTINENT HISTORY: lung cancer 2008  PAIN:  Are you having pain? No  PRECAUTIONS: Fall  WEIGHT BEARING RESTRICTIONS: No  FALLS: Has patient fallen in last 6 months? No  LIVING ENVIRONMENT: Lives with: lives with their spouse Lives in: House/apartment Stairs: Yes: Internal: 13 steps; on right going up, on left going up, and can reach both and External: 1 steps; none Has following equipment at home: Single point cane, Walker - 4 wheeled, and shower chair  PLOF: Independent  PATIENT GOALS: get back to normal  OBJECTIVE:  Note: Objective measures were completed at Evaluation unless otherwise noted.  DIAGNOSTIC FINDINGS:   COGNITION: Overall cognitive status: Within functional limits for tasks assessed   SENSATION: WFL  COORDINATION: Mild decreased right side versus left   MUSCLE TONE: appears wnl  DTRs:  Not tested at eval  POSTURE: rounded shoulders and forward head  LOWER EXTREMITY ROM:     Active  Right Eval Left Eval  Hip flexion    Hip extension    Hip abduction    Hip adduction    Hip internal rotation    Hip external rotation    Knee flexion    Knee  extension    Ankle dorsiflexion    Ankle plantarflexion    Ankle inversion    Ankle eversion     (Blank rows = not tested)  LOWER EXTREMITY MMT:    MMT Right Eval Left  Eval  Hip flexion 4+ 5  Hip extension 4 4+  Hip abduction    Hip adduction    Hip internal rotation    Hip external rotation    Knee flexion 4+ 4+  Knee extension 5 5  Ankle dorsiflexion 4+ 5  Ankle plantarflexion    Ankle inversion    Ankle eversion    (Blank rows = not tested)  BED MOBILITY:  Sit to supine Modified independence Supine to sit Modified independence  TRANSFERS: Assistive device utilized: None  Sit to stand: SBA Stand to sit: SBA Chair to chair: SBA Floor: not tested  STAIRS: Level of Assistance: CGA Stair Negotiation Technique: Alternating Pattern  with Single Rail on Right Number of Stairs: 8  Height of Stairs: 7"  Comments: slow descent  GAIT: Gait pattern: decreased arm swing- Right, decreased arm swing- Left, decreased step length- Right, decreased stance time- Right, and shuffling Distance walked: 100 ft in clinic Assistive device utilized: None Level of assistance: CGA Comments: ocassional misstep and path deviation but no loss of balance noted  FUNCTIONAL TESTS:  5 times sit to stand: 17.21 sec hands on thighs  Dynamic Gait Index: 13/24 SLS unable   DGI 1. Gait level surface (2) Mild Impairment: Walks 20', uses assistive devices, slower speed, mild gait deviations. 2. Change in gait speed (2) Mild Impairment: Is able to change speed but demonstrates mild gait deviations, or not gait deviations but unable to achieve a significant change in velocity, or uses an assistive device. 3. Gait with horizontal head turns (1) Moderate Impairment: Performs head turns with moderate change in gait velocity, slows down, staggers but recovers, can continue to walk. 4. Gait with vertical head turns 1) Moderate Impairment: Performs head turns with moderate change in gait velocity,  slows down, staggers but recovers, can continue to walk. 5. Gait and pivot turn (1) Moderate Impairment: Turns slowly, requires verbal cueing, requires several small steps to catch balance following turn and stop. 6. Step over obstacle (2) Mild Impairment: Is able to step over box, but must slow down and adjust steps to clear box safely. 7. Step around obstacles (2) Mild Impairment: Is able to step around both cones, but must slow down and adjust steps to clear cones. 8. Stairs (2) Mild Impairment: Alternating feet, must use rail.  TOTAL SCORE: 13 / 24  PATIENT SURVEYS:  LEFS 46/80 57.5%  TODAY'S TREATMENT:                                                                                                                              DATE:  08/15/23 Nustep seat 10 level 6; 10 minutes UE/LE Standing: heelraises 20X  Toeraises 20X BP:  183/91; 157/90 in Rt UE   08/08/23 Nustep warm up; seat 10, level 3; 10 minutes Standing:   Heel raises  Toe raises  Tandem stance 30" X3 each LE lead; cuing for 50% weight distribution on both feet Forward step  ups 7" with 1 HHA x 15 each Lateral step ups 7" with 1 HHA x 15 each Diagonal stepping x 15 each direction  Cane trail gait training  08/06/23 Standing:  heelraises  Toeraises  Hip abduction 10X  Hip extension 10X  SLS Rt: 6"  Lt: 3"  Tandem stance 30" X3 each LE lead  Vectors 10X3" each LE with 1 UE assist  Forward step ups 7" with 1 HHA 10X each  Lateral step ups 7" with heel tap bil UE assist 10X each  Reciprocal stairwell with 1-2 UE assist 5RT Sit to stands 10X no UE  Bike seat 14 6 minutes level 4 at EOS  08/01/2023 physical therapy evaluation and HEP instruction    PATIENT EDUCATION: Education details: Patient educated on exam findings, POC, scope of PT, HEP, and what to expect next visit. Person educated: Patient Education method: Explanation, Demonstration, and Handouts Education comprehension: verbalized understanding,  returned demonstration, verbal cues required, and tactile cues required  HOME EXERCISE PROGRAM: Access Code: [email or web address] URL: [email or web address] Date: 08/01/2023 Prepared by: [facility] - Rehab  Exercises - Sit to Stand  - 2 x daily - 7 x weekly - 1 sets - 10 reps - Standing Single Leg Stance with Counter Support  - 2 x daily - 7 x weekly - 1 sets - 5 reps - 10 sec hold  Access Code: [email or web address] URL: [email or web address] Date: 08/08/2023 Prepared by: [facility] - Rehab  Exercises - Standing March with Counter Support  - 1 x daily - 7 x weekly - 3 sets - 10 reps - Standing Tandem Balance with Counter Support  - 1 x daily - 7 x weekly - 3 sets - 10 reps - Narrow Stance with Counter Support  - 1 x daily - 7 x weekly - 3 sets - 10 reps - Standing Single Leg Stance with Counter Support  - 1 x daily - 7 x weekly - 3 sets - 10 reps - Heel Raises with Counter Support  - 1 x daily - 7 x weekly - 2 sets - 15 reps - Mini Squat with Counter Support  - 1 x daily - 7 x weekly - 3 sets - 10 reps - Sit to Stand with Counter Support  - 1 x daily - 7 x weekly - 3 sets - 10 reps - Side Stepping with Resistance at Ankles and Counter Support  - 1 x daily - 7 x weekly - 3 sets - 10 reps - Seated Hip Abduction with Resistance  - 1 x daily - 7 x weekly - 3 sets - 10 reps - Clam with Resistance  - 1 x daily - 7 x weekly - 3 sets - 10 reps - Bridge with Hip Abduction and Resistance  - 1 x daily - 7 x weekly - 3 sets - 10 reps  GOALS: Goals reviewed with patient? No  SHORT TERM GOALS: Target date: 08/22/2023  patient will be independent with initial HEP Baseline: Goal status: INITIAL  2.  Patient will self report 50% improvement to improve tolerance for functional activity  Baseline:  Goal status: INITIAL  LONG TERM GOALS: Target date: 09/12/2023  Patient will be independent in self management strategies to improve quality of life and functional outcomes.  Baseline:  Goal status:  INITIAL  2.  Patient will self report 75% improvement to improve tolerance for functional activity  Baseline:  Goal status: INITIAL  3.  Patient will increase  leg MMTs to 5/5 without pain to promote  return to ambulation community distances with minimal deviation.  Baseline: see above Goal status: INITIAL  4.  Patient will improve DGI score by 4 points to demonstrate improved functional balance with gait Baseline: 13/24 Goal status: INITIAL  5.  Patient will be able to stand SLS x 5" each leg to demonstrate improved functional balance Baseline: 0" Goal status: INITIAL  6.  Patient will be able to walk community distances safely with LRAD modified independently Baseline: needs CGA for safety level surfaces Goal status: INITIAL  ASSESSMENT:  CLINICAL IMPRESSION: Continued focus on improving LE strength, balance and safety with gait.  Began with nustep for warm up and activity tolerance.  Pt able to increase to level 6 today without noted fatigue or difficulty.  Following completion of 2 standing exercises, pt requested therapist to take his BP as he feels his at home are not accurate as they are reading high.  BP taken both with automatic cuff and manually with reading of 183/91 mmHg intially and 160/85 mmHg after 5 minute rest. Pt reported he felt fine, however concerned as his BP never stayed this high and feels it needs follow up.  Pt to contact MD office to evaluate this; returns next week for therapy and will keep [place] updated. Therapy stopped at this point today.   Session started with a review of HEP and organization for ease of completion. Discussed forward and backward weight shifting prior to tandem stance balance to feel proper weight distribution. Patient was able to correct following cues and replicate independently on second set. Patient was able to hold conversation throughout today's session while exercising. Occasionally needed rests between exercises. Introduction of  diagonal stepping for fall recovery and balance maintenance. Patient showed quick understanding and independence during a trial of gait training with a SPC. Occasional coordination loss of contralateral arm/leg movement, but was able to stop safely and start reciprocal movement again.   Pt will continue to benefit from skilled physical therapy services to address his deficits to improve level of function with ADLs, functional mobility tasks, and reduce risk for falls.    OBJECTIVE IMPAIRMENTS: Abnormal gait, decreased activity tolerance, decreased balance, decreased knowledge of use of DME, decreased mobility, difficulty walking, decreased strength, decreased safety awareness, and impaired perceived functional ability.   ACTIVITY LIMITATIONS: carrying, lifting, bending, standing, squatting, stairs, transfers, and locomotion level  PARTICIPATION LIMITATIONS: meal prep, cleaning, driving, shopping, community activity, and yard work  [facility] POTENTIAL: Good  CLINICAL DECISION MAKING: Evolving/moderate complexity  EVALUATION COMPLEXITY: Moderate  PLAN:  PT FREQUENCY: 2x/week  PT DURATION: 6 weeks  PLANNED INTERVENTIONS: 97164- PT Re-evaluation, 97110-Therapeutic exercises, 97530- Therapeutic activity, 97112- Neuromuscular re-education, 97535- Self Care, 16109- Manual therapy, [id number]- Gait training, [id number]- Orthotic Fit/training, [phone]- Canalith repositioning, [id number]- Aquatic Therapy, [phone]- Splinting, Patient/Family education, Balance training, Stair training, Taping, Dry Needling, Joint mobilization, Joint manipulation, Spinal manipulation, Spinal mobilization, Scar mobilization, and DME instructions.   PLAN FOR NEXT SESSION: continue to increase lower extremity strengthening; gait and balance. Continue to encourage patient to progress to [facility] for out of home use.  Follow up on MD appt/BP and monitor as needed.   12:37 PM, 08/15/23 Lurena Nida, PTA/CLT [facility]  Rehabilitation [facility] Ph: [phone]

## 2023-08-19 ENCOUNTER — Encounter: Payer: HMO | Attending: Physical Medicine and Rehabilitation | Admitting: Physical Medicine and Rehabilitation

## 2023-08-19 ENCOUNTER — Encounter: Payer: Self-pay | Admitting: Physical Medicine and Rehabilitation

## 2023-08-19 ENCOUNTER — Telehealth: Payer: Self-pay | Admitting: Nurse Practitioner

## 2023-08-19 VITALS — BP 164/85 | HR 74 | Ht 70.0 in | Wt 202.2 lb

## 2023-08-19 DIAGNOSIS — I63211 Cerebral infarction due to unspecified occlusion or stenosis of right vertebral arteries: Secondary | ICD-10-CM | POA: Insufficient documentation

## 2023-08-19 DIAGNOSIS — I639 Cerebral infarction, unspecified: Secondary | ICD-10-CM | POA: Insufficient documentation

## 2023-08-19 DIAGNOSIS — F411 Generalized anxiety disorder: Secondary | ICD-10-CM | POA: Diagnosis not present

## 2023-08-19 DIAGNOSIS — R32 Unspecified urinary incontinence: Secondary | ICD-10-CM | POA: Diagnosis not present

## 2023-08-19 DIAGNOSIS — F4323 Adjustment disorder with mixed anxiety and depressed mood: Secondary | ICD-10-CM | POA: Insufficient documentation

## 2023-08-19 DIAGNOSIS — F32A Depression, unspecified: Secondary | ICD-10-CM | POA: Diagnosis not present

## 2023-08-19 DIAGNOSIS — F331 Major depressive disorder, recurrent, moderate: Secondary | ICD-10-CM | POA: Insufficient documentation

## 2023-08-19 DIAGNOSIS — I10 Essential (primary) hypertension: Secondary | ICD-10-CM | POA: Insufficient documentation

## 2023-08-19 DIAGNOSIS — R1310 Dysphagia, unspecified: Secondary | ICD-10-CM | POA: Diagnosis not present

## 2023-08-19 DIAGNOSIS — R066 Hiccough: Secondary | ICD-10-CM | POA: Diagnosis not present

## 2023-08-19 MED ORDER — ESCITALOPRAM OXALATE 10 MG PO TABS: 10.0000 mg | ORAL_TABLET | Freq: Every day | ORAL | 1 refills | Status: AC

## 2023-08-19 MED ORDER — IRBESARTAN 300 MG PO TABS: 300.0000 mg | ORAL_TABLET | Freq: Every day | ORAL | 1 refills | Status: DC

## 2023-08-19 MED ORDER — DILTIAZEM HCL 120 MG PO TABS: 120.0000 mg | ORAL_TABLET | Freq: Every day | ORAL | 3 refills | Status: DC

## 2023-08-19 MED ORDER — TAMSULOSIN HCL 0.4 MG PO CAPS: 0.4000 mg | ORAL_CAPSULE | Freq: Every day | ORAL | 3 refills | Status: AC

## 2023-08-19 NOTE — Patient Instructions (Signed)
HTN: Advised checking BP daily at home and logging results to bring into follow-up appointment with PCP and myself. -Reviewed BP meds today.  -Advised regarding healthy foods that can help lower blood pressure and provided with a list: 1) citrus foods- high in vitamins and minerals 2) salmon and other fatty fish - reduces inflammation and oxylipins 3) swiss chard (leafy green)- high level of nitrates 4) pumpkin seeds- one of the best natural sources of magnesium 5) Beans and lentils- high in fiber, magnesium, and potassium 6) Berries- high in flavonoids 7) Amaranth (whole grain, can be cooked similarly to rice and oats)- high in magnesium and fiber 8) Pistachios- even more effective at reducing BP than other nuts 9) Carrots- high in phenolic compounds that relax blood vessels and reduce inflammation 10) Celery- contain phthalides that relax tissues of arterial walls 11) Tomatoes- can also improve cholesterol and reduce risk of heart disease 12) Broccoli- good source of magnesium, calcium, and potassium 13) Greek yogurt: high in potassium and calcium 14) Herbs and spices: Celery seed, cilantro, saffron, lemongrass, black cumin, ginseng, cinnamon, cardamom, sweet basil, and ginger 15) Chia and flax seeds- also help to lower cholesterol and blood sugar 16) Beets- high levels of nitrates that relax blood vessels  17) spinach and bananas- high in potassium  -Provided lise of supplements that can help with hypertension:  1) magnesium: one high quality brand is Bioptemizers since it contains all 7 types of magnesium, otherwise over the counter magnesium gluconate 400mg is a good option 2) B vitamins 3) vitamin D 4) potassium 5) CoQ10 6) L-arginine 7) Vitamin C 8) Beetroot -Educated that goal BP is 120/80. -Made goal to incorporate some of the above foods into diet.   

## 2023-08-19 NOTE — Telephone Encounter (Signed)
Left voicemail with instructions MyChart message sent

## 2023-08-19 NOTE — Progress Notes (Unsigned)
Subjective:    Patient ID: Jeremy Lopez, male    DOB: August 22, 1951, 72 y.o.   MRN: [id number]  HPI  Pain Inventory Average Pain 0 Pain Right Now 0  BOWEL Number of stools per week: 4 Oral laxative use Yes  Type of laxative stool softner Incontinent Yes   BLADDER Normal Frequent urination Yes   walk without assistance how many minutes can you walk? 15 ability to climb steps?  yes do you drive?  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  no  Physicians involved in your care Any changes since last visit?  no   Family History  Problem Relation Age of Onset   Cancer Father    CAD Brother    Hypertension Brother    Depression Maternal Uncle    Alcohol abuse Maternal Uncle    Social History   Socioeconomic History   Marital status: Married    Spouse name: [name]   Number of children: 2   Years of education: 14   Highest education level: Tax adviser degree: occupational, Scientist, product/process development, or vocational program  Occupational History   Occupation: Full time    Comment: Industrial/product designer for [employer]   Occupation: Retired Industrial/product designer    Comment: [employer]  Tobacco Use   Smoking status: Former    Current packs/day: 0.00    Types: Cigarettes    Start date: 10/25/1966    Quit date: 10/25/1998    Years since quitting: 24.8   Smokeless tobacco: Never  Vaping Use   Vaping status: Never Used  Substance and Sexual Activity   Alcohol use: Yes    Alcohol/week: 14.0 standard drinks of alcohol    Types: 14 Cans of beer per week    Comment: 1 drink per day   Drug use: Yes    Types: Marijuana   Sexual activity: Yes  Other Topics Concern   Not on file  Social History Narrative   Lives in [place] with wife. Enjoys reading, playing golf in free time.    Social Determinants of Health   Financial Resource Strain: Not on file  Food Insecurity: No Food Insecurity  (07/05/2023)   Hunger Vital Sign    Worried About Running Out of Food in the Last Year: Never true    Ran Out of Food in the Last Year: Never true  Transportation Needs: No Transportation Needs (07/05/2023)   PRAPARE - Administrator, Civil Service (Medical): No    Lack of Transportation (Non-Medical): No  Physical Activity: Not on file  Stress: Not on file  Social Connections: Not on file   Past Surgical History:  Procedure Laterality Date   CORONARY ATHERECTOMY N/A 04/26/2020   Procedure: CORONARY ATHERECTOMY;  Surgeon: Corky Crafts, MD;  Location: [facility] INVASIVE CV LAB;  Service: Cardiovascular;  Laterality: N/A;   CORONARY STENT INTERVENTION N/A 04/26/2020   Procedure: CORONARY STENT INTERVENTION;  Surgeon: Corky Crafts, MD;  Location: [facility] INVASIVE CV LAB;  Service: Cardiovascular;  Laterality: N/A;   CORONARY ULTRASOUND/IVUS N/A 04/26/2020   Procedure: Intravascular Ultrasound/IVUS;  Surgeon: Corky Crafts, MD;  Location: [facility] INVASIVE CV LAB;  Service: Cardiovascular;  Laterality: N/A;   Insertion of left subclavian Port-A-Cath  2008   LEFT HEART CATH AND CORONARY ANGIOGRAPHY N/A 07/03/2022   Procedure: LEFT HEART CATH AND CORONARY ANGIOGRAPHY;  Surgeon: Swazil[place], Peter M, MD;  Location: [facility] INVASIVE CV  LAB;  Service: Cardiovascular;  Laterality: N/A;   RIGHT/LEFT HEART CATH AND CORONARY ANGIOGRAPHY N/A 04/26/2020   Procedure: RIGHT/LEFT HEART CATH AND CORONARY ANGIOGRAPHY;  Surgeon: Corky Crafts, MD;  Location: [facility] INVASIVE CV LAB;  Service: Cardiovascular;  Laterality: N/A;   TEMPORARY PACEMAKER N/A 04/26/2020   Procedure: TEMPORARY PACEMAKER;  Surgeon: Corky Crafts, MD;  Location: [facility] INVASIVE CV LAB;  Service: Cardiovascular;  Laterality: N/A;   Video bronchoscopy and video mediastinoscopy  2008   Past Medical History:  Diagnosis Date   CAD (coronary artery disease), native coronary artery    DES proximal LAD 07/2009; DES to RCA 04/2020    Chronic heart failure with preserved ejection fraction (HFpEF) (HCC)    Essential hypertension    Habitual alcohol use    Hyperlipidemia    PVC's (premature ventricular contractions)    Small cell lung cancer (HCC)    Right upper lobe 2008; no evidence of disease since treatment ended in 11/2007   BP (!) 164/85   Pulse 74   Ht 5\' 10"  (1.778 m)   Wt 202 lb 3.2 oz (91.7 kg)   SpO2 95%   BMI 29.01 kg/m   Opioid Risk Score:   Fall Risk Score:  `1  Depression screen PHQ 2/9     08/02/2023    1:04 PM 04/30/2023    1:15 PM 04/03/2023    2:19 PM 03/08/2023    1:18 PM 01/18/2023   10:06 AM 10/21/2020    2:22 PM 07/14/2020    2:59 PM  Depression screen PHQ 2/9  Decreased Interest 1   0 0 0 0  Down, Depressed, Hopeless 1   0 0 0 0  PHQ - 2 Score 2   0 0 0 0  Altered sleeping 1   1 3  0 0  Tired, decreased energy 1   1 3  0 1  Change in appetite 1   0 0 0 0  Feeling bad or failure about yourself  1   0 0 0 0  Trouble concentrating 1   0 0 0 0  Moving slowly or fidgety/restless 1   0 0 0 0  Suicidal thoughts    0 0 0 0  PHQ-9 Score 8   2 6  0 1  Difficult doing work/chores Not difficult at all   Not difficult at all  Not difficult at all Not difficult at all     Information is confidential and restricted. Go to Review Flowsheets to unlock data.     Review of Systems  Constitutional: Negative.   HENT: Negative.    Eyes: Negative.   Respiratory: Negative.    Cardiovascular: Negative.   Gastrointestinal:        Bowel control  Endocrine: Negative.   Genitourinary:        Bladder control  Musculoskeletal: Negative.   Skin: Negative.   Allergic/Immunologic: Negative.   Neurological: Negative.   Hematological: Negative.   Psychiatric/Behavioral:  Positive for dysphoric mood.   All other systems reviewed and are negative.      Objective:   Physical Exam        Assessment & Plan:

## 2023-08-19 NOTE — Progress Notes (Unsigned)
   Subjective:    Patient ID: Jeremy Lopez, male    DOB: [birth year], 72 y.o.   MRN: [id number]  HPI Jeremy Lopez is a 72 year old man who presents to establish care for   1) Hiccups -not that often  2) Dysphagia -has some difficulty swallowing  3) Depression -has a little  4) Incontinence -urinary   5) HTN -BP is 164/85 today -he asks why his diltiazem was stopped -has communicated with his cardiologist  6) Impaired memory   Review of Systems     Objective:   Physical Exam  Gen: no distress, normal appearing HEENT: oral mucosa pink and moist, NCAT Cardio: Reg rate Chest: normal effort, normal rate of breathing Abd: soft, non-distended Ext: no edema Psych: pleasant, normal affect Skin: intact Neuro: Alert and oriented x3, ambulating without AD, answers questions with good medical knowledge     Assessment & Plan:   1) Urinary urgency/incontinence: -flomax prescribed  2) HTN -flomax prescribed  3) Depression:  -discussed that it is related to everybody feeling sorry for him. -refilled Lexapro  4) Impaired memory: -continue crosswords  5) Overweight: -discussed that he has lost 20 lbs -discussed his goal is to get to 185 lbs -discussed not forcing himself to eat when he is not hungry -discussed prioritizing protein -avoid processed foods  Discussed that his weight conference improved from 42 to 38 -discussed that stopping beer and soda likely greatly helped  6) CVA: -advised walking after meals -advised walking outside with family -vitamin D level ordered  7) Dysphagia:  -discussed swallowing difficulties but that it is not a significant problem.  -advised chewing foods cautiously

## 2023-08-20 ENCOUNTER — Ambulatory Visit (HOSPITAL_COMMUNITY): Payer: HMO | Admitting: Physical Therapy

## 2023-08-21 ENCOUNTER — Telehealth: Payer: Self-pay | Admitting: *Deleted

## 2023-08-21 DIAGNOSIS — I63211 Cerebral infarction due to unspecified occlusion or stenosis of right vertebral arteries: Secondary | ICD-10-CM | POA: Diagnosis not present

## 2023-08-21 NOTE — Telephone Encounter (Signed)
Todd pharmacist from [facility] called with questions about order sent in by Dr Carlis Abbott for Diltiazem 120 mg 1 tablet q day on 08/19/23. This is IMMEDIATE RELEASE medication and is usually given more than once per day. He was previously on 240mg  Extended release q day. Was this a dose decrease?? What is the intention of this dosing??  [Please see patient message note on 08/19/23 Sharlene Dory NP (cardiology) in reference to this medication as well]

## 2023-08-22 ENCOUNTER — Encounter: Payer: HMO | Admitting: Physical Medicine and Rehabilitation

## 2023-08-22 DIAGNOSIS — I10 Essential (primary) hypertension: Secondary | ICD-10-CM | POA: Diagnosis not present

## 2023-08-22 DIAGNOSIS — F32A Depression, unspecified: Secondary | ICD-10-CM

## 2023-08-22 LAB — VITAMIN D 25 HYDROXY (VIT D DEFICIENCY, FRACTURES): Vit D, 25-Hydroxy: 64.9 ng/mL (ref 30.0–100.0)

## 2023-08-22 NOTE — Progress Notes (Signed)
   Subjective:    Patient ID: Jeremy Lopez, male    DOB: 03-17-1951, 72 y.o.   MRN: [id number]  HPI An audio/video tele-health visit is felt to be the most appropriate encounter for this patient at this time. This is a follow up tele-visit via phone. The patient is at home. MD is at office. Prior to scheduling this appointment, our staff discussed the limitations of evaluation and management by telemedicine and the availability of in-person appointments. The patient expressed understanding and agreed to proceed.   Jeremy Lopez is a 72 year old man who presents to establish care for   1) Hiccups -not that often  2) Dysphagia -has some difficulty swallowing  3) Depression -has a little -wife gives him vitamin D every night  4) Incontinence -urinary   5) HTN -cardiologist increased his irbesartan to 300mg  -has communicated with his cardiologist  6) Impaired memory   Review of Systems     Objective:   Physical Exam PRIOR EXAM: Gen: no distress, normal appearing HEENT: oral mucosa pink and moist, NCAT Cardio: Reg rate Chest: normal effort, normal rate of breathing Abd: soft, non-distended Ext: no edema Psych: pleasant, normal affect Skin: intact Neuro: Alert and oriented x3, ambulating without AD, answers questions with good medical knowledge     Assessment & Plan:   1) Urinary urgency/incontinence: -flomax prescribed  2) HTN -flomax prescribed -discussed that he is taking irbesartan, advised increasing this dose to 300mg  as his cardiologist recommends rather than restarting diltiazem.   3) Depression:  -discussed that it is related to everybody feeling sorry for him. -refilled Lexapro -discussed vitamin D can be helpful  4) Impaired memory: -continue crosswords  5) Overweight: -discussed that he has lost 20 lbs -discussed his goal is to get to 185 lbs -discussed not forcing himself to eat when he is not hungry -discussed prioritizing protein -avoid  processed foods  Discussed that his weight conference improved from 42 to 38 -discussed that stopping beer and soda likely greatly helped  6) CVA: -advised walking after meals -advised walking outside with family -vitamin D level ordered  7) Dysphagia:  -discussed swallowing difficulties but that it is not a significant problem.  -advised chewing foods cautiously  5 minutes spent discussing that vitamin D supplement can help with depression, advised to increase irbesartan to 300mg  as cardiologist recommended rather than restart diltiazem

## 2023-08-23 ENCOUNTER — Ambulatory Visit (HOSPITAL_COMMUNITY): Payer: HMO

## 2023-08-23 DIAGNOSIS — R2689 Other abnormalities of gait and mobility: Secondary | ICD-10-CM

## 2023-08-23 DIAGNOSIS — I63211 Cerebral infarction due to unspecified occlusion or stenosis of right vertebral arteries: Secondary | ICD-10-CM | POA: Diagnosis not present

## 2023-08-23 DIAGNOSIS — R262 Difficulty in walking, not elsewhere classified: Secondary | ICD-10-CM

## 2023-08-23 DIAGNOSIS — R200 Anesthesia of skin: Secondary | ICD-10-CM

## 2023-08-23 NOTE — Telephone Encounter (Signed)
Notified pharmacist to cancel the rx for diltiazem.

## 2023-08-23 NOTE — Therapy (Signed)
OUTPATIENT PHYSICAL THERAPY TREATMENT   Patient Name: Jeremy Lopez MRN: [id number] DOB:1951-05-12, 72 y.o., male Today's Date: 08/23/2023   PCP: Delmar Landau, MD REFERRING PROVIDER: Horton Chin, MD  END OF SESSION:  PT End of Session - 08/23/23 1430     Visit Number 5    Number of Visits 12    Date for PT Re-Evaluation 09/12/23    Authorization Type [employer]    PT Start Time 1430    PT Stop Time 1510    PT Time Calculation (min) 40 min    Equipment Utilized During Treatment Gait belt    Activity Tolerance Patient tolerated treatment well    Behavior During Therapy WFL for tasks assessed/performed             Past Medical History:  Diagnosis Date   CAD (coronary artery disease), native coronary artery    DES proximal LAD 07/2009; DES to RCA 04/2020   Chronic heart failure with preserved ejection fraction (HFpEF) (HCC)    Essential hypertension    Habitual alcohol use    Hyperlipidemia    PVC's (premature ventricular contractions)    Small cell lung cancer (HCC)    Right upper lobe 2008; no evidence of disease since treatment ended in 11/2007   Past Surgical History:  Procedure Laterality Date   CORONARY ATHERECTOMY N/A 04/26/2020   Procedure: CORONARY ATHERECTOMY;  Surgeon: Corky Crafts, MD;  Location[facility] INVASIVE CV LAB;  Service: Cardiovascular;  Laterality: N/A;   CORONARY STENT INTERVENTION N/A 04/26/2020   Procedure: CORONARY STENT INTERVENTION;  Surgeon: Corky Crafts, MD;  Location: [facility] INVASIVE CV LAB;  Service: Cardiovascular;  Laterality: N/A;   CORONARY ULTRASOUND/IVUS N/A 04/26/2020   Procedure: Intravascular Ultrasound/IVUS;  Surgeon: Corky Crafts, MD;  Location: [facility] INVASIVE CV LAB;  Service: Cardiovascular;  Laterality: N/A;   Insertion of left subclavian Port-A-Cath  2008   LEFT HEART CATH AND CORONARY ANGIOGRAPHY N/A 07/03/2022   Procedure: LEFT HEART CATH AND CORONARY ANGIOGRAPHY;  Surgeon: Swaziland, Peter M, MD;   Location: [facility] INVASIVE CV LAB;  Service: Cardiovascular;  Laterality: N/A;   RIGHT/LEFT HEART CATH AND CORONARY ANGIOGRAPHY N/A 04/26/2020   Procedure: RIGHT/LEFT HEART CATH AND CORONARY ANGIOGRAPHY;  Surgeon: Corky Crafts, MD;  Location: [facility] INVASIVE CV LAB;  Service: Cardiovascular;  Laterality: N/A;   TEMPORARY PACEMAKER N/A 04/26/2020   Procedure: TEMPORARY PACEMAKER;  Surgeon: Corky Crafts, MD;  Location: [facility] INVASIVE CV LAB;  Service: Cardiovascular;  Laterality: N/A;   Video bronchoscopy and video mediastinoscopy  2008   Patient Active Problem List   Diagnosis Date Noted   Acute CVA (cerebrovascular accident) (HCC) 07/13/2023   Dizziness 07/12/2023   Cerebellar stroke, acute (HCC) 07/07/2023   CVA (cerebral vascular accident) (HCC) 07/05/2023   Headache 07/05/2023   Prediabetes 02/01/2023   CKD (chronic kidney disease) stage 2, GFR 60-89 ml/min 02/01/2023   Nausea 02/01/2023   Colon cancer screening 02/01/2023   CHF (congestive heart failure) (HCC) 01/23/2023   Numbness and tingling of both lower extremities 01/23/2023   Encounter for general adult medical examination with abnormal findings 01/23/2023   Allergic rhinitis 01/23/2022   Asthmatic bronchitis, mild persistent, uncomplicated 01/23/2022   Upper airway cough syndrome 01/23/2022   Simple chronic bronchitis (HCC) 12/28/2021   Seasonal allergies 12/28/2021   DOE (dyspnea on exertion)    Nosebleed 10/25/2014   Lung cancer (HCC) 02/05/2010   CAD (coronary artery disease), native coronary artery 02/05/2010   Mixed hyperlipidemia  08/16/2009   Essential hypertension 07/08/2009    ONSET DATE: 07/05/23  REFERRING DIAG: Z61.096 (ICD-10-CM) - Cerebrovascular accident (CVA) due to stenosis of right vertebral artery (HCC)  THERAPY DIAG:  Cerebrovascular accident (CVA) due to stenosis of right vertebral artery (HCC)  Difficulty in walking, not elsewhere classified  Bilateral leg numbness  Other abnormalities  of gait and mobility  Rationale for Evaluation and Treatment: Rehabilitation  SUBJECTIVE:                                                                                                                                                                                             SUBJECTIVE STATEMENT: His MD changed his BP medication a couple of times and states his BP is fluctuating some ; asks for [place] to check his BP before starting therapy. Has not done HEP the last 3 days due to BP.   Woke up nauseated and weak on right side 07/05/23; went to hospital later that day.  After hospitalization was in inpatient rehab 7 days; d/c home 10/13; has been doing his HEP at home.  Walks in today without AD; "having a bad day today"; did not sleep well last night.  Generally walking is the most challenging thing for him at home. Right side weakness; left hand dominant Pt accompanied by: significant other wife [name]  PERTINENT HISTORY: lung cancer 2008  PAIN:  Are you having pain? No  PRECAUTIONS: Fall  WEIGHT BEARING RESTRICTIONS: No  FALLS: Has patient fallen in last 6 months? No  LIVING ENVIRONMENT: Lives with: lives with their spouse Lives in: House/apartment Stairs: Yes: Internal: 13 steps; on right going up, on left going up, and can reach both and External: 1 steps; none Has following equipment at home: Single point cane, Walker - 4 wheeled, and shower chair  PLOF: Independent  PATIENT GOALS: get back to normal  OBJECTIVE:  Note: Objective measures were completed at Evaluation unless otherwise noted.  DIAGNOSTIC FINDINGS:   COGNITION: Overall cognitive status: Within functional limits for tasks assessed   SENSATION: WFL  COORDINATION: Mild decreased right side versus left   MUSCLE TONE: appears wnl  DTRs:  Not tested at eval  POSTURE: rounded shoulders and forward head  LOWER EXTREMITY ROM:     Active  Right Eval Left Eval  Hip flexion    Hip extension    Hip  abduction    Hip adduction    Hip internal rotation    Hip external rotation    Knee flexion    Knee extension    Ankle dorsiflexion    Ankle plantarflexion    Ankle inversion  Ankle eversion     (Blank rows = not tested)  LOWER EXTREMITY MMT:    MMT Right Eval Left Eval  Hip flexion 4+ 5  Hip extension 4 4+  Hip abduction    Hip adduction    Hip internal rotation    Hip external rotation    Knee flexion 4+ 4+  Knee extension 5 5  Ankle dorsiflexion 4+ 5  Ankle plantarflexion    Ankle inversion    Ankle eversion    (Blank rows = not tested)  BED MOBILITY:  Sit to supine Modified independence Supine to sit Modified independence  TRANSFERS: Assistive device utilized: None  Sit to stand: SBA Stand to sit: SBA Chair to chair: SBA Floor: not tested  STAIRS: Level of Assistance: CGA Stair Negotiation Technique: Alternating Pattern  with Single Rail on Right Number of Stairs: 8  Height of Stairs: 7"  Comments: slow descent  GAIT: Gait pattern: decreased arm swing- Right, decreased arm swing- Left, decreased step length- Right, decreased stance time- Right, and shuffling Distance walked: 100 ft in clinic Assistive device utilized: None Level of assistance: CGA Comments: ocassional misstep and path deviation but no loss of balance noted  FUNCTIONAL TESTS:  5 times sit to stand: 17.21 sec hands on thighs  Dynamic Gait Index: 13/24 SLS unable   DGI 1. Gait level surface (2) Mild Impairment: Walks 20', uses assistive devices, slower speed, mild gait deviations. 2. Change in gait speed (2) Mild Impairment: Is able to change speed but demonstrates mild gait deviations, or not gait deviations but unable to achieve a significant change in velocity, or uses an assistive device. 3. Gait with horizontal head turns (1) Moderate Impairment: Performs head turns with moderate change in gait velocity, slows down, staggers but recovers, can continue to walk. 4. Gait with  vertical head turns 1) Moderate Impairment: Performs head turns with moderate change in gait velocity, slows down, staggers but recovers, can continue to walk. 5. Gait and pivot turn (1) Moderate Impairment: Turns slowly, requires verbal cueing, requires several small steps to catch balance following turn and stop. 6. Step over obstacle (2) Mild Impairment: Is able to step over box, but must slow down and adjust steps to clear box safely. 7. Step around obstacles (2) Mild Impairment: Is able to step around both cones, but must slow down and adjust steps to clear cones. 8. Stairs (2) Mild Impairment: Alternating feet, must use rail.  TOTAL SCORE: 13 / 24  PATIENT SURVEYS:  LEFS 46/80 57.5%  TODAY'S TREATMENT:                                                                                                                              DATE:  08/23/2023 152/78 left arm in sitting to begin treatment Nustep seat 10 x 10' level 5 dynamic warm up 138/78 left arm in sitting after Nustep 7" step to tap no UE assist x 10 each 7" step  mini lunge no UE assist x 10 each leg 7" step ups with 1 UE assist  7" lateral step ups with 1 UE assist Heel raises 2 x 10 with 1 UE assist Toe raises 2 x 10 with  1 UE assist  BP in sitting following exercise 142/84 left arm Supine 182/90; 158/88    08/15/23 Nustep seat 10 level 6; 10 minutes UE/LE Standing: heelraises 20X  Toeraises 20X BP:  183/91; 157/90 in Rt UE   08/08/23 Nustep warm up; seat 10, level 3; 10 minutes Standing:   Heel raises  Toe raises  Tandem stance 30" X3 each LE lead; cuing for 50% weight distribution on both feet Forward step ups 7" with 1 HHA x 15 each Lateral step ups 7" with 1 HHA x 15 each Diagonal stepping x 15 each direction  Cane trail gait training  08/06/23 Standing:  heelraises  Toeraises  Hip abduction 10X  Hip extension 10X  SLS Rt: 6"  Lt: 3"  Tandem stance 30" X3 each LE lead  Vectors 10X3" each LE  with 1 UE assist  Forward step ups 7" with 1 HHA 10X each  Lateral step ups 7" with heel tap bil UE assist 10X each  Reciprocal stairwell with 1-2 UE assist 5RT Sit to stands 10X no UE  Bike seat 14 6 minutes level 4 at EOS  08/01/2023 physical therapy evaluation and HEP instruction    PATIENT EDUCATION: Education details: Patient educated on exam findings, POC, scope of PT, HEP, and what to expect next visit. Person educated: Patient Education method: Explanation, Demonstration, and Handouts Education comprehension: verbalized understanding, returned demonstration, verbal cues required, and tactile cues required  HOME EXERCISE PROGRAM: Access Code: [email or web address] URL: [email or web address] Date: 08/01/2023 Prepared by: [facility] - Rehab  Exercises - Sit to Stand  - 2 x daily - 7 x weekly - 1 sets - 10 reps - Standing Single Leg Stance with Counter Support  - 2 x daily - 7 x weekly - 1 sets - 5 reps - 10 sec hold  Access Code: [email or web address] URL: [email or web address] Date: 08/08/2023 Prepared by: [facility] - Rehab  Exercises - Standing March with Counter Support  - 1 x daily - 7 x weekly - 3 sets - 10 reps - Standing Tandem Balance with Counter Support  - 1 x daily - 7 x weekly - 3 sets - 10 reps - Narrow Stance with Counter Support  - 1 x daily - 7 x weekly - 3 sets - 10 reps - Standing Single Leg Stance with Counter Support  - 1 x daily - 7 x weekly - 3 sets - 10 reps - Heel Raises with Counter Support  - 1 x daily - 7 x weekly - 2 sets - 15 reps - Mini Squat with Counter Support  - 1 x daily - 7 x weekly - 3 sets - 10 reps - Sit to Stand with Counter Support  - 1 x daily - 7 x weekly - 3 sets - 10 reps - Side Stepping with Resistance at Ankles and Counter Support  - 1 x daily - 7 x weekly - 3 sets - 10 reps - Seated Hip Abduction with Resistance  - 1 x daily - 7 x weekly - 3 sets - 10 reps - Clam with Resistance  - 1 x daily - 7 x weekly - 3 sets - 10 reps - Bridge  with Hip Abduction and Resistance  - 1 x daily - 7 x weekly -  3 sets - 10 reps  GOALS: Goals reviewed with patient? No  SHORT TERM GOALS: Target date: 08/22/2023  patient will be independent with initial HEP Baseline: Goal status: INITIAL  2.  Patient will self report 50% improvement to improve tolerance for functional activity  Baseline:  Goal status: INITIAL  LONG TERM GOALS: Target date: 09/12/2023  Patient will be independent in self management strategies to improve quality of life and functional outcomes.  Baseline:  Goal status: INITIAL  2.  Patient will self report 75% improvement to improve tolerance for functional activity  Baseline:  Goal status: INITIAL  3.  Patient will increase  leg MMTs to 5/5 without pain to promote return to ambulation community distances with minimal deviation.  Baseline: see above Goal status: INITIAL  4.  Patient will improve DGI score by 4 points to demonstrate improved functional balance with gait Baseline: 13/24 Goal status: INITIAL  5.  Patient will be able to stand SLS x 5" each leg to demonstrate improved functional balance Baseline: 0" Goal status: INITIAL  6.  Patient will be able to walk community distances safely with LRAD modified independently Baseline: needs CGA for safety level surfaces Goal status: INITIAL  ASSESSMENT:  CLINICAL IMPRESSION: Continued focus on improving LE strength, balance and safety with gait. Checked BP before, during and after treatment; all numbers within normal limits.   Began with nustep for warm up and activity tolerance. Patient needs occasional cues to clear right foot with step ups.  Discussed increased awareness of toe clearance for steps.  At the end of treatment today, patient stops to talk before leaving and states " I feel really dizzy, I need to sit down".  Patient BP systolic is elevated so PT laid him down and elevated his legs and BP came down to normal levels and he reported feeling  fine.  Discussed with him monitoring his BP and will call his MD first thing Monday morning to discuss continuing PT versus holding until BP stabilizes.    Pt will continue to benefit from skilled physical therapy services to address his deficits to improve level of function with ADLs, functional mobility tasks, and reduce risk for falls.    OBJECTIVE IMPAIRMENTS: Abnormal gait, decreased activity tolerance, decreased balance, decreased knowledge of use of DME, decreased mobility, difficulty walking, decreased strength, decreased safety awareness, and impaired perceived functional ability.   ACTIVITY LIMITATIONS: carrying, lifting, bending, standing, squatting, stairs, transfers, and locomotion level  PARTICIPATION LIMITATIONS: meal prep, cleaning, driving, shopping, community activity, and yard work  [facility] POTENTIAL: Good  CLINICAL DECISION MAKING: Evolving/moderate complexity  EVALUATION COMPLEXITY: Moderate  PLAN:  PT FREQUENCY: 2x/week  PT DURATION: 6 weeks  PLANNED INTERVENTIONS: 97164- PT Re-evaluation, 97110-Therapeutic exercises, 97530- Therapeutic activity, 97112- Neuromuscular re-education, 97535- Self Care, 16109- Manual therapy, [id number]- Gait training, [phone]- Orthotic Fit/training, [phone]- Canalith repositioning, [id number]- Aquatic Therapy, [phone]- Splinting, Patient/Family education, Balance training, Stair training, Taping, Dry Needling, Joint mobilization, Joint manipulation, Spinal manipulation, Spinal mobilization, Scar mobilization, and DME instructions.   PLAN FOR NEXT SESSION: continue to increase lower extremity strengthening; gait and balance. Continue to encourage patient to progress to [facility] for out of home use.  Follow up on MD appt/BP and monitor as needed.    3:37 PM, 08/23/23 Carlota Philley Small Fabiano Ginley MPT [facility] [place] [phone]

## 2023-08-26 ENCOUNTER — Telehealth (HOSPITAL_COMMUNITY): Payer: Self-pay

## 2023-08-26 ENCOUNTER — Telehealth: Payer: Self-pay | Admitting: Internal Medicine

## 2023-08-26 NOTE — Telephone Encounter (Signed)
Amy, a PT, called and said that patients BP was 189/90 with a dizzy spell on Friday. Had a couple of incidents with therapy due to BP. Wants to know if Dr. Tenny Craw want him to continue therapy due to BP not being regulated

## 2023-08-26 NOTE — Telephone Encounter (Signed)
Called to request physician input from Dr. Dietrich Pates regarding continued therapy versus hold due to flucuating BP.  Spoke with Michele Mcalpine requesting a call back.   8:53 AM, 08/26/23 Ami Thornsberry Small Uma Jerde MPT [facility] [place] [phone]

## 2023-08-26 NOTE — Telephone Encounter (Signed)
Spoke with Amy and she states he gets dizzy and his BP has been elevated. She states they have been monitoring it . She states SBP usually 130-140 but he stills gets dizzy. In Friday after PT he was dizzy and BP was 180/90. She would like to know if you want to continue PT until his BP gets controlled.  Tired to reach patient. LVM to return call to office

## 2023-08-27 ENCOUNTER — Encounter (HOSPITAL_COMMUNITY): Payer: Self-pay

## 2023-08-27 ENCOUNTER — Ambulatory Visit (HOSPITAL_COMMUNITY): Payer: HMO

## 2023-08-27 NOTE — Therapy (Signed)
[facility] Out[facility] Rehabilitation at [facility] [address] [place], [place], [address] Phone: [phone]   Fax:  [phone]  Patient Details  Name: Jeremy Lopez MRN: [id number] Date of Birth: [birth year] Referring Provider:  No ref. provider found  Encounter Date: 08/27/2023   cancelled; Dr. Tenny Craw wants patient to hold therapy until BP stabilizes.   10:06 AM, 08/27/23 Suesan Mohrmann Small Nikia Mangino MPT [facility] [place] [phone]   [facility] Outpatient Rehabilitation at [facility] [address] [place], [place], [address] Phone: [phone]   Fax:  [phone]

## 2023-08-27 NOTE — Telephone Encounter (Signed)
WIth bump in blood pressure I would try low dose amlodipine   2.5 mg    Keep following BP   Can go back to PT if in 150s  Will need follow up for BP check  Bring cuff and log  in about 3 wks

## 2023-08-28 ENCOUNTER — Telehealth (HOSPITAL_COMMUNITY): Payer: Self-pay

## 2023-08-29 ENCOUNTER — Ambulatory Visit (HOSPITAL_COMMUNITY): Payer: HMO

## 2023-08-29 NOTE — Telephone Encounter (Signed)
Patient is following up requesting feedback ASAP. He states that he demands to speak with someone regarding his matter since he has not heard back yet.

## 2023-08-29 NOTE — Telephone Encounter (Signed)
I spoke with the pt earlier today and gave him the BP parameters per DR Tenny Craw.. will close this encounter.

## 2023-08-29 NOTE — Telephone Encounter (Signed)
Pt says that he cannot take the Amlodipine 2.5 mg due to terrible swelling when he was taking it previously... he says he has been taking his Irbesartan at night.. I have instructed him to take in the morning... his PCP also has him on Diltiazem 120 mg... he has OV 09/11/23. He will bring update log with him to his OV.   Readings:  11/15 143/79 114/60 155/85 157/86 153/82

## 2023-09-03 ENCOUNTER — Ambulatory Visit (HOSPITAL_COMMUNITY): Payer: HMO

## 2023-09-04 ENCOUNTER — Inpatient Hospital Stay: Payer: HMO | Admitting: Diagnostic Neuroimaging

## 2023-09-08 NOTE — Progress Notes (Unsigned)
Cardiology Office Note:  .   Date: 08/05/2023  ID:  [name], DOB 1951-03-12, MRN [id number] PCP: [name], MD  [facility] Providers Cardiologist:  Dietrich Pates, MD    History of Present Illness: .   Jeremy Lopez is a 72 y.o. male with a PMH of CAD, hyperlipidemia, hypertension, former tobacco user, leg edema, PVCs, CKD stage II, suspected HFpEF, past history of lung cancer and status post radiation/chemo, EtOH abuse, COPD/radiation fibrosis, (follows pulmonology), acute CVA, who presents today for scheduled follow-up.   Previous CV history includes DES to proximal LAD in 2010, the following year he received a drug-eluting stent to the mid RCA.  CTA previously revealed radiation fibrosis, history of DOE.  Echo 9/23 revealed EF 60 to 65%, grade 1 DD.  Cardiac authorization at that time revealed nonobstructive CAD in RCA and patent stents, mildly elevated LVEDP.  Was recommended to switch diltiazem to [place] and add Jardiance.   Saw Ronie Spies, PA-C in August 2024, blood pressure was elevated out of being out of his medications for several days.   Pt seen in  neurology  for gait issues, mild parkinsonian is him noted at recent neuro visit.  MRI revealed ventriculomegaly and NPH on the differential.  Hospital admission October 2024 for acute CVA.  MRI of the brain without contrast with acute versus early subacute infarct medial inferior right cerebellum.  Evaluated by neurology with recommendation for aspirin and Plavix x 3 weeks, then followed by aspirin alone.  Admitted to rehab for inpatient therapies.  Diltiazem 240 mg was discontinued on acute side due to orthostatic hypotension.  Pt seen by Shawnie Dapper in late Oct 2024    BP meds had been adjusted by PCPTaking plavix alone   Since seen the pt denies CP  Breathing is OK   Slowly improving       Studies Reviewed: .    Echo 06/2023:  1. Left ventricular ejection fraction, by estimation, is 60 to 65%. The  left ventricle  has normal function. The left ventricle has no regional  wall motion abnormalities. There is mild left ventricular hypertrophy.  Left ventricular diastolic parameters are consistent with Grade I diastolic dysfunction (impaired relaxation).   2. Right ventricular systolic function is normal. The right ventricular  size is normal. Tricuspid regurgitation signal is inadequate for assessing PA pressure.   3. The mitral valve is normal in structure. No evidence of mitral valve regurgitation.   4. The aortic valve is tricuspid. Aortic valve regurgitation is not  visualized. Aortic valve sclerosis is present, with no evidence of aortic valve stenosis.   5. The inferior vena cava is normal in size with greater than 50%  respiratory variability, suggesting right atrial pressure of 3 mmHg.  LHC 06/2022:    Mid RCA to Dist RCA lesion is 40% stenosed.   Mid RCA lesion is 30% stenosed.   Non-stenotic Prox LAD lesion was previously treated.   LV end diastolic pressure is mildly elevated.   Nonobstructive CAD. Prior stents in the LAD and RCA are patent Mildly elevated LVEDP 17 mm Hg   Plan: medical management.   Right/Left heart cath 04/2020:  Mid RCA lesion is 99% stenosed, heavily calcified. Following atherectomy, A drug-eluting stent was successfully placed using a STENT RESOLUTE ONYX 3.5X15, postdilated to 3.75 mm; stent optimized with IVUS. Post intervention, there is a 0% residual stenosis. Mid RCA to Dist RCA lesion is 40% stenosed. Previously placed Prox LAD stent, is  widely patent. The left ventricular systolic function is normal. LV end diastolic pressure is normal. The left ventricular ejection fraction is 55-65% by visual estimate. There is no aortic valve stenosis.   Continue aggressive secondary prevention.  Still eligible for same-day discharge.   Consider Plavix monotherapy given his diffuse disease after DAPT is completed.  Lexiscan 04/2020:  Nuclear stress EF: 68%. There was no  ST segment deviation noted during stress. No T wave inversion was noted during stress. The study is normal. This is a low risk study. The left ventricular ejection fraction is hyperdynamic (>65%).   1. Reduced counts in the inferior wall that improve on stress imaging with normal wall motion consistent with diaphragm attenuation.  2. No ischemia or infarction.  3. Normal LVEF, >65%.  4. Average exercise capacity (5:23 min:s; 7 METS). 5. Normal HR/BP response to exercise.  6. This is a low-risk study.  Physical Exam:   VS:  BP 134/72   Pulse 66   Ht 5\' 10"  (1.778 m)   Wt 203 lb 12.8 oz (92.4 kg)   SpO2 96%   BMI 29.24 kg/m    Wt Readings from Last 3 Encounters:  09/09/23 203 lb 12.8 oz (92.4 kg)  08/19/23 202 lb 3.2 oz (91.7 kg)  08/05/23 206 lb (93.4 kg)    GEN: Well nourished, well developed in no acute distress NECK: No JVD; No carotid bruit CARDIAC: S1/S2, RRR, no murmurs RESPIRATORY:  Clear to auscultation  EXTREMITIES:  No LE edema  ASSESSMENT AND PLAN: .    HFpEF Echo in Sept 2024  LVEF 60-65%, Grade 1 DD 06/2023.  Pt appears euvolemic on exam    Keep on current regimen    CAD,  LHC in Sept 2023   Mild CAD    Manage risk factors     3  HL    Last LDL 34 in September 2024   4  CVA Recent   Keep on Plavix  5 HTN BP fair     F/u in May / June 2025 Signed, Dietrich Pates, MD  Cardiology Office Note   Date:  09/08/2023   ID:  Jeremy CORRIS, DOB [birth year], MRN [id number]  PCP:  [name], MD  Cardiologist:   Dietrich Pates, MD    Patient presents for f/u of CAD    History of Present Illness: Jeremy Lopez is a 72 y.o. male with a history of  CAD   He is s/p PTCA/DES in 2010, PTCA/DES to m RCA in July 2021.  He also has a hx of HTN, HL, PFpEF, CKD, remote lung CA (rx chemo/xrt), EtOH, remote tobacco   In June 2021 he complained of chest pressure   Myovue was done and showed no ischemia   The pt continued to have CP, giving out with activity, SOB   CT of chest showed radiation fibrosis    With continued worsened symptoms,  he went on to [facility] in July 2021  LHC showed 99% mid RCA lesion  PT  underwent atherectomy with DES placment.   The pt also had a patent Prox LAD stent.   LVEF 55 to 65%  In Sept 2023 had increased DOE    Echo showed LVEF 60 to 65%, G1 DD, normal RV  LHC showed nonobstrucitve CAD, patent stents.   LVEDP mildly elevated.    Dilt switched to [facility].   Pt also started on jardiance  Seen in pulmonary  CT showed stable emphysema.    Seen by Eugenie Norrie in October  2023   Switched back to diltiazem    Since seen he denies CP  Breathing is OK   No dizziness    The pt resumed Dilt 240 just a few wks ago   BP still elevated    SBP at home 160s/    Has been taking Nyquil    Not on plavix         I saw the pt in Feb 2024  The pt was hospitalized in October for CVA  He was last seen in cardiology by Shawnie Dapper in Oct 2024   No outpatient medications have been marked as taking for the 09/09/23 encounter (Appointment) with Pricilla Riffle, MD.     Allergies:   Cephalexin and Amlodipine   Past Medical History:  Diagnosis Date   CAD (coronary artery disease), native coronary artery    DES proximal LAD 07/2009; DES to RCA 04/2020   Chronic heart failure with preserved ejection fraction (HFpEF) (HCC)    Essential hypertension    Habitual alcohol use    Hyperlipidemia    PVC's (premature ventricular contractions)    Small cell lung cancer (HCC)    Right upper lobe 2008; no evidence of disease since treatment ended in 11/2007    Past Surgical History:  Procedure Laterality Date   CORONARY ATHERECTOMY N/A 04/26/2020   Procedure: CORONARY ATHERECTOMY;  Surgeon: Corky Crafts, MD;  Location: [facility] INVASIVE CV LAB;  Service: Cardiovascular;  Laterality: N/A;   CORONARY STENT INTERVENTION N/A 04/26/2020   Procedure: CORONARY STENT INTERVENTION;  Surgeon: Corky Crafts, MD;  Location: [facility] INVASIVE CV LAB;  Service: Cardiovascular;  Laterality: N/A;   CORONARY ULTRASOUND/IVUS N/A 04/26/2020   Procedure: Intravascular Ultrasound/IVUS;  Surgeon: Corky Crafts, MD;  Location: [facility] INVASIVE CV LAB;  Service: Cardiovascular;  Laterality: N/A;   Insertion of left subclavian Port-A-Cath  2008   LEFT HEART CATH AND CORONARY ANGIOGRAPHY N/A 07/03/2022   Procedure: LEFT HEART CATH AND CORONARY ANGIOGRAPHY;  Surgeon: Swazil[place], Peter M, MD;  Location: [facility] INVASIVE  CV LAB;  Service: Cardiovascular;  Laterality: N/A;   RIGHT/LEFT HEART CATH AND CORONARY ANGIOGRAPHY N/A  04/26/2020   Procedure: RIGHT/LEFT HEART CATH AND CORONARY ANGIOGRAPHY;  Surgeon: Corky Crafts, MD;  Location: [facility] INVASIVE CV LAB;  Service: Cardiovascular;  Laterality: N/A;   TEMPORARY PACEMAKER N/A 04/26/2020   Procedure: TEMPORARY PACEMAKER;  Surgeon: Corky Crafts, MD;  Location: [facility] INVASIVE CV LAB;  Service: Cardiovascular;  Laterality: N/A;   Video bronchoscopy and video mediastinoscopy  2008     Social History:  The patient  reports that he quit smoking about 24 years ago. His smoking use included cigarettes. He started smoking about 56 years ago. He has never used smokeless tobacco. He reports current alcohol use of about 14.0 standard drinks of alcohol per week. He reports current drug use. Drug: Marijuana.   Family History:  The patient's family history includes Alcohol abuse in his maternal uncle; CAD in his brother; Cancer in his father; Depression in his maternal uncle; Hypertension in his brother.    ROS:  Please see the history of present illness. All other systems are reviewed and  Negative to the above problem except as noted.    PHYSICAL EXAM: VS:  BP 134/72   Pulse 66   Ht 5\' 10"  (1.778 m)   Wt 203 lb 12.8 oz (92.4 kg)   SpO2 96%   BMI 29.24 kg/m   GEN: Obese 72 yo  in no acute distress  HEENT: normal  Neck: no JVD, no carotid bruit Cardiac: RRR; no murmurs;  No signif LE edema  Respiratory:  CTA GI: soft, nontender, obese    MS: no deformity Moving all extremities   Skin: warm and dry,  Sl erythema Neuro:  Strength and sensation are intact Psych: euthymic mood, full affect   EKG:  EKG is not done today    LHC   Sept 2023    Mid RCA to Dist RCA lesion is 40% stenosed.   Mid RCA lesion is 30% stenosed.   Non-stenotic Prox LAD lesion was previously treated.   LV end diastolic pressure is mildly elevated.   Nonobstructive CAD. Prior stents in the LAD and RCA are patent Mildly elevated LVEDP 17 mm Hg   Echo 2023   1. Left ventricular  ejection fraction, by estimation, is 60 to 65%. The  left ventricle has normal function. The left ventricle has no regional  wall motion abnormalities. Left ventricular diastolic parameters are  consistent with Grade I diastolic  dysfunction (impaired relaxation). The average left ventricular global  longitudinal strain is -17.7 %. The global longitudinal strain is normal.   2. Right ventricular systolic function is normal. The right ventricular  size is normal. Tricuspid regurgitation signal is inadequate for assessing  PA pressure.   3. The mitral valve is grossly normal, mildly calcified. Trivial mitral  valve regurgitation.   4. The aortic valve is tricuspid. Aortic valve regurgitation is not  visualized.   5. The inferior vena cava is normal in size with greater than 50%  respiratory variability, suggesting right atrial pressure of 3 mmHg.    CARDIAC CATH   04/26/20   Mid RCA lesion is 99% stenosed, heavily calcified. Following atherectomy, A drug-eluting stent was successfully placed using a STENT RESOLUTE ONYX 3.5X15, postdilated to 3.75 mm; stent optimized with IVUS. Post intervention, there is a 0% residual stenosis. Mid RCA to Dist RCA lesion is 40% stenosed. Previously placed Prox LAD stent, is widely patent. The left ventricular systolic  function is normal. LV end diastolic pressure is normal. The left ventricular ejection fraction is 55-65% by visual estimate. There is no aortic valve stenosis.   Continue aggressive secondary prevention.  Still eligible for same-day discharge.   Consider Plavix monotherapy given his diffuse disease after DAPT is completed.    Chest CT  04/21/20   No pulmonary emboli.   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   Aortic Atherosclerosis (ICD10-I70.0). Coronary artery calcification.  Lipid Panel    Component Value Date/Time   CHOL 98 07/06/2023 0602   CHOL 162 01/18/2023 1053   TRIG 109  07/06/2023 0602   HDL 42 07/06/2023 0602   HDL 54 01/18/2023 1053   CHOLHDL 2.3 07/06/2023 0602   VLDL 22 07/06/2023 0602   LDLCALC 34 07/06/2023 0602   LDLCALC 76 01/18/2023 1053   LDLCALC 61 12/28/2019 1501      Wt Readings from Last 3 Encounters:  09/09/23 203 lb 12.8 oz (92.4 kg)  08/19/23 202 lb 3.2 oz (91.7 kg)  08/05/23 206 lb (93.4 kg)      ASSESSMENT AND PLAN:  1.  CAD  Recent cath in Sept 2023 showed no obstructive CAD   Resume Plavix  2  HTN  BP is elevated   I would double Entresto   Keep on diltiazem     Pt will MyChart response in a few wks     Plan for follow up if OK in Summer 2024     3  HL   Continue statin   LDL 40 in Sept 2023   4   Wt  I have discussed in past    WFPB diet      F/U in July 2024    Current medicines are reviewed at length with the patient today.  The patient does not have concerns regarding medicines.  Signed, Dietrich Pates, MD  09/08/2023 6:19 PM     [facility] [address] [place], [place], [place]  [address] Phone: [phone]; Fax: [phone]

## 2023-09-09 ENCOUNTER — Encounter: Payer: Self-pay | Admitting: Internal Medicine

## 2023-09-09 ENCOUNTER — Ambulatory Visit: Payer: HMO | Attending: Internal Medicine | Admitting: Internal Medicine

## 2023-09-09 VITALS — BP 134/72 | HR 66 | Ht 70.0 in | Wt 203.8 lb

## 2023-09-09 DIAGNOSIS — I639 Cerebral infarction, unspecified: Secondary | ICD-10-CM

## 2023-09-09 NOTE — Patient Instructions (Signed)
Medication Instructions:  Your physician recommends that you continue on your current medications as directed. Please refer to the Current Medication list given to you today.  *If you need a refill on your cardiac medications before your next appointment, please call your pharmacy*   Lab Work: NONE   If you have labs (blood work) drawn today and your tests are completely normal, you will receive your results only by: MyChart Message (if you have MyChart) OR A paper copy in the mail If you have any lab test that is abnormal or we need to change your treatment, we will call you to review the results.   Testing/Procedures: NONE     Follow-Up: At [facility], you and your health needs are our priority.  As part of our continuing mission to provide you with exceptional heart care, we have created designated Provider Care Teams.  These Care Teams include your primary Cardiologist (physician) and Advanced Practice Providers (APPs -  Physician Assistants and Nurse Practitioners) who all work together to provide you with the care you need, when you need it.  We recommend signing up for the patient portal called "MyChart".  Sign up information is provided on this After Visit Summary.  MyChart is used to connect with patients for Virtual Visits (Telemedicine).  Patients are able to view lab/test results, encounter notes, upcoming appointments, etc.  Non-urgent messages can be sent to your provider as well.   To learn more about what you can do with MyChart, go to [email or web address].    Your next appointment:    May / June   Provider:   You may see Dietrich Pates, MD or one of the following Advanced Practice Providers on your designated Care Team:   Randall An, PA-C  Jacolyn Reedy, PA-C     Other Instructions Thank you for choosing [facility]!

## 2023-09-10 ENCOUNTER — Encounter (HOSPITAL_COMMUNITY): Payer: Self-pay

## 2023-09-10 ENCOUNTER — Ambulatory Visit (HOSPITAL_COMMUNITY): Payer: HMO | Attending: Physical Medicine and Rehabilitation

## 2023-09-10 DIAGNOSIS — R262 Difficulty in walking, not elsewhere classified: Secondary | ICD-10-CM | POA: Diagnosis not present

## 2023-09-10 DIAGNOSIS — R200 Anesthesia of skin: Secondary | ICD-10-CM | POA: Diagnosis not present

## 2023-09-10 DIAGNOSIS — R2689 Other abnormalities of gait and mobility: Secondary | ICD-10-CM | POA: Insufficient documentation

## 2023-09-10 DIAGNOSIS — I639 Cerebral infarction, unspecified: Secondary | ICD-10-CM | POA: Diagnosis not present

## 2023-09-10 DIAGNOSIS — I63211 Cerebral infarction due to unspecified occlusion or stenosis of right vertebral arteries: Secondary | ICD-10-CM | POA: Insufficient documentation

## 2023-09-10 NOTE — Therapy (Signed)
OUTPATIENT PHYSICAL THERAPY TREATMENT  Progress Note Reporting Period 08/01/23 to 09/10/23  See note below for Objective Data and Assessment of Progress/Goals.     Patient Name: Jeremy Lopez MRN: [id number] DOB:[birth year], 72 y.o., male Today's Date: 09/12/2023   PCP: Delmar Landau, MD REFERRING PROVIDER: Horton Chin, MD  END OF SESSION:    Past Medical History:  Diagnosis Date   CAD (coronary artery disease), native coronary artery    DES proximal LAD 07/2009; DES to RCA 04/2020   Chronic heart failure with preserved ejection fraction (HFpEF) (HCC)    Essential hypertension    Habitual alcohol use    Hyperlipidemia    PVC's (premature ventricular contractions)    Small cell lung cancer (HCC)    Right upper lobe 2008; no evidence of disease since treatment ended in 11/2007   Past Surgical History:  Procedure Laterality Date   CORONARY ATHERECTOMY N/A 04/26/2020   Procedure: CORONARY ATHERECTOMY;  Surgeon: Corky Crafts, MD;  Location: [facility] INVASIVE CV LAB;  Service: Cardiovascular;  Laterality: N/A;   CORONARY STENT INTERVENTION N/A 04/26/2020   Procedure: CORONARY STENT INTERVENTION;  Surgeon: Corky Crafts, MD;  Location: [facility] INVASIVE CV LAB;  Service: Cardiovascular;  Laterality: N/A;   CORONARY ULTRASOUND/IVUS N/A 04/26/2020   Procedure: Intravascular Ultrasound/IVUS;  Surgeon: Corky Crafts, MD;  Location: [facility] INVASIVE CV LAB;  Service: Cardiovascular;  Laterality: N/A;   Insertion of left subclavian Port-A-Cath  2008   LEFT HEART CATH AND CORONARY ANGIOGRAPHY N/A 07/03/2022   Procedure: LEFT HEART CATH AND CORONARY ANGIOGRAPHY;  Surgeon: [place], Peter M, MD;  Location: [facility] INVASIVE CV LAB;  Service: Cardiovascular;  Laterality: N/A;   RIGHT/LEFT HEART CATH AND CORONARY ANGIOGRAPHY N/A 04/26/2020   Procedure: RIGHT/LEFT HEART CATH AND CORONARY ANGIOGRAPHY;  Surgeon: Corky Crafts, MD;  Location: [facility] INVASIVE CV LAB;  Service: Cardiovascular;   Laterality: N/A;   TEMPORARY PACEMAKER N/A 04/26/2020   Procedure: TEMPORARY PACEMAKER;  Surgeon: Corky Crafts, MD;  Location: [facility] INVASIVE CV LAB;  Service: Cardiovascular;  Laterality: N/A;   Video bronchoscopy and video mediastinoscopy  2008   Patient Active Problem List   Diagnosis Date Noted   Acute CVA (cerebrovascular accident) (HCC) 07/13/2023   Dizziness 07/12/2023   Cerebellar stroke, acute (HCC) 07/07/2023   CVA (cerebral vascular accident) (HCC) 07/05/2023   Headache 07/05/2023   Prediabetes 02/01/2023   CKD (chronic kidney disease) stage 2, GFR 60-89 ml/min 02/01/2023   Nausea 02/01/2023   Colon cancer screening 02/01/2023   CHF (congestive heart failure) (HCC) 01/23/2023   Numbness and tingling of both lower extremities 01/23/2023   Encounter for general adult medical examination with abnormal findings 01/23/2023   Allergic rhinitis 01/23/2022   Asthmatic bronchitis, mild persistent, uncomplicated 01/23/2022   Upper airway cough syndrome 01/23/2022   Simple chronic bronchitis (HCC) 12/28/2021   Seasonal allergies 12/28/2021   DOE (dyspnea on exertion)    Nosebleed 10/25/2014   Lung cancer (HCC) 02/05/2010   CAD (coronary artery disease), native coronary artery 02/05/2010   Mixed hyperlipidemia 08/16/2009   Essential hypertension 07/08/2009    ONSET DATE: 07/05/23  REFERRING DIAG: X32.440 (ICD-10-CM) - Cerebrovascular accident (CVA) due to stenosis of right vertebral artery (HCC)  THERAPY DIAG:  Cerebrovascular accident (CVA) due to stenosis of right vertebral artery (HCC) - Plan: PT plan of care cert/re-cert  Difficulty in walking, not elsewhere classified - Plan: PT plan of care cert/re-cert  Bilateral leg numbness - Plan: PT plan of care cert/re-cert  Other abnormalities of gait and mobility - Plan: PT plan of care cert/re-cert  Acute CVA (cerebrovascular accident) (HCC) - Plan: PT plan of care cert/re-cert  Rationale for Evaluation and Treatment:  Rehabilitation  SUBJECTIVE:                                                                                                                                                                                             SUBJECTIVE STATEMENT: Pt stated his BP was 135/91mmHg this morning.  No reports of pain, dizziness or recent falls.  Feels he has a little backwards with the 2 weeks off of therapy, but feels he has improved by 50%.   Woke up nauseated and weak on right side 07/05/23; went to hospital later that day.  After hospitalization was in inpatient rehab 7 days; d/c home 10/13; has been doing his HEP at home.  Walks in today without AD; "having a bad day today"; did not sleep well last night.  Generally walking is the most challenging thing for him at home. Right side weakness; left hand dominant Pt accompanied by: significant other wife [name]  PERTINENT HISTORY: lung cancer 2008  PAIN:  Are you having pain? No  PRECAUTIONS: Fall  WEIGHT BEARING RESTRICTIONS: No  FALLS: Has patient fallen in last 6 months? No  LIVING ENVIRONMENT: Lives with: lives with their spouse Lives in: House/apartment Stairs: Yes: Internal: 13 steps; on right going up, on left going up, and can reach both and External: 1 steps; none Has following equipment at home: Single point cane, Walker - 4 wheeled, and shower chair  PLOF: Independent  PATIENT GOALS: get back to normal  OBJECTIVE:  Note: Objective measures were completed at Evaluation unless otherwise noted.  DIAGNOSTIC FINDINGS:   COGNITION: Overall cognitive status: Within functional limits for tasks assessed   SENSATION: WFL  COORDINATION: Mild decreased right side versus left   MUSCLE TONE: appears wnl  DTRs:  Not tested at eval  POSTURE: rounded shoulders and forward head  LOWER EXTREMITY ROM:     Active  Right Eval Left Eval  Hip flexion    Hip extension    Hip abduction    Hip adduction    Hip internal rotation    Hip  external rotation    Knee flexion    Knee extension    Ankle dorsiflexion    Ankle plantarflexion    Ankle inversion    Ankle eversion     (Blank rows = not tested)  LOWER EXTREMITY MMT:    MMT Right Eval Left Eval Right 09/10/23 Left 09/10/23  Hip flexion 4+ 5 5 5  Hip extension 4 4+ 4+ 4+  Hip abduction   4 4+  Hip adduction      Hip internal rotation      Hip external rotation      Knee flexion 4+ 4+ 5 5  Knee extension 5 5    Ankle dorsiflexion 4+ 5 4+ 5  Ankle plantarflexion      Ankle inversion      Ankle eversion      (Blank rows = not tested)  BED MOBILITY:  Sit to supine Modified independence Supine to sit Modified independence  TRANSFERS: Assistive device utilized: None  Sit to stand: SBA Stand to sit: SBA Chair to chair: SBA Floor: not tested  STAIRS: Level of Assistance: CGA Stair Negotiation Technique: Alternating Pattern  with Single Rail on Right Number of Stairs: 8  Height of Stairs: 7"  Comments: slow descent  GAIT: Gait pattern: decreased arm swing- Right, decreased arm swing- Left, decreased step length- Right, decreased stance time- Right, and shuffling Distance walked: 100 ft in clinic Assistive device utilized: None Level of assistance: CGA Comments: ocassional misstep and path deviation but no loss of balance noted  FUNCTIONAL TESTS:  5 times sit to stand: 17.21 sec hands on thighs  Dynamic Gait Index: 13/24 SLS unable   DGI 1. Gait level surface (2) Mild Impairment: Walks 20', uses assistive devices, slower speed, mild gait deviations. 2. Change in gait speed (2) Mild Impairment: Is able to change speed but demonstrates mild gait deviations, or not gait deviations but unable to achieve a significant change in velocity, or uses an assistive device. 3. Gait with horizontal head turns (1) Moderate Impairment: Performs head turns with moderate change in gait velocity, slows down, staggers but recovers, can continue to walk. 4.  Gait with vertical head turns 1) Moderate Impairment: Performs head turns with moderate change in gait velocity, slows down, staggers but recovers, can continue to walk. 5. Gait and pivot turn (1) Moderate Impairment: Turns slowly, requires verbal cueing, requires several small steps to catch balance following turn and stop. 6. Step over obstacle (2) Mild Impairment: Is able to step over box, but must slow down and adjust steps to clear box safely. 7. Step around obstacles (2) Mild Impairment: Is able to step around both cones, but must slow down and adjust steps to clear cones. 8. Stairs (2) Mild Impairment: Alternating feet, must use rail.  TOTAL SCORE: 13 / 24   PATIENT SURVEYS:  LEFS 46/80 57.5%  TODAY'S TREATMENT:                                                                                                                              DATE:  09/10/23:  Beginning:BP 170/79 mmHg, HR 67 bpm EOS: 160/32mmHg Functional tests 5 times sit to stand: 14.09 sec hands on thighs  Dynamic Gait Index: 15/24 SLS Rt 7", Lt 3" MMT see above DGI 1. Gait level surface (2) Mild Impairment: Walks  20', uses assistive devices, slower speed, mild gait deviations. 2. Change in gait speed (2) Mild Impairment: Is able to change speed but demonstrates mild gait deviations, or not gait deviations but unable to achieve a significant change in velocity, or uses an assistive device. 3. Gait with horizontal head turns (2) Mild Impairment: Performs head turns smoothly with slight change in gait velocity, i.e., minor disruption to smooth gait path or uses walking aid. 4. Gait with vertical head turns 1) Moderate Impairment: Performs head turns with moderate change in gait velocity, slows down, staggers but recovers, can continue to walk. 5. Gait and pivot turn (2) Mild Impairment: Pivot turns safely in > 3 seconds and stops with no loss of balance. 6. Step over obstacle (2) Mild Impairment: Is able to  step over box, but must slow down and adjust steps to clear box safely. 7. Step around obstacles (2) Mild Impairment: Is able to step around both cones, but must slow down and adjust steps to clear cones. 8. Stairs (2) Mild Impairment: Alternating feet, must use rail.  TOTAL SCORE: 15 / 24  Standing: Vector stance with 1 HHA 5x 5" Tandem stance 2x 30"  08/23/2023 152/78 left arm in sitting to begin treatment Nustep seat 10 x 10' level 5 dynamic warm up 138/78 left arm in sitting after Nustep 7" step to tap no UE assist x 10 each 7" step mini lunge no UE assist x 10 each leg 7" step ups with 1 UE assist  7" lateral step ups with 1 UE assist Heel raises 2 x 10 with 1 UE assist Toe raises 2 x 10 with  1 UE assist  BP in sitting following exercise 142/84 left arm Supine 182/90; 158/88    08/15/23 Nustep seat 10 level 6; 10 minutes UE/LE Standing: heelraises 20X  Toeraises 20X BP:  183/91; 157/90 in Rt UE   08/08/23 Nustep warm up; seat 10, level 3; 10 minutes Standing:   Heel raises  Toe raises  Tandem stance 30" X3 each LE lead; cuing for 50% weight distribution on both feet Forward step ups 7" with 1 HHA x 15 each Lateral step ups 7" with 1 HHA x 15 each Diagonal stepping x 15 each direction  Cane trail gait training    PATIENT EDUCATION: Education details: Patient educated on exam findings, POC, scope of PT, HEP, and what to expect next visit. Person educated: Patient Education method: Explanation, Demonstration, and Handouts Education comprehension: verbalized understanding, returned demonstration, verbal cues required, and tactile cues required  HOME EXERCISE PROGRAM: Access Code: [email or web address] URL: [email or web address] Date: 08/01/2023 Prepared by: [facility] - Rehab  Exercises - Sit to Stand  - 2 x daily - 7 x weekly - 1 sets - 10 reps - Standing Single Leg Stance with Counter Support  - 2 x daily - 7 x weekly - 1 sets - 5 reps - 10 sec hold  Access  Code: [email or web address] URL: [email or web address] Date: 08/08/2023 Prepared by: [facility] - Rehab  Exercises - Standing March with Counter Support  - 1 x daily - 7 x weekly - 3 sets - 10 reps - Standing Tandem Balance with Counter Support  - 1 x daily - 7 x weekly - 3 sets - 10 reps - Narrow Stance with Counter Support  - 1 x daily - 7 x weekly - 3 sets - 10 reps - Standing Single Leg Stance with Counter Support  - 1 x daily - 7 x weekly - 3 sets - 10  reps - Heel Raises with Counter Support  - 1 x daily - 7 x weekly - 2 sets - 15 reps - Mini Squat with Counter Support  - 1 x daily - 7 x weekly - 3 sets - 10 reps - Sit to Stand with Counter Support  - 1 x daily - 7 x weekly - 3 sets - 10 reps - Side Stepping with Resistance at Ankles and Counter Support  - 1 x daily - 7 x weekly - 3 sets - 10 reps - Seated Hip Abduction with Resistance  - 1 x daily - 7 x weekly - 3 sets - 10 reps - Clam with Resistance  - 1 x daily - 7 x weekly - 3 sets - 10 reps - Bridge with Hip Abduction and Resistance  - 1 x daily - 7 x weekly - 3 sets - 10 reps  GOALS: Goals reviewed with patient? No  SHORT TERM GOALS: Target date: 08/22/2023  patient will be independent with initial HEP Baseline: 09/10/23:  Completing HEP daily Goal status: MET  2.  Patient will self report 50% improvement to improve tolerance for functional activity Baseline: 09/10/23: Feels 50% improvements Goal status: MET  LONG TERM GOALS: Target date: 09/12/2023  Patient will be independent in self management strategies to improve quality of life and functional outcomes. Baseline:  Goal status: MET  2.  Patient will self report 75% improvement to improve tolerance for functional activity Baseline: Feels 50% improvements Goal status: In progress  3.  Patient will increase  leg MMTs to 5/5 without pain to promote return to ambulation community distances with minimal deviation.  Baseline: see above Goal status: In progress  4.   Patient will improve DGI score by 4 points to demonstrate improved functional balance with gait Baseline: 13/24; 09/10/23: 15/24 Goal status: In progress  5.  Patient will be able to stand SLS x 5" each leg to demonstrate improved functional balance Baseline: 0"; 09/10/23:  Rt 7", Lt 3" Goal status: In progress  6.  Patient will be able to walk community distances safely with LRAD modified independently Baseline: needs CGA for safety level surfaces; 09/10/23:  Ambulates without AD, no reports of recent falls Goal status: MET  ASSESSMENT:  CLINICAL IMPRESSION: Reviewed goals with the following findings:  Pt has met 2/2 STGs and 2/6 LTGs.  Pt arrived ambulating with no AD with no reoprts of falls.  Reports of compliance with HEP daily and feels he has improved by 50%.  Objective findings reveal improves with 5STS, DGI, SLS, and MMT though continues to be impaired with balance associated exercises.  Pt will continue to benefit from skilled physical therapy service to address this deficitis to improve level of function with ADLs, balance and reduce risk of falls,  OBJECTIVE IMPAIRMENTS: Abnormal gait, decreased activity tolerance, decreased balance, decreased knowledge of use of DME, decreased mobility, difficulty walking, decreased strength, decreased safety awareness, and impaired perceived functional ability.   ACTIVITY LIMITATIONS: carrying, lifting, bending, standing, squatting, stairs, transfers, and locomotion level  PARTICIPATION LIMITATIONS: meal prep, cleaning, driving, shopping, community activity, and yard work  [facility] POTENTIAL: Good  CLINICAL DECISION MAKING: Evolving/moderate complexity  EVALUATION COMPLEXITY: Moderate  PLAN:  PT FREQUENCY: 2x/week  PT DURATION: 6 weeks  PLANNED INTERVENTIONS: 97164- PT Re-evaluation, 97110-Therapeutic exercises, 97530- Therapeutic activity, [id number]- Neuromuscular re-education, 97535- Self Care, 16109- Manual therapy, [id number]- Gait training,  [phone]- Orthotic Fit/training, [phone]- Canalith repositioning, [id number]- Aquatic Therapy, [phone]- Splinting, Patient/Family education, Balance training, Stair training,  Taping, Dry Needling, Joint mobilization, Joint manipulation, Spinal manipulation, Spinal mobilization, Scar mobilization, and DME instructions.   PLAN FOR NEXT SESSION: continue to increase lower extremity strengthening; gait and balance. Continue to encourage patient to progress to [facility] for out of home use.  Follow up on MD appt/BP and monitor as needed.   Becky Sax, LPTA/CLT; CBIS [phone]  Juel Burrow, PTA 09/12/2023, 4:36 PM  4:36 PM, 09/12/23

## 2023-09-11 NOTE — Addendum Note (Signed)
Addended byWilhemena Durie S on: 09/11/2023 07:26 AM   Modules accepted: Orders

## 2023-09-12 ENCOUNTER — Ambulatory Visit (HOSPITAL_COMMUNITY): Payer: HMO | Admitting: Physical Therapy

## 2023-09-12 DIAGNOSIS — R262 Difficulty in walking, not elsewhere classified: Secondary | ICD-10-CM

## 2023-09-12 DIAGNOSIS — R200 Anesthesia of skin: Secondary | ICD-10-CM

## 2023-09-12 DIAGNOSIS — I63211 Cerebral infarction due to unspecified occlusion or stenosis of right vertebral arteries: Secondary | ICD-10-CM

## 2023-09-12 NOTE — Therapy (Signed)
OUTPATIENT PHYSICAL THERAPY TREATMENT   Patient Name: Jeremy Lopez MRN: [id number] DOB:04/14/1951, 72 y.o., male Today's Date: 09/12/2023   PCP: Delmar Landau, MD REFERRING PROVIDER: Horton Chin, MD  END OF SESSION:  PT End of Session - 09/12/23 1308     Visit Number 7    Number of Visits 14    Date for PT Re-Evaluation 10/08/23    Authorization Type [employer]    Progress Note Due on Visit 16   PN completed visit 6   PT Start Time 1303    PT Stop Time 1345    PT Time Calculation (min) 42 min    Equipment Utilized During Treatment Gait belt    Activity Tolerance Patient tolerated treatment well    Behavior During Therapy WFL for tasks assessed/performed             Past Medical History:  Diagnosis Date   CAD (coronary artery disease), native coronary artery    DES proximal LAD 07/2009; DES to RCA 04/2020   Chronic heart failure with preserved ejection fraction (HFpEF) (HCC)    Essential hypertension    Habitual alcohol use    Hyperlipidemia    PVC's (premature ventricular contractions)    Small cell lung cancer (HCC)    Right upper lobe 2008; no evidence of disease since treatment ended in 11/2007   Past Surgical History:  Procedure Laterality Date   CORONARY ATHERECTOMY N/A 04/26/2020   Procedure: CORONARY ATHERECTOMY;  Surgeon: Corky Crafts, MD;  Location: [facility] INVASIVE CV LAB;  Service: Cardiovascular;  Laterality: N/A;   CORONARY STENT INTERVENTION N/A 04/26/2020   Procedure: CORONARY STENT INTERVENTION;  Surgeon: Corky Crafts, MD;  Location: [facility] INVASIVE CV LAB;  Service: Cardiovascular;  Laterality: N/A;   CORONARY ULTRASOUND/IVUS N/A 04/26/2020   Procedure: Intravascular Ultrasound/IVUS;  Surgeon: Corky Crafts, MD;  Location: [facility] INVASIVE CV LAB;  Service: Cardiovascular;  Laterality: N/A;   Insertion of left subclavian Port-A-Cath  2008   LEFT HEART CATH AND CORONARY ANGIOGRAPHY N/A 07/03/2022   Procedure: LEFT HEART CATH  AND CORONARY ANGIOGRAPHY;  Surgeon: [place]az[place], Peter M, MD;  Location: [facility] INVASIVE CV LAB;  Service: Cardiovascular;  Laterality: N/A;   RIGHT/LEFT HEART CATH AND CORONARY ANGIOGRAPHY N/A 04/26/2020   Procedure: RIGHT/LEFT HEART CATH AND CORONARY ANGIOGRAPHY;  Surgeon: Corky Crafts, MD;  Location: [facility] INVASIVE CV LAB;  Service: Cardiovascular;  Laterality: N/A;   TEMPORARY PACEMAKER N/A 04/26/2020   Procedure: TEMPORARY PACEMAKER;  Surgeon: Corky Crafts, MD;  Location: [facility] INVASIVE CV LAB;  Service: Cardiovascular;  Laterality: N/A;   Video bronchoscopy and video mediastinoscopy  2008   Patient Active Problem List   Diagnosis Date Noted   Acute CVA (cerebrovascular accident) (HCC) 07/13/2023   Dizziness 07/12/2023   Cerebellar stroke, acute (HCC) 07/07/2023   CVA (cerebral vascular accident) (HCC) 07/05/2023   Headache 07/05/2023   Prediabetes 02/01/2023   CKD (chronic kidney disease) stage 2, GFR 60-89 ml/min 02/01/2023   Nausea 02/01/2023   Colon cancer screening 02/01/2023   CHF (congestive heart failure) (HCC) 01/23/2023   Numbness and tingling of both lower extremities 01/23/2023   Encounter for general adult medical examination with abnormal findings 01/23/2023   Allergic rhinitis 01/23/2022   Asthmatic bronchitis, mild persistent, uncomplicated 01/23/2022   Upper airway cough syndrome 01/23/2022   Simple chronic bronchitis (HCC) 12/28/2021   Seasonal allergies 12/28/2021   DOE (dyspnea on exertion)    Nosebleed 10/25/2014   Lung cancer (HCC) 02/05/2010  CAD (coronary artery disease), native coronary artery 02/05/2010   Mixed hyperlipidemia 08/16/2009   Essential hypertension 07/08/2009    ONSET DATE: 07/05/23  REFERRING DIAG: W10.272 (ICD-10-CM) - Cerebrovascular accident (CVA) due to stenosis of right vertebral artery (HCC)  THERAPY DIAG:  Cerebrovascular accident (CVA) due to stenosis of right vertebral artery (HCC)  Difficulty in walking, not elsewhere  classified  Bilateral leg numbness  Rationale for Evaluation and Treatment: Rehabilitation  SUBJECTIVE:                                                                                                                                                                                             SUBJECTIVE STATEMENT: Pt states his BP was normal this morning.  No symptoms to report today. Doing his HEP.   Woke up nauseated and weak on right side 07/05/23; went to hospital later that day.  After hospitalization was in inpatient rehab 7 days; d/c home 10/13; has been doing his HEP at home.  Walks in today without AD; "having a bad day today"; did not sleep well last night.  Generally walking is the most challenging thing for him at home. Right side weakness; left hand dominant Pt accompanied by: significant other wife Jeremy Lopez  PERTINENT HISTORY: lung cancer 2008  PAIN:  Are you having pain? No  PRECAUTIONS: Fall  WEIGHT BEARING RESTRICTIONS: No  FALLS: Has patient fallen in last 6 months? No  LIVING ENVIRONMENT: Lives with: lives with their spouse Lives in: House/apartment Stairs: Yes: Internal: 13 steps; on right going up, on left going up, and can reach both and External: 1 steps; none Has following equipment at home: Single point cane, Walker - 4 wheeled, and shower chair  PLOF: Independent  PATIENT GOALS: get back to normal  OBJECTIVE:  Note: Objective measures were completed at Evaluation unless otherwise noted.  DIAGNOSTIC FINDINGS:   COGNITION: Overall cognitive status: Within functional limits for tasks assessed   SENSATION: WFL  COORDINATION: Mild decreased right side versus left   MUSCLE TONE: appears wnl  DTRs:  Not tested at eval  POSTURE: rounded shoulders and forward head  LOWER EXTREMITY ROM:     Active  Right Eval Left Eval  Hip flexion    Hip extension    Hip abduction    Hip adduction    Hip internal rotation    Hip external rotation    Knee  flexion    Knee extension    Ankle dorsiflexion    Ankle plantarflexion    Ankle inversion    Ankle eversion     (Blank rows = not tested)  LOWER EXTREMITY  MMT:    MMT Right Eval Left Eval Right 09/10/23 Left 09/10/23  Hip flexion 4+ 5 5 5   Hip extension 4 4+ 4+ 4+  Hip abduction   4 4+  Hip adduction      Hip internal rotation      Hip external rotation      Knee flexion 4+ 4+ 5 5  Knee extension 5 5    Ankle dorsiflexion 4+ 5 4+ 5  Ankle plantarflexion      Ankle inversion      Ankle eversion      (Blank rows = not tested)  BED MOBILITY:  Sit to supine Modified independence Supine to sit Modified independence  TRANSFERS: Assistive device utilized: None  Sit to stand: SBA Stand to sit: SBA Chair to chair: SBA Floor: not tested  STAIRS: Level of Assistance: CGA Stair Negotiation Technique: Alternating Pattern  with Single Rail on Right Number of Stairs: 8  Height of Stairs: 7"  Comments: slow descent  GAIT: Gait pattern: decreased arm swing- Right, decreased arm swing- Left, decreased step length- Right, decreased stance time- Right, and shuffling Distance walked: 100 ft in clinic Assistive device utilized: None Level of assistance: CGA Comments: ocassional misstep and path deviation but no loss of balance noted  FUNCTIONAL TESTS:  5 times sit to stand: 17.21 sec hands on thighs  Dynamic Gait Index: 13/24 SLS unable   DGI 1. Gait level surface (2) Mild Impairment: Walks 20', uses assistive devices, slower speed, mild gait deviations. 2. Change in gait speed (2) Mild Impairment: Is able to change speed but demonstrates mild gait deviations, or not gait deviations but unable to achieve a significant change in velocity, or uses an assistive device. 3. Gait with horizontal head turns (1) Moderate Impairment: Performs head turns with moderate change in gait velocity, slows down, staggers but recovers, can continue to walk. 4. Gait with vertical head  turns 1) Moderate Impairment: Performs head turns with moderate change in gait velocity, slows down, staggers but recovers, can continue to walk. 5. Gait and pivot turn (1) Moderate Impairment: Turns slowly, requires verbal cueing, requires several small steps to catch balance following turn and stop. 6. Step over obstacle (2) Mild Impairment: Is able to step over box, but must slow down and adjust steps to clear box safely. 7. Step around obstacles (2) Mild Impairment: Is able to step around both cones, but must slow down and adjust steps to clear cones. 8. Stairs (2) Mild Impairment: Alternating feet, must use rail.  TOTAL SCORE: 13 / 24   PATIENT SURVEYS:  LEFS 46/80 57.5%  TODAY'S TREATMENT:                                                                                                                              DATE:  09/12/23 BP initially 154/94, following nustep 132/90, EOS 142/95 Nustep warm up; seat 10, level 4; 5 minutes beach Standing:  Hip abduction 15X each  Hip extension 15X each  Alternating march high holds 15X  Tandem stance 30"X 2 Rt leading, 10"X2 Lt leading 4" lateral step ups 15X each no UE assist Side stepping on 10 foot line 3RT Sit to stands 10X no UE  09/10/23:  Beginning:BP 170/79 mmHg, HR 67 bpm EOS: 160/91mmHg Functional tests 5 times sit to stand: 14.09 sec hands on thighs  Dynamic Gait Index: 15/24 SLS Rt 7", Lt 3" MMT see above DGI 1. Gait level surface (2) Mild Impairment: Walks 20', uses assistive devices, slower speed, mild gait deviations. 2. Change in gait speed (2) Mild Impairment: Is able to change speed but demonstrates mild gait deviations, or not gait deviations but unable to achieve a significant change in velocity, or uses an assistive device. 3. Gait with horizontal head turns (2) Mild Impairment: Performs head turns smoothly with slight change in gait velocity, i.e., minor disruption to smooth gait path or uses walking  aid. 4. Gait with vertical head turns 1) Moderate Impairment: Performs head turns with moderate change in gait velocity, slows down, staggers but recovers, can continue to walk. 5. Gait and pivot turn (2) Mild Impairment: Pivot turns safely in > 3 seconds and stops with no loss of balance. 6. Step over obstacle (2) Mild Impairment: Is able to step over box, but must slow down and adjust steps to clear box safely. 7. Step around obstacles (2) Mild Impairment: Is able to step around both cones, but must slow down and adjust steps to clear cones. 8. Stairs (2) Mild Impairment: Alternating feet, must use rail.  TOTAL SCORE: 15 / 24  Standing: Vector stance with 1 HHA 5x 5" Tandem stance 2x 30"  08/23/2023 152/78 left arm in sitting to begin treatment Nustep seat 10 x 10' level 5 dynamic warm up 138/78 left arm in sitting after Nustep 7" step to tap no UE assist x 10 each 7" step mini lunge no UE assist x 10 each leg 7" step ups with 1 UE assist  7" lateral step ups with 1 UE assist Heel raises 2 x 10 with 1 UE assist Toe raises 2 x 10 with  1 UE assist  BP in sitting following exercise 142/84 left arm Supine 182/90; 158/88    08/15/23 Nustep seat 10 level 6; 10 minutes UE/LE Standing: heelraises 20X  Toeraises 20X BP:  183/91; 157/90 in Rt UE    PATIENT EDUCATION: Education details: Patient educated on exam findings, POC, scope of PT, HEP, and what to expect next visit. Person educated: Patient Education method: Explanation, Demonstration, and Handouts Education comprehension: verbalized understanding, returned demonstration, verbal cues required, and tactile cues required  HOME EXERCISE PROGRAM: Access Code: [email or web address] URL: [email or web address] Date: 08/01/2023 Prepared by: [facility] - [facility]  Exercises - Sit to Stand  - 2 x daily - 7 x weekly - 1 sets - 10 reps - Standing Single Leg Stance with Counter Support  - 2 x daily - 7 x weekly - 1 sets - 5 reps - 10  sec hold  Access Code: [email or web address] URL: [email or web address] Date: 08/08/2023 Prepared by: [facility]  Exercises - Standing March with Counter Support  - 1 x daily - 7 x weekly - 3 sets - 10 reps - Standing Tandem Balance with Counter Support  - 1 x daily - 7 x weekly - 3 sets - 10 reps - Narrow Stance with Counter Support  - 1 x daily - 7 x weekly - 3 sets -  10 reps - Standing Single Leg Stance with Counter Support  - 1 x daily - 7 x weekly - 3 sets - 10 reps - Heel Raises with Counter Support  - 1 x daily - 7 x weekly - 2 sets - 15 reps - Mini Squat with Counter Support  - 1 x daily - 7 x weekly - 3 sets - 10 reps - Sit to Stand with Counter Support  - 1 x daily - 7 x weekly - 3 sets - 10 reps - Side Stepping with Resistance at Ankles and Counter Support  - 1 x daily - 7 x weekly - 3 sets - 10 reps - Seated Hip Abduction with Resistance  - 1 x daily - 7 x weekly - 3 sets - 10 reps - Clam with Resistance  - 1 x daily - 7 x weekly - 3 sets - 10 reps - Bridge with Hip Abduction and Resistance  - 1 x daily - 7 x weekly - 3 sets - 10 reps  GOALS: Goals reviewed with patient? No  SHORT TERM GOALS: Target date: 08/22/2023  patient will be independent with initial HEP Baseline: 09/10/23:  Completing HEP daily Goal status: MET  2.  Patient will self report 50% improvement to improve tolerance for functional activity Baseline: 09/10/23: Feels 50% improvements Goal status: MET  LONG TERM GOALS: Target date: 09/12/2023  Patient will be independent in self management strategies to improve quality of life and functional outcomes. Baseline:  Goal status: MET  2.  Patient will self report 75% improvement to improve tolerance for functional activity Baseline: Feels 50% improvements Goal status: In progress  3.  Patient will increase  leg MMTs to 5/5 without pain to promote return to ambulation community distances with minimal deviation.  Baseline: see above Goal status: In  progress  4.  Patient will improve DGI score by 4 points to demonstrate improved functional balance with gait Baseline: 13/24; 09/10/23: 15/24 Goal status: In progress  5.  Patient will be able to stand SLS x 5" each leg to demonstrate improved functional balance Baseline: 0"; 09/10/23:  Rt 7", Lt 3" Goal status: In progress  6.  Patient will be able to walk community distances safely with LRAD modified independently Baseline: needs CGA for safety level surfaces; 09/10/23:  Ambulates without AD, no reports of recent falls Goal status: MET  ASSESSMENT:  CLINICAL IMPRESSION: Monitored BP during session today at beginning, middle and end.  Continued with LE strengthening and stability.  Lt leading tandem still difficult to maintain greater than 10 seconds due to Rt LE weakness.  Completed side stepping without LOB.  Min fatigue noted this session with several seated rest breaks needed.    Pt will continue to benefit from skilled physical therapy service to address this deficitis to improve level of function with ADLs, balance and reduce risk of falls,  OBJECTIVE IMPAIRMENTS: Abnormal gait, decreased activity tolerance, decreased balance, decreased knowledge of use of DME, decreased mobility, difficulty walking, decreased strength, decreased safety awareness, and impaired perceived functional ability.   ACTIVITY LIMITATIONS: carrying, lifting, bending, standing, squatting, stairs, transfers, and locomotion level  PARTICIPATION LIMITATIONS: meal prep, cleaning, driving, shopping, community activity, and yard work  [facility] POTENTIAL: Good  CLINICAL DECISION MAKING: Evolving/moderate complexity  EVALUATION COMPLEXITY: Moderate  PLAN:  PT FREQUENCY: 2x/week  PT DURATION: 6 weeks  PLANNED INTERVENTIONS: 97164- PT Re-evaluation, 97110-Therapeutic exercises, 97530- Therapeutic activity, [id number]- Neuromuscular re-education, 97535- Self Care, 13086- Manual therapy, [id number]- Gait training, [id number]-  Orthotic Fit/training, 81191- Canalith repositioning, 47829- Aquatic Therapy, [id number]- Splinting, Patient/Family education, Balance training, Stair training, Taping, Dry Needling, Joint mobilization, Joint manipulation, Spinal manipulation, Spinal mobilization, Scar mobilization, and DME instructions.   PLAN FOR NEXT SESSION: continue to increase lower extremity strengthening; gait and balance. Continue to encourage patient to progress to [facility] for out of home use.  Follow up on MD appt/BP and monitor as needed.   Emeline Gins B, PTA 09/12/2023, 1:13 PM  1:13 PM, 09/12/23

## 2023-09-17 ENCOUNTER — Ambulatory Visit (HOSPITAL_COMMUNITY): Payer: HMO

## 2023-09-17 DIAGNOSIS — I63211 Cerebral infarction due to unspecified occlusion or stenosis of right vertebral arteries: Secondary | ICD-10-CM | POA: Diagnosis not present

## 2023-09-17 DIAGNOSIS — R262 Difficulty in walking, not elsewhere classified: Secondary | ICD-10-CM

## 2023-09-17 NOTE — Therapy (Addendum)
OUTPATIENT PHYSICAL THERAPY TREATMENT   Patient Name: Jeremy Lopez MRN: [id number] DOB:1951/02/25, 72 y.o., male Today's Date: 09/17/2023   PCP: Delmar Landau, MD REFERRING PROVIDER: Horton Chin, MD  END OF SESSION:  PT End of Session - 09/17/23 1259     Visit Number 8    Number of Visits 14    Date for PT Re-Evaluation 10/08/23    Authorization Type [employer]    Progress Note Due on Visit 16   PN completed visit 6   PT Start Time 1259    PT Stop Time 1345    PT Time Calculation (min) 46 min    Equipment Utilized During Treatment Gait belt    Activity Tolerance Patient tolerated treatment well    Behavior During Therapy WFL for tasks assessed/performed             Past Medical History:  Diagnosis Date   CAD (coronary artery disease), native coronary artery    DES proximal LAD 07/2009; DES to RCA 04/2020   Chronic heart failure with preserved ejection fraction (HFpEF) (HCC)    Essential hypertension    Habitual alcohol use    Hyperlipidemia    PVC's (premature ventricular contractions)    Small cell lung cancer (HCC)    Right upper lobe 2008; no evidence of disease since treatment ended in 11/2007   Past Surgical History:  Procedure Laterality Date   CORONARY ATHERECTOMY N/A 04/26/2020   Procedure: CORONARY ATHERECTOMY;  Surgeon: Corky Crafts, MD;  Location: [facility] INVASIVE CV LAB;  Service: Cardiovascular;  Laterality: N/A;   CORONARY STENT INTERVENTION N/A 04/26/2020   Procedure: CORONARY STENT INTERVENTION;  Surgeon: Corky Crafts, MD;  Location: [facility] INVASIVE CV LAB;  Service: Cardiovascular;  Laterality: N/A;   CORONARY ULTRASOUND/IVUS N/A 04/26/2020   Procedure: Intravascular Ultrasound/IVUS;  Surgeon: Corky Crafts, MD;  Location: [facility] INVASIVE CV LAB;  Service: Cardiovascular;  Laterality: N/A;   Insertion of left subclavian Port-A-Cath  2008   LEFT HEART CATH AND CORONARY ANGIOGRAPHY N/A 07/03/2022   Procedure: LEFT HEART CATH  AND CORONARY ANGIOGRAPHY;  Surgeon: [place], Peter M, MD;  Location: [facility] INVASIVE CV LAB;  Service: Cardiovascular;  Laterality: N/A;   RIGHT/LEFT HEART CATH AND CORONARY ANGIOGRAPHY N/A 04/26/2020   Procedure: RIGHT/LEFT HEART CATH AND CORONARY ANGIOGRAPHY;  Surgeon: Corky Crafts, MD;  Location: [facility] INVASIVE CV LAB;  Service: Cardiovascular;  Laterality: N/A;   TEMPORARY PACEMAKER N/A 04/26/2020   Procedure: TEMPORARY PACEMAKER;  Surgeon: Corky Crafts, MD;  Location: [facility] INVASIVE CV LAB;  Service: Cardiovascular;  Laterality: N/A;   Video bronchoscopy and video mediastinoscopy  2008   Patient Active Problem List   Diagnosis Date Noted   Acute CVA (cerebrovascular accident) (HCC) 07/13/2023   Dizziness 07/12/2023   Cerebellar stroke, acute (HCC) 07/07/2023   CVA (cerebral vascular accident) (HCC) 07/05/2023   Headache 07/05/2023   Prediabetes 02/01/2023   CKD (chronic kidney disease) stage 2, GFR 60-89 ml/min 02/01/2023   Nausea 02/01/2023   Colon cancer screening 02/01/2023   CHF (congestive heart failure) (HCC) 01/23/2023   Numbness and tingling of both lower extremities 01/23/2023   Encounter for general adult medical examination with abnormal findings 01/23/2023   Allergic rhinitis 01/23/2022   Asthmatic bronchitis, mild persistent, uncomplicated 01/23/2022   Upper airway cough syndrome 01/23/2022   Simple chronic bronchitis (HCC) 12/28/2021   Seasonal allergies 12/28/2021   DOE (dyspnea on exertion)    Nosebleed 10/25/2014   Lung cancer (HCC) 02/05/2010  CAD (coronary artery disease), native coronary artery 02/05/2010   Mixed hyperlipidemia 08/16/2009   Essential hypertension 07/08/2009    ONSET DATE: 07/05/23  REFERRING DIAG: G29.528 (ICD-10-CM) - Cerebrovascular accident (CVA) due to stenosis of right vertebral artery (HCC)  THERAPY DIAG:  Difficulty in walking, not elsewhere classified  Cerebrovascular accident (CVA) due to stenosis of right vertebral artery  (HCC)  Rationale for Evaluation and Treatment: Rehabilitation  SUBJECTIVE:                                                                                                                                                                                             SUBJECTIVE STATEMENT: Pt states his BP has been doing well recently. Has a log of the last 2 weeks. No symptoms to report today. Doing his HEP. Typically notices his balance & equilibrium is off the most in the morning; gets better as the day goes on.    Woke up nauseated and weak on right side 07/05/23; went to hospital later that day.  After hospitalization was in inpatient rehab 7 days; d/c home 10/13; has been doing his HEP at home.  Walks in today without AD; "having a bad day today"; did not sleep well last night.  Generally walking is the most challenging thing for him at home. Right side weakness; left hand dominant Pt accompanied by: significant other wife Jeremy Lopez  PERTINENT HISTORY: lung cancer 2008  PAIN:  Are you having pain? No  PRECAUTIONS: Fall  WEIGHT BEARING RESTRICTIONS: No  FALLS: Has patient fallen in last 6 months? No  LIVING ENVIRONMENT: Lives with: lives with their spouse Lives in: House/apartment Stairs: Yes: Internal: 13 steps; on right going up, on left going up, and can reach both and External: 1 steps; none Has following equipment at home: Single point cane, Walker - 4 wheeled, and shower chair  PLOF: Independent  PATIENT GOALS: get back to normal  OBJECTIVE:  Note: Objective measures were completed at Evaluation unless otherwise noted.  DIAGNOSTIC FINDINGS:   COGNITION: Overall cognitive status: Within functional limits for tasks assessed   SENSATION: WFL  COORDINATION: Mild decreased right side versus left   MUSCLE TONE: appears wnl  DTRs:  Not tested at eval  POSTURE: rounded shoulders and forward head  LOWER EXTREMITY ROM:     Active  Right Eval Left Eval  Hip flexion     Hip extension    Hip abduction    Hip adduction    Hip internal rotation    Hip external rotation    Knee flexion    Knee extension    Ankle dorsiflexion  Ankle plantarflexion    Ankle inversion    Ankle eversion     (Blank rows = not tested)  LOWER EXTREMITY MMT:    MMT Right Eval Left Eval Right 09/10/23 Left 09/10/23  Hip flexion 4+ 5 5 5   Hip extension 4 4+ 4+ 4+  Hip abduction   4 4+  Hip adduction      Hip internal rotation      Hip external rotation      Knee flexion 4+ 4+ 5 5  Knee extension 5 5    Ankle dorsiflexion 4+ 5 4+ 5  Ankle plantarflexion      Ankle inversion      Ankle eversion      (Blank rows = not tested)  BED MOBILITY:  Sit to supine Modified independence Supine to sit Modified independence  TRANSFERS: Assistive device utilized: None  Sit to stand: SBA Stand to sit: SBA Chair to chair: SBA Floor: not tested  STAIRS: Level of Assistance: CGA Stair Negotiation Technique: Alternating Pattern  with Single Rail on Right Number of Stairs: 8  Height of Stairs: 7"  Comments: slow descent  GAIT: Gait pattern: decreased arm swing- Right, decreased arm swing- Left, decreased step length- Right, decreased stance time- Right, and shuffling Distance walked: 100 ft in clinic Assistive device utilized: None Level of assistance: CGA Comments: ocassional misstep and path deviation but no loss of balance noted  FUNCTIONAL TESTS:  5 times sit to stand: 17.21 sec hands on thighs  Dynamic Gait Index: 13/24 SLS unable   DGI 1. Gait level surface (2) Mild Impairment: Walks 20', uses assistive devices, slower speed, mild gait deviations. 2. Change in gait speed (2) Mild Impairment: Is able to change speed but demonstrates mild gait deviations, or not gait deviations but unable to achieve a significant change in velocity, or uses an assistive device. 3. Gait with horizontal head turns (1) Moderate Impairment: Performs head turns with moderate  change in gait velocity, slows down, staggers but recovers, can continue to walk. 4. Gait with vertical head turns 1) Moderate Impairment: Performs head turns with moderate change in gait velocity, slows down, staggers but recovers, can continue to walk. 5. Gait and pivot turn (1) Moderate Impairment: Turns slowly, requires verbal cueing, requires several small steps to catch balance following turn and stop. 6. Step over obstacle (2) Mild Impairment: Is able to step over box, but must slow down and adjust steps to clear box safely. 7. Step around obstacles (2) Mild Impairment: Is able to step around both cones, but must slow down and adjust steps to clear cones. 8. Stairs (2) Mild Impairment: Alternating feet, must use rail.  TOTAL SCORE: 13 / 24   PATIENT SURVEYS:  LEFS 46/80 57.5%  TODAY'S TREATMENT:                                                                                                                              DATE:  09/17/23 BP initially 144/76, following nustep 152/80, EOS 148/82 Nustep warm up; seat 10, level 5; 5 minutes  Standing:  Hip abduction, 2# AW, 2 x 15 each   March to hip Ext, 2# AW, 2 x 15 each side  4" lateral step ups 2 x 15 each   Split stance with front foot elevated anterior and posterior weight shifts x 15 each side   Split stance balance with front foot on 4" step 2 x 30"    09/12/23 BP initially 154/94, following nustep 132/90, EOS 142/95 Nustep warm up; seat 10, level 4; 5 minutes beach Standing:    Hip abduction 15X each  Hip extension 15X each  Alternating march high holds 15X  Tandem stance 30"X 2 Rt leading, 10"X2 Lt leading 4" lateral step ups 15X each no UE assist Side stepping on 10 foot line 3RT Sit to stands 10X no UE  09/10/23:  Beginning:BP 170/79 mmHg, HR 67 bpm EOS: 160/51mmHg Functional tests 5 times sit to stand: 14.09 sec hands on thighs  Dynamic Gait Index: 15/24 SLS Rt 7", Lt 3" MMT see above DGI 1. Gait  level surface (2) Mild Impairment: Walks 20', uses assistive devices, slower speed, mild gait deviations. 2. Change in gait speed (2) Mild Impairment: Is able to change speed but demonstrates mild gait deviations, or not gait deviations but unable to achieve a significant change in velocity, or uses an assistive device. 3. Gait with horizontal head turns (2) Mild Impairment: Performs head turns smoothly with slight change in gait velocity, i.e., minor disruption to smooth gait path or uses walking aid. 4. Gait with vertical head turns 1) Moderate Impairment: Performs head turns with moderate change in gait velocity, slows down, staggers but recovers, can continue to walk. 5. Gait and pivot turn (2) Mild Impairment: Pivot turns safely in > 3 seconds and stops with no loss of balance. 6. Step over obstacle (2) Mild Impairment: Is able to step over box, but must slow down and adjust steps to clear box safely. 7. Step around obstacles (2) Mild Impairment: Is able to step around both cones, but must slow down and adjust steps to clear cones. 8. Stairs (2) Mild Impairment: Alternating feet, must use rail.  TOTAL SCORE: 15 / 24  Standing: Vector stance with 1 HHA 5x 5" Tandem stance 2x 30"  08/23/2023 152/78 left arm in sitting to begin treatment Nustep seat 10 x 10' level 5 dynamic warm up 138/78 left arm in sitting after Nustep 7" step to tap no UE assist x 10 each 7" step mini lunge no UE assist x 10 each leg 7" step ups with 1 UE assist  7" lateral step ups with 1 UE assist Heel raises 2 x 10 with 1 UE assist Toe raises 2 x 10 with  1 UE assist  BP in sitting following exercise 142/84 left arm Supine 182/90; 158/88    08/15/23 Nustep seat 10 level 6; 10 minutes UE/LE Standing: heelraises 20X  Toeraises 20X BP:  183/91; 157/90 in Rt UE   PATIENT EDUCATION: Education details: Patient educated on exam findings, POC, scope of PT, HEP, and what to expect next visit. Person  educated: Patient Education method: Explanation, Demonstration, and Handouts Education comprehension: verbalized understanding, returned demonstration, verbal cues required, and tactile cues required  HOME EXERCISE PROGRAM: Access Code: [email or web address] URL: [email or web address] Date: 08/01/2023 Prepared by: [facility] - Rehab  Exercises - Sit to Stand  - 2 x daily - 7 x weekly - 1  sets - 10 reps - Standing Single Leg Stance with Counter Support  - 2 x daily - 7 x weekly - 1 sets - 5 reps - 10 sec hold  Access Code: [email or web address] URL: [email or web address] Date: 08/08/2023 Prepared by: [facility] - Rehab  Exercises - Standing March with Counter Support  - 1 x daily - 7 x weekly - 3 sets - 10 reps - Standing Tandem Balance with Counter Support  - 1 x daily - 7 x weekly - 3 sets - 10 reps - Narrow Stance with Counter Support  - 1 x daily - 7 x weekly - 3 sets - 10 reps - Standing Single Leg Stance with Counter Support  - 1 x daily - 7 x weekly - 3 sets - 10 reps - Heel Raises with Counter Support  - 1 x daily - 7 x weekly - 2 sets - 15 reps - Mini Squat with Counter Support  - 1 x daily - 7 x weekly - 3 sets - 10 reps - Sit to Stand with Counter Support  - 1 x daily - 7 x weekly - 3 sets - 10 reps - Side Stepping with Resistance at Ankles and Counter Support  - 1 x daily - 7 x weekly - 3 sets - 10 reps - Seated Hip Abduction with Resistance  - 1 x daily - 7 x weekly - 3 sets - 10 reps - Clam with Resistance  - 1 x daily - 7 x weekly - 3 sets - 10 reps - Bridge with Hip Abduction and Resistance  - 1 x daily - 7 x weekly - 3 sets - 10 reps  GOALS: Goals reviewed with patient? No  SHORT TERM GOALS: Target date: 08/22/2023  patient will be independent with initial HEP Baseline: 09/10/23:  Completing HEP daily Goal status: MET  2.  Patient will self report 50% improvement to improve tolerance for functional activity Baseline: 09/10/23: Feels 50% improvements Goal status:  MET  LONG TERM GOALS: Target date: 09/12/2023  Patient will be independent in self management strategies to improve quality of life and functional outcomes. Baseline:  Goal status: MET  2.  Patient will self report 75% improvement to improve tolerance for functional activity Baseline: Feels 50% improvements Goal status: In progress  3.  Patient will increase  leg MMTs to 5/5 without pain to promote return to ambulation community distances with minimal deviation.  Baseline: see above Goal status: In progress  4.  Patient will improve DGI score by 4 points to demonstrate improved functional balance with gait Baseline: 13/24; 09/10/23: 15/24 Goal status: In progress  5.  Patient will be able to stand SLS x 5" each leg to demonstrate improved functional balance Baseline: 0"; 09/10/23:  Rt 7", Lt 3" Goal status: In progress  6.  Patient will be able to walk community distances safely with LRAD modified independently Baseline: needs CGA for safety level surfaces; 09/10/23:  Ambulates without AD, no reports of recent falls Goal status: MET  ASSESSMENT:  CLINICAL IMPRESSION: Monitored BP during session today at beginning, middle, and end. BP stayed within acceptable ranges. Continued with LE strengthening and balance exercises.  Patient did not notice any differences side to side during split stance balance activities. Initial steps demonstrate some instability when changing direction and initiating gait. Min fatigue noted this session with only requesting  seated rest break at the end.   Pt will continue to benefit from skilled physical therapy service to address this deficitis to  improve level of function with ADLs, balance and reduce risk of falls,  OBJECTIVE IMPAIRMENTS: Abnormal gait, decreased activity tolerance, decreased balance, decreased knowledge of use of DME, decreased mobility, difficulty walking, decreased strength, decreased safety awareness, and impaired perceived functional  ability.   ACTIVITY LIMITATIONS: carrying, lifting, bending, standing, squatting, stairs, transfers, and locomotion level  PARTICIPATION LIMITATIONS: meal prep, cleaning, driving, shopping, community activity, and yard work  [facility] POTENTIAL: Good  CLINICAL DECISION MAKING: Evolving/moderate complexity  EVALUATION COMPLEXITY: Moderate  PLAN:  PT FREQUENCY: 2x/week  PT DURATION: 6 weeks  PLANNED INTERVENTIONS: 97164- PT Re-evaluation, 97110-Therapeutic exercises, 97530- Therapeutic activity, 97112- Neuromuscular re-education, 97535- Self Care, 16109- Manual therapy, [id number]- Gait training, [phone]- Orthotic Fit/training, [phone]- Canalith repositioning, [id number]- Aquatic Therapy, [phone]- Splinting, Patient/Family education, Balance training, Stair training, Taping, Dry Needling, Joint mobilization, Joint manipulation, Spinal manipulation, Spinal mobilization, Scar mobilization, and DME instructions.   PLAN FOR NEXT SESSION: continue to increase lower extremity strengthening; gait and balance. Continue to encourage patient to progress to [facility] for out of home use.  Follow up on MD appt/BP and monitor as needed.   Chas Olesya Wike, Student-PT 09/17/2023, 12:59 PM  " I agree with the following treatment note after reviewing documentation. This session was performed under the supervision of a licensed clinician."  2:07 PM, 09/17/23 Amy Small Lynch MPT [place] [facility] [place] [phone]

## 2023-09-19 ENCOUNTER — Ambulatory Visit (HOSPITAL_COMMUNITY): Payer: HMO

## 2023-09-24 ENCOUNTER — Ambulatory Visit: Payer: Self-pay | Admitting: Family Medicine

## 2023-09-24 ENCOUNTER — Telehealth: Payer: HMO | Admitting: Internal Medicine

## 2023-09-24 ENCOUNTER — Encounter: Payer: Self-pay | Admitting: Internal Medicine

## 2023-09-24 ENCOUNTER — Ambulatory Visit (HOSPITAL_COMMUNITY): Payer: HMO

## 2023-09-24 VITALS — BP 143/85

## 2023-09-24 DIAGNOSIS — J208 Acute bronchitis due to other specified organisms: Secondary | ICD-10-CM | POA: Diagnosis not present

## 2023-09-24 DIAGNOSIS — B9689 Other specified bacterial agents as the cause of diseases classified elsewhere: Secondary | ICD-10-CM | POA: Insufficient documentation

## 2023-09-24 MED ORDER — AZITHROMYCIN 250 MG PO TABS
ORAL_TABLET | ORAL | 0 refills | Status: DC
Start: 2023-09-24 — End: 2023-11-01

## 2023-09-24 MED ORDER — HYDROCOD POLI-CHLORPHE POLI ER 10-8 MG/5ML PO SUER: 5.0000 mL | Freq: Two times a day (BID) | ORAL | 0 refills | Status: DC | PRN

## 2023-09-24 NOTE — Progress Notes (Signed)
Virtual Visit via Video Note  I connected with Jeremy Lopez on 09/24/23 at  9:00 AM EST by a video enabled telemedicine application and verified that I am speaking with the correct person using two identifiers.  Patient Location: Home Provider Location: Office/Clinic  I discussed the limitations, risks, security, and privacy concerns of performing an evaluation and management service by video and the availability of in person appointments. I also discussed with the patient that there may be a patient responsible charge related to this service. The patient expressed understanding and agreed to proceed.  Subjective: PCP: [name], MD  Chief Complaint  Patient presents with   Constipation    Patient complains of productive cough, bronchitis symptoms starting 5 days ago.    Jeremy Lopez has been evaluated today for an acute visit through virtual encounter endorsing a 5-day history of sinus congestion and a cough productive of green sputum.  Denies fever/chills.  He is unaware of any recent sick contacts.  Overall symptoms have worsened despite using Mucinex, DayQuil/NyQuil.  He describes experiencing similar symptoms during a recent hospital admission.  He was treated with a Z-Pak, which was effective.  ROS: Per HPI  Current Outpatient Medications:    acetaminophen (TYLENOL) 325 MG tablet, Take 1-2 tablets (325-650 mg total) by mouth every 4 (four) hours as needed for mild pain., Disp: , Rfl:    azithromycin (ZITHROMAX Z-PAK) 250 MG tablet, Take 2 tablets (500 mg) PO today, then 1 tablet (250 mg) PO daily x4 days., Disp: 6 tablet, Rfl: 0   Blood Pressure Monitoring (OMRON 3 SERIES BP MONITOR) DEVI, As directed, Disp: 1 each, Rfl: 1   chlorpheniramine-HYDROcodone (TUSSIONEX) 10-8 MG/5ML, Take 5 mLs by mouth every 12 (twelve) hours as needed for cough., Disp: 115 mL, Rfl: 0   Cholecalciferol (VITAMIN D) 50 MCG (2000 UT) tablet, Take 2,000 Units by mouth daily., Disp: , Rfl:     clopidogrel (PLAVIX) 75 MG tablet, Take 1 tablet (75 mg total) by mouth daily., Disp: 90 tablet, Rfl: 1   empagliflozin (JARDIANCE) 10 MG TABS tablet, Take 1 tablet (10 mg total) by mouth daily., Disp: 30 tablet, Rfl: 6   escitalopram (LEXAPRO) 10 MG tablet, Take 1 tablet (10 mg total) by mouth daily., Disp: 30 tablet, Rfl: 1   Fluticasone-Umeclidin-Vilant (TRELEGY ELLIPTA) 100-62.5-25 MCG/ACT AEPB, Inhale 1 puff into the lungs daily., Disp: 180 each, Rfl: 3   gabapentin (NEURONTIN) 300 MG capsule, Take 1 capsule (300 mg total) by mouth at bedtime., Disp: 90 capsule, Rfl: 3   irbesartan (AVAPRO) 300 MG tablet, Take 1 tablet (300 mg total) by mouth daily., Disp: 90 tablet, Rfl: 1   l-methylfolate-B6-B12 (METANX) 3-35-2 MG TABS tablet, Take 1 tablet by mouth daily., Disp: 30 tablet, Rfl: 0   magnesium oxide (MAG-OX) 400 MG tablet, Take 1.5 tablets (600 mg total) by mouth at bedtime., Disp: 30 tablet, Rfl: 0   metoprolol succinate (TOPROL-XL) 100 MG 24 hr tablet, Take 1 tablet (100 mg total) by mouth daily. Take with or immediately following a meal., Disp: 90 tablet, Rfl: 3   nitroGLYCERIN (NITROSTAT) 0.4 MG SL tablet, Place 1 tablet (0.4 mg total) under the tongue every 5 (five) minutes as needed for chest pain., Disp: 25 tablet, Rfl: 2   rosuvastatin (CRESTOR) 40 MG tablet, TAKE ONE (1) TABLET BY MOUTH EVERY DAY, Disp: 90 tablet, Rfl: 3   tamsulosin (FLOMAX) 0.4 MG CAPS capsule, Take 1 capsule (0.4 mg total) by mouth daily after supper.,  Disp: 90 capsule, Rfl: 3   traZODone (DESYREL) 50 MG tablet, Take 1 tablet (50 mg total) by mouth at bedtime., Disp: 90 tablet, Rfl: 1  Observations/Objective: Today's Vitals   09/24/23 0758  BP: (!) 143/85   Assessment and Plan:  Acute bacterial bronchitis Assessment & Plan: Evaluated today through virtual encounter for an acute visit endorsing a 5-day history of sinus congestion that has progressed to a cough productive of green sputum.  Current symptoms seem  most consistent with bacterial bronchitis. -Z-Pak prescribed for treatment of bacterial bronchitis -Tussionex added for cough relief since Mucinex and NyQuil/DayQuil have been ineffective -He was instructed to return to care if symptoms worsen or fail to improve.  Otherwise, he is scheduled for routine follow-up with me on 11/01/2023.  Follow Up Instructions: Return if symptoms worsen or fail to improve.   I discussed the assessment and treatment plan with the patient. The patient was provided an opportunity to ask questions, and all were answered. The patient agreed with the plan and demonstrated an understanding of the instructions.   The patient was advised to call back or seek an in-person evaluation if the symptoms worsen or if the condition fails to improve as anticipated.  The above assessment and management plan was discussed with the patient. The patient verbalized understanding of and has agreed to the management plan.   [name], MD

## 2023-09-24 NOTE — Assessment & Plan Note (Addendum)
Evaluated today through virtual encounter for an acute visit endorsing a 5-day history of sinus congestion that has progressed to a cough productive of green sputum.  Current symptoms seem most consistent with bacterial bronchitis. -Z-Pak prescribed for treatment of bacterial bronchitis -Tussionex added for cough relief since Mucinex and NyQuil/DayQuil have been ineffective -He was instructed to return to care if symptoms worsen or fail to improve.  Otherwise, he is scheduled for routine follow-up with me on 11/01/2023.

## 2023-09-26 ENCOUNTER — Ambulatory Visit (HOSPITAL_COMMUNITY): Payer: HMO

## 2023-10-21 ENCOUNTER — Telehealth: Payer: Self-pay | Admitting: Internal Medicine

## 2023-10-21 DIAGNOSIS — I639 Cerebral infarction, unspecified: Secondary | ICD-10-CM

## 2023-10-21 NOTE — Telephone Encounter (Signed)
 PT referral entered per pt's request

## 2023-10-21 NOTE — Telephone Encounter (Signed)
 Patient is requesting for [place] to send a new referral for the patient to have physical therapy due to the last referral being closed.

## 2023-10-24 NOTE — Therapy (Signed)
OUTPATIENT PHYSICAL THERAPY NEURO EVALUATION   Patient Name: Jeremy Lopez MRN: [id number] DOB:03/26/1951, 73 y.o., male Today's Date: 10/25/2023   PCP: Christel Mormon REFERRING PROVIDER: Pricilla Riffle, MD END OF SESSION:  PT End of Session - 10/25/23 0919     Visit Number 1    Number of Visits 8    Date for PT Re-Evaluation 11/22/23    Authorization Type [employer]    PT Start Time 0918    PT Stop Time 1000    PT Time Calculation (min) 42 min    Activity Tolerance Patient tolerated treatment well    Behavior During Therapy [facility] for tasks assessed/performed             Past Medical History:  Diagnosis Date   CAD (coronary artery disease), native coronary artery    DES proximal LAD 07/2009; DES to RCA 04/2020   Chronic heart failure with preserved ejection fraction (HFpEF) (HCC)    Essential hypertension    Habitual alcohol use    Hyperlipidemia    PVC's (premature ventricular contractions)    Jeremy cell lung cancer (HCC)    Right upper lobe 2008; no evidence of disease since treatment ended in 11/2007   Past Surgical History:  Procedure Laterality Date   CORONARY ATHERECTOMY N/A 04/26/2020   Procedure: CORONARY ATHERECTOMY;  Surgeon: Corky Crafts, MD;  Location: [facility] INVASIVE CV LAB;  Service: Cardiovascular;  Laterality: N/A;   CORONARY STENT INTERVENTION N/A 04/26/2020   Procedure: CORONARY STENT INTERVENTION;  Surgeon: Corky Crafts, MD;  Location: [facility] INVASIVE CV LAB;  Service: Cardiovascular;  Laterality: N/A;   CORONARY ULTRASOUND/IVUS N/A 04/26/2020   Procedure: Intravascular Ultrasound/IVUS;  Surgeon: Corky Crafts, MD;  Location: [facility] INVASIVE CV LAB;  Service: Cardiovascular;  Laterality: N/A;   Insertion of left subclavian Port-A-Cath  2008   LEFT HEART CATH AND CORONARY ANGIOGRAPHY N/A 07/03/2022   Procedure: LEFT HEART CATH AND CORONARY ANGIOGRAPHY;  Surgeon: [place], Peter M, MD;  Location: [facility] INVASIVE CV LAB;  Service:  Cardiovascular;  Laterality: N/A;   RIGHT/LEFT HEART CATH AND CORONARY ANGIOGRAPHY N/A 04/26/2020   Procedure: RIGHT/LEFT HEART CATH AND CORONARY ANGIOGRAPHY;  Surgeon: Corky Crafts, MD;  Location: [facility] INVASIVE CV LAB;  Service: Cardiovascular;  Laterality: N/A;   TEMPORARY PACEMAKER N/A 04/26/2020   Procedure: TEMPORARY PACEMAKER;  Surgeon: Corky Crafts, MD;  Location: [facility] INVASIVE CV LAB;  Service: Cardiovascular;  Laterality: N/A;   Video bronchoscopy and video mediastinoscopy  2008   Patient Active Problem List   Diagnosis Date Noted   Acute bacterial bronchitis 09/24/2023   Acute CVA (cerebrovascular accident) (HCC) 07/13/2023   Dizziness 07/12/2023   Cerebellar stroke, acute (HCC) 07/07/2023   CVA (cerebral vascular accident) (HCC) 07/05/2023   Headache 07/05/2023   Prediabetes 02/01/2023   CKD (chronic kidney disease) stage 2, GFR 60-89 ml/min 02/01/2023   Nausea 02/01/2023   Colon cancer screening 02/01/2023   CHF (congestive heart failure) (HCC) 01/23/2023   Numbness and tingling of both lower extremities 01/23/2023   Encounter for general adult medical examination with abnormal findings 01/23/2023   Allergic rhinitis 01/23/2022   Asthmatic bronchitis, mild persistent, uncomplicated 01/23/2022   Upper airway cough syndrome 01/23/2022   Simple chronic bronchitis (HCC) 12/28/2021   Seasonal allergies 12/28/2021   DOE (dyspnea on exertion)    Nosebleed 10/25/2014   Lung cancer (HCC) 02/05/2010   CAD (coronary artery disease), native coronary artery 02/05/2010   Mixed hyperlipidemia 08/16/2009   Essential  hypertension 07/08/2009    ONSET DATE: 07/05/23  REFERRING DIAG: X91.478 (ICD-10-CM) - Cerebrovascular accident (CVA) due to stenosis of right vertebral artery (HCC)  THERAPY DIAG:  Difficulty in walking, not elsewhere classified - Plan: PT plan of care cert/re-cert  Cerebrovascular accident (CVA) due to stenosis of right vertebral artery (HCC) - Plan: PT  plan of care cert/re-cert  Abnormality of gait due to impairment of balance - Plan: PT plan of care cert/re-cert  Rationale for Evaluation and Treatment: Rehabilitation  SUBJECTIVE:                                                                                                                                                                                             SUBJECTIVE STATEMENT: Here previously for outpatient PT Previously here for CVA treatment; he unfortunately had some sickness near end of his cert so got a new referral to return today.  Woke up nauseated and weak on right side 07/05/23; went to hospital later that day. After hospitalization was in inpatient rehab 7 days; d/c home 10/13; sees neurologist 2/11 [place] for spinal tap?   Right side weakness; left hand dominant ; still having some issues with balance; having some tinnitus; having some occasional issues with being able to focus his eyes with turning his head to change what he is looking at.   Pt accompanied by: self  PERTINENT HISTORY: chronic bronchiitis  PAIN:  Are you having pain? No  PRECAUTIONS: Fall     WEIGHT BEARING RESTRICTIONS: No  FALLS: Has patient fallen in last 6 months? No  PLOF: Independent  PATIENT GOALS: get back to normal  OBJECTIVE:  Note: Objective measures were completed at Evaluation unless otherwise noted.  DIAGNOSTIC FINDINGS: no new  COGNITION: Overall cognitive status: Within functional limits for tasks assessed   SENSATION: WFL  COORDINATION: Mild decreased right versus left side   MUSCLE TONE: appears wnl    POSTURE: rounded shoulders and forward head  LOWER EXTREMITY ROM:     Active  Right Eval Left Eval  Hip flexion    Hip extension    Hip abduction    Hip adduction    Hip internal rotation    Hip external rotation    Knee flexion    Knee extension    Ankle dorsiflexion    Ankle plantarflexion    Ankle inversion    Ankle eversion     (Blank  rows = not tested)  LOWER EXTREMITY MMT:    MMT Right Eval Left Eval  Hip flexion 4+ 5  Hip extension    Hip abduction    Hip adduction  Hip internal rotation    Hip external rotation    Knee flexion    Knee extension 4+ 5  Ankle dorsiflexion 4+ 5  Ankle plantarflexion    Ankle inversion    Ankle eversion    (Blank rows = not tested)   STAIRS: Level of Assistance: Modified independence Stair Negotiation Technique: Alternating Pattern  with Single Rail on Right Number of Stairs: 4  Height of Stairs: 7"  Comments: slight decreased speed  GAIT: Gait pattern: decreased stance time- Right and wide BOS Distance walked: 100 ft in clinic Assistive device utilized: None Level of assistance: Modified independence Comments: occasional misstep  FUNCTIONAL TESTS:  5 times sit to stand: 15.23 sec SLS  Right 3" and left 6" DGI 1. Gait level surface (2) Mild Impairment: Walks 20', uses assistive devices, slower speed, mild gait deviations. 2. Change in gait speed (2) Mild Impairment: Is able to change speed but demonstrates mild gait deviations, or not gait deviations but unable to achieve a significant change in velocity, or uses an assistive device. 3. Gait with horizontal head turns (1) Moderate Impairment: Performs head turns with moderate change in gait velocity, slows down, staggers but recovers, can continue to walk. 4. Gait with vertical head turns 1) Moderate Impairment: Performs head turns with moderate change in gait velocity, slows down, staggers but recovers, can continue to walk. 5. Gait and pivot turn (2) Mild Impairment: Pivot turns safely in > 3 seconds and stops with no loss of balance. 6. Step over obstacle (2) Mild Impairment: Is able to step over box, but must slow down and adjust steps to clear box safely. 7. Step around obstacles (2) Mild Impairment: Is able to step around both cones, but must slow down and adjust steps to clear cones. 8. Stairs (2)  Mild Impairment: Alternating feet, must use rail.  TOTAL SCORE: 14 / 24   PATIENT SURVEYS:  ABC scale 85.6% LEFS 72/80 90%                                                                                                                              TREATMENT DATE: 10/25/23 physical therapy evaluation and HEP instruction    PATIENT EDUCATION: Education details: Patient educated on exam findings, POC, scope of PT, HEP, and what to expect next visit. Person educated: Patient Education method: Explanation, Demonstration, and Handouts Education comprehension: verbalized understanding, returned demonstration, verbal cues required, and tactile cues required  HOME EXERCISE PROGRAM: Access Code: [email or web address] URL: [email or web address] Date: 08/01/2023 Prepared by: [facility] - Rehab   Exercises - Sit to Stand  - 2 x daily - 7 x weekly - 1 sets - 10 reps - Standing Single Leg Stance with Counter Support  - 2 x daily - 7 x weekly - 1 sets - 5 reps - 10 sec hold  GOALS: Goals reviewed with patient? No  SHORT TERM GOALS: Target date: 11/08/2023  patient will be independent with initial HEP  Baseline: Goal status: INITIAL  2.  Patient will self report 30% improvement to improve tolerance for functional activity  Baseline:  Goal status: INITIAL  LONG TERM GOALS: Target date: 11/22/2023  Patient will be independent in self management strategies to improve quality of life and functional outcomes.   Baseline:  Goal status: INITIAL  2.  Patient will self report 50% improvement to improve tolerance for functional activity  Baseline:  Goal status: INITIAL  3.  Patient will increase right leg MMT's to 5/5 to allow navigation of steps without gait deviation or loss of balance Baseline: see above Goal status: INITIAL  4.  Patient will be able to stand SLS x 10" each leg to demonstrate improved functional balance.   Baseline: see above Goal status: INITIAL  5.  Patient will  increase DGI score by 4 points to demonstrate improved balance with functional activity Baseline: 14/24 Goal status: INITIAL  ASSESSMENT:  CLINICAL IMPRESSION: Patient is a 73 y.o. male who was seen today for physical therapy evaluation and treatment for balance impairment and right leg weakness after CVA. Patient demonstrates decreased strength, balance deficits and gait abnormalities which are negatively impacting patient ability to perform ADLs and functional mobility tasks. Patient will benefit from skilled physical therapy services to address these deficits to improve level of function with ADLs, functional mobility tasks, and reduce risk for falls.    OBJECTIVE IMPAIRMENTS: Abnormal gait, decreased activity tolerance, decreased balance, decreased coordination, difficulty walking, decreased strength, and impaired perceived functional ability.   ACTIVITY LIMITATIONS: carrying, lifting, bending, standing, stairs, and locomotion level  PARTICIPATION LIMITATIONS: meal prep, cleaning, shopping, community activity, and yard work  [facility] POTENTIAL: Good  CLINICAL DECISION MAKING: Evolving/moderate complexity  EVALUATION COMPLEXITY: Moderate  PLAN:  PT FREQUENCY: 2x/week  PT DURATION: 4 weeks  PLANNED INTERVENTIONS: 97164- PT Re-evaluation, 97110-Therapeutic exercises, 97530- Therapeutic activity, 97112- Neuromuscular re-education, 97535- Self Care, 16109- Manual therapy, [id number]- Gait training, [phone]- Orthotic Fit/training, [phone]- Canalith repositioning, [id number]- Aquatic Therapy, [phone]- Splinting, Patient/Family education, Balance training, Stair training, Taping, Dry Needling, Joint mobilization, Joint manipulation, Spinal manipulation, Spinal mobilization, Scar mobilization, and DME instructions.   PLAN FOR NEXT SESSION: Review HEP and goals; gait and balance; lower extremity strengthening   10:13 AM, 10/25/23 Jeremy Lopez Jeremy Lopez MPT [facility] [place]  [phone] Ph:[phone]

## 2023-10-25 ENCOUNTER — Ambulatory Visit (HOSPITAL_COMMUNITY): Payer: HMO | Attending: Internal Medicine

## 2023-10-25 ENCOUNTER — Other Ambulatory Visit: Payer: Self-pay

## 2023-10-25 DIAGNOSIS — I639 Cerebral infarction, unspecified: Secondary | ICD-10-CM | POA: Insufficient documentation

## 2023-10-25 DIAGNOSIS — I63211 Cerebral infarction due to unspecified occlusion or stenosis of right vertebral arteries: Secondary | ICD-10-CM | POA: Insufficient documentation

## 2023-10-25 DIAGNOSIS — R262 Difficulty in walking, not elsewhere classified: Secondary | ICD-10-CM | POA: Insufficient documentation

## 2023-10-25 DIAGNOSIS — R2689 Other abnormalities of gait and mobility: Secondary | ICD-10-CM | POA: Insufficient documentation

## 2023-10-25 DIAGNOSIS — R200 Anesthesia of skin: Secondary | ICD-10-CM | POA: Diagnosis present

## 2023-10-29 ENCOUNTER — Ambulatory Visit (HOSPITAL_COMMUNITY): Payer: HMO | Admitting: Physical Therapy

## 2023-10-29 DIAGNOSIS — R2689 Other abnormalities of gait and mobility: Secondary | ICD-10-CM

## 2023-10-29 DIAGNOSIS — I63211 Cerebral infarction due to unspecified occlusion or stenosis of right vertebral arteries: Secondary | ICD-10-CM

## 2023-10-29 DIAGNOSIS — R262 Difficulty in walking, not elsewhere classified: Secondary | ICD-10-CM

## 2023-10-29 NOTE — Therapy (Signed)
OUTPATIENT PHYSICAL THERAPY TREATMENT   Patient Name: Jeremy Lopez MRN: [id number] DOB:[birth year], 73 y.o., male Today's Date: 10/29/2023   PCP: Christel Mormon REFERRING PROVIDER: Pricilla Riffle, MD END OF SESSION:  PT End of Session - 10/29/23 1513     Visit Number 2    Number of Visits 8    Date for PT Re-Evaluation 11/22/23    Authorization Type [employer]    PT Start Time 1436    PT Stop Time 1515    PT Time Calculation (min) 39 min    Activity Tolerance Patient tolerated treatment well    Behavior During Therapy [facility] for tasks assessed/performed              Past Medical History:  Diagnosis Date   CAD (coronary artery disease), native coronary artery    DES proximal LAD 07/2009; DES to RCA 04/2020   Chronic heart failure with preserved ejection fraction (HFpEF) (HCC)    Essential hypertension    Habitual alcohol use    Hyperlipidemia    PVC's (premature ventricular contractions)    Small cell lung cancer (HCC)    Right upper lobe 2008; no evidence of disease since treatment ended in 11/2007   Past Surgical History:  Procedure Laterality Date   CORONARY ATHERECTOMY N/A 04/26/2020   Procedure: CORONARY ATHERECTOMY;  Surgeon: Corky Crafts, MD;  Location: [facility] INVASIVE CV LAB;  Service: Cardiovascular;  Laterality: N/A;   CORONARY STENT INTERVENTION N/A 04/26/2020   Procedure: CORONARY STENT INTERVENTION;  Surgeon: Corky Crafts, MD;  Location: [facility] INVASIVE CV LAB;  Service: Cardiovascular;  Laterality: N/A;   CORONARY ULTRASOUND/IVUS N/A 04/26/2020   Procedure: Intravascular Ultrasound/IVUS;  Surgeon: Corky Crafts, MD;  Location: [facility] INVASIVE CV LAB;  Service: Cardiovascular;  Laterality: N/A;   Insertion of left subclavian Port-A-Cath  2008   LEFT HEART CATH AND CORONARY ANGIOGRAPHY N/A 07/03/2022   Procedure: LEFT HEART CATH AND CORONARY ANGIOGRAPHY;  Surgeon: Swaziland, Peter M, MD;  Location: [facility] INVASIVE CV LAB;  Service: Cardiovascular;   Laterality: N/A;   RIGHT/LEFT HEART CATH AND CORONARY ANGIOGRAPHY N/A 04/26/2020   Procedure: RIGHT/LEFT HEART CATH AND CORONARY ANGIOGRAPHY;  Surgeon: Corky Crafts, MD;  Location: [facility] INVASIVE CV LAB;  Service: Cardiovascular;  Laterality: N/A;   TEMPORARY PACEMAKER N/A 04/26/2020   Procedure: TEMPORARY PACEMAKER;  Surgeon: Corky Crafts, MD;  Location: [facility] INVASIVE CV LAB;  Service: Cardiovascular;  Laterality: N/A;   Video bronchoscopy and video mediastinoscopy  2008   Patient Active Problem List   Diagnosis Date Noted   Acute bacterial bronchitis 09/24/2023   Acute CVA (cerebrovascular accident) (HCC) 07/13/2023   Dizziness 07/12/2023   Cerebellar stroke, acute (HCC) 07/07/2023   CVA (cerebral vascular accident) (HCC) 07/05/2023   Headache 07/05/2023   Prediabetes 02/01/2023   CKD (chronic kidney disease) stage 2, GFR 60-89 ml/min 02/01/2023   Nausea 02/01/2023   Colon cancer screening 02/01/2023   CHF (congestive heart failure) (HCC) 01/23/2023   Numbness and tingling of both lower extremities 01/23/2023   Encounter for general adult medical examination with abnormal findings 01/23/2023   Allergic rhinitis 01/23/2022   Asthmatic bronchitis, mild persistent, uncomplicated 01/23/2022   Upper airway cough syndrome 01/23/2022   Simple chronic bronchitis (HCC) 12/28/2021   Seasonal allergies 12/28/2021   DOE (dyspnea on exertion)    Nosebleed 10/25/2014   Lung cancer (HCC) 02/05/2010   CAD (coronary artery disease), native coronary artery 02/05/2010   Mixed hyperlipidemia 08/16/2009   Essential  hypertension 07/08/2009    ONSET DATE: 07/05/23  REFERRING DIAG: Z61.096 (ICD-10-CM) - Cerebrovascular accident (CVA) due to stenosis of right vertebral artery (HCC)  THERAPY DIAG:  Difficulty in walking, not elsewhere classified  Cerebrovascular accident (CVA) due to stenosis of right vertebral artery (HCC)  Abnormality of gait due to impairment of balance  Rationale  for Evaluation and Treatment: Rehabilitation  SUBJECTIVE:                                                                                                                                                                                             SUBJECTIVE STATEMENT: Pt states he is doing well today.  Continues to monitor his BP and is doing well.   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  Woke up nauseated and weak on right side 07/05/23; went to hospital later that day. After hospitalization was in inpatient rehab 7 days; d/c home 10/13; sees neurologist 2/11 Pneumalli for spinal tap?   Right side weakness; left hand dominant ; still having some issues with balance; having some tinnitus; having some occasional issues with being able to focus his eyes with turning his head to change what he is looking at.   Pt accompanied by: self  PERTINENT HISTORY: chronic bronchiitis  PAIN:  Are you having pain? No  PRECAUTIONS: Fall     WEIGHT BEARING RESTRICTIONS: No  FALLS: Has patient fallen in last 6 months? No  PLOF: Independent  PATIENT GOALS: get back to normal  OBJECTIVE:  Note: Objective measures were completed at Evaluation unless otherwise noted.  DIAGNOSTIC FINDINGS: no new  COGNITION: Overall cognitive status: Within functional limits for tasks assessed   SENSATION: WFL  COORDINATION: Mild decreased right versus left side   MUSCLE TONE: appears wnl    POSTURE: rounded shoulders and forward head  LOWER EXTREMITY ROM:     Active  Right Eval Left Eval  Hip flexion    Hip extension    Hip abduction    Hip adduction    Hip internal rotation    Hip external rotation    Knee flexion    Knee extension    Ankle dorsiflexion    Ankle plantarflexion    Ankle inversion    Ankle eversion     (Blank rows = not tested)  LOWER  EXTREMITY MMT:    MMT Right Eval Left Eval  Hip flexion 4+ 5  Hip extension    Hip abduction    Hip adduction  Hip internal rotation    Hip external rotation    Knee flexion    Knee extension 4+ 5  Ankle dorsiflexion 4+ 5  Ankle plantarflexion    Ankle inversion    Ankle eversion    (Blank rows = not tested)   STAIRS: Level of Assistance: Modified independence Stair Negotiation Technique: Alternating Pattern  with Single Rail on Right Number of Stairs: 4  Height of Stairs: 7"  Comments: slight decreased speed  GAIT: Gait pattern: decreased stance time- Right and wide BOS Distance walked: 100 ft in clinic Assistive device utilized: None Level of assistance: Modified independence Comments: occasional misstep  FUNCTIONAL TESTS:  5 times sit to stand: 15.23 sec SLS  Right 3" and left 6" DGI 1. Gait level surface (2) Mild Impairment: Walks 20', uses assistive devices, slower speed, mild gait deviations. 2. Change in gait speed (2) Mild Impairment: Is able to change speed but demonstrates mild gait deviations, or not gait deviations but unable to achieve a significant change in velocity, or uses an assistive device. 3. Gait with horizontal head turns (1) Moderate Impairment: Performs head turns with moderate change in gait velocity, slows down, staggers but recovers, can continue to walk. 4. Gait with vertical head turns 1) Moderate Impairment: Performs head turns with moderate change in gait velocity, slows down, staggers but recovers, can continue to walk. 5. Gait and pivot turn (2) Mild Impairment: Pivot turns safely in > 3 seconds and stops with no loss of balance. 6. Step over obstacle (2) Mild Impairment: Is able to step over box, but must slow down and adjust steps to clear box safely. 7. Step around obstacles (2) Mild Impairment: Is able to step around both cones, but must slow down and adjust steps to clear cones. 8. Stairs (2) Mild Impairment: Alternating  feet, must use rail.  TOTAL SCORE: 14 / 24   PATIENT SURVEYS:  ABC scale 85.6% LEFS 72/80 90%                                                                                                                              TREATMENT DATE:  10/29/23 Standing: Heelraises 15X       Hip abduction 2 x 15 each  Hip extension 2X15 each          March 2 x 15 each side  4" forward lunges 2X15 each no UE assist          4" lateral step ups 2 x 15 each   4" forward step ups 2X15 each Nustep warm up; seat 10, level 5; 5 minutes UE/LE                       10/25/23 physical therapy evaluation and HEP instruction    PATIENT EDUCATION: Education details: Patient educated on exam findings, POC, scope of PT, HEP, and what to expect next visit. Person educated: Patient Education method: Explanation, Demonstration,  and Handouts Education comprehension: verbalized understanding, returned demonstration, verbal cues required, and tactile cues required  HOME EXERCISE PROGRAM: Access Code: [email or web address] URL: [email or web address] Date: 08/01/2023 Prepared by: [facility] - Rehab   Exercises - Sit to Stand  - 2 x daily - 7 x weekly - 1 sets - 10 reps - Standing Single Leg Stance with Counter Support  - 2 x daily - 7 x weekly - 1 sets - 5 reps - 10 sec hold  GOALS: Goals reviewed with patient? No  SHORT TERM GOALS: Target date: 11/08/2023  patient will be independent with initial HEP  Baseline: Goal status: INITIAL  2.  Patient will self report 30% improvement to improve tolerance for functional activity  Baseline:  Goal status: INITIAL  LONG TERM GOALS: Target date: 11/22/2023  Patient will be independent in self management strategies to improve quality of life and functional outcomes.   Baseline:  Goal status: INITIAL  2.  Patient will self report 50% improvement to improve tolerance for functional activity  Baseline:  Goal status: INITIAL  3.  Patient will increase right leg  MMT's to 5/5 to allow navigation of steps without gait deviation or loss of balance Baseline: see above Goal status: INITIAL  4.  Patient will be able to stand SLS x 10" each leg to demonstrate improved functional balance.   Baseline: see above Goal status: INITIAL  5.  Patient will increase DGI score by 4 points to demonstrate improved balance with functional activity Baseline: 14/24 Goal status: INITIAL  ASSESSMENT:  CLINICAL IMPRESSION: Reviewed goals and POC moving forward.  Resumed standing LE strengthening exercises today and finished on nustep.  Pt required only one seated rest break but no signs of fatigue or loss of balance today.  Pt will continue to benefit from skilled therapy to progress towards goals.   Evaluation:Patient is a 73 y.o. male who was seen today for physical therapy evaluation and treatment for balance impairment and right leg weakness after CVA. Patient demonstrates decreased strength, balance deficits and gait abnormalities which are negatively impacting patient ability to perform ADLs and functional mobility tasks. Patient will benefit from skilled physical therapy services to address these deficits to improve level of function with ADLs, functional mobility tasks, and reduce risk for falls.    OBJECTIVE IMPAIRMENTS: Abnormal gait, decreased activity tolerance, decreased balance, decreased coordination, difficulty walking, decreased strength, and impaired perceived functional ability.   ACTIVITY LIMITATIONS: carrying, lifting, bending, standing, stairs, and locomotion level  PARTICIPATION LIMITATIONS: meal prep, cleaning, shopping, community activity, and yard work  [facility] POTENTIAL: Good  CLINICAL DECISION MAKING: Evolving/moderate complexity  EVALUATION COMPLEXITY: Moderate  PLAN:  PT FREQUENCY: 2x/week  PT DURATION: 4 weeks  PLANNED INTERVENTIONS: 97164- PT Re-evaluation, 97110-Therapeutic exercises, 97530- Therapeutic activity, 97112-  Neuromuscular re-education, 97535- Self Care, 69629- Manual therapy, [id number]- Gait training, [phone]- Orthotic Fit/training, [phone]- Canalith repositioning, [id number]- Aquatic Therapy, [phone]- Splinting, Patient/Family education, Balance training, Stair training, Taping, Dry Needling, Joint mobilization, Joint manipulation, Spinal manipulation, Spinal mobilization, Scar mobilization, and DME instructions.   PLAN FOR NEXT SESSION: Progress gait and balance; lower extremity strengthening.  Increase balance challenge next session.   3:15 PM, 10/29/23 Lurena Nida, PTA/CLT [facility] Ph: [phone]

## 2023-10-31 ENCOUNTER — Ambulatory Visit (HOSPITAL_COMMUNITY): Payer: HMO | Admitting: Physical Therapy

## 2023-11-01 ENCOUNTER — Ambulatory Visit (HOSPITAL_COMMUNITY): Payer: HMO

## 2023-11-01 ENCOUNTER — Encounter: Payer: Self-pay | Admitting: Internal Medicine

## 2023-11-01 ENCOUNTER — Ambulatory Visit (INDEPENDENT_AMBULATORY_CARE_PROVIDER_SITE_OTHER): Payer: HMO | Admitting: Internal Medicine

## 2023-11-01 VITALS — BP 124/62 | HR 62 | Resp 16 | Ht 70.0 in | Wt 204.0 lb

## 2023-11-01 DIAGNOSIS — I10 Essential (primary) hypertension: Secondary | ICD-10-CM

## 2023-11-01 DIAGNOSIS — I639 Cerebral infarction, unspecified: Secondary | ICD-10-CM | POA: Diagnosis not present

## 2023-11-01 DIAGNOSIS — R2689 Other abnormalities of gait and mobility: Secondary | ICD-10-CM

## 2023-11-01 DIAGNOSIS — N182 Chronic kidney disease, stage 2 (mild): Secondary | ICD-10-CM

## 2023-11-01 DIAGNOSIS — I63211 Cerebral infarction due to unspecified occlusion or stenosis of right vertebral arteries: Secondary | ICD-10-CM

## 2023-11-01 DIAGNOSIS — I509 Heart failure, unspecified: Secondary | ICD-10-CM

## 2023-11-01 DIAGNOSIS — E782 Mixed hyperlipidemia: Secondary | ICD-10-CM

## 2023-11-01 DIAGNOSIS — R262 Difficulty in walking, not elsewhere classified: Secondary | ICD-10-CM

## 2023-11-01 DIAGNOSIS — R197 Diarrhea, unspecified: Secondary | ICD-10-CM

## 2023-11-01 DIAGNOSIS — R110 Nausea: Secondary | ICD-10-CM

## 2023-11-01 MED ORDER — DIPHENOXYLATE-ATROPINE 2.5-0.025 MG PO TABS: 1.0000 | ORAL_TABLET | Freq: Four times a day (QID) | ORAL | 0 refills | Status: AC | PRN

## 2023-11-01 MED ORDER — PROCHLORPERAZINE MALEATE 10 MG PO TABS: 10.0000 mg | ORAL_TABLET | Freq: Four times a day (QID) | ORAL | 0 refills | Status: AC | PRN

## 2023-11-01 NOTE — Assessment & Plan Note (Signed)
Renal function stable on latest labs.  He remains on ARB and SGLT2i.

## 2023-11-01 NOTE — Assessment & Plan Note (Signed)
Compazine refilled today for as needed nausea relief

## 2023-11-01 NOTE — Assessment & Plan Note (Signed)
Euvolemic on exam today.  Cardiology follow-up is scheduled for 1/31.

## 2023-11-01 NOTE — Progress Notes (Signed)
Established Patient Office Visit  Subjective   Patient ID: Jeremy Lopez, male    DOB: [name] 03, 1951  Age: 73 y.o. MRN: [id number]  Chief Complaint  Patient presents with   Follow-up   Jeremy Lopez returns to care today for routine follow-up.  He was last evaluated by me in October 2024.  Irbesartan 150 mg daily was started at that time.  No additional medication changes were made and 40-month follow-up was arranged.  In the interim, he has attended physical therapy and has seen cardiology and PM&R for follow-up.  There have otherwise been no acute interval events. Jeremy Lopez reports feeling well today.  He is asymptomatic and has no acute concerns to discuss.  Past Medical History:  Diagnosis Date   CAD (coronary artery disease), native coronary artery    DES proximal LAD 07/2009; DES to RCA 04/2020   Chronic heart failure with preserved ejection fraction (HFpEF) (HCC)    Essential hypertension    Habitual alcohol use    Hyperlipidemia    PVC's (premature ventricular contractions)    Small cell lung cancer (HCC)    Right upper lobe 2008; no evidence of disease since treatment ended in 11/2007   Past Surgical History:  Procedure Laterality Date   CORONARY ATHERECTOMY N/A 04/26/2020   Procedure: CORONARY ATHERECTOMY;  Surgeon: Corky Crafts, MD;  Location: [facility] INVASIVE CV LAB;  Service: Cardiovascular;  Laterality: N/A;   CORONARY STENT INTERVENTION N/A 04/26/2020   Procedure: CORONARY STENT INTERVENTION;  Surgeon: Corky Crafts, MD;  Location: [facility] INVASIVE CV LAB;  Service: Cardiovascular;  Laterality: N/A;   CORONARY ULTRASOUND/IVUS N/A 04/26/2020   Procedure: Intravascular Ultrasound/IVUS;  Surgeon: Corky Crafts, MD;  Location: [facility] INVASIVE CV LAB;  Service: Cardiovascular;  Laterality: N/A;   Insertion of left subclavian Port-A-Cath  2008   LEFT HEART CATH AND CORONARY ANGIOGRAPHY N/A 07/03/2022   Procedure: LEFT HEART CATH AND CORONARY ANGIOGRAPHY;  Surgeon: Swaziland,  Peter M, MD;  Location: [facility] INVASIVE CV LAB;  Service: Cardiovascular;  Laterality: N/A;   RIGHT/LEFT HEART CATH AND CORONARY ANGIOGRAPHY N/A 04/26/2020   Procedure: RIGHT/LEFT HEART CATH AND CORONARY ANGIOGRAPHY;  Surgeon: Corky Crafts, MD;  Location: [facility] INVASIVE CV LAB;  Service: Cardiovascular;  Laterality: N/A;   TEMPORARY PACEMAKER N/A 04/26/2020   Procedure: TEMPORARY PACEMAKER;  Surgeon: Corky Crafts, MD;  Location: [facility] INVASIVE CV LAB;  Service: Cardiovascular;  Laterality: N/A;   Video bronchoscopy and video mediastinoscopy  2008   Social History   Tobacco Use   Smoking status: Former    Current packs/day: 0.00    Types: Cigarettes    Start date: 10/25/1966    Quit date: 10/25/1998    Years since quitting: 25.0   Smokeless tobacco: Never  Vaping Use   Vaping status: Never Used  Substance Use Topics   Alcohol use: Yes    Alcohol/week: 14.0 standard drinks of alcohol    Types: 14 Cans of beer per week    Comment: 1 drink per day   Drug use: Not Currently    Types: Marijuana   Family History  Problem Relation Age of Onset   Cancer Father    CAD Brother    Hypertension Brother    Depression Maternal Uncle    Alcohol abuse Maternal Uncle    Allergies  Allergen Reactions   Cephalexin Anaphylaxis    throat swelling Patient said he can take amoxicillin   Amlodipine Swelling    Leg swelling  Review of Systems  Constitutional:  Negative for chills and fever.  HENT:  Negative for sore throat.   Respiratory:  Negative for cough and shortness of breath.   Cardiovascular:  Negative for chest pain, palpitations and leg swelling.  Gastrointestinal:  Negative for abdominal pain, blood in stool, constipation, diarrhea, nausea and vomiting.  Genitourinary:  Negative for dysuria and hematuria.  Musculoskeletal:  Negative for myalgias.  Skin:  Negative for itching and rash.  Neurological:  Negative for dizziness and headaches.  Psychiatric/Behavioral:  Negative for  depression and suicidal ideas.      Objective:     BP 124/62   Pulse 62   Resp 16   Ht 5\' 10"  (1.778 m)   Wt 204 lb (92.5 kg)   SpO2 96%   BMI 29.27 kg/m  BP Readings from Last 3 Encounters:  11/01/23 124/62  09/24/23 (!) 143/85  09/09/23 134/72   Physical Exam Vitals reviewed.  Constitutional:      General: He is not in acute distress.    Appearance: Normal appearance. He is obese. He is not ill-appearing.  HENT:     Head: Normocephalic and atraumatic.     Right Ear: External ear normal.     Left Ear: External ear normal.     Nose: Nose normal. No congestion or rhinorrhea.     Mouth/Throat:     Mouth: Mucous membranes are moist.     Pharynx: Oropharynx is clear.  Eyes:     General: No scleral icterus.    Extraocular Movements: Extraocular movements intact.     Conjunctiva/sclera: Conjunctivae normal.     Pupils: Pupils are equal, round, and reactive to light.  Cardiovascular:     Rate and Rhythm: Normal rate and regular rhythm.     Pulses: Normal pulses.     Heart sounds: Normal heart sounds. No murmur heard. Pulmonary:     Effort: Pulmonary effort is normal.     Breath sounds: Normal breath sounds. No wheezing, rhonchi or rales.  Abdominal:     General: Abdomen is flat. Bowel sounds are normal. There is no distension.     Palpations: Abdomen is soft.     Tenderness: There is no abdominal tenderness.  Musculoskeletal:        General: No swelling or deformity. Normal range of motion.     Cervical back: Normal range of motion.  Skin:    General: Skin is warm and dry.     Capillary Refill: Capillary refill takes less than 2 seconds.  Neurological:     General: No focal deficit present.     Mental Status: He is alert and oriented to person, place, and time.     Cranial Nerves: No cranial nerve deficit.     Sensory: No sensory deficit.     Motor: No weakness.     Coordination: Coordination abnormal.     Gait: Gait abnormal.  Psychiatric:        Mood and  Affect: Mood normal.        Behavior: Behavior normal.        Thought Content: Thought content normal.   Last CBC Lab Results  Component Value Date   WBC 6.5 07/15/2023   HGB 13.2 07/15/2023   HCT 39.6 07/15/2023   MCV 92.7 07/15/2023   MCH 30.9 07/15/2023   RDW 14.2 07/15/2023   PLT 356 07/15/2023   Last metabolic panel Lab Results  Component Value Date   GLUCOSE 78 07/15/2023   NA  133 (L) 07/15/2023   K 3.6 07/15/2023   CL 99 07/15/2023   CO2 25 07/15/2023   BUN 20 07/15/2023   CREATININE 1.19 07/15/2023   GFRNONAA >60 07/15/2023   CALCIUM 8.3 (L) 07/15/2023   PROT 6.0 (L) 07/06/2023   ALBUMIN 3.3 (L) 07/06/2023   LABGLOB 2.5 01/18/2023   AGRATIO 1.8 01/18/2023   BILITOT 0.6 07/06/2023   ALKPHOS 61 07/06/2023   AST 11 (L) 07/06/2023   ALT 13 07/06/2023   ANIONGAP 9 07/15/2023   Last lipids Lab Results  Component Value Date   CHOL 98 07/06/2023   HDL 42 07/06/2023   LDLCALC 34 07/06/2023   TRIG 109 07/06/2023   CHOLHDL 2.3 07/06/2023   Last hemoglobin A1c Lab Results  Component Value Date   HGBA1C 5.7 (H) 07/05/2023   Last thyroid functions Lab Results  Component Value Date   TSH 2.590 01/18/2023   Last vitamin D Lab Results  Component Value Date   VD25OH 64.9 08/21/2023   Last vitamin B12 and Folate Lab Results  Component Value Date   VITAMINB12 261 01/18/2023   FOLATE 13.2 01/18/2023     Assessment & Plan:   Problem List Items Addressed This Visit       Essential hypertension - Primary   Remains adequately controlled on currently prescribed antihypertensive regimen.  No medication changes are indicated today.      CHF (congestive heart failure) (HCC)   Euvolemic on exam today.  Cardiology follow-up is scheduled for 1/31.      Cerebellar stroke, acute (HCC)   Cerebellar CVA in late September 2024.  He remains on Plavix monotherapy and rosuvastatin.  He is attending physical therapy and feels that balance is gradually improving.  Seen  by PM&R last fall.  Neurology follow-up is scheduled for 2/11.      CKD (chronic kidney disease) stage 2, GFR 60-89 ml/min   Renal function stable on latest labs.  He remains on ARB and SGLT2i.       Mixed hyperlipidemia   Currently prescribed rosuvastatin 40 mg daily.  Lipid panel reflects adequate control on labs from September 2024.  Repeat lipid panel at follow-up in 6 months.      Nausea   Compazine refilled today for as needed nausea relief      Diarrhea   He endorses a history of diarrhea and has been prescribed Lomotil for as needed relief.  PDMP reviewed.  Prescription last filled in January 2024.  Refill requested today as he will be traveling out of the country next month. -Lomotil refilled today      Return in about 6 months (around 04/30/2024).   [name], MD

## 2023-11-01 NOTE — Assessment & Plan Note (Signed)
Remains adequately controlled on currently prescribed antihypertensive regimen.  No medication changes are indicated today.

## 2023-11-01 NOTE — Therapy (Signed)
OUTPATIENT PHYSICAL THERAPY TREATMENT   Patient Name: Jeremy Lopez MRN: [id number] DOB:1951/04/15, 73 y.o., male Today's Date: 11/01/2023   PCP: Christel Mormon REFERRING PROVIDER: Pricilla Riffle, MD END OF SESSION:  PT End of Session - 11/01/23 1343     Visit Number 3    Number of Visits 8    Date for PT Re-Evaluation 11/22/23    Authorization Type [employer]    PT Start Time 1344    PT Stop Time 1422    PT Time Calculation (min) 38 min    Activity Tolerance Patient tolerated treatment well    Behavior During Therapy [facility] for tasks assessed/performed              Past Medical History:  Diagnosis Date   CAD (coronary artery disease), native coronary artery    DES proximal LAD 07/2009; DES to RCA 04/2020   Chronic heart failure with preserved ejection fraction (HFpEF) (HCC)    Essential hypertension    Habitual alcohol use    Hyperlipidemia    PVC's (premature ventricular contractions)    Small cell lung cancer (HCC)    Right upper lobe 2008; no evidence of disease since treatment ended in 11/2007   Past Surgical History:  Procedure Laterality Date   CORONARY ATHERECTOMY N/A 04/26/2020   Procedure: CORONARY ATHERECTOMY;  Surgeon: Corky Crafts, MD;  Location: [facility] INVASIVE CV LAB;  Service: Cardiovascular;  Laterality: N/A;   CORONARY STENT INTERVENTION N/A 04/26/2020   Procedure: CORONARY STENT INTERVENTION;  Surgeon: Corky Crafts, MD;  Location: [facility] INVASIVE CV LAB;  Service: Cardiovascular;  Laterality: N/A;   CORONARY ULTRASOUND/IVUS N/A 04/26/2020   Procedure: Intravascular Ultrasound/IVUS;  Surgeon: Corky Crafts, MD;  Location: [facility] INVASIVE CV LAB;  Service: Cardiovascular;  Laterality: N/A;   Insertion of left subclavian Port-A-Cath  2008   LEFT HEART CATH AND CORONARY ANGIOGRAPHY N/A 07/03/2022   Procedure: LEFT HEART CATH AND CORONARY ANGIOGRAPHY;  Surgeon: [place], Peter M, MD;  Location: [facility] INVASIVE CV LAB;  Service: Cardiovascular;   Laterality: N/A;   RIGHT/LEFT HEART CATH AND CORONARY ANGIOGRAPHY N/A 04/26/2020   Procedure: RIGHT/LEFT HEART CATH AND CORONARY ANGIOGRAPHY;  Surgeon: Corky Crafts, MD;  Location: [facility] INVASIVE CV LAB;  Service: Cardiovascular;  Laterality: N/A;   TEMPORARY PACEMAKER N/A 04/26/2020   Procedure: TEMPORARY PACEMAKER;  Surgeon: Corky Crafts, MD;  Location: [facility] INVASIVE CV LAB;  Service: Cardiovascular;  Laterality: N/A;   Video bronchoscopy and video mediastinoscopy  2008   Patient Active Problem List   Diagnosis Date Noted   Diarrhea 11/01/2023   Acute CVA (cerebrovascular accident) (HCC) 07/13/2023   Dizziness 07/12/2023   Cerebellar stroke, acute (HCC) 07/07/2023   CVA (cerebral vascular accident) (HCC) 07/05/2023   Headache 07/05/2023   Prediabetes 02/01/2023   CKD (chronic kidney disease) stage 2, GFR 60-89 ml/min 02/01/2023   Nausea 02/01/2023   Colon cancer screening 02/01/2023   CHF (congestive heart failure) (HCC) 01/23/2023   Numbness and tingling of both lower extremities 01/23/2023   Encounter for general adult medical examination with abnormal findings 01/23/2023   Allergic rhinitis 01/23/2022   Asthmatic bronchitis, mild persistent, uncomplicated 01/23/2022   Upper airway cough syndrome 01/23/2022   Simple chronic bronchitis (HCC) 12/28/2021   Seasonal allergies 12/28/2021   DOE (dyspnea on exertion)    Nosebleed 10/25/2014   Lung cancer (HCC) 02/05/2010   CAD (coronary artery disease), native coronary artery 02/05/2010   Mixed hyperlipidemia 08/16/2009   Essential hypertension 07/08/2009  ONSET DATE: 07/05/23  REFERRING DIAG: Z61.096 (ICD-10-CM) - Cerebrovascular accident (CVA) due to stenosis of right vertebral artery (HCC)  THERAPY DIAG:  Difficulty in walking, not elsewhere classified  Cerebrovascular accident (CVA) due to stenosis of right vertebral artery (HCC)  Abnormality of gait due to impairment of balance  Rationale for Evaluation and  Treatment: Rehabilitation  SUBJECTIVE:                                                                                                                                                                                             SUBJECTIVE STATEMENT: Saw MD today; good report. No changes in medications. No new issues.    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  Woke up nauseated and weak on right side 07/05/23; went to hospital later that day. After hospitalization was in inpatient rehab 7 days; d/c home 10/13; sees neurologist 2/11 [place]alli for spinal tap?   Right side weakness; left hand dominant ; still having some issues with balance; having some tinnitus; having some occasional issues with being able to focus his eyes with turning his head to change what he is looking at.   Pt accompanied by: self  PERTINENT HISTORY: chronic bronchiitis  PAIN:  Are you having pain? No  PRECAUTIONS: Fall     WEIGHT BEARING RESTRICTIONS: No  FALLS: Has patient fallen in last 6 months? No  PLOF: Independent  PATIENT GOALS: get back to normal  OBJECTIVE:  Note: Objective measures were completed at Evaluation unless otherwise noted.  DIAGNOSTIC FINDINGS: no new  COGNITION: Overall cognitive status: Within functional limits for tasks assessed   SENSATION: WFL  COORDINATION: Mild decreased right versus left side   MUSCLE TONE: appears wnl    POSTURE: rounded shoulders and forward head  LOWER EXTREMITY ROM:     Active  Right Eval Left Eval  Hip flexion    Hip extension    Hip abduction    Hip adduction    Hip internal rotation    Hip external rotation    Knee flexion    Knee extension    Ankle dorsiflexion    Ankle plantarflexion    Ankle inversion    Ankle eversion     (Blank rows = not tested)  LOWER EXTREMITY MMT:    MMT  Right Eval Left Eval  Hip flexion 4+ 5  Hip extension    Hip abduction    Hip adduction    Hip internal rotation    Hip  external rotation    Knee flexion    Knee extension 4+ 5  Ankle dorsiflexion 4+ 5  Ankle plantarflexion    Ankle inversion    Ankle eversion    (Blank rows = not tested)   STAIRS: Level of Assistance: Modified independence Stair Negotiation Technique: Alternating Pattern  with Single Rail on Right Number of Stairs: 4  Height of Stairs: 7"  Comments: slight decreased speed  GAIT: Gait pattern: decreased stance time- Right and wide BOS Distance walked: 100 ft in clinic Assistive device utilized: None Level of assistance: Modified independence Comments: occasional misstep  FUNCTIONAL TESTS:  5 times sit to stand: 15.23 sec SLS  Right 3" and left 6" DGI 1. Gait level surface (2) Mild Impairment: Walks 20', uses assistive devices, slower speed, mild gait deviations. 2. Change in gait speed (2) Mild Impairment: Is able to change speed but demonstrates mild gait deviations, or not gait deviations but unable to achieve a significant change in velocity, or uses an assistive device. 3. Gait with horizontal head turns (1) Moderate Impairment: Performs head turns with moderate change in gait velocity, slows down, staggers but recovers, can continue to walk. 4. Gait with vertical head turns 1) Moderate Impairment: Performs head turns with moderate change in gait velocity, slows down, staggers but recovers, can continue to walk. 5. Gait and pivot turn (2) Mild Impairment: Pivot turns safely in > 3 seconds and stops with no loss of balance. 6. Step over obstacle (2) Mild Impairment: Is able to step over box, but must slow down and adjust steps to clear box safely. 7. Step around obstacles (2) Mild Impairment: Is able to step around both cones, but must slow down and adjust steps to clear cones. 8. Stairs (2) Mild Impairment: Alternating feet, must use  rail.  TOTAL SCORE: 14 / 24   PATIENT SURVEYS:  ABC scale 85.6% LEFS 72/80 90%                                                                                                                              TREATMENT DATE:  11/01/23 Standing: Heel raises on incline x 20 Toe raises on decline x 20 Hip vectors x 10 each side Sit to stand with tidal tank x 10 Foam beam tandem stance 2 x 30" each 6" step up no UE assist x 10 each Air squats 2 x 10 Nustep warm up; seat 10, level 5; 5 minutes UE/LE               10/29/23 Standing: Heelraises 15X       Hip abduction 2 x 15 each  Hip extension 2X15 each          March 2 x 15 each side  4" forward lunges 2X15 each no UE assist          4" lateral step ups 2 x 15 each   4" forward step ups 2X15 each Nustep warm  up; seat 10, level 5; 5 minutes UE/LE                       10/25/23 physical therapy evaluation and HEP instruction    PATIENT EDUCATION: Education details: Patient educated on exam findings, POC, scope of PT, HEP, and what to expect next visit. Person educated: Patient Education method: Explanation, Demonstration, and Handouts Education comprehension: verbalized understanding, returned demonstration, verbal cues required, and tactile cues required  HOME EXERCISE PROGRAM: Access Code: [email or web address] URL: [email or web address] Date: 08/01/2023 Prepared by: [facility] - Rehab   Exercises - Sit to Stand  - 2 x daily - 7 x weekly - 1 sets - 10 reps - Standing Single Leg Stance with Counter Support  - 2 x daily - 7 x weekly - 1 sets - 5 reps - 10 sec hold  GOALS: Goals reviewed with patient? No  SHORT TERM GOALS: Target date: 11/08/2023  patient will be independent with initial HEP  Baseline: Goal status: INITIAL  2.  Patient will self report 30% improvement to improve tolerance for functional activity  Baseline:  Goal status: INITIAL  LONG TERM GOALS: Target date: 11/22/2023  Patient will be independent in  self management strategies to improve quality of life and functional outcomes.   Baseline:  Goal status: INITIAL  2.  Patient will self report 50% improvement to improve tolerance for functional activity  Baseline:  Goal status: INITIAL  3.  Patient will increase right leg MMT's to 5/5 to allow navigation of steps without gait deviation or loss of balance Baseline: see above Goal status: INITIAL  4.  Patient will be able to stand SLS x 10" each leg to demonstrate improved functional balance.   Baseline: see above Goal status: INITIAL  5.  Patient will increase DGI score by 4 points to demonstrate improved balance with functional activity Baseline: 14/24 Goal status: INITIAL  ASSESSMENT:  CLINICAL IMPRESSION: Continued focus on lower extremity strengthening and balance; good challenge with foam beam tandem stance today.  Added tidal tank for pertubation with sit to stand.  Overall demonstrates improving mobility and balance; noted smoother gait pattern with walking in the gym; less frequency of catching his toes with walking.    Pt will continue to benefit from skilled therapy to progress towards goals.   Evaluation:Patient is a 73 y.o. male who was seen today for physical therapy evaluation and treatment for balance impairment and right leg weakness after CVA. Patient demonstrates decreased strength, balance deficits and gait abnormalities which are negatively impacting patient ability to perform ADLs and functional mobility tasks. Patient will benefit from skilled physical therapy services to address these deficits to improve level of function with ADLs, functional mobility tasks, and reduce risk for falls.    OBJECTIVE IMPAIRMENTS: Abnormal gait, decreased activity tolerance, decreased balance, decreased coordination, difficulty walking, decreased strength, and impaired perceived functional ability.   ACTIVITY LIMITATIONS: carrying, lifting, bending, standing, stairs, and  locomotion level  PARTICIPATION LIMITATIONS: meal prep, cleaning, shopping, community activity, and yard work  [facility] POTENTIAL: Good  CLINICAL DECISION MAKING: Evolving/moderate complexity  EVALUATION COMPLEXITY: Moderate  PLAN:  PT FREQUENCY: 2x/week  PT DURATION: 4 weeks  PLANNED INTERVENTIONS: 97164- PT Re-evaluation, 97110-Therapeutic exercises, 97530- Therapeutic activity, 97112- Neuromuscular re-education, 97535- Self Care, 16109- Manual therapy, [phone]- Gait training, [phone]- Orthotic Fit/training, [phone]- Canalith repositioning, [id number]- Aquatic Therapy, [phone]- Splinting, Patient/Family education, Balance training, Stair training, Taping, Dry Needling, Joint mobilization, Joint manipulation, Spinal manipulation, Spinal mobilization, Scar mobilization, and  DME instructions.   PLAN FOR NEXT SESSION: Progress gait and balance; lower extremity strengthening.  Increase balance challenge next session. Add BOSU ball lunges   2:30 PM, 11/01/23 Jeremy Lopez Small Evonne Rinks MPT [place] [facility] [place] [phone]

## 2023-11-01 NOTE — Assessment & Plan Note (Signed)
Cerebellar CVA in late September 2024.  He remains on Plavix monotherapy and rosuvastatin.  He is attending physical therapy and feels that balance is gradually improving.  Seen by PM&R last fall.  Neurology follow-up is scheduled for 2/11.

## 2023-11-01 NOTE — Patient Instructions (Signed)
It was a pleasure to see you today.  Thank you for giving [place] the opportunity to be involved in your care.  Below is a brief recap of your visit and next steps.  We will plan to see you again in 6 months.  Summary No medication changes today Refills provided Follow up in 6 months

## 2023-11-01 NOTE — Assessment & Plan Note (Signed)
He endorses a history of diarrhea and has been prescribed Lomotil for as needed relief.  PDMP reviewed.  Prescription last filled in January 2024.  Refill requested today as he will be traveling out of the country next month. -Lomotil refilled today

## 2023-11-01 NOTE — Assessment & Plan Note (Signed)
Currently prescribed rosuvastatin 40 mg daily.  Lipid panel reflects adequate control on labs from September 2024.  Repeat lipid panel at follow-up in 6 months.

## 2023-11-05 ENCOUNTER — Ambulatory Visit (HOSPITAL_COMMUNITY): Payer: HMO

## 2023-11-05 ENCOUNTER — Encounter (HOSPITAL_COMMUNITY): Payer: Self-pay

## 2023-11-05 DIAGNOSIS — R262 Difficulty in walking, not elsewhere classified: Secondary | ICD-10-CM

## 2023-11-05 DIAGNOSIS — I639 Cerebral infarction, unspecified: Secondary | ICD-10-CM

## 2023-11-05 DIAGNOSIS — I63211 Cerebral infarction due to unspecified occlusion or stenosis of right vertebral arteries: Secondary | ICD-10-CM

## 2023-11-05 DIAGNOSIS — R2689 Other abnormalities of gait and mobility: Secondary | ICD-10-CM

## 2023-11-05 DIAGNOSIS — R200 Anesthesia of skin: Secondary | ICD-10-CM

## 2023-11-05 NOTE — Therapy (Signed)
OUTPATIENT PHYSICAL THERAPY TREATMENT   Patient Name: Jeremy Lopez MRN: [id number] DOB:[birth year], 73 y.o., male Today's Date: 11/05/2023   PCP: Christel Mormon REFERRING PROVIDER: Pricilla Riffle, MD END OF SESSION:  PT End of Session - 11/05/23 1344     Visit Number 4    Number of Visits 8    Date for PT Re-Evaluation 11/22/23    Authorization Type [employer]    Progress Note Due on Visit 10              Past Medical History:  Diagnosis Date   CAD (coronary artery disease), native coronary artery    DES proximal LAD 07/2009; DES to RCA 04/2020   Chronic heart failure with preserved ejection fraction (HFpEF) (HCC)    Essential hypertension    Habitual alcohol use    Hyperlipidemia    PVC's (premature ventricular contractions)    Small cell lung cancer (HCC)    Right upper lobe 2008; no evidence of disease since treatment ended in 11/2007   Past Surgical History:  Procedure Laterality Date   CORONARY ATHERECTOMY N/A 04/26/2020   Procedure: CORONARY ATHERECTOMY;  Surgeon: Corky Crafts, MD;  Location: [facility] INVASIVE CV LAB;  Service: Cardiovascular;  Laterality: N/A;   CORONARY STENT INTERVENTION N/A 04/26/2020   Procedure: CORONARY STENT INTERVENTION;  Surgeon: Corky Crafts, MD;  Location: [facility] INVASIVE CV LAB;  Service: Cardiovascular;  Laterality: N/A;   CORONARY ULTRASOUND/IVUS N/A 04/26/2020   Procedure: Intravascular Ultrasound/IVUS;  Surgeon: Corky Crafts, MD;  Location: [facility] INVASIVE CV LAB;  Service: Cardiovascular;  Laterality: N/A;   Insertion of left subclavian Port-A-Cath  2008   LEFT HEART CATH AND CORONARY ANGIOGRAPHY N/A 07/03/2022   Procedure: LEFT HEART CATH AND CORONARY ANGIOGRAPHY;  Surgeon: [place], Peter M, MD;  Location: [facility] INVASIVE CV LAB;  Service: Cardiovascular;  Laterality: N/A;   RIGHT/LEFT HEART CATH AND CORONARY ANGIOGRAPHY N/A 04/26/2020   Procedure: RIGHT/LEFT HEART CATH AND CORONARY ANGIOGRAPHY;  Surgeon: Corky Crafts, MD;  Location: [facility] INVASIVE CV LAB;  Service: Cardiovascular;  Laterality: N/A;   TEMPORARY PACEMAKER N/A 04/26/2020   Procedure: TEMPORARY PACEMAKER;  Surgeon: Corky Crafts, MD;  Location: [facility] INVASIVE CV LAB;  Service: Cardiovascular;  Laterality: N/A;   Video bronchoscopy and video mediastinoscopy  2008   Patient Active Problem List   Diagnosis Date Noted   Diarrhea 11/01/2023   Acute CVA (cerebrovascular accident) (HCC) 07/13/2023   Dizziness 07/12/2023   Cerebellar stroke, acute (HCC) 07/07/2023   CVA (cerebral vascular accident) (HCC) 07/05/2023   Headache 07/05/2023   Prediabetes 02/01/2023   CKD (chronic kidney disease) stage 2, GFR 60-89 ml/min 02/01/2023   Nausea 02/01/2023   Colon cancer screening 02/01/2023   CHF (congestive heart failure) (HCC) 01/23/2023   Numbness and tingling of both lower extremities 01/23/2023   Encounter for general adult medical examination with abnormal findings 01/23/2023   Allergic rhinitis 01/23/2022   Asthmatic bronchitis, mild persistent, uncomplicated 01/23/2022   Upper airway cough syndrome 01/23/2022   Simple chronic bronchitis (HCC) 12/28/2021   Seasonal allergies 12/28/2021   DOE (dyspnea on exertion)    Nosebleed 10/25/2014   Lung cancer (HCC) 02/05/2010   CAD (coronary artery disease), native coronary artery 02/05/2010   Mixed hyperlipidemia 08/16/2009   Essential hypertension 07/08/2009    ONSET DATE: 07/05/23  REFERRING DIAG: M01.027 (ICD-10-CM) - Cerebrovascular accident (CVA) due to stenosis of right vertebral artery (HCC)  THERAPY DIAG:  Difficulty in walking, not elsewhere classified  Cerebrovascular accident (CVA) due to stenosis of right vertebral artery (HCC)  Abnormality of gait due to impairment of balance  Bilateral leg numbness  Other abnormalities of gait and mobility  Acute CVA (cerebrovascular accident) [facility])  Rationale for Evaluation and Treatment: Rehabilitation  SUBJECTIVE:                                                                                                                                                                                              SUBJECTIVE STATEMENT: Arrived stated he is feeling good today.  Stated the batteries are dead with BP machines at home, requested a BP measurement at beginning of session.  Stated he took BP medication about an hour ago.  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  Woke up nauseated and weak on right side 07/05/23; went to hospital later that day. After hospitalization was in inpatient rehab 7 days; d/c home 10/13; sees neurologist 2/11 [place] for spinal tap?   Right side weakness; left hand dominant ; still having some issues with balance; having some tinnitus; having some occasional issues with being able to focus his eyes with turning his head to change what he is looking at.   Pt accompanied by: self  PERTINENT HISTORY: chronic bronchiitis  PAIN:  Are you having pain? No  PRECAUTIONS: Fall     WEIGHT BEARING RESTRICTIONS: No  FALLS: Has patient fallen in last 6 months? No  PLOF: Independent  PATIENT GOALS: get back to normal  OBJECTIVE:  Note: Objective measures were completed at Evaluation unless otherwise noted.  DIAGNOSTIC FINDINGS: no new  COGNITION: Overall cognitive status: Within functional limits for tasks assessed   SENSATION: WFL  COORDINATION: Mild decreased right versus left side   MUSCLE TONE: appears wnl    POSTURE: rounded shoulders and forward head  LOWER EXTREMITY ROM:     Active  Right Eval Left Eval  Hip flexion    Hip extension    Hip abduction    Hip adduction    Hip internal rotation    Hip external rotation    Knee flexion    Knee extension    Ankle dorsiflexion    Ankle plantarflexion    Ankle inversion    Ankle  eversion     (Blank rows = not tested)  LOWER EXTREMITY MMT:    MMT Right Eval Left Eval  Hip flexion 4+ 5  Hip extension    Hip abduction    Hip adduction  Hip internal rotation    Hip external rotation    Knee flexion    Knee extension 4+ 5  Ankle dorsiflexion 4+ 5  Ankle plantarflexion    Ankle inversion    Ankle eversion    (Blank rows = not tested)   STAIRS: Level of Assistance: Modified independence Stair Negotiation Technique: Alternating Pattern  with Single Rail on Right Number of Stairs: 4  Height of Stairs: 7"  Comments: slight decreased speed  GAIT: Gait pattern: decreased stance time- Right and wide BOS Distance walked: 100 ft in clinic Assistive device utilized: None Level of assistance: Modified independence Comments: occasional misstep  FUNCTIONAL TESTS:  5 times sit to stand: 15.23 sec SLS  Right 3" and left 6" DGI 1. Gait level surface (2) Mild Impairment: Walks 20', uses assistive devices, slower speed, mild gait deviations. 2. Change in gait speed (2) Mild Impairment: Is able to change speed but demonstrates mild gait deviations, or not gait deviations but unable to achieve a significant change in velocity, or uses an assistive device. 3. Gait with horizontal head turns (1) Moderate Impairment: Performs head turns with moderate change in gait velocity, slows down, staggers but recovers, can continue to walk. 4. Gait with vertical head turns 1) Moderate Impairment: Performs head turns with moderate change in gait velocity, slows down, staggers but recovers, can continue to walk. 5. Gait and pivot turn (2) Mild Impairment: Pivot turns safely in > 3 seconds and stops with no loss of balance. 6. Step over obstacle (2) Mild Impairment: Is able to step over box, but must slow down and adjust steps to clear box safely. 7. Step around obstacles (2) Mild Impairment: Is able to step around both cones, but must slow down and adjust steps to clear  cones. 8. Stairs (2) Mild Impairment: Alternating feet, must use rail.  TOTAL SCORE: 14 / 24   PATIENT SURVEYS:  ABC scale 85.6% LEFS 72/80 90%                                                                                                                              TREATMENT DATE:  11/05/23 Seated: 162/85 mmHg, 88 bpm   STS to heel raise 10x  Lunges onto BOSU 10x each Step up/power up alternating on BOSU 10x each Squat 2x 10 BOSU (flatside up) Vector stance 5x 5" Bodycraft 2Pl 3RT Nustep x 5' [place] trail L6 resistance, 70 spm  11/01/23 Standing: Heel raises on incline x 20 Toe raises on decline x 20 Hip vectors x 10 each side Sit to stand with tidal tank x 10 Foam beam tandem stance 2 x 30" each 6" step up no UE assist x 10 each Air squats 2 x 10 Nustep warm up; seat 10, level 5; 5 minutes UE/LE               10/29/23 Standing: Heelraises 15X       Hip abduction 2 x 15  each  Hip extension 2X15 each          March 2 x 15 each side  4" forward lunges 2X15 each no UE assist          4" lateral step ups 2 x 15 each   4" forward step ups 2X15 each Nustep warm up; seat 10, level 5; 5 minutes UE/LE                       10/25/23 physical therapy evaluation and HEP instruction    PATIENT EDUCATION: Education details: Patient educated on exam findings, POC, scope of PT, HEP, and what to expect next visit. Person educated: Patient Education method: Explanation, Demonstration, and Handouts Education comprehension: verbalized understanding, returned demonstration, verbal cues required, and tactile cues required  HOME EXERCISE PROGRAM: Access Code: [email or web address] URL: [email or web address] Date: 08/01/2023 Prepared by: [facility] - Rehab   Exercises - Sit to Stand  - 2 x daily - 7 x weekly - 1 sets - 10 reps - Standing Single Leg Stance with Counter Support  - 2 x daily - 7 x weekly - 1 sets - 5 reps - 10 sec hold  GOALS: Goals reviewed with patient?  No  SHORT TERM GOALS: Target date: 11/08/2023  patient will be independent with initial HEP  Baseline: Goal status: INITIAL  2.  Patient will self report 30% improvement to improve tolerance for functional activity  Baseline:  Goal status: INITIAL  LONG TERM GOALS: Target date: 11/22/2023  Patient will be independent in self management strategies to improve quality of life and functional outcomes.   Baseline:  Goal status: INITIAL  2.  Patient will self report 50% improvement to improve tolerance for functional activity  Baseline:  Goal status: INITIAL  3.  Patient will increase right leg MMT's to 5/5 to allow navigation of steps without gait deviation or loss of balance Baseline: see above Goal status: INITIAL  4.  Patient will be able to stand SLS x 10" each leg to demonstrate improved functional balance.   Baseline: see above Goal status: INITIAL  5.  Patient will increase DGI score by 4 points to demonstrate improved balance with functional activity Baseline: 14/24 Goal status: INITIAL  ASSESSMENT:  CLINICAL IMPRESSION: Session focus with LE strengthening and balance training.  Added dynamic surfaces with functional strengthening exercises and Bodycraft walkout for hip stability and balance training.  Pt tolerated well to session with no reports of pain.  SBA for safety with new exercises and did required intermittent HHA on dynamic surface.  Evaluation:Patient is a 73 y.o. male who was seen today for physical therapy evaluation and treatment for balance impairment and right leg weakness after CVA. Patient demonstrates decreased strength, balance deficits and gait abnormalities which are negatively impacting patient ability to perform ADLs and functional mobility tasks. Patient will benefit from skilled physical therapy services to address these deficits to improve level of function with ADLs, functional mobility tasks, and reduce risk for falls.    OBJECTIVE  IMPAIRMENTS: Abnormal gait, decreased activity tolerance, decreased balance, decreased coordination, difficulty walking, decreased strength, and impaired perceived functional ability.   ACTIVITY LIMITATIONS: carrying, lifting, bending, standing, stairs, and locomotion level  PARTICIPATION LIMITATIONS: meal prep, cleaning, shopping, community activity, and yard work  [facility] POTENTIAL: Good  CLINICAL DECISION MAKING: Evolving/moderate complexity  EVALUATION COMPLEXITY: Moderate  PLAN:  PT FREQUENCY: 2x/week  PT DURATION: 4 weeks  PLANNED INTERVENTIONS: 97164- PT Re-evaluation, 97110-Therapeutic exercises, 97530- Therapeutic activity, 97112-  Neuromuscular re-education, [phone]- Self Care, 78295- Manual therapy, [phone]- Gait training, [phone]- Orthotic Fit/training, [phone]- Canalith repositioning, [id number]- Aquatic Therapy, [phone]- Splinting, Patient/Family education, Balance training, Stair training, Taping, Dry Needling, Joint mobilization, Joint manipulation, Spinal manipulation, Spinal mobilization, Scar mobilization, and DME instructions.   PLAN FOR NEXT SESSION: Progress gait and balance; lower extremity strengthening.  Increase balance challenge next session. Continue with BOSU ball lunges  Becky Sax, LPTA/CLT; CBIS [phone]  1:45 PM, 11/05/23

## 2023-11-07 ENCOUNTER — Ambulatory Visit (HOSPITAL_COMMUNITY): Payer: HMO | Admitting: Physical Therapy

## 2023-11-07 DIAGNOSIS — R2689 Other abnormalities of gait and mobility: Secondary | ICD-10-CM

## 2023-11-07 DIAGNOSIS — R262 Difficulty in walking, not elsewhere classified: Secondary | ICD-10-CM

## 2023-11-07 DIAGNOSIS — I63211 Cerebral infarction due to unspecified occlusion or stenosis of right vertebral arteries: Secondary | ICD-10-CM

## 2023-11-07 NOTE — Therapy (Signed)
OUTPATIENT PHYSICAL THERAPY TREATMENT   Patient Name: Jeremy Lopez MRN: [id number] DOB:08/27/1951, 73 y.o., male Today's Date: 11/07/2023   PCP: Christel Mormon REFERRING PROVIDER: Pricilla Riffle, MD END OF SESSION:  PT End of Session - 11/07/23 1439     Visit Number 5    Number of Visits 8    Date for PT Re-Evaluation 11/22/23    Authorization Type [employer]    Progress Note Due on Visit 10    PT Start Time 1436    PT Stop Time 1515    PT Time Calculation (min) 39 min    Equipment Utilized During Treatment --   // bars, SBA during Bodycraft machine   Activity Tolerance Patient tolerated treatment well    Behavior During Therapy WFL for tasks assessed/performed              Past Medical History:  Diagnosis Date   CAD (coronary artery disease), native coronary artery    DES proximal LAD 07/2009; DES to RCA 04/2020   Chronic heart failure with preserved ejection fraction (HFpEF) (HCC)    Essential hypertension    Habitual alcohol use    Hyperlipidemia    PVC's (premature ventricular contractions)    Small cell lung cancer (HCC)    Right upper lobe 2008; no evidence of disease since treatment ended in 11/2007   Past Surgical History:  Procedure Laterality Date   CORONARY ATHERECTOMY N/A 04/26/2020   Procedure: CORONARY ATHERECTOMY;  Surgeon: Corky Crafts, MD;  Location: [facility] INVASIVE CV LAB;  Service: Cardiovascular;  Laterality: N/A;   CORONARY STENT INTERVENTION N/A 04/26/2020   Procedure: CORONARY STENT INTERVENTION;  Surgeon: Corky Crafts, MD;  Location: [facility] INVASIVE CV LAB;  Service: Cardiovascular;  Laterality: N/A;   CORONARY ULTRASOUND/IVUS N/A 04/26/2020   Procedure: Intravascular Ultrasound/IVUS;  Surgeon: Corky Crafts, MD;  Location: [facility] INVASIVE CV LAB;  Service: Cardiovascular;  Laterality: N/A;   Insertion of left subclavian Port-A-Cath  2008   LEFT HEART CATH AND CORONARY ANGIOGRAPHY N/A 07/03/2022   Procedure: LEFT HEART CATH  AND CORONARY ANGIOGRAPHY;  Surgeon: [place], Peter M, MD;  Location: [facility] INVASIVE CV LAB;  Service: Cardiovascular;  Laterality: N/A;   RIGHT/LEFT HEART CATH AND CORONARY ANGIOGRAPHY N/A 04/26/2020   Procedure: RIGHT/LEFT HEART CATH AND CORONARY ANGIOGRAPHY;  Surgeon: Corky Crafts, MD;  Location: [facility] INVASIVE CV LAB;  Service: Cardiovascular;  Laterality: N/A;   TEMPORARY PACEMAKER N/A 04/26/2020   Procedure: TEMPORARY PACEMAKER;  Surgeon: Corky Crafts, MD;  Location: [facility] INVASIVE CV LAB;  Service: Cardiovascular;  Laterality: N/A;   Video bronchoscopy and video mediastinoscopy  2008   Patient Active Problem List   Diagnosis Date Noted   Diarrhea 11/01/2023   Acute CVA (cerebrovascular accident) (HCC) 07/13/2023   Dizziness 07/12/2023   Cerebellar stroke, acute (HCC) 07/07/2023   CVA (cerebral vascular accident) (HCC) 07/05/2023   Headache 07/05/2023   Prediabetes 02/01/2023   CKD (chronic kidney disease) stage 2, GFR 60-89 ml/min 02/01/2023   Nausea 02/01/2023   Colon cancer screening 02/01/2023   CHF (congestive heart failure) (HCC) 01/23/2023   Numbness and tingling of both lower extremities 01/23/2023   Encounter for general adult medical examination with abnormal findings 01/23/2023   Allergic rhinitis 01/23/2022   Asthmatic bronchitis, mild persistent, uncomplicated 01/23/2022   Upper airway cough syndrome 01/23/2022   Simple chronic bronchitis (HCC) 12/28/2021   Seasonal allergies 12/28/2021   DOE (dyspnea on exertion)    Nosebleed 10/25/2014  Lung cancer (HCC) 02/05/2010   CAD (coronary artery disease), native coronary artery 02/05/2010   Mixed hyperlipidemia 08/16/2009   Essential hypertension 07/08/2009    ONSET DATE: 07/05/23  REFERRING DIAG: W10.932 (ICD-10-CM) - Cerebrovascular accident (CVA) due to stenosis of right vertebral artery (HCC)  THERAPY DIAG:  Difficulty in walking, not elsewhere classified  Cerebrovascular accident (CVA) due to stenosis of  right vertebral artery (HCC)  Abnormality of gait due to impairment of balance  Rationale for Evaluation and Treatment: Rehabilitation  SUBJECTIVE:                                                                                                                                                                                             SUBJECTIVE STATEMENT: Pt states his BP has been good, overall doing well.   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  Woke up nauseated and weak on right side 07/05/23; went to hospital later that day. After hospitalization was in inpatient rehab 7 days; d/c home 10/13; sees neurologist 2/11 [place]alli for spinal tap?   Right side weakness; left hand dominant ; still having some issues with balance; having some tinnitus; having some occasional issues with being able to focus his eyes with turning his head to change what he is looking at.   Pt accompanied by: self  PERTINENT HISTORY: chronic bronchiitis  PAIN:  Are you having pain? No  PRECAUTIONS: Fall     WEIGHT BEARING RESTRICTIONS: No  FALLS: Has patient fallen in last 6 months? No  PLOF: Independent  PATIENT GOALS: get back to normal  OBJECTIVE:  Note: Objective measures were completed at Evaluation unless otherwise noted.  DIAGNOSTIC FINDINGS: no new  COGNITION: Overall cognitive status: Within functional limits for tasks assessed   SENSATION: WFL  COORDINATION: Mild decreased right versus left side   MUSCLE TONE: appears wnl    POSTURE: rounded shoulders and forward head   LOWER EXTREMITY MMT:    MMT Right Eval Left Eval  Hip flexion 4+ 5  Hip extension    Hip abduction    Hip adduction    Hip internal rotation    Hip external rotation    Knee flexion    Knee extension 4+ 5  Ankle dorsiflexion 4+ 5  Ankle plantarflexion    Ankle  inversion    Ankle eversion    (Blank rows = not tested)   STAIRS: Level of Assistance: Modified independence Stair Negotiation Technique: Alternating Pattern  with Single Rail on  Right Number of Stairs: 4  Height of Stairs: 7"  Comments: slight decreased speed  GAIT: Gait pattern: decreased stance time- Right and wide BOS Distance walked: 100 ft in clinic Assistive device utilized: None Level of assistance: Modified independence Comments: occasional misstep  FUNCTIONAL TESTS:  5 times sit to stand: 15.23 sec SLS  Right 3" and left 6" DGI 1. Gait level surface (2) Mild Impairment: Walks 20', uses assistive devices, slower speed, mild gait deviations. 2. Change in gait speed (2) Mild Impairment: Is able to change speed but demonstrates mild gait deviations, or not gait deviations but unable to achieve a significant change in velocity, or uses an assistive device. 3. Gait with horizontal head turns (1) Moderate Impairment: Performs head turns with moderate change in gait velocity, slows down, staggers but recovers, can continue to walk. 4. Gait with vertical head turns 1) Moderate Impairment: Performs head turns with moderate change in gait velocity, slows down, staggers but recovers, can continue to walk. 5. Gait and pivot turn (2) Mild Impairment: Pivot turns safely in > 3 seconds and stops with no loss of balance. 6. Step over obstacle (2) Mild Impairment: Is able to step over box, but must slow down and adjust steps to clear box safely. 7. Step around obstacles (2) Mild Impairment: Is able to step around both cones, but must slow down and adjust steps to clear cones. 8. Stairs (2) Mild Impairment: Alternating feet, must use rail.  TOTAL SCORE: 14 / 24   PATIENT SURVEYS:  ABC scale 85.6% LEFS 72/80 90%                                                                                                                              TREATMENT DATE:  11/07/23 Heel and toe  raises 20X warm up Lunges onto BOSU dome up 10x2 each no UE assist Step up/power up alternating on BOSU dome up 10x2 each Squat 2x 10 BOSU dome down with 1-2 UE's intermittent  Vector stance 10x 5" Bodycraft 3Pl 5RT each direction Nustep x 5' [place] trail L6 resistance, 70 spm  11/05/23 Seated: 162/85 mmHg, 88 bpm   STS to heel raise 10x  Lunges onto BOSU 10x each Step up/power up alternating on BOSU 10x each Squat 2x 10 BOSU (flatside up) Vector stance 5x 5" Bodycraft 2Pl 3RT Nustep x 5' [place] trail L6 resistance, 70 spm  11/01/23 Standing: Heel raises on incline x 20 Toe raises on decline x 20 Hip vectors x 10 each side Sit to stand with tidal tank x 10 Foam beam tandem stance 2 x 30" each 6" step up no UE assist x 10 each Air squats 2 x 10 Nustep warm up; seat 10, level 5; 5 minutes UE/LE               10/29/23 Standing: Heelraises 15X       Hip abduction 2 x 15 each  Hip extension 2X15 each  March 2 x 15 each side  4" forward lunges 2X15 each no UE assist          4" lateral step ups 2 x 15 each   4" forward step ups 2X15 each Nustep warm up; seat 10, level 5; 5 minutes UE/LE                       10/25/23 physical therapy evaluation and HEP instruction    PATIENT EDUCATION: Education details: Patient educated on exam findings, POC, scope of PT, HEP, and what to expect next visit. Person educated: Patient Education method: Explanation, Demonstration, and Handouts Education comprehension: verbalized understanding, returned demonstration, verbal cues required, and tactile cues required  HOME EXERCISE PROGRAM: Access Code: [email or web address] URL: [email or web address] Date: 08/01/2023 Prepared by: AP - Rehab   Exercises - Sit to Stand  - 2 x daily - 7 x weekly - 1 sets - 10 reps - Standing Single Leg Stance with Counter Support  - 2 x daily - 7 x weekly - 1 sets - 5 reps - 10 sec hold  GOALS: Goals reviewed with patient? No  SHORT TERM  GOALS: Target date: 11/08/2023  patient will be independent with initial HEP  Baseline: Goal status: INITIAL  2.  Patient will self report 30% improvement to improve tolerance for functional activity  Baseline:  Goal status: INITIAL  LONG TERM GOALS: Target date: 11/22/2023  Patient will be independent in self management strategies to improve quality of life and functional outcomes.   Baseline:  Goal status: INITIAL  2.  Patient will self report 50% improvement to improve tolerance for functional activity  Baseline:  Goal status: INITIAL  3.  Patient will increase right leg MMT's to 5/5 to allow navigation of steps without gait deviation or loss of balance Baseline: see above Goal status: INITIAL  4.  Patient will be able to stand SLS x 10" each leg to demonstrate improved functional balance.   Baseline: see above Goal status: INITIAL  5.  Patient will increase DGI score by 4 points to demonstrate improved balance with functional activity Baseline: 14/24 Goal status: INITIAL  ASSESSMENT:  CLINICAL IMPRESSION: Continued with focus on LE strengthening and balance.  Able to complete lunges onto BOSU without UE assist.  Step up with power up much more challenging, especially with only 1 UE assist.  Increased these to 2 sets today. Increased weight and reps on resisted walkouts with good control and no LOB.  Pt tolerated well to session with no reports of pain.  CGA to close supervision for safety with walkouts and squats on BOSU as well as requirement of intermittent HHA.    Evaluation:Patient is a 73 y.o. male who was seen today for physical therapy evaluation and treatment for balance impairment and right leg weakness after CVA. Patient demonstrates decreased strength, balance deficits and gait abnormalities which are negatively impacting patient ability to perform ADLs and functional mobility tasks. Patient will benefit from skilled physical therapy services to address these  deficits to improve level of function with ADLs, functional mobility tasks, and reduce risk for falls.    OBJECTIVE IMPAIRMENTS: Abnormal gait, decreased activity tolerance, decreased balance, decreased coordination, difficulty walking, decreased strength, and impaired perceived functional ability.   ACTIVITY LIMITATIONS: carrying, lifting, bending, standing, stairs, and locomotion level  PARTICIPATION LIMITATIONS: meal prep, cleaning, shopping, community activity, and yard work  [facility] POTENTIAL: Good  CLINICAL DECISION MAKING: Evolving/moderate complexity  EVALUATION COMPLEXITY: Moderate  PLAN:  PT FREQUENCY: 2x/week  PT DURATION: 4 weeks  PLANNED INTERVENTIONS: 97164- PT Re-evaluation, 97110-Therapeutic exercises, 97530- Therapeutic activity, 97112- Neuromuscular re-education, [phone]- Self Care, 60454- Manual therapy, [phone]- Gait training, [phone]- Orthotic Fit/training, [phone]- Canalith repositioning, [id number]- Aquatic Therapy, [phone]- Splinting, Patient/Family education, Balance training, Stair training, Taping, Dry Needling, Joint mobilization, Joint manipulation, Spinal manipulation, Spinal mobilization, Scar mobilization, and DME instructions.   PLAN FOR NEXT SESSION: Progress gait and balance; lower extremity strengthening.   Lurena Nida, PTA/CLT [facility] Ph: [phone]  4:32 PM, 11/07/23

## 2023-11-08 ENCOUNTER — Encounter: Payer: Self-pay | Admitting: Nurse Practitioner

## 2023-11-08 ENCOUNTER — Ambulatory Visit: Payer: HMO | Attending: Nurse Practitioner | Admitting: Nurse Practitioner

## 2023-11-08 VITALS — BP 124/74 | HR 74 | Ht 70.0 in | Wt 203.2 lb

## 2023-11-08 DIAGNOSIS — E785 Hyperlipidemia, unspecified: Secondary | ICD-10-CM | POA: Diagnosis not present

## 2023-11-08 DIAGNOSIS — I10 Essential (primary) hypertension: Secondary | ICD-10-CM | POA: Diagnosis not present

## 2023-11-08 DIAGNOSIS — I5032 Chronic diastolic (congestive) heart failure: Secondary | ICD-10-CM

## 2023-11-08 DIAGNOSIS — I2510 Atherosclerotic heart disease of native coronary artery without angina pectoris: Secondary | ICD-10-CM

## 2023-11-08 DIAGNOSIS — I639 Cerebral infarction, unspecified: Secondary | ICD-10-CM

## 2023-11-08 NOTE — Patient Instructions (Signed)
 Medication Instructions:  Continue all current medications.   Labwork: none  Testing/Procedures: none  Follow-Up: 6 months   Any Other Special Instructions Will Be Listed Below (If Applicable).   If you need a refill on your cardiac medications before your next appointment, please call your pharmacy.

## 2023-11-08 NOTE — Progress Notes (Unsigned)
Cardiology Office Note:  .   Date: 08/05/2023  ID:  Jeremy Lopez, DOB 1951-07-20, MRN [id number] PCP: Jeremy Lade, MD   HeartCare Providers Cardiologist:  Jeremy Pates, MD    History of Present Illness: .   Jeremy Lopez is a 73 y.o. male with a PMH of CAD, hyperlipidemia, hypertension, former tobacco user, leg edema, PVCs, CKD stage II, suspected HFpEF, past history of lung cancer and status post radiation/chemo, EtOH abuse, COPD/radiation fibrosis, (follows pulmonology), acute CVA, who presents today for scheduled follow-up.   Previous CV history includes DES to proximal LAD in 2010, the following year he received a drug-eluting stent to the mid RCA.  CTA previously revealed radiation fibrosis, history of DOE.  Echo 9/23 revealed EF 60 to 65%, grade 1 DD.  Cardiac authorization at that time revealed nonobstructive CAD in RCA and patent stents, mildly elevated LVEDP.  Was recommended to switch diltiazem to [place] and add Jardiance.   Saw Jeremy Spies, PA-C in August 2024, blood pressure was elevated out of being out of his medications for several days.  Was undergoing neurology workup for gait issues, mild parkinsonian is him noted at recent neuro visit.  MRI revealed ventriculomegaly and NPH on the differential.  Last saw Jeremy Lopez on June 24, 2023.  Denied any cardiac complaints or issues.  Was back on his BP medications.  BP improved with plans for close follow-up of lab work.  Hospital admission October 2024 for acute CVA.  MRI of the brain without contrast with acute versus early subacute infarct medial inferior right cerebellum.  Evaluated by neurology with recommendation for aspirin and Plavix x 3 weeks, then followed by aspirin alone.  Admitted to rehab for inpatient therapies.  Diltiazem 240 mg was discontinued on acute side due to orthostatic hypotension.  He was scheduled for follow-up with Dr. Tenny Lopez in December but due to recent hospitalization, concerns  regarding medication management, and patient MyChart messages with staff, it was felt best to bring him back for sooner follow-up.  Today he presents for scheduled follow-up.  He states his PCP recently made adjustments to his medicines, confirms he is taking Irbesartan 150 mg daily, as well as Toprol XL 100 mg daily. Says he just restarted taking Crestor today, was off this medication before. States he is not taking Aspirin and only taking Plavix and appears there is some confusion regarding what he has been told regarding his Aspirin and Plavix. Overall he is doing well from a cardiac perspective. Denies any chest pain, shortness of breath, palpitations, syncope, presyncope, dizziness, orthopnea, PND, swelling or significant weight changes, acute bleeding, or claudication.   ROS: Negative. See HPI.   Studies Reviewed: .    Echo 06/2023:  1. Left ventricular ejection fraction, by estimation, is 60 to 65%. The  left ventricle has normal function. The left ventricle has no regional  wall motion abnormalities. There is mild left ventricular hypertrophy.  Left ventricular diastolic parameters are consistent with Grade I diastolic dysfunction (impaired relaxation).   2. Right ventricular systolic function is normal. The right ventricular  size is normal. Tricuspid regurgitation signal is inadequate for assessing PA pressure.   3. The mitral valve is normal in structure. No evidence of mitral valve regurgitation.   4. The aortic valve is tricuspid. Aortic valve regurgitation is not  visualized. Aortic valve sclerosis is present, with no evidence of aortic valve stenosis.   5. The inferior vena cava is normal in size with greater  than 50%  respiratory variability, suggesting right atrial pressure of 3 mmHg.  LHC 06/2022:    Mid RCA to Dist RCA lesion is 40% stenosed.   Mid RCA lesion is 30% stenosed.   Non-stenotic Prox LAD lesion was previously treated.   LV end diastolic pressure is mildly  elevated.   Nonobstructive CAD. Prior stents in the LAD and RCA are patent Mildly elevated LVEDP 17 mm Hg   Plan: medical management.   Right/Left heart cath 04/2020:  Mid RCA lesion is 99% stenosed, heavily calcified. Following atherectomy, A drug-eluting stent was successfully placed using a STENT RESOLUTE ONYX 3.5X15, postdilated to 3.75 mm; stent optimized with IVUS. Post intervention, there is a 0% residual stenosis. Mid RCA to Dist RCA lesion is 40% stenosed. Previously placed Prox LAD stent, is widely patent. The left ventricular systolic function is normal. LV end diastolic pressure is normal. The left ventricular ejection fraction is 55-65% by visual estimate. There is no aortic valve stenosis.   Continue aggressive secondary prevention.  Still eligible for same-day discharge.   Consider Plavix monotherapy given his diffuse disease after DAPT is completed.  Lexiscan 04/2020:  Nuclear stress EF: 68%. There was no ST segment deviation noted during stress. No T wave inversion was noted during stress. The study is normal. This is a low risk study. The left ventricular ejection fraction is hyperdynamic (>65%).   1. Reduced counts in the inferior wall that improve on stress imaging with normal wall motion consistent with diaphragm attenuation.  2. No ischemia or infarction.  3. Normal LVEF, >65%.  4. Average exercise capacity (5:23 min:s; 7 METS). 5. Normal HR/BP response to exercise.  6. This is a low-risk study.  Physical Exam:   VS:  There were no vitals taken for this visit.   Wt Readings from Last 3 Encounters:  11/01/23 204 lb (92.5 kg)  09/09/23 203 lb 12.8 oz (92.4 kg)  08/19/23 202 lb 3.2 oz (91.7 kg)    GEN: Well nourished, well developed in no acute distress NECK: No JVD; No carotid bruits CARDIAC: S1/S2, RRR, no murmurs, rubs, gallops RESPIRATORY:  Clear to auscultation without rales, wheezing or rhonchi  EXTREMITIES:  No edema; No deformity   ASSESSMENT  AND PLAN: .    HFpEF Stage C, NYHA class I symptoms. EF 60-65%, Grade 1 DD 06/2023. Euvolemic and well compensated on exam. Continue Jardiance, Irbesartan, and Toprol XL. Low sodium diet, fluid restriction <2L, and daily weights encouraged. Educated to contact our office for weight gain of 2 lbs overnight or 5 lbs in one week. Will obtain BMET in 1 week.  CAD, HLD, medication management Stable with no anginal symptoms. No indication for ischemic evaluation. Will clarify Aspirin and Plavix with Neurology - see below. Continue rest of medication regimen. Just restarted rosuvastatin and tolerating well. Will recheck FLP/LFT in 2 months per protocol. Heart healthy diet and regular cardiovascular exercise encouraged.   Addendum: Dr. Marjory Lies consulted and stated "Long term aspirin or plavix monotherapy ok from neuro standpoint."   HTN BP stable. Discussed to monitor BP at home at least 2 hours after medications and sitting for 5-10 minutes. No medication changes at this time. Will obtain BMET as mentioned above. Heart healthy diet and regular cardiovascular exercise encouraged. Will provide him with BP cuff to monitor BP at home.   Acute CVA Will clarify Aspirin and Plavix with Neurology as there seems to be confusion. Patient at this current time remains on Plavix monotherapy. Denies any issues.  Continue to follow-up with therapy and with Neurology and PCP.   Dispo: Follow-up with me/APP in 3-4 months or sooner if anything changes.   Signed, Sharlene Dory, NP

## 2023-11-12 ENCOUNTER — Ambulatory Visit (HOSPITAL_COMMUNITY): Payer: HMO | Attending: Internal Medicine

## 2023-11-12 ENCOUNTER — Encounter (HOSPITAL_COMMUNITY): Payer: Self-pay

## 2023-11-12 DIAGNOSIS — R2689 Other abnormalities of gait and mobility: Secondary | ICD-10-CM | POA: Insufficient documentation

## 2023-11-12 DIAGNOSIS — I639 Cerebral infarction, unspecified: Secondary | ICD-10-CM | POA: Insufficient documentation

## 2023-11-12 DIAGNOSIS — I63211 Cerebral infarction due to unspecified occlusion or stenosis of right vertebral arteries: Secondary | ICD-10-CM | POA: Diagnosis present

## 2023-11-12 DIAGNOSIS — R262 Difficulty in walking, not elsewhere classified: Secondary | ICD-10-CM | POA: Insufficient documentation

## 2023-11-12 DIAGNOSIS — R200 Anesthesia of skin: Secondary | ICD-10-CM | POA: Insufficient documentation

## 2023-11-12 NOTE — Therapy (Signed)
 OUTPATIENT PHYSICAL THERAPY TREATMENT   Patient Name: Jeremy Lopez MRN: [id number] DOB:[birth year], 73 y.o., male Today's Date: 11/12/2023   PCP: Melvenia Pastor REFERRING PROVIDER: [name], MD END OF SESSION:  PT End of Session - 11/12/23 1349     Visit Number 6    Number of Visits 8    Date for PT Re-Evaluation 11/22/23    Authorization Type [employer]    Progress Note Due on Visit 10    PT Start Time 1348    PT Stop Time 1410    PT Time Calculation (min) 22 min    Activity Tolerance Patient tolerated treatment well    Behavior During Therapy [facility] for tasks assessed/performed              Past Medical History:  Diagnosis Date   CAD (coronary artery disease), native coronary artery    DES proximal LAD 07/2009; DES to RCA 04/2020   Chronic heart failure with preserved ejection fraction (HFpEF) (HCC)    Essential hypertension    Habitual alcohol use    Hyperlipidemia    PVC's (premature ventricular contractions)    Small cell lung cancer (HCC)    Right upper lobe 2008; no evidence of disease since treatment ended in 11/2007   Past Surgical History:  Procedure Laterality Date   CORONARY ATHERECTOMY N/A 04/26/2020   Procedure: CORONARY ATHERECTOMY;  Surgeon: Dann Candyce RAMAN, MD;  Location: [facility] INVASIVE CV LAB;  Service: Cardiovascular;  Laterality: N/A;   CORONARY STENT INTERVENTION N/A 04/26/2020   Procedure: CORONARY STENT INTERVENTION;  Surgeon: Dann Candyce RAMAN, MD;  Location: [facility] INVASIVE CV LAB;  Service: Cardiovascular;  Laterality: N/A;   CORONARY ULTRASOUND/IVUS N/A 04/26/2020   Procedure: Intravascular Ultrasound/IVUS;  Surgeon: Dann Candyce RAMAN, MD;  Location: [facility] INVASIVE CV LAB;  Service: Cardiovascular;  Laterality: N/A;   Insertion of left subclavian Port-A-Cath  2008   LEFT HEART CATH AND CORONARY ANGIOGRAPHY N/A 07/03/2022   Procedure: LEFT HEART CATH AND CORONARY ANGIOGRAPHY;  Surgeon: Jordan, Peter M, MD;  Location: [facility] INVASIVE CV  LAB;  Service: Cardiovascular;  Laterality: N/A;   RIGHT/LEFT HEART CATH AND CORONARY ANGIOGRAPHY N/A 04/26/2020   Procedure: RIGHT/LEFT HEART CATH AND CORONARY ANGIOGRAPHY;  Surgeon: Dann Candyce RAMAN, MD;  Location: [facility] INVASIVE CV LAB;  Service: Cardiovascular;  Laterality: N/A;   TEMPORARY PACEMAKER N/A 04/26/2020   Procedure: TEMPORARY PACEMAKER;  Surgeon: Dann Candyce RAMAN, MD;  Location: [facility] INVASIVE CV LAB;  Service: Cardiovascular;  Laterality: N/A;   Video bronchoscopy and video mediastinoscopy  2008   Patient Active Problem List   Diagnosis Date Noted   Diarrhea 11/01/2023   Acute CVA (cerebrovascular accident) (HCC) 07/13/2023   Dizziness 07/12/2023   Cerebellar stroke, acute (HCC) 07/07/2023   CVA (cerebral vascular accident) (HCC) 07/05/2023   Headache 07/05/2023   Prediabetes 02/01/2023   CKD (chronic kidney disease) stage 2, GFR 60-89 ml/min 02/01/2023   Nausea 02/01/2023   Colon cancer screening 02/01/2023   CHF (congestive heart failure) (HCC) 01/23/2023   Numbness and tingling of both lower extremities 01/23/2023   Encounter for general adult medical examination with abnormal findings 01/23/2023   Allergic rhinitis 01/23/2022   Asthmatic bronchitis, mild persistent, uncomplicated 01/23/2022   Upper airway cough syndrome 01/23/2022   Simple chronic bronchitis (HCC) 12/28/2021   Seasonal allergies 12/28/2021   DOE (dyspnea on exertion)    Nosebleed 10/25/2014   Lung cancer (HCC) 02/05/2010   CAD (coronary artery disease), native coronary artery 02/05/2010  Mixed hyperlipidemia 08/16/2009   Essential hypertension 07/08/2009    ONSET DATE: 07/05/23  REFERRING DIAG: P36.788 (ICD-10-CM) - Cerebrovascular accident (CVA) due to stenosis of right vertebral artery (HCC)  THERAPY DIAG:  Difficulty in walking, not elsewhere classified  Cerebrovascular accident (CVA) due to stenosis of right vertebral artery (HCC)  Abnormality of gait due to impairment of  balance  Bilateral leg numbness  Other abnormalities of gait and mobility  Acute CVA (cerebrovascular accident) (HCC)  Rationale for Evaluation and Treatment: Rehabilitation  SUBJECTIVE:                                                                                                                                                                                             SUBJECTIVE STATEMENT: Stated BP was higher this morning, BP medication taken 10 am.  Feels his balance is making improvements.  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  Woke up nauseated and weak on right side 07/05/23; went to hospital later that day. After hospitalization was in inpatient rehab 7 days; d/c home 10/13; sees neurologist 2/11 Pneumalli for spinal tap?   Right side weakness; left hand dominant ; still having some issues with balance; having some tinnitus; having some occasional issues with being able to focus his eyes with turning his head to change what he is looking at.   Pt accompanied by: self  PERTINENT HISTORY: chronic bronchiitis  PAIN:  Are you having pain? No  PRECAUTIONS: Fall     WEIGHT BEARING RESTRICTIONS: No  FALLS: Has patient fallen in last 6 months? No  PLOF: Independent  PATIENT GOALS: get back to normal  OBJECTIVE:  Note: Objective measures were completed at Evaluation unless otherwise noted.  DIAGNOSTIC FINDINGS: no new  COGNITION: Overall cognitive status: Within functional limits for tasks assessed   SENSATION: WFL  COORDINATION: Mild decreased right versus left side   MUSCLE TONE: appears wnl    POSTURE: rounded shoulders and forward head   LOWER EXTREMITY MMT:    MMT Right Eval Left Eval  Hip flexion 4+ 5  Hip extension    Hip abduction    Hip adduction    Hip internal rotation    Hip external rotation     Knee flexion    Knee extension 4+ 5  Ankle dorsiflexion 4+ 5  Ankle plantarflexion    Ankle inversion    Ankle eversion    (Blank rows = not tested)   STAIRS: Level of Assistance: Modified independence Psychologist, Counselling  Technique: Alternating Pattern  with Single Rail on Right Number of Stairs: 4  Height of Stairs: 7  Comments: slight decreased speed  GAIT: Gait pattern: decreased stance time- Right and wide BOS Distance walked: 100 ft in clinic Assistive device utilized: None Level of assistance: Modified independence Comments: occasional misstep  FUNCTIONAL TESTS:  5 times sit to stand: 15.23 sec SLS  Right 3 and left 6 DGI 1. Gait level surface (2) Mild Impairment: Walks 20', uses assistive devices, slower speed, mild gait deviations. 2. Change in gait speed (2) Mild Impairment: Is able to change speed but demonstrates mild gait deviations, or not gait deviations but unable to achieve a significant change in velocity, or uses an assistive device. 3. Gait with horizontal head turns (1) Moderate Impairment: Performs head turns with moderate change in gait velocity, slows down, staggers but recovers, can continue to walk. 4. Gait with vertical head turns 1) Moderate Impairment: Performs head turns with moderate change in gait velocity, slows down, staggers but recovers, can continue to walk. 5. Gait and pivot turn (2) Mild Impairment: Pivot turns safely in > 3 seconds and stops with no loss of balance. 6. Step over obstacle (2) Mild Impairment: Is able to step over box, but must slow down and adjust steps to clear box safely. 7. Step around obstacles (2) Mild Impairment: Is able to step around both cones, but must slow down and adjust steps to clear cones. 8. Stairs (2) Mild Impairment: Alternating feet, must use rail.  TOTAL SCORE: 14 / 24   PATIENT SURVEYS:  ABC scale 85.6% LEFS 72/80 90%                                                                                                                               TREATMENT DATE:  11/12/23: Vitals:  seated position Lt arm BP 13:50 165/84 mmHg       14:07: 173/94 mmHg HR 67  Standing: Heel and toe raises 20X warm up UE flexion opposite LE march alternating 10x 5  11/07/23 Heel and toe raises 20X warm up Lunges onto BOSU dome up 10x2 each no UE assist Step up/power up alternating on BOSU dome up 10x2 each Squat 2x 10 BOSU dome down with 1-2 UE's intermittent  Vector stance 10x 5 [employer] 5RT each direction Nustep x 5' [place] trail L6 resistance, 70 spm  11/05/23 Seated: 162/85 mmHg, 88 bpm   STS to heel raise 10x  Lunges onto BOSU 10x each Step up/power up alternating on BOSU 10x each Squat 2x 10 BOSU (flatside up) Vector stance 5x 5 [employer] 2[employer] 3RT Nustep x 5' [place] trail L6 resistance, 70 spm  11/01/23 Standing: Heel raises on incline x 20 Toe raises on decline x 20 Hip vectors x 10 each side Sit to stand with tidal tank x 10 Foam beam tandem stance 2 x 30 each 6 step up no UE assist x 10 each Air squats 2  x 10 Nustep warm up; seat 10, level 5; 5 minutes UE/LE               10/29/23 Standing: Heelraises 15X       Hip abduction 2 x 15 each  Hip extension 2X15 each          March 2 x 15 each side  4 forward lunges 2X15 each no UE assist          4 lateral step ups 2 x 15 each   4 forward step ups 2X15 each Nustep warm up; seat 10, level 5; 5 minutes UE/LE                       10/25/23 physical therapy evaluation and HEP instruction    PATIENT EDUCATION: Education details: Patient educated on exam findings, POC, scope of PT, HEP, and what to expect next visit. Person educated: Patient Education method: Explanation, Demonstration, and Handouts Education comprehension: verbalized understanding, returned demonstration, verbal cues required, and tactile cues required  HOME EXERCISE PROGRAM: Access Code: [email or web address] URL:  [email or web address] Date: 08/01/2023 Prepared by: [facility] - Rehab   Exercises - Sit to Stand  - 2 x daily - 7 x weekly - 1 sets - 10 reps - Standing Single Leg Stance with Counter Support  - 2 x daily - 7 x weekly - 1 sets - 5 reps - 10 sec hold  GOALS: Goals reviewed with patient? No  SHORT TERM GOALS: Target date: 11/08/2023  patient will be independent with initial HEP  Baseline: Goal status: INITIAL  2.  Patient will self report 30% improvement to improve tolerance for functional activity  Baseline:  Goal status: INITIAL  LONG TERM GOALS: Target date: 11/22/2023  Patient will be independent in self management strategies to improve quality of life and functional outcomes.   Baseline:  Goal status: INITIAL  2.  Patient will self report 50% improvement to improve tolerance for functional activity  Baseline:  Goal status: INITIAL  3.  Patient will increase right leg MMT's to 5/5 to allow navigation of steps without gait deviation or loss of balance Baseline: see above Goal status: INITIAL  4.  Patient will be able to stand SLS x 10 each leg to demonstrate improved functional balance.   Baseline: see above Goal status: INITIAL  5.  Patient will increase DGI score by 4 points to demonstrate improved balance with functional activity Baseline: 14/24 Goal status: INITIAL  ASSESSMENT:  CLINICAL IMPRESSION: Pt reports BP higher than normal this session.  Assessed vitals through session, with increase in BP following 3 exercises.  Pt with no symptoms through session.  Advised to check BP regularly, document and contact MD if continues to be high.  PT terminated this session as BP stated at 165/84 mmHg and increased to 173/94 mmHg.  Evaluation:Patient is a 73 y.o. male who was seen today for physical therapy evaluation and treatment for balance impairment and right leg weakness after CVA. Patient demonstrates decreased strength, balance deficits and gait  abnormalities which are negatively impacting patient ability to perform ADLs and functional mobility tasks. Patient will benefit from skilled physical therapy services to address these deficits to improve level of function with ADLs, functional mobility tasks, and reduce risk for falls.    OBJECTIVE IMPAIRMENTS: Abnormal gait, decreased activity tolerance, decreased balance, decreased coordination, difficulty walking, decreased strength, and impaired perceived functional ability.   ACTIVITY LIMITATIONS: carrying, lifting, bending, standing, stairs, and locomotion level  PARTICIPATION LIMITATIONS: meal prep, cleaning, shopping, community activity, and yard work  [facility] POTENTIAL: Good  CLINICAL DECISION MAKING: Evolving/moderate complexity  EVALUATION COMPLEXITY: Moderate  PLAN:  PT FREQUENCY: 2x/week  PT DURATION: 4 weeks  PLANNED INTERVENTIONS: 97164- PT Re-evaluation, 97110-Therapeutic exercises, 97530- Therapeutic activity, 97112- Neuromuscular re-education, 97535- Self Care, [id number]- Manual therapy, [phone]- Gait training, [phone]- Orthotic Fit/training, [phone]- Canalith repositioning, [id number]- Aquatic Therapy, [phone]- Splinting, Patient/Family education, Balance training, Stair training, Taping, Dry Needling, Joint mobilization, Joint manipulation, Spinal manipulation, Spinal mobilization, Scar mobilization, and DME instructions.   PLAN FOR NEXT SESSION: Progress gait and balance; lower extremity strengthening.   Augustin Mclean, LPTA/CLT; CBIS [phone]  2:25 PM, 11/12/23

## 2023-11-14 ENCOUNTER — Other Ambulatory Visit: Payer: Self-pay | Admitting: Pulmonary Disease

## 2023-11-14 ENCOUNTER — Ambulatory Visit (HOSPITAL_COMMUNITY): Payer: HMO

## 2023-11-14 ENCOUNTER — Encounter (HOSPITAL_COMMUNITY): Payer: Self-pay

## 2023-11-14 NOTE — Therapy (Signed)
 Pt arrived with BP at 176/86 mmHg and HR at 66.  Reports of MD apt next Friday.  Encouraged to check BP prior coming to therapy and cancel if remains high, verbalized understanding.    Augustin Mclean, LPTA/CLT; CBIS [phone]  Mclean Augustin Amble, PTA 11/14/2023, 1:16 PM

## 2023-11-18 ENCOUNTER — Ambulatory Visit (HOSPITAL_COMMUNITY): Payer: HMO | Admitting: Physical Therapy

## 2023-11-18 ENCOUNTER — Encounter: Payer: HMO | Admitting: Physical Medicine and Rehabilitation

## 2023-11-19 ENCOUNTER — Encounter: Payer: Self-pay | Admitting: Diagnostic Neuroimaging

## 2023-11-19 ENCOUNTER — Ambulatory Visit: Payer: HMO | Admitting: Diagnostic Neuroimaging

## 2023-11-19 VITALS — BP 147/74 | HR 64 | Ht 70.0 in | Wt 204.0 lb

## 2023-11-19 DIAGNOSIS — R269 Unspecified abnormalities of gait and mobility: Secondary | ICD-10-CM

## 2023-11-19 DIAGNOSIS — G912 (Idiopathic) normal pressure hydrocephalus: Secondary | ICD-10-CM

## 2023-11-19 NOTE — Patient Instructions (Signed)
  GAIT DIFFICULTY (mild cogwheeling rigidity in left upper ext; stooped posture; short steps; mild parkinsonism; also with ventriculomegaly in 2019) - check large volume LP - continue PT evaluation  RIGHT CEREBELLAR STROKE - continue clopidogrel, BP control, statin

## 2023-11-19 NOTE — Progress Notes (Addendum)
[facility]  PATIENT: Jeremy Lopez DOB: 1951/08/02  REFERRING CLINICIAN: [name], MD HISTORY FROM: patient REASON FOR VISIT: new consult   HISTORICAL  CHIEF COMPLAINT:  Chief Complaint  Patient presents with   Follow-up    Pt in 7 with wife Pt here for stroke f/u Pt states gait is off ,fatigue. Pt states ringing in both ears . Pt states issues looking at things in a room . Pt states up twice a night Pt states some incontinence Pt wants to discuss LP     HISTORY OF PRESENT ILLNESS:   UPDATE (11/19/23, VRP): Since last visit, had new onset vertigo, gait difficulty, and was diagnosed with right cerebellar stroke in Sept 2024; patient was admitted and stroke workup was completed.  He was managed medically, treated with aspirin and Plavix, now on Plavix alone.  Unfortunately was not able to get his lumbar puncture due to the stroke.  Now interested in pursuing LP for NPH workup.  PRIOR HPI (05/13/23, VRP): 73 year old male here for evaluation of gait and balance difficulty.  Symptoms started around 2016 when he noticed that his right leg was dragging on the ground.  Symptoms progressively worsened over time.  Now he has a shuffling gait, leaning forward posture, sometimes loses balance or his upper body goes forward and feet get stuck to the ground.  No definite tremor.  Noted speech or swallowing issues.  Has slowed down in general.  History of lung cancer 2008 treated with chemotherapy and radiation.   REVIEW OF SYSTEMS: Full 14 system review of systems performed and negative with exception of: as per hPI.  ALLERGIES: Allergies  Allergen Reactions   Cephalexin Anaphylaxis    throat swelling Patient said he can take amoxicillin   Amlodipine Swelling    Leg swelling    HOME MEDICATIONS: Outpatient Medications Prior to Visit  Medication Sig Dispense Refill   acetaminophen (TYLENOL) 325 MG tablet Take 1-2 tablets (325-650 mg total) by mouth every 4  (four) hours as needed for mild pain.     Blood Pressure Monitoring (OMRON 3 SERIES BP MONITOR) DEVI As directed 1 each 1   chlorpheniramine-HYDROcodone (TUSSIONEX) 10-8 MG/5ML Take 5 mLs by mouth every 12 (twelve) hours as needed for cough. 115 mL 0   Cholecalciferol (VITAMIN D) 50 MCG (2000 UT) tablet Take 2,000 Units by mouth daily.     clopidogrel (PLAVIX) 75 MG tablet Take 1 tablet (75 mg total) by mouth daily. 90 tablet 1   diphenoxylate-atropine (LOMOTIL) 2.5-0.025 MG tablet Take 1 tablet by mouth 4 (four) times daily as needed for diarrhea or loose stools. 30 tablet 0   empagliflozin (JARDIANCE) 10 MG TABS tablet Take 1 tablet (10 mg total) by mouth daily. 30 tablet 6   escitalopram (LEXAPRO) 10 MG tablet Take 1 tablet (10 mg total) by mouth daily. 30 tablet 1   Fluticasone-Umeclidin-Vilant (TRELEGY ELLIPTA) 100-62.5-25 MCG/ACT AEPB Inhale 1 puff into the lungs daily. 180 each 3   gabapentin (NEURONTIN) 300 MG capsule Take 1 capsule (300 mg total) by mouth at bedtime. 90 capsule 3   irbesartan (AVAPRO) 300 MG tablet Take 1 tablet (300 mg total) by mouth daily. 90 tablet 1   l-methylfolate-B6-B12 (METANX) 3-35-2 MG TABS tablet Take 1 tablet by mouth daily. 30 tablet 0   magnesium oxide (MAG-OX) 400 MG tablet Take 1.5 tablets (600 mg total) by mouth at bedtime. 30 tablet 0   metoprolol succinate (TOPROL-XL) 100 MG 24 hr tablet Take 1  tablet (100 mg total) by mouth daily. Take with or immediately following a meal. 90 tablet 3   nitroGLYCERIN (NITROSTAT) 0.4 MG SL tablet Place 1 tablet (0.4 mg total) under the tongue every 5 (five) minutes as needed for chest pain. 25 tablet 2   prochlorperazine (COMPAZINE) 10 MG tablet Take 1 tablet (10 mg total) by mouth every 6 (six) hours as needed for nausea or vomiting. 30 tablet 0   rosuvastatin (CRESTOR) 40 MG tablet TAKE ONE (1) TABLET BY MOUTH EVERY DAY 90 tablet 3   tamsulosin (FLOMAX) 0.4 MG CAPS capsule Take 1 capsule (0.4 mg total) by mouth daily  after supper. 90 capsule 3   traZODone (DESYREL) 50 MG tablet Take 1 tablet (50 mg total) by mouth at bedtime. 90 tablet 1   No facility-administered medications prior to visit.    PAST MEDICAL HISTORY: Past Medical History:  Diagnosis Date   CAD (coronary artery disease), native coronary artery    DES proximal LAD 07/2009; DES to RCA 04/2020   Chronic heart failure with preserved ejection fraction (HFpEF) (HCC)    Essential hypertension    Habitual alcohol use    Hyperlipidemia    PVC's (premature ventricular contractions)    Small cell lung cancer (HCC)    Right upper lobe 2008; no evidence of disease since treatment ended in 11/2007    PAST SURGICAL HISTORY: Past Surgical History:  Procedure Laterality Date   CORONARY ATHERECTOMY N/A 04/26/2020   Procedure: CORONARY ATHERECTOMY;  Surgeon: Corky Crafts, MD;  Location: [facility] INVASIVE CV LAB;  Service: Cardiovascular;  Laterality: N/A;   CORONARY STENT INTERVENTION N/A 04/26/2020   Procedure: CORONARY STENT INTERVENTION;  Surgeon: Corky Crafts, MD;  Location: [facility] INVASIVE CV LAB;  Service: Cardiovascular;  Laterality: N/A;   CORONARY ULTRASOUND/IVUS N/A 04/26/2020   Procedure: Intravascular Ultrasound/IVUS;  Surgeon: Corky Crafts, MD;  Location: [facility] INVASIVE CV LAB;  Service: Cardiovascular;  Laterality: N/A;   Insertion of left subclavian Port-A-Cath  2008   LEFT HEART CATH AND CORONARY ANGIOGRAPHY N/A 07/03/2022   Procedure: LEFT HEART CATH AND CORONARY ANGIOGRAPHY;  Surgeon: Swazil[place], Peter M, MD;  Location: [facility] INVASIVE CV LAB;  Service: Cardiovascular;  Laterality: N/A;   RIGHT/LEFT HEART CATH AND CORONARY ANGIOGRAPHY N/A 04/26/2020   Procedure: RIGHT/LEFT HEART CATH AND CORONARY ANGIOGRAPHY;  Surgeon: Corky Crafts, MD;  Location: [facility] INVASIVE CV LAB;  Service: Cardiovascular;  Laterality: N/A;   TEMPORARY PACEMAKER N/A 04/26/2020   Procedure: TEMPORARY PACEMAKER;  Surgeon: Corky Crafts, MD;  Location:  [facility] INVASIVE CV LAB;  Service: Cardiovascular;  Laterality: N/A;   Video bronchoscopy and video mediastinoscopy  2008    FAMILY HISTORY: Family History  Problem Relation Age of Onset   Cancer Father    CAD Brother    Hypertension Brother    Depression Maternal Uncle    Alcohol abuse Maternal Uncle    Stroke Neg Hx     SOCIAL HISTORY: Social History   Socioeconomic History   Marital status: Married    Spouse name: [name]   Number of children: 2   Years of education: 14   Highest education level: Tax adviser degree: occupational, Scientist, product/process development, or vocational program  Occupational History   Occupation: Full time    Comment: Industrial/product designer for [employer]   Occupation: Retired Industrial/product designer    Comment: [employer]  Tobacco Use   Smoking status: Former    Current packs/day: 0.00    Types: Cigarettes    Start  date: 10/25/1966    Quit date: 10/25/1998    Years since quitting: 25.0   Smokeless tobacco: Never  Vaping Use   Vaping status: Never Used  Substance and Sexual Activity   Alcohol use: Yes    Alcohol/week: 14.0 standard drinks of alcohol    Types: 14 Cans of beer per week    Comment: 1 drink per day   Drug use: Not Currently    Types: Marijuana   Sexual activity: Yes  Other Topics Concern   Not on file  Social History Narrative   Lives in [place] with wife. Enjoys reading, playing golf in free time.    Pt retired    Teacher, early years/pre Strain: Low Risk  (10/28/2023)   Overall Financial Resource Strain (CARDIA)    Difficulty of Paying Living Expenses: Not very hard  Food Insecurity: No Food Insecurity (10/28/2023)   Hunger Vital Sign    Worried About Running Out of Food in the Last Year: Never true    Ran Out of Food in the Last Year: Never true  Transportation Needs: No Transportation Needs (10/28/2023)   PRAPARE - Administrator, Civil Service (Medical): No    Lack of Transportation (Non-Medical): No  Physical Activity:  Insufficiently Active (10/28/2023)   Exercise Vital Sign    Days of Exercise per Week: 1 day    Minutes of Exercise per Session: 10 min  Stress: No Stress Concern Present (10/28/2023)   [employer] of Occupational Health - Occupational Stress Questionnaire    Feeling of Stress : Only a little  Social Connections: Unknown (10/28/2023)   Social Connection and Isolation Panel [NHANES]    Frequency of Communication with Friends and Family: Twice a week    Frequency of Social Gatherings with Friends and Family: Once a week    Attends Religious Services: Patient declined    Database administrator or Organizations: Yes    Attends Engineer, structural: More than 4 times per year    Marital Status: Married  Catering manager Violence: Not At Risk (07/05/2023)   Humiliation, Afraid, Rape, and Kick questionnaire    Fear of Current or Ex-Partner: No    Emotionally Abused: No    Physically Abused: No    Sexually Abused: No     PHYSICAL EXAM  GENERAL EXAM/CONSTITUTIONAL: Vitals:  Vitals:   11/19/23 1138  BP: (!) 147/74  Pulse: 64  Weight: 204 lb (92.5 kg)  Height: 5\' 10"  (1.778 m)   Body mass index is 29.27 kg/m. Wt Readings from Last 3 Encounters:  11/19/23 204 lb (92.5 kg)  11/08/23 203 lb 3.2 oz (92.2 kg)  11/01/23 204 lb (92.5 kg)   Patient is in no distress; well developed, nourished and groomed; neck is supple  CARDIOVASCULAR: Examination of carotid arteries is normal; no carotid bruits Regular rate and rhythm, no murmurs Examination of peripheral vascular system by observation and palpation is normal  EYES: Ophthalmoscopic exam of optic discs and posterior segments is normal; no papilledema or hemorrhages No results found.  MUSCULOSKELETAL: Gait, strength, tone, movements noted in Neurologic exam below  NEUROLOGIC: MENTAL STATUS:      No data to display         awake, alert, oriented to person, place and time recent and remote memory intact normal  attention and concentration language fluent, comprehension intact, naming intact fund of knowledge appropriate  CRANIAL NERVE:  2nd - no papilledema on fundoscopic exam 2nd,  3rd, 4th, 6th - pupils equal and reactive to light, visual fields full to confrontation, extraocular muscles intact, no nystagmus 5th - facial sensation symmetric 7th - facial strength symmetric 8th - hearing intact 9th - palate elevates symmetrically, uvula midline 11th - shoulder shrug symmetric 12th - tongue protrusion midline MASKED FACIES  MOTOR:  normal bulk and tone, full strength in the BUE, BLE SLIGHTLY INCREASED TONE IN LUE  SENSORY:  normal and symmetric to light touch, temperature, vibration  COORDINATION:  finger-nose-finger, fine finger movements normal  REFLEXES:  deep tendon reflexes 1+ and symmetric  GAIT/STATION:  narrow based gait; STOOPED POSTURE, DECREASE ARM SWING; SHORT STEPS    DIAGNOSTIC DATA (LABS, IMAGING, TESTING) - I reviewed patient records, labs, notes, testing and imaging myself where available.  Lab Results  Component Value Date   WBC 6.5 07/15/2023   HGB 13.2 07/15/2023   HCT 39.6 07/15/2023   MCV 92.7 07/15/2023   PLT 356 07/15/2023      Component Value Date/Time   NA 133 (L) 07/15/2023 0556   NA 140 01/18/2023 1053   K 3.6 07/15/2023 0556   CL 99 07/15/2023 0556   CO2 25 07/15/2023 0556   GLUCOSE 78 07/15/2023 0556   BUN 20 07/15/2023 0556   BUN 19 01/18/2023 1053   CREATININE 1.19 07/15/2023 0556   CREATININE 1.21 12/28/2019 1501   CALCIUM 8.3 (L) 07/15/2023 0556   PROT 6.0 (L) 07/06/2023 0602   PROT 6.9 01/18/2023 1053   ALBUMIN 3.3 (L) 07/06/2023 0602   ALBUMIN 4.4 01/18/2023 1053   AST 11 (L) 07/06/2023 0602   ALT 13 07/06/2023 0602   ALKPHOS 61 07/06/2023 0602   BILITOT 0.6 07/06/2023 0602   BILITOT 0.4 01/18/2023 1053   GFRNONAA >60 07/15/2023 0556   GFRAA >60 04/19/2020 1348   Lab Results  Component Value Date   CHOL 98 07/06/2023    HDL 42 07/06/2023   LDLCALC 34 07/06/2023   TRIG 109 07/06/2023   CHOLHDL 2.3 07/06/2023   Lab Results  Component Value Date   HGBA1C 5.7 (H) 07/05/2023   Lab Results  Component Value Date   VITAMINB12 261 01/18/2023   Lab Results  Component Value Date   TSH 2.590 01/18/2023    06/08/08 MRI brain [I reviewed images myself and agree with interpretation. -VRP]  - Stable. No acute or metastatic intracranial abnormality.   10/17/17 MRI brain [I reviewed images myself and agree with interpretation. -VRP]  -No evidence metastatic disease. No focal brain insult. No abnormality seen to explain headache. Chronic ventriculomegaly which may be the patient's baseline. Is there any clinical concern regarding normal pressure hydrocephalus? - Abnormal flow in the left vertebral artery which could be slow flow or distal stenosis.  06/01/23 MRI of the brain with and without contrast shows the following: Ventriculomegaly out of proportion to the extent of generalized cortical atrophy.  There is sulcal effacement at the vertex.  The ventriculomegaly could be due to generalized cortical atrophy combined with normal pressure hydrocephalus, increased compared to the MRI from 2019. No significant chronic ischemic changes are noted. Normal enhancement pattern.  No acute findings.  07/05/23 MRI brain Acute/early subacute infarct of the medial inferior right cerebellum.  07/05/23 CTA head / neck 1. No emergent large vessel occlusion. 2. Moderate narrowing of the right vertebral artery V4 segment just distal to the origin of the right PICA. 3. Moderately narrowed right V1 segment and multifocal mild narrowing of the right V2 segment. Irregularity of the  left V3 segment, which remains widely patent. 4. Bilateral carotid bifurcation atherosclerosis with less than 50% stenosis.  07/07/23 TTE 1. Left ventricular ejection fraction, by estimation, is 60 to 65%. The  left ventricle has normal function. The  left ventricle has no regional  wall motion abnormalities. There is mild left ventricular hypertrophy.  Left ventricular diastolic parameters  are consistent with Grade I diastolic dysfunction (impaired relaxation).   2. Right ventricular systolic function is normal. The right ventricular  size is normal. Tricuspid regurgitation signal is inadequate for assessing  PA pressure.   3. The mitral valve is normal in structure. No evidence of mitral valve  regurgitation.   4. The aortic valve is tricuspid. Aortic valve regurgitation is not  visualized. Aortic valve sclerosis is present, with no evidence of aortic  valve stenosis.   5. The inferior vena cava is normal in size with greater than 50%  respiratory variability, suggesting right atrial pressure of 3 mmHg.    ASSESSMENT AND PLAN  73 y.o. year old male here with:   Dx:  1. Gait difficulty   2. NPH (normal pressure hydrocephalus) (HCC)     PLAN:  GAIT DIFFICULTY (mild cogwheeling rigidity in left upper ext; stooped posture; short steps; mild parkinsonism; also with ventriculomegaly in 2019) - check large volume LP - may consider DATscan - continue PT evaluation  RIGHT CEREBELLAR STROKE - continue clopidogrel, BP control, statin  Return for pending if symptoms worsen or fail to improve, pending test results.    Suanne Marker, MD 11/19/2023, 11:55 AM Certified in Neurology, Neurophysiology and Neuroimaging  [facility] [address] [place], [place] [address] [phone]

## 2023-11-20 ENCOUNTER — Ambulatory Visit (HOSPITAL_COMMUNITY): Payer: HMO

## 2023-11-20 DIAGNOSIS — R262 Difficulty in walking, not elsewhere classified: Secondary | ICD-10-CM

## 2023-11-20 DIAGNOSIS — R2689 Other abnormalities of gait and mobility: Secondary | ICD-10-CM

## 2023-11-20 DIAGNOSIS — I63211 Cerebral infarction due to unspecified occlusion or stenosis of right vertebral arteries: Secondary | ICD-10-CM

## 2023-11-20 NOTE — Therapy (Signed)
OUTPATIENT PHYSICAL THERAPY TREATMENT/ Progress note Progress Note Reporting Period 10/25/23 to 11/20/2023  See note below for Objective Data and Assessment of Progress/Goals.       Patient Name: Jeremy Lopez MRN: [id number] DOB:06/23/1951, 72 y.o., male Today's Date: 11/20/2023   PCP: Christel Mormon REFERRING PROVIDER: Pricilla Riffle, MD END OF SESSION:  PT End of Session - 11/20/23 1346     Visit Number 7    Number of Visits 16    Date for PT Re-Evaluation 12/20/23    Authorization Type [employer]    Progress Note Due on Visit 16    PT Start Time 1346    PT Stop Time 1426    PT Time Calculation (min) 40 min    Activity Tolerance Other (comment);Patient tolerated treatment well    Behavior During Therapy [facility] for tasks assessed/performed              Past Medical History:  Diagnosis Date   CAD (coronary artery disease), native coronary artery    DES proximal LAD 07/2009; DES to RCA 04/2020   Chronic heart failure with preserved ejection fraction (HFpEF) (HCC)    Essential hypertension    Habitual alcohol use    Hyperlipidemia    PVC's (premature ventricular contractions)    Small cell lung cancer (HCC)    Right upper lobe 2008; no evidence of disease since treatment ended in 11/2007   Past Surgical History:  Procedure Laterality Date   CORONARY ATHERECTOMY N/A 04/26/2020   Procedure: CORONARY ATHERECTOMY;  Surgeon: Corky Crafts, MD;  Location: [facility] INVASIVE CV LAB;  Service: Cardiovascular;  Laterality: N/A;   CORONARY STENT INTERVENTION N/A 04/26/2020   Procedure: CORONARY STENT INTERVENTION;  Surgeon: Corky Crafts, MD;  Location: [facility] INVASIVE CV LAB;  Service: Cardiovascular;  Laterality: N/A;   CORONARY ULTRASOUND/IVUS N/A 04/26/2020   Procedure: Intravascular Ultrasound/IVUS;  Surgeon: Corky Crafts, MD;  Location: [facility] INVASIVE CV LAB;  Service: Cardiovascular;  Laterality: N/A;   Insertion of left subclavian Port-A-Cath  2008    LEFT HEART CATH AND CORONARY ANGIOGRAPHY N/A 07/03/2022   Procedure: LEFT HEART CATH AND CORONARY ANGIOGRAPHY;  Surgeon: Swazil[place], Peter M, MD;  Location: [facility] INVASIVE CV LAB;  Service: Cardiovascular;  Laterality: N/A;   RIGHT/LEFT HEART CATH AND CORONARY ANGIOGRAPHY N/A 04/26/2020   Procedure: RIGHT/LEFT HEART CATH AND CORONARY ANGIOGRAPHY;  Surgeon: Corky Crafts, MD;  Location: [facility] INVASIVE CV LAB;  Service: Cardiovascular;  Laterality: N/A;   TEMPORARY PACEMAKER N/A 04/26/2020   Procedure: TEMPORARY PACEMAKER;  Surgeon: Corky Crafts, MD;  Location: [facility] INVASIVE CV LAB;  Service: Cardiovascular;  Laterality: N/A;   Video bronchoscopy and video mediastinoscopy  2008   Patient Active Problem List   Diagnosis Date Noted   Diarrhea 11/01/2023   Acute CVA (cerebrovascular accident) (HCC) 07/13/2023   Dizziness 07/12/2023   Cerebellar stroke, acute (HCC) 07/07/2023   CVA (cerebral vascular accident) (HCC) 07/05/2023   Headache 07/05/2023   Prediabetes 02/01/2023   CKD (chronic kidney disease) stage 2, GFR 60-89 ml/min 02/01/2023   Nausea 02/01/2023   Colon cancer screening 02/01/2023   CHF (congestive heart failure) (HCC) 01/23/2023   Numbness and tingling of both lower extremities 01/23/2023   Encounter for general adult medical examination with abnormal findings 01/23/2023   Allergic rhinitis 01/23/2022   Asthmatic bronchitis, mild persistent, uncomplicated 01/23/2022   Upper airway cough syndrome 01/23/2022   Simple chronic bronchitis (HCC) 12/28/2021   Seasonal allergies 12/28/2021   DOE (  dyspnea on exertion)    Nosebleed 10/25/2014   Lung cancer (HCC) 02/05/2010   CAD (coronary artery disease), native coronary artery 02/05/2010   Mixed hyperlipidemia 08/16/2009   Essential hypertension 07/08/2009    ONSET DATE: 07/05/23  REFERRING DIAG: Z61.096 (ICD-10-CM) - Cerebrovascular accident (CVA) due to stenosis of right vertebral artery (HCC)  THERAPY DIAG:  Difficulty in  walking, not elsewhere classified - Plan: PT plan of care cert/re-cert  Cerebrovascular accident (CVA) due to stenosis of right vertebral artery (HCC) - Plan: PT plan of care cert/re-cert  Abnormality of gait due to impairment of balance - Plan: PT plan of care cert/re-cert  Rationale for Evaluation and Treatment: Rehabilitation  SUBJECTIVE:                                                                                                                                                                                             SUBJECTIVE STATEMENT: Overall "70%" better ; states he had some balance issues prior to stroke and is seeing neurology with hopes of having spinal tap done. Walked 20 minutes on the treadmill yesterday; sore today  Evaluation:  Here previously for outpatient PT Previously here for CVA treatment; he unfortunately had some sickness near end of his cert so got a new referral to return today.  Woke up nauseated and weak on right side 07/05/23; went to hospital later that day. After hospitalization was in inpatient rehab 7 days; d/c home 10/13; sees neurologist 2/11 [place] for spinal tap?   Right side weakness; left hand dominant ; still having some issues with balance; having some tinnitus; having some occasional issues with being able to focus his eyes with turning his head to change what he is looking at.   Pt accompanied by: self  PERTINENT HISTORY: chronic bronchiitis  PAIN:  Are you having pain? No  PRECAUTIONS: Fall     WEIGHT BEARING RESTRICTIONS: No  FALLS: Has patient fallen in last 6 months? No  PLOF: Independent  PATIENT GOALS: get back to normal  OBJECTIVE:  Note: Objective measures were completed at Evaluation unless otherwise noted.  DIAGNOSTIC FINDINGS: no new  COGNITION: Overall cognitive status: Within functional limits for tasks assessed   SENSATION: WFL  COORDINATION: Mild decreased right versus left side   MUSCLE TONE: appears  wnl    POSTURE: rounded shoulders and forward head   LOWER EXTREMITY MMT:    MMT Right Eval Left Eval Right 11/20/23 Left 11/20/23  Hip flexion 4+ 5 5 5   Hip extension      Hip abduction      Hip adduction  Hip internal rotation      Hip external rotation      Knee flexion      Knee extension 4+ 5 5 5   Ankle dorsiflexion 4+ 5 5 5   Ankle plantarflexion      Ankle inversion      Ankle eversion      (Blank rows = not tested)   STAIRS: Level of Assistance: Modified independence Stair Negotiation Technique: Alternating Pattern  with Single Rail on Right Number of Stairs: 4  Height of Stairs: 7"  Comments: slight decreased speed  GAIT: Gait pattern: decreased stance time- Right and wide BOS Distance walked: 100 ft in clinic Assistive device utilized: None Level of assistance: Modified independence Comments: occasional misstep  FUNCTIONAL TESTS:  5 times sit to stand: 15.23 sec SLS  Right 3" and left 6" DGI 1. Gait level surface (2) Mild Impairment: Walks 20', uses assistive devices, slower speed, mild gait deviations. 2. Change in gait speed (2) Mild Impairment: Is able to change speed but demonstrates mild gait deviations, or not gait deviations but unable to achieve a significant change in velocity, or uses an assistive device. 3. Gait with horizontal head turns (1) Moderate Impairment: Performs head turns with moderate change in gait velocity, slows down, staggers but recovers, can continue to walk. 4. Gait with vertical head turns 1) Moderate Impairment: Performs head turns with moderate change in gait velocity, slows down, staggers but recovers, can continue to walk. 5. Gait and pivot turn (2) Mild Impairment: Pivot turns safely in > 3 seconds and stops with no loss of balance. 6. Step over obstacle (2) Mild Impairment: Is able to step over box, but must slow down and adjust steps to clear box safely. 7. Step around obstacles (2) Mild Impairment: Is able  to step around both cones, but must slow down and adjust steps to clear cones. 8. Stairs (2) Mild Impairment: Alternating feet, must use rail.  TOTAL SCORE: 14 / 24   PATIENT SURVEYS:  ABC scale 85.6% LEFS 72/80 90%                                                                                                                              TREATMENT DATE:  11/20/2023 Progress note BP 132/74 5 x sit to stand 12.73 sec using hands on thighs SLS 6" on right' 8" on left MMT's see above  DGI 1. Gait level surface (2) Mild Impairment: Walks 20', uses assistive devices, slower speed, mild gait deviations. 2. Change in gait speed (2) Mild Impairment: Is able to change speed but demonstrates mild gait deviations, or not gait deviations but unable to achieve a significant change in velocity, or uses an assistive device. 3. Gait with horizontal head turns (3) Normal: Performs head turns smoothly with no change in gait. 4. Gait with vertical head turns (3) Normal: Performs head turns smoothly with no change in gait. 5. Gait and pivot turn (3) Normal: Pivot  turns safely within 3 seconds and stops quickly with no loss of balance. 6. Step over obstacle (2) Mild Impairment: Is able to step over box, but must slow down and adjust steps to clear box safely. 7. Step around obstacles (2) Mild Impairment: Is able to step around both cones, but must slow down and adjust steps to clear cones. 8. Stairs (2) Mild Impairment: Alternating feet, must use rail.  TOTAL SCORE: 19 / 24 Discussion of continued care  11/12/23: Vitals:  seated position Lt arm BP 13:50 165/84 mmHg       14:07: 173/94 mmHg HR 67  Standing: Heel and toe raises 20X warm up UE flexion opposite LE march alternating 10x 5"  11/07/23 Heel and toe raises 20X warm up Lunges onto BOSU dome up 10x2 each no UE assist Step up/power up alternating on BOSU dome up 10x2 each Squat 2x 10 BOSU dome down with 1-2 UE's intermittent  Vector  stance 10x 5" [employer] 3Pl 5RT each direction Nustep x 5' [place] trail L6 resistance, 70 spm  11/05/23 Seated: 162/85 mmHg, 88 bpm   STS to heel raise 10x  Lunges onto BOSU 10x each Step up/power up alternating on BOSU 10x each Squat 2x 10 BOSU (flatside up) Vector stance 5x 5" [employer] 2Pl 3RT Nustep x 5' [place] trail L6 resistance, 70 spm  11/01/23 Standing: Heel raises on incline x 20 Toe raises on decline x 20 Hip vectors x 10 each side Sit to stand with tidal tank x 10 Foam beam tandem stance 2 x 30" each 6" step up no UE assist x 10 each Air squats 2 x 10 Nustep warm up; seat 10, level 5; 5 minutes UE/LE               10/29/23 Standing: Heelraises 15X       Hip abduction 2 x 15 each  Hip extension 2X15 each          March 2 x 15 each side  4" forward lunges 2X15 each no UE assist          4" lateral step ups 2 x 15 each   4" forward step ups 2X15 each Nustep warm up; seat 10, level 5; 5 minutes UE/LE                       10/25/23 physical therapy evaluation and HEP instruction    PATIENT EDUCATION: Education details: Patient educated on exam findings, POC, scope of PT, HEP, and what to expect next visit. Person educated: Patient Education method: Explanation, Demonstration, and Handouts Education comprehension: verbalized understanding, returned demonstration, verbal cues required, and tactile cues required  HOME EXERCISE PROGRAM: Access Code: [email or web address] URL: [email or web address] Date: 08/01/2023 Prepared by: [facility] - Rehab   Exercises - Sit to Stand  - 2 x daily - 7 x weekly - 1 sets - 10 reps - Standing Single Leg Stance with Counter Support  - 2 x daily - 7 x weekly - 1 sets - 5 reps - 10 sec hold  GOALS: Goals reviewed with patient? No  SHORT TERM GOALS: Target date: 11/08/2023  patient will be independent with initial HEP  Baseline: Goal status:met  2.  Patient will self report 30% improvement to improve tolerance for  functional activity  Baseline:  Goal status: met  LONG TERM GOALS: Target date: 11/22/2023  Patient will be independent in self management strategies to improve quality of life and functional outcomes.   Baseline:  Goal status: INITIAL  2.  Patient will self report 50% improvement to improve tolerance for functional activity  Baseline:  Goal status: met  3.  Patient will increase right leg MMT's to 5/5 to allow navigation of steps without gait deviation or loss of balance Baseline: see above Goal status: INITIAL  4.  Patient will be able to stand SLS x 10" each leg to demonstrate improved functional balance.   Baseline: see above; progressing Goal status: INITIAL  5.  Patient will increase DGI score by 4 points to demonstrate improved balance with functional activity Baseline: 14/24; 19/24 11/20/23 Goal status: met  ASSESSMENT:  CLINICAL IMPRESSION: Progress note today; patient with good progress; met goals for DGI and strength but still has not met SLS goal and has to use railing with steps; occasionally will have a misstep; drags his right foot as his fatigues.  Patient will benefit from continued therapy services to address remaining unmet balance goal and work on higher level balance activity; gait with more challenging activity.    Evaluation:Patient is a 73 y.o. male who was seen today for physical therapy evaluation and treatment for balance impairment and right leg weakness after CVA. Patient demonstrates decreased strength, balance deficits and gait abnormalities which are negatively impacting patient ability to perform ADLs and functional mobility tasks. Patient will benefit from skilled physical therapy services to address these deficits to improve level of function with ADLs, functional mobility tasks, and reduce risk for falls.    OBJECTIVE IMPAIRMENTS: Abnormal gait, decreased activity tolerance, decreased balance, decreased coordination, difficulty walking,  decreased strength, and impaired perceived functional ability.   ACTIVITY LIMITATIONS: carrying, lifting, bending, standing, stairs, and locomotion level  PARTICIPATION LIMITATIONS: meal prep, cleaning, shopping, community activity, and yard work  [facility] POTENTIAL: Good  CLINICAL DECISION MAKING: Evolving/moderate complexity  EVALUATION COMPLEXITY: Moderate  PLAN:  PT FREQUENCY: 2x/week  PT DURATION: 4 weeks  PLANNED INTERVENTIONS: 97164- PT Re-evaluation, 97110-Therapeutic exercises, 97530- Therapeutic activity, 97112- Neuromuscular re-education, 97535- Self Care, 40981- Manual therapy, [id number]- Gait training, [phone]- Orthotic Fit/training, [phone]- Canalith repositioning, [id number]- Aquatic Therapy, [phone]- Splinting, Patient/Family education, Balance training, Stair training, Taping, Dry Needling, Joint mobilization, Joint manipulation, Spinal manipulation, Spinal mobilization, Scar mobilization, and DME instructions.   PLAN FOR NEXT SESSION: Progress gait and balance; lower extremity strengthening. Continue 2 x a week for 4 weeks  2:29 PM, 11/20/23 Denell Cothern Small Naythen Heikkila MPT [place] [facility] [place] [phone]

## 2023-11-21 ENCOUNTER — Telehealth: Payer: Self-pay | Admitting: Acute Care

## 2023-11-21 MED ORDER — TRELEGY ELLIPTA 100-62.5-25 MCG/ACT IN AEPB: 1.0000 | INHALATION_SPRAY | Freq: Every day | RESPIRATORY_TRACT | 0 refills | Status: DC

## 2023-11-21 NOTE — Telephone Encounter (Signed)
Trelegy refill sent and I spoke with the pt and notified needs to be sure and keep upcoming appt  He verbalized understanding  Nothing further needed

## 2023-11-21 NOTE — Telephone Encounter (Signed)
Patient states needs refill for Trelegy inhaler. Patient scheduled 12/25/2023 with Kandice Robinsons NP. Patient out of medication. Pharmacy is [facility]. Patient phone number is [phone].

## 2023-11-28 ENCOUNTER — Ambulatory Visit (HOSPITAL_COMMUNITY): Payer: HMO

## 2023-11-28 ENCOUNTER — Ambulatory Visit (HOSPITAL_COMMUNITY): Payer: HMO | Admitting: Physical Therapy

## 2023-11-28 NOTE — Progress Notes (Deleted)
   Subjective:    Patient ID: Jeremy Lopez, male    DOB: 03-17-1951, 73 y.o.   MRN: [id number]  HPI An audio/video tele-health visit is felt to be the most appropriate encounter for this patient at this time. This is a follow up tele-visit via phone. The patient is at home. MD is at office. Prior to scheduling this appointment, our staff discussed the limitations of evaluation and management by telemedicine and the availability of in-person appointments. The patient expressed understanding and agreed to proceed.   Jeremy Lopez is a 73 year old man who presents to establish care for   1) Hiccups -not that often  2) Dysphagia -has some difficulty swallowing  3) Depression -has a little -wife gives him vitamin D every night  4) Incontinence -urinary   5) HTN -cardiologist increased his irbesartan to 300mg  -has communicated with his cardiologist  6) Impaired memory   Review of Systems     Objective:   Physical Exam PRIOR EXAM: Gen: no distress, normal appearing HEENT: oral mucosa pink and moist, NCAT Cardio: Reg rate Chest: normal effort, normal rate of breathing Abd: soft, non-distended Ext: no edema Psych: pleasant, normal affect Skin: intact Neuro: Alert and oriented x3, ambulating without AD, answers questions with good medical knowledge     Assessment & Plan:   1) Urinary urgency/incontinence: -flomax prescribed  2) HTN -flomax prescribed -discussed that he is taking irbesartan, advised increasing this dose to 300mg  as his cardiologist recommends rather than restarting diltiazem.   3) Depression:  -discussed that it is related to everybody feeling sorry for him. -refilled Lexapro -discussed vitamin D can be helpful  4) Impaired memory: -continue crosswords  5) Overweight: -discussed that he has lost 20 lbs -discussed his goal is to get to 185 lbs -discussed not forcing himself to eat when he is not hungry -discussed prioritizing protein -avoid  processed foods  Discussed that his weight conference improved from 42 to 38 -discussed that stopping beer and soda likely greatly helped  6) CVA: -advised walking after meals -advised walking outside with family -vitamin D level ordered  7) Dysphagia:  -discussed swallowing difficulties but that it is not a significant problem.  -advised chewing foods cautiously  5 minutes spent discussing that vitamin D supplement can help with depression, advised to increase irbesartan to 300mg  as cardiologist recommended rather than restart diltiazem

## 2023-11-29 ENCOUNTER — Encounter: Payer: HMO | Admitting: Physical Medicine and Rehabilitation

## 2023-12-03 ENCOUNTER — Encounter (HOSPITAL_COMMUNITY): Payer: Self-pay

## 2023-12-03 ENCOUNTER — Ambulatory Visit (HOSPITAL_COMMUNITY): Payer: HMO

## 2023-12-03 VITALS — BP 169/77 | HR 65

## 2023-12-03 DIAGNOSIS — R262 Difficulty in walking, not elsewhere classified: Secondary | ICD-10-CM

## 2023-12-03 DIAGNOSIS — R2689 Other abnormalities of gait and mobility: Secondary | ICD-10-CM

## 2023-12-03 DIAGNOSIS — I63211 Cerebral infarction due to unspecified occlusion or stenosis of right vertebral arteries: Secondary | ICD-10-CM

## 2023-12-03 NOTE — Therapy (Addendum)
 OUTPATIENT PHYSICAL THERAPY TREATMENT    Patient Name: ABDIAZIZ Lopez MRN: [id number] DOB:[birth year], 73 y.o., male Today's Date: 12/03/2023   PCP: Christel Mormon REFERRING PROVIDER: Pricilla Riffle, MD END OF SESSION:  PT End of Session - 12/03/23 1054     Visit Number 8    Number of Visits 16    Date for PT Re-Evaluation 12/20/23    Authorization Type [employer]    PT Start Time 1100    PT Stop Time 1144    PT Time Calculation (min) 44 min    Activity Tolerance Patient tolerated treatment well;Patient limited by fatigue    Behavior During Therapy [facility] for tasks assessed/performed              Past Medical History:  Diagnosis Date   CAD (coronary artery disease), native coronary artery    DES proximal LAD 07/2009; DES to RCA 04/2020   Chronic heart failure with preserved ejection fraction (HFpEF) (HCC)    Essential hypertension    Habitual alcohol use    Hyperlipidemia    PVC's (premature ventricular contractions)    Small cell lung cancer (HCC)    Right upper lobe 2008; no evidence of disease since treatment ended in 11/2007   Past Surgical History:  Procedure Laterality Date   CORONARY ATHERECTOMY N/A 04/26/2020   Procedure: CORONARY ATHERECTOMY;  Surgeon: Corky Crafts, MD;  Location: [facility] INVASIVE CV LAB;  Service: Cardiovascular;  Laterality: N/A;   CORONARY STENT INTERVENTION N/A 04/26/2020   Procedure: CORONARY STENT INTERVENTION;  Surgeon: Corky Crafts, MD;  Location: [facility] INVASIVE CV LAB;  Service: Cardiovascular;  Laterality: N/A;   CORONARY ULTRASOUND/IVUS N/A 04/26/2020   Procedure: Intravascular Ultrasound/IVUS;  Surgeon: Corky Crafts, MD;  Location: [facility] INVASIVE CV LAB;  Service: Cardiovascular;  Laterality: N/A;   Insertion of left subclavian Port-A-Cath  2008   LEFT HEART CATH AND CORONARY ANGIOGRAPHY N/A 07/03/2022   Procedure: LEFT HEART CATH AND CORONARY ANGIOGRAPHY;  Surgeon: Swazil[place], Peter M, MD;  Location: [facility] INVASIVE CV  LAB;  Service: Cardiovascular;  Laterality: N/A;   RIGHT/LEFT HEART CATH AND CORONARY ANGIOGRAPHY N/A 04/26/2020   Procedure: RIGHT/LEFT HEART CATH AND CORONARY ANGIOGRAPHY;  Surgeon: Corky Crafts, MD;  Location: [facility] INVASIVE CV LAB;  Service: Cardiovascular;  Laterality: N/A;   TEMPORARY PACEMAKER N/A 04/26/2020   Procedure: TEMPORARY PACEMAKER;  Surgeon: Corky Crafts, MD;  Location: [facility] INVASIVE CV LAB;  Service: Cardiovascular;  Laterality: N/A;   Video bronchoscopy and video mediastinoscopy  2008   Patient Active Problem List   Diagnosis Date Noted   Diarrhea 11/01/2023   Acute CVA (cerebrovascular accident) (HCC) 07/13/2023   Dizziness 07/12/2023   Cerebellar stroke, acute (HCC) 07/07/2023   CVA (cerebral vascular accident) (HCC) 07/05/2023   Headache 07/05/2023   Prediabetes 02/01/2023   CKD (chronic kidney disease) stage 2, GFR 60-89 ml/min 02/01/2023   Nausea 02/01/2023   Colon cancer screening 02/01/2023   CHF (congestive heart failure) (HCC) 01/23/2023   Numbness and tingling of both lower extremities 01/23/2023   Encounter for general adult medical examination with abnormal findings 01/23/2023   Allergic rhinitis 01/23/2022   Asthmatic bronchitis, mild persistent, uncomplicated 01/23/2022   Upper airway cough syndrome 01/23/2022   Simple chronic bronchitis (HCC) 12/28/2021   Seasonal allergies 12/28/2021   DOE (dyspnea on exertion)    Nosebleed 10/25/2014   Lung cancer (HCC) 02/05/2010   CAD (coronary artery disease), native coronary artery 02/05/2010   Mixed hyperlipidemia 08/16/2009  Essential hypertension 07/08/2009    ONSET DATE: 07/05/23  REFERRING DIAG: Z61.096 (ICD-10-CM) - Cerebrovascular accident (CVA) due to stenosis of right vertebral artery (HCC)  THERAPY DIAG:  Difficulty in walking, not elsewhere classified  Cerebrovascular accident (CVA) due to stenosis of right vertebral artery (HCC)  Abnormality of gait due to impairment of  balance  Rationale for Evaluation and Treatment: Rehabilitation  SUBJECTIVE:                                                                                                                                                                                             SUBJECTIVE STATEMENT: Pt states that he has felt good since the last session. He presents with 0/10 pain rating. Pt states that he never paid much attention to balance until he had the stroke, no reported falls.   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  Woke up nauseated and weak on right side 07/05/23; went to hospital later that day. After hospitalization was in inpatient rehab 7 days; d/c home 10/13; sees neurologist 2/11 [place] for spinal tap?   Right side weakness; left hand dominant ; still having some issues with balance; having some tinnitus; having some occasional issues with being able to focus his eyes with turning his head to change what he is looking at.   Pt accompanied by: self  PERTINENT HISTORY: chronic bronchiitis  PAIN:  Are you having pain? No  PRECAUTIONS: Fall     WEIGHT BEARING RESTRICTIONS: No  FALLS: Has patient fallen in last 6 months? No  PLOF: Independent  PATIENT GOALS: get back to normal  OBJECTIVE:  Note: Objective measures were completed at Evaluation unless otherwise noted.  DIAGNOSTIC FINDINGS: no new  COGNITION: Overall cognitive status: Within functional limits for tasks assessed   SENSATION: WFL  COORDINATION: Mild decreased right versus left side   MUSCLE TONE: appears wnl    POSTURE: rounded shoulders and forward head   LOWER EXTREMITY MMT:    MMT Right Eval Left Eval Right 11/20/23 Left 11/20/23  Hip flexion 4+ 5 5 5   Hip extension      Hip abduction      Hip adduction      Hip internal rotation      Hip external  rotation      Knee flexion      Knee extension 4+ 5 5 5   Ankle dorsiflexion 4+ 5 5 5   Ankle plantarflexion      Ankle inversion  Ankle eversion      (Blank rows = not tested)   STAIRS: Level of Assistance: Modified independence Stair Negotiation Technique: Alternating Pattern  with Single Rail on Right Number of Stairs: 4  Height of Stairs: 7"  Comments: slight decreased speed  GAIT: Gait pattern: decreased stance time- Right and wide BOS Distance walked: 100 ft in clinic Assistive device utilized: None Level of assistance: Modified independence Comments: occasional misstep  FUNCTIONAL TESTS:  5 times sit to stand: 15.23 sec SLS  Right 3" and left 6" DGI 1. Gait level surface (2) Mild Impairment: Walks 20', uses assistive devices, slower speed, mild gait deviations. 2. Change in gait speed (2) Mild Impairment: Is able to change speed but demonstrates mild gait deviations, or not gait deviations but unable to achieve a significant change in velocity, or uses an assistive device. 3. Gait with horizontal head turns (1) Moderate Impairment: Performs head turns with moderate change in gait velocity, slows down, staggers but recovers, can continue to walk. 4. Gait with vertical head turns 1) Moderate Impairment: Performs head turns with moderate change in gait velocity, slows down, staggers but recovers, can continue to walk. 5. Gait and pivot turn (2) Mild Impairment: Pivot turns safely in > 3 seconds and stops with no loss of balance. 6. Step over obstacle (2) Mild Impairment: Is able to step over box, but must slow down and adjust steps to clear box safely. 7. Step around obstacles (2) Mild Impairment: Is able to step around both cones, but must slow down and adjust steps to clear cones. 8. Stairs (2) Mild Impairment: Alternating feet, must use rail.  TOTAL SCORE: 14 / 24   PATIENT SURVEYS:  ABC scale 85.6% LEFS 72/80 90%                                                                                                                               TREATMENT DATE: 12/03/23 BP: 164/88 reported this morning, 169/77 at beginning of session HR: 65 bpm Therex: Heel and toe raises 20X warm up Lunges onto BOSU dome up 10x2 bilateral, (one UE, no UE assist) Leg Press 4 sets of 10 reps (1,4,4,4), pt cued to avoid max knee extension Nustep, 6 minutes, level 6 resistance, pt cued for 75 spm  Neuro-Re: SLS L: 4s , 7s, 23s R: 5s, 8s, 4s  Aeromat tandem walking in parallel bars, 3 laps with SBA and UE support when needed, pts performance noted to improve on final lap  Theract: Stair ambulation  -3 sets of 4 steps ascending and descending, reciprocating gait with one UE support  (right foot noted to be landing early compared to left) -2 sets of 4 steps ascending normal and descending backwards Ankle weight walking (3lbs), 60 feet, 2 trials Ankle weight walking high knees (3lbs), 30 feet, 1 trial Ankle weight walking butt kicks (3lbs), 30 feet, 1 trial  11/20/2023 Progress note BP 132/74 5 x sit to stand  12.73 sec using hands on thighs SLS 6" on right' 8" on left MMT's see above  DGI 1. Gait level surface (2) Mild Impairment: Walks 20', uses assistive devices, slower speed, mild gait deviations. 2. Change in gait speed (2) Mild Impairment: Is able to change speed but demonstrates mild gait deviations, or not gait deviations but unable to achieve a significant change in velocity, or uses an assistive device. 3. Gait with horizontal head turns (3) Normal: Performs head turns smoothly with no change in gait. 4. Gait with vertical head turns (3) Normal: Performs head turns smoothly with no change in gait. 5. Gait and pivot turn (3) Normal: Pivot turns safely within 3 seconds and stops quickly with no loss of balance. 6. Step over obstacle (2) Mild Impairment: Is able to step over box, but must slow down and adjust steps to clear box safely. 7. Step around  obstacles (2) Mild Impairment: Is able to step around both cones, but must slow down and adjust steps to clear cones. 8. Stairs (2) Mild Impairment: Alternating feet, must use rail.  TOTAL SCORE: 19 / 24 Discussion of continued care  11/12/23: Vitals:  seated position Lt arm BP 13:50 165/84 mmHg       14:07: 173/94 mmHg HR 67  Standing: Heel and toe raises 20X warm up UE flexion opposite LE march alternating 10x 5"    PATIENT EDUCATION: Education details: Patient educated on POC and importance of HEP. Pt was also educated on balance and the contributing factors that are involved with improving balance. Person educated: Patient Education method: Explanation, and Demonstration Education comprehension: verbalized understanding, returned demonstration, verbal cues required, and tactile cues required  HOME EXERCISE PROGRAM: Access Code: [email or web address] URL: [email or web address] Date: 08/01/2023 Prepared by: [facility] - Rehab   Exercises - Sit to Stand  - 2 x daily - 7 x weekly - 1 sets - 10 reps - Standing Single Leg Stance with Counter Support  - 2 x daily - 7 x weekly - 1 sets - 5 reps - 10 sec hold  GOALS: Goals reviewed with patient? No  SHORT TERM GOALS: Target date: 11/08/2023  patient will be independent with initial HEP  Baseline: Goal status:met  2.  Patient will self report 30% improvement to improve tolerance for functional activity  Baseline:  Goal status: met  LONG TERM GOALS: Target date: 11/22/2023  Patient will be independent in self management strategies to improve quality of life and functional outcomes.   Baseline:  Goal status: INITIAL  2.  Patient will self report 50% improvement to improve tolerance for functional activity  Baseline:  Goal status: met  3.  Patient will increase right leg MMT's to 5/5 to allow navigation of steps without gait deviation or loss of balance Baseline: see above Goal status: INITIAL  4.  Patient will be able  to stand SLS x 10" each leg to demonstrate improved functional balance.   Baseline: see above; progressing Goal status: INITIAL  5.  Patient will increase DGI score by 4 points to demonstrate improved balance with functional activity Baseline: 14/24; 19/24 11/20/23 Goal status: met  ASSESSMENT:  CLINICAL IMPRESSION: Pt continues to demonstrate decreased strength and endurance of RLE compared to the LLE especially during SLS activities. Pt is continuing to demonstrate good progress towards therapy goals and is able to correct dragging feet during walking after one verbal cue today. Patient would continue to benefit from skilled physical therapy for increased strength of RLE, improved community ambulation, balance and  continued progress towards therapy goals.     Evaluation:Patient is a 73 y.o. male who was seen today for physical therapy evaluation and treatment for balance impairment and right leg weakness after CVA. Patient demonstrates decreased strength, balance deficits and gait abnormalities which are negatively impacting patient ability to perform ADLs and functional mobility tasks. Patient will benefit from skilled physical therapy services to address these deficits to improve level of function with ADLs, functional mobility tasks, and reduce risk for falls.    OBJECTIVE IMPAIRMENTS: Abnormal gait, decreased activity tolerance, decreased balance, decreased coordination, difficulty walking, decreased strength, and impaired perceived functional ability.   ACTIVITY LIMITATIONS: carrying, lifting, bending, standing, stairs, and locomotion level  PARTICIPATION LIMITATIONS: meal prep, cleaning, shopping, community activity, and yard work  [facility] POTENTIAL: Good  CLINICAL DECISION MAKING: Evolving/moderate complexity  EVALUATION COMPLEXITY: Moderate  PLAN:  PT FREQUENCY: 2x/week  PT DURATION: 4 weeks  PLANNED INTERVENTIONS: 97164- PT Re-evaluation, 97110-Therapeutic exercises,  97530- Therapeutic activity, 97112- Neuromuscular re-education, 97535- Self Care, [id number]- Manual therapy, [id number]- Gait training, [phone]- Orthotic Fit/training, [phone]- Canalith repositioning, [id number]- Aquatic Therapy, [phone]- Splinting, Patient/Family education, Balance training, Stair training, Taping, Dry Needling, Joint mobilization, Joint manipulation, Spinal manipulation, Spinal mobilization, Scar mobilization, and DME instructions.   PLAN FOR NEXT SESSION: Progress gait and balance as tolerated; lower extremity strengthening. Consider walking endurance circuit with ankle weights and educated pt on benefits of initiating walking program.  Luz Lex, PT, DPT 12/03/2023

## 2023-12-05 ENCOUNTER — Ambulatory Visit (HOSPITAL_COMMUNITY): Payer: HMO | Admitting: Physical Therapy

## 2023-12-10 ENCOUNTER — Ambulatory Visit (HOSPITAL_COMMUNITY): Payer: HMO | Admitting: Physical Therapy

## 2023-12-12 ENCOUNTER — Ambulatory Visit (HOSPITAL_COMMUNITY): Payer: HMO | Admitting: Physical Therapy

## 2023-12-16 ENCOUNTER — Ambulatory Visit (HOSPITAL_COMMUNITY): Payer: HMO

## 2023-12-17 ENCOUNTER — Encounter: Payer: HMO | Attending: Physical Medicine and Rehabilitation | Admitting: Physical Medicine and Rehabilitation

## 2023-12-17 ENCOUNTER — Encounter: Payer: Self-pay | Admitting: Physical Medicine and Rehabilitation

## 2023-12-17 VITALS — BP 157/73 | HR 61 | Ht 70.0 in | Wt 196.0 lb

## 2023-12-17 DIAGNOSIS — R1310 Dysphagia, unspecified: Secondary | ICD-10-CM | POA: Diagnosis present

## 2023-12-17 DIAGNOSIS — E663 Overweight: Secondary | ICD-10-CM | POA: Diagnosis present

## 2023-12-17 DIAGNOSIS — I10 Essential (primary) hypertension: Secondary | ICD-10-CM | POA: Diagnosis not present

## 2023-12-17 DIAGNOSIS — F32A Depression, unspecified: Secondary | ICD-10-CM

## 2023-12-17 DIAGNOSIS — R413 Other amnesia: Secondary | ICD-10-CM | POA: Diagnosis not present

## 2023-12-17 MED ORDER — METFORMIN HCL ER (OSM) 500 MG PO TB24: 500.0000 mg | ORAL_TABLET | Freq: Every day | ORAL | 3 refills | Status: DC

## 2023-12-17 NOTE — Progress Notes (Signed)
   Subjective:    Patient ID: Jeremy Lopez, male    DOB: 1951-05-09, 73 y.o.   MRN: [id number]  HPI  Jeremy Lopez is a 73 year old man who presents for follow-up of hypertension.   1) Hiccups -not that often  2) Dysphagia -has some difficulty swallowing  3) Depression -has a little -wife gives him vitamin D every night  4) Incontinence -urinary   5) HTN -cardiologist increased his irbesartan to 300mg  -has communicated with his cardiologist -he takes flomax 0.4mg  at night -eats a pretty healthy diet -his wife helps his daughter babysitting -eats salmon  6) Impaired memory   Review of Systems     Objective:   Physical Exam Gen: no distress, normal appearing HEENT: oral mucosa pink and moist, NCAT Cardio: Reg rate Chest: normal effort, normal rate of breathing Abd: soft, non-distended Ext: no edema Psych: pleasant, normal affect Skin: intact Neuro: Alert and oriented x3, ambulating without AD, answers questions with good medical knowledge     Assessment & Plan:   1) Urinary urgency/incontinence: -flomax prescribed  2) HTN -flomax prescribed, continue -discussed that he is taking irbesartan, advised increasing this dose to 300mg  as his cardiologist recommends rather than restarting diltiazem.  -discussed that BP increases to SBP 170s/180s at night, discussed that he   3) Depression:  -discussed that this is better -discussed that he enjoys watching TV -discussed that they have a treadmill in their basement -encouraged walking outside -discussed that it is related to everybody feeling sorry for him. -refilled Lexapro -discussed vitamin D can be helpful  4) Impaired memory: -continue crosswords -discussed outpatient SLP  5) Overweight: -discussed that he has lost 20 lbs -discussed risks and benefits of berberine and metformin  -discussed his goal is to get to 185 lbs -discussed not forcing himself to eat when he is not hungry -discussed  prioritizing protein -avoid processed foods  -discussed that goal has been stable -discussed that stopping beer and soda likely greatly helped  6) CVA: -advised walking after meals -advised walking outside with family -vitamin D level ordered -continue PT/OT  7) Dysphagia:  -discussed that this has resolved -discussed swallowing difficulties but that it is not a significant problem.  -advised chewing foods cautiously

## 2023-12-17 NOTE — Patient Instructions (Addendum)
 Berberine 500, green tea  brassica

## 2023-12-18 ENCOUNTER — Ambulatory Visit (HOSPITAL_COMMUNITY)

## 2023-12-25 ENCOUNTER — Encounter: Payer: Self-pay | Admitting: Acute Care

## 2023-12-25 ENCOUNTER — Ambulatory Visit: Payer: HMO | Admitting: Acute Care

## 2023-12-25 VITALS — BP 138/76 | HR 68 | Ht 70.0 in | Wt 200.6 lb

## 2023-12-25 DIAGNOSIS — J449 Chronic obstructive pulmonary disease, unspecified: Secondary | ICD-10-CM | POA: Diagnosis not present

## 2023-12-25 DIAGNOSIS — Z85118 Personal history of other malignant neoplasm of bronchus and lung: Secondary | ICD-10-CM | POA: Diagnosis not present

## 2023-12-25 DIAGNOSIS — C3490 Malignant neoplasm of unspecified part of unspecified bronchus or lung: Secondary | ICD-10-CM

## 2023-12-25 MED ORDER — TRELEGY ELLIPTA 100-62.5-25 MCG/ACT IN AEPB
1.0000 | INHALATION_SPRAY | Freq: Every day | RESPIRATORY_TRACT | 10 refills | Status: DC
Start: 2023-12-25 — End: 2024-03-03

## 2023-12-25 MED ORDER — TRELEGY ELLIPTA 100-62.5-25 MCG/ACT IN AEPB
1.0000 | INHALATION_SPRAY | Freq: Every day | RESPIRATORY_TRACT | 10 refills | Status: AC
Start: 2023-12-25 — End: ?

## 2023-12-25 NOTE — Progress Notes (Signed)
 History of Present Illness Jeremy Lopez is a 73 y.o. male former smoker referred in February 2023 for history of lung cancer by Jeremy Perches, MD . Followed by Dr. Tonia Lopez. Will need to be reassigned  Synopsis 73 year old gentleman, history of coronary disease, DES to LAD in 2010, essential hypertension, hyperlipidemia small cell lung cancer right upper lobe 2008. Treatment ended in February 2009. For the past 10 years, and worse since this past September had recurrent bronchitis. Currently not really seeing any one regarding his lung ca. In September of 2022 had another episode of bronchitis treated with ABX. The congestion seems to be the biggest problem. He was treated with 3 separate rounds of abx. He has been using albuterol and Flonase. Former smoker, heavy smoker, quit at age 73, starting smoking at age 27 years old. Doing better on Trelegy    12/25/2023 [name]. He states he has been doing well from a respiratory standpoint.. He had a stroke in September 2024. He is still a bit weak on his right side. He feels his short term memory has not recovered.He is being followed by neurology.  He states he has sputum in his airways, and frequent bronchitis.  He denies any chest congestion, secretions are clear. No fever. He is at his baseline . He states Trelegy has made a big difference in his respiratory status.  He has not had a CT Chest since 2023. With his history of lung cancer he should be having surveillance imaging. We will schedule now. He does not have hemoptysis or weight loss. He is compliant with Trelegy. He does need his Trelegy renewed , which I will do today.   Test Results: CT Chest 06/2022 There are linear patchy density seen in the medial aspect of superior segmental right lower lobe and along the medial aspect of right upper lobe close to the mediastinum. There is mild ectasia of bronchi in the areas of scarring. These findings appears stable, possibly  suggesting scarring from previous radiation treatment. There are no new infiltrates or new nodules in the lung fields. There is no pleural effusion or pneumothorax.       Latest Ref Rng & Units 07/15/2023    5:56 AM 07/12/2023    7:02 AM 07/06/2023    6:02 AM  CBC  WBC 4.0 - 10.5 K/uL 6.5  8.2  9.4   Hemoglobin 13.0 - 17.0 g/dL 66.4  40.3  47.4   Hematocrit 39.0 - 52.0 % 39.6  40.7  42.7   Platelets 150 - 400 K/uL 356  351  298        Latest Ref Rng & Units 07/15/2023    5:56 AM 07/12/2023    7:02 AM 07/06/2023    6:02 AM  BMP  Glucose 70 - 99 mg/dL 78  94  95   BUN 8 - 23 mg/dL 20  21  18    Creatinine 0.61 - 1.24 mg/dL 2.59  5.63  8.75   Sodium 135 - 145 mmol/L 133  135  134   Potassium 3.5 - 5.1 mmol/L 3.6  4.0  3.6   Chloride 98 - 111 mmol/L 99  100  99   CO2 22 - 32 mmol/L 25  26  25    Calcium 8.9 - 10.3 mg/dL 8.3  8.6  8.5     BNP    Component Value Date/Time   BNP 54.0 06/13/2022 1458    ProBNP    Component Value Date/Time  PROBNP 104 02/08/2021 1318    PFT    Component Value Date/Time   FEV1PRE 2.73 01/23/2022 0952   FEV1POST 2.87 01/23/2022 0952   FVCPRE 3.87 01/23/2022 0952   FVCPOST 3.96 01/23/2022 0952   TLC 6.30 01/23/2022 0952   DLCOUNC 19.52 01/23/2022 0952   PREFEV1FVCRT 70 01/23/2022 0952   PSTFEV1FVCRT 73 01/23/2022 0952    No results found.   Past medical hx Past Medical History:  Diagnosis Date   CAD (coronary artery disease), native coronary artery    DES proximal LAD 07/2009; DES to RCA 04/2020   Chronic heart failure with preserved ejection fraction (HFpEF) (HCC)    Essential hypertension    Habitual alcohol use    Hyperlipidemia    PVC's (premature ventricular contractions)    Small cell lung cancer (HCC)    Right upper lobe 2008; no evidence of disease since treatment ended in 11/2007     Social History   Tobacco Use   Smoking status: Former    Current packs/day: 0.00    Types: Cigarettes    Start date: 10/25/1966     Quit date: 10/25/1998    Years since quitting: 25.1    Passive exposure: Past   Smokeless tobacco: Never  Vaping Use   Vaping status: Never Used  Substance Use Topics   Alcohol use: Yes    Alcohol/week: 14.0 standard drinks of alcohol    Types: 14 Cans of beer per week    Comment: 1 drink per day   Drug use: Not Currently    Types: Marijuana    JeremyJeremy Lopez reports that he quit smoking about 25 years ago. His smoking use included cigarettes. He started smoking about 57 years ago. He has been exposed to tobacco smoke. He has never used smokeless tobacco. He reports current alcohol use of about 14.0 standard drinks of alcohol per week. He reports that he does not currently use drugs after having used the following drugs: Marijuana.  Tobacco Cessation: Counseling given: Not Answered   Past surgical hx, Family hx, Social hx all reviewed.  Current Outpatient Medications on File Prior to Visit  Medication Sig   acetaminophen (TYLENOL) 325 MG tablet Take 1-2 tablets (325-650 mg total) by mouth every 4 (four) hours as needed for mild pain.   Blood Pressure Monitoring (OMRON 3 SERIES BP MONITOR) DEVI As directed   chlorpheniramine-HYDROcodone (TUSSIONEX) 10-8 MG/5ML Take 5 mLs by mouth every 12 (twelve) hours as needed for cough.   Cholecalciferol (VITAMIN D) 50 MCG (2000 UT) tablet Take 2,000 Units by mouth daily.   clopidogrel (PLAVIX) 75 MG tablet Take 1 tablet (75 mg total) by mouth daily.   diphenoxylate-atropine (LOMOTIL) 2.5-0.025 MG tablet Take 1 tablet by mouth 4 (four) times daily as needed for diarrhea or loose stools.   empagliflozin (JARDIANCE) 10 MG TABS tablet Take 1 tablet (10 mg total) by mouth daily.   escitalopram (LEXAPRO) 10 MG tablet Take 1 tablet (10 mg total) by mouth daily.   Fluticasone-Umeclidin-Vilant (TRELEGY ELLIPTA) 100-62.5-25 MCG/ACT AEPB Inhale 1 puff into the lungs daily.   gabapentin (NEURONTIN) 300 MG capsule Take 1 capsule (300 mg total) by mouth at  bedtime.   irbesartan (AVAPRO) 300 MG tablet Take 1 tablet (300 mg total) by mouth daily.   l-methylfolate-B6-B12 (METANX) 3-35-2 MG TABS tablet Take 1 tablet by mouth daily.   magnesium oxide (MAG-OX) 400 MG tablet Take 1.5 tablets (600 mg total) by mouth at bedtime.   metformin (FORTAMET) 500 MG (OSM) 24  hr tablet Take 1 tablet (500 mg total) by mouth daily with breakfast.   metoprolol succinate (TOPROL-XL) 100 MG 24 hr tablet Take 1 tablet (100 mg total) by mouth daily. Take with or immediately following a meal.   nitroGLYCERIN (NITROSTAT) 0.4 MG SL tablet Place 1 tablet (0.4 mg total) under the tongue every 5 (five) minutes as needed for chest pain.   prochlorperazine (COMPAZINE) 10 MG tablet Take 1 tablet (10 mg total) by mouth every 6 (six) hours as needed for nausea or vomiting.   rosuvastatin (CRESTOR) 40 MG tablet TAKE ONE (1) TABLET BY MOUTH EVERY DAY   tamsulosin (FLOMAX) 0.4 MG CAPS capsule Take 1 capsule (0.4 mg total) by mouth daily after supper.   traZODone (DESYREL) 50 MG tablet Take 1 tablet (50 mg total) by mouth at bedtime.   No current facility-administered medications on file prior to visit.     Allergies  Allergen Reactions   Cephalexin Anaphylaxis    throat swelling Patient said he can take amoxicillin   Amlodipine Swelling    Leg swelling    Review Of Systems:  Constitutional:   No  weight loss, night sweats,  Fevers, chills, fatigue, or  lassitude.  HEENT:   No headaches,  Difficulty swallowing,  Tooth/dental problems, or  Sore throat,                No sneezing, itching, ear ache, nasal congestion, post nasal drip,   CV:  No chest pain,  Orthopnea, PND, swelling in lower extremities, anasarca, dizziness, palpitations, syncope.   GI  No heartburn, indigestion, abdominal pain, nausea, vomiting, diarrhea, change in bowel habits, loss of appetite, bloody stools.   Resp: No shortness of breath with exertion or at rest.  No excess mucus, no productive cough,  No  non-productive cough,  No coughing up of blood.  No change in color of mucus.  No wheezing.  No chest wall deformity, + upper chest / airway drainage  Skin: no rash or lesions.  GU: no dysuria, change in color of urine, no urgency or frequency.  No flank pain, no hematuria   MS:  No joint pain or swelling.  No decreased range of motion.  No back pain.  Psych:  No change in mood or affect. No depression or anxiety.  No memory loss.   Vital Signs BP 138/76 (BP Location: Left Arm, Patient Position: Sitting, Cuff Size: Large)   Pulse 68   Ht 5\' 10"  (1.778 m)   Wt 200 lb 9.6 oz (91 kg)   SpO2 98%   BMI 28.78 kg/m    Physical Exam:  General- No distress,  A&Ox3, pleasant ENT: No sinus tenderness, TM clear, pale nasal mucosa, no oral exudate,no post nasal drip, no LAN Cardiac: S1, S2, regular rate and rhythm, no murmur Chest: No wheeze/ rales/ dullness; no accessory muscle use, no nasal flaring, no sternal retractions Abd.: Soft Non-tender, ND, BS +, Body mass index is 28.78 kg/m.  Ext: No clubbing cyanosis, edema Neuro:  normal strength,slightly weaker on right ,  MAE x 4, A&O x 3, states he has worsening short term memory after stroke Skin: No rashes, warm and dry, No obvious lesions  Psych: normal mood and behavior   Assessment/Plan History Lung Cancer COPD Recent Embolic Stroke Plan We have ordered CT chest for lung cancer surveillance. You will get a call to get this scheduled.  Follow up video visit to review results Continue trelegy daily as you have been doing, I  have renewed your prescription for trelegy Call if you need [place] sooner.  Please contact office for sooner follow up if symptoms do not improve or worsen or seek emergency care    I spent 30 minutes dedicated to the care of this patient on the date of this encounter to include pre-visit review of records, face-to-face time with the patient discussing conditions above, post visit ordering of testing, clinical  documentation with the electronic health record, making appropriate referrals as documented, and communicating necessary information to the patient's healthcare team.   Bevelyn Ngo, NP 12/25/2023  11:49 AM

## 2023-12-25 NOTE — Patient Instructions (Addendum)
 It is good to see you today. We have ordered CT chest . You will get a call to get this scheduled.  Follow up video visit to review results Continue trelegy daily as you have been doing, I have renewed your prescription / Call if you need [place] sooner.  Please contact office for sooner follow up if symptoms do not improve or worsen or seek emergency care

## 2024-01-02 ENCOUNTER — Ambulatory Visit (INDEPENDENT_AMBULATORY_CARE_PROVIDER_SITE_OTHER): Admitting: Internal Medicine

## 2024-01-02 ENCOUNTER — Encounter: Payer: Self-pay | Admitting: Internal Medicine

## 2024-01-02 VITALS — BP 143/65 | HR 62 | Ht 70.0 in | Wt 199.6 lb

## 2024-01-02 DIAGNOSIS — I10 Essential (primary) hypertension: Secondary | ICD-10-CM

## 2024-01-02 DIAGNOSIS — J4521 Mild intermittent asthma with (acute) exacerbation: Secondary | ICD-10-CM

## 2024-01-02 DIAGNOSIS — J208 Acute bronchitis due to other specified organisms: Secondary | ICD-10-CM

## 2024-01-02 DIAGNOSIS — B9689 Other specified bacterial agents as the cause of diseases classified elsewhere: Secondary | ICD-10-CM | POA: Diagnosis not present

## 2024-01-02 MED ORDER — DILTIAZEM HCL ER COATED BEADS 180 MG PO CP24
180.0000 mg | ORAL_CAPSULE | Freq: Every day | ORAL | 3 refills | Status: DC
Start: 2024-01-02 — End: 2024-03-26

## 2024-01-02 MED ORDER — HYDROCOD POLI-CHLORPHE POLI ER 10-8 MG/5ML PO SUER: 5.0000 mL | Freq: Two times a day (BID) | ORAL | 0 refills | Status: DC | PRN

## 2024-01-02 MED ORDER — AZITHROMYCIN 250 MG PO TABS
ORAL_TABLET | ORAL | 0 refills | Status: DC
Start: 2024-01-02 — End: 2024-03-16

## 2024-01-02 NOTE — Patient Instructions (Signed)
 It was a pleasure to see you today.  Thank you for giving [place] the opportunity to be involved in your care.  Below is a brief recap of your visit and next steps.  We will plan to see you again in July.  Summary Z pak and cough medication refilled Increase diltiazem to 180 mg daily Follow up as scheduled in July

## 2024-01-02 NOTE — Progress Notes (Signed)
 Acute Office Visit  Subjective:     Patient ID: Jeremy Lopez, male    DOB: May 04, 1951, 73 y.o.   MRN: [id number]  Chief Complaint  Patient presents with   Bronchitis    Patient feels he has bronchitis, has been going on for four days   Jeremy Lopez presents today for an acute visit with concern for URI and uncontrolled hypertension.  He endorses a 4-day history of cough productive of yellow sputum.  He is concerned that he may have bronchitis.  He is unaware of any recent sick contacts, denies fever/chills and significant sinus congestion.  He has tried taking over-the-counter medications for cough relief without significant improvement.  His additional concern is uncontrolled hypertension.  His blood pressure remains mildly elevated today at 143/65.  His antihypertensive regimen was previously reduced in the setting of hypotension during hospital admission for CVA in October 2024.  Review of Systems  Constitutional:  Negative for chills and fever.  HENT:  Negative for congestion.   Respiratory:  Positive for cough and sputum production.       Objective:    BP (!) 143/65 (BP Location: Left Arm, Patient Position: Sitting, Cuff Size: Normal)   Pulse 62   Ht 5\' 10"  (1.778 m)   Wt 199 lb 9.6 oz (90.5 kg)   SpO2 94%   BMI 28.64 kg/m  BP Readings from Last 3 Encounters:  01/02/24 (!) 143/65  12/25/23 138/76   Physical Exam Vitals reviewed.  Constitutional:      General: He is not in acute distress.    Appearance: Normal appearance. He is obese. He is not ill-appearing.  HENT:     Head: Normocephalic and atraumatic.     Right Ear: External ear normal.     Left Ear: External ear normal.     Nose: Nose normal. No congestion or rhinorrhea.     Mouth/Throat:     Mouth: Mucous membranes are moist.     Pharynx: Oropharynx is clear.  Eyes:     General: No scleral icterus.    Extraocular Movements: Extraocular movements intact.     Conjunctiva/sclera: Conjunctivae normal.      Pupils: Pupils are equal, round, and reactive to light.  Cardiovascular:     Rate and Rhythm: Normal rate and regular rhythm.     Pulses: Normal pulses.     Heart sounds: Normal heart sounds. No murmur heard. Pulmonary:     Effort: Pulmonary effort is normal.     Breath sounds: Rhonchi (bilateral) present. No wheezing or rales.  Abdominal:     General: Abdomen is flat. Bowel sounds are normal. There is no distension.     Palpations: Abdomen is soft.     Tenderness: There is no abdominal tenderness.  Musculoskeletal:        General: No swelling or deformity. Normal range of motion.     Cervical back: Normal range of motion.  Skin:    General: Skin is warm and dry.     Capillary Refill: Capillary refill takes less than 2 seconds.  Neurological:     General: No focal deficit present.     Mental Status: He is alert and oriented to person, place, and time.     Cranial Nerves: No cranial nerve deficit.     Sensory: No sensory deficit.     Motor: No weakness.     Coordination: Coordination abnormal.     Gait: Gait abnormal.  Psychiatric:  Mood and Affect: Mood normal.        Behavior: Behavior normal.        Thought Content: Thought content normal.       Assessment & Plan:   Problem List Items Addressed This Visit       Essential hypertension - Primary   His additional concern today is uncontrolled hypertension.  He is currently prescribed irbesartan  300 mg daily, diltiazem  120 mg daily, and Toprol  XL 100 mg daily.  His regimen was previously reduced in the setting of hypotension during hospital admission for CVA.  Irbesartan  recently increased to 300 mg daily.  BP in office today is 143/65.  Through shared decision making, will increase diltiazem  to 180 mg daily.  He will return to care for previously scheduled follow-up in July.      Acute bacterial bronchitis   Presenting today for an acute visit endorsing a 4-day history of cough productive of yellow sputum.  Bilateral  rhonchi noted on exam.  He is concerned that he is developing bronchitis.  Will empirically treat with Z-Pak and add Tussionex for cough relief at night as this has worked well previously.  He will return to care if symptoms worsen or fail to improve.      Meds ordered this encounter  Medications   diltiazem  (CARDIZEM  CD) 180 MG 24 hr capsule    Sig: Take 1 capsule (180 mg total) by mouth daily.    Dispense:  90 capsule    Refill:  3   azithromycin  (ZITHROMAX  Z-PAK) 250 MG tablet    Sig: Take 2 tablets (500 mg) PO today, then 1 tablet (250 mg) PO daily x4 days.    Dispense:  6 tablet    Refill:  0   chlorpheniramine-HYDROcodone  (TUSSIONEX) 10-8 MG/5ML    Sig: Take 5 mLs by mouth every 12 (twelve) hours as needed for cough.    Dispense:  115 mL    Refill:  0    Return if symptoms worsen or fail to improve.  Jeremy Fortes, MD

## 2024-01-16 ENCOUNTER — Telehealth: Payer: Self-pay | Admitting: Diagnostic Neuroimaging

## 2024-01-16 DIAGNOSIS — G912 (Idiopathic) normal pressure hydrocephalus: Secondary | ICD-10-CM

## 2024-01-16 DIAGNOSIS — R269 Unspecified abnormalities of gait and mobility: Secondary | ICD-10-CM

## 2024-01-16 NOTE — Telephone Encounter (Signed)
 Pt called wanting to know if he should still get the LP that was to happen before he had his stroke. Please advise.

## 2024-01-20 NOTE — Telephone Encounter (Signed)
 Spoke w/Pt to make him aware Dr. Salli Crawley wants him to get the LP. Pt stated understanding saying he wanted to make sure it was ok to get and he will call [facility] imaging to schedule.

## 2024-01-20 NOTE — Telephone Encounter (Signed)
 I also sent Adonica Hoose at GI a message to get him scheduled. [phone]

## 2024-01-20 NOTE — Telephone Encounter (Signed)
 Jeremy Lopez said she needs a new order put in for the LP because it says Jeremy Lopez is the ordering provider and it needs to be Dr. Salli Crawley. It won't let her schedule this way. I see he's not here now, it just needs to be ordered by a provider thanks.

## 2024-01-20 NOTE — Addendum Note (Signed)
 Addended by: Elton Ham on: 01/20/2024 03:16 PM   Modules accepted: Orders

## 2024-01-24 NOTE — Discharge Instructions (Signed)
 Lumbar Puncture Discharge Instructions  Go home and rest quietly as needed. You may resume normal activities; however, do not exert yourself strongly or do any heavy lifting today and tomorrow.   DO NOT drive today.    You may resume your normal diet

## 2024-01-27 ENCOUNTER — Ambulatory Visit
Admission: RE | Admit: 2024-01-27 | Discharge: 2024-01-27 | Disposition: A | Discharge status: Home / Self Care | Source: Ambulatory Visit | Attending: Neurology | Admitting: Neurology

## 2024-01-27 VITALS — BP 193/100 | HR 75

## 2024-01-27 DIAGNOSIS — G912 (Idiopathic) normal pressure hydrocephalus: Secondary | ICD-10-CM

## 2024-01-27 DIAGNOSIS — R269 Unspecified abnormalities of gait and mobility: Secondary | ICD-10-CM

## 2024-01-28 ENCOUNTER — Telehealth: Payer: Self-pay | Admitting: Diagnostic Neuroimaging

## 2024-01-28 DIAGNOSIS — G912 (Idiopathic) normal pressure hydrocephalus: Secondary | ICD-10-CM

## 2024-01-28 NOTE — Telephone Encounter (Signed)
 [facility] called to provide results

## 2024-01-29 NOTE — Telephone Encounter (Signed)
 Good to hear. Will send referral to neurosurgery consult for consideration of VP shunting. -VRP

## 2024-01-29 NOTE — Addendum Note (Signed)
 Addended by: Gwendloyn Lemming R on: 01/29/2024 05:07 PM   Modules accepted: Orders

## 2024-01-30 ENCOUNTER — Telehealth: Payer: Self-pay | Admitting: Diagnostic Neuroimaging

## 2024-01-30 NOTE — Telephone Encounter (Signed)
 Referral for neurosurgery fax to [facility]. Phone: [phone], Fax: [phone]

## 2024-01-31 ENCOUNTER — Encounter: Payer: Self-pay | Admitting: Neurology

## 2024-02-03 ENCOUNTER — Ambulatory Visit (HOSPITAL_COMMUNITY)

## 2024-02-09 LAB — CSF CULTURE W GRAM STAIN
MICRO NUMBER:: 16353251
Result:: NO GROWTH
SPECIMEN QUALITY:: ADEQUATE

## 2024-02-09 LAB — CSF CELL COUNT WITH DIFFERENTIAL
RBC Count, CSF: 73 {cells}/uL — ABNORMAL HIGH
TOTAL NUCLEATED CELL: 3 {cells}/uL (ref 0–5)

## 2024-02-09 LAB — PROTEIN, CSF: Total Protein, CSF: 54 mg/dL (ref 15–60)

## 2024-02-09 LAB — GLUCOSE, CSF: Glucose, CSF: 58 mg/dL (ref 40–80)

## 2024-02-11 ENCOUNTER — Encounter: Payer: Self-pay | Admitting: Internal Medicine

## 2024-02-11 ENCOUNTER — Telehealth: Payer: Self-pay

## 2024-02-11 ENCOUNTER — Encounter (HOSPITAL_COMMUNITY): Payer: Self-pay

## 2024-02-11 ENCOUNTER — Ambulatory Visit (HOSPITAL_COMMUNITY): Admission: RE | Admit: 2024-02-11 | Source: Ambulatory Visit

## 2024-02-11 NOTE — Assessment & Plan Note (Signed)
 His additional concern today is uncontrolled hypertension.  He is currently prescribed irbesartan  300 mg daily, diltiazem  120 mg daily, and Toprol  XL 100 mg daily.  His regimen was previously reduced in the setting of hypotension during hospital admission for CVA.  Irbesartan  recently increased to 300 mg daily.  BP in office today is 143/65.  Through shared decision making, will increase diltiazem  to 180 mg daily.  He will return to care for previously scheduled follow-up in July.

## 2024-02-11 NOTE — Assessment & Plan Note (Signed)
 Presenting today for an acute visit endorsing a 4-day history of cough productive of yellow sputum.  Bilateral rhonchi noted on exam.  He is concerned that he is developing bronchitis.  Will empirically treat with Z-Pak and add Tussionex for cough relief at night as this has worked well previously.  He will return to care if symptoms worsen or fail to improve.

## 2024-02-11 NOTE — Telephone Encounter (Unsigned)
 Copied from CRM [id number]. Topic: Clinical - Medical Advice >> Feb 11, 2024  9:29 AM Jeremy Lopez wrote: Reason for CRM: Pt stated he is having a new CT of his chest at 2:30pm today at [facility], however he recently had CT of head and neck completed on 07/05/2023. Pt stated he has radiation scarring from so many CT scans being completed, and he wants to know if provider Dara Ear was aware he had a stroke in September which led to his CT scans being completed, and would his previous scans cover what she is wanting to see from this new CT scan being scheduled. Please call the patient back today at [phone] and it is ok to leave a vm.

## 2024-02-12 NOTE — Telephone Encounter (Signed)
 Spoke with patient 5/6, patient has rescheduled Chest CT for 5/16 and is aware Chest CT is needed.

## 2024-02-18 ENCOUNTER — Ambulatory Visit: Admitting: Acute Care

## 2024-02-21 ENCOUNTER — Ambulatory Visit (HOSPITAL_COMMUNITY)
Admission: RE | Admit: 2024-02-21 | Discharge: 2024-02-21 | Disposition: A | Discharge status: Home / Self Care | Source: Ambulatory Visit | Attending: Acute Care | Admitting: Acute Care

## 2024-02-21 DIAGNOSIS — C3490 Malignant neoplasm of unspecified part of unspecified bronchus or lung: Secondary | ICD-10-CM | POA: Diagnosis present

## 2024-03-03 ENCOUNTER — Encounter: Payer: Self-pay | Admitting: Acute Care

## 2024-03-03 ENCOUNTER — Telehealth: Admitting: Acute Care

## 2024-03-03 ENCOUNTER — Ambulatory Visit: Admitting: Acute Care

## 2024-03-03 DIAGNOSIS — Z85118 Personal history of other malignant neoplasm of bronchus and lung: Secondary | ICD-10-CM

## 2024-03-03 DIAGNOSIS — Z87891 Personal history of nicotine dependence: Secondary | ICD-10-CM | POA: Diagnosis not present

## 2024-03-03 DIAGNOSIS — J449 Chronic obstructive pulmonary disease, unspecified: Secondary | ICD-10-CM

## 2024-03-03 NOTE — Progress Notes (Deleted)
 History of Present Illness Jeremy Lopez is a 73 y.o. male 73 y.o. male former smoker referred in February 2023 for history of lung cancer by Artemisa Bile, MD . Followed by Dr. Thelda Finney. Will need to be reassigned   Synopsis 73 year old gentleman, history of coronary disease, DES to LAD in 2010, essential hypertension, hyperlipidemia small cell lung cancer right upper lobe 2008. Treatment ended in February 2009. For the past 10 years, and worse since this past September had recurrent bronchitis. Currently not really seeing any one regarding his lung ca. In September of 2022 had another episode of bronchitis treated with ABX. The congestion seems to be the biggest problem. He was treated with 3 separate rounds of abx. He has been using albuterol  and Flonase . Former smoker, heavy smoker, quit at age 46, starting smoking at age 58 years old. Doing better on Trelegy  He had a stroke in September 2024. He is still a bit weak on his right side. He feels his short term memory has not recovered.He is being followed by neurology.    He has not had a CT Chest since 2023. With his history of lung cancer he should be having surveillance imaging. We ordered one after his 12/2023 office visit and he is here today to review the results.    03/03/2024  Test Results: CT Chest 02/2024 Mediastinum/Nodes: Nothing significant.   Lungs/Pleura: No pleural effusion or pneumothorax.   Linear density is present in the medial aspect of the superior segment of the right lower lobe as well as the medial aspect of the right upper lobe in close proximity to the mediastinum. Ectasia of the associated bronchi with areas of associated scarring or favored, possibly sequelae of prior radiation therapy. This area of parenchymal disease is similar when compared to the previous exam. No new nodules are appreciated.  Presumed post treatment changes in the right lung which is not significantly changed from prior and may represent  sequelae of previous radiation treatment. No new nodules are identified.       Latest Ref Rng & Units 07/15/2023    5:56 AM 07/12/2023    7:02 AM 07/06/2023    6:02 AM  CBC  WBC 4.0 - 10.5 K/uL 6.5  8.2  9.4   Hemoglobin 13.0 - 17.0 g/dL 03.4  74.2  59.5   Hematocrit 39.0 - 52.0 % 39.6  40.7  42.7   Platelets 150 - 400 K/uL 356  351  298        Latest Ref Rng & Units 07/15/2023    5:56 AM 07/12/2023    7:02 AM 07/06/2023    6:02 AM  BMP  Glucose 70 - 99 mg/dL 78  94  95   BUN 8 - 23 mg/dL 20  21  18    Creatinine 0.61 - 1.24 mg/dL 6.38  7.56  4.33   Sodium 135 - 145 mmol/L 133  135  134   Potassium 3.5 - 5.1 mmol/L 3.6  4.0  3.6   Chloride 98 - 111 mmol/L 99  100  99   CO2 22 - 32 mmol/L 25  26  25    Calcium  8.9 - 10.3 mg/dL 8.3  8.6  8.5     BNP    Component Value Date/Time   BNP 54.0 06/13/2022 1458    ProBNP    Component Value Date/Time   PROBNP 104 02/08/2021 1318    PFT    Component Value Date/Time   FEV1PRE 2.73 01/23/2022 0952  FEV1POST 2.87 01/23/2022 0952   FVCPRE 3.87 01/23/2022 0952   FVCPOST 3.96 01/23/2022 0952   TLC 6.30 01/23/2022 0952   DLCOUNC 19.52 01/23/2022 0952   PREFEV1FVCRT 70 01/23/2022 0952   PSTFEV1FVCRT 73 01/23/2022 0952    CT CHEST WO CONTRAST Result Date: 02/22/2024 CLINICAL DATA:  Lung nodule greater than 8 mm EXAM: CT CHEST WITHOUT CONTRAST TECHNIQUE: Multidetector CT imaging of the chest was performed following the standard protocol without IV contrast. RADIATION DOSE REDUCTION: This exam was performed according to the departmental dose-optimization program which includes automated exposure control, adjustment of the mA and/or kV according to patient size and/or use of iterative reconstruction technique. COMPARISON:  CT of the chest performed June 13, 2022 FINDINGS: Cardiovascular: Normal size heart. No pericardial effusion. No thoracic aortic aneurysm. Atherosclerotic changes are present in the thoracic aorta and coronary  arteries. Mediastinum/Nodes: Nothing significant. Lungs/Pleura: No pleural effusion or pneumothorax. Linear density is present in the medial aspect of the superior segment of the right lower lobe as well as the medial aspect of the right upper lobe in close proximity to the mediastinum. Ectasia of the associated bronchi with areas of associated scarring or favored, possibly sequelae of prior radiation therapy. This area of parenchymal disease is similar when compared to the previous exam. No new nodules are appreciated. Upper Abdomen: No acute abnormality. Musculoskeletal: Degenerative changes are present in the thoracic spine. Left chest wall port with catheter terminating in the SVC. IMPRESSION: 1. Presumed post treatment changes in the right lung which is not significantly changed from prior and may represent sequelae of previous radiation treatment. No new nodules are identified. Electronically Signed   By: Reagan Camera M.D.   On: 02/22/2024 07:27     Past medical hx Past Medical History:  Diagnosis Date   CAD (coronary artery disease), native coronary artery    DES proximal LAD 07/2009; DES to RCA 04/2020   Chronic heart failure with preserved ejection fraction (HFpEF) (HCC)    Essential hypertension    Habitual alcohol use    Hyperlipidemia    PVC's (premature ventricular contractions)    Small cell lung cancer (HCC)    Right upper lobe 2008; no evidence of disease since treatment ended in 11/2007     Social History   Tobacco Use   Smoking status: Former    Current packs/day: 0.00    Types: Cigarettes    Start date: 10/25/1966    Quit date: 10/25/1998    Years since quitting: 25.3    Passive exposure: Past   Smokeless tobacco: Never  Vaping Use   Vaping status: Never Used  Substance Use Topics   Alcohol use: Yes    Alcohol/week: 14.0 standard drinks of alcohol    Types: 14 Cans of beer per week    Comment: 1 drink per day   Drug use: Not Currently    Types: Marijuana     Mr.Winzer reports that he quit smoking about 25 years ago. His smoking use included cigarettes. He started smoking about 57 years ago. He has been exposed to tobacco smoke. He has never used smokeless tobacco. He reports current alcohol use of about 14.0 standard drinks of alcohol per week. He reports that he does not currently use drugs after having used the following drugs: Marijuana.  Tobacco Cessation: Counseling given: Not Answered   Past surgical hx, Family hx, Social hx all reviewed.  Current Outpatient Medications on File Prior to Visit  Medication Sig   acetaminophen  (TYLENOL )  325 MG tablet Take 1-2 tablets (325-650 mg total) by mouth every 4 (four) hours as needed for mild pain.   azithromycin  (ZITHROMAX  Z-PAK) 250 MG tablet Take 2 tablets (500 mg) PO today, then 1 tablet (250 mg) PO daily x4 days.   Blood Pressure Monitoring (OMRON 3 SERIES BP MONITOR) DEVI As directed   chlorpheniramine-HYDROcodone  (TUSSIONEX) 10-8 MG/5ML Take 5 mLs by mouth every 12 (twelve) hours as needed for cough.   Cholecalciferol  (VITAMIN D ) 50 MCG (2000 UT) tablet Take 2,000 Units by mouth daily.   clopidogrel  (PLAVIX ) 75 MG tablet Take 1 tablet (75 mg total) by mouth daily.   diltiazem  (CARDIZEM  CD) 180 MG 24 hr capsule Take 1 capsule (180 mg total) by mouth daily.   diphenoxylate -atropine  (LOMOTIL ) 2.5-0.025 MG tablet Take 1 tablet by mouth 4 (four) times daily as needed for diarrhea or loose stools.   empagliflozin  (JARDIANCE ) 10 MG TABS tablet Take 1 tablet (10 mg total) by mouth daily.   escitalopram  (LEXAPRO ) 10 MG tablet Take 1 tablet (10 mg total) by mouth daily.   Fluticasone -Umeclidin-Vilant (TRELEGY ELLIPTA ) 100-62.5-25 MCG/ACT AEPB Inhale 1 puff into the lungs daily.   Fluticasone -Umeclidin-Vilant (TRELEGY ELLIPTA ) 100-62.5-25 MCG/ACT AEPB Inhale 1 puff into the lungs daily.   gabapentin  (NEURONTIN ) 300 MG capsule Take 1 capsule (300 mg total) by mouth at bedtime.   irbesartan  (AVAPRO )  300 MG tablet Take 1 tablet (300 mg total) by mouth daily.   l-methylfolate-B6-B12 (METANX) 3-35-2 MG TABS tablet Take 1 tablet by mouth daily.   magnesium  oxide (MAG-OX) 400 MG tablet Take 1.5 tablets (600 mg total) by mouth at bedtime.   metformin  (FORTAMET ) 500 MG (OSM) 24 hr tablet Take 1 tablet (500 mg total) by mouth daily with breakfast.   metoprolol  succinate (TOPROL -XL) 100 MG 24 hr tablet Take 1 tablet (100 mg total) by mouth daily. Take with or immediately following a meal.   nitroGLYCERIN  (NITROSTAT ) 0.4 MG SL tablet Place 1 tablet (0.4 mg total) under the tongue every 5 (five) minutes as needed for chest pain.   prochlorperazine  (COMPAZINE ) 10 MG tablet Take 1 tablet (10 mg total) by mouth every 6 (six) hours as needed for nausea or vomiting.   rosuvastatin  (CRESTOR ) 40 MG tablet TAKE ONE (1) TABLET BY MOUTH EVERY DAY   tamsulosin  (FLOMAX ) 0.4 MG CAPS capsule Take 1 capsule (0.4 mg total) by mouth daily after supper.   traZODone  (DESYREL ) 50 MG tablet Take 1 tablet (50 mg total) by mouth at bedtime.   No current facility-administered medications on file prior to visit.     Allergies  Allergen Reactions   Cephalexin Anaphylaxis    throat swelling Patient said he can take amoxicillin   Amlodipine  Swelling    Leg swelling    Review Of Systems:  Constitutional:   No  weight loss, night sweats,  Fevers, chills, fatigue, or  lassitude.  HEENT:   No headaches,  Difficulty swallowing,  Tooth/dental problems, or  Sore throat,                No sneezing, itching, ear ache, nasal congestion, post nasal drip,   CV:  No chest pain,  Orthopnea, PND, swelling in lower extremities, anasarca, dizziness, palpitations, syncope.   GI  No heartburn, indigestion, abdominal pain, nausea, vomiting, diarrhea, change in bowel habits, loss of appetite, bloody stools.   Resp: No shortness of breath with exertion or at rest.  No excess mucus, no productive cough,  No non-productive cough,  No  coughing up of blood.  No change in color of mucus.  No wheezing.  No chest wall deformity  Skin: no rash or lesions.  GU: no dysuria, change in color of urine, no urgency or frequency.  No flank pain, no hematuria   MS:  No joint pain or swelling.  No decreased range of motion.  No back pain.  Psych:  No change in mood or affect. No depression or anxiety.  No memory loss.   Vital Signs There were no vitals taken for this visit.   Physical Exam:  General- No distress,  A&Ox3 ENT: No sinus tenderness, TM clear, pale nasal mucosa, no oral exudate,no post nasal drip, no LAN Cardiac: S1, S2, regular rate and rhythm, no murmur Chest: No wheeze/ rales/ dullness; no accessory muscle use, no nasal flaring, no sternal retractions Abd.: Soft Non-tender Ext: No clubbing cyanosis, edema Neuro:  normal strength Skin: No rashes, warm and dry Psych: normal mood and behavior   Assessment/Plan  No problem-specific Assessment & Plan notes found for this encounter.    Raejean Bullock, NP 03/03/2024  10:15 AM

## 2024-03-03 NOTE — Patient Instructions (Addendum)
 It is good to see you today. Your CT Chest is stable, which is great news.  We will do a CT Chest in 12 months as surveillance .  You will get a call to get this scheduled closer to the time it is due.  You will follow  up with myself or Dr. Baldwin Levee after to review the results. Continue Trelegy as you have been doing. One puff once daily Rinse mouth after use Call us  if you need us  sooner , or for any unexplained weight loss , or blood in sputum.  Note your daily symptoms > remember "red flags" for COPD:  Increase in cough, increase in sputum production, increase in shortness of breath or activity intolerance. If you notice these symptoms, please call to be seen.   Have a great year.  Please contact office for sooner follow up if symptoms do not improve or worsen or seek emergency care

## 2024-03-03 NOTE — Progress Notes (Signed)
 Virtual Visit via Video Note  I connected with Jeremy Lopez on 03/03/24 at 11:30 AM EDT by a video enabled telemedicine application and verified that I am speaking with the correct person using two identifiers.  Location: Patient:  At home Provider:  [address] [place], [place], [address]    I discussed the limitations of evaluation and management by telemedicine and the availability of in person appointments. The patient expressed understanding and agreed to proceed.  History of Present Illness: Jeremy Lopez is a 73 y.o. male 73 y.o. male former smoker referred in February 2023 for history of lung cancer by Artemisa Bile, MD . Followed by Dr. Thelda Finney. Will need to be reassigned to a new primary pulmonary MD.   Synopsis 73 year old gentleman, history of coronary disease, DES to LAD in 2010, essential hypertension, hyperlipidemia small cell lung cancer right upper lobe 2008. Treatment ended in February 2009. For the past 10 years, and worse since this past September had recurrent bronchitis. Currently not really seeing any one regarding his lung ca. In September of 2022 had another episode of bronchitis treated with ABX. The congestion seems to be the biggest problem. He was treated with 3 separate rounds of abx. He has been using albuterol  and Flonase . Former smoker, heavy smoker, quit at age 10, starting smoking at age 39 years old. Doing better on Trelegy  He had a stroke in September 2024. He is still a bit weak on his right side. He feels his short term memory has not recovered.He is being followed by neurology.    He has not had a CT Chest since 2023. With his history of lung cancer he should be having surveillance imaging. We ordered one after his 12/2023 office visit and he is here today to review the results.   CT Chest done 02/2024 was stable with no new nodules identified.Pt. is asymptomatic. He is a former smoker, and quit 2025, so he does not qualify for lung cancer screening. We  will do annual surveillance scanning.  BP has stabilized 153/80's , which is better. He is waiting on a call from Neurology for follow up appointments. He did see his neurologist  last week, spinal tap done at that time which showed increased pressures in his spinal fluid.  Neurology want to schedule a shunt for hydrocephalus. Family and PCP are in agreement. This has not been scheduled, but is in the planning stages.     Observations/Objective: Test Results: CT Chest 02/2024 Mediastinum/Nodes: Nothing significant.   Lungs/Pleura: No pleural effusion or pneumothorax.   Linear density is present in the medial aspect of the superior segment of the right lower lobe as well as the medial aspect of the right upper lobe in close proximity to the mediastinum. Ectasia of the associated bronchi with areas of associated scarring or favored, possibly sequelae of prior radiation therapy. This area of parenchymal disease is similar when compared to the previous exam. No new nodules are appreciated.  Presumed post treatment changes in the right lung which is not significantly changed from prior and may represent sequelae of previous radiation treatment. No new nodules are identified.   Assessment and Plan: Stable post treatment changes to right lung History of small cell lung cancer 2008 Former smoker quit 2000 No new lung nodules Plan Your CT Chest is stable, which is great news.  We will do a CT Chest in 12 months as surveillance .  You will get a call to get this scheduled  closer to the time it is due.  You will follow  up with myself or Dr. Baldwin Levee after to review the results. Continue Trelegy as you have been doing. One puff once daily Rinse mouth after use Call us  if you need us  sooner , or for any unexplained weight loss , or blood in sputum.  Note your daily symptoms > remember "red flags" for COPD:  Increase in cough, increase in sputum production, increase in shortness of breath or  activity intolerance. If you notice these symptoms, please call to be seen.   Have a great year.  Please contact office for sooner follow up if symptoms do not improve or worsen or seek emergency care  Follow Up Instructions: We will do a CT Chest in 12 months as surveillance .  You will get a call to get this scheduled closer to the time it is due.  You will follow  up with myself or Dr. Baldwin Levee after to review the results. Call us  if you need us  sooner , or for any unexplained weight loss , or blood in sputum   I discussed the assessment and treatment plan with the patient. The patient was provided an opportunity to ask questions and all were answered. The patient agreed with the plan and demonstrated an understanding of the instructions.   The patient was advised to call back or seek an in-person evaluation if the symptoms worsen or if the condition fails to improve as anticipated.  I spent 30 minutes dedicated to the care of this patient on the date of this encounter to include pre-visit review of records, face-to-face time with the patient discussing conditions above, post visit ordering of testing, clinical documentation with the electronic health record, making appropriate referrals as documented, and communicating necessary information to the patient's healthcare team.   Raejean Bullock, NP

## 2024-03-10 ENCOUNTER — Telehealth: Payer: Self-pay

## 2024-03-10 ENCOUNTER — Other Ambulatory Visit: Payer: Self-pay | Admitting: Neurosurgery

## 2024-03-10 NOTE — Telephone Encounter (Signed)
 PreOp Surgery Clearance   Noted Copied Sleeved Original in PCP box Copy front desk folder

## 2024-03-11 ENCOUNTER — Ambulatory Visit (INDEPENDENT_AMBULATORY_CARE_PROVIDER_SITE_OTHER)

## 2024-03-11 VITALS — BP 171/81 | HR 61 | Ht 70.0 in | Wt 199.0 lb

## 2024-03-11 DIAGNOSIS — Z01818 Encounter for other preprocedural examination: Secondary | ICD-10-CM | POA: Diagnosis not present

## 2024-03-11 DIAGNOSIS — J410 Simple chronic bronchitis: Secondary | ICD-10-CM

## 2024-03-11 DIAGNOSIS — G912 (Idiopathic) normal pressure hydrocephalus: Secondary | ICD-10-CM | POA: Diagnosis not present

## 2024-03-11 DIAGNOSIS — R269 Unspecified abnormalities of gait and mobility: Secondary | ICD-10-CM | POA: Insufficient documentation

## 2024-03-11 MED ORDER — HYDROCOD POLI-CHLORPHE POLI ER 10-8 MG/5ML PO SUER: 5.0000 mL | Freq: Two times a day (BID) | ORAL | 0 refills | Status: DC | PRN

## 2024-03-11 NOTE — Assessment & Plan Note (Signed)
 Cleared for upcoming shunt placement.

## 2024-03-11 NOTE — Assessment & Plan Note (Signed)
 Agreed to refill Tussionex for as needed use

## 2024-03-11 NOTE — Telephone Encounter (Signed)
 Pt has a appt at 1pm today with Rice Chamorro.

## 2024-03-11 NOTE — Progress Notes (Signed)
 Established Patient Office Visit  Subjective   Patient ID: Jeremy Lopez, male    DOB: [birth year]  Age: 73 y.o. MRN: [id number]  Chief Complaint  Patient presents with   Medical Management of Chronic Issues    Pt here for medical clearence    HPI  Patient is here today for preop clearance for upcoming shunt placement by Dr. Dr. Augusto Blonder with [facility] neurosurgery and spine in [place].  Surgery is scheduled for 03/20/2024.  This is for treatment of his NPH.   Patient denies any other concerns at this time.   Patient Active Problem List   Diagnosis Date Noted   NPH (normal pressure hydrocephalus) (HCC) 03/11/2024   Abnormality of gait 03/11/2024   Diarrhea 11/01/2023   Acute bacterial bronchitis 09/24/2023   Acute CVA (cerebrovascular accident) (HCC) 07/13/2023   Dizziness 07/12/2023   Cerebellar stroke, acute (HCC) 07/07/2023   CVA (cerebral vascular accident) (HCC) 07/05/2023   Headache 07/05/2023   Prediabetes 02/01/2023   CKD (chronic kidney disease) stage 2, GFR 60-89 ml/min 02/01/2023   Nausea 02/01/2023   Colon cancer screening 02/01/2023   CHF (congestive heart failure) (HCC) 01/23/2023   Numbness and tingling of both lower extremities 01/23/2023   Encounter for general adult medical examination with abnormal findings 01/23/2023   Allergic rhinitis 01/23/2022   Asthmatic bronchitis, mild persistent, uncomplicated 01/23/2022   Upper airway cough syndrome 01/23/2022   Simple chronic bronchitis (HCC) 12/28/2021   Seasonal allergies 12/28/2021   DOE (dyspnea on exertion)    Nosebleed 10/25/2014   Lung cancer (HCC) 02/05/2010   CAD (coronary artery disease), native coronary artery 02/05/2010   Mixed hyperlipidemia 08/16/2009   Essential hypertension 07/08/2009   Past Medical History:  Diagnosis Date   CAD (coronary artery disease), native coronary artery    DES proximal LAD 07/2009; DES to RCA 04/2020   Chronic heart failure with preserved ejection  fraction (HFpEF) (HCC)    Essential hypertension    Habitual alcohol use    Hyperlipidemia    PVC's (premature ventricular contractions)    Small cell lung cancer (HCC)    Right upper lobe 2008; no evidence of disease since treatment ended in 11/2007      ROS    Objective:     BP (!) 171/81   Pulse 61   Ht 5\' 10"  (1.778 m)   Wt 199 lb (90.3 kg)   SpO2 94%   BMI 28.55 kg/m  BP Readings from Last 3 Encounters:  03/11/24 (!) 171/81  01/27/24 (!) 193/100  01/02/24 (!) 143/65   Wt Readings from Last 3 Encounters:  03/11/24 199 lb (90.3 kg)  01/02/24 199 lb 9.6 oz (90.5 kg)  12/25/23 200 lb 9.6 oz (91 kg)     Physical Exam Vitals and nursing note reviewed.  Constitutional:      Appearance: Normal appearance.  Eyes:     Extraocular Movements: Extraocular movements intact.     Pupils: Pupils are equal, round, and reactive to light.  Cardiovascular:     Rate and Rhythm: Normal rate and regular rhythm.  Pulmonary:     Effort: Pulmonary effort is normal.     Breath sounds: Rhonchi present.  Musculoskeletal:     Cervical back: Normal range of motion and neck supple.  Neurological:     Mental Status: He is alert and oriented to person, place, and time.     Gait: Gait abnormal.  Psychiatric:        Mood and Affect:  Mood normal.        Thought Content: Thought content normal.      No results found for any visits on 03/11/24.  Last CBC Lab Results  Component Value Date   WBC 6.5 07/15/2023   HGB 13.2 07/15/2023   HCT 39.6 07/15/2023   MCV 92.7 07/15/2023   MCH 30.9 07/15/2023   RDW 14.2 07/15/2023   PLT 356 07/15/2023   Last metabolic panel Lab Results  Component Value Date   GLUCOSE 78 07/15/2023   NA 133 (L) 07/15/2023   K 3.6 07/15/2023   CL 99 07/15/2023   CO2 25 07/15/2023   BUN 20 07/15/2023   CREATININE 1.19 07/15/2023   GFRNONAA >60 07/15/2023   CALCIUM  8.3 (L) 07/15/2023   PROT 6.0 (L) 07/06/2023   ALBUMIN 3.3 (L) 07/06/2023   LABGLOB 2.5  01/18/2023   AGRATIO 1.8 01/18/2023   BILITOT 0.6 07/06/2023   ALKPHOS 61 07/06/2023   AST 11 (L) 07/06/2023   ALT 13 07/06/2023   ANIONGAP 9 07/15/2023   Last hemoglobin A1c Lab Results  Component Value Date   HGBA1C 5.7 (H) 07/05/2023   Last thyroid  functions Lab Results  Component Value Date   TSH 2.590 01/18/2023      The ASCVD Risk score (Arnett DK, et al., 2019) failed to calculate for the following reasons:   Risk score cannot be calculated because patient has a medical history suggesting prior/existing ASCVD    Assessment & Plan:   Problem List Items Addressed This Visit       Respiratory   Simple chronic bronchitis (HCC)   Agreed to refill Tussionex for as needed use      Relevant Medications   chlorpheniramine-HYDROcodone  (TUSSIONEX) 10-8 MG/5ML     Nervous and Auditory   NPH (normal pressure hydrocephalus) (HCC) (Chronic)   Cleared for upcoming shunt placement.      Other Visit Diagnoses       Preoperative clearance    -  Primary   Patient is cleared for shunt placement on 03/20/2024.  Patient is aware to stop his Plavix  on 03/13/2024.  Clearance forms filled out and we will fax over today.       No follow-ups on file.    Alison Irvine, FNP

## 2024-03-11 NOTE — Telephone Encounter (Signed)
In provider's box to sign.

## 2024-03-13 ENCOUNTER — Telehealth: Payer: Self-pay

## 2024-03-13 NOTE — Telephone Encounter (Signed)
 Copied from CRM [id number]. Topic: Clinical - Medical Advice >> Mar 13, 2024  9:28 AM Antwanette L wrote: Reason for CRM: Patient is having a flare up due to his bronchitis. The patient wants to know if proiver Alison Irvine) would prescribe a z-pak or dicyclomine. Patient can be contacted by phone at [phone].  Pharmacy Details  [facility] [address] [place] [address] Phone: [phone] Fax: [phone]

## 2024-03-13 NOTE — Telephone Encounter (Signed)
 No symptoms other than his congestion is worse than when he initially came in, Pt reports no fever

## 2024-03-16 ENCOUNTER — Other Ambulatory Visit: Payer: Self-pay

## 2024-03-16 DIAGNOSIS — J4521 Mild intermittent asthma with (acute) exacerbation: Secondary | ICD-10-CM

## 2024-03-16 MED ORDER — AZITHROMYCIN 250 MG PO TABS: ORAL_TABLET | ORAL | 0 refills | Status: DC

## 2024-03-16 NOTE — Pre-Procedure Instructions (Signed)
 Surgical Instructions   Your procedure is scheduled on March 20, 2024. Report to [facility] Main Entrance "A" at 5:30 A.M., then check in with the Admitting office. Any questions or running late day of surgery: call [phone]  Questions prior to your surgery date: call [phone], Monday-Friday, 8am-4pm. If you experience any cold or flu symptoms such as cough, fever, chills, shortness of breath, etc. between now and your scheduled surgery, please notify us  at the above number.     Remember:  Do not eat or drink after midnight the night before your surgery   Take these medicines the morning of surgery with A SIP OF WATER: azithromycin  (ZITHROMAX  Z-PAK)  diltiazem  (CARDIZEM  CD)  escitalopram  (LEXAPRO )  Fluticasone -Umeclidin-Vilant (TRELEGY ELLIPTA ) inhaler metoprolol  succinate (TOPROL -XL)    May take these medicines IF NEEDED: acetaminophen  (TYLENOL )  nitroGLYCERIN  (NITROSTAT ) - if dose taken prior to surgery, please call either of the above phone numbers prochlorperazine  (COMPAZINE )  rosuvastatin  (CRESTOR )    STOP taking your clopidogrel  (PLAVIX ) one week prior to surgery. Your last dose will be June 5th.   One week prior to surgery, STOP taking any Aspirin  (unless otherwise instructed by your surgeon) Aleve, Naproxen, Ibuprofen, Motrin, Advil, Goody's, BC's, all herbal medications, fish oil, and non-prescription vitamins.   WHAT DO I DO ABOUT MY DIABETES MEDICATION?   Do not take metformin  (FORTAMET ) the morning of surgery.  STOP taking your empagliflozin  (JARDIANCE ) three days prior to surgery. Your last dose will be June 9th.       HOW TO MANAGE YOUR DIABETES BEFORE AND AFTER SURGERY  Why is it important to control my blood sugar before and after surgery? Improving blood sugar levels before and after surgery helps healing and can limit problems. A way of improving blood sugar control is eating a healthy diet by:  Eating less sugar and carbohydrates   Increasing activity/exercise  Talking with your doctor about reaching your blood sugar goals High blood sugars (greater than 180 mg/dL) can raise your risk of infections and slow your recovery, so you will need to focus on controlling your diabetes during the weeks before surgery. Make sure that the doctor who takes care of your diabetes knows about your planned surgery including the date and location.  How do I manage my blood sugar before surgery? Check your blood sugar at least 4 times a day, starting 2 days before surgery, to make sure that the level is not too high or low.  Check your blood sugar the morning of your surgery when you wake up and every 2 hours until you get to the Short Stay unit.  If your blood sugar is less than 70 mg/dL, you will need to treat for low blood sugar: Do not take insulin. Treat a low blood sugar (less than 70 mg/dL) with  cup of clear juice (cranberry or apple), 4 glucose tablets, OR glucose gel. Recheck blood sugar in 15 minutes after treatment (to make sure it is greater than 70 mg/dL). If your blood sugar is not greater than 70 mg/dL on recheck, call [phone] for further instructions. Report your blood sugar to the short stay nurse when you get to Short Stay.  If you are admitted to the hospital after surgery: Your blood sugar will be checked by the staff and you will probably be given insulin after surgery (instead of oral diabetes medicines) to make sure you have good blood sugar levels. The goal for blood sugar control after surgery is 80-180 mg/dL.  Do NOT Smoke (Tobacco/Vaping) for 24 hours prior to your procedure.  If you use a CPAP at night, you may bring your mask/headgear for your overnight stay.   You will be asked to remove any contacts, glasses, piercing's, hearing aid's, dentures/partials prior to surgery. Please bring cases for these items if needed.    Patients discharged the day of surgery will not be allowed  to drive home, and someone needs to stay with them for 24 hours.  SURGICAL WAITING ROOM VISITATION Patients may have no more than 2 support people in the waiting area - these visitors may rotate.   Pre-op nurse will coordinate an appropriate time for 1 ADULT support person, who may not rotate, to accompany patient in pre-op.  Children under the age of 26 must have an adult with them who is not the patient and must remain in the main waiting area with an adult.  If the patient needs to stay at the hospital during part of their recovery, the visitor guidelines for inpatient rooms apply.  Please refer to the [facility] website for the visitor guidelines for any additional information.   If you received a COVID test during your pre-op visit  it is requested that you wear a mask when out in public, stay away from anyone that may not be feeling well and notify your surgeon if you develop symptoms. If you have been in contact with anyone that has tested positive in the last 10 days please notify you surgeon.      Pre-operative CHG Bathing Instructions   You can play a key role in reducing the risk of infection after surgery. Your skin needs to be as free of germs as possible. You can reduce the number of germs on your skin by washing with CHG (chlorhexidine gluconate) soap before surgery. CHG is an antiseptic soap that kills germs and continues to kill germs even after washing.   DO NOT use if you have an allergy to chlorhexidine/CHG or antibacterial soaps. If your skin becomes reddened or irritated, stop using the CHG and notify one of our RNs at [phone].              TAKE A SHOWER THE NIGHT BEFORE SURGERY AND THE DAY OF SURGERY    Please keep in mind the following:  DO NOT shave, including legs and underarms, 48 hours prior to surgery.   You may shave your face before/day of surgery.  Place clean sheets on your bed the night before surgery Use a clean washcloth (not used since being  washed) for each shower. DO NOT sleep with pet's night before surgery.  CHG Shower Instructions:  Wash your face and private area with normal soap. If you choose to wash your hair, wash first with your normal shampoo.  After you use shampoo/soap, rinse your hair and body thoroughly to remove shampoo/soap residue.  Turn the water OFF and apply half the bottle of CHG soap to a CLEAN washcloth.  Apply CHG soap ONLY FROM YOUR NECK DOWN TO YOUR TOES (washing for 3-5 minutes)  DO NOT use CHG soap on face, private areas, open wounds, or sores.  Pay special attention to the area where your surgery is being performed.  If you are having back surgery, having someone wash your back for you may be helpful. Wait 2 minutes after CHG soap is applied, then you may rinse off the CHG soap.  Pat dry with a clean towel  Put on clean pajamas  Additional instructions for the day of surgery: DO NOT APPLY any lotions, deodorants, cologne, or perfumes.   Do not wear jewelry or makeup Do not wear nail polish, gel polish, artificial nails, or any other type of covering on natural nails (fingers and toes) Do not bring valuables to the hospital. [facility] is not responsible for valuables/personal belongings. Put on clean/comfortable clothes.  Please brush your teeth.  Ask your nurse before applying any prescription medications to the skin.

## 2024-03-17 ENCOUNTER — Encounter (HOSPITAL_COMMUNITY)
Admission: RE | Admit: 2024-03-17 | Discharge: 2024-03-17 | Disposition: A | Discharge status: Home / Self Care | Source: Ambulatory Visit | Attending: Neurosurgery

## 2024-03-17 ENCOUNTER — Encounter (HOSPITAL_COMMUNITY): Payer: Self-pay

## 2024-03-17 ENCOUNTER — Other Ambulatory Visit: Payer: Self-pay

## 2024-03-17 VITALS — BP 151/67 | HR 58 | Temp 97.9°F | Resp 19 | Ht 70.0 in | Wt 197.7 lb

## 2024-03-17 DIAGNOSIS — E119 Type 2 diabetes mellitus without complications: Secondary | ICD-10-CM | POA: Insufficient documentation

## 2024-03-17 DIAGNOSIS — I358 Other nonrheumatic aortic valve disorders: Secondary | ICD-10-CM | POA: Insufficient documentation

## 2024-03-17 DIAGNOSIS — Z01812 Encounter for preprocedural laboratory examination: Secondary | ICD-10-CM | POA: Insufficient documentation

## 2024-03-17 DIAGNOSIS — Z8673 Personal history of transient ischemic attack (TIA), and cerebral infarction without residual deficits: Secondary | ICD-10-CM | POA: Insufficient documentation

## 2024-03-17 DIAGNOSIS — I5032 Chronic diastolic (congestive) heart failure: Secondary | ICD-10-CM | POA: Insufficient documentation

## 2024-03-17 DIAGNOSIS — J42 Unspecified chronic bronchitis: Secondary | ICD-10-CM | POA: Insufficient documentation

## 2024-03-17 DIAGNOSIS — Z85118 Personal history of other malignant neoplasm of bronchus and lung: Secondary | ICD-10-CM | POA: Insufficient documentation

## 2024-03-17 DIAGNOSIS — Z955 Presence of coronary angioplasty implant and graft: Secondary | ICD-10-CM | POA: Insufficient documentation

## 2024-03-17 DIAGNOSIS — I10 Essential (primary) hypertension: Secondary | ICD-10-CM

## 2024-03-17 DIAGNOSIS — I110 Hypertensive heart disease with heart failure: Secondary | ICD-10-CM | POA: Insufficient documentation

## 2024-03-17 DIAGNOSIS — Z923 Personal history of irradiation: Secondary | ICD-10-CM | POA: Insufficient documentation

## 2024-03-17 DIAGNOSIS — Z01818 Encounter for other preprocedural examination: Secondary | ICD-10-CM

## 2024-03-17 DIAGNOSIS — Z87891 Personal history of nicotine dependence: Secondary | ICD-10-CM | POA: Insufficient documentation

## 2024-03-17 DIAGNOSIS — I2510 Atherosclerotic heart disease of native coronary artery without angina pectoris: Secondary | ICD-10-CM | POA: Insufficient documentation

## 2024-03-17 HISTORY — DX: Personal history of urinary calculi: Z87.442

## 2024-03-17 HISTORY — DX: Migraine with aura, not intractable, without status migrainosus: G43.109

## 2024-03-17 HISTORY — DX: Unspecified chronic bronchitis: J42

## 2024-03-17 LAB — CBC
HCT: 45 % (ref 39.0–52.0)
Hemoglobin: 14.6 g/dL (ref 13.0–17.0)
MCH: 29.5 pg (ref 26.0–34.0)
MCHC: 32.4 g/dL (ref 30.0–36.0)
MCV: 90.9 fL (ref 80.0–100.0)
Platelets: 443 10*3/uL — ABNORMAL HIGH (ref 150–400)
RBC: 4.95 MIL/uL (ref 4.22–5.81)
RDW: 14.6 % (ref 11.5–15.5)
WBC: 10.5 10*3/uL (ref 4.0–10.5)
nRBC: 0 % (ref 0.0–0.2)

## 2024-03-17 LAB — BASIC METABOLIC PANEL WITH GFR
Anion gap: 8 (ref 5–15)
BUN: 17 mg/dL (ref 8–23)
CO2: 26 mmol/L (ref 22–32)
Calcium: 9.4 mg/dL (ref 8.9–10.3)
Chloride: 102 mmol/L (ref 98–111)
Creatinine, Ser: 1.12 mg/dL (ref 0.61–1.24)
GFR, Estimated: >60 mL/min (ref >60)
Glucose, Bld: 99 mg/dL (ref 70–99)
Potassium: 4.3 mmol/L (ref 3.5–5.1)
Sodium: 136 mmol/L (ref 135–145)

## 2024-03-17 NOTE — Pre-Procedure Instructions (Signed)
 Surgical Instructions   Your procedure is scheduled on March 20, 2024. Report to [facility] Main Entrance "A" at 5:30 A.M., then check in with the Admitting office. Any questions or running late day of surgery: call [phone]  Questions prior to your surgery date: call [phone], Monday-Friday, 8am-4pm. If you experience any cold or flu symptoms such as cough, fever, chills, shortness of breath, etc. between now and your scheduled surgery, please notify us  at the above number.     Remember:  Do not eat or drink after midnight the night before your surgery   Take these medicines the morning of surgery with A SIP OF WATER: azithromycin  (ZITHROMAX  Z-PAK)  diltiazem  (CARDIZEM  CD)  escitalopram  (LEXAPRO )  Fluticasone -Umeclidin-Vilant (TRELEGY ELLIPTA ) inhaler metoprolol  succinate (TOPROL -XL)    May take these medicines IF NEEDED: acetaminophen  (TYLENOL )  nitroGLYCERIN  (NITROSTAT ) - if dose taken prior to surgery, please call either of the above phone numbers prochlorperazine  (COMPAZINE )  rosuvastatin  (CRESTOR )    STOP taking your clopidogrel  (PLAVIX ) one week prior to surgery. Your last dose will be June 5th.   One week prior to surgery, STOP taking any Aspirin  (unless otherwise instructed by your surgeon) Aleve, Naproxen, Ibuprofen, Motrin, Advil, Goody's, BC's, all herbal medications, fish oil, and non-prescription vitamins.                      Do NOT Smoke (Tobacco/Vaping) for 24 hours prior to your procedure.  If you use a CPAP at night, you may bring your mask/headgear for your overnight stay.   You will be asked to remove any contacts, glasses, piercing's, hearing aid's, dentures/partials prior to surgery. Please bring cases for these items if needed.    Patients discharged the day of surgery will not be allowed to drive home, and someone needs to stay with them for 24 hours.  SURGICAL WAITING ROOM VISITATION Patients may have no more than 2 support people in the  waiting area - these visitors may rotate.   Pre-op nurse will coordinate an appropriate time for 1 ADULT support person, who may not rotate, to accompany patient in pre-op.  Children under the age of 92 must have an adult with them who is not the patient and must remain in the main waiting area with an adult.  If the patient needs to stay at the hospital during part of their recovery, the visitor guidelines for inpatient rooms apply.  Please refer to the [facility] website for the visitor guidelines for any additional information.   If you received a COVID test during your pre-op visit  it is requested that you wear a mask when out in public, stay away from anyone that may not be feeling well and notify your surgeon if you develop symptoms. If you have been in contact with anyone that has tested positive in the last 10 days please notify you surgeon.      Pre-operative CHG Bathing Instructions   You can play a key role in reducing the risk of infection after surgery. Your skin needs to be as free of germs as possible. You can reduce the number of germs on your skin by washing with CHG (chlorhexidine gluconate) soap before surgery. CHG is an antiseptic soap that kills germs and continues to kill germs even after washing.   DO NOT use if you have an allergy to chlorhexidine/CHG or antibacterial soaps. If your skin becomes reddened or irritated, stop using the CHG and notify one of our RNs at [phone].  TAKE A SHOWER THE NIGHT BEFORE SURGERY AND THE DAY OF SURGERY    Please keep in mind the following:  DO NOT shave, including legs and underarms, 48 hours prior to surgery.   You may shave your face before/day of surgery.  Place clean sheets on your bed the night before surgery Use a clean washcloth (not used since being washed) for each shower. DO NOT sleep with pet's night before surgery.  CHG Shower Instructions:  Wash your face and private area with normal soap. If you  choose to wash your hair, wash first with your normal shampoo.  After you use shampoo/soap, rinse your hair and body thoroughly to remove shampoo/soap residue.  Turn the water OFF and apply half the bottle of CHG soap to a CLEAN washcloth.  Apply CHG soap ONLY FROM YOUR NECK DOWN TO YOUR TOES (washing for 3-5 minutes)  DO NOT use CHG soap on face, private areas, open wounds, or sores.  Pay special attention to the area where your surgery is being performed.  If you are having back surgery, having someone wash your back for you may be helpful. Wait 2 minutes after CHG soap is applied, then you may rinse off the CHG soap.  Pat dry with a clean towel  Put on clean pajamas    Additional instructions for the day of surgery: DO NOT APPLY any lotions, deodorants, cologne, or perfumes.   Do not wear jewelry or makeup Do not wear nail polish, gel polish, artificial nails, or any other type of covering on natural nails (fingers and toes) Do not bring valuables to the hospital. [facility] is not responsible for valuables/personal belongings. Put on clean/comfortable clothes.  Please brush your teeth.  Ask your nurse before applying any prescription medications to the skin.

## 2024-03-17 NOTE — Progress Notes (Signed)
   03/17/24 1333  OBSTRUCTIVE SLEEP APNEA  Have you ever been diagnosed with sleep apnea through a sleep study? No  Do you snore loudly (loud enough to be heard through closed doors)?  1  Do you often feel tired, fatigued, or sleepy during the daytime (such as falling asleep during driving or talking to someone)? 0  Has anyone observed you stop breathing during your sleep? 0  Do you have, or are you being treated for high blood pressure? 1  BMI more than 35 kg/m2? 0  Age > 50 (1-yes) 1  Neck circumference greater than:Male 16 inches or larger, Male 17inches or larger? 1  Male Gender (Yes=1) 1  Obstructive Sleep Apnea Score 5  Score 5 or greater  Results sent to PCP

## 2024-03-17 NOTE — Progress Notes (Addendum)
 PCP - Alison Irvine, FNP Cardiologist - Dr. Ola Berger Neurologist- Dr. Gwendloyn Lemming  PPM/ICD - denies   Chest x-ray - 07/18/23 EKG - 07/05/23 Stress Test - 04/14/20 ECHO - 07/07/23 Cardiac Cath - 07/03/22  Sleep Study - denies   DM- denies  Last dose of GLP1 agonist-  n/a   Blood Thinner Instructions: Hold Plavix  1 week. Last dose 6/6. Aspirin  Instructions: n/a  ERAS Protcol - no, NPO   COVID TEST- n/a   Anesthesia review: yes, cardiac hx. Stroke hx. Pt was taking Metformin  and Jardiance . I asked pt about the metformin  and if he was diabetic. He informed me that he is not diabetic and has never taken the metformin  and has stopped taking the jardiance . Pt also c/o right leg weakness when he and his wife were out yesterday that lasted about an hour. Royston Cornea, [place], notified. Pt was encouraged to reach out to Dr. Salli Crawley if this was to recur. Pt said he also has chronic bronchitis and was prescribed a z-pack because he thought he was having a "flare up" but ended up feeling better and never picked it up. He's got a productive sounding cough but he said that's something that's constant for him.  Patient denies shortness of breath, fever, and chest pain at PAT appointment   All instructions explained to the patient, with a verbal understanding of the material. Patient agrees to go over the instructions while at home for a better understanding. Patient also instructed to self quarantine after being tested for COVID-19. The opportunity to ask questions was provided.

## 2024-03-18 NOTE — Anesthesia Preprocedure Evaluation (Addendum)
 Anesthesia Evaluation  Patient identified by MRN, date of birth, ID band Patient awake    Reviewed: Allergy & Precautions, NPO status , Patient's Chart, lab work & pertinent test results, reviewed documented beta blocker date and time   History of Anesthesia Complications Negative for: history of anesthetic complications  Airway Mallampati: II  TM Distance: >3 FB Neck ROM: Full    Dental  (+) Dental Advisory Given   Pulmonary asthma , former smoker  SCLC RUL   Pulmonary exam normal        Cardiovascular hypertension, Pt. on home beta blockers and Pt. on medications + CAD and + Cardiac Stents  Normal cardiovascular exam   '24 TTE - EF 60 to 65%. There is mild left ventricular hypertrophy. Grade I diastolic dysfunction (impaired relaxation).      Neuro/Psych  Headaches  NPH  CVA, No Residual Symptoms  negative psych ROS   GI/Hepatic negative GI ROS,,,(+)     substance abuse  alcohol use  Endo/Other  negative endocrine ROS    Renal/GU negative Renal ROS     Musculoskeletal negative musculoskeletal ROS (+)    Abdominal   Peds  Hematology  On plavix     Anesthesia Other Findings   Reproductive/Obstetrics                             Anesthesia Physical Anesthesia Plan  ASA: 3  Anesthesia Plan: General   Post-op Pain Management: Tylenol  PO (pre-op)*   Induction: Intravenous  PONV Risk Score and Plan: 2 and Treatment may vary due to age or medical condition, Ondansetron  and Dexamethasone  Airway Management Planned: Oral ETT  Additional Equipment: None  Intra-op Plan:   Post-operative Plan: Extubation in OR  Informed Consent: I have reviewed the patients History and Physical, chart, labs and discussed the procedure including the risks, benefits and alternatives for the proposed anesthesia with the patient or authorized representative who has indicated his/her understanding  and acceptance.     Dental advisory given  Plan Discussed with: CRNA and Anesthesiologist  Anesthesia Plan Comments: (PAT note by Rudy Costain, PA-C:)        Anesthesia Quick Evaluation

## 2024-03-18 NOTE — Progress Notes (Addendum)
 Anesthesia Chart Review:  73 year old male follows with cardiology for history of HTN, HLD, HFpEF, DES to proximal LAD in 2010 and mid RCA in 2011, LE edema, PVCs. Echo 9/23 revealed EF 60 to 65%, grade 1 DD. Cardiac catheterization at that time revealed nonobstructive CAD with patent stents in LAD and RCA, mildly elevated LVEDP.  Last seen by Lasalle Pointer, NP on 11/08/2023.  Noted to be stable from cardiac standpoint, euvolemic and well compensated on exam.  No changes made to management, 61-month follow-up recommended.  Follows with pulmonology for history of former smoker with associated COPD/chronic bronchitis maintained on Trelegy, small cell lung cancer right upper lobe 2008 treated with chemo and radiation.  CT chest 02/21/2024 showed stable sequela of previous radiation treatment, no new nodules identified.  Follows with neurology for history of gait and balance difficulty.  Symptoms started around 2016 when he noticed his right leg was dragging.  Symptoms progressively worsened over time.  He now has a shuffling gait, leaning forward posture, sometimes loses balance.  He had acute onset of vertigo and gait difficulty in September 2024 and was admitted for stroke workup and found to have right cerebellar infarct.  He was managed medically, treated with aspirin  and Plavix , now on Plavix  alone.  Last seen by neurologist Dr. Salli Crawley 11/19/2023.  He was recommended at that time to undergo large-volume lumbar puncture for evaluation of NPH which he did subsequently have with improvement in symptoms.  He was referred to neurosurgery for VP shunt evaluation.  Seen by PCP Alison Irvine, FNP on 03/11/2024 for preop eval.  Per note, Patient is cleared for shunt placement on 03/20/2024. Patient is aware to stop his Plavix  on 03/13/2024.  Patient reports last dose Plavix  03/13/2024.  Other pertinent history includes CKD 2 prior EtOH abuse (states none since stroke 06/2023), concern for OSA STOP-BANG score  5.  Preop labs reviewed, unremarkable.  EKG 07/05/2023: Sinus rhythm.  Rate 74. Borderline left axis deviation. Abnormal R-wave progression, early transition  TTE 07/07/2023: 1. Left ventricular ejection fraction, by estimation, is 60 to 65%. The  left ventricle has normal function. The left ventricle has no regional  wall motion abnormalities. There is mild left ventricular hypertrophy.  Left ventricular diastolic parameters  are consistent with Grade I diastolic dysfunction (impaired relaxation).   2. Right ventricular systolic function is normal. The right ventricular  size is normal. Tricuspid regurgitation signal is inadequate for assessing  PA pressure.   3. The mitral valve is normal in structure. No evidence of mitral valve  regurgitation.   4. The aortic valve is tricuspid. Aortic valve regurgitation is not  visualized. Aortic valve sclerosis is present, with no evidence of aortic  valve stenosis.   5. The inferior vena cava is normal in size with greater than 50%  respiratory variability, suggesting right atrial pressure of 3 mmHg.   Cath 07/03/2022:   Mid RCA to Dist RCA lesion is 40% stenosed.   Mid RCA lesion is 30% stenosed.   Non-stenotic Prox LAD lesion was previously treated.   LV end diastolic pressure is mildly elevated.   Nonobstructive CAD. Prior stents in the LAD and RCA are patent Mildly elevated LVEDP 17 mm Hg   Plan: medical management.     Edilia Gordon [facility] Short Stay Center/Anesthesiology Phone [phone] 03/18/2024 11:41 AM

## 2024-03-19 NOTE — H&P (Signed)
 Chief Complaint   NPH  History of Present Illness  Jeremy Lopez is a 73 year old man seen for initial consultation at the request of Dr. Salli Crawley.  He is referred for evaluation of normal-pressure hydrocephalus and the potential for ventriculoperitoneal shunting.  Briefly, the patient noted the onset of symptoms primarily involving gait instability which started at least a few years ago and has slowly been progressing.  He says he feels like his balance is off, and his feet are sticking to the ground while he walks.  In addition, the patient has noted some slow progressive worsening and primarily short-term memory.  He does report some urinary incontinence as well.  Patient recently was diagnosed with a small stroke back in September 2024. Since then, he has seen Dr. Salli Crawley and was referred for a high-volume lumbar puncture.  He states that he and his family noted a drastic improvement in his gait instability for several days after the lumbar puncture before symptoms returned.  He does not recall having significant improvement in his memory or incontinence issues after the LP.  Of note, the patient reports a history of hypertension.  He has a history of coronary artery disease status post stent angioplasty x2, last one two years ago.  He is currently maintained on aspirin  and Plavix .  He does have a history of stroke as above.  He has a history of lung cancer more than 15 years ago which is in remission.  He does not report any lung, liver or kidney disease.  He quit smoking decades ago.  Past Medical History   Past Medical History:  Diagnosis Date   CAD (coronary artery disease), native coronary artery    DES proximal LAD 07/2009; DES to RCA 04/2020   Chronic bronchitis (HCC)    Chronic heart failure with preserved ejection fraction (HFpEF) (HCC)    Essential hypertension    Habitual alcohol use    pt has not drank alcohol since stroke in 06/2023   History of kidney stones    Hyperlipidemia     Ocular migraine    PVC's (premature ventricular contractions)    Small cell lung cancer (HCC)    Right upper lobe 2008; no evidence of disease since treatment ended in 11/2007   Stroke (HCC) 07/05/2023    Past Surgical History   Past Surgical History:  Procedure Laterality Date   CORONARY ATHERECTOMY N/A 04/26/2020   Procedure: CORONARY ATHERECTOMY;  Surgeon: Lucendia Rusk, MD;  Location: [facility] INVASIVE CV LAB;  Service: Cardiovascular;  Laterality: N/A;   CORONARY STENT INTERVENTION N/A 04/26/2020   Procedure: CORONARY STENT INTERVENTION;  Surgeon: Lucendia Rusk, MD;  Location: [facility] INVASIVE CV LAB;  Service: Cardiovascular;  Laterality: N/A;   CORONARY ULTRASOUND/IVUS N/A 04/26/2020   Procedure: Intravascular Ultrasound/IVUS;  Surgeon: Lucendia Rusk, MD;  Location: [facility] INVASIVE CV LAB;  Service: Cardiovascular;  Laterality: N/A;   Insertion of left subclavian Port-A-Cath  2008   LEFT HEART CATH AND CORONARY ANGIOGRAPHY N/A 07/03/2022   Procedure: LEFT HEART CATH AND CORONARY ANGIOGRAPHY;  Surgeon: Swaziland, Peter M, MD;  Location: [facility] INVASIVE CV LAB;  Service: Cardiovascular;  Laterality: N/A;   RIGHT/LEFT HEART CATH AND CORONARY ANGIOGRAPHY N/A 04/26/2020   Procedure: RIGHT/LEFT HEART CATH AND CORONARY ANGIOGRAPHY;  Surgeon: Lucendia Rusk, MD;  Location: [facility] INVASIVE CV LAB;  Service: Cardiovascular;  Laterality: N/A;   TEMPORARY PACEMAKER N/A 04/26/2020   Procedure: TEMPORARY PACEMAKER;  Surgeon: Lucendia Rusk, MD;  Location: [facility] INVASIVE CV LAB;  Service: Cardiovascular;  Laterality: N/A;   Video bronchoscopy and video mediastinoscopy  2008    Social History   Social History   Tobacco Use   Smoking status: Former    Current packs/day: 0.00    Types: Cigarettes    Start date: 10/25/1966    Quit date: 10/25/1998    Years since quitting: 25.4    Passive exposure: Past   Smokeless tobacco: Never  Vaping Use   Vaping status: Never Used  Substance Use  Topics   Alcohol use: Not Currently   Drug use: Not Currently    Medications   Prior to Admission medications   Medication Sig Start Date End Date Taking? Authorizing Provider  acetaminophen  (TYLENOL ) 325 MG tablet Take 1-2 tablets (325-650 mg total) by mouth every 4 (four) hours as needed for mild pain. 07/19/23  Yes Setzer, Hamp Levine, PA-C  chlorpheniramine-HYDROcodone  (TUSSIONEX) 10-8 MG/5ML Take 5 mLs by mouth every 12 (twelve) hours as needed for cough. 03/11/24  Yes Alison Irvine, FNP  Cholecalciferol  (VITAMIN D ) 50 MCG (2000 UT) tablet Take 2,000 Units by mouth daily.   Yes [provider]  clopidogrel  (PLAVIX ) 75 MG tablet Take 1 tablet (75 mg total) by mouth daily. 08/02/23  Yes Tobi Fortes, MD  diltiazem  (CARDIZEM  CD) 180 MG 24 hr capsule Take 1 capsule (180 mg total) by mouth daily. 01/02/24  Yes Tobi Fortes, MD  diphenoxylate -atropine  (LOMOTIL ) 2.5-0.025 MG tablet Take 1 tablet by mouth 4 (four) times daily as needed for diarrhea or loose stools. 11/01/23  Yes Tobi Fortes, MD  empagliflozin  (JARDIANCE ) 10 MG TABS tablet Take 1 tablet (10 mg total) by mouth daily. Patient not taking: Reported on 03/17/2024 07/30/23  Yes Dunn, Dayna N, PA-C  escitalopram  (LEXAPRO ) 10 MG tablet Take 1 tablet (10 mg total) by mouth daily. 08/19/23  Yes Raulkar, Keven Pel, MD  Fluticasone -Umeclidin-Vilant (TRELEGY ELLIPTA ) 100-62.5-25 MCG/ACT AEPB Inhale 1 puff into the lungs daily. 12/25/23  Yes Raejean Bullock, NP  gabapentin  (NEURONTIN ) 300 MG capsule Take 1 capsule (300 mg total) by mouth at bedtime. 02/01/23  Yes Tobi Fortes, MD  irbesartan  (AVAPRO ) 300 MG tablet Take 1 tablet (300 mg total) by mouth daily. 08/19/23  Yes Lasalle Pointer, NP  l-methylfolate-B6-B12 (METANX) 3-35-2 MG TABS tablet Take 1 tablet by mouth daily. 07/20/23  Yes Setzer, Hamp Levine, PA-C  magnesium  oxide (MAG-OX) 400 MG tablet Take 1.5 tablets (600 mg total) by mouth at bedtime. 07/19/23  Yes Setzer, Hamp Levine, PA-C  metformin  (FORTAMET ) 500 MG (OSM) 24 hr tablet Take 1 tablet (500 mg total) by mouth daily with breakfast. Patient not taking: Reported on 03/17/2024 12/17/23  Yes Raulkar, Keven Pel, MD  metoprolol  succinate (TOPROL -XL) 100 MG 24 hr tablet Take 1 tablet (100 mg total) by mouth daily. Take with or immediately following a meal. 07/30/23 11/07/24 Yes Dunn, Dayna N, PA-C  nitroGLYCERIN  (NITROSTAT ) 0.4 MG SL tablet Place 1 tablet (0.4 mg total) under the tongue every 5 (five) minutes as needed for chest pain. 06/12/22 08/04/24 Yes Dunn, Dayna N, PA-C  prochlorperazine  (COMPAZINE ) 10 MG tablet Take 1 tablet (10 mg total) by mouth every 6 (six) hours as needed for nausea or vomiting. 11/01/23  Yes Tobi Fortes, MD  rosuvastatin  (CRESTOR ) 40 MG tablet TAKE ONE (1) TABLET BY MOUTH EVERY DAY 04/09/23  Yes Tobi Fortes, MD  tamsulosin  (FLOMAX ) 0.4 MG CAPS capsule Take 1 capsule (0.4 mg total) by mouth daily after supper. 08/19/23  Yes Raulkar, Keven Pel, MD  traZODone  (DESYREL ) 50 MG tablet Take 1 tablet (50 mg total) by mouth at bedtime. 06/21/23  Yes White, Polly Brink A, NP  azithromycin  (ZITHROMAX  Z-PAK) 250 MG tablet Take 2 tablets (500 mg) PO today, then 1 tablet (250 mg) PO daily x4 days. 03/16/24   Alison Irvine, FNP  Blood Pressure Monitoring (OMRON 3 SERIES BP MONITOR) DEVI As directed 08/05/23   Lasalle Pointer, NP    Allergies   Allergies  Allergen Reactions   Cephalexin Anaphylaxis    throat swelling Patient said he can take amoxicillin   Amlodipine  Swelling    Leg swelling    Review of Systems  ROS  Neurologic Exam  Awake, alert, oriented Memory and concentration grossly intact Speech fluent, appropriate CN grossly intact Motor exam: Upper Extremities Deltoid Bicep Tricep Grip  Right 5/5 5/5 5/5 5/5  Left 5/5 5/5 5/5 5/5   Lower Extremities IP Quad PF DF EHL  Right 5/5 5/5 5/5 5/5 5/5  Left 5/5 5/5 5/5 5/5 5/5   Sensation grossly intact to LT  Imaging  MRI dated  September 2024 was personally reviewed and does demonstrate slight ventriculomegaly, somewhat out of proportion to the degree of cerebral atrophy.  Impression  73 year old man with symptoms of gait instability, as well some short-term memory loss and urinary incontinence.  Suspect this is multifactorial however his significant improvement after high-volume lumbar puncture does suggest that normal pressure hydrocephalus is at least contributing to his symptoms.  I therefore do think he would be a candidate for ventriculoperitoneal shunt.  Plan  - Will proceed with laparoscipic-assisted VP shunt placement  I have reviewed the indications for the procedure as well as the details of the procedure and the expected postoperative course and recovery at length with the patient and family in the office. We have also reviewed in detail the risks, benefits, and alternatives to the procedure. All questions were answered and Jigar D Solis provided informed consent to proceed.  Augusto Blonder, MD [facility]

## 2024-03-20 ENCOUNTER — Encounter (HOSPITAL_COMMUNITY)
Admission: RE | Disposition: A | Discharge status: Home / Self Care | Payer: Self-pay | Source: Home / Self Care | Attending: Neurosurgery

## 2024-03-20 ENCOUNTER — Inpatient Hospital Stay (HOSPITAL_COMMUNITY): Payer: Self-pay | Admitting: Physician Assistant

## 2024-03-20 ENCOUNTER — Encounter (HOSPITAL_COMMUNITY): Payer: Self-pay | Admitting: Neurosurgery

## 2024-03-20 ENCOUNTER — Inpatient Hospital Stay (HOSPITAL_COMMUNITY)
Admission: RE | Admit: 2024-03-20 | Discharge: 2024-03-21 | DRG: 032 | Disposition: A | Discharge status: Home / Self Care | Attending: Neurological Surgery | Admitting: Neurological Surgery

## 2024-03-20 ENCOUNTER — Inpatient Hospital Stay (HOSPITAL_COMMUNITY): Payer: Self-pay | Admitting: Anesthesiology

## 2024-03-20 ENCOUNTER — Other Ambulatory Visit: Payer: Self-pay

## 2024-03-20 DIAGNOSIS — I509 Heart failure, unspecified: Secondary | ICD-10-CM | POA: Diagnosis not present

## 2024-03-20 DIAGNOSIS — I2510 Atherosclerotic heart disease of native coronary artery without angina pectoris: Secondary | ICD-10-CM

## 2024-03-20 DIAGNOSIS — Z7984 Long term (current) use of oral hypoglycemic drugs: Secondary | ICD-10-CM | POA: Diagnosis not present

## 2024-03-20 DIAGNOSIS — Z85118 Personal history of other malignant neoplasm of bronchus and lung: Secondary | ICD-10-CM | POA: Diagnosis not present

## 2024-03-20 DIAGNOSIS — Z87891 Personal history of nicotine dependence: Secondary | ICD-10-CM | POA: Diagnosis not present

## 2024-03-20 DIAGNOSIS — I110 Hypertensive heart disease with heart failure: Secondary | ICD-10-CM | POA: Diagnosis not present

## 2024-03-20 DIAGNOSIS — N182 Chronic kidney disease, stage 2 (mild): Secondary | ICD-10-CM | POA: Diagnosis present

## 2024-03-20 DIAGNOSIS — E785 Hyperlipidemia, unspecified: Secondary | ICD-10-CM | POA: Diagnosis present

## 2024-03-20 DIAGNOSIS — I5032 Chronic diastolic (congestive) heart failure: Secondary | ICD-10-CM | POA: Diagnosis present

## 2024-03-20 DIAGNOSIS — J4489 Other specified chronic obstructive pulmonary disease: Secondary | ICD-10-CM | POA: Diagnosis present

## 2024-03-20 DIAGNOSIS — I130 Hypertensive heart and chronic kidney disease with heart failure and stage 1 through stage 4 chronic kidney disease, or unspecified chronic kidney disease: Secondary | ICD-10-CM | POA: Diagnosis present

## 2024-03-20 DIAGNOSIS — Z8673 Personal history of transient ischemic attack (TIA), and cerebral infarction without residual deficits: Secondary | ICD-10-CM | POA: Diagnosis not present

## 2024-03-20 DIAGNOSIS — Z955 Presence of coronary angioplasty implant and graft: Secondary | ICD-10-CM

## 2024-03-20 DIAGNOSIS — Z7902 Long term (current) use of antithrombotics/antiplatelets: Secondary | ICD-10-CM | POA: Diagnosis not present

## 2024-03-20 DIAGNOSIS — G912 (Idiopathic) normal pressure hydrocephalus: Secondary | ICD-10-CM | POA: Diagnosis present

## 2024-03-20 HISTORY — PX: LAPAROSCOPY: SHX197

## 2024-03-20 HISTORY — PX: VENTRICULOPERITONEAL SHUNT: SHX204

## 2024-03-20 SURGERY — SHUNT INSERTION VENTRICULAR-PERITONEAL
Anesthesia: General | Site: Head

## 2024-03-20 MED ORDER — OXYCODONE HCL 5 MG PO TABS
5.0000 mg | ORAL_TABLET | Freq: Once | ORAL | Status: AC | PRN
  Administered 2024-03-20: 5 mg via ORAL

## 2024-03-20 MED ORDER — ORAL CARE MOUTH RINSE
15.0000 mL | Freq: Once | OROMUCOSAL | Status: AC
Start: 2024-03-20 — End: 2024-03-20

## 2024-03-20 MED ORDER — CHLORHEXIDINE GLUCONATE CLOTH 2 % EX PADS: 6.0000 | MEDICATED_PAD | Freq: Once | CUTANEOUS | Status: DC

## 2024-03-20 MED ORDER — BUPIVACAINE-EPINEPHRINE (PF) 0.25% -1:200000 IJ SOLN
INTRAMUSCULAR | Status: AC
  Filled 2024-03-20: qty 30

## 2024-03-20 MED ORDER — EPHEDRINE SULFATE-NACL 50-0.9 MG/10ML-% IV SOSY
PREFILLED_SYRINGE | INTRAVENOUS | Status: DC | PRN
Start: 2024-03-20 — End: 2024-03-20
  Administered 2024-03-20 (×2): 5 mg via INTRAVENOUS

## 2024-03-20 MED ORDER — HYDROCODONE-ACETAMINOPHEN 5-325 MG PO TABS: 1.0000 | ORAL_TABLET | ORAL | Status: DC | PRN

## 2024-03-20 MED ORDER — ROCURONIUM BROMIDE 10 MG/ML (PF) SYRINGE
PREFILLED_SYRINGE | INTRAVENOUS | Status: AC
  Filled 2024-03-20: qty 10

## 2024-03-20 MED ORDER — LIDOCAINE-EPINEPHRINE 1 %-1:100000 IJ SOLN
INTRAMUSCULAR | Status: DC | PRN
  Administered 2024-03-20: 6 mL via SURGICAL_CAVITY

## 2024-03-20 MED ORDER — TRAZODONE HCL 50 MG PO TABS
50.0000 mg | ORAL_TABLET | Freq: Every day | ORAL | Status: DC
  Administered 2024-03-20: 50 mg via ORAL
  Filled 2024-03-20: qty 1

## 2024-03-20 MED ORDER — DIPHENOXYLATE-ATROPINE 2.5-0.025 MG PO TABS: 1.0000 | ORAL_TABLET | Freq: Four times a day (QID) | ORAL | Status: DC | PRN

## 2024-03-20 MED ORDER — BACITRACIN ZINC 500 UNIT/GM EX OINT
TOPICAL_OINTMENT | CUTANEOUS | Status: AC
Start: 2024-03-20 — End: 2024-03-20
  Filled 2024-03-20: qty 28.35

## 2024-03-20 MED ORDER — HYDROCOD POLI-CHLORPHE POLI ER 10-8 MG/5ML PO SUER: 5.0000 mL | Freq: Two times a day (BID) | ORAL | Status: DC | PRN

## 2024-03-20 MED ORDER — LACTATED RINGERS IV SOLN: INTRAVENOUS | Status: DC

## 2024-03-20 MED ORDER — FENTANYL CITRATE (PF) 250 MCG/5ML IJ SOLN
INTRAMUSCULAR | Status: DC | PRN
  Administered 2024-03-20: 50 ug via INTRAVENOUS
  Administered 2024-03-20: 100 ug via INTRAVENOUS

## 2024-03-20 MED ORDER — THROMBIN 5000 UNITS EX SOLR: OROMUCOSAL | Status: DC | PRN

## 2024-03-20 MED ORDER — ACETAMINOPHEN 650 MG RE SUPP: 650.0000 mg | RECTAL | Status: DC | PRN

## 2024-03-20 MED ORDER — CHLORHEXIDINE GLUCONATE 0.12 % MT SOLN
15.0000 mL | Freq: Once | OROMUCOSAL | Status: AC
Start: 2024-03-20 — End: 2024-03-20

## 2024-03-20 MED ORDER — PHENYLEPHRINE HCL-NACL 20-0.9 MG/250ML-% IV SOLN
INTRAVENOUS | Status: DC | PRN
  Administered 2024-03-20: 40 ug/min via INTRAVENOUS

## 2024-03-20 MED ORDER — PROPOFOL 10 MG/ML IV BOLUS
INTRAVENOUS | Status: AC
Start: 2024-03-20 — End: 2024-03-20
  Filled 2024-03-20: qty 20

## 2024-03-20 MED ORDER — SUGAMMADEX SODIUM 200 MG/2ML IV SOLN
INTRAVENOUS | Status: DC | PRN
  Administered 2024-03-20: 200 mg via INTRAVENOUS

## 2024-03-20 MED ORDER — BUDESON-GLYCOPYRROL-FORMOTEROL 160-9-4.8 MCG/ACT IN AERO
2.0000 | INHALATION_SPRAY | Freq: Two times a day (BID) | RESPIRATORY_TRACT | Status: DC
  Administered 2024-03-20 – 2024-03-21 (×2): 2 via RESPIRATORY_TRACT
  Filled 2024-03-20: qty 5.9

## 2024-03-20 MED ORDER — VANCOMYCIN HCL IN DEXTROSE 1-5 GM/200ML-% IV SOLN
INTRAVENOUS | Status: AC
  Administered 2024-03-20: 1000 mg via INTRAVENOUS
  Filled 2024-03-20: qty 200

## 2024-03-20 MED ORDER — DEXAMETHASONE SODIUM PHOSPHATE 10 MG/ML IJ SOLN
INTRAMUSCULAR | Status: DC | PRN
  Administered 2024-03-20: 10 mg via INTRAVENOUS

## 2024-03-20 MED ORDER — VANCOMYCIN HCL IN DEXTROSE 1-5 GM/200ML-% IV SOLN: 1000.0000 mg | INTRAVENOUS | Status: AC

## 2024-03-20 MED ORDER — BUPIVACAINE HCL 0.25 % IJ SOLN
INTRAMUSCULAR | Status: DC | PRN
  Administered 2024-03-20: 8 mL

## 2024-03-20 MED ORDER — LABETALOL HCL 5 MG/ML IV SOLN: 10.0000 mg | INTRAVENOUS | Status: DC | PRN

## 2024-03-20 MED ORDER — ROSUVASTATIN CALCIUM 20 MG PO TABS
40.0000 mg | ORAL_TABLET | Freq: Every day | ORAL | Status: DC
  Administered 2024-03-20 – 2024-03-21 (×2): 40 mg via ORAL
  Filled 2024-03-20 (×3): qty 2

## 2024-03-20 MED ORDER — ROCURONIUM BROMIDE 10 MG/ML (PF) SYRINGE
PREFILLED_SYRINGE | INTRAVENOUS | Status: DC | PRN
  Administered 2024-03-20: 50 mg via INTRAVENOUS
  Administered 2024-03-20 (×2): 20 mg via INTRAVENOUS

## 2024-03-20 MED ORDER — VANCOMYCIN HCL IN DEXTROSE 1-5 GM/200ML-% IV SOLN
1000.0000 mg | Freq: Two times a day (BID) | INTRAVENOUS | Status: AC
  Administered 2024-03-20 – 2024-03-21 (×2): 1000 mg via INTRAVENOUS
  Filled 2024-03-20 (×2): qty 200

## 2024-03-20 MED ORDER — LIDOCAINE-EPINEPHRINE 1 %-1:100000 IJ SOLN
INTRAMUSCULAR | Status: AC
  Filled 2024-03-20: qty 1

## 2024-03-20 MED ORDER — LIDOCAINE 2% (20 MG/ML) 5 ML SYRINGE
INTRAMUSCULAR | Status: DC | PRN
Start: 2024-03-20 — End: 2024-03-20
  Administered 2024-03-20: 80 mg via INTRAVENOUS

## 2024-03-20 MED ORDER — ACETAMINOPHEN 500 MG PO TABS
1000.0000 mg | ORAL_TABLET | Freq: Once | ORAL | Status: AC
  Administered 2024-03-20: 1000 mg via ORAL
  Filled 2024-03-20: qty 2

## 2024-03-20 MED ORDER — ONDANSETRON HCL 4 MG/2ML IJ SOLN: 4.0000 mg | Freq: Once | INTRAMUSCULAR | Status: DC | PRN

## 2024-03-20 MED ORDER — DILTIAZEM HCL ER COATED BEADS 180 MG PO CP24
180.0000 mg | ORAL_CAPSULE | Freq: Every day | ORAL | Status: DC
  Administered 2024-03-20: 180 mg via ORAL
  Filled 2024-03-20 (×2): qty 1

## 2024-03-20 MED ORDER — CHLORHEXIDINE GLUCONATE 0.12 % MT SOLN
OROMUCOSAL | Status: AC
  Administered 2024-03-20: 15 mL via OROMUCOSAL
  Filled 2024-03-20: qty 15

## 2024-03-20 MED ORDER — FENTANYL CITRATE (PF) 100 MCG/2ML IJ SOLN: 25.0000 ug | INTRAMUSCULAR | Status: DC | PRN

## 2024-03-20 MED ORDER — OXYCODONE-ACETAMINOPHEN 5-325 MG PO TABS
1.0000 | ORAL_TABLET | ORAL | Status: DC | PRN
  Filled 2024-03-20: qty 1

## 2024-03-20 MED ORDER — THROMBIN 5000 UNITS EX KIT
PACK | CUTANEOUS | Status: AC
  Filled 2024-03-20: qty 2

## 2024-03-20 MED ORDER — OXYCODONE HCL 5 MG PO TABS
ORAL_TABLET | ORAL | Status: AC
  Filled 2024-03-20: qty 1

## 2024-03-20 MED ORDER — BUPIVACAINE HCL (PF) 0.5 % IJ SOLN
INTRAMUSCULAR | Status: AC
  Filled 2024-03-20: qty 30

## 2024-03-20 MED ORDER — NITROGLYCERIN 0.4 MG SL SUBL: 0.4000 mg | SUBLINGUAL_TABLET | SUBLINGUAL | Status: DC | PRN

## 2024-03-20 MED ORDER — PHENYLEPHRINE 80 MCG/ML (10ML) SYRINGE FOR IV PUSH (FOR BLOOD PRESSURE SUPPORT)
PREFILLED_SYRINGE | INTRAVENOUS | Status: DC | PRN
  Administered 2024-03-20 (×5): 80 ug via INTRAVENOUS

## 2024-03-20 MED ORDER — 0.9 % SODIUM CHLORIDE (POUR BTL) OPTIME
TOPICAL | Status: DC | PRN
  Administered 2024-03-20: 1000 mL

## 2024-03-20 MED ORDER — THROMBIN (RECOMBINANT) 5000 UNITS EX SOLR
CUTANEOUS | Status: AC
  Filled 2024-03-20: qty 5000

## 2024-03-20 MED ORDER — ONDANSETRON HCL 4 MG PO TABS: 4.0000 mg | ORAL_TABLET | ORAL | Status: DC | PRN

## 2024-03-20 MED ORDER — ESCITALOPRAM OXALATE 10 MG PO TABS
10.0000 mg | ORAL_TABLET | Freq: Every day | ORAL | Status: DC
  Administered 2024-03-20 – 2024-03-21 (×2): 10 mg via ORAL
  Filled 2024-03-20 (×2): qty 1

## 2024-03-20 MED ORDER — PROCHLORPERAZINE MALEATE 10 MG PO TABS: 10.0000 mg | ORAL_TABLET | Freq: Four times a day (QID) | ORAL | Status: DC | PRN

## 2024-03-20 MED ORDER — TAMSULOSIN HCL 0.4 MG PO CAPS
0.4000 mg | ORAL_CAPSULE | Freq: Every day | ORAL | Status: DC
  Administered 2024-03-20: 0.4 mg via ORAL
  Filled 2024-03-20: qty 1

## 2024-03-20 MED ORDER — LIDOCAINE 2% (20 MG/ML) 5 ML SYRINGE
INTRAMUSCULAR | Status: AC
  Filled 2024-03-20: qty 5

## 2024-03-20 MED ORDER — PANTOPRAZOLE SODIUM 40 MG PO TBEC
40.0000 mg | DELAYED_RELEASE_TABLET | Freq: Every day | ORAL | Status: DC
  Administered 2024-03-20: 40 mg via ORAL
  Filled 2024-03-20: qty 1

## 2024-03-20 MED ORDER — OXYCODONE HCL 5 MG/5ML PO SOLN: 5.0000 mg | Freq: Once | ORAL | Status: AC | PRN

## 2024-03-20 MED ORDER — ONDANSETRON HCL 4 MG/2ML IJ SOLN
INTRAMUSCULAR | Status: DC | PRN
  Administered 2024-03-20: 4 mg via INTRAVENOUS

## 2024-03-20 MED ORDER — GABAPENTIN 300 MG PO CAPS
300.0000 mg | ORAL_CAPSULE | Freq: Every day | ORAL | Status: DC
  Administered 2024-03-20: 300 mg via ORAL
  Filled 2024-03-20: qty 1

## 2024-03-20 MED ORDER — FENTANYL CITRATE (PF) 250 MCG/5ML IJ SOLN
INTRAMUSCULAR | Status: AC
  Filled 2024-03-20: qty 5

## 2024-03-20 MED ORDER — PROPOFOL 10 MG/ML IV BOLUS
INTRAVENOUS | Status: DC | PRN
Start: 2024-03-20 — End: 2024-03-20
  Administered 2024-03-20: 160 mg via INTRAVENOUS

## 2024-03-20 MED ORDER — IRBESARTAN 300 MG PO TABS
300.0000 mg | ORAL_TABLET | Freq: Every day | ORAL | Status: DC
  Administered 2024-03-20 – 2024-03-21 (×2): 300 mg via ORAL
  Filled 2024-03-20 (×2): qty 1

## 2024-03-20 MED ORDER — METOPROLOL SUCCINATE ER 100 MG PO TB24
100.0000 mg | ORAL_TABLET | Freq: Every day | ORAL | Status: DC
  Administered 2024-03-20 – 2024-03-21 (×2): 100 mg via ORAL
  Filled 2024-03-20 (×2): qty 1

## 2024-03-20 MED ORDER — PANTOPRAZOLE SODIUM 40 MG IV SOLR: 40.0000 mg | Freq: Every day | INTRAVENOUS | Status: DC

## 2024-03-20 MED ORDER — ONDANSETRON HCL 4 MG/2ML IJ SOLN
4.0000 mg | INTRAMUSCULAR | Status: DC | PRN
  Administered 2024-03-20: 4 mg via INTRAVENOUS
  Filled 2024-03-20: qty 2

## 2024-03-20 MED ORDER — ACETAMINOPHEN 325 MG PO TABS
650.0000 mg | ORAL_TABLET | ORAL | Status: DC | PRN
Start: 2024-03-20 — End: 2024-03-21

## 2024-03-20 SURGICAL SUPPLY — 67 items
BAG COUNTER SPONGE SURGICOUNT (BAG) ×4 IMPLANT
BLADE CLIPPER SURG (BLADE) ×4 IMPLANT
BLADE SURG 11 STRL SS (BLADE) ×4 IMPLANT
BUR PRECISION FLUTE 5.0 (BURR) ×2 IMPLANT
CANISTER SUCTION 3000ML PPV (SUCTIONS) ×2 IMPLANT
CATH VENTRICULAR 8 (Shunt) IMPLANT
CHLORAPREP W/TINT 26 (MISCELLANEOUS) ×2 IMPLANT
CLAMP SUTURE YELLOW 5 PAIRS (MISCELLANEOUS) IMPLANT
CLIP RANEY DISP (INSTRUMENTS) IMPLANT
COVER SURGICAL LIGHT HANDLE (MISCELLANEOUS) ×2 IMPLANT
DERMABOND ADVANCED .7 DNX12 (GAUZE/BANDAGES/DRESSINGS) IMPLANT
DRAPE HALF SHEET 40X57 (DRAPES) ×2 IMPLANT
DRAPE INCISE IOBAN 66X45 STRL (DRAPES) ×2 IMPLANT
DRAPE SURG ORHT 6 SPLT 77X108 (DRAPES) ×4 IMPLANT
DURAPREP 26ML APPLICATOR (WOUND CARE) ×4 IMPLANT
ELECTRODE REM PT RTRN 9FT ADLT (ELECTROSURGICAL) ×4 IMPLANT
GAUZE 4X4 16PLY ~~LOC~~+RFID DBL (SPONGE) IMPLANT
GLOVE BIOGEL PI IND STRL 6 (GLOVE) ×2 IMPLANT
GLOVE BIOGEL PI IND STRL 7.5 (GLOVE) ×2 IMPLANT
GLOVE BIOGEL PI MICRO STRL 5.5 (GLOVE) ×2 IMPLANT
GLOVE ECLIPSE 7.0 STRL STRAW (GLOVE) ×4 IMPLANT
GLOVE EXAM NITRILE XL STR (GLOVE) IMPLANT
GOWN STRL REUS W/ TWL LRG LVL3 (GOWN DISPOSABLE) ×8 IMPLANT
GOWN STRL REUS W/ TWL XL LVL3 (GOWN DISPOSABLE) IMPLANT
GOWN STRL REUS W/TWL 2XL LVL3 (GOWN DISPOSABLE) IMPLANT
HEMOSTAT SURGICEL 2X14 (HEMOSTASIS) IMPLANT
IRRIGATION SUCT STRKRFLW 2 WTP (MISCELLANEOUS) IMPLANT
KIT BASIN OR (CUSTOM PROCEDURE TRAY) ×4 IMPLANT
KIT TURNOVER KIT B (KITS) ×4 IMPLANT
MARKER SKIN DUAL TIP RULER LAB (MISCELLANEOUS) ×4 IMPLANT
NDL HYPO 25X1 1.5 SAFETY (NEEDLE) ×2 IMPLANT
NDL INSUFFLATION 14GA 120MM (NEEDLE) ×2 IMPLANT
NEEDLE HYPO 25X1 1.5 SAFETY (NEEDLE) ×2 IMPLANT
NEEDLE INSUFFLATION 14GA 120MM (NEEDLE) ×2 IMPLANT
NS IRRIG 1000ML POUR BTL (IV SOLUTION) ×4 IMPLANT
PACK LAMINECTOMY NEURO (CUSTOM PROCEDURE TRAY) ×2 IMPLANT
PAD ARMBOARD POSITIONER FOAM (MISCELLANEOUS) ×10 IMPLANT
PASSER CATH 65CM DISP (NEUROSURGERY SUPPLIES) ×2 IMPLANT
PATTIES SURGICAL .5 X.5 (GAUZE/BANDAGES/DRESSINGS) ×2 IMPLANT
PATTIES SURGICAL 1X1 (DISPOSABLE) ×2 IMPLANT
SCISSORS LAP 5X35 DISP (ENDOMECHANICALS) IMPLANT
SET TUBE SMOKE EVAC HIGH FLOW (TUBING) ×2 IMPLANT
SHEATH PERITONEAL INTRO 61 (SHEATH) ×2 IMPLANT
SHUNT STRATA 11 SNAP REG (Shunt) IMPLANT
SLEEVE Z-THREAD 5X100MM (TROCAR) ×2 IMPLANT
SPIKE FLUID TRANSFER (MISCELLANEOUS) ×2 IMPLANT
SPONGE SURGIFOAM ABS GEL SZ50 (HEMOSTASIS) ×2 IMPLANT
SPONGE T-LAP 4X18 ~~LOC~~+RFID (SPONGE) IMPLANT
STAPLER SKIN PROX 35W (STAPLE) ×2 IMPLANT
STRIP CLOSURE SKIN 1/2X4 (GAUZE/BANDAGES/DRESSINGS) IMPLANT
SUT ETHILON 3 0 PS 1 (SUTURE) IMPLANT
SUT MNCRL AB 4-0 PS2 18 (SUTURE) ×2 IMPLANT
SUT NURALON 4 0 TR CR/8 (SUTURE) IMPLANT
SUT SILK 0 100YDS SPOOL (SUTURE) ×2 IMPLANT
SUT SILK 3 0 SH 30 (SUTURE) IMPLANT
SUT VIC AB 3-0 SH 27X BRD (SUTURE) ×2 IMPLANT
SUT VIC AB 3-0 SH 8-18 (SUTURE) ×4 IMPLANT
SYR 30ML SLIP (SYRINGE) ×2 IMPLANT
TOWEL GREEN STERILE (TOWEL DISPOSABLE) ×6 IMPLANT
TOWEL GREEN STERILE FF (TOWEL DISPOSABLE) ×4 IMPLANT
TRAY LAPAROSCOPIC MC (CUSTOM PROCEDURE TRAY) ×2 IMPLANT
TROCAR BALLN 12MMX100 BLUNT (TROCAR) IMPLANT
TROCAR Z-THREAD OPTICAL 5X100M (TROCAR) ×2 IMPLANT
TUBE CONNECTING 12X1/4 (SUCTIONS) ×2 IMPLANT
UNDERPAD 30X36 HEAVY ABSORB (UNDERPADS AND DIAPERS) ×2 IMPLANT
WARMER LAPAROSCOPE (MISCELLANEOUS) ×2 IMPLANT
WATER STERILE IRR 1000ML POUR (IV SOLUTION) ×2 IMPLANT

## 2024-03-20 NOTE — Anesthesia Procedure Notes (Signed)
 Procedure Name: Intubation Date/Time: 03/20/2024 8:02 AM  Performed by: Grier Leber, CRNAPre-anesthesia Checklist: Patient identified, Emergency Drugs available, Suction available and Patient being monitored Patient Re-evaluated:Patient Re-evaluated prior to induction Oxygen Delivery Method: Circle System Utilized Preoxygenation: Pre-oxygenation with 100% oxygen Induction Type: IV induction Ventilation: Mask ventilation without difficulty, Two handed mask ventilation required and Oral airway inserted - appropriate to patient size Laryngoscope Size: Mac and 4 Grade View: Grade I Tube type: Oral Tube size: 7.5 mm Number of attempts: 1 Airway Equipment and Method: Stylet and Oral airway Placement Confirmation: ETT inserted through vocal cords under direct vision, positive ETCO2 and breath sounds checked- equal and bilateral Secured at: 22 cm Tube secured with: Tape Dental Injury: Teeth and Oropharynx as per pre-operative assessment  Comments: 2-handed mask for face seal d/t beard. Easy mask ventilation and easy Grade 1 intubation.

## 2024-03-20 NOTE — Transfer of Care (Signed)
 Immediate Anesthesia Transfer of Care Note  Patient: Jeremy Lopez  Procedure(s) Performed: SHUNT INSERTION VENTRICULAR-PERITONEAL (Head) LAPAROSCOPY, DIAGNOSTIC (Abdomen)  Patient Location: PACU  Anesthesia Type:General  Level of Consciousness: awake, alert , oriented, and patient cooperative  Airway & Oxygen Therapy: Patient Spontanous Breathing  Post-op Assessment: Report given to RN and Post -op Vital signs reviewed and stable  Post vital signs: Reviewed and stable  Last Vitals:  Vitals Value Taken Time  BP 154/81 03/20/24 09:15  Temp    Pulse 76 03/20/24 09:16  Resp 17 03/20/24 09:16  SpO2 92 % 03/20/24 09:16  Vitals shown include unfiled device data.  Last Pain:  Vitals:   03/20/24 0710  TempSrc: Oral  PainSc:          Complications: No notable events documented.

## 2024-03-20 NOTE — Op Note (Addendum)
 Date: 03/20/24  Patient: Jeremy Lopez MRN: [id number]  Preoperative Diagnosis: Normal pressure hydrocephalus Postoperative Diagnosis: Same  Procedure: Laparoscopic assistance with ventriculoperitoneal shunt placement  Surgeon: Karleen Overall, MD Co-Surgeon: Augusto Blonder, MD  EBL: Minimal  Anesthesia: General  Specimens: None  Indications: Mr. Reppond is a 73 yo male who was referred for neurosurgery for evaluation of NPH with symptoms. VP shunt placement was planned, and general surgery assistance was requested for laparoscopic placement.  Findings: VP shunt catheter tip placed in the pelvis under laparoscopic visualization. Catheter tip was patent and draining at the completion of the procedure.  Procedure details: Informed consent was obtained in the preoperative area prior to the procedure. The patient was brought to the operating room and placed on the table in the supine position. General anesthesia was induced and the patient was prepped and draped in the usual sterile fashion. A pre-procedure timeout was taken verifying patient identity, surgical site and procedure to be performed.  A small infraumbilical skin incision was made, the umbilical stalk was grasped and elevated, and a Veress needle was inserted through the fascia.  Intraperitoneal placement was confirmed with the saline drop test, the abdomen was insufflated, and a 5 mm Visiport was placed.  The peritoneal cavity was inspected with no evidence of visceral or vascular injury.  There were no intra-abdominal adhesions.  An additional 5 mm port was placed in the left lateral abdomen under direct visualization.  The shunt catheter was then tunneled to the right upper quadrant abdominal wall by Dr. Nat Badger (please see separately dictated operative report), and the skin was incised at this point.  A fascial defect was bluntly created, and the catheter was passed into the peritoneal cavity under direct visualization.  The  catheter tip was placed in the pelvis.  At the completion of the procedure, the catheter tip was visualized and was patent and draining CSF. The ports were removed and the abdomen was desufflated.  The incisions were closed with 4-0 Monocryl subcuticular suture.  Dermabond was applied.  The patient tolerated the procedure well with no apparent complications. All counts were correct x2 at the end of the procedure.  Karleen Overall, MD 03/20/24 9:02 AM

## 2024-03-20 NOTE — Progress Notes (Signed)
 Pharmacy Antibiotic Note  Jeremy Lopez is a 73 y.o. male admitted on 03/20/2024 with surgical prophylaxis.  Pharmacy has been consulted for Vancomycin dosing.  Plan: Vancomycin 1000 mg q12h x 2 doses.  Pharmacy will sign off.   Height: 5' 10 (177.8 cm) Weight: 89.4 kg (197 lb) IBW/kg (Calculated) : 73  Temp (24hrs), Avg:97.8 F (36.6 C), Min:97.7 F (36.5 C), Max:97.9 F (36.6 C)  Recent Labs  Lab 03/17/24 1430  WBC 10.5  CREATININE 1.12    Estimated Creatinine Clearance: 67.1 mL/min (by C-G formula based on SCr of 1.12 mg/dL).    Allergies  Allergen Reactions   Cephalexin Anaphylaxis    throat swelling Patient said he can take amoxicillin   Amlodipine  Swelling    Leg swelling    Patience Bonito, PharmD, BCPS, BCCCP Clinical Pharmacist

## 2024-03-20 NOTE — Discharge Instructions (Signed)
 Abdominal Incision Care: The incisions on your abdomen are covered with skin glue. This will peel off after about 1-2 weeks. You may shower over your abdominal incisions in 24 hours. Do not bathe, swim, or submerge the incisions underwater for 2 weeks. Monitor your incisions for any redness or drainage. You may experience some shoulder pain after laparoscopic surgery - this is from the gas that was used to inflate your abdomen. This typically resolves after 2-3 days. If you have any questions or concerns about your abdominal incisions, please call the [facility] office at [phone] and ask to speak with Dr. Allyson Ares nurse.

## 2024-03-20 NOTE — Anesthesia Postprocedure Evaluation (Signed)
 Anesthesia Post Note  Patient: Jeremy Lopez  Procedure(s) Performed: SHUNT INSERTION VENTRICULAR-PERITONEAL (Head) LAPAROSCOPY, DIAGNOSTIC (Abdomen)     Patient location during evaluation: PACU Anesthesia Type: General Level of consciousness: awake and alert Pain management: pain level controlled Vital Signs Assessment: post-procedure vital signs reviewed and stable Respiratory status: spontaneous breathing, nonlabored ventilation and respiratory function stable Cardiovascular status: stable and blood pressure returned to baseline Anesthetic complications: no  No notable events documented.  Last Vitals:  Vitals:   03/20/24 0945 03/20/24 1015  BP: (!) 164/85 (!) 159/75  Pulse: 70 68  Resp: 16 11  Temp: 36.6 C   SpO2: 96% 95%    Last Pain:  Vitals:   03/20/24 0915  TempSrc:   PainSc: 0-No pain                 Juventino Oppenheim

## 2024-03-20 NOTE — Op Note (Signed)
  NEUROSURGERY OPERATIVE NOTE   PREOP DIAGNOSIS: Normal pressure hydrocephalus  POSTOP DIAGNOSIS: Same  PROCEDURE: 1. Laparoscopic Assisted ventriculoperitoneal shunt placement  SURGEON: Dr. Augusto Blonder, MD  CO-SURGEON: Dr. Karleen Overall, MD  ANESTHESIA: General Endotracheal  EBL: Minimal  SPECIMENS: None  DRAINS: None  COMPLICATIONS: None immediate  CONDITION: Stable to PACU  SHUNT PLACED: Medtronic Strata II, set to 1.5  HISTORY: Jeremy Lopez is a 73 y.o. male initially presenting to the neurologist office with progressively worsening gait instability as well as some mild urinary difficulty and cognitive impairments.  His MRI did demonstrate ventriculomegaly out of proportion to the degree of brain atrophy.  He underwent high-volume lumbar puncture with significant improvement in his gait.  He was therefore a candidate for ventriculoperitoneal shunting.  The risks, benefits, and alternatives to surgery were all reviewed in detail with the patient and his family.  After all questions were answered informed consent was obtained and witnessed.  PROCEDURE IN DETAIL: The patient was brought to the operating room and transferred to the operative table. After induction of general anesthesia, the patient was positioned on the operative table in the supine position with all pressure points meticulously padded. The skin of the scalp,  neck, chest, and abdomen were prepped in the usual sterile fashion.  After timeout was conducted, the semilunar right frontal skin incision over Kocher's point was infiltrated with local anesthetic with epinephrine.  Incision was then made sharply and carried down through the galea.  Hemostasis was secured.  High-speed drill was used to create a bur hole over Kocher's point.  The dura was then coagulated and incised.  At this point, the shunt was passed into the retroauricular region with a small counterincision.  The valve was seated subcutaneously.   Shunt passer was then used to pass the distal catheter from the retroauricular incision to the epigastrium.  The distal portion of the catheter was then placed into the peritoneal cavity within the pelvis under laparoscopic guidance by Dr. Leighton Punches, the details of which are dictated in a separate report.  At this point, using standard anatomic landmarks a 8 cm ventricular catheter was passed into the right frontal horn in a single pass.  Good clear CSF flow was obtained. The catheter was then connected to the valve apparatus.  The valve was then pumped, and good distal flow was observed through the laparoscope within the peritoneal cavity.  At this point the scalp wounds were irrigated with copious amounts of bacitracin irrigation, and closed using 3-0 Vicryl stitches.  The skin was then closed using standard surgical skin staples.  Sterile dressings were then applied.  At the end of the case all sponge needle and instrument counts were correct.  The patient tolerated the procedure well and was extubated in the room and taken to the postanesthesia care unit in stable condition.   Augusto Blonder, MD [facility]

## 2024-03-20 NOTE — H&P (Signed)
 General Surgery  Patient presents for VP shunt placement. General surgery assistance has been requested for laparoscopic insertion. Patient examined in preop. He has not had any prior abdominal surgeries.  Neuro: alert and oriented Resp: nonlabored respirations on room air Abdomen: soft, nondistended, nontender to palpation. No surgical scars.  Planned laparoscopy was reviewed with the patient. Postoperative instructions for care of abdominal incisions was also reviewed. He expressed understanding and all questions were answered.  Karleen Overall, MD [facility] General, Hepatobiliary and Pancreatic Surgery 03/20/24 6:54 AM

## 2024-03-20 NOTE — Evaluation (Signed)
 Physical Therapy Evaluation Patient Details Name: Jeremy Lopez MRN: [id number] DOB: [birth year] Today's Date: 03/20/2024  History of Present Illness  Pt is a 73 y.o. male admitted 03/20/24 following VP shunt procedure for normal pressure hydrocephalus. PMH positive for CVA 06/2023, CAD, CHF, EtOh use, HTN, HLD, h/o lung CA in remission.  Clinical Impression  Patient mobilizing well without concerns for falls despite previous and still with R foot drag.  Patient negotiated steps demonstrated dynamic gait activities (scored 22/24 on DGI) with low fall risk.  PT will sign off as pt stable and no follow up PT needs.  Did encourage slow progression of activity as pt eager to use his treadmill at home.       If plan is discharge home, recommend the following: Assist for transportation   Can travel by private vehicle        Equipment Recommendations None recommended by PT  Recommendations for Other Services       Functional Status Assessment Patient has not had a recent decline in their functional status     Precautions / Restrictions Precautions Precautions: Other (comment) Precaution/Restrictions Comments: incisions head and abdomen      Mobility  Bed Mobility Overal bed mobility: Modified Independent                  Transfers Overall transfer level: Modified independent Equipment used: None                    Ambulation/Gait Ambulation/Gait assistance: Independent Gait Distance (Feet): 220 Feet Assistive device: None Gait Pattern/deviations: Step-through pattern, Decreased dorsiflexion - right       General Gait Details: scuffing R foot on the floor, though no LOB completed DGI  Stairs Stairs: Yes Stairs assistance: Supervision Stair Management: One rail Left, Alternating pattern, Forwards Number of Stairs: 10 General stair comments: for safety  Wheelchair Mobility     Tilt Bed    Modified Rankin (Stroke Patients Only)       Balance  Overall balance assessment: Modified Independent                               Standardized Balance Assessment Standardized Balance Assessment : Dynamic Gait Index   Dynamic Gait Index Level Surface: Mild Impairment Change in Gait Speed: Normal Gait with Horizontal Head Turns: Normal Gait with Vertical Head Turns: Normal Gait and Pivot Turn: Normal Step Over Obstacle: Normal Step Around Obstacles: Normal Steps: Mild Impairment Total Score: 22       Pertinent Vitals/Pain Pain Assessment Pain Assessment: 0-10 Pain Score: 3  Pain Location: stomach and throat Pain Descriptors / Indicators: Sore Pain Intervention(s): Monitored during session    Home Living Family/patient expects to be discharged to:: Private residence Living Arrangements: Spouse/significant other Available Help at Discharge: Family Type of Home: House Home Access: Stairs to enter Entrance Stairs-Rails: None Entrance Stairs-Number of Steps: 2 Alternate Level Stairs-Number of Steps: 13 (also has Child psychotherapist on main if needed) Home Layout: Two level;Bed/bath upstairs Home Equipment: Pharmacist, hospital (2 wheels)      Prior Function Prior Level of Function : Independent/Modified Independent;Driving             Mobility Comments: denies falls in past 6 months       Extremity/Trunk Assessment   Upper Extremity Assessment Upper Extremity Assessment: Overall WFL for tasks assessed    Lower Extremity Assessment Lower Extremity Assessment: Overall WFL for tasks  assessed (h/o CVA affecting R with some foot drag)       Communication   Communication Communication: No apparent difficulties    Cognition Arousal: Alert Behavior During Therapy: WFL for tasks assessed/performed   PT - Cognitive impairments: No apparent impairments                         Following commands: Intact       Cueing       General Comments General comments (skin integrity, edema, etc.): wife  in the room and supportive; educated pt MD would give instructions for activity at d/c as pt asking about using treadmill    Exercises     Assessment/Plan    PT Assessment Patient does not need any further PT services  PT Problem List         PT Treatment Interventions      PT Goals (Current goals can be found in the Care Plan section)  Acute Rehab PT Goals PT Goal Formulation: All assessment and education complete, DC therapy    Frequency       Co-evaluation               AM-PAC PT 6 Clicks Mobility  Outcome Measure Help needed turning from your back to your side while in a flat bed without using bedrails?: None Help needed moving from lying on your back to sitting on the side of a flat bed without using bedrails?: None Help needed moving to and from a bed to a chair (including a wheelchair)?: None Help needed standing up from a chair using your arms (e.g., wheelchair or bedside chair)?: None Help needed to walk in hospital room?: None Help needed climbing 3-5 steps with a railing? : None 6 Click Score: 24    End of Session Equipment Utilized During Treatment: Gait belt Activity Tolerance: Patient tolerated treatment well Patient left: in bed;with call bell/phone within reach Nurse Communication: Mobility status PT Visit Diagnosis: Other abnormalities of gait and mobility (R26.89)    Time: 1610-9604 PT Time Calculation (min) (ACUTE ONLY): 21 min   Charges:   PT Evaluation $PT Eval Low Complexity: 1 Low   PT General Charges $$ ACUTE PT VISIT: 1 Visit         Abigail Hoff, PT Acute Rehabilitation Services Office:[phone] 03/20/2024  Marley Simmers 03/20/2024, 3:33 PM

## 2024-03-21 MED ORDER — HYDROCODONE-ACETAMINOPHEN 5-325 MG PO TABS: 1.0000 | ORAL_TABLET | ORAL | 0 refills | Status: AC | PRN

## 2024-03-21 NOTE — Discharge Summary (Signed)
 Physician Discharge Summary  Patient ID: Jeremy Lopez MRN: [id number] DOB/AGE: 1951-09-12 73 y.o.  Admit date: 03/20/2024 Discharge date: 03/21/2024  Admission Diagnoses: Normal pressure hydrocephalus  Discharge Diagnoses: Normal pressure hydrocephalus Principal Problem:   Normal pressure hydrocephalus (HCC)   Discharged Condition: good  Hospital Course: Patient tolerated surgery well  Consults: None  Significant Diagnostic Studies: None  Treatments: surgery: See op note  Discharge Exam: Blood pressure (!) 110/56, pulse 63, temperature 97.8 F (36.6 C), temperature source Oral, resp. rate 16, height 5' 10 (1.778 m), weight 89.4 kg, SpO2 90%. Incisions are clean and dry Station and gait  is intact.  Disposition: Discharge disposition: 01-Home or Self Care       Discharge Instructions      Remove dressing in 72 hours   Complete by: As directed    Call MD for:  redness, tenderness, or signs of infection (pain, swelling, redness, odor or green/yellow discharge around incision site)   Complete by: As directed    Call MD for:  severe uncontrolled pain   Complete by: As directed    Call MD for:  temperature >100.4   Complete by: As directed    Diet - low sodium heart healthy   Complete by: As directed    Increase activity slowly   Complete by: As directed       Allergies as of 03/21/2024       Reactions   Cephalexin Anaphylaxis   throat swelling Patient said he can take amoxicillin   Amlodipine  Swelling   Leg swelling        Medication List     TAKE these medications    acetaminophen  325 MG tablet Commonly known as: TYLENOL  Take 1-2 tablets (325-650 mg total) by mouth every 4 (four) hours as needed for mild pain.   azithromycin  250 MG tablet Commonly known as: Zithromax  Z-Pak Take 2 tablets (500 mg) PO today, then 1 tablet (250 mg) PO daily x4 days.   chlorpheniramine-HYDROcodone  10-8 MG/5ML Commonly known as: TUSSIONEX Take 5 mLs by mouth  every 12 (twelve) hours as needed for cough.   clopidogrel  75 MG tablet Commonly known as: PLAVIX  Take 1 tablet (75 mg total) by mouth daily.   diltiazem  180 MG 24 hr capsule Commonly known as: Cardizem  CD Take 1 capsule (180 mg total) by mouth daily.   diphenoxylate -atropine  2.5-0.025 MG tablet Commonly known as: LOMOTIL  Take 1 tablet by mouth 4 (four) times daily as needed for diarrhea or loose stools.   Elfolate Plus 3-35-2 MG Tabs Take 1 tablet by mouth daily.   empagliflozin  10 MG Tabs tablet Commonly known as: JARDIANCE  Take 1 tablet (10 mg total) by mouth daily.   escitalopram  10 MG tablet Commonly known as: Lexapro  Take 1 tablet (10 mg total) by mouth daily.   gabapentin  300 MG capsule Commonly known as: NEURONTIN  Take 1 capsule (300 mg total) by mouth at bedtime.   HYDROcodone -acetaminophen  5-325 MG tablet Commonly known as: NORCO/VICODIN Take 1 tablet by mouth every 4 (four) hours as needed for moderate pain (pain score 4-6).   irbesartan  300 MG tablet Commonly known as: AVAPRO  Take 1 tablet (300 mg total) by mouth daily.   magnesium  oxide 400 MG tablet Commonly known as: MAG-OX Take 1.5 tablets (600 mg total) by mouth at bedtime.   metformin  500 MG (OSM) 24 hr tablet Commonly known as: FORTAMET  Take 1 tablet (500 mg total) by mouth daily with breakfast.   metoprolol  succinate 100 MG 24 hr tablet  Commonly known as: TOPROL -XL Take 1 tablet (100 mg total) by mouth daily. Take with or immediately following a meal.   nitroGLYCERIN  0.4 MG SL tablet Commonly known as: Nitrostat  Place 1 tablet (0.4 mg total) under the tongue every 5 (five) minutes as needed for chest pain.   Omron 3 Series BP Monitor Devi As directed   prochlorperazine  10 MG tablet Commonly known as: COMPAZINE  Take 1 tablet (10 mg total) by mouth every 6 (six) hours as needed for nausea or vomiting.   rosuvastatin  40 MG tablet Commonly known as: CRESTOR  TAKE ONE (1) TABLET BY MOUTH  EVERY DAY   tamsulosin  0.4 MG Caps capsule Commonly known as: Flomax  Take 1 capsule (0.4 mg total) by mouth daily after supper.   traZODone  50 MG tablet Commonly known as: DESYREL  Take 1 tablet (50 mg total) by mouth at bedtime.   Trelegy Ellipta  100-62.5-25 MCG/ACT Aepb Generic drug: Fluticasone -Umeclidin-Vilant Inhale 1 puff into the lungs daily.   Vitamin D  50 MCG (2000 UT) tablet Take 2,000 Units by mouth daily.         Signed: Kevin Lopez Jeremy Lopez 03/21/2024, 10:58 AM

## 2024-03-21 NOTE — Progress Notes (Signed)
  Subjective No acute events. Feeling well. Mild incisional soreness on abd lap sites. No n/v. Feels well.  Objective: Vital signs in last 24 hours: Temp:  [97.7 F (36.5 C)-98.3 F (36.8 C)] 97.8 F (36.6 C) (06/14 0821) Pulse Rate:  [63-79] 63 (06/14 0821) Resp:  [11-18] 16 (06/14 0821) BP: (84-164)/(43-85) 102/52 (06/14 0821) SpO2:  [90 %-96 %] 90 % (06/14 0821)    Intake/Output from previous day: 06/13 0701 - 06/14 0700 In: 800 [I.V.:800] Out: 570 [Urine:550; Blood:20] Intake/Output this shift: No intake/output data recorded.  Gen: NAD, comfortable CV: RRR Pulm: Normal work of breathing Abd: Soft, NT/ND. Incisions clean and dry with dermabond in place. Ext: SCDs in place  Lab Results: CBC  No results for input(s): WBC, HGB, HCT, PLT in the last 72 hours. BMET No results for input(s): NA, K, CL, CO2, GLUCOSE, BUN, CREATININE, CALCIUM  in the last 72 hours. PT/INR No results for input(s): LABPROT, INR in the last 72 hours. ABG No results for input(s): PHART, HCO3 in the last 72 hours.  Invalid input(s): PCO2, PO2  Studies/Results:  Anti-infectives: Anti-infectives (From admission, onward)    Start     Dose/Rate Route Frequency Ordered Stop   03/20/24 1800  vancomycin (VANCOCIN) IVPB 1000 mg/200 mL premix        1,000 mg 200 mL/hr over 60 Minutes Intravenous Every 12 hours 03/20/24 1200 03/21/24 0643   03/20/24 0600  vancomycin (VANCOCIN) IVPB 1000 mg/200 mL premix        1,000 mg 200 mL/hr over 60 Minutes Intravenous On call to O.R. 03/20/24 0556 03/20/24 1153        Assessment/Plan: Patient Active Problem List   Diagnosis Date Noted   Normal pressure hydrocephalus (HCC) 03/20/2024   NPH (normal pressure hydrocephalus) (HCC) 03/11/2024   Abnormality of gait 03/11/2024   Diarrhea 11/01/2023   Acute bacterial bronchitis 09/24/2023   Acute CVA (cerebrovascular accident) (HCC) 07/13/2023   Dizziness 07/12/2023    Cerebellar stroke, acute (HCC) 07/07/2023   CVA (cerebral vascular accident) (HCC) 07/05/2023   Headache 07/05/2023   Prediabetes 02/01/2023   CKD (chronic kidney disease) stage 2, GFR 60-89 ml/min 02/01/2023   Nausea 02/01/2023   Colon cancer screening 02/01/2023   CHF (congestive heart failure) (HCC) 01/23/2023   Numbness and tingling of both lower extremities 01/23/2023   Encounter for general adult medical examination with abnormal findings 01/23/2023   Allergic rhinitis 01/23/2022   Asthmatic bronchitis, mild persistent, uncomplicated 01/23/2022   Upper airway cough syndrome 01/23/2022   Simple chronic bronchitis (HCC) 12/28/2021   Seasonal allergies 12/28/2021   DOE (dyspnea on exertion)    Nosebleed 10/25/2014   Lung cancer (HCC) 02/05/2010   CAD (coronary artery disease), native coronary artery 02/05/2010   Mixed hyperlipidemia 08/16/2009   Essential hypertension 07/08/2009   s/p Procedure(s): SHUNT INSERTION VENTRICULAR-PERITONEAL LAPAROSCOPY, DIAGNOSTIC 03/20/2024  - Doing well from our perspective. Activity and diet as per primary. May bathe with soap/water over abd incisions as they have dermabond in place  - Call us  if any questions or concerns arise.   LOS: 1 day    Beatris Lincoln, MD [facility], [facility]

## 2024-03-21 NOTE — Progress Notes (Signed)
 Patient's IV s taken out by Alston Jerry, the nurse extern, catheter tips intact. Went over discharge paperwork with patient and wife at bedside. No questions or concerns. Milinda, the [employer], took patient down in wheelchair to their vehicle.

## 2024-03-23 ENCOUNTER — Encounter (HOSPITAL_COMMUNITY): Payer: Self-pay | Admitting: Neurosurgery

## 2024-03-23 ENCOUNTER — Telehealth: Payer: Self-pay

## 2024-03-23 NOTE — Transitions of Care (Post Inpatient/ED Visit) (Signed)
 03/23/2024  Name: Jeremy Lopez MRN: [id number] DOB: Nov 29, 1950  Today's TOC FU Call Status: Today's TOC FU Call Status:: Successful TOC FU Call Completed TOC FU Call Complete Date: 03/23/24 Patient's Name and Date of Birth confirmed.  Transition Care Management Follow-up Telephone Call Date of Discharge: 03/21/24 Discharge Facility: [facility]) Type of Discharge: Inpatient Admission Primary Inpatient Discharge Diagnosis::  (VP shunt placed.) How have you been since you were released from the hospital?: Better Any questions or concerns?: No  Items Reviewed: Did you receive and understand the discharge instructions provided?: Yes Medications obtained,verified, and reconciled?: Yes (Medications Reviewed) Any new allergies since your discharge?: Yes Dietary orders reviewed?: Yes Type of Diet Ordered:: low salt diet Do you have support at home?: Yes People in Home [RPT]: spouse Name of Support/Comfort Primary Source: Jerryl Morin  Medications Reviewed Today: Medications Reviewed Today     Reviewed by Vanetta Generous, RN (Registered Nurse) on 03/23/24 at [phone]  Med List Status: <None>   Medication Order Taking? Sig Documenting Provider Last Dose Status Informant  acetaminophen  (TYLENOL ) 325 MG tablet [id number] Yes Take 1-2 tablets (325-650 mg total) by mouth every 4 (four) hours as needed for mild pain. Setzer, Hamp Levine, PA-C  Active Self  azithromycin  (ZITHROMAX  Z-PAK) 250 MG tablet [id number]  Take 2 tablets (500 mg) PO today, then 1 tablet (250 mg) PO daily x4 days.  Patient not taking: Reported on 03/23/2024   Alison Irvine, FNP  Active   Blood Pressure Monitoring (OMRON 3 SERIES BP MONITOR) DEVI [id number] Yes As directed Lasalle Pointer, NP  Active Self  chlorpheniramine-HYDROcodone  (TUSSIONEX) 10-8 MG/5ML [id number]  Take 5 mLs by mouth every 12 (twelve) hours as needed for cough.  Patient not taking: Reported on 03/23/2024   Alison Irvine, FNP  Active Self  Cholecalciferol   (VITAMIN D ) 50 MCG (2000 UT) tablet [id number] Yes Take 2,000 Units by mouth daily.  Patient taking differently: Take 2,000 Units by mouth daily.   [provider]  Active Self  clopidogrel  (PLAVIX ) 75 MG tablet [id number] Yes Take 1 tablet (75 mg total) by mouth daily. Tobi Fortes, MD  Active Self  diltiazem  (CARDIZEM  CD) 180 MG 24 hr capsule [id number] Yes Take 1 capsule (180 mg total) by mouth daily. Tobi Fortes, MD  Active Self  diphenoxylate -atropine  (LOMOTIL ) 2.5-0.025 MG tablet [id number]  Take 1 tablet by mouth 4 (four) times daily as needed for diarrhea or loose stools.  Patient not taking: Reported on 03/23/2024   Tobi Fortes, MD  Active Self  empagliflozin  (JARDIANCE ) 10 MG TABS tablet [id number]  Take 1 tablet (10 mg total) by mouth daily.  Patient not taking: Reported on 03/17/2024   Dunn, Dayna N, PA-C  Active Self  escitalopram  (LEXAPRO ) 10 MG tablet [id number] Yes Take 1 tablet (10 mg total) by mouth daily. Liam Redhead, MD  Active Self  Fluticasone -Umeclidin-Vilant (TRELEGY ELLIPTA ) 100-62.5-25 MCG/ACT AEPB [id number] Yes Inhale 1 puff into the lungs daily. Raejean Bullock, NP  Active Self  gabapentin  (NEURONTIN ) 300 MG capsule [id number] Yes Take 1 capsule (300 mg total) by mouth at bedtime. Tobi Fortes, MD  Active Self  HYDROcodone -acetaminophen  (NORCO/VICODIN) 5-325 MG tablet [id number]  Take 1 tablet by mouth every 4 (four) hours as needed for moderate pain (pain score 4-6).  Patient not taking: Reported on 03/23/2024   Elna Haggis, MD  Active   irbesartan  (AVAPRO ) 300 MG tablet [id number]  Take 1 tablet (300 mg total) by mouth daily.  Patient not taking: Reported on 03/23/2024   Lasalle Pointer, NP  Active Self  l-methylfolate-B6-B12 [facility]) 3-35-2 MG TABS tablet [id number]  Take 1 tablet by mouth daily.  Patient not taking: Reported on 03/23/2024   Setzer, Sandra J, PA-C  Active Self  magnesium  oxide (MAG-OX) 400 MG tablet [id number]  Take 1.5 tablets  (600 mg total) by mouth at bedtime.  Patient not taking: Reported on 03/23/2024   Setzer, Sandra J, PA-C  Active Self  metformin  (FORTAMET ) 500 MG (OSM) 24 hr tablet [id number]  Take 1 tablet (500 mg total) by mouth daily with breakfast.  Patient not taking: Reported on 03/23/2024   Liam Redhead, MD  Active Self  metoprolol  succinate (TOPROL -XL) 100 MG 24 hr tablet [id number] Yes Take 1 tablet (100 mg total) by mouth daily. Take with or immediately following a meal. Dunn, Dayna N, PA-C  Active Self  nitroGLYCERIN  (NITROSTAT ) 0.4 MG SL tablet [id number]  Place 1 tablet (0.4 mg total) under the tongue every 5 (five) minutes as needed for chest pain.  Patient not taking: Reported on 03/23/2024   Dunn, Dayna N, PA-C  Active Self           Med Note Author Board, Aneta Keepers   Fri Jul 05, 2023  3:36 PM)    prochlorperazine  (COMPAZINE ) 10 MG tablet [id number] Yes Take 1 tablet (10 mg total) by mouth every 6 (six) hours as needed for nausea or vomiting. Tobi Fortes, MD  Active Self  rosuvastatin  (CRESTOR ) 40 MG tablet [id number] Yes TAKE ONE (1) TABLET BY MOUTH EVERY DAY Tobi Fortes, MD  Active Self  tamsulosin  (FLOMAX ) 0.4 MG CAPS capsule [id number]  Take 1 capsule (0.4 mg total) by mouth daily after supper.  Patient not taking: Reported on 03/23/2024   Liam Redhead, MD  Active Self  traZODone  (DESYREL ) 50 MG tablet [id number] Yes Take 1 tablet (50 mg total) by mouth at bedtime. Lincoln Renshaw, NP  Active Self            Home Care and Equipment/Supplies: Were Home Health Services Ordered?: No Any new equipment or medical supplies ordered?: No  Functional Questionnaire: Do you need assistance with bathing/showering or dressing?: No Do you need assistance with meal preparation?: No Do you need assistance with eating?: No Do you have difficulty maintaining continence: No Do you need assistance with getting out of bed/getting out of a chair/moving?: No Do you have difficulty managing or  taking your medications?: No  Follow up appointments reviewed: PCP Follow-up appointment confirmed?: No (encoruaged patient to call to make an appointment. Declines need for assistance) MD Provider Line Number:[phone] Given: No Specialist Hospital Follow-up appointment confirmed?: No Reason Specialist Follow-Up Not Confirmed: Patient has Specialist Provider Number and will Call for Appointment Do you need transportation to your follow-up appointment?: No Do you understand care options if your condition(s) worsen?: Yes-patient verbalized understanding  SDOH Interventions Today    Flowsheet Row Most Recent Value  SDOH Interventions   Food Insecurity Interventions Intervention Not Indicated  Housing Interventions Intervention Not Indicated  Transportation Interventions Intervention Not Indicated  Utilities Interventions Intervention Not Indicated   Patient reports that he is self managing well. Reports that he checks his BP 3 times per day.   Interventions: Reviewed all discharge instuctions. Encouraged patient to take his medications as prescribed. Reviewed importance of daily weights Encouraged patient to make follow up appointments with new PCP and surgeon. Reviewed signs of infection and when to call MD. Encouraged  low salt diet.  Reviewed and offered 30 day TOC program and patient declined. Provided my contact information if patient needs me to please call.   Orpha Blade, RN, BSN, CEN [employer]- Transition of Care Team.  [facility] [phone]

## 2024-03-24 MED FILL — Thrombin For Soln 5000 Unit: CUTANEOUS | Qty: 5000 | Status: AC

## 2024-03-26 ENCOUNTER — Encounter: Payer: Self-pay | Admitting: Internal Medicine

## 2024-03-26 ENCOUNTER — Ambulatory Visit: Admitting: Internal Medicine

## 2024-03-26 VITALS — BP 162/80 | HR 71 | Ht 70.0 in | Wt 196.6 lb

## 2024-03-26 DIAGNOSIS — G912 (Idiopathic) normal pressure hydrocephalus: Secondary | ICD-10-CM | POA: Diagnosis not present

## 2024-03-26 DIAGNOSIS — L03311 Cellulitis of abdominal wall: Secondary | ICD-10-CM | POA: Diagnosis not present

## 2024-03-26 DIAGNOSIS — R42 Dizziness and giddiness: Secondary | ICD-10-CM

## 2024-03-26 DIAGNOSIS — I10 Essential (primary) hypertension: Secondary | ICD-10-CM

## 2024-03-26 DIAGNOSIS — Z09 Encounter for follow-up examination after completed treatment for conditions other than malignant neoplasm: Secondary | ICD-10-CM

## 2024-03-26 MED ORDER — MECLIZINE HCL 12.5 MG PO TABS: 12.5000 mg | ORAL_TABLET | Freq: Two times a day (BID) | ORAL | 0 refills | Status: AC | PRN

## 2024-03-26 MED ORDER — SULFAMETHOXAZOLE-TRIMETHOPRIM 800-160 MG PO TABS
1.0000 | ORAL_TABLET | Freq: Two times a day (BID) | ORAL | 0 refills | Status: DC
Start: 2024-03-26 — End: 2024-05-01

## 2024-03-26 MED ORDER — DILTIAZEM HCL ER COATED BEADS 240 MG PO CP24: 240.0000 mg | ORAL_CAPSULE | Freq: Every day | ORAL | 1 refills | Status: DC

## 2024-03-26 NOTE — Assessment & Plan Note (Addendum)
 Erythema and mild soreness over port site, started empiric Bactrim for cellulitis Keep area clean and dry

## 2024-03-26 NOTE — Addendum Note (Signed)
 Addended byCleola Dach on: 03/26/2024 02:35 PM   Modules accepted: Level of Service

## 2024-03-26 NOTE — Assessment & Plan Note (Signed)
 Hospital chart reviewed, including discharge summary Medications reconciled and reviewed with the patient in detail

## 2024-03-26 NOTE — Progress Notes (Deleted)
 Pt rescheduled appt                                                                                                                                                                                                                                                                                                                                                                                                             Cardiology Office Note   Date:  03/26/2024   ID:  TIP [name], DOB 26-Oct-1950, MRN [id number]  PCP:  Bevely Doffing, FNP  Cardiologist:   Vina Gull, MD    Patient presents for f/u of CAD    History of Present Illness: Jeremy Lopez is a 73 y.o. male with a history of  CAD   He is s/p PTCA/DES in 2010, PTCA/DES to m RCA in July 2021.  He also has a hx of HTN, HL, PFpEF, CKD, remote lung CA (rx chemo/xrt), EtOH, remote tobacco   In June 2021 he complained of chest pressure   Myovue was done and showed no ischemia   The pt continued to have CP, giving out with activity, SOB   CT of chest showed radiation fibrosis    With continued worsened symptoms,  he went on to [facility] in July 2021  LHC showed 99% mid RCA lesion  PT  underwent atherectomy with DES placment.   The pt also had a patent Prox LAD stent.   LVEF 55 to 65%  In Sept 2023 had increased DOE    Echo showed LVEF 60 to 65%, G1  DD, normal RV  LHC showed nonobstrucitve CAD, patent stents.   LVEDP mildly elevated.    Dilt switched to Entresto .   Pt also started on jardiance   Seen in pulmonary  CT showed stable emphysema.    Seen by JONETTA Bring in October  2023   Switched back to diltiazem     Since seen he denies CP  Breathing is OK   No dizziness    The pt resumed Dilt 240 just a few wks ago   BP still elevated    SBP at home 160s/    Has been taking Nyquil    Not on plavix          The pt was hospitalized in October for CVA   I saw the pt in Dec 2024   He was seen by FORBES Crate in [name] 2025  No outpatient medications have been marked as taking for the 03/27/24 encounter (Appointment) with [name], MD.     Allergies:   Cephalexin and Amlodipine    Past Medical History:  Diagnosis Date   CAD (coronary artery disease), native coronary artery    DES proximal LAD 07/2009; DES to RCA 04/2020   Chronic bronchitis (HCC)    Chronic heart failure with preserved ejection fraction (HFpEF) (HCC)    Essential hypertension    Habitual alcohol use    pt has not drank alcohol since stroke in 06/2023   History of kidney stones    Hyperlipidemia    Ocular migraine    PVC's (premature ventricular contractions)    Small cell lung cancer (HCC)    Right upper lobe 2008; no evidence of disease since treatment ended in 11/2007   Stroke (HCC) 07/05/2023    Past Surgical History:  Procedure Laterality Date   CORONARY ATHERECTOMY N/A 04/26/2020   Procedure: CORONARY ATHERECTOMY;  Surgeon: Dann Candyce RAMAN, MD;  Location: [facility] INVASIVE CV LAB;  Service: Cardiovascular;  Laterality: N/A;   CORONARY STENT INTERVENTION N/A 04/26/2020   Procedure: CORONARY STENT INTERVENTION;  Surgeon: Dann Candyce RAMAN, MD;  Location: [facility] INVASIVE CV LAB;  Service: Cardiovascular;  Laterality: N/A;   CORONARY ULTRASOUND/IVUS N/A 04/26/2020   Procedure: Intravascular Ultrasound/IVUS;  Surgeon: Dann Candyce RAMAN, MD;  Location: [facility] INVASIVE CV LAB;  Service: Cardiovascular;  Laterality: N/A;   Insertion of left subclavian Port-A-Cath  2008   LAPAROSCOPY N/A 03/20/2024   Procedure: LAPAROSCOPY, DIAGNOSTIC;  Surgeon: Dasie Leonor CROME, MD;  Location: [facility] OR;  Service: General;   Laterality: N/A;   LEFT HEART CATH AND CORONARY ANGIOGRAPHY N/A 07/03/2022   Procedure: LEFT HEART CATH AND CORONARY ANGIOGRAPHY;  Surgeon: Swaziland, Peter M, MD;  Location: [facility] INVASIVE CV LAB;  Service: Cardiovascular;  Laterality: N/A;   RIGHT/LEFT HEART CATH AND CORONARY ANGIOGRAPHY N/A 04/26/2020   Procedure: RIGHT/LEFT HEART CATH AND CORONARY ANGIOGRAPHY;  Surgeon: Dann Candyce RAMAN, MD;  Location: [facility] INVASIVE CV LAB;  Service: Cardiovascular;  Laterality: N/A;   TEMPORARY PACEMAKER N/A 04/26/2020   Procedure: TEMPORARY PACEMAKER;  Surgeon: Dann Candyce RAMAN, MD;  Location: [facility] INVASIVE CV LAB;  Service: Cardiovascular;  Laterality: N/A;   VENTRICULOPERITONEAL SHUNT N/A 03/20/2024   Procedure: SHUNT INSERTION VENTRICULAR-PERITONEAL;  Surgeon: Lanis Pupa, MD;  Location: [facility] OR;  Service: Neurosurgery;  Laterality: N/A;  LAPAROSCOPIC ASSISTED VP SHUNT PLACEMENT (DOW CO-SURGEON)   Video bronchoscopy and video mediastinoscopy  2008     Social History:  The patient  reports that he quit smoking about 25 years ago. His smoking use included cigarettes. He  started smoking about 57 years ago. He has been exposed to tobacco smoke. He has never used smokeless tobacco. He reports that he does not currently use alcohol. He reports that he does not currently use drugs.   Family History:  The patient's family history includes Alcohol abuse in his maternal uncle; CAD in his brother; Cancer in his father; Depression in his maternal uncle; Hypertension in his brother.    ROS:  Please see the history of present illness. All other systems are reviewed and  Negative to the above problem except as noted.    PHYSICAL EXAM: VS:  There were no vitals taken for this visit.  GEN: Obese 73 yo  in no acute distress  HEENT: normal  Neck: no JVD, no carotid bruit Cardiac: RRR; no murmurs;  No signif LE edema  Respiratory:  CTA GI: soft, nontender, obese    MS: no deformity Moving all extremities   Skin:  warm and dry,  Sl erythema Neuro:  Strength and sensation are intact Psych: euthymic mood, full affect   EKG:  EKG is not done today    LHC   Sept 2023    Mid RCA to Dist RCA lesion is 40% stenosed.   Mid RCA lesion is 30% stenosed.   Non-stenotic Prox LAD lesion was previously treated.   LV end diastolic pressure is mildly elevated.   Nonobstructive CAD. Prior stents in the LAD and RCA are patent Mildly elevated LVEDP 17 mm Hg   Echo 2023   1. Left ventricular ejection fraction, by estimation, is 60 to 65%. The  left ventricle has normal function. The left ventricle has no regional  wall motion abnormalities. Left ventricular diastolic parameters are  consistent with Grade I diastolic  dysfunction (impaired relaxation). The average left ventricular global  longitudinal strain is -17.7 %. The global longitudinal strain is normal.   2. Right ventricular systolic function is normal. The right ventricular  size is normal. Tricuspid regurgitation signal is inadequate for assessing  PA pressure.   3. The mitral valve is grossly normal, mildly calcified. Trivial mitral  valve regurgitation.   4. The aortic valve is tricuspid. Aortic valve regurgitation is not  visualized.   5. The inferior vena cava is normal in size with greater than 50%  respiratory variability, suggesting right atrial pressure of 3 mmHg.    CARDIAC CATH   04/26/20   Mid RCA lesion is 99% stenosed, heavily calcified. Following atherectomy, A drug-eluting stent was successfully placed using a STENT RESOLUTE ONYX 3.5X15, postdilated to 3.75 mm; stent optimized with IVUS. Post intervention, there is a 0% residual stenosis. Mid RCA to Dist RCA lesion is 40% stenosed. Previously placed Prox LAD stent, is widely patent. The left ventricular systolic function is normal. LV end diastolic pressure is normal. The left ventricular ejection fraction is 55-65% by visual estimate. There is no aortic valve stenosis.    Continue aggressive secondary prevention.  Still eligible for same-day discharge.   Consider Plavix  monotherapy given his diffuse disease after DAPT is completed.    Chest CT  04/21/20   No pulmonary emboli.   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   Aortic Atherosclerosis (ICD10-I70.0). Coronary artery calcification.  Lipid Panel    Component Value Date/Time   CHOL 98 07/06/2023 0602   CHOL 162 01/18/2023 1053   TRIG 109 07/06/2023 0602   HDL 42 07/06/2023 0602   HDL 54 01/18/2023 1053   CHOLHDL 2.3 07/06/2023 0602  VLDL 22 07/06/2023 0602   LDLCALC 34 07/06/2023 0602   LDLCALC 76 01/18/2023 1053   LDLCALC 61 12/28/2019 1501      Wt Readings from Last 3 Encounters:  03/20/24 197 lb (89.4 kg)  03/17/24 197 lb 11.2 oz (89.7 kg)  03/11/24 199 lb (90.3 kg)      ASSESSMENT AND PLAN:  1.  CAD   LHC in Sept 2023 showed no obstructive CAD   Resume Plavix   2  HTN     3  HL     4   Metabolics       Current medicines are reviewed at length with the patient today.  The patient does not have concerns regarding medicines.  Signed, Vina Gull, MD  03/26/2024 10:29 AM     [facility] [address] [place], [place], [place]  [address] Phone: [phone]; Fax: [phone]

## 2024-03-26 NOTE — Assessment & Plan Note (Signed)
 BP Readings from Last 1 Encounters:  03/26/24 (!) 162/80   Uncontrolled with irbesartan  300 mg QD metoprolol  100 mg QD and diltiazem  180 mg QD Used to be on diltiazem  240 mg QD, increased dose to 240 mg once daily If persistently uncontrolled BP, consider adding hydralazine  Counseled for compliance with the medications Advised DASH diet and moderate exercise/walking, at least 150 mins/week

## 2024-03-26 NOTE — Patient Instructions (Addendum)
 Please start taking Diltiazem  240 mg once daily instead of 180 mg.  Please take Bactrim as prescribed for skin infection.  Please keep area clean and dry.  Please try taking Meclizine as needed for dizziness. Please discuss with Neurosurgeon about dizziness as well.

## 2024-03-26 NOTE — Assessment & Plan Note (Signed)
 S/p VP shunt placement Followed by neurosurgery Slight dizziness could be due to recent procedure, may need reprogramming of the shunt on follow-up visit

## 2024-03-26 NOTE — Assessment & Plan Note (Signed)
 Likely due to recent VP shunt procedure, may need reprogramming of shunt, followed by neurosurgery Meclizine as needed for dizziness Advised to maintain adequate hydration and eat at regular intervals

## 2024-03-26 NOTE — Progress Notes (Signed)
 Established Patient Office Visit  Subjective:  Patient ID: Jeremy Lopez, male    DOB: 1951/04/04  Age: 73 y.o. MRN: [id number]  CC:  Chief Complaint  Patient presents with   Follow-up    Hospital f/u , reports sx of dizziness from surgery.     HPI Willaim D Lopez is a 73 y.o. male with past medical history of NPH who presents for follow-up after recent hospitalization.  He underwent VP shunt placement on 03/20/24. Tolerated the procedure well.  He has mild dizziness since the procedure, but has overall improved compared to prior.  Denies any nausea or vomiting.  Denies visual disturbance.  Urinary incontinence has improved now.  He has follow-up with neurosurgeon on 04/02/24.  He reports redness and mild soreness on the left lower abdomen over the port site.  Denies any fever or chills.  His BP was elevated today.  He currently takes irbesartan  300 mg QD, metoprolol  100 mg QD and diltiazem  180 mg QD.  He reports fluctuating BP at home, ranging from 180/80 to 124/66.  Denies chest pain, dyspnea or palpitations.  Had CVA in 09/24, since when he has had uncontrolled HTN.  Past Medical History:  Diagnosis Date   CAD (coronary artery disease), native coronary artery    DES proximal LAD 07/2009; DES to RCA 04/2020   Chronic bronchitis (HCC)    Chronic heart failure with preserved ejection fraction (HFpEF) (HCC)    Essential hypertension    Habitual alcohol use    pt has not drank alcohol since stroke in 06/2023   History of kidney stones    Hyperlipidemia    Ocular migraine    PVC's (premature ventricular contractions)    Small cell lung cancer (HCC)    Right upper lobe 2008; no evidence of disease since treatment ended in 11/2007   Stroke (HCC) 07/05/2023    Past Surgical History:  Procedure Laterality Date   CORONARY ATHERECTOMY N/A 04/26/2020   Procedure: CORONARY ATHERECTOMY;  Surgeon: Lucendia Rusk, MD;  Location: [facility] INVASIVE CV LAB;  Service: Cardiovascular;  Laterality:  N/A;   CORONARY STENT INTERVENTION N/A 04/26/2020   Procedure: CORONARY STENT INTERVENTION;  Surgeon: Lucendia Rusk, MD;  Location: [facility] INVASIVE CV LAB;  Service: Cardiovascular;  Laterality: N/A;   CORONARY ULTRASOUND/IVUS N/A 04/26/2020   Procedure: Intravascular Ultrasound/IVUS;  Surgeon: Lucendia Rusk, MD;  Location: [facility] INVASIVE CV LAB;  Service: Cardiovascular;  Laterality: N/A;   Insertion of left subclavian Port-A-Cath  2008   LAPAROSCOPY N/A 03/20/2024   Procedure: LAPAROSCOPY, DIAGNOSTIC;  Surgeon: Lujean Sake, MD;  Location: [facility] OR;  Service: General;  Laterality: N/A;   LEFT HEART CATH AND CORONARY ANGIOGRAPHY N/A 07/03/2022   Procedure: LEFT HEART CATH AND CORONARY ANGIOGRAPHY;  Surgeon: Swaziland, Peter M, MD;  Location: [facility] INVASIVE CV LAB;  Service: Cardiovascular;  Laterality: N/A;   RIGHT/LEFT HEART CATH AND CORONARY ANGIOGRAPHY N/A 04/26/2020   Procedure: RIGHT/LEFT HEART CATH AND CORONARY ANGIOGRAPHY;  Surgeon: Lucendia Rusk, MD;  Location: [facility] INVASIVE CV LAB;  Service: Cardiovascular;  Laterality: N/A;   TEMPORARY PACEMAKER N/A 04/26/2020   Procedure: TEMPORARY PACEMAKER;  Surgeon: Lucendia Rusk, MD;  Location: [facility] INVASIVE CV LAB;  Service: Cardiovascular;  Laterality: N/A;   VENTRICULOPERITONEAL SHUNT N/A 03/20/2024   Procedure: SHUNT INSERTION VENTRICULAR-PERITONEAL;  Surgeon: Augusto Blonder, MD;  Location: [facility] OR;  Service: Neurosurgery;  Laterality: N/A;  LAPAROSCOPIC ASSISTED VP SHUNT PLACEMENT (DOW CO-SURGEON)   Video bronchoscopy and video mediastinoscopy  2008    Family History  Problem Relation Age of Onset   Cancer Father    CAD Brother    Hypertension Brother    Depression Maternal Uncle    Alcohol abuse Maternal Uncle    Stroke Neg Hx     Social History   Socioeconomic History   Marital status: Married    Spouse name: [name]   Number of children: 2   Years of education: 14   Highest education level: Tax adviser degree: occupational,  Scientist, product/process development, or vocational program  Occupational History   Occupation: Full time    Comment: Industrial/product designer for [employer]   Occupation: Retired Industrial/product designer    Comment: [employer]  Tobacco Use   Smoking status: Former    Current packs/day: 0.00    Types: Cigarettes    Start date: 10/25/1966    Quit date: 10/25/1998    Years since quitting: 25.4    Passive exposure: Past   Smokeless tobacco: Never  Vaping Use   Vaping status: Never Used  Substance and Sexual Activity   Alcohol use: Not Currently   Drug use: Not Currently   Sexual activity: Yes  Other Topics Concern   Not on file  Social History Narrative   Lives in [place] with wife. Enjoys reading, playing golf in free time.    Pt retired    Teacher, early years/pre Strain: Low Risk  (10/28/2023)   Overall Financial Resource Strain (CARDIA)    Difficulty of Paying Living Expenses: Not very hard  Food Insecurity: No Food Insecurity (03/23/2024)   Hunger Vital Sign    Worried About Running Out of Food in the Last Year: Never true    Ran Out of Food in the Last Year: Never true  Transportation Needs: No Transportation Needs (03/23/2024)   PRAPARE - Administrator, Civil Service (Medical): No    Lack of Transportation (Non-Medical): No  Physical Activity: Insufficiently Active (10/28/2023)   Exercise Vital Sign    Days of Exercise per Week: 1 day    Minutes of Exercise per Session: 10 min  Stress: No Stress Concern Present (10/28/2023)   [employer] of Occupational Health - Occupational Stress Questionnaire    Feeling of Stress : Only a little  Social Connections: Moderately Integrated (03/20/2024)   Social Connection and Isolation Panel    Frequency of Communication with Friends and Family: Twice a week    Frequency of Social Gatherings with Friends and Family: Once a week    Attends Religious Services: Patient declined    Database administrator or Organizations: Yes    Attends  Engineer, structural: More than 4 times per year    Marital Status: Married  Catering manager Violence: Not At Risk (03/23/2024)   Humiliation, Afraid, Rape, and Kick questionnaire    Fear of Current or Ex-Partner: No    Emotionally Abused: No    Physically Abused: No    Sexually Abused: No    Outpatient Medications Prior to Visit  Medication Sig Dispense Refill   acetaminophen  (TYLENOL ) 325 MG tablet Take 1-2 tablets (325-650 mg total) by mouth every 4 (four) hours as needed for mild pain.     azithromycin  (ZITHROMAX  Z-PAK) 250 MG tablet Take 2 tablets (500 mg) PO today, then 1 tablet (250 mg) PO daily x4 days. 6 tablet 0   Blood Pressure Monitoring (OMRON 3 SERIES BP MONITOR) DEVI As directed 1 each 1   chlorpheniramine-HYDROcodone  (TUSSIONEX)  10-8 MG/5ML Take 5 mLs by mouth every 12 (twelve) hours as needed for cough. 115 mL 0   Cholecalciferol  (VITAMIN D ) 50 MCG (2000 UT) tablet Take 2,000 Units by mouth daily. (Patient taking differently: Take 2,000 Units by mouth daily.)     clopidogrel  (PLAVIX ) 75 MG tablet Take 1 tablet (75 mg total) by mouth daily. 90 tablet 1   diphenoxylate -atropine  (LOMOTIL ) 2.5-0.025 MG tablet Take 1 tablet by mouth 4 (four) times daily as needed for diarrhea or loose stools. 30 tablet 0   empagliflozin  (JARDIANCE ) 10 MG TABS tablet Take 1 tablet (10 mg total) by mouth daily. 30 tablet 6   escitalopram  (LEXAPRO ) 10 MG tablet Take 1 tablet (10 mg total) by mouth daily. 30 tablet 1   Fluticasone -Umeclidin-Vilant (TRELEGY ELLIPTA ) 100-62.5-25 MCG/ACT AEPB Inhale 1 puff into the lungs daily. 1 each 10   gabapentin  (NEURONTIN ) 300 MG capsule Take 1 capsule (300 mg total) by mouth at bedtime. 90 capsule 3   HYDROcodone -acetaminophen  (NORCO/VICODIN) 5-325 MG tablet Take 1 tablet by mouth every 4 (four) hours as needed for moderate pain (pain score 4-6). 20 tablet 0   irbesartan  (AVAPRO ) 300 MG tablet Take 1 tablet (300 mg total) by mouth daily. 90 tablet 1    l-methylfolate-B6-B12 (METANX) 3-35-2 MG TABS tablet Take 1 tablet by mouth daily. 30 tablet 0   magnesium  oxide (MAG-OX) 400 MG tablet Take 1.5 tablets (600 mg total) by mouth at bedtime. 30 tablet 0   metformin  (FORTAMET ) 500 MG (OSM) 24 hr tablet Take 1 tablet (500 mg total) by mouth daily with breakfast. 90 tablet 3   metoprolol  succinate (TOPROL -XL) 100 MG 24 hr tablet Take 1 tablet (100 mg total) by mouth daily. Take with or immediately following a meal. 90 tablet 3   nitroGLYCERIN  (NITROSTAT ) 0.4 MG SL tablet Place 1 tablet (0.4 mg total) under the tongue every 5 (five) minutes as needed for chest pain. 25 tablet 2   prochlorperazine  (COMPAZINE ) 10 MG tablet Take 1 tablet (10 mg total) by mouth every 6 (six) hours as needed for nausea or vomiting. 30 tablet 0   rosuvastatin  (CRESTOR ) 40 MG tablet TAKE ONE (1) TABLET BY MOUTH EVERY DAY 90 tablet 3   tamsulosin  (FLOMAX ) 0.4 MG CAPS capsule Take 1 capsule (0.4 mg total) by mouth daily after supper. 90 capsule 3   traZODone  (DESYREL ) 50 MG tablet Take 1 tablet (50 mg total) by mouth at bedtime. 90 tablet 1   diltiazem  (CARDIZEM  CD) 180 MG 24 hr capsule Take 1 capsule (180 mg total) by mouth daily. 90 capsule 3   No facility-administered medications prior to visit.    Allergies  Allergen Reactions   Cephalexin Anaphylaxis    throat swelling Patient said he can take amoxicillin   Amlodipine  Swelling    Leg swelling    ROS Review of Systems  Constitutional:  Negative for chills and fever.  HENT:  Negative for congestion and sore throat.   Eyes:  Negative for pain and discharge.  Respiratory:  Negative for cough and shortness of breath.   Cardiovascular:  Negative for chest pain and palpitations.  Gastrointestinal:  Negative for diarrhea, nausea and vomiting.  Endocrine: Negative for polydipsia and polyuria.  Genitourinary:  Negative for dysuria and hematuria.  Musculoskeletal:  Negative for neck pain and neck stiffness.  Skin:   Negative for rash.  Neurological:  Positive for dizziness. Negative for weakness.  Psychiatric/Behavioral:  Negative for agitation and behavioral problems.  Objective:    Physical Exam Vitals reviewed.  Constitutional:      General: He is not in acute distress.    Appearance: He is not diaphoretic.  HENT:     Head: Normocephalic and atraumatic.     Nose: Nose normal.     Mouth/Throat:     Mouth: Mucous membranes are moist.   Eyes:     General: No scleral icterus.    Extraocular Movements: Extraocular movements intact.    Cardiovascular:     Rate and Rhythm: Normal rate and regular rhythm.     Heart sounds: Normal heart sounds. No murmur heard. Pulmonary:     Breath sounds: Normal breath sounds. No wheezing or rales.   Musculoskeletal:     Cervical back: Neck supple. No tenderness.     Right lower leg: No edema.     Left lower leg: No edema.   Skin:    General: Skin is warm.     Findings: Erythema (Over left lower abdominal wall - about 2 cm in diameter, around port site) present. No rash.   Neurological:     General: No focal deficit present.     Mental Status: He is alert and oriented to person, place, and time.     Sensory: No sensory deficit.     Motor: No weakness.   Psychiatric:        Mood and Affect: Mood normal.        Behavior: Behavior normal.     BP (!) 162/80 (BP Location: Left Arm)   Pulse 71   Ht 5' 10 (1.778 m)   Wt 196 lb 9.6 oz (89.2 kg)   SpO2 97%   BMI 28.21 kg/m  Wt Readings from Last 3 Encounters:  03/26/24 196 lb 9.6 oz (89.2 kg)  03/20/24 197 lb (89.4 kg)  03/17/24 197 lb 11.2 oz (89.7 kg)    Lab Results  Component Value Date   TSH 2.590 01/18/2023   Lab Results  Component Value Date   WBC 10.5 03/17/2024   HGB 14.6 03/17/2024   HCT 45.0 03/17/2024   MCV 90.9 03/17/2024   PLT 443 (H) 03/17/2024   Lab Results  Component Value Date   NA 136 03/17/2024   K 4.3 03/17/2024   CO2 26 03/17/2024   GLUCOSE 99  03/17/2024   BUN 17 03/17/2024   CREATININE 1.12 03/17/2024   BILITOT 0.6 07/06/2023   ALKPHOS 61 07/06/2023   AST 11 (L) 07/06/2023   ALT 13 07/06/2023   PROT 6.0 (L) 07/06/2023   ALBUMIN 3.3 (L) 07/06/2023   CALCIUM  9.4 03/17/2024   ANIONGAP 8 03/17/2024   EGFR 66 01/18/2023   Lab Results  Component Value Date   CHOL 98 07/06/2023   Lab Results  Component Value Date   HDL 42 07/06/2023   Lab Results  Component Value Date   LDLCALC 34 07/06/2023   Lab Results  Component Value Date   TRIG 109 07/06/2023   Lab Results  Component Value Date   CHOLHDL 2.3 07/06/2023   Lab Results  Component Value Date   HGBA1C 5.7 (H) 07/05/2023      Assessment & Plan:   Problem List Items Addressed This Visit       Cardiovascular and Mediastinum   Essential hypertension   BP Readings from Last 1 Encounters:  03/26/24 (!) 162/80   Uncontrolled with irbesartan  300 mg QD metoprolol  100 mg QD and diltiazem  180 mg QD Used to be on diltiazem   240 mg QD, increased dose to 240 mg once daily If persistently uncontrolled BP, consider adding hydralazine  Counseled for compliance with the medications Advised DASH diet and moderate exercise/walking, at least 150 mins/week       Relevant Medications   diltiazem  (CARDIZEM  CD) 240 MG 24 hr capsule     Nervous and Auditory   NPH (normal pressure hydrocephalus) (HCC) - Primary (Chronic)   S/p VP shunt placement Followed by neurosurgery Slight dizziness could be due to recent procedure, may need reprogramming of the shunt on follow-up visit        Other   Dizziness   Likely due to recent VP shunt procedure, may need reprogramming of shunt, followed by neurosurgery Meclizine as needed for dizziness Advised to maintain adequate hydration and eat at regular intervals      Relevant Medications   meclizine (ANTIVERT) 12.5 MG tablet   Hospital discharge follow-up   Hospital chart reviewed, including discharge summary Medications  reconciled and reviewed with the patient in detail      Cellulitis of abdominal wall   Erythema and mild soreness over port site, started empiric Bactrim for cellulitis Keep area clean and dry      Relevant Medications   sulfamethoxazole-trimethoprim (BACTRIM DS) 800-160 MG tablet    Meds ordered this encounter  Medications   diltiazem  (CARDIZEM  CD) 240 MG 24 hr capsule    Sig: Take 1 capsule (240 mg total) by mouth daily.    Dispense:  90 capsule    Refill:  1   sulfamethoxazole-trimethoprim (BACTRIM DS) 800-160 MG tablet    Sig: Take 1 tablet by mouth 2 (two) times daily.    Dispense:  10 tablet    Refill:  0   meclizine (ANTIVERT) 12.5 MG tablet    Sig: Take 1 tablet (12.5 mg total) by mouth 2 (two) times daily as needed for dizziness.    Dispense:  30 tablet    Refill:  0    Follow-up: Return if symptoms worsen or fail to improve.    Meldon Sport, MD

## 2024-03-27 ENCOUNTER — Ambulatory Visit: Payer: HMO | Admitting: Internal Medicine

## 2024-04-30 ENCOUNTER — Ambulatory Visit: Payer: HMO

## 2024-05-01 ENCOUNTER — Ambulatory Visit

## 2024-05-01 VITALS — BP 140/74 | HR 71 | Ht 70.0 in | Wt 200.0 lb

## 2024-05-01 DIAGNOSIS — I10 Essential (primary) hypertension: Secondary | ICD-10-CM | POA: Diagnosis not present

## 2024-05-01 NOTE — Progress Notes (Signed)
 Established Patient Office Visit  Subjective   Patient ID: Jeremy Lopez, male    DOB: [birth year]  Age: 73 y.o. MRN: [id number]  Chief Complaint  Patient presents with   Medical Management of Chronic Issues    6 month follow up    HPI  Patient Active Problem List   Diagnosis Date Noted   Hospital discharge follow-up 03/26/2024   Cellulitis of abdominal wall 03/26/2024   Normal pressure hydrocephalus (HCC) 03/20/2024   NPH (normal pressure hydrocephalus) (HCC) 03/11/2024   Abnormality of gait 03/11/2024   Diarrhea 11/01/2023   Acute bacterial bronchitis 09/24/2023   Acute CVA (cerebrovascular accident) (HCC) 07/13/2023   Dizziness 07/12/2023   Cerebellar stroke, acute (HCC) 07/07/2023   CVA (cerebral vascular accident) (HCC) 07/05/2023   Headache 07/05/2023   Prediabetes 02/01/2023   CKD (chronic kidney disease) stage 2, GFR 60-89 ml/min 02/01/2023   Nausea 02/01/2023   Colon cancer screening 02/01/2023   CHF (congestive heart failure) (HCC) 01/23/2023   Numbness and tingling of both lower extremities 01/23/2023   Encounter for general adult medical examination with abnormal findings 01/23/2023   Allergic rhinitis 01/23/2022   Asthmatic bronchitis, mild persistent, uncomplicated 01/23/2022   Upper airway cough syndrome 01/23/2022   Simple chronic bronchitis (HCC) 12/28/2021   Seasonal allergies 12/28/2021   DOE (dyspnea on exertion)    Nosebleed 10/25/2014   Lung cancer (HCC) 02/05/2010   CAD (coronary artery disease), native coronary artery 02/05/2010   Mixed hyperlipidemia 08/16/2009   Essential hypertension 07/08/2009      ROS    Objective:     BP (!) 140/74 (BP Location: Left Arm, Patient Position: Sitting, Cuff Size: Normal)   Pulse 71   Ht 5' 10 (1.778 m)   Wt 200 lb (90.7 kg)   SpO2 93%   BMI 28.70 kg/m  BP Readings from Last 3 Encounters:  05/01/24 (!) 140/74  03/26/24 (!) 162/80  03/23/24 (!) 144/78   Wt Readings from Last 3 Encounters:   05/01/24 200 lb (90.7 kg)  03/26/24 196 lb 9.6 oz (89.2 kg)  03/20/24 197 lb (89.4 kg)      Physical Exam Vitals and nursing note reviewed.  Constitutional:      Appearance: Normal appearance.  HENT:     Head: Normocephalic.   Eyes:     Extraocular Movements: Extraocular movements intact.     Pupils: Pupils are equal, round, and reactive to light.  Cardiovascular:     Rate and Rhythm: Normal rate and regular rhythm.  Pulmonary:     Effort: Pulmonary effort is normal.     Breath sounds: Normal breath sounds.  Musculoskeletal:     Cervical back: Normal range of motion and neck supple.  Neurological:     Mental Status: He is alert and oriented to person, place, and time.  Psychiatric:        Mood and Affect: Mood normal.        Thought Content: Thought content normal.     No results found for any visits on 05/01/24.    The ASCVD Risk score (Arnett DK, et al., 2019) failed to calculate for the following reasons:   Risk score cannot be calculated because patient has a medical history suggesting prior/existing ASCVD    Assessment & Plan:   Problem List Items Addressed This Visit       Cardiovascular and Mediastinum   Essential hypertension - Primary   BP Readings from Last 1 Encounters:  05/01/24 (!) 140/74  Fair control with irbesartan  300 mg QD metoprolol  100 mg QD and diltiazem  180 mg QD Counseled for compliance with the medications Advised DASH diet and moderate exercise/walking, at least 150 mins/week        Return in about 6 months (around 11/01/2024) for chronic follow-up with PCP.    Leita Longs, FNP

## 2024-05-11 NOTE — Assessment & Plan Note (Signed)
 BP Readings from Last 1 Encounters:  05/01/24 (!) 140/74   Fair control with irbesartan  300 mg QD metoprolol  100 mg QD and diltiazem  180 mg QD Counseled for compliance with the medications Advised DASH diet and moderate exercise/walking, at least 150 mins/week

## 2024-05-13 ENCOUNTER — Other Ambulatory Visit: Payer: Self-pay | Admitting: Internal Medicine

## 2024-06-15 NOTE — Progress Notes (Signed)
 canceled

## 2024-06-19 ENCOUNTER — Encounter: Admitting: Physical Medicine and Rehabilitation

## 2024-06-22 ENCOUNTER — Telehealth (INDEPENDENT_AMBULATORY_CARE_PROVIDER_SITE_OTHER): Admitting: Behavioral Health

## 2024-06-22 ENCOUNTER — Encounter: Payer: Self-pay | Admitting: Behavioral Health

## 2024-06-22 DIAGNOSIS — G4709 Other insomnia: Secondary | ICD-10-CM | POA: Diagnosis not present

## 2024-06-22 MED ORDER — MIRTAZAPINE 15 MG PO TABS: 15.0000 mg | ORAL_TABLET | Freq: Every day | ORAL | 1 refills | Status: DC

## 2024-06-22 NOTE — Progress Notes (Deleted)
 [facility]  Patient ID: Jeremy Lopez,  MRN: [id number]  PCP: Bevely Doffing, FNP  Date of Evaluation: 06/22/2024 Time spent:{TIME; 0 MIN TO 60 MIN:[phone]}  Chief Complaint:   HISTORY/CURRENT STATUS: HPI  Individual Medical History/ Review of Systems: Changes? :{EXAM; YES/NO:21197}  Allergies: Cephalexin and Amlodipine   Current Medications:  Current Outpatient Medications:    acetaminophen  (TYLENOL ) 325 MG tablet, Take 1-2 tablets (325-650 mg total) by mouth every 4 (four) hours as needed for mild pain., Disp: , Rfl:    Blood Pressure Monitoring (OMRON 3 SERIES BP MONITOR) DEVI, As directed, Disp: 1 each, Rfl: 1   chlorpheniramine-HYDROcodone  (TUSSIONEX) 10-8 MG/5ML, Take 5 mLs by mouth every 12 (twelve) hours as needed for cough., Disp: 115 mL, Rfl: 0   Cholecalciferol  (VITAMIN D ) 50 MCG (2000 UT) tablet, Take 2,000 Units by mouth daily. (Patient taking differently: Take 2,000 Units by mouth daily.), Disp: , Rfl:    clopidogrel  (PLAVIX ) 75 MG tablet, Take 1 tablet (75 mg total) by mouth daily., Disp: 90 tablet, Rfl: 1   diltiazem  (CARDIZEM  CD) 240 MG 24 hr capsule, Take 1 capsule (240 mg total) by mouth daily., Disp: 90 capsule, Rfl: 1   diphenoxylate -atropine  (LOMOTIL ) 2.5-0.025 MG tablet, Take 1 tablet by mouth 4 (four) times daily as needed for diarrhea or loose stools., Disp: 30 tablet, Rfl: 0   escitalopram  (LEXAPRO ) 10 MG tablet, Take 1 tablet (10 mg total) by mouth daily., Disp: 30 tablet, Rfl: 1   Fluticasone -Umeclidin-Vilant (TRELEGY ELLIPTA ) 100-62.5-25 MCG/ACT AEPB, Inhale 1 puff into the lungs daily., Disp: 1 each, Rfl: 10   HYDROcodone -acetaminophen  (NORCO/VICODIN) 5-325 MG tablet, Take 1 tablet by mouth every 4 (four) hours as needed for moderate pain (pain score 4-6)., Disp: 20 tablet, Rfl: 0   irbesartan  (AVAPRO ) 300 MG tablet, Take 1 tablet (300 mg total) by mouth daily., Disp: 90 tablet, Rfl: 1   l-methylfolate-B6-B12 (METANX) 3-35-2 MG TABS  tablet, Take 1 tablet by mouth daily., Disp: 30 tablet, Rfl: 0   magnesium  oxide (MAG-OX) 400 MG tablet, Take 1.5 tablets (600 mg total) by mouth at bedtime., Disp: 30 tablet, Rfl: 0   meclizine  (ANTIVERT ) 12.5 MG tablet, Take 1 tablet (12.5 mg total) by mouth 2 (two) times daily as needed for dizziness., Disp: 30 tablet, Rfl: 0   metoprolol  succinate (TOPROL -XL) 100 MG 24 hr tablet, Take 1 tablet (100 mg total) by mouth daily. Take with or immediately following a meal., Disp: 90 tablet, Rfl: 3   nitroGLYCERIN  (NITROSTAT ) 0.4 MG SL tablet, Place 1 tablet (0.4 mg total) under the tongue every 5 (five) minutes as needed for chest pain., Disp: 25 tablet, Rfl: 2   prochlorperazine  (COMPAZINE ) 10 MG tablet, Take 1 tablet (10 mg total) by mouth every 6 (six) hours as needed for nausea or vomiting., Disp: 30 tablet, Rfl: 0   rosuvastatin  (CRESTOR ) 40 MG tablet, TAKE ONE (1) TABLET BY MOUTH EVERY DAY, Disp: 90 tablet, Rfl: 3   tamsulosin  (FLOMAX ) 0.4 MG CAPS capsule, Take 1 capsule (0.4 mg total) by mouth daily after supper., Disp: 90 capsule, Rfl: 3 Medication Side Effects: {Medication Side Effects (Optional):12147}  Family Medical/ Social History: Changes? {EXAM; YES/NO:19492}  MENTAL HEALTH EXAM:  There were no vitals taken for this visit.There is no height or weight on file to calculate BMI.  General Appearance: {[phone]}  Eye Contact:  {PSY:22684}  Speech:  {PSY:[phone]}  Volume:  {PSY:22686}  Mood:  {PSY:22306}  Affect:  {[phone]}  Thought Process:  {PSY:22688}  Orientation:  {  EDB:77310}  Thought Content: {PSYt:22690}   Suicidal Thoughts:  {PSY:22692}  Homicidal Thoughts:  {PSY:22692}  Memory:  {PSY:[phone]}  Judgement:  {PSY:22694}  Insight:  {PSY:22695}  Psychomotor Activity:  {PSY:22696}  Concentration:  {PSY:21399}  Recall:  {PSY:22877}  Fund of Knowledge: {PSY:22877}  Language: {EDB:77122}  Assets:  {PSY:22698}  ADL's:  {PSY:22290}  Cognition:  {PSY:[id number]}  Prognosis:  {PSY:22877}    DIAGNOSES: No diagnosis found.  Receiving Psychotherapy: {EDB:78802}   RECOMMENDATIONS: ***   Redell DELENA Pizza, NP

## 2024-06-22 NOTE — Progress Notes (Signed)
 Seaborn D Gebhard [id number] 1951-05-09 73 y.o.  Virtual Visit via Video Note  I connected with pt @ on 06/22/24 at 10:30 AM EDT by a video enabled telemedicine application and verified that I am speaking with the correct person using two identifiers.   I discussed the limitations of evaluation and management by telemedicine and the availability of in person appointments. The patient expressed understanding and agreed to proceed.  I discussed the assessment and treatment plan with the patient. The patient was provided an opportunity to ask questions and all were answered. The patient agreed with the plan and demonstrated an understanding of the instructions.   The patient was advised to call back or seek an in-person evaluation if the symptoms worsen or if the condition fails to improve as anticipated.  I provided 30 minutes of non-face-to-face time during this encounter.  The patient was located at home.  The provider was located at [facility].   Jeremy DELENA Pizza, NP   Subjective:   Patient ID:  Jeremy Lopez is a 73 y.o. (DOB 08/08/1951) male.  Chief Complaint:  Chief Complaint  Patient presents with   Anxiety   Insomnia   Follow-up   Patient Education   Medication Problem    HPI Jeremy Lopez, 73 year old male presents to this office via video visit for follow up and medication management. Collateral information should be considered reliable. Says that he stopped taking Lexapro , and has not been taking any other medication for anxiety. He does report trouble sleeping and is waking up multiple times per night. He is requesting medication that may help.  Rates depression at 2/10 and anxiety at 2/10. Says Trazodone  is helping him tremendously with sleep. He is requesting medication adjustment to help with lingering anxiety.  No mania, no psychosis, no auditory or visual hallucinations.  No SI or HI.    No past psychiatric medication trials        Review of Systems:  Review  of Systems  Constitutional: Negative.   Allergic/Immunologic: Negative.   Neurological: Negative.   Psychiatric/Behavioral: Negative.      Medications: I have reviewed the patient's current medications.  Current Outpatient Medications  Medication Sig Dispense Refill   mirtazapine  (REMERON ) 15 MG tablet Take 1 tablet (15 mg total) by mouth at bedtime. 30 tablet 1   acetaminophen  (TYLENOL ) 325 MG tablet Take 1-2 tablets (325-650 mg total) by mouth every 4 (four) hours as needed for mild pain.     Blood Pressure Monitoring (OMRON 3 SERIES BP MONITOR) DEVI As directed 1 each 1   chlorpheniramine-HYDROcodone  (TUSSIONEX) 10-8 MG/5ML Take 5 mLs by mouth every 12 (twelve) hours as needed for cough. 115 mL 0   Cholecalciferol  (VITAMIN D ) 50 MCG (2000 UT) tablet Take 2,000 Units by mouth daily. (Patient taking differently: Take 2,000 Units by mouth daily.)     clopidogrel  (PLAVIX ) 75 MG tablet Take 1 tablet (75 mg total) by mouth daily. 90 tablet 1   diltiazem  (CARDIZEM  CD) 240 MG 24 hr capsule Take 1 capsule (240 mg total) by mouth daily. 90 capsule 1   diphenoxylate -atropine  (LOMOTIL ) 2.5-0.025 MG tablet Take 1 tablet by mouth 4 (four) times daily as needed for diarrhea or loose stools. 30 tablet 0   escitalopram  (LEXAPRO ) 10 MG tablet Take 1 tablet (10 mg total) by mouth daily. 30 tablet 1   Fluticasone -Umeclidin-Vilant (TRELEGY ELLIPTA ) 100-62.5-25 MCG/ACT AEPB Inhale 1 puff into the lungs daily. 1 each 10   HYDROcodone -acetaminophen  (NORCO/VICODIN) 5-325 MG tablet Take 1  tablet by mouth every 4 (four) hours as needed for moderate pain (pain score 4-6). 20 tablet 0   irbesartan  (AVAPRO ) 300 MG tablet Take 1 tablet (300 mg total) by mouth daily. 90 tablet 1   l-methylfolate-B6-B12 (METANX) 3-35-2 MG TABS tablet Take 1 tablet by mouth daily. 30 tablet 0   magnesium  oxide (MAG-OX) 400 MG tablet Take 1.5 tablets (600 mg total) by mouth at bedtime. 30 tablet 0   meclizine  (ANTIVERT ) 12.5 MG tablet Take  1 tablet (12.5 mg total) by mouth 2 (two) times daily as needed for dizziness. 30 tablet 0   metoprolol  succinate (TOPROL -XL) 100 MG 24 hr tablet Take 1 tablet (100 mg total) by mouth daily. Take with or immediately following a meal. 90 tablet 3   nitroGLYCERIN  (NITROSTAT ) 0.4 MG SL tablet Place 1 tablet (0.4 mg total) under the tongue every 5 (five) minutes as needed for chest pain. 25 tablet 2   prochlorperazine  (COMPAZINE ) 10 MG tablet Take 1 tablet (10 mg total) by mouth every 6 (six) hours as needed for nausea or vomiting. 30 tablet 0   rosuvastatin  (CRESTOR ) 40 MG tablet TAKE ONE (1) TABLET BY MOUTH EVERY DAY 90 tablet 3   tamsulosin  (FLOMAX ) 0.4 MG CAPS capsule Take 1 capsule (0.4 mg total) by mouth daily after supper. 90 capsule 3   No current facility-administered medications for this visit.    Medication Side Effects: None  Allergies:  Allergies  Allergen Reactions   Cephalexin Anaphylaxis    throat swelling Patient said he can take amoxicillin   Amlodipine  Swelling    Leg swelling    Past Medical History:  Diagnosis Date   CAD (coronary artery disease), native coronary artery    DES proximal LAD 07/2009; DES to RCA 04/2020   Chronic bronchitis (HCC)    Chronic heart failure with preserved ejection fraction (HFpEF) (HCC)    Essential hypertension    Habitual alcohol use    pt has not drank alcohol since stroke in 06/2023   History of kidney stones    Hyperlipidemia    Ocular migraine    PVC's (premature ventricular contractions)    Small cell lung cancer (HCC)    Right upper lobe 2008; no evidence of disease since treatment ended in 11/2007   Stroke (HCC) 07/05/2023    Family History  Problem Relation Age of Onset   Cancer Father    CAD Brother    Hypertension Brother    Depression Maternal Uncle    Alcohol abuse Maternal Uncle    Stroke Neg Hx     Social History   Socioeconomic History   Marital status: Married    Spouse name: [name]   Number of children: 2    Years of education: 14   Highest education level: Tax adviser degree: occupational, Scientist, product/process development, or vocational program  Occupational History   Occupation: Full time    Comment: Industrial/product designer for [employer]   Occupation: Retired Industrial/product designer    Comment: [employer]  Tobacco Use   Smoking status: Former    Current packs/day: 0.00    Types: Cigarettes    Start date: 10/25/1966    Quit date: 10/25/1998    Years since quitting: 25.6    Passive exposure: Past   Smokeless tobacco: Never  Vaping Use   Vaping status: Never Used  Substance and Sexual Activity   Alcohol use: Not Currently   Drug use: Not Currently   Sexual activity: Yes  Other Topics Concern   Not  on file  Social History Narrative   Lives in [place] with wife. Enjoys reading, playing golf in free time.    Pt retired    Teacher, early years/pre Strain: Low Risk  (10/28/2023)   Overall Financial Resource Strain (CARDIA)    Difficulty of Paying Living Expenses: Not very hard  Food Insecurity: No Food Insecurity (03/23/2024)   Hunger Vital Sign    Worried About Running Out of Food in the Last Year: Never true    Ran Out of Food in the Last Year: Never true  Transportation Needs: No Transportation Needs (03/23/2024)   PRAPARE - Administrator, Civil Service (Medical): No    Lack of Transportation (Non-Medical): No  Physical Activity: Insufficiently Active (10/28/2023)   Exercise Vital Sign    Days of Exercise per Week: 1 day    Minutes of Exercise per Session: 10 min  Stress: No Stress Concern Present (10/28/2023)   [employer] of Occupational Health - Occupational Stress Questionnaire    Feeling of Stress : Only a little  Social Connections: Moderately Integrated (03/20/2024)   Social Connection and Isolation Panel    Frequency of Communication with Friends and Family: Twice a week    Frequency of Social Gatherings with Friends and Family: Once a week    Attends Religious  Services: Patient declined    Database administrator or Organizations: Yes    Attends Engineer, structural: More than 4 times per year    Marital Status: Married  Catering manager Violence: Not At Risk (03/23/2024)   Humiliation, Afraid, Rape, and Kick questionnaire    Fear of Current or Ex-Partner: No    Emotionally Abused: No    Physically Abused: No    Sexually Abused: No    Past Medical History, Surgical history, Social history, and Family history were reviewed and updated as appropriate.   Please see review of systems for further details on the patient's review from today.   Objective:   Physical Exam:  There were no vitals taken for this visit.  Physical Exam Constitutional:      General: He is not in acute distress.    Appearance: Normal appearance.  Neurological:     Mental Status: He is alert and oriented to person, place, and time.     Gait: Gait normal.  Psychiatric:        Attention and Perception: Attention and perception normal. He does not perceive auditory or visual hallucinations.        Mood and Affect: Mood and affect normal. Mood is not anxious or depressed. Affect is not labile.        Speech: Speech normal.        Behavior: Behavior normal. Behavior is cooperative.        Thought Content: Thought content normal.        Cognition and Memory: Cognition and memory normal.        Judgment: Judgment normal.     Lab Review:     Component Value Date/Time   NA 136 03/17/2024 1430   NA 140 01/18/2023 1053   K 4.3 03/17/2024 1430   CL 102 03/17/2024 1430   CO2 26 03/17/2024 1430   GLUCOSE 99 03/17/2024 1430   BUN 17 03/17/2024 1430   BUN 19 01/18/2023 1053   CREATININE 1.12 03/17/2024 1430   CREATININE 1.21 12/28/2019 1501   CALCIUM  9.4 03/17/2024 1430   PROT 6.0 (L) 07/06/2023 0602  PROT 6.9 01/18/2023 1053   ALBUMIN 3.3 (L) 07/06/2023 0602   ALBUMIN 4.4 01/18/2023 1053   AST 11 (L) 07/06/2023 0602   ALT 13 07/06/2023 0602   ALKPHOS 61  07/06/2023 0602   BILITOT 0.6 07/06/2023 0602   BILITOT 0.4 01/18/2023 1053   GFRNONAA >60 03/17/2024 1430   GFRAA >60 04/19/2020 1348       Component Value Date/Time   WBC 10.5 03/17/2024 1430   RBC 4.95 03/17/2024 1430   HGB 14.6 03/17/2024 1430   HGB 15.5 01/18/2023 1053   HGB 11.8 (L) 10/14/2007 1541   HCT 45.0 03/17/2024 1430   HCT 46.5 01/18/2023 1053   HCT 35.2 (L) 10/14/2007 1541   PLT 443 (H) 03/17/2024 1430   PLT 350 01/18/2023 1053   MCV 90.9 03/17/2024 1430   MCV 92 01/18/2023 1053   MCV 96.4 10/14/2007 1541   MCH 29.5 03/17/2024 1430   MCHC 32.4 03/17/2024 1430   RDW 14.6 03/17/2024 1430   RDW 13.2 01/18/2023 1053   RDW 20.5 (H) 10/14/2007 1541   LYMPHSABS 0.7 07/05/2023 1543   LYMPHSABS 1.3 01/18/2023 1053   LYMPHSABS 0.1 (L) 10/14/2007 1541   MONOABS 0.8 07/05/2023 1543   MONOABS 0.2 10/14/2007 1541   EOSABS 0.0 07/05/2023 1543   EOSABS 0.1 01/18/2023 1053   BASOSABS 0.0 07/05/2023 1543   BASOSABS 0.1 01/18/2023 1053   BASOSABS 0.0 10/14/2007 1541    No results found for: POCLITH, LITHIUM   No results found for: PHENYTOIN, PHENOBARB, VALPROATE, CBMZ   .res Assessment: Plan:    Greater than 50% of 30  min video visit time with patient was spent on counseling and coordination of care. Talked about his significant improvement with anxiety but still not getting good quality sleep. He is requesting medication today.    We agreed today to:   Stopped Wellbutrin  150 mg XL daily after breakfast Stopped trazodone  50 mg at bedtime for assistance and improving sleep quality. To follow-up in 6  months to reassess Provided emergency contact information Will report worsening symptoms or side effects promptly Reviewed PDMP    Artist was seen today for anxiety, insomnia, follow-up, patient education and medication problem.  Diagnoses and all orders for this visit:  Other insomnia -     mirtazapine  (REMERON ) 15 MG tablet; Take 1 tablet (15 mg  total) by mouth at bedtime.     Please see After Visit Summary for patient specific instructions.  Future Appointments  Date Time Provider Department Center  06/23/2024  1:20 PM Raulkar, Sven SQUIBB, MD [facility]  07/22/2024  1:40 PM [name], MD [facility]  08/10/2024  3:50 PM [facility]-ANNUAL WELLNESS VISIT [facility] [address]  10/30/2024  1:40 PM Bevely Doffing, FNP [facility] [address]    No orders of the defined types were placed in this encounter.     -------------------------------

## 2024-06-23 ENCOUNTER — Encounter: Admitting: Physical Medicine and Rehabilitation

## 2024-07-15 ENCOUNTER — Ambulatory Visit

## 2024-07-15 VITALS — BP 178/136 | HR 64 | Ht 70.0 in | Wt 209.1 lb

## 2024-07-15 DIAGNOSIS — J0100 Acute maxillary sinusitis, unspecified: Secondary | ICD-10-CM

## 2024-07-15 MED ORDER — AZITHROMYCIN 250 MG PO TABS: ORAL_TABLET | ORAL | 0 refills | Status: AC

## 2024-07-15 NOTE — Progress Notes (Signed)
 Established Patient Office Visit  Subjective   Patient ID: Jeremy Lopez, male    DOB: 03-01-1951  Age: 73 y.o. MRN: [id number]  Chief Complaint  Patient presents with   URI    States started a couple weeks ago and hasn't really resolved     HPI Discussed the use of AI scribe software for clinical note transcription with the patient, who gave verbal consent to proceed.  History of Present Illness   Jeremy Lopez is a 73 year old male who presents with upper respiratory symptoms and sinus congestion.  Upper respiratory symptoms - Upper respiratory symptoms with some improvement using Trelegy - Runny nose for the past two weeks - No sore throat  Sinus congestion and facial pain - Significant sinus congestion, particularly in the afternoons and evenings - Facial tightness and pain, especially with eye movement - Wearing glasses is uncomfortable due to facial pressure - No dental pain  Ear symptoms - Sensation of ears being 'stopped up' - Tinnitus, worsening over the past six months  Allergies - Allergic to Keflex and amlodipine   Implanted shunt - History of shunt placement - Shunt appears to be functioning well and is satisfactory      Patient Active Problem List   Diagnosis Date Noted   Hospital discharge follow-up 03/26/2024   Cellulitis of abdominal wall 03/26/2024   Normal pressure hydrocephalus (HCC) 03/20/2024   NPH (normal pressure hydrocephalus) (HCC) 03/11/2024   Abnormality of gait 03/11/2024   Diarrhea 11/01/2023   Acute bacterial bronchitis 09/24/2023   Acute CVA (cerebrovascular accident) (HCC) 07/13/2023   Dizziness 07/12/2023   Cerebellar stroke, acute (HCC) 07/07/2023   CVA (cerebral vascular accident) (HCC) 07/05/2023   Headache 07/05/2023   Prediabetes 02/01/2023   CKD (chronic kidney disease) stage 2, GFR 60-89 ml/min 02/01/2023   Nausea 02/01/2023   Colon cancer screening 02/01/2023   CHF (congestive heart failure) (HCC) 01/23/2023    Numbness and tingling of both lower extremities 01/23/2023   Encounter for general adult medical examination with abnormal findings 01/23/2023   Allergic rhinitis 01/23/2022   Asthmatic bronchitis, mild persistent, uncomplicated 01/23/2022   Upper airway cough syndrome 01/23/2022   Simple chronic bronchitis (HCC) 12/28/2021   Seasonal allergies 12/28/2021   DOE (dyspnea on exertion)    Nosebleed 10/25/2014   Lung cancer (HCC) 02/05/2010   CAD (coronary artery disease), native coronary artery 02/05/2010   Mixed hyperlipidemia 08/16/2009   Essential hypertension 07/08/2009    ROS    Objective:     BP (!) 178/136   Pulse 64   Ht 5' 10 (1.778 m)   Wt 209 lb 1.3 oz (94.8 kg)   SpO2 95%   BMI 30.00 kg/m  BP Readings from Last 3 Encounters:  07/15/24 (!) 178/136  05/01/24 (!) 140/74  03/26/24 (!) 162/80   Wt Readings from Last 3 Encounters:  07/15/24 209 lb 1.3 oz (94.8 kg)  05/01/24 200 lb (90.7 kg)  03/26/24 196 lb 9.6 oz (89.2 kg)     Physical Exam Vitals and nursing note reviewed.  Constitutional:      Appearance: Normal appearance.  HENT:     Head: Normocephalic.     Right Ear: Tympanic membrane, ear canal and external ear normal.     Left Ear: Tympanic membrane, ear canal and external ear normal.     Nose: Congestion and rhinorrhea present.     Right Turbinates: Enlarged.     Left Turbinates: Enlarged.     Right Sinus:  Maxillary sinus tenderness present. No frontal sinus tenderness.     Left Sinus: Maxillary sinus tenderness present. No frontal sinus tenderness.     Mouth/Throat:     Mouth: Mucous membranes are moist.     Pharynx: Oropharynx is clear.  Eyes:     Extraocular Movements: Extraocular movements intact.     Pupils: Pupils are equal, round, and reactive to light.  Cardiovascular:     Rate and Rhythm: Normal rate and regular rhythm.  Pulmonary:     Effort: Pulmonary effort is normal.     Breath sounds: Normal breath sounds.  Musculoskeletal:      Cervical back: Normal range of motion and neck supple.  Skin:    General: Skin is warm and dry.  Neurological:     Mental Status: He is alert and oriented to person, place, and time.  Psychiatric:        Mood and Affect: Mood normal.        Thought Content: Thought content normal.    No results found for any visits on 07/15/24.    The ASCVD Risk score (Arnett DK, et al., 2019) failed to calculate for the following reasons:   Risk score cannot be calculated because patient has a medical history suggesting prior/existing ASCVD    Assessment & Plan:   Problem List Items Addressed This Visit   None Visit Diagnoses       Acute non-recurrent maxillary sinusitis    -  Primary   Acute sinusitis with eustachian tube dysfunction.  oral antibiotic added and instructed to use Flonase , one spray in each nostril daily.   Relevant Medications   azithromycin  (ZITHROMAX ) 250 MG tablet      No follow-ups on file.    Leita Longs, FNP

## 2024-07-21 NOTE — Progress Notes (Signed)
 Cardiology Office Note:  .   Date: 08/05/2023  ID:  Jeremy Lopez, DOB Oct 22, 1950, MRN [id number] PCP: Bevely Doffing, FNP  [facility] Providers Cardiologist:  Vina Gull, MD     History of Present Illness: .   Jeremy Lopez is a 73 y.o. male with a PMH of CAD, hyperlipidemia, hypertension, former tobacco user, leg edema, PVCs, CKD stage II, suspected HFpEF, past history of lung cancer and status post radiation/chemo, EtOH abuse, COPD/radiation fibrosis, (follows pulmonology), acute CVA, who presents today for scheduled follow-up.   Previous CV history includes DES to proximal LAD in 2010, the following year he received a drug-eluting stent to the mid RCA.  CTA previously revealed radiation fibrosis, history of DOE.  Echo 9/23 revealed EF 60 to 65%, grade 1 DD.  Cardiac authorization at that time revealed nonobstructive CAD in RCA and patent stents, mildly elevated LVEDP.  Was recommended to switch diltiazem  to Entresto  and add Jardiance .   Pt seen in  neurology  for gait issues, mild parkinsonian is him noted at recent neuro visit.  MRI revealed ventriculomegaly and NPH on the differential.  October 2024 Hospitalized for  acute CVA.  MRI of the brain without contrast with acute versus early subacute infarct medial inferior right cerebellum.  Evaluated by neurology with recommendation for aspirin  and Plavix  x 3 weeks, then followed by aspirin  alone.  Admitted to rehab for inpatient therapies.  Diltiazem  240 mg was discontinued on acute side due to orthostatic hypotension.  I saw the pt in Dec 2024  Seen by FORBES Crate in [name] 2025  Since seen he denies CP  Has bronchitis now   Just started a Z pack last night   Nonproductive cough     Says he had some pvcs last night   No dizziness     Studies Reviewed: .    Echo 06/2023:  1. Left ventricular ejection fraction, by estimation, is 60 to 65%. The  left ventricle has normal function. The left ventricle has no regional  wall motion  abnormalities. There is mild left ventricular hypertrophy.  Left ventricular diastolic parameters are consistent with Grade I diastolic dysfunction (impaired relaxation).   2. Right ventricular systolic function is normal. The right ventricular  size is normal. Tricuspid regurgitation signal is inadequate for assessing PA pressure.   3. The mitral valve is normal in structure. No evidence of mitral valve regurgitation.   4. The aortic valve is tricuspid. Aortic valve regurgitation is not  visualized. Aortic valve sclerosis is present, with no evidence of aortic valve stenosis.   5. The inferior vena cava is normal in size with greater than 50%  respiratory variability, suggesting right atrial pressure of 3 mmHg.  LHC 06/2022:    Mid RCA to Dist RCA lesion is 40% stenosed.   Mid RCA lesion is 30% stenosed.   Non-stenotic Prox LAD lesion was previously treated.   LV end diastolic pressure is mildly elevated.   Nonobstructive CAD. Prior stents in the LAD and RCA are patent Mildly elevated LVEDP 17 mm Hg   Plan: medical management.   Right/Left heart cath 04/2020:  Mid RCA lesion is 99% stenosed, heavily calcified. Following atherectomy, A drug-eluting stent was successfully placed using a STENT RESOLUTE ONYX 3.5X15, postdilated to 3.75 mm; stent optimized with IVUS. Post intervention, there is a 0% residual stenosis. Mid RCA to Dist RCA lesion is 40% stenosed. Previously placed Prox LAD stent, is widely patent. The left ventricular systolic function is normal.  LV end diastolic pressure is normal. The left ventricular ejection fraction is 55-65% by visual estimate. There is no aortic valve stenosis.   Continue aggressive secondary prevention.  Still eligible for same-day discharge.   Consider Plavix  monotherapy given his diffuse disease after DAPT is completed.  Lexiscan  04/2020:  Nuclear stress EF: 68%. There was no ST segment deviation noted during stress. No T wave inversion was  noted during stress. The study is normal. This is a low risk study. The left ventricular ejection fraction is hyperdynamic (>65%).   1. Reduced counts in the inferior wall that improve on stress imaging with normal wall motion consistent with diaphragm attenuation.  2. No ischemia or infarction.  3. Normal LVEF, >65%.  4. Average exercise capacity (5:23 min:s; 7 METS). 5. Normal HR/BP response to exercise.  6. This is a low-risk study.  Physical Exam:   VS:  BP (!) 152/80   Pulse 65   Ht 5' 10 (1.778 m)   Wt 213 lb 9.6 oz (96.9 kg)   SpO2 96%   BMI 30.65 kg/m    Wt Readings from Last 3 Encounters:  07/22/24 213 lb 9.6 oz (96.9 kg)  07/15/24 209 lb 1.3 oz (94.8 kg)  05/01/24 200 lb (90.7 kg)    GEN: Well nourished, well developed in no acute distress NECK: No JVD; No carotid bruits CARDIAC: S1/S2, RRR, no murmurs RESPIRATORY:  Clear to auscultation MIld rhonchi EXTREMITIES:  No LE edema  NSR   65 bpm  LAD   ASSESSMENT AND PLAN: .    HFpEF Echo in Sept 2024  LVEF 60-65%, Grade 1 DD 06/2023.  Volume status on exam is good     Follow     CAD,  LHC in Sept 2023   Mild CAD    Pt remains without symptoms of anigna     3   HTN   BP is elevated     Will add hydrochlorothiazide 12.5 mg to regimen  Get BMET in 10 days    FOllow BP at home  GOal 120s to low 130s   4  HL   Will get NMR panel when comes back for labs   5 CVA  Hx of  Continue on Plavix    6  MEtabolics    Will check A1C and TSH with other labs   Signed, Vina Gull, MD

## 2024-07-22 ENCOUNTER — Ambulatory Visit: Admitting: Internal Medicine

## 2024-07-22 ENCOUNTER — Encounter: Payer: Self-pay | Admitting: Internal Medicine

## 2024-07-22 ENCOUNTER — Ambulatory Visit: Attending: Internal Medicine | Admitting: Internal Medicine

## 2024-07-22 VITALS — BP 152/80 | HR 65 | Ht 70.0 in | Wt 213.6 lb

## 2024-07-22 DIAGNOSIS — E785 Hyperlipidemia, unspecified: Secondary | ICD-10-CM

## 2024-07-22 DIAGNOSIS — I5032 Chronic diastolic (congestive) heart failure: Secondary | ICD-10-CM | POA: Diagnosis not present

## 2024-07-22 DIAGNOSIS — R7303 Prediabetes: Secondary | ICD-10-CM | POA: Diagnosis not present

## 2024-07-22 MED ORDER — HYDROCHLOROTHIAZIDE 25 MG PO TABS: 12.5000 mg | ORAL_TABLET | Freq: Every day | ORAL | 3 refills | Status: AC

## 2024-07-22 NOTE — Patient Instructions (Signed)
 Medication Instructions:  Your physician has recommended you make the following change in your medication:   -Start Hydrochlorothiazide 12.5 mg tablets once daily   *If you need a refill on your cardiac medications before your next appointment, please call your pharmacy*  Lab Work: BMET- In 10 days NMR A1C TSH  If you have labs (blood work) drawn today and your tests are completely normal, you will receive your results only by: MyChart Message (if you have MyChart) OR A paper copy in the mail If you have any lab test that is abnormal or we need to change your treatment, we will call you to review the results.  Testing/Procedures: None  Follow-Up: At [facility], you and your health needs are our priority.  As part of our continuing mission to provide you with exceptional heart care, our providers are all part of one team.  This team includes your primary Cardiologist (physician) and Advanced Practice Providers or APPs (Physician Assistants and Nurse Practitioners) who all work together to provide you with the care you need, when you need it.  Your next appointment:   9 month(s)  Provider:   You may see Vina Gull, MD or one of the following Advanced Practice Providers on your designated Care Team:   Laymon Qua, PA-C  [place], [place] Olivia Pavy, [place]     We recommend signing up for the patient portal called MyChart.  Sign up information is provided on this After Visit Summary.  MyChart is used to connect with patients for Virtual Visits (Telemedicine).  Patients are able to view lab/test results, encounter notes, upcoming appointments, etc.  Non-urgent messages can be sent to your provider as well.   To learn more about what you can do with MyChart, go to [email or web address].   Other Instructions

## 2024-08-06 ENCOUNTER — Other Ambulatory Visit: Payer: Self-pay | Admitting: Physician Assistant

## 2024-08-07 ENCOUNTER — Encounter: Admitting: Physical Medicine and Rehabilitation

## 2024-08-10 ENCOUNTER — Ambulatory Visit

## 2024-08-11 ENCOUNTER — Other Ambulatory Visit: Payer: Self-pay | Admitting: Internal Medicine

## 2024-08-24 ENCOUNTER — Other Ambulatory Visit: Payer: Self-pay | Admitting: Behavioral Health

## 2024-08-24 DIAGNOSIS — G4709 Other insomnia: Secondary | ICD-10-CM

## 2024-09-21 ENCOUNTER — Encounter: Admitting: Physical Medicine and Rehabilitation

## 2024-10-05 ENCOUNTER — Other Ambulatory Visit: Payer: Self-pay | Admitting: Internal Medicine

## 2024-10-05 DIAGNOSIS — I10 Essential (primary) hypertension: Secondary | ICD-10-CM

## 2024-10-09 ENCOUNTER — Ambulatory Visit

## 2024-10-09 VITALS — Ht 70.0 in | Wt 215.0 lb

## 2024-10-09 DIAGNOSIS — Z0000 Encounter for general adult medical examination without abnormal findings: Secondary | ICD-10-CM

## 2024-10-09 NOTE — Progress Notes (Signed)
 "  No voiced or noted concerns at this time. Chief Complaint  Patient presents with   Medicare Wellness     Subjective:   Jeremy Lopez is a 74 y.o. male who presents for a Medicare Annual Wellness Visit.  Visit info / Clinical Intake: Medicare Wellness Visit Type:: Initial Annual Wellness Visit Persons participating in visit and providing information:: patient Medicare Wellness Visit Mode:: Video Since this visit was completed virtually, some vitals may be partially provided or unavailable. Missing vitals are due to the limitations of the virtual format.: Documented vitals are patient reported If Telephone or Video please confirm:: I connected with patient using audio/video enable telemedicine. I verified patient identity with two identifiers, discussed telehealth limitations, and patient agreed to proceed. Patient Location:: home Provider Location:: office Interpreter Needed?: No Pre-visit prep was completed: yes AWV questionnaire completed by patient prior to visit?: no Living arrangements:: lives with spouse/significant other Patient's Overall Health Status Rating: (!) fair Typical amount of pain: none Does pain affect daily life?: no Are you currently prescribed opioids?: (!) yes  Dietary Habits and Nutritional Risks How many meals a day?: 2 Eats fruit and vegetables daily?: yes Most meals are obtained by: preparing own meals In the last 2 weeks, have you had any of the following?: none Diabetic:: no  Functional Status Activities of Daily Living (to include ambulation/medication): Independent Ambulation: Independent Medication Administration: Independent Home Management (perform basic housework or laundry): Independent Manage your own finances?: yes Primary transportation is: driving Concerns about vision?: no *vision screening is required for WTM* Concerns about hearing?: no  Fall Screening Falls in the past year?: 0 Number of falls in past year: 0 Was there an  injury with Fall?: 0 Fall Risk Category Calculator: 0 Patient Fall Risk Level: Low Fall Risk  Fall Risk Patient at Risk for Falls Due to: No Fall Risks Fall risk Follow up: Falls evaluation completed; Education provided; Falls prevention discussed  Home and Transportation Safety: All rugs have non-skid backing?: yes All stairs or steps have railings?: yes Grab bars in the bathtub or shower?: (!) no Have non-skid surface in bathtub or shower?: yes Good home lighting?: yes Regular seat belt use?: yes Hospital stays in the last year:: (!) yes How many hospital stays:: 1 Reason: surgery  Cognitive Assessment Difficulty concentrating, remembering, or making decisions? : no Will 6CIT or Mini Cog be Completed: yes What year is it?: 0 points What month is it?: 0 points Give patient an address phrase to remember (5 components): [address] [facility] About what time is it?: 0 points Count backwards from 20 to 1: 0 points Say the months of the year in reverse: 0 points Repeat the address phrase from earlier: 0 points 6 CIT Score: 0 points  Advance Directives (For Healthcare) Does Patient Have a Medical Advance Directive?: No Type of Advance Directive: Healthcare Power of Attorney Copy of Healthcare Power of Attorney in Chart?: No - copy requested Copy of Living Will in Chart?: No - copy requested Would patient like information on creating a medical advance directive?: No - Patient declined  Reviewed/Updated  Reviewed/Updated: Reviewed All (Medical, Surgical, Family, Medications, Allergies, Care Teams, Patient Goals)    Allergies (verified) Cephalexin and Amlodipine    Current Medications (verified) Outpatient Encounter Medications as of 10/09/2024  Medication Sig   Blood Pressure Monitoring (OMRON 3 SERIES BP MONITOR) DEVI As directed   chlorpheniramine-HYDROcodone  (TUSSIONEX) 10-8 MG/5ML Take 5 mLs by mouth every 12 (twelve) hours as needed for cough.  Cholecalciferol   (VITAMIN D ) 50 MCG (2000 UT) tablet Take 2,000 Units by mouth daily.   clopidogrel  (PLAVIX ) 75 MG tablet TAKE ONE TABLET (75MG  TOTAL) BY MOUTH DAILY.   diltiazem  (CARDIZEM  CD) 240 MG 24 hr capsule TAKE 1 CAPSULE (240 MG TOTAL) BY MOUTH DAILY   diphenoxylate -atropine  (LOMOTIL ) 2.5-0.025 MG tablet Take 1 tablet by mouth 4 (four) times daily as needed for diarrhea or loose stools.   escitalopram  (LEXAPRO ) 10 MG tablet Take 1 tablet (10 mg total) by mouth daily.   Fluticasone -Umeclidin-Vilant (TRELEGY ELLIPTA ) 100-62.5-25 MCG/ACT AEPB Inhale 1 puff into the lungs daily.   hydrochlorothiazide  (HYDRODIURIL ) 25 MG tablet Take 0.5 tablets (12.5 mg total) by mouth daily.   HYDROcodone -acetaminophen  (NORCO/VICODIN) 5-325 MG tablet Take 1 tablet by mouth every 4 (four) hours as needed for moderate pain (pain score 4-6).   irbesartan  (AVAPRO ) 300 MG tablet Take 1 tablet (300 mg total) by mouth daily.   l-methylfolate-B6-B12 (METANX) 3-35-2 MG TABS tablet Take 1 tablet by mouth daily.   magnesium  oxide (MAG-OX) 400 MG tablet Take 1.5 tablets (600 mg total) by mouth at bedtime.   meclizine  (ANTIVERT ) 12.5 MG tablet Take 1 tablet (12.5 mg total) by mouth 2 (two) times daily as needed for dizziness.   metoprolol  succinate (TOPROL -XL) 100 MG 24 hr tablet TAKE ONE TABLET (100MG  TOTAL) BY MOUTH DAILY. TAKE WITH OR IMMEDIATELY FOLLOWING A MEAL.   mirtazapine  (REMERON ) 15 MG tablet TAKE ONE TABLET (15MG  TOTAL) BY MOUTH ATBEDTIME   nitroGLYCERIN  (NITROSTAT ) 0.4 MG SL tablet Place 1 tablet (0.4 mg total) under the tongue every 5 (five) minutes as needed for chest pain.   prochlorperazine  (COMPAZINE ) 10 MG tablet Take 1 tablet (10 mg total) by mouth every 6 (six) hours as needed for nausea or vomiting.   rosuvastatin  (CRESTOR ) 40 MG tablet TAKE ONE (1) TABLET BY MOUTH EVERY DAY   tamsulosin  (FLOMAX ) 0.4 MG CAPS capsule Take 1 capsule (0.4 mg total) by mouth daily after supper.   [DISCONTINUED] acetaminophen  (TYLENOL ) 325  MG tablet Take 1-2 tablets (325-650 mg total) by mouth every 4 (four) hours as needed for mild pain.   No facility-administered encounter medications on file as of 10/09/2024.    History: Past Medical History:  Diagnosis Date   CAD (coronary artery disease), native coronary artery    DES proximal LAD 07/2009; DES to RCA 04/2020   Chronic bronchitis (HCC)    Chronic heart failure with preserved ejection fraction (HFpEF) (HCC)    Essential hypertension    Habitual alcohol use    pt has not drank alcohol since stroke in 06/2023   History of kidney stones    Hyperlipidemia    Ocular migraine    PVC's (premature ventricular contractions)    Small cell lung cancer (HCC)    Right upper lobe 2008; no evidence of disease since treatment ended in 11/2007   Stroke (HCC) 07/05/2023   Past Surgical History:  Procedure Laterality Date   CORONARY ATHERECTOMY N/A 04/26/2020   Procedure: CORONARY ATHERECTOMY;  Surgeon: Dann Candyce RAMAN, MD;  Location: [facility] INVASIVE CV LAB;  Service: Cardiovascular;  Laterality: N/A;   CORONARY STENT INTERVENTION N/A 04/26/2020   Procedure: CORONARY STENT INTERVENTION;  Surgeon: Dann Candyce RAMAN, MD;  Location: [facility] INVASIVE CV LAB;  Service: Cardiovascular;  Laterality: N/A;   CORONARY ULTRASOUND/IVUS N/A 04/26/2020   Procedure: Intravascular Ultrasound/IVUS;  Surgeon: Dann Candyce RAMAN, MD;  Location: [facility] INVASIVE CV LAB;  Service: Cardiovascular;  Laterality: N/A;   Insertion of left  subclavian Port-A-Cath  2008   LAPAROSCOPY N/A 03/20/2024   Procedure: LAPAROSCOPY, DIAGNOSTIC;  Surgeon: Dasie Leonor CROME, MD;  Location: [facility] OR;  Service: General;  Laterality: N/A;   LEFT HEART CATH AND CORONARY ANGIOGRAPHY N/A 07/03/2022   Procedure: LEFT HEART CATH AND CORONARY ANGIOGRAPHY;  Surgeon: Jordan, Peter M, MD;  Location: [facility] INVASIVE CV LAB;  Service: Cardiovascular;  Laterality: N/A;   RIGHT/LEFT HEART CATH AND CORONARY ANGIOGRAPHY N/A 04/26/2020   Procedure: RIGHT/LEFT  HEART CATH AND CORONARY ANGIOGRAPHY;  Surgeon: Dann Candyce RAMAN, MD;  Location: [facility] INVASIVE CV LAB;  Service: Cardiovascular;  Laterality: N/A;   TEMPORARY PACEMAKER N/A 04/26/2020   Procedure: TEMPORARY PACEMAKER;  Surgeon: Dann Candyce RAMAN, MD;  Location: [facility] INVASIVE CV LAB;  Service: Cardiovascular;  Laterality: N/A;   VENTRICULOPERITONEAL SHUNT N/A 03/20/2024   Procedure: SHUNT INSERTION VENTRICULAR-PERITONEAL;  Surgeon: Lanis Pupa, MD;  Location: [facility] OR;  Service: Neurosurgery;  Laterality: N/A;  LAPAROSCOPIC ASSISTED VP SHUNT PLACEMENT (DOW CO-SURGEON)   Video bronchoscopy and video mediastinoscopy  2008   Family History  Problem Relation Age of Onset   Cancer Father    CAD Brother    Hypertension Brother    Depression Maternal Uncle    Alcohol abuse Maternal Uncle    Stroke Neg Hx    Social History   Occupational History   Occupation: Full time    Comment: Industrial/product designer for [employer]   Occupation: Retired Industrial/product Designer    Comment: [employer]  Tobacco Use   Smoking status: Former    Current packs/day: 0.00    Types: Cigarettes    Start date: 10/25/1966    Quit date: 10/25/1998    Years since quitting: 25.9    Passive exposure: Past   Smokeless tobacco: Never  Vaping Use   Vaping status: Never Used  Substance and Sexual Activity   Alcohol use: Not Currently   Drug use: Not Currently   Sexual activity: Yes   Tobacco Counseling Counseling given: Yes  SDOH Screenings   Food Insecurity: No Food Insecurity (10/09/2024)  Housing: Low Risk (10/09/2024)  Transportation Needs: No Transportation Needs (10/09/2024)  Utilities: Not At Risk (10/09/2024)  Alcohol Screen: Medium Risk (04/03/2023)  Depression (PHQ2-9): Low Risk (10/09/2024)  Financial Resource Strain: Low Risk (10/28/2023)  Physical Activity: Sufficiently Active (10/09/2024)  Social Connections: Moderately Integrated (10/09/2024)  Stress: No Stress Concern Present (10/09/2024)  Tobacco Use: Medium Risk  (10/09/2024)  Health Literacy: Adequate Health Literacy (10/09/2024)   See flowsheets for full screening details  Depression Screen PHQ 2 & 9 Depression Scale- Over the past 2 weeks, how often have you been bothered by any of the following problems? Little interest or pleasure in doing things: 0 Feeling down, depressed, or hopeless (PHQ Adolescent also includes...irritable): 0 PHQ-2 Total Score: 0 Trouble falling or staying asleep, or sleeping too much: 0 Feeling tired or having little energy: 0 Poor appetite or overeating (PHQ Adolescent also includes...weight loss): 0 Feeling bad about yourself - or that you are a failure or have let yourself or your family down: 0 Trouble concentrating on things, such as reading the newspaper or watching television (PHQ Adolescent also includes...like school work): 0 Moving or speaking so slowly that other people could have noticed. Or the opposite - being so fidgety or restless that you have been moving around a lot more than usual: 0 Thoughts that you would be better off dead, or of hurting yourself in some way: 0 PHQ-9 Total Score: 0 If you checked  off any problems, how difficult have these problems made it for you to do your work, take care of things at home, or get along with other people?: Not difficult at all  Depression Treatment Depression Interventions/Treatment : EYV7-0 Score <4 Follow-up Not Indicated     Goals Addressed               This Visit's Progress     Remain active and healhy (pt-stated)               Objective:    Today's Vitals   10/09/24 1558  Weight: 215 lb (97.5 kg)  Height: 5' 10 (1.778 m)   Body mass index is 30.85 kg/m.  Hearing/Vision screen Hearing Screening - Comments:: Patient denies any hearing difficulties.   Vision Screening - Comments:: Patient is up to date on yearly eye exams with Oneil Kawasaki  Immunizations and Health Maintenance Health Maintenance  Topic Date Due   Medicare Annual  Wellness (AWV)  Never done   Hepatitis C Screening  Never done   COVID-19 Vaccine (4 - 2025-26 season) 06/08/2024   Influenza Vaccine  01/05/2025 (Originally 05/08/2024)   Fecal DNA (Cologuard)  02/26/2026   DTaP/Tdap/Td (2 - Td or Tdap) 05/07/2028   Pneumococcal Vaccine: 50+ Years  Completed   Zoster Vaccines- Shingrix  Completed   Meningococcal B Vaccine  Aged Out        Assessment/Plan:  This is a routine wellness examination for [place].  Patient Care Team: Bevely Doffing, FNP as PCP - General (Family Medicine) [name], MD as Consulting Physician (Cardiology) Kawasaki Oneil, DO (Optometry) Lanis Pupa, MD as Consulting Physician (Neurosurgery) Lorilee, Sven SQUIBB, MD as Consulting Physician (Physical Medicine and Rehabilitation) Teresa Redell LABOR, NP as Nurse Practitioner [facility])  I have personally reviewed and noted the following in the patients chart:   Medical and social history Use of alcohol, tobacco or illicit drugs  Current medications and supplements including opioid prescriptions. Functional ability and status Nutritional status Physical activity Advanced directives List of other physicians Hospitalizations, surgeries, and ER visits in previous 12 months Vitals Screenings to include cognitive, depression, and falls Referrals and appointments  No orders of the defined types were placed in this encounter.  In addition, I have reviewed and discussed with patient certain preventive protocols, quality metrics, and best practice recommendations. A written personalized care plan for preventive services as well as general preventive health recommendations were provided to patient.   Lamari Youngers, CMA   10/09/2024   No follow-ups on file.  After Visit Summary: (MyChart) Due to this being a telephonic visit, the after visit summary with patients personalized plan was offered to patient via MyChart    "

## 2024-10-09 NOTE — Patient Instructions (Signed)
 Jeremy Lopez,  Thank you for taking the time for your Medicare Wellness Visit. I appreciate your continued commitment to your health goals. Please review the care plan we discussed, and feel free to reach out if I can assist you further.  Please note that Annual Wellness Visits do not include a physical exam. Some assessments may be limited, especially if the visit was conducted virtually. If needed, we may recommend an in-person follow-up with your provider.  Ongoing Care Seeing your primary care provider every 3 to 6 months helps us  monitor your health and provide consistent, personalized care.   1 year follow up for Medicare well visit: Tuesday October 12, 2025 at 3:50 pm with medicare wellness nurse in office  Referrals If a referral was made during today's visit and you haven't received any updates within two weeks, please contact the referred provider directly to check on the status.  N/a  Recommended Screenings:  Health Maintenance  Topic Date Due   Medicare Annual Wellness Visit  Never done   Hepatitis C Screening  Never done   COVID-19 Vaccine (4 - 2025-26 season) 06/08/2024   Flu Shot  01/05/2025*   Cologuard (Stool DNA test)  02/26/2026   DTaP/Tdap/Td vaccine (2 - Td or Tdap) 05/07/2028   Pneumococcal Vaccine for age over 72  Completed   Zoster (Shingles) Vaccine  Completed   Meningitis B Vaccine  Aged Out  *Topic was postponed. The date shown is not the original due date.       10/09/2024    4:01 PM  Advanced Directives  Does Patient Have a Medical Advance Directive? No  Would patient like information on creating a medical advance directive? No - Patient declined    Vision: Annual vision screenings are recommended for early detection of glaucoma, cataracts, and diabetic retinopathy. These exams can also reveal signs of chronic conditions such as diabetes and high blood pressure.  Dental: Annual dental screenings help detect early signs of oral cancer, gum disease,  and other conditions linked to overall health, including heart disease and diabetes.  Please see the attached documents for additional preventive care recommendations.

## 2024-10-12 ENCOUNTER — Encounter: Payer: Self-pay | Admitting: Physical Medicine and Rehabilitation

## 2024-10-12 ENCOUNTER — Encounter: Attending: Physical Medicine and Rehabilitation | Admitting: Physical Medicine and Rehabilitation

## 2024-10-12 VITALS — BP 159/84 | HR 74 | Ht 70.0 in | Wt 217.6 lb

## 2024-10-12 DIAGNOSIS — I639 Cerebral infarction, unspecified: Secondary | ICD-10-CM | POA: Diagnosis not present

## 2024-10-12 DIAGNOSIS — I10 Essential (primary) hypertension: Secondary | ICD-10-CM | POA: Diagnosis not present

## 2024-10-12 DIAGNOSIS — J410 Simple chronic bronchitis: Secondary | ICD-10-CM | POA: Diagnosis not present

## 2024-10-12 DIAGNOSIS — R0981 Nasal congestion: Secondary | ICD-10-CM | POA: Diagnosis not present

## 2024-10-12 DIAGNOSIS — R3915 Urgency of urination: Secondary | ICD-10-CM | POA: Diagnosis not present

## 2024-10-12 MED ORDER — HYDROCOD POLI-CHLORPHE POLI ER 10-8 MG/5ML PO SUER: 5.0000 mL | Freq: Two times a day (BID) | ORAL | 0 refills | Status: AC | PRN

## 2024-10-12 NOTE — Patient Instructions (Signed)
 Olive leaf extract

## 2024-10-12 NOTE — Progress Notes (Signed)
 "  Subjective:    Patient ID: Jeremy Lopez, male    DOB: 1951-05-11, 74 y.o.   MRN: [id number]  HPI 1) Sinus congestion -he has recurrent sinus issues -he eats bagel and bread -he has been taking zinc  for 1 week or 2 and it helps  2) Bronchitis: -in the hospital he received  -he sleeps on 1 pillows -he has had benefit from Tussionix   3) NPH: -this was placed by Dr. Val -no balance of cognitive issues  Pain Inventory Average Pain 3 Pain Right Now 0 My pain is N/A  In the last 24 hours, has pain interfered with the following? General activity 5 Relation with others 5 Enjoyment of life 5 What TIME of day is your pain at its worst? night Sleep (in general) N/A  Pain is worse with: N/A Pain improves with: N/A Relief from Meds: Good  Family History  Problem Relation Age of Onset   Cancer Father    CAD Brother    Hypertension Brother    Depression Maternal Uncle    Alcohol abuse Maternal Uncle    Stroke Neg Hx    Social History   Socioeconomic History   Marital status: Married    Spouse name: [name]   Number of children: 2   Years of education: 14   Highest education level: Tax Adviser degree: occupational, scientist, product/process development, or vocational program  Occupational History   Occupation: Full time    Comment: Industrial/product designer for [employer]   Occupation: Retired Industrial/product Designer    Comment: [employer]  Tobacco Use   Smoking status: Former    Current packs/day: 0.00    Types: Cigarettes    Start date: 10/25/1966    Quit date: 10/25/1998    Years since quitting: 25.9    Passive exposure: Past   Smokeless tobacco: Never  Vaping Use   Vaping status: Never Used  Substance and Sexual Activity   Alcohol use: Not Currently   Drug use: Not Currently   Sexual activity: Yes  Other Topics Concern   Not on file  Social History Narrative   Lives in [place] with wife. Enjoys reading, playing golf in free time.    Pt retired    Social Drivers of Health   Tobacco Use:  Medium Risk (10/12/2024)   Patient History    Smoking Tobacco Use: Former    Smokeless Tobacco Use: Never    Passive Exposure: Past  Physicist, Medical Strain: Low Risk (10/28/2023)   Overall Financial Resource Strain (CARDIA)    Difficulty of Paying Living Expenses: Not very hard  Food Insecurity: No Food Insecurity (10/09/2024)   Epic    Worried About Programme Researcher, Broadcasting/film/video in the Last Year: Never true    Ran Out of Food in the Last Year: Never true  Transportation Needs: No Transportation Needs (10/09/2024)   Epic    Lack of Transportation (Medical): No    Lack of Transportation (Non-Medical): No  Physical Activity: Sufficiently Active (10/09/2024)   Exercise Vital Sign    Days of Exercise per Week: 7 days    Minutes of Exercise per Session: 30 min  Stress: No Stress Concern Present (10/09/2024)   [employer] of Occupational Health - Occupational Stress Questionnaire    Feeling of Stress: Not at all  Social Connections: Moderately Integrated (10/09/2024)   Social Connection and Isolation Panel    Frequency of Communication with Friends and Family: More than three times a week    Frequency of Social Gatherings  with Friends and Family: Once a week    Attends Religious Services: Never    Active Member of Clubs or Organizations: Yes    Attends Engineer, Structural: More than 4 times per year    Marital Status: Married  Depression (PHQ2-9): Low Risk (10/09/2024)   Depression (PHQ2-9)    PHQ-2 Score: 0  Alcohol Screen: Medium Risk (04/03/2023)   Alcohol Screen    Last Alcohol Screening Score (AUDIT): 13  Housing: Low Risk (10/09/2024)   Epic    Unable to Pay for Housing in the Last Year: No    Number of Times Moved in the Last Year: 0    Homeless in the Last Year: No  Utilities: Not At Risk (10/09/2024)   Epic    Threatened with loss of utilities: No  Health Literacy: Adequate Health Literacy (10/09/2024)   B1300 Health Literacy    Frequency of need for help with medical  instructions: Never   Past Surgical History:  Procedure Laterality Date   CORONARY ATHERECTOMY N/A 04/26/2020   Procedure: CORONARY ATHERECTOMY;  Surgeon: Dann Candyce RAMAN, MD;  Location: [facility] INVASIVE CV LAB;  Service: Cardiovascular;  Laterality: N/A;   CORONARY STENT INTERVENTION N/A 04/26/2020   Procedure: CORONARY STENT INTERVENTION;  Surgeon: Dann Candyce RAMAN, MD;  Location: [facility] INVASIVE CV LAB;  Service: Cardiovascular;  Laterality: N/A;   CORONARY ULTRASOUND/IVUS N/A 04/26/2020   Procedure: Intravascular Ultrasound/IVUS;  Surgeon: Dann Candyce RAMAN, MD;  Location: [facility] INVASIVE CV LAB;  Service: Cardiovascular;  Laterality: N/A;   Insertion of left subclavian Port-A-Cath  2008   LAPAROSCOPY N/A 03/20/2024   Procedure: LAPAROSCOPY, DIAGNOSTIC;  Surgeon: Dasie Leonor CROME, MD;  Location: [facility] OR;  Service: General;  Laterality: N/A;   LEFT HEART CATH AND CORONARY ANGIOGRAPHY N/A 07/03/2022   Procedure: LEFT HEART CATH AND CORONARY ANGIOGRAPHY;  Surgeon: Jordan, Peter M, MD;  Location: [facility] INVASIVE CV LAB;  Service: Cardiovascular;  Laterality: N/A;   RIGHT/LEFT HEART CATH AND CORONARY ANGIOGRAPHY N/A 04/26/2020   Procedure: RIGHT/LEFT HEART CATH AND CORONARY ANGIOGRAPHY;  Surgeon: Dann Candyce RAMAN, MD;  Location: [facility] INVASIVE CV LAB;  Service: Cardiovascular;  Laterality: N/A;   TEMPORARY PACEMAKER N/A 04/26/2020   Procedure: TEMPORARY PACEMAKER;  Surgeon: Dann Candyce RAMAN, MD;  Location: [facility] INVASIVE CV LAB;  Service: Cardiovascular;  Laterality: N/A;   VENTRICULOPERITONEAL SHUNT N/A 03/20/2024   Procedure: SHUNT INSERTION VENTRICULAR-PERITONEAL;  Surgeon: Lanis Pupa, MD;  Location: [facility] OR;  Service: Neurosurgery;  Laterality: N/A;  LAPAROSCOPIC ASSISTED VP SHUNT PLACEMENT (DOW CO-SURGEON)   Video bronchoscopy and video mediastinoscopy  2008   Past Surgical History:  Procedure Laterality Date   CORONARY ATHERECTOMY N/A 04/26/2020   Procedure: CORONARY ATHERECTOMY;  Surgeon:  Dann Candyce RAMAN, MD;  Location: [facility] INVASIVE CV LAB;  Service: Cardiovascular;  Laterality: N/A;   CORONARY STENT INTERVENTION N/A 04/26/2020   Procedure: CORONARY STENT INTERVENTION;  Surgeon: Dann Candyce RAMAN, MD;  Location: [facility] INVASIVE CV LAB;  Service: Cardiovascular;  Laterality: N/A;   CORONARY ULTRASOUND/IVUS N/A 04/26/2020   Procedure: Intravascular Ultrasound/IVUS;  Surgeon: Dann Candyce RAMAN, MD;  Location: [facility] INVASIVE CV LAB;  Service: Cardiovascular;  Laterality: N/A;   Insertion of left subclavian Port-A-Cath  2008   LAPAROSCOPY N/A 03/20/2024   Procedure: LAPAROSCOPY, DIAGNOSTIC;  Surgeon: Dasie Leonor CROME, MD;  Location: [facility] OR;  Service: General;  Laterality: N/A;   LEFT HEART CATH AND CORONARY ANGIOGRAPHY N/A 07/03/2022   Procedure: LEFT HEART CATH AND CORONARY ANGIOGRAPHY;  Surgeon: Jordan,  Maude HERO, MD;  Location: [facility] INVASIVE CV LAB;  Service: Cardiovascular;  Laterality: N/A;   RIGHT/LEFT HEART CATH AND CORONARY ANGIOGRAPHY N/A 04/26/2020   Procedure: RIGHT/LEFT HEART CATH AND CORONARY ANGIOGRAPHY;  Surgeon: Dann Candyce RAMAN, MD;  Location: [facility] INVASIVE CV LAB;  Service: Cardiovascular;  Laterality: N/A;   TEMPORARY PACEMAKER N/A 04/26/2020   Procedure: TEMPORARY PACEMAKER;  Surgeon: Dann Candyce RAMAN, MD;  Location: [facility] INVASIVE CV LAB;  Service: Cardiovascular;  Laterality: N/A;   VENTRICULOPERITONEAL SHUNT N/A 03/20/2024   Procedure: SHUNT INSERTION VENTRICULAR-PERITONEAL;  Surgeon: Lanis Pupa, MD;  Location: [facility] OR;  Service: Neurosurgery;  Laterality: N/A;  LAPAROSCOPIC ASSISTED VP SHUNT PLACEMENT (DOW CO-SURGEON)   Video bronchoscopy and video mediastinoscopy  2008   Past Medical History:  Diagnosis Date   CAD (coronary artery disease), native coronary artery    DES proximal LAD 07/2009; DES to RCA 04/2020   Chronic bronchitis (HCC)    Chronic heart failure with preserved ejection fraction (HFpEF) (HCC)    Essential hypertension    Habitual alcohol use     pt has not drank alcohol since stroke in 06/2023   History of kidney stones    Hyperlipidemia    Ocular migraine    PVC's (premature ventricular contractions)    Small cell lung cancer (HCC)    Right upper lobe 2008; no evidence of disease since treatment ended in 11/2007   Stroke (HCC) 07/05/2023   BP (!) 159/84 (BP Location: Left Arm, Patient Position: Sitting, Cuff Size: Large)   Pulse 74   Ht 5' 10 (1.778 m)   Wt 217 lb 9.6 oz (98.7 kg)   SpO2 95%   BMI 31.22 kg/m   Opioid Risk Score:   Fall Risk Score:  `1  Depression screen PHQ 2/9     10/09/2024    4:05 PM 07/15/2024    9:59 AM 05/01/2024    1:55 PM 03/26/2024    1:45 PM 03/23/2024    9:52 AM 03/11/2024    1:09 PM 01/02/2024    4:07 PM  Depression screen PHQ 2/9  Decreased Interest 0 0 0 0 0 0 0  Down, Depressed, Hopeless 0 0 0 0 0 0 0  PHQ - 2 Score 0 0 0 0 0 0 0  Altered sleeping 0 0 0 0  0 0  Tired, decreased energy 0 0 0 0  0 0  Change in appetite 0 0 0 0  0 0  Feeling bad or failure about yourself  0 0 0 0  0 0  Trouble concentrating 0 0 0 0  0 0  Moving slowly or fidgety/restless 0 0 0 0  0 0  Suicidal thoughts 0 0 0 0  0 0  PHQ-9 Score 0 0  0  0   0  0   Difficult doing work/chores  Not difficult at all Not difficult at all Not difficult at all  Not difficult at all Not difficult at all     Data saved with a previous flowsheet row definition     Jeremy Lopez is a 74 year old man who presents for follow-up of hypertension.   1) Hiccups -not that often  2) Dysphagia -has some difficulty swallowing  3) Depression -has a little -wife gives him vitamin D  every night  4) Incontinence -urinary   5) HTN -cardiologist increased his irbesartan  to 300mg  -has communicated with his cardiologist -he takes flomax  0.4mg  at night -eats a pretty healthy diet -his wife helps  his daughter babysitting -eats salmon  6) Impaired memory   Review of Systems     Objective:   Physical Exam Gen: no distress,  normal appearing HEENT: oral mucosa pink and moist, NCAT Cardio: Reg rate Chest: normal effort, normal rate of breathing Abd: soft, non-distended Ext: no edema Psych: pleasant, normal affect Skin: intact Neuro: Alert and oriented x3, ambulating without AD, answers questions with good medical knowledge, stable 10/12/24     Assessment & Plan:   1) Urinary urgency/incontinence: -flomax  prescribed, continue  2) HTN -flomax  prescribed, continue -discussed that he is taking irbesartan , advised increasing this dose to 300mg  as his cardiologist recommends rather than restarting diltiazem .  -discussed that BP increases to SBP 170s/180s at night, discussed that he   3) Depression:  -discussed that this is better -discussed that he enjoys watching TV -discussed that they have a treadmill in their basement -encouraged walking outside -discussed that it is related to everybody feeling sorry for him. -refilled Lexapro  -discussed vitamin D  can be helpful  4) Impaired memory: -continue crosswords -discussed outpatient SLP  5) Overweight: -discussed that he has lost 20 lbs -discussed risks and benefits of berberine and metformin   -discussed his goal is to get to 185 lbs -discussed not forcing himself to eat when he is not hungry -discussed prioritizing protein -avoid processed foods  -discussed that goal has been stable -discussed that stopping beer and soda likely greatly helped  6) CVA: -advised walking after meals -vitamin D  level ordered -discussed that activity has been normal but his wife feels he needs more exercise -discussed benefits of walking outside  7) Dysphagia:  -discussed that this has resolved -discussed swallowing difficulties but that it is not a significant problem.  -advised chewing foods cautiously  8) Recurrent sinus infections: -prescribed Tussionex prn -recommended olive leaf extract -continue Zinc      "

## 2024-10-28 ENCOUNTER — Other Ambulatory Visit: Payer: Self-pay | Admitting: Nurse Practitioner

## 2024-10-30 ENCOUNTER — Ambulatory Visit

## 2024-11-02 ENCOUNTER — Ambulatory Visit: Payer: Self-pay

## 2024-11-09 ENCOUNTER — Other Ambulatory Visit: Payer: Self-pay

## 2024-11-09 ENCOUNTER — Ambulatory Visit: Payer: Self-pay

## 2024-11-09 ENCOUNTER — Telehealth

## 2024-11-09 MED ORDER — AZITHROMYCIN 250 MG PO TABS: ORAL_TABLET | ORAL | 0 refills | Status: AC

## 2024-11-09 MED ORDER — METHYLPREDNISOLONE 4 MG PO TBPK: ORAL_TABLET | ORAL | 0 refills | Status: AC

## 2024-11-13 NOTE — Progress Notes (Signed)
 This encounter was created in error - please disregard.

## 2025-02-04 ENCOUNTER — Ambulatory Visit

## 2025-10-12 ENCOUNTER — Encounter: Admitting: Physical Medicine and Rehabilitation

## 2025-10-12 ENCOUNTER — Ambulatory Visit: Payer: Self-pay
